# Patient Record
Sex: Male | Born: 1945
Health system: Southern US, Community
[De-identification: ages and names within clinical notes are randomized; demographics above are authoritative.]

## PROBLEM LIST (undated history)

## (undated) DIAGNOSIS — E079 Disorder of thyroid, unspecified: Secondary | ICD-10-CM

## (undated) DIAGNOSIS — I639 Cerebral infarction, unspecified: Secondary | ICD-10-CM

## (undated) DIAGNOSIS — J449 Chronic obstructive pulmonary disease, unspecified: Secondary | ICD-10-CM

## (undated) DIAGNOSIS — K219 Gastro-esophageal reflux disease without esophagitis: Secondary | ICD-10-CM

## (undated) DIAGNOSIS — J40 Bronchitis, not specified as acute or chronic: Secondary | ICD-10-CM

## (undated) DIAGNOSIS — I10 Essential (primary) hypertension: Secondary | ICD-10-CM

## (undated) DIAGNOSIS — M1990 Unspecified osteoarthritis, unspecified site: Secondary | ICD-10-CM

## (undated) DIAGNOSIS — I82409 Acute embolism and thrombosis of unspecified deep veins of unspecified lower extremity: Secondary | ICD-10-CM

## (undated) DIAGNOSIS — G459 Transient cerebral ischemic attack, unspecified: Secondary | ICD-10-CM

## (undated) DIAGNOSIS — E785 Hyperlipidemia, unspecified: Secondary | ICD-10-CM

## (undated) DIAGNOSIS — E039 Hypothyroidism, unspecified: Secondary | ICD-10-CM

## (undated) DIAGNOSIS — B351 Tinea unguium: Secondary | ICD-10-CM

## (undated) HISTORY — PX: TONSILLECTOMY: SUR1361

## (undated) HISTORY — DX: Cerebral infarction, unspecified: I63.9

## (undated) HISTORY — PX: APPENDECTOMY: SHX54

## (undated) HISTORY — DX: Unspecified osteoarthritis, unspecified site: M19.90

## (undated) HISTORY — PX: NASAL SINUS SURGERY: SHX719

## (undated) HISTORY — PX: PR VEIN BYPASS GRAFT,AORTO-FEM-POP: 35551

## (undated) HISTORY — PX: COLONOSCOPY: SHX174

## (undated) HISTORY — DX: Acute embolism and thrombosis of unspecified deep veins of unspecified lower extremity: I82.409

## (undated) HISTORY — PX: OTHER SURGICAL HISTORY: SHX169

## (undated) HISTORY — DX: Tinea unguium: B35.1

## (undated) HISTORY — DX: Hyperlipidemia, unspecified: E78.5

## (undated) HISTORY — DX: Disorder of thyroid, unspecified: E07.9

## (undated) HISTORY — DX: Transient cerebral ischemic attack, unspecified: G45.9

---

## 1999-04-06 ENCOUNTER — Ambulatory Visit (HOSPITAL_COMMUNITY): Admission: RE | Admit: 1999-04-06 | Discharge: 1999-04-06 | Payer: Self-pay | Admitting: Gastroenterology

## 1999-10-13 ENCOUNTER — Encounter: Payer: Self-pay | Admitting: Emergency Medicine

## 1999-10-13 ENCOUNTER — Inpatient Hospital Stay (HOSPITAL_COMMUNITY): Admission: EM | Admit: 1999-10-13 | Discharge: 1999-10-14 | Payer: Self-pay | Admitting: Orthopedic Surgery

## 1999-10-13 ENCOUNTER — Encounter: Payer: Self-pay | Admitting: Orthopedic Surgery

## 1999-11-13 ENCOUNTER — Inpatient Hospital Stay (HOSPITAL_COMMUNITY): Admission: EM | Admit: 1999-11-13 | Discharge: 1999-11-19 | Payer: Self-pay | Admitting: Orthopedic Surgery

## 1999-11-13 ENCOUNTER — Encounter: Payer: Self-pay | Admitting: Orthopedic Surgery

## 2000-05-02 ENCOUNTER — Ambulatory Visit (HOSPITAL_COMMUNITY): Admission: RE | Admit: 2000-05-02 | Discharge: 2000-05-02 | Payer: Self-pay | Admitting: Family Medicine

## 2000-08-07 ENCOUNTER — Encounter: Payer: Self-pay | Admitting: Vascular Surgery

## 2000-08-08 ENCOUNTER — Ambulatory Visit: Admission: RE | Admit: 2000-08-08 | Discharge: 2000-08-08 | Payer: Self-pay | Admitting: Vascular Surgery

## 2001-01-15 ENCOUNTER — Ambulatory Visit: Admission: RE | Admit: 2001-01-15 | Discharge: 2001-01-15 | Payer: Self-pay | Admitting: Vascular Surgery

## 2001-01-15 ENCOUNTER — Encounter: Payer: Self-pay | Admitting: Vascular Surgery

## 2001-01-17 ENCOUNTER — Encounter (HOSPITAL_COMMUNITY): Admission: RE | Admit: 2001-01-17 | Discharge: 2001-04-17 | Payer: Self-pay | Admitting: Family Medicine

## 2001-01-22 ENCOUNTER — Encounter: Admission: RE | Admit: 2001-01-22 | Discharge: 2001-04-22 | Payer: Self-pay | Admitting: Family Medicine

## 2002-12-13 ENCOUNTER — Inpatient Hospital Stay (HOSPITAL_COMMUNITY): Admission: EM | Admit: 2002-12-13 | Discharge: 2002-12-15 | Payer: Self-pay | Admitting: Emergency Medicine

## 2002-12-13 ENCOUNTER — Encounter: Payer: Self-pay | Admitting: Internal Medicine

## 2005-02-19 ENCOUNTER — Emergency Department (HOSPITAL_COMMUNITY): Admission: EM | Admit: 2005-02-19 | Discharge: 2005-02-19 | Payer: Self-pay | Admitting: Family Medicine

## 2005-02-21 ENCOUNTER — Encounter: Admission: RE | Admit: 2005-02-21 | Discharge: 2005-02-21 | Payer: Self-pay | Admitting: Occupational Medicine

## 2005-03-16 ENCOUNTER — Inpatient Hospital Stay (HOSPITAL_COMMUNITY): Admission: EM | Admit: 2005-03-16 | Discharge: 2005-03-19 | Payer: Self-pay | Admitting: Emergency Medicine

## 2005-05-10 ENCOUNTER — Ambulatory Visit (HOSPITAL_COMMUNITY): Admission: RE | Admit: 2005-05-10 | Discharge: 2005-05-10 | Payer: Self-pay | Admitting: Gastroenterology

## 2005-05-15 ENCOUNTER — Emergency Department (HOSPITAL_COMMUNITY): Admission: EM | Admit: 2005-05-15 | Discharge: 2005-05-16 | Payer: Self-pay | Admitting: Emergency Medicine

## 2005-08-08 ENCOUNTER — Ambulatory Visit (HOSPITAL_COMMUNITY): Admission: RE | Admit: 2005-08-08 | Discharge: 2005-08-08 | Payer: Self-pay | Admitting: Vascular Surgery

## 2005-08-11 ENCOUNTER — Inpatient Hospital Stay (HOSPITAL_COMMUNITY): Admission: RE | Admit: 2005-08-11 | Discharge: 2005-08-17 | Payer: Self-pay | Admitting: Vascular Surgery

## 2006-12-25 ENCOUNTER — Inpatient Hospital Stay (HOSPITAL_COMMUNITY): Admission: EM | Admit: 2006-12-25 | Discharge: 2006-12-29 | Payer: Self-pay | Admitting: Emergency Medicine

## 2008-01-24 ENCOUNTER — Inpatient Hospital Stay (HOSPITAL_COMMUNITY): Admission: EM | Admit: 2008-01-24 | Discharge: 2008-02-01 | Payer: Self-pay | Admitting: Emergency Medicine

## 2008-01-24 ENCOUNTER — Ambulatory Visit: Payer: Self-pay | Admitting: Critical Care Medicine

## 2009-02-25 IMAGING — CR DG CHEST 2V
2 series · 2 of 2 positions shown · non-contrast
Comparison: 12/25/2006

CLINICAL DATA: Cough. Dyspnea. Ex-smoker.

CHEST - 2 VIEW

[w chest pa]
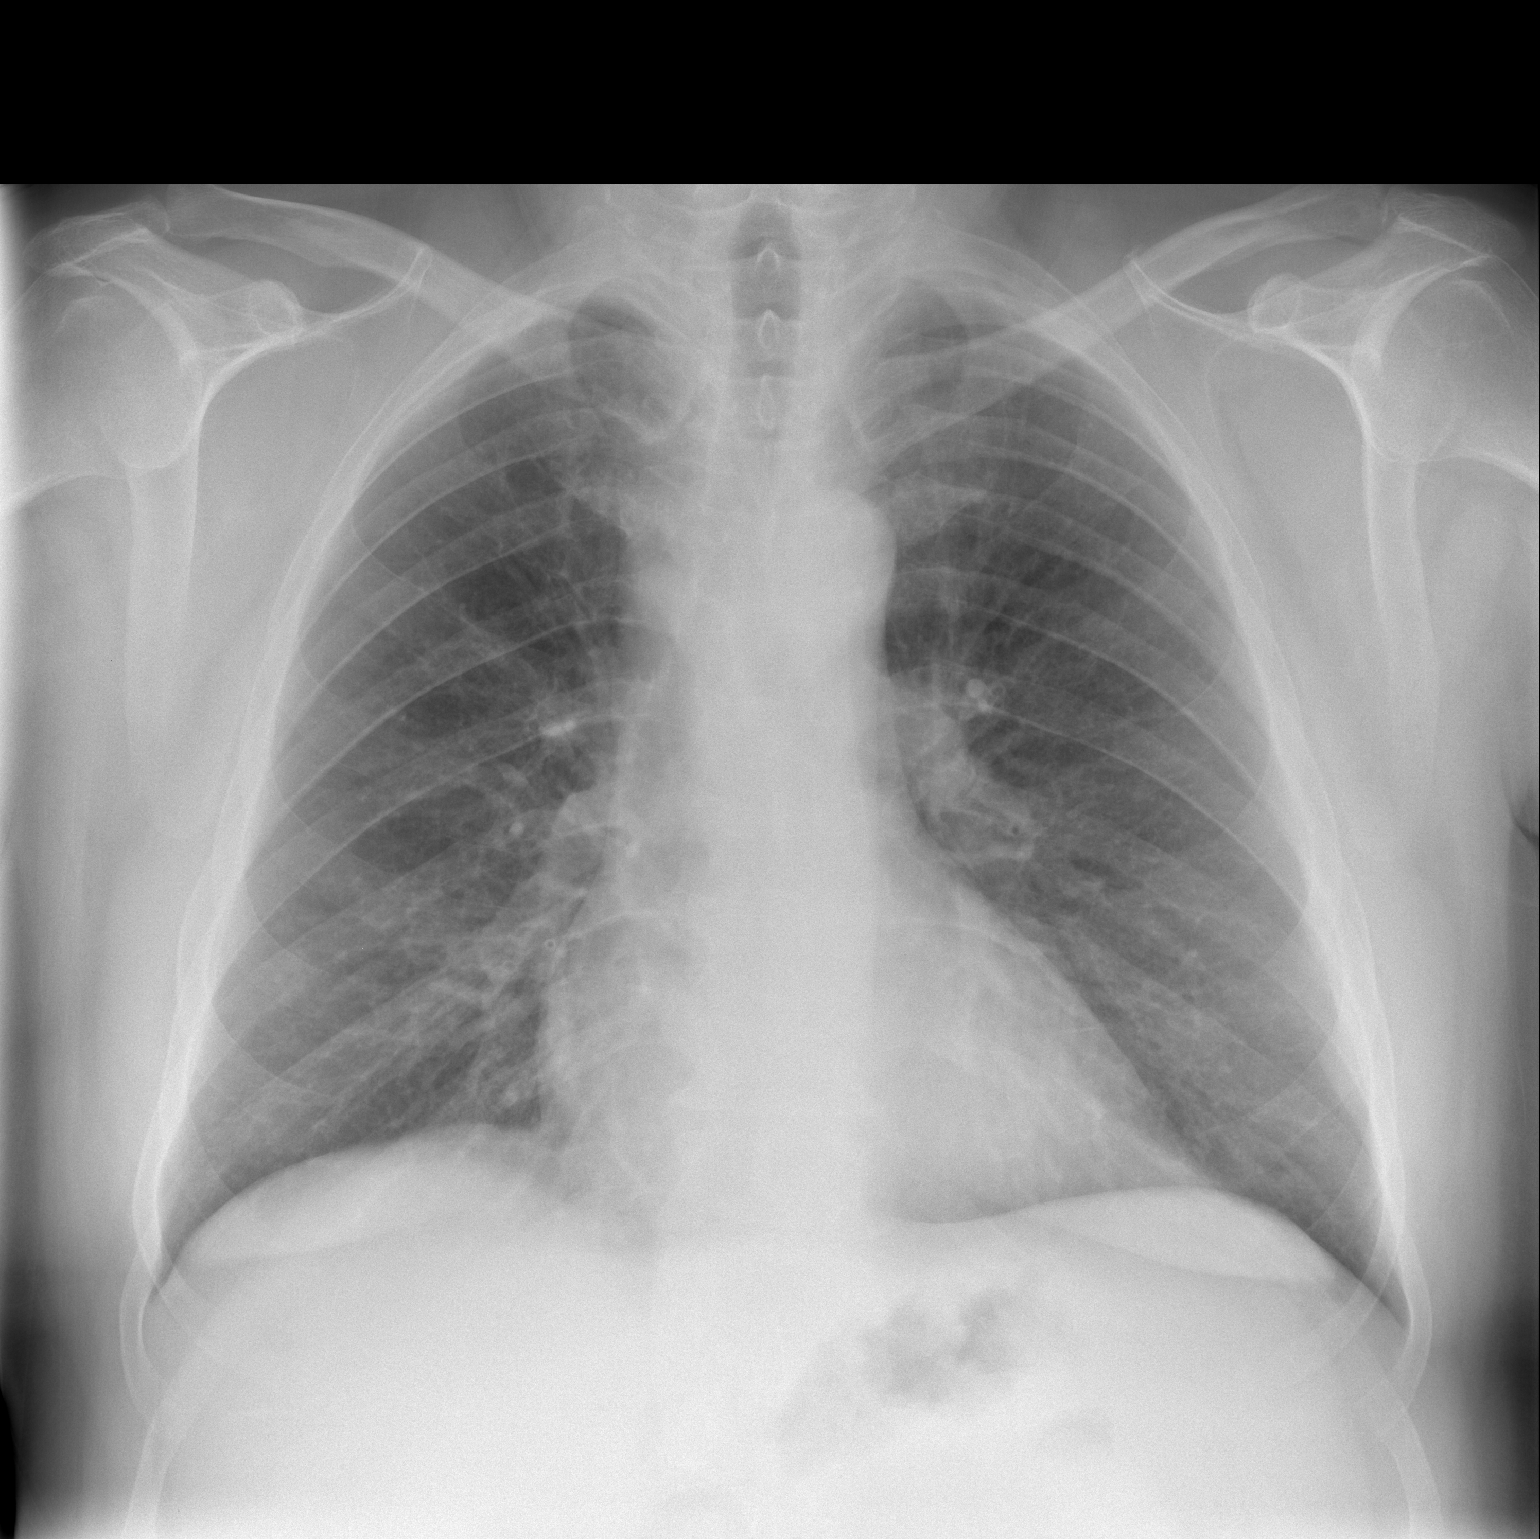

[w chest lat]
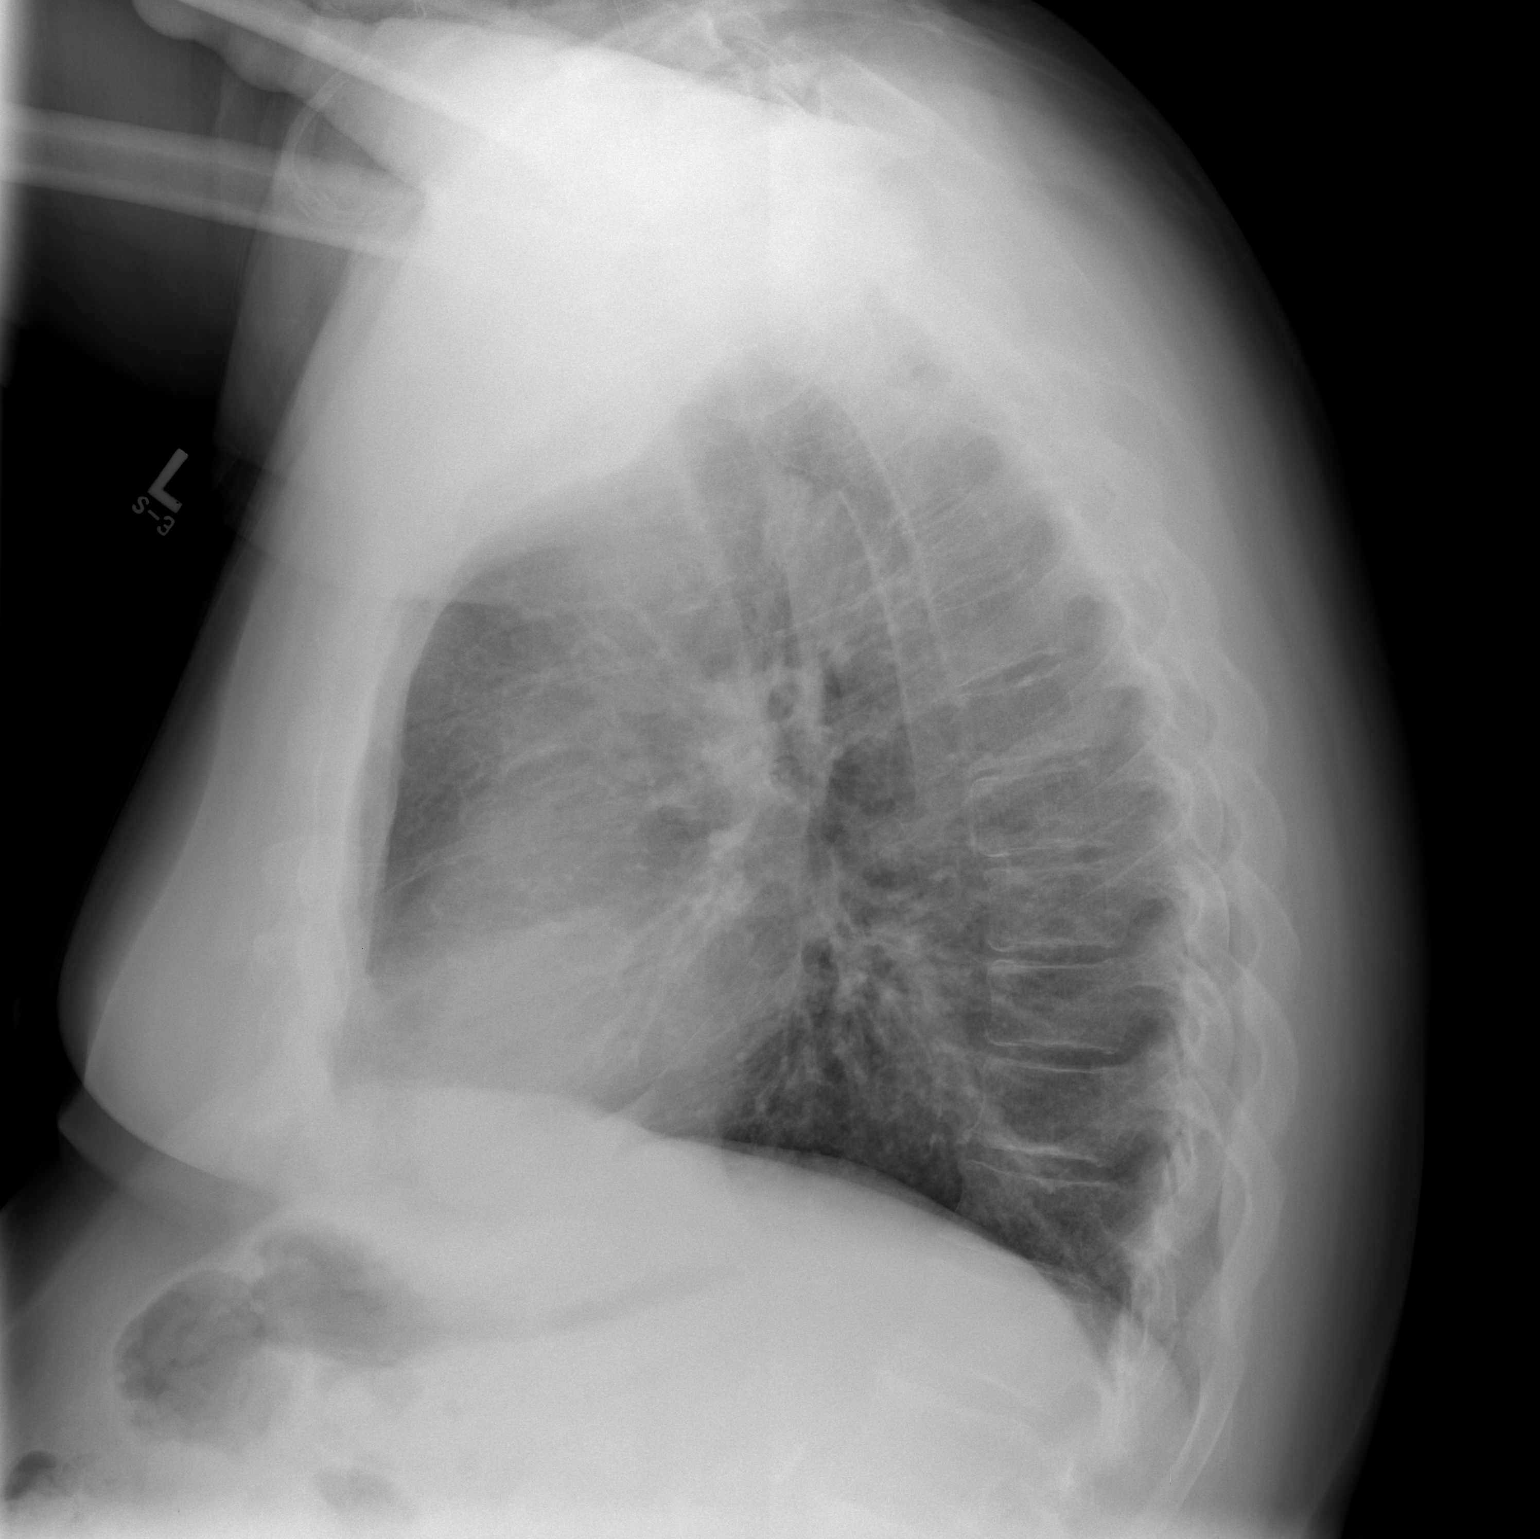

[2 of 2 positions shown; findings below may reference images not displayed]

FINDINGS: Midline trachea. Normal heart size and mediastinal contours. No
pleural effusion or pneumothorax. Mild peribronchial thickening. Clear lungs.

IMPRESSION

1. No acute cardiopulmonary disease.
2. Mild peribronchial thickening likely related to chronic bronchitis or
smoking.

## 2009-12-12 HISTORY — PX: JOINT REPLACEMENT: SHX530

## 2010-03-25 ENCOUNTER — Encounter: Admission: RE | Admit: 2010-03-25 | Discharge: 2010-03-25 | Payer: Self-pay | Admitting: Family Medicine

## 2011-01-28 ENCOUNTER — Emergency Department (HOSPITAL_COMMUNITY): Payer: Medicare Other

## 2011-01-28 ENCOUNTER — Encounter (HOSPITAL_COMMUNITY): Payer: Self-pay | Admitting: Radiology

## 2011-01-28 ENCOUNTER — Inpatient Hospital Stay (HOSPITAL_COMMUNITY)
Admission: EM | Admit: 2011-01-28 | Discharge: 2011-01-31 | DRG: 316 | Disposition: A | Discharge status: Home / Self Care | Payer: Medicare Other | Source: Ambulatory Visit | Attending: Internal Medicine | Admitting: Internal Medicine

## 2011-01-28 DIAGNOSIS — Z23 Encounter for immunization: Secondary | ICD-10-CM

## 2011-01-28 DIAGNOSIS — E119 Type 2 diabetes mellitus without complications: Secondary | ICD-10-CM | POA: Diagnosis present

## 2011-01-28 DIAGNOSIS — E669 Obesity, unspecified: Secondary | ICD-10-CM | POA: Diagnosis present

## 2011-01-28 DIAGNOSIS — I429 Cardiomyopathy, unspecified: Principal | ICD-10-CM | POA: Diagnosis present

## 2011-01-28 DIAGNOSIS — I509 Heart failure, unspecified: Secondary | ICD-10-CM | POA: Diagnosis present

## 2011-01-28 DIAGNOSIS — I672 Cerebral atherosclerosis: Secondary | ICD-10-CM | POA: Diagnosis present

## 2011-01-28 DIAGNOSIS — E039 Hypothyroidism, unspecified: Secondary | ICD-10-CM | POA: Diagnosis present

## 2011-01-28 DIAGNOSIS — I739 Peripheral vascular disease, unspecified: Secondary | ICD-10-CM | POA: Diagnosis present

## 2011-01-28 DIAGNOSIS — J449 Chronic obstructive pulmonary disease, unspecified: Secondary | ICD-10-CM | POA: Diagnosis present

## 2011-01-28 DIAGNOSIS — Z7982 Long term (current) use of aspirin: Secondary | ICD-10-CM

## 2011-01-28 DIAGNOSIS — E785 Hyperlipidemia, unspecified: Secondary | ICD-10-CM | POA: Diagnosis present

## 2011-01-28 DIAGNOSIS — J4489 Other specified chronic obstructive pulmonary disease: Secondary | ICD-10-CM | POA: Diagnosis present

## 2011-01-28 DIAGNOSIS — F17200 Nicotine dependence, unspecified, uncomplicated: Secondary | ICD-10-CM | POA: Diagnosis present

## 2011-01-28 DIAGNOSIS — I10 Essential (primary) hypertension: Secondary | ICD-10-CM | POA: Diagnosis present

## 2011-01-28 DIAGNOSIS — I6529 Occlusion and stenosis of unspecified carotid artery: Secondary | ICD-10-CM | POA: Diagnosis present

## 2011-01-28 HISTORY — DX: Chronic obstructive pulmonary disease, unspecified: J44.9

## 2011-01-28 HISTORY — DX: Essential (primary) hypertension: I10

## 2011-01-28 LAB — CBC
HCT: 43.3 % (ref 39.0–52.0)
Hemoglobin: 14.1 g/dL (ref 13.0–17.0)
MCH: 27 pg (ref 26.0–34.0)
MCHC: 32.6 g/dL (ref 30.0–36.0)
MCV: 83 fL (ref 78.0–100.0)
Platelets: 189 10*3/uL (ref 150–400)
RBC: 5.22 MIL/uL (ref 4.22–5.81)
RDW: 14.7 % (ref 11.5–15.5)
WBC: 9.6 10*3/uL (ref 4.0–10.5)

## 2011-01-28 LAB — BASIC METABOLIC PANEL
BUN: 17 mg/dL (ref 6–23)
CO2: 23 mEq/L (ref 19–32)
Calcium: 9.6 mg/dL (ref 8.4–10.5)
Chloride: 107 mEq/L (ref 96–112)
Creatinine, Ser: 1.2 mg/dL (ref 0.4–1.5)
GFR calc Af Amer: >60 mL/min (ref >60)
GFR calc non Af Amer: >60 mL/min (ref >60)
Glucose, Bld: 177 mg/dL — ABNORMAL HIGH (ref 70–99)
Potassium: 4.7 mEq/L (ref 3.5–5.1)
Sodium: 142 mEq/L (ref 135–145)

## 2011-01-28 LAB — DIFFERENTIAL
Basophils Absolute: 0.1 10*3/uL (ref 0.0–0.1)
Basophils Relative: 1 % (ref 0–1)
Eosinophils Absolute: 0.3 10*3/uL (ref 0.0–0.7)
Eosinophils Relative: 3 % (ref 0–5)
Lymphocytes Relative: 34 % (ref 12–46)
Lymphs Abs: 3.2 10*3/uL (ref 0.7–4.0)
Monocytes Absolute: 0.8 10*3/uL (ref 0.1–1.0)
Monocytes Relative: 8 % (ref 3–12)
Neutro Abs: 5.2 10*3/uL (ref 1.7–7.7)
Neutrophils Relative %: 55 % (ref 43–77)

## 2011-01-28 LAB — GLUCOSE, CAPILLARY
Glucose-Capillary: 129 mg/dL — ABNORMAL HIGH (ref 70–99)
Glucose-Capillary: 97 mg/dL (ref 70–99)

## 2011-01-28 LAB — CK TOTAL AND CKMB (NOT AT ARMC)
CK, MB: 3 ng/mL (ref 0.3–4.0)
Relative Index: INVALID (ref 0.0–2.5)
Total CK: 72 U/L (ref 7–232)

## 2011-01-28 LAB — TROPONIN I: Troponin I: 0.01 ng/mL (ref 0.00–0.06)

## 2011-01-28 MED ORDER — IOHEXOL 350 MG/ML SOLN
100.0000 mL | Freq: Once | INTRAVENOUS | Status: AC | PRN
  Administered 2011-01-28: 100 mL via INTRAVENOUS

## 2011-01-29 LAB — GLUCOSE, CAPILLARY
Glucose-Capillary: 130 mg/dL — ABNORMAL HIGH (ref 70–99)
Glucose-Capillary: 159 mg/dL — ABNORMAL HIGH (ref 70–99)
Glucose-Capillary: 297 mg/dL — ABNORMAL HIGH (ref 70–99)
Glucose-Capillary: 317 mg/dL — ABNORMAL HIGH (ref 70–99)
Glucose-Capillary: 60 mg/dL — ABNORMAL LOW (ref 70–99)
Glucose-Capillary: 84 mg/dL (ref 70–99)

## 2011-01-29 LAB — HEMOGLOBIN A1C
Hgb A1c MFr Bld: 10.4 % — ABNORMAL HIGH (ref <5.7)
Mean Plasma Glucose: 252 mg/dL — ABNORMAL HIGH (ref <117)

## 2011-01-29 LAB — COMPREHENSIVE METABOLIC PANEL
ALT: 25 U/L (ref 0–53)
AST: 21 U/L (ref 0–37)
Albumin: 3.2 g/dL — ABNORMAL LOW (ref 3.5–5.2)
Alkaline Phosphatase: 74 U/L (ref 39–117)
BUN: 13 mg/dL (ref 6–23)
CO2: 27 mEq/L (ref 19–32)
Calcium: 9.2 mg/dL (ref 8.4–10.5)
Chloride: 107 mEq/L (ref 96–112)
Creatinine, Ser: 1.21 mg/dL (ref 0.4–1.5)
GFR calc Af Amer: >60 mL/min (ref >60)
GFR calc non Af Amer: >60 mL/min (ref >60)
Glucose, Bld: 255 mg/dL — ABNORMAL HIGH (ref 70–99)
Potassium: 4.7 mEq/L (ref 3.5–5.1)
Sodium: 141 mEq/L (ref 135–145)
Total Bilirubin: 0.2 mg/dL — ABNORMAL LOW (ref 0.3–1.2)
Total Protein: 5.9 g/dL — ABNORMAL LOW (ref 6.0–8.3)

## 2011-01-29 LAB — CARDIAC PANEL(CRET KIN+CKTOT+MB+TROPI)
CK, MB: 2.8 ng/mL (ref 0.3–4.0)
CK, MB: 2.4 ng/mL (ref 0.3–4.0)
Relative Index: INVALID (ref 0.0–2.5)
Relative Index: INVALID (ref 0.0–2.5)
Total CK: 76 U/L (ref 7–232)
Total CK: 59 U/L (ref 7–232)
Troponin I: 0.01 ng/mL (ref 0.00–0.06)
Troponin I: 0.01 ng/mL (ref 0.00–0.06)

## 2011-01-29 LAB — CBC
HCT: 40.3 % (ref 39.0–52.0)
Hemoglobin: 12.8 g/dL — ABNORMAL LOW (ref 13.0–17.0)
MCH: 26.7 pg (ref 26.0–34.0)
MCHC: 31.8 g/dL (ref 30.0–36.0)
MCV: 84 fL (ref 78.0–100.0)
Platelets: 184 10*3/uL (ref 150–400)
RBC: 4.8 MIL/uL (ref 4.22–5.81)
RDW: 14.9 % (ref 11.5–15.5)
WBC: 10.6 10*3/uL — ABNORMAL HIGH (ref 4.0–10.5)

## 2011-01-29 LAB — LIPID PANEL
Cholesterol: 117 mg/dL (ref 0–200)
HDL: 35 mg/dL — ABNORMAL LOW (ref >39)
LDL Cholesterol: 54 mg/dL (ref 0–99)
Total CHOL/HDL Ratio: 3.3 RATIO
Triglycerides: 142 mg/dL (ref <150)
VLDL: 28 mg/dL (ref 0–40)

## 2011-01-29 LAB — PROTIME-INR
INR: 1.01 (ref 0.00–1.49)
Prothrombin Time: 13.5 seconds (ref 11.6–15.2)

## 2011-01-29 LAB — APTT: aPTT: 30 seconds (ref 24–37)

## 2011-01-30 LAB — GLUCOSE, CAPILLARY
Glucose-Capillary: 258 mg/dL — ABNORMAL HIGH (ref 70–99)
Glucose-Capillary: 296 mg/dL — ABNORMAL HIGH (ref 70–99)
Glucose-Capillary: 395 mg/dL — ABNORMAL HIGH (ref 70–99)
Glucose-Capillary: 382 mg/dL — ABNORMAL HIGH (ref 70–99)
Glucose-Capillary: 404 mg/dL — ABNORMAL HIGH (ref 70–99)
Glucose-Capillary: 341 mg/dL — ABNORMAL HIGH (ref 70–99)

## 2011-01-31 LAB — GLUCOSE, CAPILLARY
Glucose-Capillary: 230 mg/dL — ABNORMAL HIGH (ref 70–99)
Glucose-Capillary: 282 mg/dL — ABNORMAL HIGH (ref 70–99)
Glucose-Capillary: 301 mg/dL — ABNORMAL HIGH (ref 70–99)
Glucose-Capillary: 266 mg/dL — ABNORMAL HIGH (ref 70–99)
Glucose-Capillary: 259 mg/dL — ABNORMAL HIGH (ref 70–99)

## 2011-02-08 ENCOUNTER — Emergency Department (HOSPITAL_COMMUNITY): Payer: Medicare Other

## 2011-02-08 ENCOUNTER — Inpatient Hospital Stay (HOSPITAL_COMMUNITY)
Admission: EM | Admit: 2011-02-08 | Discharge: 2011-02-10 | DRG: 055 | Disposition: A | Discharge status: Home / Self Care | Payer: Medicare Other | Attending: Neurology | Admitting: Neurology

## 2011-02-08 DIAGNOSIS — E119 Type 2 diabetes mellitus without complications: Secondary | ICD-10-CM | POA: Diagnosis present

## 2011-02-08 DIAGNOSIS — J4489 Other specified chronic obstructive pulmonary disease: Secondary | ICD-10-CM | POA: Diagnosis present

## 2011-02-08 DIAGNOSIS — I10 Essential (primary) hypertension: Secondary | ICD-10-CM | POA: Diagnosis present

## 2011-02-08 DIAGNOSIS — F17200 Nicotine dependence, unspecified, uncomplicated: Secondary | ICD-10-CM | POA: Diagnosis present

## 2011-02-08 DIAGNOSIS — J449 Chronic obstructive pulmonary disease, unspecified: Secondary | ICD-10-CM | POA: Diagnosis present

## 2011-02-08 DIAGNOSIS — D320 Benign neoplasm of cerebral meninges: Secondary | ICD-10-CM | POA: Diagnosis present

## 2011-02-08 DIAGNOSIS — E039 Hypothyroidism, unspecified: Secondary | ICD-10-CM | POA: Diagnosis present

## 2011-02-08 DIAGNOSIS — D333 Benign neoplasm of cranial nerves: Principal | ICD-10-CM | POA: Diagnosis present

## 2011-02-08 DIAGNOSIS — R279 Unspecified lack of coordination: Secondary | ICD-10-CM | POA: Diagnosis present

## 2011-02-08 DIAGNOSIS — F909 Attention-deficit hyperactivity disorder, unspecified type: Secondary | ICD-10-CM | POA: Diagnosis present

## 2011-02-08 DIAGNOSIS — E785 Hyperlipidemia, unspecified: Secondary | ICD-10-CM | POA: Diagnosis present

## 2011-02-08 DIAGNOSIS — G459 Transient cerebral ischemic attack, unspecified: Secondary | ICD-10-CM | POA: Diagnosis present

## 2011-02-08 LAB — URINALYSIS, ROUTINE W REFLEX MICROSCOPIC
Bilirubin Urine: NEGATIVE
Hgb urine dipstick: NEGATIVE
Ketones, ur: NEGATIVE mg/dL
Nitrite: NEGATIVE
Protein, ur: NEGATIVE mg/dL
Specific Gravity, Urine: 1.015 (ref 1.005–1.030)
Urine Glucose, Fasting: NEGATIVE mg/dL
Urobilinogen, UA: 0.2 mg/dL (ref 0.0–1.0)
pH: 5 (ref 5.0–8.0)

## 2011-02-08 LAB — CK TOTAL AND CKMB (NOT AT ARMC)
CK, MB: 3 ng/mL (ref 0.3–4.0)
Relative Index: INVALID (ref 0.0–2.5)
Total CK: 62 U/L (ref 7–232)

## 2011-02-08 LAB — CBC
HCT: 41.8 % (ref 39.0–52.0)
Hemoglobin: 13.7 g/dL (ref 13.0–17.0)
MCH: 26.8 pg (ref 26.0–34.0)
MCHC: 32.8 g/dL (ref 30.0–36.0)
MCV: 81.6 fL (ref 78.0–100.0)
Platelets: 204 10*3/uL (ref 150–400)
RBC: 5.12 MIL/uL (ref 4.22–5.81)
RDW: 14.3 % (ref 11.5–15.5)
WBC: 9.7 10*3/uL (ref 4.0–10.5)

## 2011-02-08 LAB — COMPREHENSIVE METABOLIC PANEL
ALT: 21 U/L (ref 0–53)
AST: 16 U/L (ref 0–37)
Albumin: 3.7 g/dL (ref 3.5–5.2)
Alkaline Phosphatase: 97 U/L (ref 39–117)
BUN: 24 mg/dL — ABNORMAL HIGH (ref 6–23)
CO2: 25 mEq/L (ref 19–32)
Calcium: 9.3 mg/dL (ref 8.4–10.5)
Chloride: 107 mEq/L (ref 96–112)
Creatinine, Ser: 1.21 mg/dL (ref 0.4–1.5)
GFR calc Af Amer: >60 mL/min (ref >60)
GFR calc non Af Amer: >60 mL/min (ref >60)
Glucose, Bld: 193 mg/dL — ABNORMAL HIGH (ref 70–99)
Potassium: 4.9 mEq/L (ref 3.5–5.1)
Sodium: 139 mEq/L (ref 135–145)
Total Bilirubin: 0.6 mg/dL (ref 0.3–1.2)
Total Protein: 6.4 g/dL (ref 6.0–8.3)

## 2011-02-08 LAB — TROPONIN I: Troponin I: <0.01 ng/mL (ref 0.00–0.06)

## 2011-02-08 LAB — APTT: aPTT: 31 seconds (ref 24–37)

## 2011-02-08 LAB — HEMOGLOBIN A1C
Hgb A1c MFr Bld: 10 % — ABNORMAL HIGH (ref <5.7)
Mean Plasma Glucose: 240 mg/dL — ABNORMAL HIGH (ref <117)

## 2011-02-08 LAB — GLUCOSE, CAPILLARY: Glucose-Capillary: 155 mg/dL — ABNORMAL HIGH (ref 70–99)

## 2011-02-08 MED ORDER — GADOBENATE DIMEGLUMINE 529 MG/ML IV SOLN: 20.0000 mL | Freq: Once | INTRAVENOUS | Status: DC

## 2011-02-09 LAB — PROTIME-INR
INR: 1.05 (ref 0.00–1.49)
Prothrombin Time: 13.9 seconds (ref 11.6–15.2)

## 2011-02-09 LAB — LIPID PANEL
Cholesterol: 111 mg/dL (ref 0–200)
HDL: 34 mg/dL — ABNORMAL LOW (ref >39)
LDL Cholesterol: 50 mg/dL (ref 0–99)
Total CHOL/HDL Ratio: 3.3 RATIO
Triglycerides: 136 mg/dL (ref <150)
VLDL: 27 mg/dL (ref 0–40)

## 2011-02-09 LAB — GLUCOSE, CAPILLARY
Glucose-Capillary: 89 mg/dL (ref 70–99)
Glucose-Capillary: 206 mg/dL — ABNORMAL HIGH (ref 70–99)
Glucose-Capillary: 204 mg/dL — ABNORMAL HIGH (ref 70–99)
Glucose-Capillary: 137 mg/dL — ABNORMAL HIGH (ref 70–99)
Glucose-Capillary: 245 mg/dL — ABNORMAL HIGH (ref 70–99)

## 2011-02-10 LAB — GLUCOSE, CAPILLARY
Glucose-Capillary: 197 mg/dL — ABNORMAL HIGH (ref 70–99)
Glucose-Capillary: 258 mg/dL — ABNORMAL HIGH (ref 70–99)

## 2011-02-10 NOTE — Consult Note (Signed)
NAME:  Sean Saunders, Sean Saunders               ACCOUNT NO.:  [id number]  MEDICAL RECORD NO.:  [id number]           PATIENT TYPE:  I  LOCATION:  3021                         FACILITY:  [facility]  PHYSICIAN:  Denim Start P. Pearlean Brownie, MD    DATE OF BIRTH:  1946/05/31  DATE OF CONSULTATION: DATE OF DISCHARGE:                                CONSULTATION   REFERRING PHYSICIAN:  Dione Booze, MD  REASON FOR REFERRAL:  Stroke.  HISTORY OF PRESENT ILLNESS:  Mr. Strick is a 65 year old Caucasian gentleman who developed sudden onset of severe nausea, dizziness, blurred vision, right-sided weakness, incoordination at around noon today.  He is having waves of nausea and is having blurred vision and difficulty with walking, staggering having to hold on with right-sided weakness.  He denies any significant headache, vertigo, or diplopia.  He has no prior history of stroke, TIA, seizures, significant neurological problems.  He did check his sugar which was in the 200 range when symptoms began as well as blood pressure which was only mildly elevated.  PAST MEDICAL HISTORY:  Significant for diabetes, hypertension, hyperlipidemia, hypothyroidism, congestive heart failure.  HOME MEDICATIONS:  Advair, amoxicillin, aspirin, Avapro, Bystolic, Glucophage, Humulin, Januvia, Lasix, Norvasc, Prilosec, Spiriva, Synthroid, Zetia, Zocor, and zolpidem.  SOCIAL HISTORY:  The patient is married, lives with his wife, does not smoke or drink.  REVIEW OF SYSTEMS:  Positive for nausea, vomiting, dizziness, gait ataxia, weakness.  PHYSICAL EXAMINATION:  GENERAL:  An obese middle-aged Caucasian male who is currently in distress due to nausea. VITAL SIGNS:  He is afebrile, temperature 97.4, blood pressure 142/62, pulse rate 59 per minute, respiratory rate 18 per minute, oxygen sats 95% on room air. HEENT:  Head is nontraumatic. NECK:  Supple. CARDIAC:  No murmur or gallop. LUNGS:  Clear to auscultation. NEUROLOGIC:  He is awake,  alert.  He is oriented to time, place, and person.  Speech and language appears normal.  His eye movements are full range, but there is slow saccades to the right.  He blinks to threat bilaterally.  Visual fields appear full.  Face is symmetric.  Tongue is midline.  Motor system exam reveals mild right lower extremity drift and weakness of her right hip flexors.  He has no upper extremity drift.  He has symmetric strength in the upper extremities.  There is mild finger- to-nose dysmetria, right more than left.  There is no sensory loss. Gait was not tested.  DATA REVIEWED:  MRI scan of the brain done today reveals no definite acute infarct.  The flow of the large vessels of the anterior and posterior circulation appeared to be patent. CT scan of the head shows no acute abnormalities.  LABORATORY DATA:  WBC count and electrolytes are normal.  IMPRESSION:  This is a 65 year old gentleman with sudden onset of severe nausea and dizziness, blurred vision, right-sided weakness, and ataxia, likely due to small brainstem infarct, not visualized on the MRI. Multiple risk factors for small vessel disease in the form of diabetes, hypertension, hyperlipidemia, obesity.  PLAN:  The patient will be admitted for further stroke workup. Symptomatically, treat nausea with Phenergan or  Zofran.  Check CT angiogram of brain and neck as well as echocardiogram, Doppler studies, fasting lipid profile, hemoglobin A1c.  Change aspirin to Plavix for secondary stroke prevention.  Physical, occupational, speech therapy consults.  I will be happy to follow the patient in consult.  Kindly call for questions.     Senaida Chilcote P. Pearlean Brownie, MD     PPS/MEDQ  D:  01/28/2011  T:  01/29/2011  Job:  [id number]  Electronically Signed by Delia Heady MD on 02/10/2011 01:21:39 PM

## 2011-02-10 NOTE — Discharge Summary (Signed)
NAME:  Sean Saunders, Sean Saunders               ACCOUNT NO.:  [id number]  MEDICAL RECORD NO.:  [id number]           PATIENT TYPE:  I  LOCATION:  3021                         FACILITY:  [facility]  PHYSICIAN:  Jeoffrey Massed, MD    DATE OF BIRTH:  30-Apr-1946  DATE OF ADMISSION:  01/28/2011 DATE OF DISCHARGE:                        DISCHARGE SUMMARY - REFERRING   PRIMARY DISCHARGE DIAGNOSIS:  Posterior circulation transient ischemic attack.  SECONDARY DISCHARGE DIAGNOSES: 1. Hypertension. 2. Diabetes. 3. Hypothyroidism. 4. Dyslipidemia. 5. Chronic obstructive pulmonary disease. 6. Hypothyroidism.  CONSULTATIONS:  Dr. Pearlean Brownie from Neurology.  BRIEF HISTORY OF PRESENT ILLNESS:  The patient is a very pleasant 65- year-old gentleman with a past medical history of diabetes, hypertension, dyslipidemia, chronic obstructive pulmonary disease who came in on 01/28/2011 with nausea, vomiting, and weakness on the right side.  He was then admitted to the hospital service for further evaluation and treatment.  For further details, please see the history and physical that was dictated by Dr. Adela Glimpse on admission.  PERTINENT RADIOLOGICAL STUDIES: 1. CT of the head done on 01/28/2011 was unremarkable. 2. MRI of the head without contrast was negative for acute infarct.     There was a 10 x 12 mm right frontal interhemispheric meningioma     without significant brain edema. 3. CT angiogram of the head showed atherosclerotic calcification of     the cavernous carotid arteries bilaterally, worse on the right.  No     significant stenosis.  No other significant proximal stenosis,     aneurysm, or branch vessel occlusion. 4. Carotid duplex ultrasound preliminary result shows no stenosis on     the right.  However, the left ICA demonstrated a 40-59% stenosis.  PERTINENT LABORATORY DATA: 1. HbA1c is 10.4. 2. LDL cholesterol was 54. 3. Cardiac enzymes were cycled and these were negative.  2D echocardiogram  showed EF around 60-65% with no regional wall motion abnormalities.  No cardiac source of embolism was identified, but cannot be ruled out on the basis of this examination.  BRIEF HOSPITAL COURSE: 1. Posterior circulation transient ischemic attack.  The patient came     in with symptoms suggestive of posterior circulation transient     ischemic attack and he had nausea, vomiting, and right-sided     weakness.  The patient also had profound dizziness.  Radiological     studies are noted as above.  He was seen in consultation by Dr.     Pearlean Brownie from [facility].  His symptoms resolved over the     course of the next 24 hours after hospitalization.  It is at this     time felt that his symptoms are more likely than a posterior     circulation transient ischemic attack.  He has been switched from     aspirin to Plavix.  Rest of his medications are noted as above.  He     will need aggressive risk factor management. 2. Diabetes.  The patient is on the above-noted medications.  He     claims he was seen an endocrinologist in the past.  He claims that  his primary care practitioner, Dr. Dorothyann Peng has been managing     his sugars for the past few years.  He does not at this point want     need to adjust his medications.  He will be interested in being     referred to an endocrinologist as well.  I have given him Dr.     Tonita Cong number for him to call and make an appointment for     further optimization of his diabetic regimen as his HbA1c is still     10. 3. Hypertension.  This is controlled.  He is to continue his usual     medications. 4. Dyslipidemia.  He is to continue on Zocor and Zetia. 5. Hypothyroidism.  He is to continue levothyroxine. 6. Chronic obstructive pulmonary disease was stable. 7. Carotid artery stenosis.  The preliminary carotid Duplex ultrasound     shows around 40-59% stenosis of his left carotid arteries.  He has     seen Dr. Edilia Bo from vein and  vascular surgery in the past.  He     has been given the number for Dr. Edilia Bo to call and make an     appointment as well for surveillance and follow up his carotid     stenosis.  DISPOSITION:  The patient will be discharged home.  FOLLOWUP INSTRUCTIONS: 1. The patient to call and make an appointment with his primary care     practitioner, Dr. Dorothyann Peng within one week's time. 2. The patient is to call and make an appointment with Dr. Waverly Ferrari from vein and vascular services.  The number has been     provided to the patient. 3. The patient is also suggested that he call Dr. Talmage Coin from     [facility] and he is to call and make an appointment.  A     number has been left in the pink chart as well.  TOTAL TIME SPENT:  45 minutes.     Jeoffrey Massed, MD     SG/MEDQ  D:  01/31/2011  T:  01/31/2011  Job:  [id number]  cc:   Candyce Churn. Allyne Gee, M.D. Pramod P. Pearlean Brownie, MD Di Kindle. Edilia Bo, M.D. Tonita Cong, M.D.  Electronically Signed by Jeoffrey Massed  on 02/10/2011 03:43:22 PM

## 2011-02-13 NOTE — H&P (Signed)
NAME:  Sean Saunders, Sean Saunders               ACCOUNT NO.:  [id number]  MEDICAL RECORD NO.:  [id number]           PATIENT TYPE:  E  LOCATION:  [facility]                         FACILITY:  [facility]  PHYSICIAN:  Michiel Cowboy, MDDATE OF BIRTH:  Dec 14, 1945  DATE OF ADMISSION:  01/28/2011 DATE OF DISCHARGE:                             HISTORY & PHYSICAL   PRIMARY CARE PROVIDER:  Candyce Churn. Allyne Gee, MD, with [facility].  CHIEF COMPLAINT:  Nausea, vomiting, feeling like he is about to pass out and leaning to the right.  The patient is a 65 year old gentleman with past medical history significant for diabetes and hypertension who around 10 p.m. last night developed sudden onset of lightheadedness which came in waves.  He went to bed.  In the morning, he felt okay up, until maybe until noon.  He started to have repeated episodes and also one more significant episode where he felt very, very weak overall and sweaty with severe presyncopal- like episode.  Thereafter, he started to develop trouble walking, leaning constantly to the right and vertigo-like symptoms with nausea and vomiting as well as severe headache.  He was brought into the emergency department in ED.  He was seen by Neurology who thinks that despite a normal CT scan and normal MRI he likely had a very small basal stroke.  REVIEW OF SYSTEMS:  He does endorse slight chest pain which was very transient and gone away.  No shortness of breath.  No fevers.  No chills.  He does endorse headache, ataxia, presyncope, vertigo, nausea, and vomiting.  Past medical history is significant for: 1. Heart failure. 2. Diabetes mellitus. 3. Hyperlipidemia. 4. Hypertension. 5. Hypothyroidism. 6. COPD.  SOCIAL HISTORY:  The patient continues to smoke, although wants to quit. Does not drink or abuse drugs.  FAMILY HISTORY:  Significant for mother TIAs.  Sister with diabetes, hypertension, and a stroke.  ALLERGIES:  NIASPAN and  LUNESTA.  MEDICATIONS: 1. Advair 250/50 mg b.i.d. 2. Aspirin 81 mg daily. 3. Avapro 300 mg daily. 4. Bystolic 10 mg daily. 5. Glucophage 1000 mg b.i.d. 6. Humulin regular, he takes 35 units in the morning, 5 units at     night. 7. Januvia 100 mg daily. 8. Lasix 40 mg in the morning. 9. Norvasc 5 mg daily. 10.Prilosec 20 mg b.i.d. 11.Spiriva 18 mcg daily. 12.Synthroid 137 mcg daily. 13.Zetia 10 mg daily. 14.Zocor 40 mg daily. 15.Zolpidem 5 mg at night.  PHYSICAL EXAMINATION:  VITALS:  Temperature 97.4, blood pressure 142/62, pulse 59, respiration 18, sating 95% on room air. GENERAL:  The patient appears to be in no acute distress currently. HEAD:  Nontraumatic.  Somewhat dry mucous membranes. LUNGS:  Clear to auscultation bilaterally. HEART:  Regular rate and rhythm.  No murmurs appreciated. ABDOMEN:  Soft, nontender, nondistended. LOWER EXTREMITIES:  Without clubbing, cyanosis, or edema. NEUROLOGIC:  Strength 5/5 in all four extremities.  There is a nystagmus present to the right.  The patient has had recurrent episodes of nausea throughout the interview. SKIN:  Clean, dry, and intact.  LABORATORY DATA:  White blood cell count 9.6, hemoglobin 14.1.  Sodium 142,  potassium 4.7, creatinine 1.2, blood sugar 177.  EKG showing sinus rhythm, heart rate of 58.  There is a T-wave inversion in leads aVL, T- wave flattening in V1, V2, V5, and V6 in comparison to prior EKG on January 23, 2011, T-wave abnormalities noted in the past except for T- wave inversion and aVL.  CT scan of the head is unremarkable.  MRI has not been read officially but reviewed by Neurology.  We could not appreciate any severe abnormalities but still is showing a small CVA.  ASSESSMENT AND PLAN:  This is a 65 year old gentleman with: 1. Stroke. 2. Cerebrovascular accident per Neurology, likely basal.  We will     admit, start on Plavix, order 2-D echo, carotid Dopplers.     Neurology does want to have CT  of head and neck. 3. Hypertension.  Continue Avapro for now and Bystolic.  Hold Norvasc     so as not to drop depression. 4. Diabetes.  Put on sliding scale.  Hold metformin.  Write for Lantus     35 units.  If the patient does not pass swallow eval, then this may     need to be stopped. 5. History of chronic obstructive pulmonary disease.  Continue home     meds, Advair and Spiriva. 6. Hyperlipidemia.  Continue home meds. 7. Prophylaxis.  Protonix and SCDs. 8. Code status.  The patient is full code. 9. History of congestive heart failure.  The patient does appear to be     slightly fluid down.  Hold Lasix for now while he is actively     having nausea, vomiting, poor p.o. intake, and follow his fluid     status carefully.     Michiel Cowboy, MD     AVD/MEDQ  D:  01/28/2011  T:  01/28/2011  Job:  [id number]  cc:   Candyce Churn. Allyne Gee, M.D.  Electronically Signed by Therisa Doyne MD on 02/13/2011 10:08:06 PM

## 2011-02-14 NOTE — Discharge Summary (Signed)
NAME:  Sean Saunders               ACCOUNT NO.:  [id number]  MEDICAL RECORD NO.:  [id number]           PATIENT TYPE:  I  LOCATION:  3006                         FACILITY:  [facility]  PHYSICIAN:  Joycelyn Schmid, MD   DATE OF BIRTH:  10-Oct-1946  DATE OF ADMISSION:  02/08/2011 DATE OF DISCHARGE:  02/10/2011                              DISCHARGE SUMMARY   DIAGNOSES AT THE TIME OF DISCHARGE: 1. 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 2. Small incidental right falx meningioma. 3. Posterior circulation transient ischemic attack. 4. Hypertension. 5. Diabetes. 6. Hypothyroidism. 7. Dyslipidemia. 8. Chronic obstructive pulmonary disease.  MEDICINES AT TIME OF DISCHARGE: 1. Meclizine 25 mg q.i.d. p.r.n. dizziness. 2. Phenergan 25 mg q.8 h. p.r.n. nausea. 3. Zocor 20 mg a day. 4. Advair Diskus 1 puff b.i.d. 5. Avapro 300 mg a day. 6. Bystolic 1 tablet a day. 7. Plavix 1 tablet a day with meals. 8. Furosemide 40 mg a day. 9. Glucophage XR 1000 mg b.i.d. 10.Humulin R U-500 concentrated 35 units in the morning and 10 units     at dinner. 11.Januvia 100 mg a day. 12.Norvasc 5 mg a day. 13.Prilosec 20 mg b.i.d. 14.Spiriva 1 capsule q.a.m. 15.Synthroid 137 mcg a day. 16.Zetia 10 mg a day. 17.Zolpidem 5 mg nightly.  STUDIES PERFORMED:  CT of the brain on admission shows no acute abnormality.  MRI of the brain shows small intracanalicular left vestibular schwannoma approximately 5 x 5 x 4 mm, recommend ENT followup.  Questionable punctate acute or subacute lacunar infarct of the lateral thalamus on the right versus artifact.  Small right parasagittal meningioma.  EKG shows sinus bradycardia, otherwise normal.  LABORATORY STUDIES:  Cholesterol 111, triglycerides 136, HDL 34, LDL 50. Coagulation studies normal.  Hemoglobin A1c 10.0.  Cardiac enzymes negative.  Urinalysis normal.  Chemistry with glucose 193, BUN 24, creatinine  1.21.  HISTORY OF PRESENT ILLNESS:  [name] is a 65 year old right- handed male with a recent history of TIA, discharged from the hospital approximately 10 days ago.  He has a history of hypertension, diabetes, smoking and family history of stroke.  He had acute onset of vertigo at 6:00 p.m. the day prior to admission.  The patient started having intense vertigo without any provocation that was accompanied by nausea. The patient reports he was unstable with his gait and decided to rest. His symptoms have improved overnight, but then upon waking up his symptoms had returned.  The patient states the symptoms progressed and became very intense around 9:30 a.m. the morning of admission.  The patient reports bumping into walls and unable to balance himself sitting or standing.  The patient has not vomited but feels like everything is moving and this gives him intense feeling of vertigo.  The patient has no speech difficulty or swallowing difficulty.  The patient had similar symptoms 10 days ago, but at that time he also had right-sided weakness which is not present at this admission.  He was not a TPA candidate secondary to delay in arrival.  He will be admitted to the Neurology service  for further evaluation and possible stroke.  HOSPITAL COURSE:  MRI was negative for acute stroke.  The patient was found to have a vestibular schwannoma measuring 5 x 5 x 4.  Neurosurgery was consulted.  He requests formal evaluation as an outpatient at ENT, then he will follow up though he doubts candidate for resection.  The patient has appointment with Dr. Suszanne Conners on March 15, then we will follow up with Dr. Phoebe Perch.  In hospital, the patient was evaluated by PT, OT and speech therapy.  He was found to have no needs.  He has no residual stroke related deficits. It is not felt this was stroke or TIA at all and, as compared with previous event 10 days ago which looked to be TIA secondary  to hematemesis involvement.  The patient has been counseled to stop smoking.  The patient's Zocor also changed this admission due to pharmacy recommendation.  Per pharmacy, there is increased risk of myopathy and the FDA recommends maximum dose of 20 mg simvastatin when used along with Norvasc.  Therefore at the time of discharge, the patient's Zocor will be decreased from 40 to 20 and will follow up with his primary care physician for any other adjustments that will be needed.  CONDITION AT DISCHARGE:  The patient with pupils 3 reactive down to 2. His face is symmetric.  He has some dysmetria in left over extremity greater than right.  He has no extremity weakness.  He has normal speech.  He is alert and oriented x3.  He has chronic decreased hearing and tinnitus on the left times many years.  He has mild nystagmus on lateral gaze, mild left finger-to-nose dysmetria, no focal weakness.  DISCHARGE/PLAN: 1. Discharge home with wife. 2. Meclizine and Phenergan for symptomatic treatment.  Next     appointment with Dr. Suszanne Conners, ENT on March 15 at 1:50 p.m. 3. Follow up with Dr. Phoebe Perch after evaluation by Dr. Suszanne Conners. 4. Follow up with Dr. Pearlean Brownie in 1-2 months. 5. Followup appointments with other medical care providers as     arranged at time of last discharge.     Annie Main, N.P.   ______________________________ Joycelyn Schmid, MD    SB/MEDQ  D:  02/10/2011  T:  02/11/2011  Job:  [id number]  cc:   Candyce Churn. Allyne Gee, M.D. Pramod P. Pearlean Brownie, MD Di Kindle. Edilia Bo, M.D. Newman Pies, MD Sable Feil  Electronically Signed by Annie Main N.P. on 02/11/2011 11:29:11 AM Electronically Signed by Joycelyn Schmid  on 02/14/2011 12:40:02 PM

## 2011-02-18 NOTE — H&P (Signed)
NAME:  Sean Saunders, Sean Saunders               ACCOUNT NO.:  [id number]  MEDICAL RECORD NO.:  [id number]           PATIENT TYPE:  I  LOCATION:  3006                         FACILITY:  [facility]  PHYSICIAN:  Levert Feinstein, MD          DATE OF BIRTH:  13-Aug-1946  DATE OF ADMISSION:  02/08/2011 DATE OF DISCHARGE:                             HISTORY & PHYSICAL   CHIEF COMPLAINT:  Dizziness.  HISTORY OF PRESENT ILLNESS:  The patient is a 65 year old male with recent history of possible TIA and discharge from the hospital about 10 days ago, hypertension, diabetes, smoking history, hyperlipidemia, age, and strong family history of stroke presents with acute onset of vertigo since approximately 6 p.m. since yesterday.  The patient started having intense vertigo without any provocation that was accompanied by nausea. The patient reports that he was unstable with the gait and decided to rest.  His symptoms had improved overnight, but then upon waking up this morning had returned.  The patient states the symptoms progressed and became very intense around 9:30 this morning.  The patient reports bumping into the walls and unable to balance himself sitting or standing.  The patient has not vomited but reports that feels like everything is moving and this gives him intense feeling of vertigo.  The patient has no speech difficulty, swallowing difficulty.  The patient also denies fevers, headache, or vision changes at this time.  The patient had similar symptoms at the time of prior admission about 10 days ago, but at that time he also had a right-sided weakness which is not present at this admission.  PAST MEDICAL HISTORY: 1. Hypertension. 2. Diabetes. 3. High cholesterol. 4. TIA history 10 days prior, workup included MRI which showed about 1-     cm size frontal meningioma and atherosclerosis of posterior     circulation but otherwise no stenosis and no acute stroke at that     time. 5. Hypothyroidism. 6.  COPD. 7. Peripheral arterial disease.  MEDICATIONS: 1. Advair. 2. Glucophage. 3. Bystolic. 4. Humulin insulin. 5. Januvia. 6. Lasix. 7. Prilosec. 8. Spiriva. 9. Synthroid. 10.Zetia. 11.Zocor. 12.Zolpidem. 13.Plavix.  ALLERGIES: 1. The patient is allergic to NIACIN  which gives him itching. 2. LUNESTA, details are unknown.  FAMILY MEDICAL HISTORY:  History of TIA in mother.  Sister had a stroke along with diabetes and hypertension.  SOCIAL HISTORY:  The patient used to smoke for many years  about one pack per day.  He has not smoked in the last 10 days.  Alcohol and illicit drug use is denied by the patient.  REVIEW OF SYSTEMS:  As per HPI.  PHYSICAL EXAMINATION:  VITAL SIGNS:  Blood pressure is 150/118, pulse is 56, respiratory rate is 18, 98% oxygen saturation on room air. Temperature is 96.8. GENERAL:  The patient is laying in the bed, feels a little drowsy and pale. NECK:  Supple.  Full range of motion.  No carotid bruit. CNS:  Mental status, alert and oriented x3.  Carries out two-step commands.  Cranial nerve exam PERRLA.  Conjugate gaze.  EOMI.  Visual field is intact.  The patient has an nystagmus with a fast component on the left side.  Face is symmetrical.  Tongue is midline.  Uvula is midline.  The patient does not have any slurred speech or facial droop. Sensation is intact in V1-V3 distribution.  Shoulder shrug and head are normal.  The patient has a normal finger-to-nose coordination but shin- to-heel coordination is somewhat ataxic when using the left foot.  Gait, the patient has intense dizziness on getting up.  He has forward motion which is uncontrollable at times.  Motor examination, 5/5 strength in all extremities.  Deep tendon reflexes are normal.  There is no drift. Sensation exam, the patient has normal intact sensation and proprioception over his extremities well. PULMONARY:  The patient has mild wheezing and rhonchi.  Otherwise, clear to  auscultation bilaterally. CARDIOVASCULAR:  Normal S1 and S2.  Regular rate and rhythm.  No murmur. ABDOMEN:  Soft and nontender.  Truncal obesity.  The skin appears moist without any lesions. EXTREMITIES:  Changes from the peripheral vascular disease in both lower extremities with some pigmentation of the skin.  LABORATORY DATA:  Sodium 139, potassium 4.9, chloride 107, bicarb is 25, BUN is 24, creatinine is 1.21, glucose is 193.  LFTs are normal. Hemoglobin is 13.7, white count is 9.7, platelets 204,000.  UA is negative.  Imaging test done today include CT head without contrast.  He does not show any acute abnormality and confirmed the finding of meningioma from previous MRI.  ASSESSMENT AND PLAN:  The patient is a 65 year old male with recent history of possible transient ischemic attack who presents with intense dizziness.  The patient has multiple risk factors for cerebrovascular accident and it is possible that he has had a small stroke in the posterior circulation area.  We would admit him to the Neurology Service and perform further testing as following: 1. We will obtain an MRI for further investigation. 2. PT/OT for evaluation of gait and postural vertigo. 3. Continue Plavix as antiplatelet agent.  Continue controlling     other CVA risk factors including diabetes with insulin and     hyperlipidemia with statin. 4. We will perform fasting lipid panel to reassess     hypercholesterolemia. 5. Smoking cessation counseling as the patient was recently smoking. 6. Chronic obstructive pulmonary disease.  We will continue his home     regimen.  The patient does not appear to have any COPD exacerbation     at this point of time. 7. Hypertension.  We will continue to control his blood pressure     aggressively. 8. Hypothyroidism.  Check TSH and continue Synthroid at his home dose.  Thank you for consultation.     Clerance Lav, MD  PhD   ______________________________ Levert Feinstein, MD    RS/MEDQ  D:  02/08/2011  T:  02/09/2011  Job:  [id number]  Electronically Signed by Clerance Lav MD PHD on 02/11/2011 03:40:18 PM Electronically Signed by Levert Feinstein MD on 02/17/2011 08:26:02 AM

## 2011-03-02 ENCOUNTER — Encounter (INDEPENDENT_AMBULATORY_CARE_PROVIDER_SITE_OTHER): Payer: Medicare Other | Admitting: Vascular Surgery

## 2011-03-02 DIAGNOSIS — I6529 Occlusion and stenosis of unspecified carotid artery: Secondary | ICD-10-CM

## 2011-03-03 NOTE — Consult Note (Signed)
NEW PATIENT CONSULTATION  Sean Saunders, POSTIGLIONE C DOB:  1946-07-04                                       03/02/2011 EAVWU#:[id number]  I saw the patient in the office today in consultation concerning a moderate left carotid stenosis.  This is a pleasant 65 year old gentleman who I last saw back in December 2007.  He had undergone an aortobifemoral bypass graft for aortoiliac occlusive disease in August 2006.  He was then lost to follow-up.  He states that at the end of February and then again in early March he had several episodes in which he essentially blacked out and had generalized weakness.  He does not remember any focal weakness or paresthesias, except for after the initial event he noted some mild weakness in the right arm and right leg which resolved fairly quickly.  He has had no problems with expressive or receptive aphasia or any problems with generalized dizziness.  He has had no previous history of stroke, TIAs, expressive or receptive aphasia or amaurosis fugax.  He underwent an extensive workup including multiple head scans in February, and I have reviewed these studies.  On 01/28/2011 he had a CT scan of the head which showed no evidence of stroke.  On 01/29/2011 he had an MR of the brain which showed a 10 x 12- mm right frontal interhemispheric meningioma.  CT angio of the head on 01/29/2011 showed some calcific disease in the cavernous carotids bilaterally but no significant stenosis.  There was no proximal stenosis, aneurysm or branch vessel disease.  Follow-up CT scan on 02/08/2011 showed no change, and then most recently on 02/09/2011 he had an MR of the brain which showed a small intracanalicular left vestibular schwannoma measuring 5 x 5 x 4 mm.  He also had a questionable punctate lacunar infarct of the lateral right thalamus, although this was also felt to potentially be artifact.  He was noted to have a stable small right parasagittal  meningioma with no edema or mass effect.  His workup also included a carotid duplex scan which showed a 40% to 59% left carotid stenosis.  PAST MEDICAL HISTORY:  Fairly complicated.  He does have a history of diabetes which has been poorly controlled.  His most recent hemoglobin A1c was 9.7, and he has been started on a new insulin.  In addition, he has hypertension, hypercholesterolemia and COPD.  I have also reviewed the records from Dr. Allyne Gee' office which shows in addition he has a history of hypothyroidism.  Since I saw him last, he has had a right hip replacement.  SOCIAL HISTORY:  He is married.  He is retired.  He was a Midwife until 2 years ago.  He quit tobacco approximately 6 years ago.  FAMILY HISTORY:  There is no history of premature cardiovascular disease.  REVIEW OF SYSTEMS:  GENERAL:  He has had some weight gain since I saw him previously.  He is 6 feet, 250 pounds.  He has had no change in his appetite. CARDIOVASCULAR:  He has had no chest pain, chest pressure, palpitations or arrhythmias.  He has had no history of claudication or rest pain.  He has had a previous history of DVT. NEUROLOGIC:  He has had the blackout spells, as noted above, with no significant headaches or seizures. PULMONARY:  He has COPD and has had bronchitis in the past  and occasionally uses home O2. ENT:  He has had some change in his hearing and his eyesight recently. PSYCHIATRIC:  He does have a history of depression. GI, hematologic, GU, musculoskeletal, integumentary review of systems are unremarkable and is documented on the medical history form in his chart.  PHYSICAL EXAMINATION:  This is a pleasant 65 year old gentleman who appears his stated age.  Blood pressure is 130/85, heart rate is 65, saturation 98%.  HEENT:  Pupils are equal, round, reactive to light and accommodation.  Extraocular motions are intact.  Conjunctivae are normal.  I do not detect any significant problems  with his hearing. Lungs:  He has significant rhonchi bilaterally.  Cardiovascular:  I do not detect any carotid bruits.  Has a regular rate and rhythm.  He has palpable femoral pulses.  I cannot palpate pedal pulses, although both feet appear adequately perfused.  Abdomen:  Soft and nontender with normal pitched bowel sounds.  He has a small ventral hernia. Musculoskeletal:  No major deformities or cyanosis.  Neurologic:  I do not detect any focal weakness or paresthesias.  Skin:  There are no ulcers or rashes.  I have reviewed his carotid duplex scan which I believe was done at [facility].  This shows no evidence of carotid stenosis on the right.  On the left side he has a 40% to 59% stenosis.  Both vertebral arteries are patent with normally directed flow.  This patient presents with several episodes of blacking out.  He has the meningioma and the schwannoma as described on his MRI scan above. Certainly I would think that these could potentially be contributing, and he is scheduled to see a neurosurgeon at the end of March.  I cannot explain his blackout spells with respect to his carotid disease.  He has no evidence of significant vertebrobasilar insufficiency and only mild carotid disease on the left with no significant carotid disease on the right.  I have recommend a follow-up carotid duplex scan in 6 months, and I will see him back at that time.  I have reviewed the potential symptoms of cerebrovascular disease, and he knows that if he develops any significant sudden onset of weakness or paresthesias.  Once he has been evaluated by the neurosurgeons and no other source for his problems can be found, consideration could be given to obtaining a cerebral arteriogram; however, I have explained that this is associated with a 1% risk of stroke and at this point I think it is unlikely to show any disease which would affect my decision as to whether he would require carotid endarterectomy.   He has only a mild left carotid stenosis, and even if this were ulcerated, given that he has had no real focal symptoms, I would be reluctant to consider carotid endarterectomy.  I will see him back in 6 months.  He knows to call sooner if he has problems.    Di Kindle. Edilia Bo, M.D. Electronically Signed  CSD/MEDQ  D:  03/02/2011  T:  03/03/2011  Job:  [id number]  cc:   Candyce Churn. Allyne Gee, M.D. Danice Goltz, M.D. Clydene Fake, M.D.

## 2011-03-09 ENCOUNTER — Encounter: Payer: Medicare Other | Attending: Hematology and Oncology

## 2011-03-09 DIAGNOSIS — I10 Essential (primary) hypertension: Secondary | ICD-10-CM | POA: Insufficient documentation

## 2011-03-09 DIAGNOSIS — Z713 Dietary counseling and surveillance: Secondary | ICD-10-CM | POA: Insufficient documentation

## 2011-03-09 DIAGNOSIS — E119 Type 2 diabetes mellitus without complications: Secondary | ICD-10-CM | POA: Insufficient documentation

## 2011-03-24 ENCOUNTER — Ambulatory Visit: Payer: Medicare Other

## 2011-03-31 ENCOUNTER — Ambulatory Visit: Payer: Medicare Other

## 2011-04-26 NOTE — Consult Note (Signed)
NAME:  Sean Saunders, Sean Saunders               ACCOUNT NO.:  [id number]   MEDICAL RECORD NO.:  [id number]          PATIENT TYPE:  INP   LOCATION:  1531                         FACILITY:  [facility]   PHYSICIAN:  Charlcie Cradle. Delford Field, MD, FCCPDATE OF BIRTH:  08/13/1946   DATE OF CONSULTATION:  01/28/2008  DATE OF DISCHARGE:                                 CONSULTATION   CHIEF COMPLAINT:  Chronic obstructive airways disease and severe cough.   This is a 65 year old male admitted on January 24, 2008, to the  hospitalist service complaining of increased dyspnea and cough.  He was  in his usual state of health about a month ago when began having  increasing cough and dyspnea, still actively smoking. Was seen his  primary care Sean Saunders, given Zithromax without much improvement in the  cough, and 5 days prior to admission had another severe episode of cough  with thick green mucus, shortness of breath, chest tightness, and  soreness.  He was also short of breath after eating. He was admitted to  the intensive care unit at this point on January 24, 2008, treated with  steroids, empiric antibiotics. Sputum Gram stain and culture shows strep  pneumoniae. Chest x-ray has being clear.  He is admitted further  inpatient care.   PAST HISTORY:  1. COPD.  2. Type 2 diabetes.  3. Peripheral vascular disease.  4. Previous aortobifemoral.  5. Hypothyroidism.  6. Previous history of pneumonia.  7. Hyperlipidemia.  8. Hypertension.   ALLERGIES TO MEDICATIONS:  None.   MEDICATIONS PRIOR TO ADMISSION:  Lasix, lisinopril, levofloxacin,  rosiglitazone, Glucophage, Lantus, Zetia, Zocor, Cymbalta, Prevacid,  aspirin, NovoLog.   CURRENT MEDICATIONS:  Currently he is on Ventolin every 4 hours,  Norvasc, aspirin, Catapres, Cymbalta, Lovenox, Zetia, Flonase, Lasix,  Mucinex, Tussionex, Synthroid, metformin, Solu-Medrol, Protonix,  Avandia, and Januvia.   FAMILY HISTORY:  Positive for diabetes.   SOCIAL HISTORY:   Works at [employer].  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  He previously smoked but quit smoking a  month ago.   PHYSICAL EXAMINATION:  VITAL SIGNS:  Temperature 97, blood pressure  132/70, pulse 70, saturation 97% on 3 liters, respirations 18 .  GENERAL:  This is an obese male in no acute distress with incessant  coughing noted.  HEENT:  Normocephalic.  Face is flushed.  There is marked upper airway  pseudowheeze noted, and there are also distal expired wheezes noted on  chest exam with prolonged exhalation.  CARDIAC:  Regular rate and rhythm.  EXTREMITIES:  2+ pulses, trace edema.  ABDOMEN:  Soft.  Bowel sounds active.  NEUROLOGIC:  Intact.   Chest x-ray showed to be clear.  No active infiltrates.   Sputum culture showed strep pneumoniae.  Other lab data includes  hemoglobin 12.9, white count 17.7. Sodium 135, potassium 4.4, chloride  101, CO2 24, BUN 33, creatinine 1.09, blood sugar 178.  Liver functions  unremarkable. Previous blood gas showed pH 7.46, pO2 34, pO2 51.  This  was on January 24, 2008.   IMPRESSION:  Asthmatic bronchitis with  ongoing bronchospasm and flare  and associated mucous plugging also associated pseudowheeze with upper  airway component. Rule out chronic rhinitis even occult sinusitis as  cause.   RECOMMENDATIONS:  To this patient would be to intensify steroid therapy,  intensify mucolytics and cough suppressants with Tussionex.  Increase  the Flonase as well twice daily.  Stop Rocephin and Zithromax and  initiate Avelox daily orally.  Obtain CT scan of the sinuses. Will  follow this patient as well.  He will need also a sleep study evaluation  as an outpatient.  Will ensure this is followed up as well as his  pulmonary status as an outpatient with Dr. Vassie Loll.      Charlcie Cradle Delford Field, MD, [facility]  Electronically Signed     PEW/MEDQ  D:  01/28/2008  T:  01/29/2008  Job:  [id number]   cc:   Candyce Churn. Allyne Gee, M.D.  Fax: [phone]    Oretha Milch, MD  [address] [place] [address]

## 2011-04-26 NOTE — H&P (Signed)
NAME:  Sean Saunders, Sean Saunders NO.:  [id number]   MEDICAL RECORD NO.:  [id number]          PATIENT TYPE:  EMS   LOCATION:  ED                           FACILITY:  [facility]   PHYSICIAN:  Marcellus Scott, MD     DATE OF BIRTH:  1946-04-02   DATE OF ADMISSION:  01/24/2008  DATE OF DISCHARGE:                              HISTORY & PHYSICAL   PRIMARY MEDICAL DOCTOR:  Candyce Churn. Allyne Gee, M.D.   CHIEF COMPLAINT:  Worsening dyspnea, cough.   HISTORY OF PRESENT ILLNESS:  Sean Saunders is a pleasant 65 year old  Caucasian male patient with extensive past medical history including  COPD, type 2 diabetes, hypothyroidism, hypertension and hyperlipidemia.  The patient indicated that he was in his usual state of health until  approximately a month ago when he had cough and dyspnea for which he was  seen by his primary medical doctor and received a course of Z-Pak with  resolution of his symptoms.  He remained asymptomatic until about 5 days  ago when he started having worsening cough which progressively has  gotten worse to intermittent yellow-green sputum, worsening dyspnea with  decrease in his effort to dyspnea at rest at this time.  He has  tightness of the chest, wheezing.  He has no fever, chills or rigors.  The patient has used nebulizations for treatment at home with no  significant change.  He is not able to eat because of dyspnea.  He also  claims that because of persistent coughing and difficulty breathing, his  chest is sore, but otherwise no chest pain.   PAST MEDICAL HISTORY:  1. COPD.  2. Type 2 diabetes.  3. Peripheral vascular disease, status post aortofemoral bypass.  4. Hypothyroid.  5. Pneumonia.  6. Hyperlipidemia.  7. Hypertension.   PAST SURGICAL HISTORY:  Status post aortofemoral bypass.   ALLERGIES:  NO KNOWN DRUG ALLERGIES.   CURRENT MEDICATIONS:  1. Lasix 20 mg p.o. daily.  2. Lisinopril 40 mg p.o. daily.  3. Levothyroxine 0.137 mg p.o. daily.  4.  Rosiglitazone 8 mg p.o. daily.  5. Glucophage XR 3,000 mg p.o. twice daily.  6. Lantus 85 mg subcutaneously q.h.s.  7. Januvia 100 mg p.o. daily.  8. Zetia 10 mg p.o. daily.  9. Zocor 40 mg p.o. daily.  10.Cymbalta60 mg p.o. daily.  11.Prevacid 30 mg p.o. twice daily.  12.Multivitamins 1 p.o. daily.  13.Aspirin 81 mg p.o. daily.  14.NovoLog 5 units.   FAMILY HISTORY:  The patient's mother is a diabetic.   SOCIAL HISTORY:  The patient is married.  He lives with his spouse.  He  works at [employer], but is unable to work for the last few days.  Unable to ambulate either because of the dyspnea.  He smoked for years  half a pack per day, but says he quit about a month ago.  There is no  history of alcohol or drug abuse.   ADVANCED DIRECTIVES:  He is full code.   REVIEW OF SYSTEMS:  A 14-systems reviewed and apart from history of  presenting illness, is pertinent for sinus pressure  and headache.  No  earache or sore throat.  The patient has received flu shot this season.  The patient received pneumonia shot a year ago.   PHYSICAL EXAMINATION:  GENERAL:  Sean Saunders is a moderately obese male  patient in obvious moderate respiratory distress.  VITAL SIGNS:  Temperature 99.4 degrees Fahrenheit, blood pressure 166/92  which was 235/106 on arrival, pulse 102 per minute regular, respirations  26 per minute, saturating 97% on 3 liters.  HEENT:  Atraumatic, normocephalic.  Pupils are equally reactive to light  and accommodation.  Oral cavity:  Mildly dry mucosa with minimal-mild  oropharyngeal erythema, but no thrush or drainage.  NECK:  Supple.  No JVD or carotid bruit.  LYMPHATICS:  No lymphadenopathy.  RESPIRATORY:  There are decreased breath sounds bilaterally with harsh  breath sounds and prolonged expiration, bilateral wheezing and rhonchi.  No crackles.  CARDIOVASCULAR:  First and second heart sounds are heard.  Regular.  No  third or fourth heart sounds or murmurs.  ABDOMEN:   Nondistended.  Laparotomy scar midline.  No organomegaly or  mass appreciated.  Nontender.  Bowel sounds are normally heard.  NEUROLOGIC:  The patient is awake, alert and oriented x 3.  No focal  neurological deficits.  EXTREMITIES:  No clubbing, cyanosis, or edema.  Peripheral pulses are  symmetrically felt.   LABORATORY DATA:  BNP of less than 30.  Point of care cardiac markers  are negative.  Comprehensive metabolic panel unremarkable with BUN 16,  creatinine 1.12, glucose 119.  CBC:  Hemoglobin 14.4, hematocrit 42.5,  white blood cell 15.4, platelets 179.   DIAGNOSTICS:  1. X-ray of the chest which has been reported and has been reviewed by      me.  No acute cardiopulmonary disease.  Mild peribronchial      thickening likely related to chronic bronchitis or smoking.  2. EKG is sinus tachycardia at 103 beats per minute with normal axis.      Some T-wave inversions in lead I and aVL.   ASSESSMENT/PLAN:  1. Acute infective exacerbation of chronic obstructive pulmonary      disease.  We will admit patient to ICU.  We will check ABG.  We      will place patient on IV steroids, bronchodilator nebs, oxygen.  We      will also place on Mucinex and Robitussin to loosen secretions.  We      will consider a pulmonary consult if there is no prompt      improvement.  We will also place the patient on IV antibiotics of      ceftriaxone and azithromycin.  The patient has already received IV      Solu-Medrol and antibiotics in the ED.  2. Diabetes.  To continue Lantus, sliding-scale insulin, metformin at      a reduced dose, rosiglitazone.  3. Hypertension which is improved compared to when he came in.      Continue home medications.  4. Hypothyroidism.  We will continue home medications and check TSH.      Marcellus Scott, MD  Electronically Signed     AH/MEDQ  D:  01/24/2008  T:  01/24/2008  Job:  [id number]   cc:   Candyce Churn. Allyne Gee, M.D.  Fax: [phone]

## 2011-04-26 NOTE — Discharge Summary (Signed)
NAME:  Sean Saunders, Sean Saunders               ACCOUNT NO.:  [id number]   MEDICAL RECORD NO.:  [id number]          PATIENT TYPE:  INP   LOCATION:  1531                         FACILITY:  [facility]   PHYSICIAN:  Isidor Holts, M.D.  DATE OF BIRTH:  July 23, 1946   DATE OF ADMISSION:  01/24/2008  DATE OF DISCHARGE:  02/01/2008                               DISCHARGE SUMMARY   PRIMARY CARE PHYSICIAN:  Dr. Dorothyann Peng.   For discharge diagnoses refer to interim discharge summary dictated by  Dr. Marcellus Scott on January 27, 2006. In addition:  1. Vocal cord dysfunction.  2. Upper airway obstruction.  3. Possible obstructive sleep apnea syndrome.  4. Acute maxillary sinusitis.   DISCHARGE MEDICATIONS:  1. Lasix 20 mg p.o. daily.  2. Levothyroxine 137 mcg p.o. daily.  3. Rosiglitazone 8 mg p.o. daily.  4. Januvia 100 mg p.o. daily.  5. Glucophage XR 1000 mg p.o. b.i.d. (was on 2000 mg p.o. b.i.d.).  6. Zetia 10 mg p.o. daily.  7. Zocor 40 mg p.o. q.h.s.  8. Cymbalta 60 mg p.o. daily.  9. Prevacid 30 mg p.o. b.i.d.  10.Aspirin 81 mg p.o. daily.  11.Avelox 400 mg p.o. daily for 5 days only from February 02, 2008.  12.Lantus 13 units subcutaneously b.i.d. (changed from pre-admission      home regimen).  13.Flonase nasal spray 0.05% 2 sprays each nostril b.i.d.  14.Mucinex 600 mg p.o. b.i.d. for 1 week.  15.Clonidine 0.2 mg p.o. b.i.d.  16.Avapro 300 mg p.o. daily.  17.Prednisone 30 mg p.o. daily for 3 days, then 20 mg p.o. daily for 3      days, then 10 mg p.o. daily for 3 days then stop.  18.Novolog per sliding scale   Note:  Lisinopril has been discontinued, secondary to persistent cough.   PROCEDURES:  As in interim discharge summary of January 28, 2008. In  addition:  1. Two-view chest x-ray dated January 28, 2008.  This showed no      active cardiopulmonary disease.  2. CT scan paranasal sinuses dated January 28, 2008. This showed      inflammatory changes in the paranasal sinuses.  Air-fluid levels are      noted in both maxillary sinuses worrisome for acute sinusitis.  3. Chest x-ray dated January 31, 2008. This showed subsegmental      atelectasis in the bases, no pneumonia, moderate vascular      congestion without edema.  4. Pulmonary function testing done January 30, 2008. This was      interpreted as showing moderate obstructive lung defect. Airway      obstruction is confirmed by the decrease in flow rate at peak flow      and flow at 35%, 50% and 75% of the flow volume curve.  There is a      mild restrictive lung defect. There is a moderate decrease in      diffusion capacity. SVC change by 31%,  FEV-1 changed by 31%, FEF      25-75 changed by 40%.  This is interpreted as a good response to  bronchodilator.   CONSULTATIONS:  Dr. Shan Levans, pulmonologist.   For details of admission history, procedures, detailed clinical course,  refer to above-mentioned interim discharge summary.  However, for the  period from January 29, 2008 to January 31, 2008, the following are  pertinent:  The patient was evaluated initially by Dr. Shan Levans  pulmonologist. For details of his consultation, refer to his  consultation notes of January 28, 2008. The patient was subsequently  seen by Dr. Sherene Sires, pulmonologist.  Recommendation was to obtain CT scan  of paranasal sinuses. This was carried out, and demonstrated acute  maxillary sinusitis, for which the patient was treated with Flonase  nasal spray.  In addition, pulmonologist opined that the patient may  have vocal cord dysfunction, as well as upper airway obstruction. He  underwent pulmonary function testing on January 30, 2008. For details,  refer to procedure list above.  The patient has been educated on  appropriate exercises to help with vocal cord dysfunction.  However, he  has been recommended referral to ENT for laryngoscopy. The patient's  subsequent clinical course was one of steady improvement.   We did have  some problems with management of his glycemia and this necessitated  modification of his insulin regimen.  So that he is now on 30 units  subcutaneously of Lantus q.12 hourly. The night time dose was markedly  reduced, because of early a.m. hypoglycemia. The patient has been  counseled to discontinue smoking, and he appears to keen to quit.  Blood  pressure was controlled with a combination of Avapro and Clonidine.  ACE  inhibitor has been discontinued, as this was deemed to be the  perpetuating the patient's cough.   DISPOSITION:  The patient was considered sufficiently clinically  recovered and stable to be discharged on February 01, 2008. Certainly he  felt considerably better, and keen to be discharged.  He was therefore  discharged accordingly.  He is recommended not to return to work, until  reevaluated by his primary MD, Dr. Dorothyann Peng, in the coming week.   DIET:  Heart-healthy/carbohydrate modified.   ACTIVITY:  Recommended to increase activity slowly.   FOLLOW-UP INSTRUCTIONS:  The patient is recommended to follow up with  Dr. Vassie Loll or Dr. Sherene Sires, pulmonologist, telephone number [phone] one week  after discharge. He is to follow up with Dr. Dorothyann Peng, his primary  MD, in the coming week.  He has been instructed to call for an  appointment.   SPECIAL INSTRUCTIONS:  The patient will need ENT referral and outpatient  sleep study.  We expect the patient's PMD to arrange this, although it  is also possible that the pulmonologist will arrange these referrals.      Isidor Holts, M.D.  Electronically Signed     CO/MEDQ  D:  02/01/2008  T:  02/01/2008  Job:  [id number]   cc:   Candyce Churn. Allyne Gee, M.D.  Fax: [phone]   Charlcie Cradle Delford Field, MD, FCCP  [address]  [place]  [place] [address]   Oretha Milch, MD  [address] [place] [address]   Charlaine Dalton. Sherene Sires, MD, FCCP  [address]  [place] [address]

## 2011-04-26 NOTE — Discharge Summary (Signed)
NAME:  Sean Saunders, Sean Saunders               ACCOUNT NO.:  [id number]   MEDICAL RECORD NO.:  [id number]          PATIENT TYPE:  INP   LOCATION:  1531                         FACILITY:  [facility]   PHYSICIAN:  Marcellus Scott, MD     DATE OF BIRTH:  [birth year]   DATE OF ADMISSION:  01/24/2008  DATE OF DISCHARGE:                               DISCHARGE SUMMARY   DATE OF DISCHARGE:  To be determined.   PRIMARY MEDICAL DOCTOR:  Dorothyann Peng.   DISCHARGE DIAGNOSES:  1. Acute exacerbation of chronic obstructive pulmonary disease.  2. Acute streptococcal pneumonia bronchitis.  3. Persistent cough.  4. Uncontrolled type 2 diabetes.  5. Hypertension.  6. Hyperlipidemia.  7. Leukocytosis.  8. Hypothyroidism.  9. Hypoxic respiratory failure.  10.History of tobacco abuse.   DISCHARGE MEDICATIONS:  To be dictated at discharge.   PROCEDURES:  1. Chest x-ray, January 28, 2008, with no acute cardiopulmonary      disease.  2. Chest x-ray on the January 25, 2008, no acute cardiopulmonary      abnormality identified.   PERTINENT LABS:  Sputum culture on the January 24, 2008, with  streptococcus pneumonia which is sensitive to ceftriaxone, levofloxacin,  and resistant to penicillin.  Hemoglobin A1c of 9, TSH of 1.069.  Lipid  panel is normal.  BNP was less than 30.   CONSULTATIONS:  Requested from [facility] and pending.   HOSPITAL COURSE AND PATIENT DISPOSITION:  Please refer to the H&P by  this dictator for initial admission details.  In summary, [name] is a  pleasant 65 year old Caucasian male with history of COPD, type 2  diabetes, hypothyroidism, hypertension, hyperlipidemia who presented  with worsening productive cough and dyspnea which was not relieved with  home nebulizers.  Evaluation in the emergency revealed patient in  moderate to severe respiratory distress with low-grade temperature,  markedly elevated blood pressures, and hypoxia.  He was thereby admitted  with a diagnosis  of acute infective exacerbation of COPD.  The patient  was admitted to the ICU.  1. Acute exacerbation of COPD.  Patient was admitted to the ICU      initially.  Sputum cultures were sent off.  Patient was placed on      empiric antibiotics of ceftriaxone and Zithromax.  He was placed on      iv steroids, bronchodilator nebs, Mucinex, and oxygen.  With these      measures, patient has made a slow improvement, however, persistent      cough seems to be one of his major symptoms.  For the first couple      of days, his dyspnea improved but then has plateaued.  Now, for the      last day or two, patient indicates that his persistent cough and      dyspnea are no different.  We have changed his ACE inhibitors to      ARB, increased his PPIs to twice daily, and added Nasonex for      question allergic rhinitis.  With these measures, there still is no      significant  improvement in his symptoms.  Patient, however, looks      comfortable, is able to ambulate and shower, but still has      decreased breath sounds both bilaterally with scattered rhonchi and      wheezing.  I have requested [facility] for their assistance.  2. Acute strep pneumonia bronchitis.  Patient is on ceftriaxone.  His      Zithromax was discontinued.  3. Persistent cough/management per #1.  4. Uncontrolled type 2 diabetes.  The patient continues to have      uncontrolled diabetes most likely complicated by the steroids in a      person who already required high dose of insulin.  We will add meal      time insulin and with reducing the dose of steroids his diabetes      control should improve.  5. Hypertension.  Patient's ACE inhibitors were changed to ARB with      fair control.  6. Hyperlipidemia.  7. Hypothyroidism.      Marcellus Scott, MD  Electronically Signed     AH/MEDQ  D:  01/28/2008  T:  01/29/2008  Job:  [id number]   cc:   Candyce Churn. Allyne Gee, M.D.  Fax: [phone]

## 2011-04-29 NOTE — Op Note (Signed)
NAME:  Sean Saunders, Sean Saunders               ACCOUNT NO.:  [id number]   MEDICAL RECORD NO.:  [id number]          PATIENT TYPE:  INP   LOCATION:  2860                         FACILITY:  [facility]   PHYSICIAN:  Di Kindle. Edilia Bo, M.D.DATE OF BIRTH:  [birth year]   DATE OF PROCEDURE:  08/11/2005  DATE OF DISCHARGE:                                 OPERATIVE REPORT   PREOPERATIVE DIAGNOSIS:  Progressive right lower extremity ischemia with  aortoiliac occlusive disease.   POSTOPERATIVE DIAGNOSIS:  Progressive right lower extremity ischemia with  aortoiliac occlusive disease.   PROCEDURE:  Aortobifemoral bypass graft with 14 x 7 mm Hemashield graft.   SURGEON:  Waverly Ferrari, M.D.   ASSISTANT:  Quita Skye. Hart Rochester, M.D., Jerold Coombe, P.A.   ANESTHESIA:  General.   INDICATIONS:  This is a 65 year old gentleman who had been following with  aortoiliac occlusive disease. He had had a long history of claudication in  the right lower extremity and had a known right common iliac artery  occlusion. He also had some moderate disease in the left common iliac  artery. His symptoms progressed significantly and were significantly  interfering with his lifestyle. In addition, he had developed rest pain in  the right foot. Given the progression of his ischemia, he wished to proceed  with vascularization. He underwent an arteriogram which showed he had a  common iliac artery occlusion on the right. On the left side, he had a  moderate common iliac artery stenosis which had a significant resting  gradient. Given his age, it was felt that the best option for long-term  success would be an aortofemoral bypass graft. The procedure and potential  complications were discussed with the patient preoperatively. All his  questions were answered and he was agreeable to proceed.   TECHNIQUE:  The patient was taken to the operating room after an arterial  line and Swan-Ganz catheter were placed by anesthesia. The  patient received  a general anesthetic. The abdomen, groins, and thighs were prepped and  draped in the usual sterile fashion. On the left side, a longitudinal  incision was made over the common femoral artery and the dissection carried  down to the artery which was controlled with a blue vessel loop. I went up  fairly high to the inguinal ligament and created a tunnel into the  retroperitoneum on the left. On the right side, a longitudinal incision was  made over the right common femoral artery and the common femoral artery,  superficial femoral, and deep femoral arteries were all dissected free and  controlled with vessel loops. Of note, he was fairly large and this was a  fairly deep dissection. The arteries were noted to be somewhat small. The  tunnel was created on the right side into the retroperitoneum. The abdomen  was then entered through a midline incision. Upon our exploration, there was  some mild diverticular disease. No other intra-abdominal pathology was  noted. The transverse colon was reflected superiorly. The small bowel was  reflected to the right. The patient had a large amount of retroperitoneal  fat which was divided and  the aorta exposed up to the level of the renal  vein. The aorta was dissected free circumferentially below the level of the  renals and then retroperitoneal tunnels were created to both groins and  umbilical tapes were passed. Next, the patient was heparinized and received  25 grams of Mannitol. The infrarenal aorta was clamped and then a DeBakey  clamp placed across the distal aorta above the level of the IMA. A segment  of the aorta was excised and the distal aorta oversewn with two felt cuffs  and a 3-0 Prolene suture. Proximally, there was a posterior plaque which I  removed. A 14 x 7 graft was cut to the appropriate length and using a felt  cuff, sewn end-to-end to the infrarenal aorta using continuous 3-0 Prolene  suture. The proximal anastomosis  was tested and was hemostatic, and the  graft was flushed. Next, each of the limbs were pulled down for anastomosis  in the groins. Using the team approach on the left side, the graft was sewn  end-to-side to the common femoral artery. The artery had been clipped  proximally and distally and a longitudinal arteriotomy was made. The left  limb of the graft was cut to the appropriate length, spatulated, and sewn  end-to-side to the common femoral artery using continuous 5-0 Prolene  suture. Graft was then pulled to the appropriate length on the right side.  The common femoral, superficial femoral, and deep femoral arteries were all  controlled and a longitudinal arteriotomy made in the common femoral artery.  The right limb of the graft was cut to the appropriate length, spatulated,  and sewn end-to-side to the right common femoral artery using continuous 6-0  Prolene suture. At the completion, there were good femoral pulses.  Hemostasis was obtained in the wounds. In the abdomen, heparin was reversed  with protamine. The retroperitoneal tissue was closed over the graft with a  running 2-0 Vicryl. The abdominal contents were returned to their normal  position. The fascial layer was closed with two #1 PDS sutures. The  subcutaneous layer was closed with two running 3-0 Vicryl's. The skin in the  abdomen was closed with staples. The groin incisions were closed with a deep  layer of 2-0 Vicryl, a subcutaneous layer of 2-0 Vicryl, and the skin closed  with 4-0 subcuticular stitch. At the completion, there were posterior tibial  pulses bilaterally. The patient tolerated procedure well and was transferred  to recovery room in satisfactory condition. All needle and sponge counts  were correct.      Di Kindle. Edilia Bo, M.D.  Electronically Signed     CSD/MEDQ  D:  08/11/2005  T:  08/11/2005  Job:  [id number]

## 2011-04-29 NOTE — H&P (Signed)
NAME:  Sean Saunders, Sean Saunders NO.:  [id number]   MEDICAL RECORD NO.:  [id number]          PATIENT TYPE:  EMS   LOCATION:  MAJO                         FACILITY:  [facility]   PHYSICIAN:  Hettie Holstein, D.O.    DATE OF BIRTH:  1945/12/13   DATE OF ADMISSION:  12/25/2006  DATE OF DISCHARGE:                              HISTORY & PHYSICAL   PRIMARY CARE PHYSICIAN:  Dr. Andi Devon.   CHIEF COMPLAINT:  Difficulty breathing.   HISTORY OF PRESENT ILLNESS:  [name] is a pleasant 65 year old male  with a longstanding abuse and dependence and known diabetes and  hypertension, who has for the past several days had a productive cough  with yellow-colored sputum and breathing difficulty.  He continued to  attempt to go to work today and came home early in the day, which his  wife states is quite atypical.  His wife stated that she noted him to be  hyperventilating at home and having a persistent cough.  She brought him  to Dr. Dorothyann Peng' office, who subsequently directed him to the  emergency department where he was discovered to have an elevated  temperature of 101.7.  He underwent blood cultures and is being admitted  for acute exacerbation of chronic bronchitis.  He had a negative chest  radiographic.  His BNP was 93 and there were no other focal areas to  explain the etiology of his fever.  He is being admitted for further  workup and treatment of his exacerbation of bronchitis.   PAST MEDICAL HISTORY:  Noted for:  1. Previous hospitalization bronchitis, as well as pneumonia in the      past.  2. History of insulin requiring diabetes.  3. Peripheral vascular disease, status post aortofemoral bypass      grafting by Dr. Edilia Bo in August of 2006.  4. History of hypothyroidism.   PAST SURGICAL HISTORY:  As described above.  No known history of  coronary disease, according to the patient.  There is no echocardiogram  for review on E-Chart system.   MEDICATIONS:  He  takes:  1. Lasix 20 mg daily.  2. Lisinopril 40 mg daily.  3. Coreg 25 mg twice a day.  4. Levothyroxine 0.137 mg daily.  5. Rosiglitazone 8 mg daily.  6. Glucophage XR 2 grams twice a day.  7. Lantus 94 units q.h.s.  8. Januvia 100 mg once a day, this is a new medication for him.  9. Zetia 10 mg daily.  10.Zocor 40 mg daily.  11.Cymbalta 60 mg daily.  12.Prevacid 30 mg daily.  13.Multivitamin daily.   ALLERGIES:  NO KNOWN DRUG ALLERGIES.   SOCIAL HISTORY:  Patient is married and continues to work at the [employer].  He continues to smoke about a pack per day and  he drinks only occasionally.   FAMILY HISTORY:  His mother and father are both alive at age 41,  suffering only from diabetes and Alzheimers disease.   REVIEW OF SYSTEMS:  He had been in his usual general state of health up  until this  past week where he described some productive and persistent  cough and wheezing.  No chest pain.  He has had no nausea, vomiting or  diarrhea.  No swelling of his lower extremities.  No hematemesis,  hematochezia.  Further review has been unremarkable.  His weight has  been stable.   PHYSICAL EXAMINATION:  VITAL SIGNS:  His blood pressure was 162/79.  Heart rate 83.  O2 saturation was 95% on room air.  Temperature 101.7.  HEENT:  Revealed his head to be normocephalic, atraumatic.  His face  appeared flush.  NECK:  Supple.  Nontender.  No palpable thyromegaly or mass.  CARDIOVASCULAR EXAM:  Revealed normal S1, S2.  LUNGS:  Revealed tight wheezing with increased expiratory phase with  lots of central airway rhonchi.  He did exhibit some utilization of his  accessory muscles with respiration.  ABDOMEN:  Soft, obese and nontender.  EXTREMITIES:  Revealed no edema.  NEUROLOGIC EXAM:  Revealed him to be euthymic.  His affect was stable.  There were no focal neurologic deficits.   LABORATORY DATA:  His white count was 16.7, hemoglobin 14.9, platelet  count 168, MCV of  88.  I-STAT performed in the emergency department, his  sodium was 132, potassium 4.6, BUN 14, creatinine was 1.4.  His  hemoglobin was 17.0.  BNP was 93.  Chest x-ray revealed no acute  abnormalities.  EKG, in comparison to September EKG, revealed no change  with a nonspecific T-wave abnormality with a normal sinus rhythm.   ASSESSMENT:  1. Acute exacerbation of chronic bronchitis.  2. Diabetes.  3. Febrile illness.  4. Peripheral vascular disease.  5. Known history of hyperlipidemia.  6. History of hypothyroidism.  7. Elevated creatinine.  8. Tobacco dependence.  9. Hypertension.   Our plan at this time, we are going to admit Mr. Blau to medical  surgical floor.  We will follow him closely with continuous pulse  oximetry.  We will administer IV Solu-Medrol to assist with some of the  acute inflammation and  wean quickly.  We will initiate Avalox 400 mg IV q.24 and initiate  mucolytic in nebulizer treatments and continue his home medications.  We  will check his sputum gram-stain culture and cytology, as well as place  him on deep venous thrombosis prophylaxis with a low-molecular weight  heparin.  We will follow his clinical course.      Hettie Holstein, D.O.  Electronically Signed     ESS/MEDQ  D:  12/25/2006  T:  12/26/2006  Job:  [id number]   cc:   Candyce Churn. Allyne Gee, M.D.

## 2011-04-29 NOTE — H&P (Signed)
[place]  Patient:    Sean Saunders, Sean Saunders                        MRN: [id number] Adm. Date:  01/18/01 Attending:  Di Kindle. Edilia Bo, M.D. Dictator:   Marlowe Kays, P.A. CC:         Oley Balm. Georgina Pillion, M.D.  Lorenda Hatchet, M.D.   History and Physical  DATE OF BIRTH:  [birth year]  CHIEF COMPLAINT:  Aortoiliac obstructive disease.  HISTORY OF PRESENT ILLNESS:  A 65 year old white male referred by Dr. Georgina Pillion originally, as he was seen first in June 2001, secondary to his peripheral vascular disease - claudication.  An arteriogram showed a right CIA occlusion with moderate left CIA stenosis.  He was tried on an exercise program and Pletal, but he had a reaction to Pletal.  Despite the efforts, his symptoms have progressed.  At this time, he cannot ambulate a short distance without complaining of claudication.  Dr. Edilia Bo recommended to proceed with surgery. He will undergo aortobifemoral bypass graft scheduled for January 18, 2001. Of note, his left Dopplers in May 2001 showed a left ABI of 0.94 and a right ABI of 0.68.  He has bilateral buttocks pain, hip and thigh pain, as well as calf pain.  No foot pain.  He has bilateral rest pain as well as night pain. He denies any slow healing ulcers or gangrenous changes.  No ischemic changes. No peripheral edema.  He has decreased temperature in his feet.  No shortness of breath or dyspnea on exertion after walking.  No chest pain or palpitations.  PAST MEDICAL HISTORY: 1. AIOD claudication. 2. Hypertension. 3. Recent history of tobacco abuse. 4. Status post right index amputation secondary to injury - gangrene. 5. Decreased hearing bilaterally, secondary to loud noises at work. 6. Hemorrhoids.  PAST SURGICAL HISTORY: 1. Status post peripheral aortogram - bilateral iliac arteriogram with lower    extremity runoff, Dr. Edilia Bo August 08, 2000. 2. Status post T&A 1958.  MEDICATIONS: 1.  "Hydrodurl" (it is possible that it is HCTZ) 25 mg p.o. q.d. 2. Atenolol 100 mg p.o. q.d.  ALLERGIES:  NKDA.  REVIEW OF SYSTEMS:  See HPI and past medical history for significant positives, otherwise, no history of diabetes, kidney disease, or asthma. There is a questionable history of hypothyroidism.  FAMILY HISTORY:  Mother alive with a history of diabetes and CVA at age 38. The rest of the family history is noncontributory.  SOCIAL HISTORY:  The patient is married.  He has one grown child.  He is a Clinical cytogeneticist, Midwife.  He quit recently 1 pack a day of cigarettes which he smoked for 10 years.  He denies any alcohol intake.  PHYSICAL EXAMINATION:  GENERAL:  Well-developed, well-nourished, 65 year old white male in no acute distress.  Alert and oriented x 3.  VITAL SIGNS:  Blood pressure 130/70, pulse 70, respirations 16.  HEENT:  Normocephalic, atraumatic.  PERRLA.  EOMI.  No cataracts, glaucoma, or macular degeneration.  NECK:  Supple.  No JVD, bruits, or lymphadenopathy.  CHEST:  Symmetrical on inspiration.  Bilateral wheezing, more pronounced on the left than on the right.  No rhonchi, rales, or lymphadenopathy.  CARDIOVASCULAR:  Regular rate and rhythm without murmurs, rubs, or gallops. There is the presence of ectopic beats.  ABDOMEN:  Soft, nontender.  Bowel sounds x 4.  No masses or bruits.  GU/RECTAL:  Deferred.  EXTREMITIES:  No  clubbing or cyanosis.  No edema or ulcerations.  He has bilateral pronounced varicose veins.  The temperature in his extremities is decreased in his feet.  PULSES:  Peripheral pulses, carotid 2+ bilaterally.  Femoral 1+ on the left, absent on the right.  Popliteal, dorsalis pedis and posterior tibialis 1+ on the left, absent on the right.  NEUROLOGICAL:  Nonfocal, steady gait.  DTRs 2+ bilaterally.  Muscle strength 5/5.  ASSESSMENT AND PLAN:  Aortoiliac obstructive disease for aortobifemoral bypass graft on January 18, 2001, at [facility].  Dr. Edilia Bo has seen and evaluated this patient prior to the admission and has explained the risks, benefits involved in the procedure, and the patient has agreed to continue. DD:  01/15/01 TD:  01/15/01 Job: [id number]

## 2011-04-29 NOTE — Discharge Summary (Signed)
NAME:  Sean Saunders, Sean Saunders                           ACCOUNT NO.:  [id number]   MEDICAL RECORD NO.:  [id number]                   PATIENT TYPE:  INP   LOCATION:  0356                                 FACILITY:  [facility]   PHYSICIAN:  Deirdre Peer. Polite, M.D.              DATE OF BIRTH:  07-06-1946   DATE OF ADMISSION:  12/13/2002  DATE OF DISCHARGE:                                 DISCHARGE SUMMARY   PRIMARY CARE PHYSICIAN:  Dr. Dorothe Pea of [facility]  [place].   DISCHARGE DIAGNOSES:  1. Hyperglycemia secondary to noncompliance plus adverse effect secondary to     quinolone.  Admission blood sugar of 900.  2. Dehydration secondary to #1.  3. Lethargy secondary to #1.  4. Diabetes uncontrolled probably secondary to noncompliance.  Hemoglobin     A1C from old records 7.7 in June of 2003.  Hemoglobin A1C this admission     9.5.  5. Peripheral vascular disease stable.  6. Depression, recommend treating with Paxil.  7. Resolved bronchitis.  8. Hypertension.   DISCHARGE MEDICATIONS:  1. Glucovance 5/500 mg three in the morning, two in the p.m.  2. Avandia 8 mg daily.  3. Hydrochlorothiazide 25 mg daily.  4. Lisinopril 40 mg daily.  5. Levothyroxine 0.1 mg daily.  6. Aspirin 325 mg daily.  7. Paxil CR 25 mg one daily.   CONSULTATIONS:  None.   LABORATORY AND ACCESSORY DATA:  BMET on admission significant for  hyponatremia, had a sodium of 127 and a glucose of 978.  There was no  acidosis.  The patient's creatinine was 1.8.  Calcium was 11.  Follow up  labs:  BMET within normal limits except for hyperglycemia at 230.  AST/ALT  within normal limits.  Hemoglobin A1C 9.5.  UA:  No ketones, significant for  greater than 1000 glucose, specific gravity 1.031 suggesting marked  dehydration.  TSH 3.79.   Chest X-ray:  No infiltrate.  EKG:  No acute changes.   HISTORY OF PRESENT ILLNESS:  A 65 year old white male with history of above  medical problems who was sent to the ED  for evaluation and the initial  evaluation by the primary M.D. found him to be very lethargic and  hyperglycemic.  Of note, the patient was treated for approximately one week  with an antibiotic for bronchitis.  Since then the patient has been  excessively thirsty with polyuria and polydipsia.  Review was negative for  chest pain, shortness of breath.  There was an occasional nausea and  vomiting but none for three days.  No fever or chills.  No abdominal pain.  No change in his stool or urine.  In retrospect, the patient states that his  blood sugars have been running at least 150 or higher for 3-4 weeks.  However, over the last couple of days they have run as high as 250-300.  The  patient  has not been compliant with his diet, has not been exercising, and  according to his daughter may have been drinking more than what he normally  does.  Admission was deemed necessary for further evaluation and treatment.   PAST MEDICAL HISTORY:  As stated above in the problem list.   MEDICATIONS:  As stated above.   SOCIAL HISTORY:  Is positive for tobacco 1/2 pack a day greater than 30  years.  Social alcohol.  No drugs.   PAST SURGICAL HISTORY:  Significant for tonsillectomy and appendectomy.   ALLERGIES:  There are no known drug allergies.  Probably would list  QUINOLONE  as an adverse effect causes hyperglycemia.   PHYSICAL EXAMINATION ON ADMISSION:  GENERAL:  The patient is an ill-  appearing male, slightly lethargic.  HEENT:  Dry oral mucosa.  Otherwise within normal limits.  CHEST:  Moderate air movement.  Acute expiratory wheeze.  CARDIOVASCULAR:  Regular S1 & S2.  No murmur, rub, or gallop.  ABDOMEN:  Soft, nontender.  EXTREMITIES:  No edema.  White raised flecks on knees suggesting psoriasis.  Decreased pulse in dorsalis pedis.   HOSPITAL COURSE:  Problem 1.  HYPERGLYCEMIA:  The patient was admitted to a  medicine floor bed for evaluation and treatment of hyperglycemia, possible   HHNK.  He was started with aggressive fluid hydration and given insulin.  Quinolone was stopped as the patient has been treated for greater than  almost a week with outpatient antibiotics.  They were not responding for his  bronchitis.  The patient had rapid improvement in his mentation with IV  hydration as well as improvement in his creatinine back to baseline  function.  The patient was counseled on the need to have lifestyle  modification, i.e., diet and exercise compliance as well as compliance with  medications and to limit alcohol consumption.  As the patient's hemoglobin  has risen from 7.7 to 9.7, I suggested significant component of  noncompliance as the cause of his hyperglycemia as well as possible  quinolone effect.  The patient will be asked to check his blood sugars twice  a day and follow up with his primary M.D. with a blood sugar log and further  medication modification as deemed necessary on an outpatient basis, i.e.,  Lantus h.s.   Problem 2.  RESOLVING BRONCHITIS:  Resolved at the time of discharge.   Problem 3.  REACTIVE DEPRESSION:  The patient is suffering from depression  significantly since the death of his wife.  He was started on Paxil during  this hospitalization and it is recommended that he continues and should  probably be advised to seek outpatient counseling to help with coping skills  as it appears according to the daughter's conversation he is turning to  alcohol as a coping mechanism.                                               Deirdre Peer. Polite, M.D.    RDP/MEDQ  D:  12/15/2002  T:  12/15/2002  Job:  [id number]   cc:   Dr. Dorothe Pea of [facility] [place]

## 2011-04-29 NOTE — H&P (Signed)
NAME:  Sean Saunders, Sean Saunders               ACCOUNT NO.:  [id number]   MEDICAL RECORD NO.:  [id number]          PATIENT TYPE:  EMS   LOCATION:  ED                           [facility]ACILITY[facility]  [facility]   PHYSICIAN:  Deirdre Peer. Polite, M.D. DATE OF BIRTH:  06-May-1946   DATE OF ADMISSION:  03/15/2005  DATE OF DISCHARGE:                                HISTORY & PHYSICAL   CHIEF COMPLAINT:  Shortness of breath, cough.   HISTORY OF PRESENT ILLNESS:  Mr. Lyford is a 65 year old male with known  history of diabetes and hypertension, peripheral vascular disease,  depression, hypertension and tobacco abuse, who presents to the ED with  complaints of cough and shortness of breath.  The patient states that his  symptoms have been on and off for approximately 2 days; however, he states  that he did have an episode of bronchitis about a month ago that cleared up.  However, 2 days ago, he complained of paroxysms of cough that have been  nonproductive, shortness of breath and chest tightness.  The patient was  seen by his primary MD, found to have temperature of 102.8, pulse 120,  respiratory rate of 24 and there was concern for pneumonia, therefore the  patient was sent to the ED via EMS for evaluation.  In the ED, the patient  was evaluated, found to be febrile; however, x-rays were without infiltrate,  CBC significant for leukocytosis.  Because of the persistent nature of the  patient's symptoms and decreased SATS when removing nasal cannula, [facility]  hospitalist has been called for further evaluation and treatment.  In the  ED, the patient has been treated with antibiotics, O2 nebulizers and IV Solu-  Medrol.  Admission is deemed necessary for further evaluation and treatment  for possible pneumonia versus COPD exacerbation.   PAST MEDICAL HISTORY:  Past medical history significant for:  1.  Diabetes.  2.  Peripheral vascular disease.  3.  Hypertension.  4.  Hypothyroidism.  5.  Depression for which the patient  is  now taking medications.   MEDICATIONS:  Medications per ER sheet are significant for  hydrochlorothiazide, lisinopril, Norvasc, Synthroid, Glucophage, Lantus,  glipizide, Zetia and  Zocor; the patient is not sure of the dosages of his  medications.   SOCIAL HISTORY:  Social history positive for 1 pack per day of alcohol, no  drugs.   PAST SURGICAL HISTORY:  None.   ALLERGIES:  None.   FAMILY HISTORY:  Mother with history of MI, CVA and diabetes; father --  Alzheimer's.   REVIEW OF SYSTEMS:  Review of systems as stated in the HPI.   PHYSICAL EXAM:  VITAL SIGNS:  Temperature 101.1, BP 156/66, pulse 113,  respiratory rate 24.  HEENT:  Pupils equal, round, reactive to light.  Anicteric sclerae.  Moist  oral mucosa.  NECK:  No nodes.  No JVD.  CHEST:  Moderate air movement bilaterally with a few faint expiratory  wheezes, decreased breath sounds in the base.  CARDIOVASCULAR:  Regular.  ABDOMEN:  Obese and nontender.  EXTREMITIES:  No edema.  NEUROLOGIC:  Nonfocal.   DATA:  Chest x-ray:  No apparent disease.   CBC:  White count 12.4, hemoglobin 13.4, hematocrit 40.1, MCV 86.4,  neutrophil count of 85%.  Sodium 127, potassium 4.2, chloride 96, carbon  dioxide 21, BUN 21, creatinine 1.7, AST and ALT 32 and 26.   ASSESSMENT:  1.  Shortness of breath.  2.  Hypoxia.  3.  Nonproductive cough.  4.  Fever.  5.  Tobacco abuse.  6.  Diabetes.  7.  Peripheral vascular disease.  8.  Hypertension.  9.  Depression.  10. Hypothyroidism.  11. Azotemia.   RECOMMENDATION:  I recommend that patient be admitted to a telemetry floor  bed for evaluation and treatment of his shortness of breath, hypoxia and  cough.  Differential includes viral syndrome versus pneumonia versus COPD  exacerbation.  The patient will be treated with antibiotics, O2, nebulizers  and we will continue steroids with a rapid taper.  The patient will be given  IV fluids and we will obtain a followup chest x-ray after  IV fluids.  We  will also obtain an ABG and consider CT of the chest if warranted.  As for  the patient's diabetes, we will continue the patient on sliding-scale  insulin and because the patient has some renal insufficiency, we will hold  Glucophage and ACE inhibitor and check followup labs after IV hydration.  We  will make further recommendations after review of the above studies.      RDP/MEDQ  D:  03/16/2005  T:  03/16/2005  Job:  [id number]   cc:   Jethro Bastos, M.D.  [address]  [place]  [place] [address]  Fax: [phone]

## 2011-04-29 NOTE — Discharge Summary (Signed)
NAME:  Sean Saunders, Sean Saunders               ACCOUNT NO.:  [id number]   MEDICAL RECORD NO.:  [id number]          PATIENT TYPE:  INP   LOCATION:  5506                         FACILITY:  [facility]   PHYSICIAN:  Lonia Blood, M.D.       DATE OF BIRTH:  03/14/1946   DATE OF ADMISSION:  12/25/2006  DATE OF DISCHARGE:  12/29/2006                               DISCHARGE SUMMARY   PRIMARY CARE PHYSICIAN:  Dr. Renae Gloss.   DISCHARGE DIAGNOSES:  1. Chronic obstructive pulmonary disease exacerbation.  2. Diabetes mellitus type 2.  3. Peripheral vascular disease status post aortofemoral bypass      grafting.  4. Hypothyroidism.   DISCHARGE MEDICATIONS:  1. Lisinopril 40 mg daily.  2. Lasix 20 mg daily.  3. Coreg 6.25 mg twice a day.  4. Levothyroxine 0.137 mg daily.  5. Avandia 8 mg daily.  6. Glucophage XR 2 grams twice a day.  7. Lantus 100 units at bedtime.  8. Januvia 100 mg daily.  9. Zetia 10 mg a daily.  10.Zocor 40 mg daily.  11.Cymbalta 60 mg daily.  12.Prevacid 30 mg daily.  13.Spiriva 1 capsule daily.  14.Advair 250/50 1 puff twice a day.  15.Proventil one puff 4 times a day as needed.  16.Prednisone taper.  17.Avelox 400 mg daily for 5 days.   CONDITION ON DISCHARGE:  Sean Saunders was discharged in good condition.  At  the time of discharge, he was having good oxygen saturations on room  air, and his vital signs were stable.  The patient was instructed to  follow up with his primary care physician, Dr. Renae Gloss, within a week  after discharge.  The patient was also instructed to quit smoking  cigarettes.   PROCEDURES DURING THIS ADMISSION:  On December 25, 2006, the patient  underwent a chest x-ray with no acute abnormalities, COPD.   CONSULTATION:  No consultations were obtained.   For admission history and physical, please refer to the dictated H&P  done by Dr. Angelena Sole on December 25, 2006.   HOSPITAL COURSE:  1. Chronic obstructive pulmonary disease exacerbation.  Sean Saunders was  admitted to [facility] acute care unit with complaints of wheezing      and shortness of breath.  The physical examination was pointing      towards a COPD exacerbation.  The patient was treated with      intravenous Solu-Medrol, as well as intravenous antibiotics and      nebulizer treatments 3 times a day.  The patient also received      oxygen therapy.  Sputum culture and blood cultures were sent, and      there was no growth found.  The patient became afebrile, and his      respiratory status improved gradually.  By December 29, 2006, we      felt that the patient was safe for discharge on a prednisone taper,      to initiate treatment with Spiriva, Advair, as well as Proventil in      the outpatient setting.  We have extensively educated Sean Saunders  about the dangers of continuing smoking, and the patient voiced      understanding and willingness to quit using cigarettes.  2. Diabetes mellitus.  During this hospitalization, we have increased      the patient's insulin, due to the use of the steroids.  As the      steroids are tapered off, the patient's insulin requirement might      decrease again.  We have also continued the patient on Januvia and      Metformin, as well as Avandia at the time of discharge.      Lonia Blood, M.D.  Electronically Signed     SL/MEDQ  D:  01/05/2007  T:  01/05/2007  Job:  [id number]   cc:   Merlene Laughter. Renae Gloss, M.D.

## 2011-04-29 NOTE — Op Note (Signed)
NAME:  Sean Saunders, Sean Saunders               ACCOUNT NO.:  [id number]   MEDICAL RECORD NO.:  [id number]          PATIENT TYPE:  AMB   LOCATION:  SDS                          FACILITY:  [facility]   PHYSICIAN:  Di Kindle. Edilia Bo, M.D.DATE OF BIRTH:  06-05-1946   DATE OF PROCEDURE:  08/08/2005  DATE OF DISCHARGE:                                 OPERATIVE REPORT   REASON FOR PROCEDURE:  Progressive ischemia of the right lower extremity.   PROCEDURE:  1.  Aortogram.  2.  Bilateral iliac arteriogram.  3.  Bilateral lower extremity runoff.   SURGEON:  Edilia Bo.   ANESTHESIA:  Local with sedation.   TECHNIQUE:  The patient was taken to the PV lab at [facility] and sedated with a  milligram of Versed and 50 mcg of fentanyl. This was later repeated. The  left groin was prepped and draped in the usual sterile fashion.  After the  skin was infiltrated with 1% lidocaine and with assistance of the Doppler,  the left common femoral artery was cannulated and guidewire introduced into  the infrarenal aorta under fluoroscopic control. The 5-French sheath was  passed over the wire and dilator removed. Pigtail catheter was positioned at  the L1 vertebral body and flush aortogram obtained. The catheter was then  repositioned above the aortic bifurcation and oblique iliac projection was  obtained, bilateral lower extremity runoff films were obtained. Pressure  gradient was measured across a left iliac stenosis and this was resting  gradient of 15-20 mmHg systolic pressure gradient.   FINDINGS:  The superior mesenteric artery and celiac artery, celiac axis are  widely patent on the lateral projection. There are single renal arteries  bilaterally with no significant renal artery stenosis identified.  The  infrarenal aorta is patent with no significant stenosis noted except for  slight defect below the level of the takeoff of the IMA which is patent. The  right common iliac artery is occluded at its origin and is a  large  collateral which reconstitutes the external iliac artery and then retrograde  fills the hypogastric artery on the right. On the left side, there is a  moderate stenosis of the common iliac artery with pressure gradient as  described above. There is a long stenosis of the proximal hypogastric artery  on the left.  The external iliac arteries are patent bilaterally. The common  femoral, superficial femoral and deep femoral arteries were patent  bilaterally.  Popliteal arteries are patent and the proximal tibial vessels  were all patent. There was poor visualization of tibials distally.   CONCLUSIONS:  1.  Occluded right common iliac artery.  2.  Moderate left common iliac artery stenosis which produces a significant      resting systolic gradient of 15-20 mmHg.  3.  Left hypogastric artery stenosis and mild proximal left SFA stenosis.      Di Kindle. Edilia Bo, M.D.  Electronically Signed     CSD/MEDQ  D:  08/08/2005  T:  08/08/2005  Job:  [id number]   cc:   Candyce Churn. Allyne Gee, M.D.  [address]  [address]  [place]  [place] [address]  Fax: [phone]

## 2011-04-29 NOTE — Discharge Summary (Signed)
NAME:  Sean Saunders, Sean Saunders               ACCOUNT NO.:  [id number]   MEDICAL RECORD NO.:  [id number]          PATIENT TYPE:  INP   LOCATION:  2007                         FACILITY:  [facility]   PHYSICIAN:  Rowe Clack, P.A.-C. DATE OF BIRTH:  [birth year]   DATE OF ADMISSION:  08/11/2005  DATE OF DISCHARGE:  08/17/2005                                 DISCHARGE SUMMARY   HISTORY OF PRESENT ILLNESS:  The patient is a 65 year old gentleman who has  been followed with aortoiliac occlusive disease.  He had a long history of  claudication in the right lower extremity and had a known right common iliac  artery occlusion. He also had some moderate disease in the left common iliac  artery.  His symptoms progressed significantly and were affecting his  lifestyle.  Additionally, he developed progressive rest pain in the right  foot.  Given the progression of his ischemia, he was felt to be a candidate  for re-arteriography which revealed that he had a common iliac artery  occlusion on the right.  On the left side he had moderate common iliac  stenosis which had a significant resting gradient.  Given his age, it was  felt that the best long term success would be with performing an  aortobifemoral bypass, and the patient was admitted this hospitalization by  Dr. Edilia Bo for the procedure.   PAST MEDICAL HISTORY:  1.  Adult onset diabetes, insulin dependent.  2.  Hypertension.  3.  Hypercholesterolemia.  4.  Hypothyroidism.  5.  No history of previous MI or CHF.  He had a recent stress test within      the last three months which he states was negative.   FAMILY/SOCIAL HISTORY/REVIEW OF SYMPTOMS/PHYSICAL EXAMINATION:  Please see  History and Physical done at the time of admission.   MEDICATIONS PRIOR TO ADMISSION:  1.  Lasix 20 mg daily.  2.  Lisinopril 40 mg daily.  3.  Levothyroxine 0.125 mg daily.  4.  Glucophage XR 500 mg b.i.d.  5.  Lantus insulin 10 units subcu q.h.s.  6.  Glipizide XL 5 mg  q.a.m.  7.  Zetia 10 mg p.o. q.a.m.  8.  Zocor 40 mg p.o. q.a.m.  9.  Enteric coated aspirin 81 mg daily.   ALLERGIES:  No known drug allergies.   HOSPITAL COURSE:  The patient was admitted electively and on August 11, 2005, taken to the operating room where he underwent the following  procedure.  Aortobifemoral bypass grafting with a 14x7 heme shield graft.  The procedure was performed by Dr. Waverly Ferrari.  The patient  tolerated the procedure well and was taken to the post anesthesia care unit  in stable condition.   POSTOPERATIVE HOSPITAL COURSE:  The patient has done well.  He has remained  hemodynamically stable.  His routine lines monitors were discontinued in  standard fashion.  Additionally, his NG tube was discontinued early in the  postoperative course.  He has had full resolution of bowel function and is  tolerating a regular diet.  Incisions are healing well without evidence of  infection.  From a vascular view point, his feet are warm and well perfused  with palpable posterior tibial bypasses bilaterally.  Ankle brachial index  done postoperatively is 0.88 on the right and 0.88 on the left.  The patient  did have some postoperative confusion which was temporary and has resolved.  His heart rhythm has remained stable in normal sinus rhythm.  His capillary  blood glucose monitoring has been somewhat elevated in number, but has  improved with resumption of his preop regimen.  He has been followed closely  with sliding scale insulin.  His laboratory values are stable.  He has a BUN  of 14 and creatinine of 1.0 on August 16, 2005.  His hemoglobin and  hematocrit on August 14, 2005, were 11.9 and 35.0, respectively.  His  overall status is felt to be quite adequate.  Discharge on today's date  August 17, 2005.   FINAL DIAGNOSES:  Severe aortoiliac occlusive disease now status post  revascularization as described.  Other diagnoses as previously listed per  the  history.   DISCHARGE MEDICATIONS:  As preoperatively additionally for pain, Tylox 1-2  q.6 h p.r.n.   DISCHARGE INSTRUCTIONS:  The patient received written instructions regarding  medications, activity, diet, wound care and follow up.   FOLLOWUP:  Dr. Edilia Bo in 2 weeks.  Additional appointment is arranged for  staple removal at the [facility] office.      Rowe Clack, P.A.-C.     Sherryll Burger  D:  08/17/2005  T:  08/17/2005  Job:  [id number]   cc:   Di Kindle. Edilia Bo, M.D.  [address]  [place]  [place] [address]

## 2011-04-29 NOTE — Discharge Summary (Signed)
NAME:  Sean Saunders, Sean Saunders               ACCOUNT NO.:  [id number]   MEDICAL RECORD NO.:  [id number]          PATIENT TYPE:  INP   LOCATION:  0348                         FACILITY:  [facility]   PHYSICIAN:  Melissa L. Ladona Ridgel, MD  DATE OF BIRTH:  Dec 17, 1945   DATE OF ADMISSION:  03/15/2005  DATE OF DISCHARGE:  03/19/2005                                 DISCHARGE SUMMARY   DISCHARGING DIAGNOSES:  1.  Acute bronchitis.  2.  Probable exacerbation of underlying chronic obstructive pulmonary      disease.  3.  Diabetes, poorly controlled after receiving steroids, now controlled.  4.  Hypothyroidism.  The patient will remain on his Synthroid 100 mcg daily.  5.  Hypertension, currently controlled on Norvasc, lisinopril, and      hydrochlorothiazide.  6.  Hyperlipidemia.  Continue Zocor and Zetia.  7.  Smoking cessation.  The patient states that he is quitting cold [place].      We have offered him cessation techniques and information but at this      time, he states that he will do it on his own.  He has been instructed      that if was to resume his smoking or be around his wife, who smokes,      that it may exacerbate his underlying pulmonary condition at this time      and end up back in the hospital.   DISCHARGE MEDICATIONS:  1.  Albuterol 2.5 mg nebulizer q.6h.  2.  Atrovent 0.5 mg nebulizer q.6h.  3.  Budesonide 0.5 mg nebulizer q.12h.  4.  Synthroid 100 mcg daily.  5.  Protonix 40 mg daily.  6.  Hydrochlorothiazide 25 mg daily.  7.  Lisinopril 40 mg daily.  8.  Norvasc 10 mg daily.  9.  Glucophage 500 mg daily.  10. Glucotrol 5 mg daily.  11. Zetia 10 mg daily.  12. Zocor 30 mg q.h.s.  13. Tussionex 5 mL q.12h.  14. Guaifenesin 500 mg q.12h.  15. Lantus 94-96 units subcutaneously q.h.s.  16. Avelox 400 mg daily x 12 more days.   The patient should make an appointment to see Dr. Clarene Duke the middle of next  week to assess his current pulmonary progression.  He is to return to the  hospital if he becomes more short of breath, develops, fever, chills,  nausea, vomiting, or diarrhea.   HISTORY OF PRESENT ILLNESS:  The patient is a 65 year old white male with  heavy tobacco abuse.  He also has a history of diabetes, hypertension, and  peripheral vascular disease as well as depression.  Presented to the  emergency room with complaints of cough, shortness of breath which he has  been having on and off of for about 2 days prior to admission.  At the time  of admission, his paroxysms of cough had been nonproductive and quite  fierce.  Of note, his wife is also ill with cough and mild shortness of  breath.  In the emergency room, the patient was found to have a temperature  of 102.8.  He was tachycardic related to his  excessive cough paroxysms.  X-  rays were undertaken which showed no obvious infiltrates.  His CBC did show  a leukocytosis.  Because of the persistent nature of his symptoms and  decreased saturations and altered vital signs, the patient was admitted for  further treatment.  In the emergency room, the patient was treated with  antibiotics, O2, and nebulizers.  He was also given IV Solu-Medrol.  He did  not have excessive wheezing.  Therefore, he was not placed on a standing  dose of steroids.  The patient was admitted to the telemetry floor and  progressed to improved respiratory status without complication.  Initially  after steroids, the patient's blood glucoses were elevated, but they came  under control after resuming his Glucophage and Lantus.  On the day of  discharge, the patient appears stable.  He has been off oxygen for 48 hours  and has had no fever in the past 36-48 hours.  He states that his  respiratory status feels well and that he feels he can tolerate home therapy  at this time.  Of note, the patient was initially started on steroids;  however, it was felt that he did not need a full course of steroids despite  the initial plan to continue  that therapy.  I have elected to use inhaled  budesonide which gave him significant relief.   PHYSICAL EXAMINATION:  VITAL SIGNS:  On the day of discharge, the patient's  vital signs remained stable.  His temperature is 98.  Blood pressure is  133/70.  Pulse is 80.  Respirations are 18-20, saturating 91-92% on room air  at rest and with ambulation, he drops to 90.  GENERAL:  His physical exam remains mildly obese with probable rosacea on  his bilateral cheeks.  HEENT:  Mucous membranes are moist.  Pupils are equal, round, and reactive  to light.  Extraocular muscles are intact.  NECK:  Supple.  There is no JVD, no lymph nodes, no carotid bruits, no  thyromegaly.  CHEST:  Showing decreased breath sounds but better air entry than on  admission.  He has a little bit of crackles at the right base but no  wheezing and no significant rales.  CARDIOVASCULAR:  Regular rate, rhythm.  Positive S1 and S2.  No S3, S4.  No  murmurs, rubs, or gallops.  ABDOMEN:  Soft, nontender, nondistended with positive bowel sounds.  EXTREMITIES:  No cyanosis, clubbing, or edema.   Pertinent values during the course of the hospital stay reveal CBGs of 185,  103, and 115.  His most recent BUN is 14 with a creatinine of 1.1.  This is  significantly improved from admission, at which time he appeared to be  volume contracted.  His BUN was 21; his creatinine was 1.7.  He was then  rehydrated with good results.  His hemoglobin is 12.3 with hematocrit of  36.4.  White count on discharge is 10.  Platelet count is 193.  His urine  culture showed no growth.  Respiratory culture showed normal oropharyngeal  flora.  Blood cultures have been negative x 2 sets.  His BNP was less than  30.  His Influenza A&B were negative.  ABG on admission showed a pH of 7.38  with a pCO2 of 32.7, pO2 69.5, bicarb 18.9, oxygen saturation 94.4.   At this time, the patient is deemed improved and stable for discharge with follow up to Dr.  Clarene Duke early next week.  He is to return if  he develops  worsening cough or fever.  We will discharge him to home on nebulizers which  have significantly improved his condition.  The patient states that he will  not resume smoking, and we have offered assistance with smoking cessation,  but he has declined that help.  I have also discussed smoking cessation with  his wife.  They both are heavy smokers.  We will provide the patient with a  home nebulizer through [facility], and Dr. Clarene Duke can evaluate him  for length of need for nebulizers.  He will complete a course of 12 more  days  of Avelox.  Of note, the patient's ambulatory and resting saturations are  sufficient.  He did not require home oxygen at this time.   At the time of discharge, the patient's condition is stable.      MLT/MEDQ  D:  03/19/2005  T:  03/19/2005  Job:  [id number]   cc:   Caryn Bee L. Little, M.D.  [address]  [place]  [place] [address]  Fax: [phone]

## 2011-04-29 NOTE — H&P (Signed)
NAME:  Sean Saunders, Sean Saunders               ACCOUNT NO.:  [id number]   MEDICAL RECORD NO.:  [id number]          PATIENT TYPE:  AMB   LOCATION:                               FACILITY:  [facility]   PHYSICIAN:  Di Kindle. Edilia Bo, M.D.DATE OF BIRTH:  [birth year]   DATE OF ADMISSION:  08/08/2005  DATE OF DISCHARGE:                                HISTORY & PHYSICAL   REASON FOR ADMISSION:  Progressive right lower extremity ischemia with  aortoiliac occlusive disease.   HISTORY:  This is a pleasant 65 year old gentleman who I have been following  with aortoiliac occlusive disease.  He has a known right iliac artery  occlusion with a moderate left common iliac artery stenosis.  I had actually  scheduled him for aortofemoral bypass grafting back in 2002; however, when  he came in for surgery his blood sugars were markedly elevated.  I believe  his CBG was over 600.  Given that his blood sugars were out of control the  surgery was cancelled and then he was later lost to follow-up.  He returned  on August 23 with progressive claudication symptoms in the right leg which  have become significantly disabling.  He experiences claudication in the  calves and thighs and both lower extremities, but most significantly on the  right side.  He has also developed rest pain in the right foot.  He has no  history of nonhealing wounds.   PAST MEDICAL HISTORY:  1.  Adult-onset diabetes.  He is insulin-dependent.  2.  Hypertension.  3.  Hypercholesterolemia.  4.  Hypothyroidism.  5.  He denies any history of previous myocardial infarction or history of      congestive heart failure.  He has had a recent stress test within the      last three months which he states was negative.   FAMILY HISTORY:  There is no history of premature cerebrovascular disease.   SOCIAL HISTORY:  He is married and has one child.  He is the Cabin crew at  [employer].  He smokes a half a pack per day of cigarettes and has been  smoking for approximately 10 years.   MEDICATIONS:  1.  Lasix 20 mg p.o. daily.  2.  Lisinopril 40 mg p.o. daily.  3.  Levothyroxine 0.125 mg p.o. daily.  4.  Glucophage XR 500 mg p.o. b.i.d.  5.  Lantus insulin 10 units subcutaneous q.h.s.  6.  Glipizide XL 5 mg p.o. q.a.m.  7.  Zetia 10 mg p.o. q.a.m.  8.  Zocor 40 mg p.o. q.a.m.  9.  Enteric-coated aspirin 81 mg p.o. daily.  10. MVI one p.o. daily.   ALLERGIES:  No known drug allergies.   REVIEW OF SYSTEMS:  GENERAL:  He has had no weight loss, weight gain,  problems with his appetite, or fever.  He is 235 pounds, 6 feet tall.  CARDIAC:  He has had no chest pain, chest pressure, palpitations, or  arrhythmias.  He does admit to some dyspnea on exertion.  PULMONARY:  He has  had no productive cough, bronchitis, asthma, or wheezing.  GI:  He has had  no recent change in his bowel habits and has no history of peptic ulcer  disease or hiatal hernia.  GU:  He has had no dysuria or frequency.  VASCULAR:  He has bilateral lower extremity claudication more significantly  on the right side.  He has rest pain in the right foot.  He has had no  previous history of DVT or phlebitis.  He has had no previous history of  stroke, TIAs, expressive/receptive aphasia, or amaurosis fugax.  NEUROLOGIC:  He has had no dizziness, blackouts, headaches, or seizures.  ORTHO/SKIN:  He  has had some joint pain and muscle pain.  PSYCHIATRIC:  He has had no  depression or nervousness.  HEMATOLOGIC:  He has had no bleeding problems or  clotting disorders that he is aware of.   PHYSICAL EXAMINATION:  VITAL SIGNS:  Blood pressure 148/80, heart rate 76.  NECK:  He has soft bilateral carotid bruits.  LUNGS:  Clear bilaterally to auscultation.  CARDIAC:  Regular rate and rhythm.  ABDOMEN:  Soft and nontender.  He is somewhat obese.  EXTREMITIES:  He has palpable left femoral pulse with no right femoral  pulse.  The left femoral pulse is diminished.  I cannot  palpate popliteal or  pedal pulses in the right foot.  The left foot has brisk Doppler signals.  He has no significant lower extremities swelling.  He has no ischemic  ulcers.  He does have some varicose veins bilaterally.   He did have a preoperative carotid duplex scan which shows no internal  carotid artery stenoses on either side.  He has had arterial Doppler studies  which show an ABI of 51% on the right and 90% on the left.   Given the progression of his symptoms, especially on the right side, he  would like to pursue revascularization.  He has developed the rest pain in  the right foot and this has become a limb-threatening problem.  In addition,  his symptoms are significantly disabling.  He has undergone an arteriography  which demonstrates a right common iliac artery occlusion with a moderate  left common iliac artery stenosis.  There was a significant gradient across  the iliac stenosis on the left.  As he is fairly young I have recommended  aortofemoral bypass grafting as his best option for long-term success.  We  have discussed the indications for the procedure and the potential  complications including, but not limited to, bleeding, wound healing  problems, graft infection, MI, renal insufficiency.  He understands his risk  of major morbidity and mortality is approximately 4%.  All of his questions  were answered and he is agreeable to proceed.  His surgery has been  scheduled for August 11, 2005.      Di Kindle. Edilia Bo, M.D.  Electronically Signed     CSD/MEDQ  D:  08/08/2005  T:  08/08/2005  Job:  [id number]   cc:   Candyce Churn. Allyne Gee, M.D.  [address]  [address]  [place]  [place] [address]  Fax: [phone]

## 2011-07-22 ENCOUNTER — Encounter: Payer: Self-pay | Admitting: Physician Assistant

## 2011-07-22 ENCOUNTER — Encounter (INDEPENDENT_AMBULATORY_CARE_PROVIDER_SITE_OTHER): Payer: Medicare Other

## 2011-07-22 ENCOUNTER — Other Ambulatory Visit (INDEPENDENT_AMBULATORY_CARE_PROVIDER_SITE_OTHER): Payer: Self-pay | Admitting: Otolaryngology

## 2011-07-22 ENCOUNTER — Ambulatory Visit (INDEPENDENT_AMBULATORY_CARE_PROVIDER_SITE_OTHER): Payer: Medicare Other | Admitting: Physician Assistant

## 2011-07-22 VITALS — BP 176/86 | HR 67

## 2011-07-22 DIAGNOSIS — I739 Peripheral vascular disease, unspecified: Secondary | ICD-10-CM

## 2011-07-22 DIAGNOSIS — D333 Benign neoplasm of cranial nerves: Secondary | ICD-10-CM

## 2011-07-22 DIAGNOSIS — R0989 Other specified symptoms and signs involving the circulatory and respiratory systems: Secondary | ICD-10-CM

## 2011-07-22 NOTE — Progress Notes (Signed)
Subjective:     Patient ID: Sean Saunders, male   DOB: 05-Apr-1946, 65 y.o.   MRN: [id number]  HPI Pt presents at request of podiatrist to eval left toe wound and no palp pulses in the left foot.  Pt states he first noted redness and soreness in the left great toe approx 1 week ago.  He presented to his PCP for eval and was told he had an infection in the toe.  He was given a course of Amoxicillin and instructed to f/u in a few days.  At f/u he said it was not improved, therefore, he was referred to a podiatrist and given a course of Augmentin. He was evaled by the podiatrist today  was instructed to present here urgently for toe wound with no pulses in the foot.  The pt is s/p ABFBG in 2006 by Dr. Edilia Bo and was recently evaled by him in March for carotid disease.  Carotid stenosis is 40-59% on the left.    Pt states that he believes the redness and soreness in his toe has improved over the past day or so.  He does have claudication at approx 25 yards that resolves with rest after 2-3 minutes.  It is not life limiting.  He denies rest pain but does have neuropathy.  He also states that he will have some "purplish" discoloration of the toes on his left on occasion.  This is not associated with pain or any specific time or position.    Past Medical History  Diagnosis Date  . Hypertension   . Diabetes mellitus   . COPD (chronic obstructive pulmonary disease)   . Hyperlipidemia   . Thyroid disease    Past Surgical History  Procedure Date  . Pr vein bypass graft,aorto-fem-pop   . Appendectomy   . Joint replacement hip      Review of Systems  Constitutional: Negative.   HENT: Negative.   Respiratory: Positive for shortness of breath. Negative for apnea, cough, choking, chest tightness, wheezing and stridor.   Cardiovascular: Negative.   Gastrointestinal: Negative.   Musculoskeletal: Negative.   Neurological: Negative.   Hematological: Negative.   Psychiatric/Behavioral: Negative.      Objective:   Physical Exam  Constitutional: He is oriented to person, place, and time. He appears well-developed and well-nourished.  HENT:  Head: Normocephalic and atraumatic.  Eyes: EOM are normal.  Neck: Normal range of motion.  Cardiovascular: Normal rate and regular rhythm.   No murmur heard. Pulmonary/Chest: Effort normal and breath sounds normal.  Abdominal: Soft. Bowel sounds are normal.  Musculoskeletal: Normal range of motion.       2+ bilat fem pulses.  Right LE warm, well profused.  Left LE pedals not palp.  Doppler signal in DP, PT.  Varicosities in bilat LE.  Right great toe with half moon shaped area of erythema directly beneath nail bed.  Onychomycosis present in right great toe as well.  No fluctuance noted.  Slightly tender to palp.  Good cap refill all toes.  Mild redness all toes.  No open wounds.  Neurological: He is alert and oriented to person, place, and time.  Skin: Skin is warm and dry.  Psychiatric: His behavior is normal.   ABIs reveal 1.06 right and 0.53 left.  Triphasic flow right and monophasic flow left.       Assessment:     Onychomycosis and possible bacterial infection right great toe PAD with claudication in pt with DM and hx of ABFBG  Plan:    Pt to request antifungal from PCP for right great toe; continue ABx as prescribed.  Will schedule aortogram with bilateral LE runoff and possible left leg intervention within 1-2 weeks with Dr. Edilia Bo.  Dr. Imogene Burn will likely assist as an antegrade approach will be necessary with previous ABFBG.      Dr Imogene Burn was MD in clinic today.

## 2011-07-29 ENCOUNTER — Ambulatory Visit
Admission: RE | Admit: 2011-07-29 | Discharge: 2011-07-29 | Disposition: A | Discharge status: Home / Self Care | Payer: Medicare Other | Source: Ambulatory Visit | Attending: Otolaryngology | Admitting: Otolaryngology

## 2011-07-29 DIAGNOSIS — D333 Benign neoplasm of cranial nerves: Secondary | ICD-10-CM

## 2011-07-29 MED ORDER — GADOBENATE DIMEGLUMINE 529 MG/ML IV SOLN
20.0000 mL | Freq: Once | INTRAVENOUS | Status: AC | PRN
  Administered 2011-07-29: 20 mL via INTRAVENOUS

## 2011-08-01 ENCOUNTER — Ambulatory Visit (HOSPITAL_COMMUNITY)
Admission: RE | Admit: 2011-08-01 | Discharge: 2011-08-01 | Disposition: A | Discharge status: Home / Self Care | Payer: Medicare Other | Source: Ambulatory Visit | Attending: Vascular Surgery | Admitting: Vascular Surgery

## 2011-08-01 DIAGNOSIS — L98499 Non-pressure chronic ulcer of skin of other sites with unspecified severity: Secondary | ICD-10-CM | POA: Insufficient documentation

## 2011-08-01 DIAGNOSIS — I739 Peripheral vascular disease, unspecified: Secondary | ICD-10-CM | POA: Insufficient documentation

## 2011-08-01 DIAGNOSIS — I70219 Atherosclerosis of native arteries of extremities with intermittent claudication, unspecified extremity: Secondary | ICD-10-CM

## 2011-08-01 DIAGNOSIS — L97509 Non-pressure chronic ulcer of other part of unspecified foot with unspecified severity: Secondary | ICD-10-CM | POA: Insufficient documentation

## 2011-08-01 DIAGNOSIS — I798 Other disorders of arteries, arterioles and capillaries in diseases classified elsewhere: Secondary | ICD-10-CM | POA: Insufficient documentation

## 2011-08-01 DIAGNOSIS — E1159 Type 2 diabetes mellitus with other circulatory complications: Secondary | ICD-10-CM | POA: Insufficient documentation

## 2011-08-01 HISTORY — PX: PR VEIN BYPASS GRAFT,AORTO-FEM-POP: 35551

## 2011-08-01 LAB — GLUCOSE, CAPILLARY
Glucose-Capillary: 96 mg/dL (ref 70–99)
Glucose-Capillary: 82 mg/dL (ref 70–99)
Glucose-Capillary: 77 mg/dL (ref 70–99)

## 2011-08-01 LAB — POCT I-STAT, CHEM 8
BUN: 24 mg/dL — ABNORMAL HIGH (ref 6–23)
Calcium, Ion: 1.15 mmol/L (ref 1.12–1.32)
Chloride: 108 mEq/L (ref 96–112)
Creatinine, Ser: 1.4 mg/dL — ABNORMAL HIGH (ref 0.50–1.35)
Glucose, Bld: 108 mg/dL — ABNORMAL HIGH (ref 70–99)
HCT: 45 % (ref 39.0–52.0)
Hemoglobin: 15.3 g/dL (ref 13.0–17.0)
Potassium: 4.8 mEq/L (ref 3.5–5.1)
Sodium: 140 mEq/L (ref 135–145)
TCO2: 22 mmol/L (ref 0–100)

## 2011-08-02 ENCOUNTER — Encounter: Payer: Self-pay | Admitting: Surgery

## 2011-08-03 ENCOUNTER — Other Ambulatory Visit (INDEPENDENT_AMBULATORY_CARE_PROVIDER_SITE_OTHER): Payer: Medicare Other

## 2011-08-03 ENCOUNTER — Encounter: Payer: Self-pay | Admitting: *Deleted

## 2011-08-03 ENCOUNTER — Ambulatory Visit (INDEPENDENT_AMBULATORY_CARE_PROVIDER_SITE_OTHER): Payer: Medicare Other | Admitting: Vascular Surgery

## 2011-08-03 ENCOUNTER — Encounter: Payer: Self-pay | Admitting: Vascular Surgery

## 2011-08-03 VITALS — BP 149/74 | HR 71 | Resp 16 | Ht 72.0 in | Wt 246.6 lb

## 2011-08-03 DIAGNOSIS — Z01810 Encounter for preprocedural cardiovascular examination: Secondary | ICD-10-CM

## 2011-08-03 DIAGNOSIS — I6529 Occlusion and stenosis of unspecified carotid artery: Secondary | ICD-10-CM

## 2011-08-03 DIAGNOSIS — I739 Peripheral vascular disease, unspecified: Secondary | ICD-10-CM

## 2011-08-03 DIAGNOSIS — G459 Transient cerebral ischemic attack, unspecified: Secondary | ICD-10-CM

## 2011-08-03 DIAGNOSIS — I743 Embolism and thrombosis of arteries of the lower extremities: Secondary | ICD-10-CM

## 2011-08-03 DIAGNOSIS — I7092 Chronic total occlusion of artery of the extremities: Secondary | ICD-10-CM

## 2011-08-03 HISTORY — DX: Transient cerebral ischemic attack, unspecified: G45.9

## 2011-08-03 NOTE — Progress Notes (Signed)
Subjective:     Patient ID: Sean Saunders, male   DOB: 1946/11/16, 65 y.o.   MRN: [id number]  HPI  This is a pleasant 65 year old gentleman who I performed an aortobifemoral bypass graft on August of 2006. He was seen by a manner Yarbro in the office on 07/22/2011 with a wound on his left great toe. He underwent an arteriogram via Dr. Imogene Burn which showed a left superficial femoral artery occlusion. He was set up for an office visit with me to discuss left femoropopliteal bypass grafting. He states that he developed a wound under his left great toenail approximately 2 weeks ago. His podiatrist noted that there was no palpable pulses in the left foot is why he was sent to our office for vascular consultation. He has had stable claudication of the left calf approximately 6 months. His symptoms are brought on by ambulation and relieved with rest. He denies any history of rest pain.  I had last seen him in the office on 03/02/2011 after he had 3 TIAs in February and March involving left-sided weakness. Rotted duplex at that time showed no significant carotid stenosis on the right with a 40-59% left carotid stenosis. He was due for a followup carotid duplex scan in September of this year. His had no further history of stroke, TIAs, expressive or receptive aphasia, or amaurosis fugax.   Past Medical History  Diagnosis Date  . Hypertension   . Diabetes mellitus   . COPD (chronic obstructive pulmonary disease)   . Hyperlipidemia   . Thyroid disease   . TIA (transient ischemic attack) 08/03/2011    Left sided weakness. 3 episodes Feb-March    Family History  Problem Relation Age of Onset  . Stroke Mother   . Diabetes Mother   . Other Father     alzheimers    History  Substance Use Topics  . Smoking status: Current Some Day Smoker -- 0.2 packs/day for 5 years    Types: Cigarettes  . Smokeless tobacco: Not on file   Comment: quit in 2006 and now smokes occas. cigarette  . Alcohol Use: Yes   rare    Allergies  Allergen Reactions  . Lunesta   . Niaspan (Niacin (Antihyperlipidemic))     Current Outpatient Prescriptions  Medication Sig Dispense Refill  . amLODipine (NORVASC) 5 MG tablet Take 5 mg by mouth daily.        Marland Kitchen azithromycin (ZITHROMAX) 250 MG tablet Take 250 mg by mouth daily.        . clopidogrel (PLAVIX) 75 MG tablet Take 75 mg by mouth daily.        . DimenhyDRINATE (MOTION SICKNESS PO) Take 25 mg by mouth.        . ezetimibe (ZETIA) 10 MG tablet Take 10 mg by mouth daily.        . felodipine (PLENDIL) 10 MG 24 hr tablet Take 10 mg by mouth daily.        . Fingerstix Lancets MISC by Does not apply route.        . furosemide (LASIX) 20 MG tablet Take 20 mg by mouth daily.        Marland Kitchen gabapentin (NEURONTIN) 600 MG tablet Take 600 mg by mouth at bedtime.        . insulin regular (HUMULIN R,NOVOLIN R) 100 units/mL injection Inject 35 Units into the skin. Take 35 units in am/bkfst. and 10 units in pm/supper      . Insulin Syringe-Needle U-100 [facility] INSULIN  SYRINGE) 31G X 3/8" 1 ML MISC by Does not apply route 2 (two) times daily.        . irbesartan (AVAPRO) 300 MG tablet Take 300 mg by mouth at bedtime.        Marland Kitchen L-Methylfolate-B6-B12 (METANX PO) Take by mouth.        . levothyroxine (SYNTHROID, LEVOTHROID) 137 MCG tablet Take 137 mcg by mouth daily.        . metFORMIN (GLUCOPHAGE) 1000 MG tablet Take 1,000 mg by mouth 2 (two) times daily with a meal.        . nebivolol (BYSTOLIC) 10 MG tablet Take 10 mg by mouth daily.        Marland Kitchen omeprazole (PRILOSEC) 40 MG capsule Take 40 mg by mouth daily.        . simvastatin (ZOCOR) 20 MG tablet Take 20 mg by mouth at bedtime.        . sitaGLIPtin (JANUVIA) 100 MG tablet Take 100 mg by mouth daily.        Marland Kitchen tiotropium (SPIRIVA) 18 MCG inhalation capsule Place 18 mcg into inhaler and inhale daily.        Marland Kitchen zolpidem (AMBIEN) 5 MG tablet Take 5 mg by mouth at bedtime as needed.        Marland Kitchen aspirin 81 MG tablet Take 81 mg by mouth daily.         . cyclobenzaprine (FLEXERIL) 10 MG tablet Take 10 mg by mouth 2 (two) times daily as needed.        . fluticasone (FLONASE) 50 MCG/ACT nasal spray Place 2 sprays into the nose daily.        Marland Kitchen loratadine (CLARITIN) 10 MG tablet Take 10 mg by mouth daily.          Review of Systems  Constitutional: Negative for fever, chills and appetite change.  Respiratory: Negative for cough, chest tightness, shortness of breath and wheezing.   Cardiovascular: Negative for chest pain, palpitations and leg swelling.  Gastrointestinal: Negative for nausea, vomiting, diarrhea, constipation and blood in stool.  Genitourinary: Negative for dysuria, frequency and hematuria.  Musculoskeletal: Positive for arthralgias. Negative for myalgias.  Skin: Negative for rash and wound.  Neurological: Positive for headaches. Negative for dizziness, seizures, speech difficulty, weakness and numbness.  Hematological: Does not bruise/bleed easily.  Psychiatric/Behavioral: Negative for confusion.       Objective:   Physical Exam  Constitutional: He is oriented to person, place, and time. He appears well-developed and well-nourished.  HENT:  Head: Normocephalic and atraumatic.  Neck: Neck supple. No JVD present. No thyromegaly present.  Cardiovascular: Normal rate, regular rhythm and normal heart sounds.  Exam reveals no friction rub.   No murmur heard. Pulses:      Radial pulses are 2+ on the right side, and 2+ on the left side.       Femoral pulses are 2+ on the right side, and 2+ on the left side.      Popliteal pulses are 2+ on the right side, and 0 on the left side.       Dorsalis pedis pulses are 0 on the left side.       Posterior tibial pulses are 2+ on the right side, and 0 on the left side.  Pulmonary/Chest: Breath sounds normal. He has no wheezes. He has no rales.  Abdominal: Soft. Bowel sounds are normal. There is no tenderness.  Musculoskeletal: He exhibits no edema.  Lymphadenopathy:    He has no  cervical adenopathy.  Neurological: He is alert and oriented to person, place, and time.  Skin: No rash noted.  Psychiatric: He has a normal mood and affect.   Filed Vitals:   08/03/11 1332  BP: 149/74  Pulse: 71  Resp: 16    Body mass index is 33.44 kg/(m^2).  I have reviewed his arteriogram which shows a patent aortobifemoral bypass graft. He has a occluded left superficial femoral artery with reconstitution of the below knee popliteal artery and three-vessel runoff on the left.  Vein mapping in the office today shows that the left greater saphenous vein is marginal in size. In addition he has some varicosities associated with this pain.  I have independently interpreted his carotid duplex scan in the office today which shows that he has bilateral external carotid artery stenoses but less than 39% internal carotid artery stenoses.     Assessment:       This patient appears to have an infection under her left great toenail in his diabetes would like to remove the toenail. Given his infrainguinal arterial occlusive disease I think he would be at very high risk for nonhealing any type of procedure on the left great toe and would be at risk for limb loss. This reason I recommended revascularization the left leg before any work is on the toe. Plan:        His left femoropopliteal bypass graft is been scheduled for 08/11/2011.I have reviewed the indications for lower extremity bypass. I have also reviewed the potential complications of surgery including but not limited to: wound healing problems, infection, graft thrombosis, limb loss, or other unpredictable medical problems. All the patient's questions were answered and they are agreeable to proceed. I have explained that we name need to use a prosthetic graft at the saphenous vein is not usable.

## 2011-08-09 ENCOUNTER — Ambulatory Visit (HOSPITAL_COMMUNITY)
Admission: RE | Admit: 2011-08-09 | Discharge: 2011-08-09 | Disposition: A | Discharge status: Home / Self Care | Payer: Medicare Other | Source: Ambulatory Visit | Attending: Vascular Surgery | Admitting: Vascular Surgery

## 2011-08-09 ENCOUNTER — Encounter (HOSPITAL_COMMUNITY)
Admission: RE | Admit: 2011-08-09 | Discharge: 2011-08-09 | Disposition: A | Discharge status: Home / Self Care | Payer: Medicare Other | Source: Ambulatory Visit | Attending: Vascular Surgery | Admitting: Vascular Surgery

## 2011-08-09 ENCOUNTER — Other Ambulatory Visit: Payer: Self-pay | Admitting: Vascular Surgery

## 2011-08-09 DIAGNOSIS — I739 Peripheral vascular disease, unspecified: Secondary | ICD-10-CM | POA: Insufficient documentation

## 2011-08-09 DIAGNOSIS — Z01812 Encounter for preprocedural laboratory examination: Secondary | ICD-10-CM | POA: Insufficient documentation

## 2011-08-09 DIAGNOSIS — Z01818 Encounter for other preprocedural examination: Secondary | ICD-10-CM | POA: Insufficient documentation

## 2011-08-09 LAB — CBC
HCT: 44 % (ref 39.0–52.0)
Hemoglobin: 14.9 g/dL (ref 13.0–17.0)
MCH: 27.7 pg (ref 26.0–34.0)
MCHC: 33.9 g/dL (ref 30.0–36.0)
MCV: 81.9 fL (ref 78.0–100.0)
Platelets: 189 10*3/uL (ref 150–400)
RBC: 5.37 MIL/uL (ref 4.22–5.81)
RDW: 14.5 % (ref 11.5–15.5)
WBC: 9.7 10*3/uL (ref 4.0–10.5)

## 2011-08-09 LAB — URINALYSIS, ROUTINE W REFLEX MICROSCOPIC
Bilirubin Urine: NEGATIVE
Glucose, UA: NEGATIVE mg/dL
Hgb urine dipstick: NEGATIVE
Ketones, ur: NEGATIVE mg/dL
Leukocytes, UA: NEGATIVE
Nitrite: NEGATIVE
Protein, ur: NEGATIVE mg/dL
Specific Gravity, Urine: 1.008 (ref 1.005–1.030)
Urobilinogen, UA: 0.2 mg/dL (ref 0.0–1.0)
pH: 5 (ref 5.0–8.0)

## 2011-08-09 LAB — COMPREHENSIVE METABOLIC PANEL
ALT: 24 U/L (ref 0–53)
AST: 17 U/L (ref 0–37)
Albumin: 4 g/dL (ref 3.5–5.2)
Alkaline Phosphatase: 96 U/L (ref 39–117)
BUN: 21 mg/dL (ref 6–23)
CO2: 22 mEq/L (ref 19–32)
Calcium: 10 mg/dL (ref 8.4–10.5)
Chloride: 105 mEq/L (ref 96–112)
Creatinine, Ser: 1.45 mg/dL — ABNORMAL HIGH (ref 0.50–1.35)
GFR calc Af Amer: 59 mL/min — ABNORMAL LOW (ref >60)
GFR calc non Af Amer: 49 mL/min — ABNORMAL LOW (ref >60)
Glucose, Bld: 216 mg/dL — ABNORMAL HIGH (ref 70–99)
Potassium: 5.5 mEq/L — ABNORMAL HIGH (ref 3.5–5.1)
Sodium: 137 mEq/L (ref 135–145)
Total Bilirubin: 0.4 mg/dL (ref 0.3–1.2)
Total Protein: 6.9 g/dL (ref 6.0–8.3)

## 2011-08-09 LAB — SURGICAL PCR SCREEN
MRSA, PCR: NEGATIVE
Staphylococcus aureus: NEGATIVE

## 2011-08-09 LAB — PROTIME-INR
INR: 1.07 (ref 0.00–1.49)
Prothrombin Time: 14.1 seconds (ref 11.6–15.2)

## 2011-08-09 LAB — APTT: aPTT: 27 seconds (ref 24–37)

## 2011-08-09 NOTE — Op Note (Signed)
NAME:  Sean Saunders, Sean Saunders NO.:  [id number]  MEDICAL RECORD NO.:  [id number]  LOCATION:  [facility]                         FACILITY:  [facility]  PHYSICIAN:  Fransisco Hertz, MD       DATE OF BIRTH:  14-Sep-1946  DATE OF PROCEDURE:  08/01/2011 DATE OF DISCHARGE:  08/01/2011                              OPERATIVE REPORT   PROCEDURES: 1. Right common femoral artery cannulation under ultrasound guidance. 2. Aortogram. 3. Bilateral leg runoff.  PREOPERATIVE DIAGNOSIS:  Left foot ischemia.  POSTOPERATIVE DIAGNOSIS:  Left foot ischemia.  SURGEON:  Fransisco Hertz, MD  ESTIMATED BLOOD LOSS:  Minimal.  ANESTHESIA:  Conscious sedation.  CONTRAST:  100 mL.  SPECIMENS:  None.  FINDINGS: 1. Patent proximal aorta. 2. Patent aortobifemoral bypass graft. 3. Patent celiac artery. 4. Patent bilateral renal arteries. 5. Patent bilateral common femoral arteries and profunda arteries,     also a patent right superficial femoral artery. 6. Left SFA occluded at mid thigh. 7. There is reconstitution of the left popliteal artery at roughly the     level of the knee. 8. There is patent bilateral popliteal arteries and patent     trifurcations. 9. Intact right three-vessel runoff with a dominant posterior tibial. 10.Intact left three-vessel runoff with dominant posterior tibial.  INDICATIONS:  This is a 65 year old gentleman with nonhealing toe ulcer on the left side, also with findings concerning for possible ischemia in the left foot.  Based on his likely calcified artery as a long term diabetic, I felt that noninvasive studies in this gentleman would likely be of limited used due to noncompressibility.  Subsequently, I recommended he proceed forward with aortogram.  He previously has had an aortobifemoral bypass graft, so if intervention was going to be needed, an antegrade approach was going to be necessary.  He is aware of the risk of this procedure included bleeding, infection,  possible embolization, possible dissection, and possible rupture of treated vessels, and possible need for additional procedures.  He was aware of these risks and agreed to proceed forward.  DESCRIPTION OF THE OPERATION:  After full informed written consent was obtained from the patient, he was brought back to the angio suite, and placed supine upon angio table.  He was then connected to monitoring equipment and then given conscious sedation, the amounts of which are documented in his chart.  At this point, he was prepped and draped in standard fashion for aortogram, bilateral leg runoff.  I turned my attention first to his right groin under ultrasound guidance.  I cannulated the common femoral artery just distal to the aortobifemoral graft.  I was able to pass the wire up into the aorta at the level of L1.  The needle was exchanged out for a 5-French sheath, however, the sheath would not track through the tissue due to the scarring, so subsequently I obtained a 4-French end-hole catheter.  It was loaded over the wire and advanced into the aorta.  The wire was exchanged out for an Amplatz wire.  Then over the Amplatz wire, I loaded the 5-French sheath into the common femoral artery.  The dilator was then removed and then an Omniflush  catheter was loaded over the wire and advanced to the level about L1. The catheter was then connected to the power injector circuit after performing a declotting, de-airing maneuver.  A power injector aortogram was performed, the findings of which are listed above.  I then pulled down the catheter to just proximal to the bifurcation of the aortobifemoral graft, and then did an automated bilateral leg runoff, findings of which are listed above.  Based on the findings, I do not feel that an endovascular procedure will be successful in this patient due to possible need to stent the above-the-knee popliteal segment.  The reconstitution of the popliteal artery  unfortunately would place a potential stent into the joint space and subsequently would be at risk for fracture. Subsequently, I think this patient's best option is a left common femoral artery to below-the-knee popliteal bypass.  Subsequently, at this point, I terminated the case, placed the wire back into the Omniflush catheter and reconstituted the crook at this catheter and removed the wire and the catheter.  Then, I aspirated the sheath.  There was no clot and instilled heparinized saline.  The plan is to pull the sheath in the holding area.  COMPLICATIONS:  None.  CONDITION:  Stable.     Fransisco Hertz, MD     BLC/MEDQ  D:  08/01/2011  T:  08/02/2011  Job:  [id number]  Electronically Signed by Leonides Sake MD on 08/09/2011 07:57:11 PM

## 2011-08-11 ENCOUNTER — Inpatient Hospital Stay (HOSPITAL_COMMUNITY)
Admission: RE | Admit: 2011-08-11 | Discharge: 2011-08-17 | DRG: 253 | Disposition: A | Discharge status: Home Health | Payer: Medicare Other | Source: Ambulatory Visit | Attending: Vascular Surgery | Admitting: Vascular Surgery

## 2011-08-11 ENCOUNTER — Ambulatory Visit (HOSPITAL_COMMUNITY): Payer: Medicare Other

## 2011-08-11 DIAGNOSIS — Z96649 Presence of unspecified artificial hip joint: Secondary | ICD-10-CM

## 2011-08-11 DIAGNOSIS — E039 Hypothyroidism, unspecified: Secondary | ICD-10-CM | POA: Diagnosis present

## 2011-08-11 DIAGNOSIS — F909 Attention-deficit hyperactivity disorder, unspecified type: Secondary | ICD-10-CM | POA: Diagnosis present

## 2011-08-11 DIAGNOSIS — I6529 Occlusion and stenosis of unspecified carotid artery: Secondary | ICD-10-CM | POA: Diagnosis present

## 2011-08-11 DIAGNOSIS — J4489 Other specified chronic obstructive pulmonary disease: Secondary | ICD-10-CM | POA: Diagnosis present

## 2011-08-11 DIAGNOSIS — I7092 Chronic total occlusion of artery of the extremities: Secondary | ICD-10-CM | POA: Diagnosis present

## 2011-08-11 DIAGNOSIS — I70209 Unspecified atherosclerosis of native arteries of extremities, unspecified extremity: Principal | ICD-10-CM | POA: Diagnosis present

## 2011-08-11 DIAGNOSIS — E7800 Pure hypercholesterolemia, unspecified: Secondary | ICD-10-CM | POA: Diagnosis present

## 2011-08-11 DIAGNOSIS — D333 Benign neoplasm of cranial nerves: Secondary | ICD-10-CM | POA: Diagnosis present

## 2011-08-11 DIAGNOSIS — J449 Chronic obstructive pulmonary disease, unspecified: Secondary | ICD-10-CM | POA: Diagnosis present

## 2011-08-11 DIAGNOSIS — I10 Essential (primary) hypertension: Secondary | ICD-10-CM | POA: Diagnosis present

## 2011-08-11 DIAGNOSIS — E875 Hyperkalemia: Secondary | ICD-10-CM | POA: Diagnosis present

## 2011-08-11 DIAGNOSIS — D62 Acute posthemorrhagic anemia: Secondary | ICD-10-CM | POA: Diagnosis not present

## 2011-08-11 DIAGNOSIS — E1169 Type 2 diabetes mellitus with other specified complication: Secondary | ICD-10-CM | POA: Diagnosis present

## 2011-08-11 DIAGNOSIS — I70409 Unspecified atherosclerosis of autologous vein bypass graft(s) of the extremities, unspecified extremity: Secondary | ICD-10-CM | POA: Diagnosis not present

## 2011-08-11 DIAGNOSIS — I743 Embolism and thrombosis of arteries of the lower extremities: Secondary | ICD-10-CM

## 2011-08-11 DIAGNOSIS — G459 Transient cerebral ischemic attack, unspecified: Secondary | ICD-10-CM | POA: Diagnosis present

## 2011-08-11 LAB — POCT I-STAT 4, (NA,K, GLUC, HGB,HCT)
Glucose, Bld: 238 mg/dL — ABNORMAL HIGH (ref 70–99)
Glucose, Bld: 239 mg/dL — ABNORMAL HIGH (ref 70–99)
HCT: 36 % — ABNORMAL LOW (ref 39.0–52.0)
HCT: 38 % — ABNORMAL LOW (ref 39.0–52.0)
Hemoglobin: 12.2 g/dL — ABNORMAL LOW (ref 13.0–17.0)
Hemoglobin: 12.9 g/dL — ABNORMAL LOW (ref 13.0–17.0)
Potassium: 6.2 mEq/L — ABNORMAL HIGH (ref 3.5–5.1)
Potassium: 6.5 mEq/L (ref 3.5–5.1)
Sodium: 134 mEq/L — ABNORMAL LOW (ref 135–145)
Sodium: 134 mEq/L — ABNORMAL LOW (ref 135–145)

## 2011-08-11 LAB — BASIC METABOLIC PANEL
BUN: 17 mg/dL (ref 6–23)
BUN: 17 mg/dL (ref 6–23)
CO2: 23 mEq/L (ref 19–32)
CO2: 25 mEq/L (ref 19–32)
Calcium: 7.7 mg/dL — ABNORMAL LOW (ref 8.4–10.5)
Calcium: 7.6 mg/dL — ABNORMAL LOW (ref 8.4–10.5)
Chloride: 107 mEq/L (ref 96–112)
Chloride: 105 mEq/L (ref 96–112)
Creatinine, Ser: 1.29 mg/dL (ref 0.50–1.35)
Creatinine, Ser: 1.44 mg/dL — ABNORMAL HIGH (ref 0.50–1.35)
GFR calc Af Amer: >60 mL/min (ref >60)
GFR calc Af Amer: 59 mL/min — ABNORMAL LOW (ref >60)
GFR calc non Af Amer: 56 mL/min — ABNORMAL LOW (ref >60)
GFR calc non Af Amer: 49 mL/min — ABNORMAL LOW (ref >60)
Glucose, Bld: 284 mg/dL — ABNORMAL HIGH (ref 70–99)
Glucose, Bld: 323 mg/dL — ABNORMAL HIGH (ref 70–99)
Potassium: 6.6 mEq/L (ref 3.5–5.1)
Potassium: 6.3 mEq/L (ref 3.5–5.1)
Sodium: 134 mEq/L — ABNORMAL LOW (ref 135–145)
Sodium: 134 mEq/L — ABNORMAL LOW (ref 135–145)

## 2011-08-11 LAB — GLUCOSE, CAPILLARY
Glucose-Capillary: 225 mg/dL — ABNORMAL HIGH (ref 70–99)
Glucose-Capillary: 249 mg/dL — ABNORMAL HIGH (ref 70–99)

## 2011-08-11 LAB — ABO/RH: ABO/RH(D): AB POS

## 2011-08-11 NOTE — Procedures (Unsigned)
CAROTID DUPLEX EXAM  INDICATION:  Followup carotid disease.  HISTORY: Diabetes:  Yes. Cardiac: Hypertension:  Yes. Smoking:  Previous. Previous Surgery:  No. CV History: Amaurosis Fugax No, Paresthesias No, Hemiparesis No                                      RIGHT             LEFT Brachial systolic pressure: Brachial Doppler waveforms: Vertebral direction of flow:        Antegrade         Antegrade DUPLEX VELOCITIES (cm/sec) CCA peak systolic                   80                128 ECA peak systolic                   380               308 ICA peak systolic                   93                142 ICA end diastolic                   26                33 PLAQUE MORPHOLOGY:                  Heterogenous      Heterogenous PLAQUE AMOUNT:                      Mild              Mild PLAQUE LOCATION:                    ICA/ECA           CCA/ECA/ICA  IMPRESSION: 1. 1-39% bilateral ICA stenosis, right worse than left. 2. Diffuse (mild) plaque in the left CCA. 3. Significant stenosis of the bilateral ECA. 4. Bilateral vertebral arteries are within normal limits.  ___________________________________________ Di Kindle. Edilia Bo, M.D.  LT/MEDQ  D:  08/04/2011  T:  08/04/2011  Job:  [id number]

## 2011-08-11 NOTE — Procedures (Unsigned)
VASCULAR LAB EXAM  INDICATION:  Preop vein mapping for planned lower extremity bypass graft.  HISTORY: Diabetes:  Yes. Cardiac: Hypertension:  Yes.  EXAM:  Left great saphenous vein was mapped and found patent from the junction to the knee with multiple branches.  At the knee, the vein becomes tortuous with varicosities observed in the calf.  Calibers in the thigh range from 0.36 cm to 0.31 cm.  IMPRESSION:  Patent left great saphenous vein with calibers and branches as described on attached worksheet.  ___________________________________________ Di Kindle. Edilia Bo, M.D.  LT/MEDQ  D:  08/04/2011  T:  08/04/2011  Job:  [id number]

## 2011-08-12 ENCOUNTER — Inpatient Hospital Stay (HOSPITAL_COMMUNITY): Payer: Medicare Other

## 2011-08-12 DIAGNOSIS — T82898A Other specified complication of vascular prosthetic devices, implants and grafts, initial encounter: Secondary | ICD-10-CM

## 2011-08-12 DIAGNOSIS — I743 Embolism and thrombosis of arteries of the lower extremities: Secondary | ICD-10-CM

## 2011-08-12 DIAGNOSIS — Z48812 Encounter for surgical aftercare following surgery on the circulatory system: Secondary | ICD-10-CM

## 2011-08-12 DIAGNOSIS — M79609 Pain in unspecified limb: Secondary | ICD-10-CM

## 2011-08-12 LAB — BASIC METABOLIC PANEL
BUN: 19 mg/dL (ref 6–23)
BUN: 20 mg/dL (ref 6–23)
CO2: 25 mEq/L (ref 19–32)
CO2: 24 mEq/L (ref 19–32)
Calcium: 7.4 mg/dL — ABNORMAL LOW (ref 8.4–10.5)
Calcium: 7.2 mg/dL — ABNORMAL LOW (ref 8.4–10.5)
Chloride: 105 mEq/L (ref 96–112)
Chloride: 101 mEq/L (ref 96–112)
Creatinine, Ser: 1.48 mg/dL — ABNORMAL HIGH (ref 0.50–1.35)
Creatinine, Ser: 1.44 mg/dL — ABNORMAL HIGH (ref 0.50–1.35)
GFR calc Af Amer: 58 mL/min — ABNORMAL LOW (ref >60)
GFR calc Af Amer: 59 mL/min — ABNORMAL LOW (ref >60)
GFR calc non Af Amer: 48 mL/min — ABNORMAL LOW (ref >60)
GFR calc non Af Amer: 49 mL/min — ABNORMAL LOW (ref >60)
Glucose, Bld: 267 mg/dL — ABNORMAL HIGH (ref 70–99)
Glucose, Bld: 225 mg/dL — ABNORMAL HIGH (ref 70–99)
Potassium: 5.1 mEq/L (ref 3.5–5.1)
Potassium: 4.4 mEq/L (ref 3.5–5.1)
Sodium: 134 mEq/L — ABNORMAL LOW (ref 135–145)
Sodium: 131 mEq/L — ABNORMAL LOW (ref 135–145)

## 2011-08-12 LAB — GLUCOSE, CAPILLARY
Glucose-Capillary: 349 mg/dL — ABNORMAL HIGH (ref 70–99)
Glucose-Capillary: 238 mg/dL — ABNORMAL HIGH (ref 70–99)
Glucose-Capillary: 231 mg/dL — ABNORMAL HIGH (ref 70–99)
Glucose-Capillary: 243 mg/dL — ABNORMAL HIGH (ref 70–99)
Glucose-Capillary: 188 mg/dL — ABNORMAL HIGH (ref 70–99)
Glucose-Capillary: 141 mg/dL — ABNORMAL HIGH (ref 70–99)
Glucose-Capillary: 118 mg/dL — ABNORMAL HIGH (ref 70–99)

## 2011-08-12 LAB — CBC
HCT: 24.2 % — ABNORMAL LOW (ref 39.0–52.0)
HCT: 27.9 % — ABNORMAL LOW (ref 39.0–52.0)
Hemoglobin: 7.9 g/dL — ABNORMAL LOW (ref 13.0–17.0)
Hemoglobin: 9.1 g/dL — ABNORMAL LOW (ref 13.0–17.0)
MCH: 27.1 pg (ref 26.0–34.0)
MCH: 27.8 pg (ref 26.0–34.0)
MCHC: 32.6 g/dL (ref 30.0–36.0)
MCHC: 32.6 g/dL (ref 30.0–36.0)
MCV: 83.2 fL (ref 78.0–100.0)
MCV: 85.3 fL (ref 78.0–100.0)
Platelets: 163 10*3/uL (ref 150–400)
Platelets: 122 10*3/uL — ABNORMAL LOW (ref 150–400)
RBC: 2.91 MIL/uL — ABNORMAL LOW (ref 4.22–5.81)
RBC: 3.27 MIL/uL — ABNORMAL LOW (ref 4.22–5.81)
RDW: 14.8 % (ref 11.5–15.5)
RDW: 14.8 % (ref 11.5–15.5)
WBC: 7.6 10*3/uL (ref 4.0–10.5)
WBC: 11 10*3/uL — ABNORMAL HIGH (ref 4.0–10.5)

## 2011-08-12 LAB — POCT I-STAT 4, (NA,K, GLUC, HGB,HCT)
Glucose, Bld: 234 mg/dL — ABNORMAL HIGH (ref 70–99)
HCT: 21 % — ABNORMAL LOW (ref 39.0–52.0)
Hemoglobin: 7.1 g/dL — ABNORMAL LOW (ref 13.0–17.0)
Potassium: 4.7 mEq/L (ref 3.5–5.1)
Sodium: 134 mEq/L — ABNORMAL LOW (ref 135–145)

## 2011-08-12 LAB — PREPARE RBC (CROSSMATCH)

## 2011-08-13 LAB — GLUCOSE, CAPILLARY
Glucose-Capillary: 112 mg/dL — ABNORMAL HIGH (ref 70–99)
Glucose-Capillary: 220 mg/dL — ABNORMAL HIGH (ref 70–99)
Glucose-Capillary: 240 mg/dL — ABNORMAL HIGH (ref 70–99)

## 2011-08-13 LAB — CBC
HCT: 26.6 % — ABNORMAL LOW (ref 39.0–52.0)
Hemoglobin: 9.2 g/dL — ABNORMAL LOW (ref 13.0–17.0)
MCH: 28.8 pg (ref 26.0–34.0)
MCHC: 34.6 g/dL (ref 30.0–36.0)
MCV: 83.4 fL (ref 78.0–100.0)
Platelets: 135 10*3/uL — ABNORMAL LOW (ref 150–400)
RBC: 3.19 MIL/uL — ABNORMAL LOW (ref 4.22–5.81)
RDW: 14.8 % (ref 11.5–15.5)
WBC: 8.8 10*3/uL (ref 4.0–10.5)

## 2011-08-13 LAB — HEMOGLOBIN A1C
Hgb A1c MFr Bld: 7.8 % — ABNORMAL HIGH (ref <5.7)
Mean Plasma Glucose: 177 mg/dL — ABNORMAL HIGH (ref <117)

## 2011-08-13 LAB — BASIC METABOLIC PANEL
BUN: 13 mg/dL (ref 6–23)
CO2: 26 mEq/L (ref 19–32)
Calcium: 7.4 mg/dL — ABNORMAL LOW (ref 8.4–10.5)
Chloride: 104 mEq/L (ref 96–112)
Creatinine, Ser: 1.07 mg/dL (ref 0.50–1.35)
GFR calc Af Amer: >60 mL/min (ref >60)
GFR calc non Af Amer: >60 mL/min (ref >60)
Glucose, Bld: 110 mg/dL — ABNORMAL HIGH (ref 70–99)
Potassium: 3.8 mEq/L (ref 3.5–5.1)
Sodium: 135 mEq/L (ref 135–145)

## 2011-08-14 LAB — TYPE AND SCREEN
ABO/RH(D): AB POS
Antibody Screen: NEGATIVE
Unit division: 0
Unit division: 0
Unit division: 0
Unit division: 0

## 2011-08-14 LAB — GLUCOSE, CAPILLARY
Glucose-Capillary: 233 mg/dL — ABNORMAL HIGH (ref 70–99)
Glucose-Capillary: 227 mg/dL — ABNORMAL HIGH (ref 70–99)
Glucose-Capillary: 160 mg/dL — ABNORMAL HIGH (ref 70–99)
Glucose-Capillary: 115 mg/dL — ABNORMAL HIGH (ref 70–99)
Glucose-Capillary: 70 mg/dL (ref 70–99)

## 2011-08-15 LAB — CBC
HCT: 29.1 % — ABNORMAL LOW (ref 39.0–52.0)
Hemoglobin: 9.6 g/dL — ABNORMAL LOW (ref 13.0–17.0)
MCH: 28.1 pg (ref 26.0–34.0)
MCHC: 33 g/dL (ref 30.0–36.0)
MCV: 85.1 fL (ref 78.0–100.0)
Platelets: 187 10*3/uL (ref 150–400)
RBC: 3.42 MIL/uL — ABNORMAL LOW (ref 4.22–5.81)
RDW: 14.6 % (ref 11.5–15.5)
WBC: 12 10*3/uL — ABNORMAL HIGH (ref 4.0–10.5)

## 2011-08-15 LAB — GLUCOSE, CAPILLARY
Glucose-Capillary: 53 mg/dL — ABNORMAL LOW (ref 70–99)
Glucose-Capillary: 58 mg/dL — ABNORMAL LOW (ref 70–99)
Glucose-Capillary: 81 mg/dL (ref 70–99)
Glucose-Capillary: 191 mg/dL — ABNORMAL HIGH (ref 70–99)
Glucose-Capillary: 100 mg/dL — ABNORMAL HIGH (ref 70–99)
Glucose-Capillary: 48 mg/dL — ABNORMAL LOW (ref 70–99)
Glucose-Capillary: 78 mg/dL (ref 70–99)

## 2011-08-16 LAB — GLUCOSE, CAPILLARY
Glucose-Capillary: 81 mg/dL (ref 70–99)
Glucose-Capillary: 138 mg/dL — ABNORMAL HIGH (ref 70–99)
Glucose-Capillary: 86 mg/dL (ref 70–99)
Glucose-Capillary: 183 mg/dL — ABNORMAL HIGH (ref 70–99)
Glucose-Capillary: 177 mg/dL — ABNORMAL HIGH (ref 70–99)
Glucose-Capillary: 163 mg/dL — ABNORMAL HIGH (ref 70–99)

## 2011-08-16 NOTE — Op Note (Signed)
NAME:  Sean Saunders, Sean Saunders               ACCOUNT NO.:  [id number]  MEDICAL RECORD NO.:  [id number]  LOCATION:                                 FACILITY:  PHYSICIAN:  Di Kindle. Edilia Bo, M.D.DATE OF BIRTH:  1946-07-12  DATE OF PROCEDURE:  08/11/2011 DATE OF DISCHARGE:                              OPERATIVE REPORT   PREOPERATIVE DIAGNOSIS:  Nonhealing wound of the left great toe with superficial femoral artery occlusive disease.  POSTOPERATIVE DIAGNOSIS:  Nonhealing wound of the left great toe with superficial femoral artery occlusive disease.  PROCEDURES: 1. Left femoral-to-below-knee popliteal artery bypass graft with a non-     reverse translocated saphenous vein graft. 2. Intraoperative arteriogram.  SURGEON:  Di Kindle. Edilia Bo, MD  ASSISTANT:  Pecola Leisure, PA.  ANESTHESIA:  General.  INDICATIONS:  This is a 65 year old gentleman who had undergone previous aortobifemoral bypass graft in 2006.  He presented with a nonhealing wound of his left great toe and underwent an arteriogram which showed a superficial femoral artery occlusion and reconstitution of the below- knee popliteal artery.  It was felt that his best chance at limb salvage was attempted revascularization.  Vein mapping showed a reasonable, but somewhat small saphenous vein on the right preoperatively.  TECHNIQUE:  The patient was taken to the operating room and received a general anesthetic.  The left lower extremity was prepped and draped in the usual sterile fashion.  The previous longitudinal incision in the left groin was opened and through this incision, the left limb of the aortobifemoral bypass graft was dissected free.  Of note, the anastomosis was fairly high.  The left limb of the graft was controlled with a loop and the common femoral artery proximal and distal to the anastomosis was controlled.  Next, the saphenofemoral junction was dissected free and then using 4 additional incisions  along the medial aspect of the left leg, the saphenous vein was harvested down to the midportion of the calf.  Branches were divided between clips and 3-0 silk ties.  The vein became somewhat smaller in the distal aspect and I traced the deeper branch which was reasonable size also.  Through the distal incision, the below-knee popliteal artery was exposed and it was soft and patent.  Tunnel was created from the below-knee popliteal incision to the groin incision and the patient was then heparinized. Saphenofemoral junction was then clamped and then the vein excised and then the femoral vein oversewn with a 5-0 Prolene suture.  The proximal aspect of the vein was spatulated.  The left limb of the aortofemoral graft was clamped, the common femoral artery clamped proximally and distally and a longitudinal graftotomy made over the hood of the distal left limb of the aortofemoral bypass graft.  The vein was sewn in a non- reverse fashion end-to-side to the graft using continuous 5-0 Prolene suture.  Prior to completing this anastomosis, the artery was backbled and flushed and the anastomosis completed.  There was a good pulse in the proximal graft.  Next, a retrograde [place] valvulotome was used to lyse the valves and excellent flow was established to the graft. Multiple small branches were ligated with 3-0 silk ties.  The  graft was then marked with a twisting.  It was then brought through the previously created tunnel.  A tourniquet was placed on the upper thigh.  The leg was exsanguinated with an Esmarch bandage and tourniquet inflated to 300 mmHg.  Under tourniquet control, a longitudinal arteriotomy was made at the below-knee popliteal artery.  The vein was cut to the appropriate length, spatulated, and sewn end-to-side to the vein using 2 continuous 6-0 Prolene sutures, parachuting both the heel and toe of the anastomosis.  Prior to completing the anastomosis, the tourniquet was released,  the artery was backbled and flushed appropriately, and the anastomosis completed.  Flow was reestablished to the left foot.  There was a good posterior tibial and anterior tibial signal at the completion.  Intraoperative arteriogram was obtained which showed no technical problems.  There was two-vessel runoff via the posterior tibial and peroneal arteries.  Next, the wounds were irrigated and a 15 Blake drain was placed through the distal incision and tunneled up to the vein harvest incisions.  The vein harvest incisions were closed with 2 deep layers of 3-0 Vicryl and the skin closed with a 4-0 subcuticular stitch.  The groin incision was closed with a deep layer of 2-0 Vicryl, subcutaneous layer of 3-0 Vicryl, and the skin was closed with a 4-0 subcuticular stitch.  The distal incision was closed with 2 deep layers of 2-0 Vicryl and the skin closed with a 4-0 subcuticular stitch.  The patient tolerated the procedure well and was transferred to the recovery room in stable condition.  All needle and sponge counts were correct.     Di Kindle. Edilia Bo, M.D.     CSD/MEDQ  D:  08/11/2011  T:  08/11/2011  Job:  [id number]  Electronically Signed by Waverly Ferrari M.D. on 08/16/2011 12:07:16 PM

## 2011-08-17 LAB — CBC
HCT: 26.1 % — ABNORMAL LOW (ref 39.0–52.0)
Hemoglobin: 8.8 g/dL — ABNORMAL LOW (ref 13.0–17.0)
MCH: 28.8 pg (ref 26.0–34.0)
MCHC: 33.7 g/dL (ref 30.0–36.0)
MCV: 85.3 fL (ref 78.0–100.0)
Platelets: 245 10*3/uL (ref 150–400)
RBC: 3.06 MIL/uL — ABNORMAL LOW (ref 4.22–5.81)
RDW: 14.9 % (ref 11.5–15.5)
WBC: 9.1 10*3/uL (ref 4.0–10.5)

## 2011-08-23 ENCOUNTER — Encounter: Payer: Self-pay | Admitting: Vascular Surgery

## 2011-08-24 ENCOUNTER — Ambulatory Visit (INDEPENDENT_AMBULATORY_CARE_PROVIDER_SITE_OTHER): Payer: Medicare Other | Admitting: Vascular Surgery

## 2011-08-24 ENCOUNTER — Encounter: Payer: Self-pay | Admitting: Vascular Surgery

## 2011-08-24 VITALS — BP 111/56 | HR 63 | Resp 16 | Ht 72.0 in | Wt 248.0 lb

## 2011-08-24 DIAGNOSIS — I70219 Atherosclerosis of native arteries of extremities with intermittent claudication, unspecified extremity: Secondary | ICD-10-CM

## 2011-08-24 NOTE — Progress Notes (Signed)
CC: Followup after a left femoropopliteal bypass  HPI: Sean Saunders is a 65 y.o. male who presented with a nonhealing wound of his left great toe. He has an aortobifemoral bypass graft which was done in 2006. He underwent a left femoral to below knee pop bypass with a vein graft on 08/11/2011. I noted that the vein was fairly small. This occluded on postoperative day #1 he was taken back to the operating room and underwent thrombectomy and evacuation of hematoma. It appeared that a tie had come off of his vein graft and that possibly a hematoma had compressed occluded his graft. Graft remained patent after that. He does complain of some muscle pain but not any significant incisional pain. Activities been fairly limited and he is having physical therapy and occupational therapy at home.  SOCIAL HISTORY: History  Substance Use Topics  . Smoking status: Current Some Day Smoker -- 0.2 packs/day for 5 years    Types: Cigarettes  . Smokeless tobacco: Not on file   Comment: quit in 2006, and now smokes 1-2 cigarettes/day  . Alcohol Use: Yes     rare    REVIEW OF SYSTEMS: CONSTITUTIONAL:  [ ]  fever   [ ]  chills CARDIOVASCULAR: [ ]  chest pain   [ ]  chest pressure   [ ]  palpitations   [ ]  orthopnea   [ ]  dyspnea on exertion   [ ]  claudication   [ ]  rest pain   [ ]  DVT   [ ]  phlebitis.  PHYSICAL EXAM: Filed Vitals:   08/24/11 1507  BP: 111/56  Pulse: 63  Resp: 16   Body mass index is 33.63 kg/(m^2).  GENERAL: The patient appears their stated age. The vital signs are documented above. CARDIOVASCULAR: There is a regular rate and rhythm without significant murmur appreciated.  PULMONARY: There is good air exchange bilaterally without wheezing or rales. NEUROLOGIC: No focal weakness or paresthesias are detected. SKIN: His incisions are healing adequately. He has moderate left lower extremity swelling.  MEDICAL ISSUES: This patient is status post left femoropopliteal bypass grafting.  Encouraged him to elevate his leg as much as possible to help with the swelling. Continue physical therapy and occupational therapy at home. Person to meticulously take care of the groin wound. See him back in 6 weeks. Once his swelling is better and leg he will need a baseline graft duplex.

## 2011-08-26 NOTE — Discharge Summary (Signed)
NAMEVICTORHUGO, PREIS NO.:  [id number]  MEDICAL RECORD NO.:  [id number]  LOCATION:  2002                         FACILITY:  [facility]  PHYSICIAN:  Di Kindle. Edilia Bo, M.D.DATE OF BIRTH:  [birth year]  DATE OF ADMISSION:  08/11/2011 DATE OF DISCHARGE:                              DISCHARGE SUMMARY   DIAGNOSIS:  Nonhealing wound of the left great toe with superficial femoral artery occlusive disease.  PAST MEDICAL HISTORY AND DISCHARGE DIAGNOSES: 1. Nonhealing wound of the left great toe with superficial femoral     artery occlusive disease status post left femoral-to-below-the-knee     popliteal artery bypass with vein. 2. Reocclusion of left femoral-popliteal bypass with subsequent redo     left femoral-popliteal bypass. 3. 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 Posterior circulation transient ischemic attack. 5. Poorly controlled diabetes mellitus. 6. Hypertension. 7. Hypercholesterolemia. 8. Chronic obstructive pulmonary disease. 9. Hypothyroidism. 10.Right hip replacement. 11.Left carotid stenosis 40%-59%.  BRIEF HISTORY:  The patient is a 65 year old male who had undergone previous aortobifemoral bypass graft in 2006.  He presented to Dr. Edilia Bo with a nonhealing wound to the left great toe and underwent an arteriogram which showed a superficial femoral artery occlusion and reconstitution of the below-the-knee popliteal artery.  It was felt that his best chance for limb salvage was attempted revascularization.  HOSPITAL COURSE:  The patient was admitted and taken to the OR on August 11, 2011 for a left femoral-to-below-the-knee popliteal artery bypass graft with nonreversed translocated greater saphenous vein.  The patient tolerated the procedure well and was hemodynamically stable immediately postoperatively.  He was transferred from the OR to the postanesthesia care unit in stable addition.  The  patient was extubated without complication and woke up from anesthesia neurologically intact.  On postoperative day 1, the patient was found to have an acute blood loss anemia with a hemoglobin of 7.9.  He was transfused with a unit of packed RBCs.  The patient's JP drain had minimal output overnight and was discontinued.  The patient did have a somewhat cold left foot with poor Doppler flow.  Secondary to this, Dr. Arbie Cookey ordered a noninvasive lab which revealed an ABI in the 0.5 level with no identifiable graft flow.  It was recommended at that time that the patient be taken back to the operating room for thrombectomy and possible revision.  The patient was taken back to the operating room on August 12, 2011 for exploration of the left groin with hematoma evacuation, thrombectomy of the left fem-pop bypass graft, and intraoperative arteriogram.  The patient again tolerated the procedure well and was hemodynamically stable immediately postoperatively.  He was transferred from the OR to the postanesthesia care unit and was extubated without complication.  He woke up neurologically intact.  The remainder of the patient's postoperative course progressed as expected.  He has remained afebrile with stable vital signs.  He has had some issue with mobilization.  He is working with Physical Therapy at this time.  His Foley was discontinued successfully and he has been able to void without difficulty.  He is tolerating regular diet.  The patient's diabetes mellitus  has continued an issue throughout the postoperative course.  He has had hypoglycemic events and, therefore, his regular insulin has been held.  He has been continued on sliding scale at this time.  The patient was noted to have a fungal toe infection on August 15, 2011 and was started on Lamisil.  This will continue as an outpatient as well.  Both PT and OT have recommended home health therapies.  The patient's graft has  remained patent at this time.  On postop day 5 from initial surgery and on postop day 4 from redo, he is comfortable.  He is afebrile with stable vital signs.  The incisions are clean, dry, and intact with no evidence of drainage.  There is moderate swelling in the left lower extremity.  There is brisk posterior tibial and anterior tibial signals with Doppler.  The patient has been continued on Plavix and Lovenox during the hospitalization and he will continue on Plavix only at home.  As long as the patient continues to progress in the current manner, he should be ready for discharge home tomorrow on August 17, 2011 with home health therapies.  LABORATORY DATA:  CBC on August 15, 2011; white count 12, hemoglobin 9.6, hematocrit 29.1, platelets 187.  BMP on August 13, 2011; sodium 135, potassium 3.8, BUN 13, creatinine 1.07.  DISCHARGE INSTRUCTIONS:  The patient received specific written discharge instructions regarding diet, activity, and wound care.  He is to follow up with Dr. Edilia Bo in approximately 2 weeks.  He will be contacted with the date and time of that appointment.  The patient should also contact his primary care physician regarding his diabetes mellitus and medication.  DISCHARGE MEDICATIONS: 1. Oxycodone 5 mg IR 1-2 q.4-6 h. p.r.n. pain, #30, no refills. 2. Lamisil 250 mg 1 p.o. daily, #30 with one refill provided. 3. Advair Diskus 250/50 one puff inhaled b.i.d. 4. Avapro 300 mg daily. 5. Bystolic 10 mg daily. 6. Clopidogrel 75 mg daily. 7. Furosemide 40 mg daily. 8. Glucophage XR 1000 mg b.i.d. 9. Humulin R 500 units 50-175 units subcu b.i.d. 10.Januvia 100 mg daily. 11.Meclizine 25 mg q.i.d. p.r.n. 12.Norvasc 5 mg daily. 13.Phenergan 25 mg q.8 p.r.n. 14.Prilosec 20 mg daily. 15.Respimat inhaler study drug 1 capsule inhaled daily. 16.Spiriva 18 mcg daily. 17.Synthroid 137 mcg daily. 18.Zetia 10 mg daily. 19.Zocor 20 mg at bedtime. 20.Zolpidem 5 mg at  bedtime.     Pecola Leisure, PA   ______________________________ Di Kindle. Edilia Bo, M.D.    AY/MEDQ  D:  08/16/2011  T:  08/16/2011  Job:  [id number]  Electronically Signed by Pecola Leisure PA on 08/23/2011 04:04:34 PM Electronically Signed by Waverly Ferrari M.D. on 08/26/2011 02:54:42 PM

## 2011-08-30 ENCOUNTER — Other Ambulatory Visit: Payer: Self-pay

## 2011-08-30 NOTE — Telephone Encounter (Signed)
Wife called on pts. behalf and requested a refill on Oxycodone.  Stated that pt. was using her supply of Oxycodone and she verbalized concern about him taking too much of it.  Wife explained that pt. was not moving very much, until recent Physical Therapy was started. Wife feels pt. Is doing better with mobility.  Pt. is s/p Left Fem-Pop BP of 08/11/11.   Strongly discouraged wife from giving pt. any of her pain medication.  Explained that pt. should  only take medication prescribed to him.  Also advised wife that pt. needs to only take the prescription-strength pain med when pain level is high, and that he should try to wean self to OTC pain medication after this RX.  Verbalized concern to wife about pt. becoming addicted to the pain medication. Informed wife that nurse will call-in Hydrocodone/acetaminophen in a limited quantity, and will need to call for office visit if pain persists.  Wife verb. understanding. Called-in to [facility] on [address]: Hydrocodone/Acetaminophen 5/500 mg, take 1-2 tabs q 6hr. prn/ pain; qty. 20; no refills.

## 2011-08-30 NOTE — Op Note (Signed)
NAMEMarland Kitchen  Sean Saunders, Sean Saunders NO.:  [id number]  MEDICAL RECORD NO.:  [id number]  LOCATION:  3314                         FACILITY:  [facility]  PHYSICIAN:  Larina Earthly, M.D.    DATE OF BIRTH:  1946/01/07  DATE OF PROCEDURE:  08/12/2011 DATE OF DISCHARGE:                              OPERATIVE REPORT   PREOPERATIVE DIAGNOSIS:  Reocclusion of left femoral-popliteal bypass.  POSTOPERATIVE DIAGNOSIS:  Reocclusion of left femoral-popliteal bypass.  PROCEDURE:  Redo left femoral-popliteal bypass.  SURGEON:  Larina Earthly, MD  ASSISTANT:  Newton Pigg, PA-C  ANESTHESIA:  General endotracheal.  COMPLICATIONS:  None.  DISPOSITION:  To recovery room stable.  INDICATIONS FOR PROCEDURE:  The patient is a 65 year old gentleman who is postop day #1 from left femoral-popliteal bypass with saphenous vein. Postop morning #1, the patient was found to have somewhat cool left foot with poor Doppler flow.  Noninvasive lab revealed an ankle-brachial index in the 0.5 level with no identifiable graft.  It is recommended that the patient to back to the operating room for thrombectomy and possible revision.  PROCEDURE IN DETAIL:  The patient was taken to the operating room and placed in supine position where the left groin and left leg were prepped and draped in the usual sterile fashion.  The groin incision was reopened and there was a large amount of hematoma present and there was also after evacuation of the hematoma active arterial bleeding.  This was controlled with digital pressure.  On exploration of the anastomosis, the anastomosis was intact, but there was a branch off the proximal portion of the saphenous vein graft that was bleeding.  This was controlled with a 2-0 silk tie.  Blood had tracked along the graft down through the thigh.  This was all evacuated.  There was a pulse in the proximal graft, but no pulse in the more distal graft in the groin and Doppler showed  that this had an occlusive sound.  The patient was given 8000 units of intravenous heparin and the graft was occluded near the anastomosis to the left limb of the aortofemoral graft.  The vein was opened transversely and a #4 Fogarty catheter was passed through the vein down to the level of the ankle.  Clot was removed and after several passes, no further clot was removed and good arterial backbleeding was noted.  With no further clot was removed, the graft was flushed with heparinized saline.  The incision in the vein graft was closed with interrupted 6-0 Prolene sutures.  Intraoperative arteriogram was obtained through a 21 gauge butterfly needle.  This showed good flow to the foot with some spasm in the posterior tibial artery.  There was some adherent thrombus at the hood of the distal anastomosis.  For this reason, the distal anastomosis was exposed through the same incision by opening up the old incision.  There was some old hematoma here as well. This was all evacuated.  The below-knee popliteal artery was occluded proximal and distal of the vein graft.  The vein graft was occluded and was opened longitudinally over the hood of the bypass.  The adherent thrombus was easily removed and no technical  problems were encountered. The incision in the vein graft was closed with a running 6-0 Prolene suture.  Clamps were removed and excellent Doppler flow was noted at the posterior tibial of the ankle.  The patient was given 100 mg of protamine to reverse the heparin.  Wounds were irrigated with saline. Hemostasis was obtained with electrocautery.  Wounds were closed with 2- 0 Vicryl on the subcutaneous tissue.  The skin was closed with 3-0 subcuticular Vicryl stitch.  Sterile dressing was applied.  The patient was taken to the recovery room in stable condition.     Larina Earthly, M.D.     TFE/MEDQ  D:  08/14/2011  T:  08/14/2011  Job:  [id number]  Electronically Signed by TODD EARLY  M.D. on 08/30/2011 01:11:41 PM

## 2011-09-02 LAB — BASIC METABOLIC PANEL
BUN: 17
BUN: 26 — ABNORMAL HIGH
BUN: 27 — ABNORMAL HIGH
BUN: 33 — ABNORMAL HIGH
CO2: 25
CO2: 24
CO2: 26
CO2: 31
Calcium: 9.3
Calcium: 8.5
Calcium: 8.7
Calcium: 8.7
Chloride: 106
Chloride: 101
Chloride: 102
Chloride: 103
Creatinine, Ser: 0.91
Creatinine, Ser: 1.08
Creatinine, Ser: 1.09
Creatinine, Ser: 1.16
GFR calc Af Amer: >60
GFR calc Af Amer: >60
GFR calc Af Amer: >60
GFR calc Af Amer: >60
GFR calc non Af Amer: >60
GFR calc non Af Amer: >60
GFR calc non Af Amer: >60
GFR calc non Af Amer: >60
Glucose, Bld: 289 — ABNORMAL HIGH
Glucose, Bld: 178 — ABNORMAL HIGH
Glucose, Bld: 241 — ABNORMAL HIGH
Glucose, Bld: 89
Potassium: 4.6
Potassium: 4.4
Potassium: 4.7
Potassium: 4.8
Sodium: 139
Sodium: 135
Sodium: 136
Sodium: 140

## 2011-09-02 LAB — CBC
HCT: 42.1
HCT: 42.5
HCT: 38.7 — ABNORMAL LOW
HCT: 40.8
HCT: 41.7
Hemoglobin: 14.2
Hemoglobin: 14.4
Hemoglobin: 12.9 — ABNORMAL LOW
Hemoglobin: 13.7
Hemoglobin: 14.1
MCHC: 33.7
MCHC: 33.8
MCHC: 33.4
MCHC: 33.5
MCHC: 33.8
MCV: 83.6
MCV: 84.1
MCV: 83.3
MCV: 84.1
MCV: 84.5
Platelets: 176
Platelets: 179
Platelets: 189
Platelets: 216
Platelets: 223
RBC: 5.01
RBC: 5.09
RBC: 4.61
RBC: 4.82
RBC: 5
RDW: 14.7
RDW: 15.1
RDW: 14.6
RDW: 14.8
RDW: 14.8
WBC: 15 — ABNORMAL HIGH
WBC: 15.4 — ABNORMAL HIGH
WBC: 11.1 — ABNORMAL HIGH
WBC: 11.3 — ABNORMAL HIGH
WBC: 17.7 — ABNORMAL HIGH

## 2011-09-02 LAB — COMPREHENSIVE METABOLIC PANEL
ALT: 30
AST: 24
Albumin: 3.9
Alkaline Phosphatase: 111
BUN: 16
CO2: 29
Calcium: 9.4
Chloride: 106
Creatinine, Ser: 1.12
GFR calc Af Amer: >60
GFR calc non Af Amer: >60
Glucose, Bld: 119 — ABNORMAL HIGH
Potassium: 4.9
Sodium: 142
Total Bilirubin: 0.5
Total Protein: 6.6

## 2011-09-02 LAB — BLOOD GAS, ARTERIAL
Acid-Base Excess: 1.3
Bicarbonate: 24.1 — ABNORMAL HIGH
FIO2: 0.21
O2 Saturation: 87.3
Patient temperature: 98.6
TCO2: 21.1
pCO2 arterial: 34.1 — ABNORMAL LOW
pH, Arterial: 7.464 — ABNORMAL HIGH
pO2, Arterial: 51.3 — ABNORMAL LOW

## 2011-09-02 LAB — DIFFERENTIAL
Basophils Absolute: 0.2 — ABNORMAL HIGH
Basophils Absolute: 0.3 — ABNORMAL HIGH
Basophils Relative: 1
Basophils Relative: 2 — ABNORMAL HIGH
Eosinophils Absolute: 0
Eosinophils Absolute: 0.1
Eosinophils Relative: 0
Eosinophils Relative: 0
Lymphocytes Relative: 13
Lymphocytes Relative: 9 — ABNORMAL LOW
Lymphs Abs: 1.3
Lymphs Abs: 2
Monocytes Absolute: 0.2
Monocytes Absolute: 1.4 — ABNORMAL HIGH
Monocytes Relative: 1 — ABNORMAL LOW
Monocytes Relative: 9
Neutro Abs: 11.7 — ABNORMAL HIGH
Neutro Abs: 13.3 — ABNORMAL HIGH
Neutrophils Relative %: 76
Neutrophils Relative %: 89 — ABNORMAL HIGH

## 2011-09-02 LAB — LIPID PANEL
Cholesterol: 112
HDL: 50
LDL Cholesterol: 51
Total CHOL/HDL Ratio: 2.2
Triglycerides: 57
VLDL: 11

## 2011-09-02 LAB — EXPECTORATED SPUTUM ASSESSMENT W GRAM STAIN, RFLX TO RESP C

## 2011-09-02 LAB — HEMOGLOBIN A1C
Hgb A1c MFr Bld: 9 — ABNORMAL HIGH
Hgb A1c MFr Bld: 9.6 — ABNORMAL HIGH
Mean Plasma Glucose: 243
Mean Plasma Glucose: 264

## 2011-09-02 LAB — B-NATRIURETIC PEPTIDE (CONVERTED LAB): Pro B Natriuretic peptide (BNP): <30

## 2011-09-02 LAB — POCT CARDIAC MARKERS
CKMB, poc: 3.5
Myoglobin, poc: 124
Operator id: 1415
Troponin i, poc: <0.05

## 2011-09-02 LAB — EXPECTORATED SPUTUM ASSESSMENT W REFEX TO RESP CULTURE

## 2011-09-02 LAB — GLUCOSE, RANDOM: Glucose, Bld: 392 — ABNORMAL HIGH

## 2011-09-02 LAB — CULTURE, RESPIRATORY W GRAM STAIN

## 2011-09-02 LAB — TSH: TSH: 1.069

## 2011-09-02 LAB — CULTURE, RESPIRATORY

## 2011-09-07 ENCOUNTER — Ambulatory Visit: Payer: Medicare Other | Admitting: Vascular Surgery

## 2011-09-07 ENCOUNTER — Other Ambulatory Visit: Payer: Medicare Other

## 2011-09-09 ENCOUNTER — Telehealth: Payer: Self-pay

## 2011-09-09 NOTE — Telephone Encounter (Signed)
Pt. called office w/ c/o area that is raised in left groin.  States is "size of an egg" and is "firm", without pain or discoloration. Denies bleeding;states has been there x 1 wk.   Discussed w/ Dr. Imogene Burn.  Advised to schedule office visit for eval. States no vascular lab prior to eval.  Advised pt. will call him w/ appt. for next wk.

## 2011-09-14 ENCOUNTER — Encounter: Payer: Self-pay | Admitting: Thoracic Diseases

## 2011-09-15 ENCOUNTER — Ambulatory Visit (INDEPENDENT_AMBULATORY_CARE_PROVIDER_SITE_OTHER): Payer: Medicare Other | Admitting: *Deleted

## 2011-09-15 ENCOUNTER — Encounter: Payer: Self-pay | Admitting: Thoracic Diseases

## 2011-09-15 ENCOUNTER — Ambulatory Visit (INDEPENDENT_AMBULATORY_CARE_PROVIDER_SITE_OTHER): Payer: Medicare Other | Admitting: Thoracic Diseases

## 2011-09-15 VITALS — BP 168/70 | HR 69 | Resp 24 | Ht 72.0 in | Wt 245.4 lb

## 2011-09-15 DIAGNOSIS — I70219 Atherosclerosis of native arteries of extremities with intermittent claudication, unspecified extremity: Secondary | ICD-10-CM

## 2011-09-15 DIAGNOSIS — R1900 Intra-abdominal and pelvic swelling, mass and lump, unspecified site: Secondary | ICD-10-CM

## 2011-09-15 DIAGNOSIS — IMO0001 Reserved for inherently not codable concepts without codable children: Secondary | ICD-10-CM

## 2011-09-15 DIAGNOSIS — R1909 Other intra-abdominal and pelvic swelling, mass and lump: Secondary | ICD-10-CM

## 2011-09-15 DIAGNOSIS — M7989 Other specified soft tissue disorders: Secondary | ICD-10-CM

## 2011-09-15 DIAGNOSIS — M79609 Pain in unspecified limb: Secondary | ICD-10-CM

## 2011-09-15 NOTE — Progress Notes (Signed)
[facility] [place]  Postoperative Visit Bypass Surgery Date of Surgery: Left Fem -Pop bypass 08/12/11: redo Left Fem Pop bypass 08/16/11 Surgeon:  History of Present Illness  Sean Saunders is a 65 y.o. male who presents for postoperative follow-up for: left Vascular Bypass surgery.  The patient's wounds are clean, dry, intact with mod hematoma at harvest wound left upper thigh as well as post thigh which is tender and has been keeping him up at night. The calf and the rest of the leg is swollen but doing ok. Pt denies fever chills or drainage from wounds  Patients pain is fairly well controlled.  The patient's pre-operative symptoms of non healing wound left great toe are Unchanged.  VASC. LAB Studies: 09/15/2011 Duplex scan of venous system showed no DVT but positive hematoma in the area of the incisions        Physical Examination  Filed Vitals:   09/15/11 1357  BP: 168/70  Pulse: 69  Resp: 24    Pt is A&O x 3 Gait is limp left lower extremity: Incision/s are healing well. The upper thigh harvest wound has small hematoma and stitch knot was removed. This was mildly erythematous at the stitch site only but not fluctuant.  There was no erythema around any of the wounds. The groin wound was healing and he had some areas of rash secondary to tape. He had a tender area on the posterior thigh that was firm and most likely  Hematoma.  This was confirmed by USG LLE has mild edema as expected  Dorsalis Pedis pulse is monophasic by Doppler Posterior tibial pulse is  monophasic by Doppler    Medical Decision Making  Sean Saunders is a 65 y.o. year old male who presents s/p left lower extremity bypass surgery .  The patient's bypass incisions are clean, dry, intact or healed with good resolution of pre-operative symptoms.  He has hematoma in the thigh area of harvest sites with stitch protrusion and mild erythema  We will give the pt. 7 days of keflex and a  prescription for Tramadol for pain  He will F/U in 2 weeks with Dr. Edilia Bo to check wounds The patient's surveillance will included ABI and bypass duplex studies which will be  in: 3 months.    Clinic MD: Darrick Penna

## 2011-09-23 DIAGNOSIS — I7092 Chronic total occlusion of artery of the extremities: Secondary | ICD-10-CM

## 2011-09-26 NOTE — Procedures (Unsigned)
DUPLEX DEEP VENOUS EXAM - LOWER EXTREMITY  INDICATION:  Left lower extremity postop pain.  HISTORY:  Edema:  Yes. Trauma/Surgery:  Yes. Pain:  Yes. PE:  No. Previous DVT:  No. Anticoagulants:  No. Other:  DUPLEX EXAM:               CFV   SFV   PopV  PTV     GSV               R  L  R  L  R  L  R   L   R  L Thrombosis    o  o     o     o      NV     o Spontaneous   +  +     +     +             + Phasic        +  +     +     +             + Augmentation  +  +     +     +             + Compressible  +  +     +     +             + Competent     +  +     +     +             +  Legend:  + - yes  o - no  p - partial  D - decreased  IMPRESSION:  No evidence of left lower extremity deep venous thrombosis, although the distal femoral vein and calf veins could not be adequately visualized due to edema. Left prox thigh hematoma measuring approximately  2cm - 4cm .  _____________________________ Fransisco Hertz, MD  EM/MEDQ  D:  09/15/2011  T:  09/15/2011  Job:  [id number]

## 2011-09-27 ENCOUNTER — Encounter: Payer: Self-pay | Admitting: Thoracic Diseases

## 2011-09-27 ENCOUNTER — Telehealth: Payer: Self-pay

## 2011-09-27 NOTE — Telephone Encounter (Signed)
Pt. Calling with continued c/o pain in left leg around knee.  States pain is a level "7", and worsens with standing.  Has been taking pain meds for discomfort and states with pain medication pain level decreases to level "5".  Pt. Is approx 6 wks. Postop, and states is very discouraged.  Will schedule appt. To be further evaluated.  States area at knee incison is "pink".  No dnge.

## 2011-09-28 ENCOUNTER — Encounter: Payer: Self-pay | Admitting: Thoracic Diseases

## 2011-09-28 ENCOUNTER — Ambulatory Visit (INDEPENDENT_AMBULATORY_CARE_PROVIDER_SITE_OTHER): Payer: Medicare Other | Admitting: Thoracic Diseases

## 2011-09-28 VITALS — BP 160/62 | HR 79 | Resp 20

## 2011-09-28 DIAGNOSIS — IMO0002 Reserved for concepts with insufficient information to code with codable children: Secondary | ICD-10-CM

## 2011-09-28 DIAGNOSIS — M629 Disorder of muscle, unspecified: Secondary | ICD-10-CM

## 2011-09-28 DIAGNOSIS — M79609 Pain in unspecified limb: Secondary | ICD-10-CM

## 2011-09-28 DIAGNOSIS — M792 Neuralgia and neuritis, unspecified: Secondary | ICD-10-CM

## 2011-09-28 NOTE — Progress Notes (Signed)
Date of Surgery: Left Fem -Pop bypass 08/12/11: redo Left Fem Pop bypass 08/16/11   History of Present Illness  Sean Saunders is a 65 y.o. male who presents for postoperative follow-up for: left fem -pop Bypass surgery. He was seen on 09/15/2011 where the patient's wounds were clean, dry, intact with mod hematoma at harvest wound left upper thigh as well as post thigh which is tender and has been keeping him up at night. The calf and the rest of the leg is swollen but doing ok. Pt denies fever chills or drainage from wounds  Patients pain was not controlled well with vicodin. He was also given Keflex and told to use warm compresses which helped with the erythema and hematoma at harvest sites.  He now states the pain at he incisions are burning, like needles at these incisions. Pt groin incision is not tender.   VASC. LAB Studies: 09/15/2011  Duplex scan of venous system showed no DVT but positive hematoma in the area of the incisions    Physical Examination  Filed Vitals:   09/28/11 1443  BP: 160/62  Pulse: 79  Resp: 20    Pt is A&O x 3 Gait is normal left lower extremity: Incision/s is/are clean,dry.intact, and  healed. RLE is without  Edema, with no erythema; with hematoma that is much improved from his last visit.  There is overall decreased swelling in the leg and the thigh is now soft. Pt. Is hypersensitive to slight touch at wound sites   Medical Decision Making  Sean Saunders is a 65 y.o. year old male who presents s/p left lower extremity bypass surgery . 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 There is no sign of infection and overall swelling and hematoma is resolving in the leg. We will try Lyrica for this nerve pain. The Pt. Was given a RX for Lyrica 50mg  TID, #90 tabs, 0 refills. Pt. Is to see his PMD  Dr. Velna Hatchet on friday  The patient's bypass incisions are healed The patient's surveillance will included ABI and bypass duplex studies  which will be  in: 2 weeks with appt with Dr. Edilia Bo.    Clinic MD: CS Edilia Bo, MD

## 2011-10-11 ENCOUNTER — Encounter: Payer: Self-pay | Admitting: Vascular Surgery

## 2011-10-12 ENCOUNTER — Ambulatory Visit (INDEPENDENT_AMBULATORY_CARE_PROVIDER_SITE_OTHER): Payer: Medicare Other | Admitting: Vascular Surgery

## 2011-10-12 ENCOUNTER — Encounter: Payer: Self-pay | Admitting: Vascular Surgery

## 2011-10-12 VITALS — BP 163/84 | HR 84 | Temp 98.6°F | Ht 72.0 in | Wt 248.0 lb

## 2011-10-12 DIAGNOSIS — I70219 Atherosclerosis of native arteries of extremities with intermittent claudication, unspecified extremity: Secondary | ICD-10-CM | POA: Insufficient documentation

## 2011-10-12 DIAGNOSIS — M7989 Other specified soft tissue disorders: Secondary | ICD-10-CM

## 2011-10-12 NOTE — Progress Notes (Signed)
CC: followup after left femoropopliteal bypass graft  HPI: Sean Saunders is a 65 y.o. male who had a left femoropopliteal bypass graft. This was on 08/16/2011. He has an aortobifemoral bypass graft done in the past. He comes in today for a routine check. His only complaint has been significant pain from his vein harvest site. This is likely neuropathic pain. He has been taking 600 mg of Neurontin each bedtime.  SOCIAL HISTORY: History  Substance Use Topics  . Smoking status: Current Some Day Smoker -- 0.2 packs/day for 5 years    Types: Cigarettes  . Smokeless tobacco: Never Used   Comment: quit in 2006, and now smokes 1-2 cigarettes/day  . Alcohol Use: Yes     rare    REVIEW OF SYSTEMS: CONSTITUTIONAL:  [ ]  fever   [ ]  chills CARDIOVASCULAR: [ ]  chest pain   [ ]  chest pressure   [ ]  palpitations   [ ]  orthopnea   [ ]  dyspnea on exertion   [ ]  claudication   [ ]  rest pain   [ ]  DVT   [ ]  phlebitis.  PHYSICAL EXAM: Filed Vitals:   10/12/11 1039  BP: 163/84  Pulse: 84  Temp: 98.6 F (37 C)   Body mass index is 33.63 kg/(m^2).  GENERAL: The patient appears their stated age. The vital signs are documented above. CARDIOVASCULAR: There is a regular rate and rhythm without significant murmur appreciated. His left foot is warm and well-perfused with a brisk posterior tibial and peroneal signal with the Doppler. PULMONARY: There is good air exchange bilaterally without wheezing or rales. His incisions in the left leg healed nicely including the groin incision. Swelling in the left leg is markedly improved.   MEDICAL ISSUES: His bypass graft is working well and the toe wound of the left great toe has healed. The main issue is his neuropathic pain. I am changing his Neurontin to 400 mg by mouth 3 times a day. We then have her room to continue increases as necessary to help with this pain. I plan on seeing him back in 6 weeks. He knows to call sooner if he has problems.

## 2011-10-26 ENCOUNTER — Ambulatory Visit: Payer: Medicare Other | Admitting: Licensed Clinical Social Worker

## 2011-11-22 ENCOUNTER — Encounter: Payer: Self-pay | Admitting: Vascular Surgery

## 2011-11-23 ENCOUNTER — Ambulatory Visit (INDEPENDENT_AMBULATORY_CARE_PROVIDER_SITE_OTHER): Payer: Medicare Other | Admitting: Vascular Surgery

## 2011-11-23 ENCOUNTER — Encounter: Payer: Self-pay | Admitting: Vascular Surgery

## 2011-11-23 VITALS — BP 145/73 | HR 65 | Resp 14 | Ht 72.0 in | Wt 245.0 lb

## 2011-11-23 DIAGNOSIS — G5790 Unspecified mononeuropathy of unspecified lower limb: Secondary | ICD-10-CM

## 2011-11-23 DIAGNOSIS — M792 Neuralgia and neuritis, unspecified: Secondary | ICD-10-CM

## 2011-11-23 DIAGNOSIS — I70219 Atherosclerosis of native arteries of extremities with intermittent claudication, unspecified extremity: Secondary | ICD-10-CM

## 2011-11-23 NOTE — Progress Notes (Signed)
Vascular and Vein Specialist of [facility]  Patient name: Sean Saunders MRN: [id number] DOB: [birth year] Sex: male  CC: Follow up after left femoropopliteal bypass graft.  HPI: Sean Saunders is a 65 y.o. male who underwent a left femoral-to-below-knee popliteal artery bypass graft with a non-  reverse translocated saphenous vein graft on 08/11/11. He has been having some neuropathic pain in his left leg and has been on gabapentin. He states this has helped significantly. He does not describe any claudication in the left leg. He has had no rest pain or nonhealing wounds.    Past Medical History  Diagnosis Date  . Hypertension   . Diabetes mellitus   . COPD (chronic obstructive pulmonary disease)   . Hyperlipidemia   . Thyroid disease   . TIA (transient ischemic attack) 08/03/2011    Left sided weakness. 3 episodes Feb-March   SOCIAL HISTORY: History  Substance Use Topics  . Smoking status: Former Smoker -- 5 years    Types: Cigarettes    Quit date: 10/24/2011  . Smokeless tobacco: Never Used   Comment: quit in 2006, and now smokes 1-2 cigarettes/day  . Alcohol Use: Yes     rare    Allergies  Allergen Reactions  . Lunesta   . Niaspan (Niacin (Antihyperlipidemic))     Current Outpatient Prescriptions  Medication Sig Dispense Refill  . amLODipine (NORVASC) 5 MG tablet Take 5 mg by mouth daily.        . clopidogrel (PLAVIX) 75 MG tablet Take 75 mg by mouth daily.        . DULoxetine (CYMBALTA) 60 MG capsule Take 60 mg by mouth daily.        Marland Kitchen ezetimibe (ZETIA) 10 MG tablet Take 10 mg by mouth daily.        . Fingerstix Lancets MISC by Does not apply route.        . Fluticasone-Salmeterol (ADVAIR) 250-50 MCG/DOSE AEPB Inhale 1 puff into the lungs every 12 (twelve) hours.        . furosemide (LASIX) 20 MG tablet Take 40 mg by mouth daily.       Marland Kitchen gabapentin (NEURONTIN) 600 MG tablet Take 600 mg by mouth at bedtime.        . insulin regular (HUMULIN R,NOVOLIN R) 100 units/mL  injection Inject 35 Units into the skin. Take 35 units in am/bkfst. and 10 units in pm/supper      . Insulin Syringe-Needle U-100 (THINPRO INSULIN SYRINGE) 31G X 3/8" 1 ML MISC by Does not apply route 2 (two) times daily.        . irbesartan (AVAPRO) 300 MG tablet Take 300 mg by mouth at bedtime.        Marland Kitchen levothyroxine (SYNTHROID, LEVOTHROID) 137 MCG tablet Take 137 mcg by mouth daily.        . metFORMIN (GLUCOPHAGE) 1000 MG tablet Take 1,000 mg by mouth 2 (two) times daily with a meal.        . nebivolol (BYSTOLIC) 10 MG tablet Take 10 mg by mouth daily.        Marland Kitchen omeprazole (PRILOSEC) 40 MG capsule Take 20 mg by mouth. Take 1/2 tab daily      . simvastatin (ZOCOR) 20 MG tablet Take 20 mg by mouth at bedtime.        . sitaGLIPtin (JANUVIA) 100 MG tablet Take 100 mg by mouth daily.        Marland Kitchen tiotropium (SPIRIVA) 18 MCG inhalation capsule Place 18 mcg  into inhaler and inhale daily.        . cephALEXin (KEFLEX) 500 MG capsule Take 500 mg by mouth 3 (three) times daily.        . cyclobenzaprine (FLEXERIL) 10 MG tablet Take 10 mg by mouth 2 (two) times daily as needed.        Marland Kitchen HYDROcodone-acetaminophen (VICODIN) 5-500 MG per tablet Take 1 tablet by mouth every 6 (six) hours as needed.        . meclizine (ANTIVERT) 25 MG tablet Take 25 mg by mouth 4 (four) times daily as needed.        Marland Kitchen oxycodone (OXY-IR) 5 MG capsule Take 5 mg by mouth. Take 1-2 tabs every 4-6 hrs prn       . pregabalin (LYRICA) 50 MG capsule Take 50 mg by mouth 3 (three) times daily.        Marland Kitchen terbinafine (LAMISIL) 250 MG tablet Take 250 mg by mouth daily.        Marland Kitchen zolpidem (AMBIEN) 5 MG tablet Take 5 mg by mouth at bedtime as needed.          REVIEW OF SYSTEMS: Arly.Keller ] denotes positive finding; [  ] denotes negative finding CARDIOVASCULAR:  [ ]  chest pain   [ ]  chest pressure   [ ]  palpitations   [ ]  orthopnea   [ ]  dyspnea on exertion   [ ]  claudication   [ ]  rest pain   [ ]  DVT   [ ]  phlebitis PULMONARY:   [ ]  productive cough   [  ] asthma   [ ]  wheezing  PHYSICAL EXAM: Filed Vitals:   11/23/11 1120  BP: 145/73  Pulse: 65  Resp: 14  Height: 6' (1.829 m)  Weight: 245 lb (111.131 kg)  SpO2: 98%   Body mass index is 33.23 kg/(m^2). GENERAL: The patient is a well-nourished male, in no acute distress. The vital signs are documented above. CARDIOVASCULAR: There is a regular rate and rhythm without significant murmur appreciated. I do not detect any carotid bruits. He has palpable femoral pulses. Both feet are warm and well-perfused. PULMONARY: There is good air exchange bilaterally without wheezing or rales. ABDOMEN: Soft and non-tender with normal pitched bowel sounds.   MEDICAL ISSUES: His leg swelling has improved significantly. His neuropathic pain is improving. I've encouraged him to stay as active as possible. He quit smoking last month. His wife is also quit smoking which will help. I'll see him back in 6 months. I've ordered and arterial duplex of his graft at that time and ABIs. He knows to call sooner if he has problems.  Lerlene Treadwell S [facility] Specialists of [place] Office: [phone]

## 2011-12-29 DIAGNOSIS — R059 Cough, unspecified: Secondary | ICD-10-CM | POA: Diagnosis not present

## 2011-12-29 DIAGNOSIS — R05 Cough: Secondary | ICD-10-CM | POA: Diagnosis not present

## 2011-12-29 DIAGNOSIS — J449 Chronic obstructive pulmonary disease, unspecified: Secondary | ICD-10-CM | POA: Diagnosis not present

## 2011-12-29 DIAGNOSIS — J441 Chronic obstructive pulmonary disease with (acute) exacerbation: Secondary | ICD-10-CM | POA: Diagnosis not present

## 2011-12-29 DIAGNOSIS — R062 Wheezing: Secondary | ICD-10-CM | POA: Diagnosis not present

## 2012-01-05 DIAGNOSIS — R0989 Other specified symptoms and signs involving the circulatory and respiratory systems: Secondary | ICD-10-CM | POA: Diagnosis not present

## 2012-01-05 DIAGNOSIS — Z79899 Other long term (current) drug therapy: Secondary | ICD-10-CM | POA: Diagnosis not present

## 2012-01-05 DIAGNOSIS — R0609 Other forms of dyspnea: Secondary | ICD-10-CM | POA: Diagnosis not present

## 2012-01-05 DIAGNOSIS — J069 Acute upper respiratory infection, unspecified: Secondary | ICD-10-CM | POA: Diagnosis not present

## 2012-01-05 DIAGNOSIS — W64XXXA Exposure to other animate mechanical forces, initial encounter: Secondary | ICD-10-CM | POA: Diagnosis not present

## 2012-01-05 DIAGNOSIS — Y92009 Unspecified place in unspecified non-institutional (private) residence as the place of occurrence of the external cause: Secondary | ICD-10-CM | POA: Diagnosis not present

## 2012-01-10 ENCOUNTER — Other Ambulatory Visit (INDEPENDENT_AMBULATORY_CARE_PROVIDER_SITE_OTHER): Payer: Self-pay | Admitting: Otolaryngology

## 2012-01-10 DIAGNOSIS — D333 Benign neoplasm of cranial nerves: Secondary | ICD-10-CM

## 2012-01-13 ENCOUNTER — Ambulatory Visit
Admission: RE | Admit: 2012-01-13 | Discharge: 2012-01-13 | Disposition: A | Discharge status: Home / Self Care | Payer: Medicare Other | Source: Ambulatory Visit | Attending: Otolaryngology | Admitting: Otolaryngology

## 2012-01-13 DIAGNOSIS — D333 Benign neoplasm of cranial nerves: Secondary | ICD-10-CM

## 2012-01-13 MED ORDER — GADOBENATE DIMEGLUMINE 529 MG/ML IV SOLN
20.0000 mL | Freq: Once | INTRAVENOUS | Status: AC | PRN
Start: 2012-01-13 — End: 2012-01-13
  Administered 2012-01-13: 20 mL via INTRAVENOUS

## 2012-01-17 DIAGNOSIS — H903 Sensorineural hearing loss, bilateral: Secondary | ICD-10-CM | POA: Diagnosis not present

## 2012-01-17 DIAGNOSIS — D333 Benign neoplasm of cranial nerves: Secondary | ICD-10-CM | POA: Diagnosis not present

## 2012-01-27 DIAGNOSIS — I10 Essential (primary) hypertension: Secondary | ICD-10-CM | POA: Diagnosis not present

## 2012-01-27 DIAGNOSIS — Z8249 Family history of ischemic heart disease and other diseases of the circulatory system: Secondary | ICD-10-CM | POA: Diagnosis not present

## 2012-01-27 DIAGNOSIS — Z79899 Other long term (current) drug therapy: Secondary | ICD-10-CM | POA: Diagnosis not present

## 2012-02-10 DIAGNOSIS — Z79899 Other long term (current) drug therapy: Secondary | ICD-10-CM | POA: Diagnosis not present

## 2012-02-10 DIAGNOSIS — I10 Essential (primary) hypertension: Secondary | ICD-10-CM | POA: Diagnosis not present

## 2012-02-10 DIAGNOSIS — Z8249 Family history of ischemic heart disease and other diseases of the circulatory system: Secondary | ICD-10-CM | POA: Diagnosis not present

## 2012-02-21 DIAGNOSIS — E7800 Pure hypercholesterolemia, unspecified: Secondary | ICD-10-CM | POA: Diagnosis not present

## 2012-02-21 DIAGNOSIS — E1165 Type 2 diabetes mellitus with hyperglycemia: Secondary | ICD-10-CM | POA: Diagnosis not present

## 2012-02-21 DIAGNOSIS — Z79899 Other long term (current) drug therapy: Secondary | ICD-10-CM | POA: Diagnosis not present

## 2012-02-21 DIAGNOSIS — E039 Hypothyroidism, unspecified: Secondary | ICD-10-CM | POA: Diagnosis not present

## 2012-02-21 DIAGNOSIS — N182 Chronic kidney disease, stage 2 (mild): Secondary | ICD-10-CM | POA: Diagnosis not present

## 2012-02-21 DIAGNOSIS — E1129 Type 2 diabetes mellitus with other diabetic kidney complication: Secondary | ICD-10-CM | POA: Diagnosis not present

## 2012-03-02 ENCOUNTER — Ambulatory Visit: Payer: Medicare Other | Admitting: Internal Medicine

## 2012-03-26 DIAGNOSIS — Z79899 Other long term (current) drug therapy: Secondary | ICD-10-CM | POA: Diagnosis not present

## 2012-03-26 DIAGNOSIS — J309 Allergic rhinitis, unspecified: Secondary | ICD-10-CM | POA: Diagnosis not present

## 2012-03-26 DIAGNOSIS — J449 Chronic obstructive pulmonary disease, unspecified: Secondary | ICD-10-CM | POA: Diagnosis not present

## 2012-03-26 DIAGNOSIS — E785 Hyperlipidemia, unspecified: Secondary | ICD-10-CM | POA: Diagnosis not present

## 2012-04-05 DIAGNOSIS — M79609 Pain in unspecified limb: Secondary | ICD-10-CM | POA: Diagnosis not present

## 2012-04-05 DIAGNOSIS — B351 Tinea unguium: Secondary | ICD-10-CM | POA: Diagnosis not present

## 2012-04-11 DIAGNOSIS — E1129 Type 2 diabetes mellitus with other diabetic kidney complication: Secondary | ICD-10-CM | POA: Diagnosis not present

## 2012-04-11 DIAGNOSIS — N182 Chronic kidney disease, stage 2 (mild): Secondary | ICD-10-CM | POA: Diagnosis not present

## 2012-04-11 DIAGNOSIS — K219 Gastro-esophageal reflux disease without esophagitis: Secondary | ICD-10-CM | POA: Diagnosis not present

## 2012-04-11 DIAGNOSIS — L02419 Cutaneous abscess of limb, unspecified: Secondary | ICD-10-CM | POA: Diagnosis not present

## 2012-04-11 DIAGNOSIS — L03119 Cellulitis of unspecified part of limb: Secondary | ICD-10-CM | POA: Diagnosis not present

## 2012-04-17 DIAGNOSIS — K219 Gastro-esophageal reflux disease without esophagitis: Secondary | ICD-10-CM | POA: Diagnosis not present

## 2012-04-17 DIAGNOSIS — E1165 Type 2 diabetes mellitus with hyperglycemia: Secondary | ICD-10-CM | POA: Diagnosis not present

## 2012-04-17 DIAGNOSIS — E1129 Type 2 diabetes mellitus with other diabetic kidney complication: Secondary | ICD-10-CM | POA: Diagnosis not present

## 2012-04-17 DIAGNOSIS — L02419 Cutaneous abscess of limb, unspecified: Secondary | ICD-10-CM | POA: Diagnosis not present

## 2012-04-17 DIAGNOSIS — Z794 Long term (current) use of insulin: Secondary | ICD-10-CM | POA: Diagnosis not present

## 2012-04-27 DIAGNOSIS — B351 Tinea unguium: Secondary | ICD-10-CM | POA: Diagnosis not present

## 2012-04-27 DIAGNOSIS — L600 Ingrowing nail: Secondary | ICD-10-CM | POA: Diagnosis not present

## 2012-04-27 DIAGNOSIS — E1059 Type 1 diabetes mellitus with other circulatory complications: Secondary | ICD-10-CM | POA: Diagnosis not present

## 2012-05-22 ENCOUNTER — Encounter: Payer: Self-pay | Admitting: Vascular Surgery

## 2012-05-22 DIAGNOSIS — E1165 Type 2 diabetes mellitus with hyperglycemia: Secondary | ICD-10-CM | POA: Diagnosis not present

## 2012-05-22 DIAGNOSIS — E785 Hyperlipidemia, unspecified: Secondary | ICD-10-CM | POA: Diagnosis not present

## 2012-05-22 DIAGNOSIS — J449 Chronic obstructive pulmonary disease, unspecified: Secondary | ICD-10-CM | POA: Diagnosis not present

## 2012-05-22 DIAGNOSIS — E1129 Type 2 diabetes mellitus with other diabetic kidney complication: Secondary | ICD-10-CM | POA: Diagnosis not present

## 2012-05-22 DIAGNOSIS — I1310 Hypertensive heart and chronic kidney disease without heart failure, with stage 1 through stage 4 chronic kidney disease, or unspecified chronic kidney disease: Secondary | ICD-10-CM | POA: Diagnosis not present

## 2012-05-22 DIAGNOSIS — R351 Nocturia: Secondary | ICD-10-CM | POA: Diagnosis not present

## 2012-05-22 DIAGNOSIS — Z0000 Encounter for general adult medical examination without abnormal findings: Secondary | ICD-10-CM | POA: Diagnosis not present

## 2012-05-22 DIAGNOSIS — Z125 Encounter for screening for malignant neoplasm of prostate: Secondary | ICD-10-CM | POA: Diagnosis not present

## 2012-05-22 DIAGNOSIS — N182 Chronic kidney disease, stage 2 (mild): Secondary | ICD-10-CM | POA: Diagnosis not present

## 2012-05-22 DIAGNOSIS — E559 Vitamin D deficiency, unspecified: Secondary | ICD-10-CM | POA: Diagnosis not present

## 2012-05-22 DIAGNOSIS — E039 Hypothyroidism, unspecified: Secondary | ICD-10-CM | POA: Diagnosis not present

## 2012-05-23 ENCOUNTER — Encounter (INDEPENDENT_AMBULATORY_CARE_PROVIDER_SITE_OTHER): Payer: Medicare Other | Admitting: *Deleted

## 2012-05-23 ENCOUNTER — Ambulatory Visit: Payer: Medicare Other | Admitting: Vascular Surgery

## 2012-05-23 ENCOUNTER — Ambulatory Visit (INDEPENDENT_AMBULATORY_CARE_PROVIDER_SITE_OTHER): Payer: Medicare Other | Admitting: Vascular Surgery

## 2012-05-23 ENCOUNTER — Ambulatory Visit (INDEPENDENT_AMBULATORY_CARE_PROVIDER_SITE_OTHER): Payer: Medicare Other | Admitting: *Deleted

## 2012-05-23 ENCOUNTER — Encounter: Payer: Self-pay | Admitting: Vascular Surgery

## 2012-05-23 ENCOUNTER — Other Ambulatory Visit: Payer: Medicare Other

## 2012-05-23 VITALS — BP 154/70 | HR 89 | Resp 20 | Ht 72.0 in | Wt 238.0 lb

## 2012-05-23 DIAGNOSIS — I70509 Unspecified atherosclerosis of nonautologous biological bypass graft(s) of the extremities, unspecified extremity: Secondary | ICD-10-CM

## 2012-05-23 DIAGNOSIS — Z48812 Encounter for surgical aftercare following surgery on the circulatory system: Secondary | ICD-10-CM

## 2012-05-23 DIAGNOSIS — I70219 Atherosclerosis of native arteries of extremities with intermittent claudication, unspecified extremity: Secondary | ICD-10-CM | POA: Diagnosis not present

## 2012-05-23 NOTE — Progress Notes (Signed)
Vascular and Vein Specialist of [place]  Patient name: Sean Saunders MRN: [id number] DOB: 06/17/1946 Sex: male  REASON FOR VISIT: follow up of left femoropopliteal bypass graft  HPI: Teyon C Hartgrove is a 66 y.o. male who underwent a previous aortobifemoral bypass graft. This was done in 2006. In 2012 he had a left femoral to below knee pop bypass with a vein graft. Comes in for a routine follow up visit. He describes some paresthesias along the lateral aspect of his left leg and also some chronic pain in the medial aspect of his left leg. He describes some claudication the calf but this is mild. He's had no rest pain. His had no symptoms in the right leg. He's had no problems with nonhealing wounds.   REVIEW OF SYSTEMS: [X ] denotes positive finding; [  ] denotes negative finding  CARDIOVASCULAR:  [ ] chest pain   [ ] dyspnea on exertion    CONSTITUTIONAL:  [ ] fever   [ ] chills  PHYSICAL EXAM: Filed Vitals:   05/23/12 1529  BP: 154/70  Pulse: 89  Resp: 20  Height: 6' (1.829 m)  Weight: 238 lb (107.956 kg)   Body mass index is 32.28 kg/(m^2). GENERAL: The patient is a well-nourished male, in no acute distress. The vital signs are documented above. CARDIOVASCULAR: There is a regular rate and rhythm  PULMONARY: There is good air exchange bilaterally without wheezing or rales. He has palpable femoral pulses in his aortobifemoral bypass graft is patent. He has a palpable posterior tibial pulse bilaterally.  I have independently interpreted his duplex of his femoropopliteal graft which shows an area of increased velocity in the distal vein graft with peak systolic velocity of 586 cm/s. The remainder the graft is patent. His ABIs on the right is 100% on the left is ABI is 77%. He has monophasic Doppler signals in the left foot with triphasic Doppler signals on the right.  MEDICAL ISSUES: Given the vein graft stenosis in his left femoropopliteal bypass graft I have recommended we proceed  with arteriography.I have reviewed with the patient the indications for arteriography. In addition, I have reviewed the potential complications of arteriography including but not limited to: Bleeding, arterial injury, arterial thrombosis, dye action, renal insufficiency, or other unpredictable medical problems. His arteriogram is scheduled for 06/04/2012. I've explained that because he has an aortobifemoral bypass graft, it is unlikely that he would be a candidate for an endovascular approach to his vein graft stenosis. We'll make further recommendations pending the results of his arteriogram.  Ilah Boule S [facility] and Vein Specialists of [place] Beeper: [phone]     

## 2012-05-24 ENCOUNTER — Other Ambulatory Visit: Payer: Self-pay

## 2012-05-28 ENCOUNTER — Encounter (HOSPITAL_COMMUNITY): Payer: Self-pay | Admitting: Respiratory Therapy

## 2012-06-04 ENCOUNTER — Other Ambulatory Visit: Payer: Self-pay | Admitting: *Deleted

## 2012-06-04 ENCOUNTER — Encounter (HOSPITAL_COMMUNITY)
Admission: RE | Disposition: A | Discharge status: Home / Self Care | Payer: Self-pay | Source: Ambulatory Visit | Attending: Vascular Surgery

## 2012-06-04 ENCOUNTER — Telehealth: Payer: Self-pay | Admitting: Vascular Surgery

## 2012-06-04 ENCOUNTER — Ambulatory Visit (HOSPITAL_COMMUNITY)
Admission: RE | Admit: 2012-06-04 | Discharge: 2012-06-04 | Disposition: A | Discharge status: Home / Self Care | Payer: Medicare Other | Source: Ambulatory Visit | Attending: Vascular Surgery | Admitting: Vascular Surgery

## 2012-06-04 DIAGNOSIS — I739 Peripheral vascular disease, unspecified: Secondary | ICD-10-CM | POA: Diagnosis not present

## 2012-06-04 DIAGNOSIS — I70219 Atherosclerosis of native arteries of extremities with intermittent claudication, unspecified extremity: Secondary | ICD-10-CM | POA: Insufficient documentation

## 2012-06-04 DIAGNOSIS — Z9889 Other specified postprocedural states: Secondary | ICD-10-CM | POA: Diagnosis not present

## 2012-06-04 DIAGNOSIS — Z01810 Encounter for preprocedural cardiovascular examination: Secondary | ICD-10-CM

## 2012-06-04 HISTORY — PX: ABDOMINAL AORTAGRAM: SHX5454

## 2012-06-04 LAB — GLUCOSE, CAPILLARY: Glucose-Capillary: 190 mg/dL — ABNORMAL HIGH (ref 70–99)

## 2012-06-04 LAB — POCT I-STAT, CHEM 8
BUN: 25 mg/dL — ABNORMAL HIGH (ref 6–23)
Calcium, Ion: 1.2 mmol/L (ref 1.12–1.32)
Chloride: 108 mEq/L (ref 96–112)
Creatinine, Ser: 1.3 mg/dL (ref 0.50–1.35)
Glucose, Bld: 234 mg/dL — ABNORMAL HIGH (ref 70–99)
HCT: 45 % (ref 39.0–52.0)
Hemoglobin: 15.3 g/dL (ref 13.0–17.0)
Potassium: 4.9 mEq/L (ref 3.5–5.1)
Sodium: 140 mEq/L (ref 135–145)
TCO2: 21 mmol/L (ref 0–100)

## 2012-06-04 SURGERY — ABDOMINAL AORTAGRAM
Anesthesia: LOCAL

## 2012-06-04 MED ORDER — MIDAZOLAM HCL 2 MG/2ML IJ SOLN
INTRAMUSCULAR | Status: AC
  Filled 2012-06-04: qty 2

## 2012-06-04 MED ORDER — HEPARIN (PORCINE) IN NACL 2-0.9 UNIT/ML-% IJ SOLN
INTRAMUSCULAR | Status: AC
  Filled 2012-06-04: qty 1000

## 2012-06-04 MED ORDER — SODIUM CHLORIDE 0.9 % IV SOLN
INTRAVENOUS | Status: DC
  Administered 2012-06-04: 08:00:00 via INTRAVENOUS

## 2012-06-04 MED ORDER — LIDOCAINE HCL (PF) 1 % IJ SOLN
INTRAMUSCULAR | Status: AC
  Filled 2012-06-04: qty 30

## 2012-06-04 MED ORDER — SODIUM CHLORIDE 0.9 % IV SOLN: 1.0000 mL/kg/h | INTRAVENOUS | Status: DC

## 2012-06-04 MED ORDER — ACETAMINOPHEN 325 MG PO TABS
650.0000 mg | ORAL_TABLET | ORAL | Status: DC | PRN
  Administered 2012-06-04: 650 mg via ORAL
  Filled 2012-06-04: qty 2

## 2012-06-04 MED ORDER — FENTANYL CITRATE 0.05 MG/ML IJ SOLN
INTRAMUSCULAR | Status: AC
  Filled 2012-06-04: qty 2

## 2012-06-04 MED ORDER — ONDANSETRON HCL 4 MG/2ML IJ SOLN: 4.0000 mg | Freq: Four times a day (QID) | INTRAMUSCULAR | Status: DC | PRN

## 2012-06-04 NOTE — H&P (View-Only) (Signed)
Vascular and Vein Specialist of [facility]  Patient name: Sean Saunders MRN: [id number] DOB: 1946-01-14 Sex: male  REASON FOR VISIT: follow up of left femoropopliteal bypass graft  HPI: CARLOS HEBER is a 66 y.o. male who underwent a previous aortobifemoral bypass graft. This was done in 2006. In 2012 he had a left femoral to below knee pop bypass with a vein graft. Comes in for a routine follow up visit. He describes some paresthesias along the lateral aspect of his left leg and also some chronic pain in the medial aspect of his left leg. He describes some claudication the calf but this is mild. He's had no rest pain. His had no symptoms in the right leg. He's had no problems with nonhealing wounds.   REVIEW OF SYSTEMS: Arly.Keller ] denotes positive finding; [  ] denotes negative finding  CARDIOVASCULAR:  [ ]  chest pain   [ ]  dyspnea on exertion    CONSTITUTIONAL:  [ ]  fever   [ ]  chills  PHYSICAL EXAM: Filed Vitals:   05/23/12 1529  BP: 154/70  Pulse: 89  Resp: 20  Height: 6' (1.829 m)  Weight: 238 lb (107.956 kg)   Body mass index is 32.28 kg/(m^2). GENERAL: The patient is a well-nourished male, in no acute distress. The vital signs are documented above. CARDIOVASCULAR: There is a regular rate and rhythm  PULMONARY: There is good air exchange bilaterally without wheezing or rales. He has palpable femoral pulses in his aortobifemoral bypass graft is patent. He has a palpable posterior tibial pulse bilaterally.  I have independently interpreted his duplex of his femoropopliteal graft which shows an area of increased velocity in the distal vein graft with peak systolic velocity of 586 cm/s. The remainder the graft is patent. His ABIs on the right is 100% on the left is ABI is 77%. He has monophasic Doppler signals in the left foot with triphasic Doppler signals on the right.  MEDICAL ISSUES: Given the vein graft stenosis in his left femoropopliteal bypass graft I have recommended we proceed  with arteriography.I have reviewed with the patient the indications for arteriography. In addition, I have reviewed the potential complications of arteriography including but not limited to: Bleeding, arterial injury, arterial thrombosis, dye action, renal insufficiency, or other unpredictable medical problems. His arteriogram is scheduled for 06/04/2012. I've explained that because he has an aortobifemoral bypass graft, it is unlikely that he would be a candidate for an endovascular approach to his vein graft stenosis. We'll make further recommendations pending the results of his arteriogram.  Kolbe Delmonaco S [facility] Vein Specialists of [place] Beeper: [phone]

## 2012-06-04 NOTE — Telephone Encounter (Signed)
Message copied by Fredrich Birks on Mon Jun 04, 2012  2:29 PM ------      Message from: Melene Plan      Created: Mon Jun 04, 2012 11:34 AM      Regarding: FW: charge and F/U                   ----- Message -----         From: Chuck Hint, MD         Sent: 06/04/2012  11:19 AM           To: Reuel Derby, Melene Plan, RN      Subject: charge and F/U                                           PROCEDURE:       1. Ultrasound-guided access to the left limb of the aortobifemoral bypass graft      2. Aortogram with bilateral iliac arteriogram.      3. Selective catheterization of the right limb of the aorto bifemoral bypass graft with right lower extremity runoff      4. Retrograde left femoral arteriogram with left lower extremity runoff            SURGEON: Di Kindle. Edilia Bo, MD, FACS            He needs a follow visit in 2-3 weeks. When he comes in he needs to have vein mapping of the left leg and potentially the right leg as I will need to revise his left femoropopliteal bypass graft.            CSD

## 2012-06-04 NOTE — Op Note (Signed)
PATIENT: Sean Saunders   MRN: [id number] DOB: 01-17-1946    DATE OF PROCEDURE: 06/04/2012  INDICATIONS: DEQUON SCHNEBLY is a 66 y.o. male who underwent a left femoral to below knee popliteal artery bypass with a vein graft on 08/11/2011. A routine follow up study was noted to have some elevated velocities in the distal vein graft above the anastomosis to the below knee popliteal artery. Peak systolic velocity in the serious 586 cm/s. ABI of 77% on the left 100% on the right. He has a functioning aortobifemoral bypass graft.  PROCEDURE:  1. Ultrasound-guided access to the left limb of the aortobifemoral bypass graft 2. Aortogram with bilateral iliac arteriogram. 3. Selective catheterization of the right limb of the aorto bifemoral bypass graft with right lower extremity runoff 4. Retrograde left femoral arteriogram with left lower extremity runoff  SURGEON: Di Kindle. Edilia Bo, MD, FACS  ANESTHESIA: local with sedation   EBL: minimal  TECHNIQUE: The patient was brought to the peripheral vascular lab and received a milligram of Versed and 50 mcg of fentanyl. He subsequently received an additional milligram of Versed. Groins were prepped and draped in the usual sterile fashion. After the skin was infiltrated with 1% lidocaine, and under ultrasound guidance, the left limb of the aortobifemoral bypass graft was cannulated and a guidewire introduced into the infrarenal aorta. A 5 French sheath was introduced over the wire. A pigtail catheter was positioned at the L1 vertebral body and flush aortogram obtained. This catheter was exchanged for a shepard hook catheter which was positioned into the right limb of the aortobifemoral bypass graft. Selective right lower extremity runoff was obtained. Next the catheter was removed over a wire and a retrograde left femoral arteriogram obtained with left lower extremity runoff. At the completion of the procedure the sheath was removed and pressure held for  hemostasis. No immediate complications were noted.  FINDINGS:  1. The aortobifemoral bypass graft is widely patent. There were single renal arteries bilaterally with no significant renal artery stenosis identified. 2. On the left side, the aortofemoral graft is anastomosed to the superficial femoral artery. The vein bypass graft originates off the distal aortic graft in his anastomosis to the below knee popliteal artery. There is an area of moderate to severe stenosis in the vein graft behind the knee. The anastomosis itself was widely patent. The three-vessel runoff on the left via the anterior tibial, posterior tibial, and peroneal arteries. 3. On the right side, the common femoral, superficial femoral, and deep femoral arteries are patent. There is mild 20% narrowing at the adductor canal on the right. The popliteal artery, anterior tibial, peroneal, and posterior tibial arteries are patent on the right.  Waverly Ferrari, MD, FACS [facility]ascular and Vein Specialists of [facility]  DATE OF DICTATION:   06/04/2012

## 2012-06-04 NOTE — Telephone Encounter (Signed)
Notified patients wife of appt, dpm

## 2012-06-04 NOTE — Discharge Instructions (Signed)

## 2012-06-04 NOTE — Interval H&P Note (Signed)
History and Physical Interval Note:  06/04/2012 10:10 AM  Sean Saunders  has presented today for surgery, with the diagnosis of PVD  The various methods of treatment have been discussed with the patient and family. After consideration of risks, benefits and other options for treatment, the patient has consented to: ABDOMINAL AORTAGRAM AND RUNOFF. The patient's history has been reviewed, patient examined, no change in status, stable for surgery.  I have reviewed the patients' chart and labs.  Questions were answered to the patient's satisfaction.     [name]

## 2012-06-05 DIAGNOSIS — M79609 Pain in unspecified limb: Secondary | ICD-10-CM | POA: Diagnosis not present

## 2012-06-05 DIAGNOSIS — L600 Ingrowing nail: Secondary | ICD-10-CM | POA: Diagnosis not present

## 2012-06-05 DIAGNOSIS — L03039 Cellulitis of unspecified toe: Secondary | ICD-10-CM | POA: Diagnosis not present

## 2012-06-05 DIAGNOSIS — E109 Type 1 diabetes mellitus without complications: Secondary | ICD-10-CM | POA: Diagnosis not present

## 2012-06-06 NOTE — Procedures (Unsigned)
BYPASS GRAFT EVALUATION  INDICATION:  Follow up left lower extremity bypass graft.  HISTORY: COPD. Diabetes:  Yes. Cardiac: Hypertension:  Yes. Smoking:  Previous. Previous Surgery:  Left femoral-to-popliteal bypass graft (BK) non- reversed saphenous vein 08/11/2011.  Aorta-bifemoral bypass graft 08/11/2005.  SINGLE LEVEL ARTERIAL EXAM                              RIGHT              LEFT Brachial: Anterior tibial: Posterior tibial: Peroneal: Ankle/brachial index:        1.12               0.73  PREVIOUS ABI:  Date: 07/22/2011 prior to surgery  RIGHT:  1.06  LEFT: 0.53  LOWER EXTREMITY BYPASS GRAFT DUPLEX EXAM:  DUPLEX:  Duplex imaging of the left lower extremity bypass graft reveals increased velocity of 586 cm with narrowing visualized in the distal thigh graft.  IMPRESSION: 1. Patent left femoral-to-popliteal bypass graft with narrowing and     increased velocity in the distal thigh as noted above. 2. See attached for ankle brachial indices.  ___________________________________________ Di Kindle. Edilia Bo, M.D.  SS/MEDQ  D:  05/23/2012  T:  05/23/2012  Job:  [id number]

## 2012-06-19 DIAGNOSIS — L600 Ingrowing nail: Secondary | ICD-10-CM | POA: Diagnosis not present

## 2012-06-19 DIAGNOSIS — E109 Type 1 diabetes mellitus without complications: Secondary | ICD-10-CM | POA: Diagnosis not present

## 2012-06-19 DIAGNOSIS — L03039 Cellulitis of unspecified toe: Secondary | ICD-10-CM | POA: Diagnosis not present

## 2012-06-26 ENCOUNTER — Encounter: Payer: Self-pay | Admitting: Vascular Surgery

## 2012-06-27 ENCOUNTER — Encounter: Payer: Self-pay | Admitting: Vascular Surgery

## 2012-06-27 ENCOUNTER — Other Ambulatory Visit: Payer: Self-pay | Admitting: *Deleted

## 2012-06-27 ENCOUNTER — Encounter (INDEPENDENT_AMBULATORY_CARE_PROVIDER_SITE_OTHER): Payer: Medicare Other | Admitting: *Deleted

## 2012-06-27 ENCOUNTER — Ambulatory Visit (INDEPENDENT_AMBULATORY_CARE_PROVIDER_SITE_OTHER): Payer: Medicare Other | Admitting: Vascular Surgery

## 2012-06-27 VITALS — BP 121/93 | HR 58 | Temp 98.2°F | Ht 72.0 in | Wt 237.0 lb

## 2012-06-27 DIAGNOSIS — I739 Peripheral vascular disease, unspecified: Secondary | ICD-10-CM

## 2012-06-27 DIAGNOSIS — I70219 Atherosclerosis of native arteries of extremities with intermittent claudication, unspecified extremity: Secondary | ICD-10-CM | POA: Diagnosis not present

## 2012-06-27 DIAGNOSIS — Z01810 Encounter for preprocedural cardiovascular examination: Secondary | ICD-10-CM

## 2012-06-27 NOTE — Progress Notes (Signed)
Vascular and Vein Specialist of [place]  Patient name: Sean Saunders MRN: [id number] DOB: 04/08/1946 Sex: male  CC: vein graft stenosis of left femoropopliteal bypass graft  HPI: Sean Saunders is a 66 y.o. male who had an aortobifemoral bypass graft in August of 2006. In a August of 2012 he had a left femoral to below knee popliteal artery bypass graft with a non-her saphenous vein graft. A routine follow duplex he was noted to have an area of increased velocities within his vein graft behind the knee. Peak systolic velocity in this area was 586 cm/s suggesting a severe stenosis. He underwent arteriogram which confirmed a stenosis in this area although it did not look quite as bad as the velocities suggest. However I think the arteriogram oftentimes underestimates the severity of the stenosis then certainly based on the velocities the stenosis puts the graft at risk for thrombosis. The patient describes some mild claudication symptoms in the left but not on the right. He's had no rest pain no history nonhealing ulcers. He does have a history of neuropathy.  Past Medical History  Diagnosis Date  . Hypertension   . Diabetes mellitus   . COPD (chronic obstructive pulmonary disease)   . Hyperlipidemia   . Thyroid disease   . TIA (transient ischemic attack) 08/03/2011    Left sided weakness. 3 episodes Feb-March    Family History  Problem Relation Age of Onset  . Stroke Mother   . Diabetes Mother   . Other Father     alzheimers  . Diabetes Father   . Stroke Father   . Diabetes Sister     SOCIAL HISTORY: History  Substance Use Topics  . Smoking status: Current Everyday Smoker -- 0.5 packs/day for 5 years    Types: Cigarettes  . Smokeless tobacco: Never Used  . Alcohol Use: Yes     rare    Allergies  Allergen Reactions  . Eszopiclone   . Niaspan (Niacin Er)     Current Outpatient Prescriptions  Medication Sig Dispense Refill  . amLODipine (NORVASC) 10 MG tablet Take 10  mg by mouth daily.      . clopidogrel (PLAVIX) 75 MG tablet Take 75 mg by mouth daily.        . Fluticasone-Salmeterol (ADVAIR) 250-50 MCG/DOSE AEPB Inhale 1 puff into the lungs every 12 (twelve) hours.        . furosemide (LASIX) 20 MG tablet Take 20 mg by mouth daily.       . gabapentin (NEURONTIN) 600 MG tablet Take 600 mg by mouth at bedtime.        . insulin regular human CONCENTRATED (HUMULIN R) 500 UNIT/ML SOLN injection Inject 10-35 Units into the skin 2 (two) times daily. Take 35 units in the morning and 10 units in the evening      . levothyroxine (SYNTHROID, LEVOTHROID) 137 MCG tablet Take 137 mcg by mouth daily.        . metFORMIN (GLUCOPHAGE XR) 500 MG 24 hr tablet Take 1,000 mg by mouth 2 (two) times daily.      . nebivolol (BYSTOLIC) 5 MG tablet Take 5 mg by mouth daily.      . omeprazole (PRILOSEC) 20 MG capsule Take 20 mg by mouth 2 (two) times daily.      . rosuvastatin (CRESTOR) 10 MG tablet Take 10 mg by mouth daily.      . simvastatin (ZOCOR) 40 MG tablet Take 40 mg by mouth every evening.      .   sitaGLIPtin (JANUVIA) 100 MG tablet Take 100 mg by mouth daily.        . tiotropium (SPIRIVA) 18 MCG inhalation capsule Place 18 mcg into inhaler and inhale daily.        . valsartan (DIOVAN) 320 MG tablet Take 320 mg by mouth daily.        REVIEW OF SYSTEMS: [X ] denotes positive finding; [  ] denotes negative finding CARDIOVASCULAR:  [ ] chest pain   [ ] chest pressure   [ ] palpitations   [ ] orthopnea   [X ] dyspnea on exertion   [ ] claudication   [ ] rest pain   [ ] DVT   [ ] phlebitis PULMONARY:   [ ] productive cough   [ ] asthma   [ ] wheezing NEUROLOGIC:   [ ] weakness  [ ] paresthesias  [ ] aphasia  [ ] amaurosis  [ ] dizziness HEMATOLOGIC:   [ ] bleeding problems   [ ] clotting disorders MUSCULOSKELETAL:  [ ] joint pain   [ ] joint swelling [ ] leg swelling GASTROINTESTINAL: [ ]  blood in stool  [ ]  hematemesis GENITOURINARY:  [ ]  dysuria  [ ]   hematuria PSYCHIATRIC:  [ ] history of major depression INTEGUMENTARY:  [ ] rashes  [ ] ulcers CONSTITUTIONAL:  [ ] fever   [ ] chills  PHYSICAL EXAM: Filed Vitals:   06/27/12 1150  BP: 121/93  Pulse: 58  Temp: 98.2 F (36.8 C)  TempSrc: Oral  Height: 6' (1.829 m)  Weight: 237 lb (107.502 kg)  SpO2: 99%   Body mass index is 32.14 kg/(m^2). GENERAL: The patient is a well-nourished male, in no acute distress. The vital signs are documented above. CARDIOVASCULAR: There is a regular rate and rhythm without significant murmur appreciated. He has palpable femoral pulses. Cannot palpate popliteal or pedal pulses on the left. Both feet are warm and well-perfused. PULMONARY: There is good air exchange bilaterally without wheezing or rales. ABDOMEN: Soft and non-tender with normal pitched bowel sounds.  MUSCULOSKELETAL: There are no major deformities or cyanosis. NEUROLOGIC: No focal weakness or paresthesias are detected. SKIN: There are no ulcers or rashes noted. PSYCHIATRIC: The patient has a normal affect.  DATA:  I reviewed his chart. Gram which shows a stenosis in the vein graft at the level of the knee.  MEDICAL ISSUES: Given the severity of the stenosis in the femoropopliteal bypass graft on the left based on the duplex findings, I think the graft is at significant risk for thrombosis. Therefore I have recommended revision of his bypass graft. I think this will involve bypassing from the vein grafts above-the-knee to the vein graft below the knee using saphenous vein graft. Vein was somewhat small in the left side and I suspect we'll have to take vein from the right thigh in order to have an adequate size vein for bypass. I have reviewed the indications for lower extremity bypass. I have also reviewed the potential complications of surgery including but not limited to: wound healing problems, infection, graft thrombosis, limb loss, or other unpredictable medical problems. All the  patient's questions were answered and they are agreeable to proceed. The surgery has been scheduled for 07/05/2012.   DICKSON,CHRISTOPHER S [facility] of [place] Office: [phone]   

## 2012-07-02 NOTE — Pre-Procedure Instructions (Signed)
20 ALOK MINSHALL  07/02/2012   Your procedure is scheduled on:  Thursday July 05, 2012.  Report to [facility] at 0530 AM.  Call this number if you have problems the morning of surgery: [phone]   Remember:   Do not eat food or drink:After Midnight.    Take these medicines the morning of surgery with A SIP OF WATER: Amlodipine (Norvasc), Advair inhaler, Gabapentin (Neurontin), Levothyroxine (Synthroid), Nebivolol (Bystolic), Omeprazole (Prilosec) and Spiriva inhaler.  Take half usual dose of insulin the night before surgery.   Do not wear jewelry.  Do not wear lotions or colognes.   Men may shave face and neck.  Do not bring valuables to the hospital.  Contacts, dentures or bridgework may not be worn into surgery.  Leave suitcase in the car. After surgery it may be brought to your room.  For patients admitted to the hospital, checkout time is 11:00 AM the day of discharge.   Patients discharged the day of surgery will not be allowed to drive home.  Name and phone number of your driver:   Special Instructions: CHG Shower Use Special Wash: 1/2 bottle night before surgery and 1/2 bottle morning of surgery.   Please read over the following fact sheets that you were given: Pain Booklet, Coughing and Deep Breathing, MRSA Information and Surgical Site Infection Prevention

## 2012-07-03 ENCOUNTER — Encounter (HOSPITAL_COMMUNITY)
Admission: RE | Admit: 2012-07-03 | Discharge: 2012-07-03 | Disposition: A | Discharge status: Home / Self Care | Payer: Medicare Other | Source: Ambulatory Visit | Attending: Vascular Surgery | Admitting: Vascular Surgery

## 2012-07-03 ENCOUNTER — Encounter (HOSPITAL_COMMUNITY): Payer: Self-pay

## 2012-07-03 ENCOUNTER — Ambulatory Visit (HOSPITAL_COMMUNITY)
Admission: RE | Admit: 2012-07-03 | Discharge: 2012-07-03 | Disposition: A | Discharge status: Home / Self Care | Payer: Medicare Other | Source: Ambulatory Visit | Attending: Vascular Surgery | Admitting: Vascular Surgery

## 2012-07-03 DIAGNOSIS — Z01812 Encounter for preprocedural laboratory examination: Secondary | ICD-10-CM | POA: Insufficient documentation

## 2012-07-03 DIAGNOSIS — Z01818 Encounter for other preprocedural examination: Secondary | ICD-10-CM | POA: Insufficient documentation

## 2012-07-03 DIAGNOSIS — J984 Other disorders of lung: Secondary | ICD-10-CM | POA: Insufficient documentation

## 2012-07-03 DIAGNOSIS — I10 Essential (primary) hypertension: Secondary | ICD-10-CM | POA: Insufficient documentation

## 2012-07-03 DIAGNOSIS — Z01811 Encounter for preprocedural respiratory examination: Secondary | ICD-10-CM | POA: Diagnosis not present

## 2012-07-03 HISTORY — DX: Hypothyroidism, unspecified: E03.9

## 2012-07-03 HISTORY — DX: Gastro-esophageal reflux disease without esophagitis: K21.9

## 2012-07-03 HISTORY — DX: Bronchitis, not specified as acute or chronic: J40

## 2012-07-03 LAB — CBC
HCT: 44.6 % (ref 39.0–52.0)
Hemoglobin: 14.9 g/dL (ref 13.0–17.0)
MCH: 28.7 pg (ref 26.0–34.0)
MCHC: 33.4 g/dL (ref 30.0–36.0)
MCV: 85.9 fL (ref 78.0–100.0)
Platelets: 167 10*3/uL (ref 150–400)
RBC: 5.19 MIL/uL (ref 4.22–5.81)
RDW: 14.5 % (ref 11.5–15.5)
WBC: 11.2 10*3/uL — ABNORMAL HIGH (ref 4.0–10.5)

## 2012-07-03 LAB — COMPREHENSIVE METABOLIC PANEL
ALT: 16 U/L (ref 0–53)
AST: 13 U/L (ref 0–37)
Albumin: 4 g/dL (ref 3.5–5.2)
Alkaline Phosphatase: 83 U/L (ref 39–117)
BUN: 27 mg/dL — ABNORMAL HIGH (ref 6–23)
CO2: 22 mEq/L (ref 19–32)
Calcium: 10.3 mg/dL (ref 8.4–10.5)
Chloride: 101 mEq/L (ref 96–112)
Creatinine, Ser: 1.14 mg/dL (ref 0.50–1.35)
GFR calc Af Amer: 75 mL/min — ABNORMAL LOW (ref >90)
GFR calc non Af Amer: 65 mL/min — ABNORMAL LOW (ref >90)
Glucose, Bld: 195 mg/dL — ABNORMAL HIGH (ref 70–99)
Potassium: 5.4 mEq/L — ABNORMAL HIGH (ref 3.5–5.1)
Sodium: 135 mEq/L (ref 135–145)
Total Bilirubin: 0.2 mg/dL — ABNORMAL LOW (ref 0.3–1.2)
Total Protein: 7 g/dL (ref 6.0–8.3)

## 2012-07-03 LAB — URINALYSIS, ROUTINE W REFLEX MICROSCOPIC
Bilirubin Urine: NEGATIVE
Glucose, UA: NEGATIVE mg/dL
Hgb urine dipstick: NEGATIVE
Ketones, ur: NEGATIVE mg/dL
Leukocytes, UA: NEGATIVE
Nitrite: NEGATIVE
Protein, ur: 30 mg/dL — AB
Specific Gravity, Urine: 1.022 (ref 1.005–1.030)
Urobilinogen, UA: 0.2 mg/dL (ref 0.0–1.0)
pH: 5.5 (ref 5.0–8.0)

## 2012-07-03 LAB — URINE MICROSCOPIC-ADD ON

## 2012-07-03 LAB — SURGICAL PCR SCREEN
MRSA, PCR: NEGATIVE
Staphylococcus aureus: NEGATIVE

## 2012-07-03 LAB — TYPE AND SCREEN
ABO/RH(D): AB POS
Antibody Screen: NEGATIVE

## 2012-07-03 LAB — PROTIME-INR
INR: 1.09 (ref 0.00–1.49)
Prothrombin Time: 14.3 seconds (ref 11.6–15.2)

## 2012-07-03 LAB — APTT: aPTT: 32 seconds (ref 24–37)

## 2012-07-03 NOTE — Progress Notes (Signed)
Patient informed Nurse that he had a stress test but was unaware of where it was performed. Patient denied having a cardiac cath. Patient also informed Nurse that a sleep study was performed however it was negative and he denied having a BiPAP or CPAP machine.

## 2012-07-04 MED ORDER — SODIUM CHLORIDE 0.9 % IV SOLN: INTRAVENOUS | Status: DC

## 2012-07-04 MED ORDER — DEXTROSE 5 % IV SOLN
1.5000 g | INTRAVENOUS | Status: AC
  Administered 2012-07-05: 1.5 g via INTRAVENOUS
  Filled 2012-07-04: qty 1.5

## 2012-07-05 ENCOUNTER — Ambulatory Visit (HOSPITAL_COMMUNITY): Payer: Medicare Other | Admitting: Certified Registered"

## 2012-07-05 ENCOUNTER — Encounter (HOSPITAL_COMMUNITY): Payer: Self-pay

## 2012-07-05 ENCOUNTER — Encounter (HOSPITAL_COMMUNITY): Payer: Self-pay | Admitting: Certified Registered"

## 2012-07-05 ENCOUNTER — Encounter (HOSPITAL_COMMUNITY): Payer: Self-pay | Admitting: *Deleted

## 2012-07-05 ENCOUNTER — Inpatient Hospital Stay (HOSPITAL_COMMUNITY)
Admission: RE | Admit: 2012-07-05 | Discharge: 2012-07-09 | DRG: 254 | Disposition: A | Discharge status: Skilled Nursing Facility | Payer: Medicare Other | Source: Ambulatory Visit | Attending: Vascular Surgery | Admitting: Vascular Surgery

## 2012-07-05 ENCOUNTER — Ambulatory Visit (HOSPITAL_COMMUNITY): Payer: Medicare Other

## 2012-07-05 ENCOUNTER — Encounter (HOSPITAL_COMMUNITY)
Admission: RE | Disposition: A | Discharge status: Skilled Nursing Facility | Payer: Self-pay | Source: Ambulatory Visit | Attending: Vascular Surgery

## 2012-07-05 DIAGNOSIS — E785 Hyperlipidemia, unspecified: Secondary | ICD-10-CM | POA: Diagnosis present

## 2012-07-05 DIAGNOSIS — E119 Type 2 diabetes mellitus without complications: Secondary | ICD-10-CM | POA: Diagnosis present

## 2012-07-05 DIAGNOSIS — Z79899 Other long term (current) drug therapy: Secondary | ICD-10-CM | POA: Diagnosis not present

## 2012-07-05 DIAGNOSIS — Z8673 Personal history of transient ischemic attack (TIA), and cerebral infarction without residual deficits: Secondary | ICD-10-CM | POA: Diagnosis not present

## 2012-07-05 DIAGNOSIS — I10 Essential (primary) hypertension: Secondary | ICD-10-CM | POA: Diagnosis not present

## 2012-07-05 DIAGNOSIS — M79609 Pain in unspecified limb: Secondary | ICD-10-CM | POA: Diagnosis not present

## 2012-07-05 DIAGNOSIS — E039 Hypothyroidism, unspecified: Secondary | ICD-10-CM | POA: Diagnosis present

## 2012-07-05 DIAGNOSIS — T827XXA Infection and inflammatory reaction due to other cardiac and vascular devices, implants and grafts, initial encounter: Secondary | ICD-10-CM | POA: Diagnosis not present

## 2012-07-05 DIAGNOSIS — K219 Gastro-esophageal reflux disease without esophagitis: Secondary | ICD-10-CM | POA: Diagnosis present

## 2012-07-05 DIAGNOSIS — I743 Embolism and thrombosis of arteries of the lower extremities: Secondary | ICD-10-CM | POA: Diagnosis not present

## 2012-07-05 DIAGNOSIS — Z794 Long term (current) use of insulin: Secondary | ICD-10-CM

## 2012-07-05 DIAGNOSIS — F17200 Nicotine dependence, unspecified, uncomplicated: Secondary | ICD-10-CM | POA: Diagnosis present

## 2012-07-05 DIAGNOSIS — Z823 Family history of stroke: Secondary | ICD-10-CM | POA: Diagnosis not present

## 2012-07-05 DIAGNOSIS — J4489 Other specified chronic obstructive pulmonary disease: Secondary | ICD-10-CM | POA: Diagnosis not present

## 2012-07-05 DIAGNOSIS — T82898A Other specified complication of vascular prosthetic devices, implants and grafts, initial encounter: Secondary | ICD-10-CM | POA: Diagnosis not present

## 2012-07-05 DIAGNOSIS — K5900 Constipation, unspecified: Secondary | ICD-10-CM | POA: Diagnosis not present

## 2012-07-05 DIAGNOSIS — Z833 Family history of diabetes mellitus: Secondary | ICD-10-CM | POA: Diagnosis not present

## 2012-07-05 DIAGNOSIS — Z7902 Long term (current) use of antithrombotics/antiplatelets: Secondary | ICD-10-CM | POA: Diagnosis not present

## 2012-07-05 DIAGNOSIS — Z48812 Encounter for surgical aftercare following surgery on the circulatory system: Secondary | ICD-10-CM | POA: Diagnosis not present

## 2012-07-05 DIAGNOSIS — T797XXA Traumatic subcutaneous emphysema, initial encounter: Secondary | ICD-10-CM | POA: Diagnosis not present

## 2012-07-05 DIAGNOSIS — I70409 Unspecified atherosclerosis of autologous vein bypass graft(s) of the extremities, unspecified extremity: Secondary | ICD-10-CM | POA: Diagnosis not present

## 2012-07-05 DIAGNOSIS — K2100 Gastro-esophageal reflux disease with esophagitis, without bleeding: Secondary | ICD-10-CM | POA: Diagnosis not present

## 2012-07-05 DIAGNOSIS — Z5189 Encounter for other specified aftercare: Secondary | ICD-10-CM | POA: Diagnosis not present

## 2012-07-05 DIAGNOSIS — IMO0001 Reserved for inherently not codable concepts without codable children: Secondary | ICD-10-CM | POA: Diagnosis not present

## 2012-07-05 DIAGNOSIS — I70219 Atherosclerosis of native arteries of extremities with intermittent claudication, unspecified extremity: Secondary | ICD-10-CM | POA: Diagnosis present

## 2012-07-05 DIAGNOSIS — J449 Chronic obstructive pulmonary disease, unspecified: Secondary | ICD-10-CM | POA: Diagnosis present

## 2012-07-05 HISTORY — PX: FEMORAL-POPLITEAL BYPASS GRAFT: SHX937

## 2012-07-05 LAB — POCT I-STAT 4, (NA,K, GLUC, HGB,HCT)
Glucose, Bld: 162 mg/dL — ABNORMAL HIGH (ref 70–99)
HCT: 37 % — ABNORMAL LOW (ref 39.0–52.0)
Hemoglobin: 12.6 g/dL — ABNORMAL LOW (ref 13.0–17.0)
Potassium: 4.4 mEq/L (ref 3.5–5.1)
Sodium: 145 mEq/L (ref 135–145)

## 2012-07-05 LAB — GLUCOSE, CAPILLARY
Glucose-Capillary: 250 mg/dL — ABNORMAL HIGH (ref 70–99)
Glucose-Capillary: 206 mg/dL — ABNORMAL HIGH (ref 70–99)
Glucose-Capillary: 145 mg/dL — ABNORMAL HIGH (ref 70–99)
Glucose-Capillary: 158 mg/dL — ABNORMAL HIGH (ref 70–99)

## 2012-07-05 LAB — POCT I-STAT GLUCOSE
Glucose, Bld: 203 mg/dL — ABNORMAL HIGH (ref 70–99)
Operator id: 305871

## 2012-07-05 SURGERY — BYPASS GRAFT FEMORAL-POPLITEAL ARTERY
Anesthesia: General | Site: Leg Upper | Laterality: Left | Wound class: Clean

## 2012-07-05 MED ORDER — PHENOL 1.4 % MT LIQD
1.0000 | OROMUCOSAL | Status: DC | PRN
  Administered 2012-07-05: 1 via OROMUCOSAL
  Filled 2012-07-05: qty 177

## 2012-07-05 MED ORDER — HEMOSTATIC AGENTS (NO CHARGE) OPTIME
TOPICAL | Status: DC | PRN
  Administered 2012-07-05: 1 via TOPICAL

## 2012-07-05 MED ORDER — THROMBIN 20000 UNITS EX SOLR
CUTANEOUS | Status: DC | PRN
  Administered 2012-07-05: 20000 [IU] via TOPICAL

## 2012-07-05 MED ORDER — HYDRALAZINE HCL 20 MG/ML IJ SOLN
10.0000 mg | INTRAMUSCULAR | Status: DC | PRN
  Filled 2012-07-05: qty 0.5

## 2012-07-05 MED ORDER — GLYCOPYRROLATE 0.2 MG/ML IJ SOLN
INTRAMUSCULAR | Status: DC | PRN
  Administered 2012-07-05: .8 mg via INTRAVENOUS

## 2012-07-05 MED ORDER — INSULIN ASPART 100 UNIT/ML ~~LOC~~ SOLN
SUBCUTANEOUS | Status: DC | PRN
  Administered 2012-07-05: 5 [IU] via SUBCUTANEOUS

## 2012-07-05 MED ORDER — FUROSEMIDE 20 MG PO TABS
20.0000 mg | ORAL_TABLET | Freq: Every day | ORAL | Status: DC
  Administered 2012-07-06 – 2012-07-09 (×4): 20 mg via ORAL
  Filled 2012-07-05 (×4): qty 1

## 2012-07-05 MED ORDER — SODIUM CHLORIDE 0.9 % IV SOLN
INTRAVENOUS | Status: DC | PRN
  Administered 2012-07-05 (×4): via INTRAVENOUS

## 2012-07-05 MED ORDER — IOHEXOL 300 MG/ML  SOLN
INTRAMUSCULAR | Status: DC | PRN
  Administered 2012-07-05: 20 mL via INTRAVENOUS

## 2012-07-05 MED ORDER — PROTAMINE SULFATE 10 MG/ML IV SOLN
INTRAVENOUS | Status: DC | PRN
  Administered 2012-07-05: 20 mg via INTRAVENOUS
  Administered 2012-07-05 (×2): 10 mg via INTRAVENOUS

## 2012-07-05 MED ORDER — GABAPENTIN 600 MG PO TABS
600.0000 mg | ORAL_TABLET | Freq: Every day | ORAL | Status: DC
  Administered 2012-07-05 – 2012-07-08 (×3): 600 mg via ORAL
  Filled 2012-07-05 (×6): qty 1

## 2012-07-05 MED ORDER — HYDROMORPHONE HCL PF 1 MG/ML IJ SOLN
INTRAMUSCULAR | Status: AC
  Filled 2012-07-05: qty 1

## 2012-07-05 MED ORDER — INSULIN REGULAR HUMAN (CONC) 500 UNIT/ML ~~LOC~~ SOLN
50.0000 [IU] | Freq: Two times a day (BID) | SUBCUTANEOUS | Status: DC
  Administered 2012-07-05: 50 [IU] via SUBCUTANEOUS
  Administered 2012-07-06: 75 [IU] via SUBCUTANEOUS
  Filled 2012-07-05 (×5): qty 20

## 2012-07-05 MED ORDER — PANTOPRAZOLE SODIUM 40 MG PO TBEC
40.0000 mg | DELAYED_RELEASE_TABLET | Freq: Every day | ORAL | Status: DC
  Administered 2012-07-06 – 2012-07-09 (×4): 40 mg via ORAL
  Filled 2012-07-05 (×4): qty 1

## 2012-07-05 MED ORDER — PROPOFOL 10 MG/ML IV EMUL
INTRAVENOUS | Status: DC | PRN
  Administered 2012-07-05: 160 mg via INTRAVENOUS

## 2012-07-05 MED ORDER — HEPARIN SODIUM (PORCINE) 5000 UNIT/ML IJ SOLN
5000.0000 [IU] | Freq: Three times a day (TID) | INTRAMUSCULAR | Status: DC
  Administered 2012-07-05 – 2012-07-09 (×11): 5000 [IU] via SUBCUTANEOUS
  Filled 2012-07-05 (×15): qty 1

## 2012-07-05 MED ORDER — EPHEDRINE SULFATE 50 MG/ML IJ SOLN
INTRAMUSCULAR | Status: DC | PRN
  Administered 2012-07-05: 10 mg via INTRAVENOUS

## 2012-07-05 MED ORDER — HYDROMORPHONE HCL PF 1 MG/ML IJ SOLN
0.2500 mg | INTRAMUSCULAR | Status: DC | PRN
  Administered 2012-07-05: 0.5 mg via INTRAVENOUS

## 2012-07-05 MED ORDER — HEPARIN SODIUM (PORCINE) 1000 UNIT/ML IJ SOLN
INTRAMUSCULAR | Status: DC | PRN
  Administered 2012-07-05: 10000 [IU] via INTRAVENOUS
  Administered 2012-07-05: 5000 [IU] via INTRAVENOUS

## 2012-07-05 MED ORDER — IRBESARTAN 300 MG PO TABS
300.0000 mg | ORAL_TABLET | Freq: Every day | ORAL | Status: DC
  Administered 2012-07-05 – 2012-07-09 (×5): 300 mg via ORAL
  Filled 2012-07-05 (×5): qty 1

## 2012-07-05 MED ORDER — THROMBIN 20000 UNITS EX SOLR
CUTANEOUS | Status: AC
  Filled 2012-07-05: qty 20000

## 2012-07-05 MED ORDER — SIMVASTATIN 40 MG PO TABS: 40.0000 mg | ORAL_TABLET | Freq: Every evening | ORAL | Status: DC

## 2012-07-05 MED ORDER — CEFUROXIME SODIUM 1.5 G IJ SOLR
1.5000 g | Freq: Two times a day (BID) | INTRAMUSCULAR | Status: AC
  Administered 2012-07-05 – 2012-07-06 (×2): 1.5 g via INTRAVENOUS
  Filled 2012-07-05 (×5): qty 1.5

## 2012-07-05 MED ORDER — TIOTROPIUM BROMIDE MONOHYDRATE 18 MCG IN CAPS
18.0000 ug | ORAL_CAPSULE | Freq: Every day | RESPIRATORY_TRACT | Status: DC
  Administered 2012-07-06 – 2012-07-09 (×4): 18 ug via RESPIRATORY_TRACT
  Filled 2012-07-05: qty 5

## 2012-07-05 MED ORDER — BIOTENE DRY MOUTH MT LIQD
15.0000 mL | Freq: Two times a day (BID) | OROMUCOSAL | Status: DC
  Administered 2012-07-05 – 2012-07-08 (×7): 15 mL via OROMUCOSAL

## 2012-07-05 MED ORDER — DOPAMINE-DEXTROSE 3.2-5 MG/ML-% IV SOLN: 3.0000 ug/kg/min | INTRAVENOUS | Status: DC

## 2012-07-05 MED ORDER — SODIUM CHLORIDE 0.9 % IV SOLN
INTRAVENOUS | Status: DC
  Administered 2012-07-05 – 2012-07-06 (×3): via INTRAVENOUS

## 2012-07-05 MED ORDER — EPHEDRINE SULFATE 50 MG/ML IJ SOLN: INTRAMUSCULAR | Status: DC | PRN

## 2012-07-05 MED ORDER — DOCUSATE SODIUM 100 MG PO CAPS
100.0000 mg | ORAL_CAPSULE | Freq: Every day | ORAL | Status: DC
  Administered 2012-07-06 – 2012-07-09 (×4): 100 mg via ORAL
  Filled 2012-07-05 (×3): qty 1

## 2012-07-05 MED ORDER — FENTANYL CITRATE 0.05 MG/ML IJ SOLN
INTRAMUSCULAR | Status: DC | PRN
  Administered 2012-07-05 (×2): 50 ug via INTRAVENOUS
  Administered 2012-07-05: 100 ug via INTRAVENOUS
  Administered 2012-07-05 (×3): 50 ug via INTRAVENOUS
  Administered 2012-07-05: 25 ug via INTRAVENOUS
  Administered 2012-07-05: 50 ug via INTRAVENOUS

## 2012-07-05 MED ORDER — IPRATROPIUM-ALBUTEROL 18-103 MCG/ACT IN AERO
INHALATION_SPRAY | RESPIRATORY_TRACT | Status: DC | PRN
  Administered 2012-07-05 (×2): 2 via RESPIRATORY_TRACT

## 2012-07-05 MED ORDER — ONDANSETRON HCL 4 MG/2ML IJ SOLN: 4.0000 mg | Freq: Once | INTRAMUSCULAR | Status: DC | PRN

## 2012-07-05 MED ORDER — MORPHINE SULFATE 2 MG/ML IJ SOLN
INTRAMUSCULAR | Status: AC
  Administered 2012-07-05: 2 mg via INTRAVENOUS
  Filled 2012-07-05: qty 1

## 2012-07-05 MED ORDER — NEBIVOLOL HCL 5 MG PO TABS
5.0000 mg | ORAL_TABLET | Freq: Every day | ORAL | Status: DC
  Administered 2012-07-06 – 2012-07-09 (×4): 5 mg via ORAL
  Filled 2012-07-05 (×4): qty 1

## 2012-07-05 MED ORDER — SUCCINYLCHOLINE CHLORIDE 20 MG/ML IJ SOLN
INTRAMUSCULAR | Status: DC | PRN
  Administered 2012-07-05: 100 mg via INTRAVENOUS

## 2012-07-05 MED ORDER — ONDANSETRON HCL 4 MG/2ML IJ SOLN: 4.0000 mg | Freq: Four times a day (QID) | INTRAMUSCULAR | Status: DC | PRN

## 2012-07-05 MED ORDER — LABETALOL HCL 5 MG/ML IV SOLN
10.0000 mg | INTRAVENOUS | Status: DC | PRN
  Filled 2012-07-05 (×2): qty 4

## 2012-07-05 MED ORDER — POTASSIUM CHLORIDE CRYS ER 20 MEQ PO TBCR: 20.0000 meq | EXTENDED_RELEASE_TABLET | Freq: Once | ORAL | Status: AC | PRN

## 2012-07-05 MED ORDER — LEVOTHYROXINE SODIUM 137 MCG PO TABS
137.0000 ug | ORAL_TABLET | Freq: Every day | ORAL | Status: DC
  Administered 2012-07-06 – 2012-07-09 (×4): 137 ug via ORAL
  Filled 2012-07-05 (×7): qty 1

## 2012-07-05 MED ORDER — ACETAMINOPHEN 10 MG/ML IV SOLN: 1000.0000 mg | Freq: Once | INTRAVENOUS | Status: DC | PRN

## 2012-07-05 MED ORDER — INSULIN ASPART 100 UNIT/ML ~~LOC~~ SOLN
SUBCUTANEOUS | Status: AC
  Filled 2012-07-05: qty 5

## 2012-07-05 MED ORDER — FLEET ENEMA 7-19 GM/118ML RE ENEM: 1.0000 | ENEMA | Freq: Once | RECTAL | Status: AC | PRN

## 2012-07-05 MED ORDER — ROCURONIUM BROMIDE 100 MG/10ML IV SOLN
INTRAVENOUS | Status: DC | PRN
  Administered 2012-07-05: 10 mg via INTRAVENOUS
  Administered 2012-07-05: 50 mg via INTRAVENOUS
  Administered 2012-07-05: 20 mg via INTRAVENOUS
  Administered 2012-07-05: 10 mg via INTRAVENOUS
  Administered 2012-07-05: 30 mg via INTRAVENOUS

## 2012-07-05 MED ORDER — PHENYLEPHRINE HCL 10 MG/ML IJ SOLN
10.0000 mg | INTRAVENOUS | Status: DC | PRN
  Administered 2012-07-05: 10 ug/min via INTRAVENOUS

## 2012-07-05 MED ORDER — AMLODIPINE BESYLATE 10 MG PO TABS
10.0000 mg | ORAL_TABLET | Freq: Every day | ORAL | Status: DC
  Administered 2012-07-06 – 2012-07-09 (×4): 10 mg via ORAL
  Filled 2012-07-05 (×4): qty 1

## 2012-07-05 MED ORDER — METFORMIN HCL ER 500 MG PO TB24
1000.0000 mg | ORAL_TABLET | Freq: Two times a day (BID) | ORAL | Status: DC
  Administered 2012-07-07 – 2012-07-09 (×4): 1000 mg via ORAL
  Filled 2012-07-05 (×6): qty 2

## 2012-07-05 MED ORDER — CLOPIDOGREL BISULFATE 75 MG PO TABS
75.0000 mg | ORAL_TABLET | Freq: Every day | ORAL | Status: DC
  Administered 2012-07-06 – 2012-07-09 (×4): 75 mg via ORAL
  Filled 2012-07-05 (×4): qty 1

## 2012-07-05 MED ORDER — MORPHINE SULFATE 2 MG/ML IJ SOLN
2.0000 mg | INTRAMUSCULAR | Status: DC | PRN
  Administered 2012-07-05: 2 mg via INTRAVENOUS
  Administered 2012-07-05: 4 mg via INTRAVENOUS
  Administered 2012-07-05: 2 mg via INTRAVENOUS
  Administered 2012-07-05 – 2012-07-06 (×10): 4 mg via INTRAVENOUS
  Administered 2012-07-07: 2 mg via INTRAVENOUS
  Administered 2012-07-07 (×2): 4 mg via INTRAVENOUS
  Administered 2012-07-07 – 2012-07-08 (×2): 2 mg via INTRAVENOUS
  Administered 2012-07-08 (×2): 4 mg via INTRAVENOUS
  Administered 2012-07-08: 2 mg via INTRAVENOUS
  Administered 2012-07-08: 4 mg via INTRAVENOUS
  Filled 2012-07-05 (×2): qty 2
  Filled 2012-07-05: qty 1
  Filled 2012-07-05 (×6): qty 2
  Filled 2012-07-05: qty 1
  Filled 2012-07-05 (×5): qty 2
  Filled 2012-07-05: qty 1
  Filled 2012-07-05: qty 2
  Filled 2012-07-05: qty 1
  Filled 2012-07-05 (×2): qty 2
  Filled 2012-07-05: qty 1

## 2012-07-05 MED ORDER — SODIUM CHLORIDE 0.9 % IR SOLN
Status: DC | PRN
  Administered 2012-07-05: 10:00:00

## 2012-07-05 MED ORDER — FLUTICASONE-SALMETEROL 250-50 MCG/DOSE IN AEPB
1.0000 | INHALATION_SPRAY | Freq: Two times a day (BID) | RESPIRATORY_TRACT | Status: DC
  Administered 2012-07-05 – 2012-07-09 (×8): 1 via RESPIRATORY_TRACT
  Filled 2012-07-05: qty 14

## 2012-07-05 MED ORDER — SENNOSIDES-DOCUSATE SODIUM 8.6-50 MG PO TABS
1.0000 | ORAL_TABLET | Freq: Every evening | ORAL | Status: DC | PRN
  Filled 2012-07-05: qty 1

## 2012-07-05 MED ORDER — ALBUMIN HUMAN 5 % IV SOLN
INTRAVENOUS | Status: DC | PRN
  Administered 2012-07-05 (×2): via INTRAVENOUS

## 2012-07-05 MED ORDER — SODIUM CHLORIDE 0.9 % IV SOLN: 500.0000 mL | Freq: Once | INTRAVENOUS | Status: AC | PRN

## 2012-07-05 MED ORDER — ATORVASTATIN CALCIUM 20 MG PO TABS
20.0000 mg | ORAL_TABLET | Freq: Every day | ORAL | Status: DC
  Administered 2012-07-05 – 2012-07-08 (×4): 20 mg via ORAL
  Filled 2012-07-05 (×6): qty 1

## 2012-07-05 MED ORDER — GUAIFENESIN-DM 100-10 MG/5ML PO SYRP: 15.0000 mL | ORAL_SOLUTION | ORAL | Status: DC | PRN

## 2012-07-05 MED ORDER — ACETAMINOPHEN 325 MG PO TABS: 325.0000 mg | ORAL_TABLET | ORAL | Status: DC | PRN

## 2012-07-05 MED ORDER — LIDOCAINE HCL (CARDIAC) 20 MG/ML IV SOLN
INTRAVENOUS | Status: DC | PRN
  Administered 2012-07-05: 80 mg via INTRAVENOUS

## 2012-07-05 MED ORDER — INSULIN REGULAR HUMAN (CONC) 500 UNIT/ML ~~LOC~~ SOLN: 50.0000 [IU] | Freq: Two times a day (BID) | SUBCUTANEOUS | Status: DC

## 2012-07-05 MED ORDER — 0.9 % SODIUM CHLORIDE (POUR BTL) OPTIME
TOPICAL | Status: DC | PRN
  Administered 2012-07-05: 1000 mL

## 2012-07-05 MED ORDER — INSULIN REGULAR HUMAN (CONC) 500 UNIT/ML ~~LOC~~ SOLN
10.0000 [IU] | Freq: Two times a day (BID) | SUBCUTANEOUS | Status: DC
  Filled 2012-07-05: qty 20

## 2012-07-05 MED ORDER — NEOSTIGMINE METHYLSULFATE 1 MG/ML IJ SOLN
INTRAMUSCULAR | Status: DC | PRN
  Administered 2012-07-05: 5 mg via INTRAVENOUS

## 2012-07-05 MED ORDER — METOPROLOL TARTRATE 1 MG/ML IV SOLN: 2.0000 mg | INTRAVENOUS | Status: DC | PRN

## 2012-07-05 MED ORDER — OXYCODONE HCL 5 MG PO TABS: 5.0000 mg | ORAL_TABLET | ORAL | Status: DC | PRN

## 2012-07-05 MED ORDER — IOHEXOL 300 MG/ML  SOLN
INTRAMUSCULAR | Status: AC
  Filled 2012-07-05: qty 1

## 2012-07-05 MED ORDER — BISACODYL 5 MG PO TBEC: 5.0000 mg | DELAYED_RELEASE_TABLET | Freq: Every day | ORAL | Status: DC | PRN

## 2012-07-05 MED ORDER — ACETAMINOPHEN 650 MG RE SUPP: 325.0000 mg | RECTAL | Status: DC | PRN

## 2012-07-05 MED ORDER — MAGNESIUM SULFATE 40 MG/ML IJ SOLN
2.0000 g | Freq: Once | INTRAMUSCULAR | Status: AC | PRN
  Filled 2012-07-05: qty 50

## 2012-07-05 MED ORDER — MIDAZOLAM HCL 5 MG/5ML IJ SOLN
INTRAMUSCULAR | Status: DC | PRN
  Administered 2012-07-05: 2 mg via INTRAVENOUS

## 2012-07-05 MED ORDER — ALUM & MAG HYDROXIDE-SIMETH 200-200-20 MG/5ML PO SUSP: 15.0000 mL | ORAL | Status: DC | PRN

## 2012-07-05 SURGICAL SUPPLY — 67 items
ADH SKN CLS LQ APL DERMABOND (GAUZE/BANDAGES/DRESSINGS) ×1
BANDAGE ELASTIC 4 VELCRO ST LF (GAUZE/BANDAGES/DRESSINGS) IMPLANT
BANDAGE ESMARK 6X9 LF (GAUZE/BANDAGES/DRESSINGS) IMPLANT
BNDG CMPR 9X6 STRL LF SNTH (GAUZE/BANDAGES/DRESSINGS) ×1
BNDG ESMARK 6X9 LF (GAUZE/BANDAGES/DRESSINGS) ×2
CANISTER SUCTION 2500CC (MISCELLANEOUS) ×2 IMPLANT
CANNULA VESSEL W/WING W/VALVE (CANNULA) ×2 IMPLANT
CLIP TI MEDIUM 24 (CLIP) ×2 IMPLANT
CLIP TI WIDE RED SMALL 24 (CLIP) ×3 IMPLANT
CLOTH BEACON ORANGE TIMEOUT ST (SAFETY) ×2 IMPLANT
COVER SURGICAL LIGHT HANDLE (MISCELLANEOUS) ×2 IMPLANT
CUFF TOURNIQUET SINGLE 24IN (TOURNIQUET CUFF) IMPLANT
CUFF TOURNIQUET SINGLE 34IN LL (TOURNIQUET CUFF) ×1 IMPLANT
CUFF TOURNIQUET SINGLE 44IN (TOURNIQUET CUFF) IMPLANT
DERMABOND ADHESIVE PROPEN (GAUZE/BANDAGES/DRESSINGS) ×1
DERMABOND ADVANCED .7 DNX6 (GAUZE/BANDAGES/DRESSINGS) IMPLANT
DRAIN CHANNEL 15F RND FF W/TCR (WOUND CARE) ×2 IMPLANT
DRAIN PENROSE 3/4X12 (DRAIN) IMPLANT
DRAPE WARM FLUID 44X44 (DRAPE) ×2 IMPLANT
DRAPE X-RAY CASS 24X20 (DRAPES) ×2 IMPLANT
DRSG COVADERM 4X10 (GAUZE/BANDAGES/DRESSINGS) IMPLANT
DRSG COVADERM 4X8 (GAUZE/BANDAGES/DRESSINGS) ×1 IMPLANT
ELECT BLADE 4.0 EZ CLEAN MEGAD (MISCELLANEOUS) ×2
ELECT REM PT RETURN 9FT ADLT (ELECTROSURGICAL) ×4
ELECTRODE BLDE 4.0 EZ CLN MEGD (MISCELLANEOUS) IMPLANT
ELECTRODE REM PT RTRN 9FT ADLT (ELECTROSURGICAL) ×1 IMPLANT
EVACUATOR SILICONE 100CC (DRAIN) ×2 IMPLANT
GAUZE SPONGE 4X4 16PLY XRAY LF (GAUZE/BANDAGES/DRESSINGS) ×2 IMPLANT
GLOVE BIO SURGEON STRL SZ 6.5 (GLOVE) ×1 IMPLANT
GLOVE BIO SURGEON STRL SZ7.5 (GLOVE) ×5 IMPLANT
GLOVE BIOGEL PI IND STRL 6.5 (GLOVE) IMPLANT
GLOVE BIOGEL PI IND STRL 7.0 (GLOVE) IMPLANT
GLOVE BIOGEL PI IND STRL 7.5 (GLOVE) ×1 IMPLANT
GLOVE BIOGEL PI INDICATOR 6.5 (GLOVE) ×4
GLOVE BIOGEL PI INDICATOR 7.0 (GLOVE) ×1
GLOVE BIOGEL PI INDICATOR 7.5 (GLOVE) ×4
GOWN PREVENTION PLUS XLARGE (GOWN DISPOSABLE) ×4 IMPLANT
GOWN STRL NON-REIN LRG LVL3 (GOWN DISPOSABLE) ×4 IMPLANT
KIT BASIN OR (CUSTOM PROCEDURE TRAY) ×2 IMPLANT
KIT ROOM TURNOVER OR (KITS) ×2 IMPLANT
MARKER GRAFT CORONARY BYPASS (MISCELLANEOUS) IMPLANT
NS IRRIG 1000ML POUR BTL (IV SOLUTION) ×4 IMPLANT
PACK PERIPHERAL VASCULAR (CUSTOM PROCEDURE TRAY) ×2 IMPLANT
PAD ARMBOARD 7.5X6 YLW CONV (MISCELLANEOUS) ×6 IMPLANT
PADDING CAST COTTON 6X4 STRL (CAST SUPPLIES) ×1 IMPLANT
PENCIL BUTTON HOLSTER BLD 10FT (ELECTRODE) ×1 IMPLANT
SET COLLECT BLD 21X3/4 12 (NEEDLE) ×1 IMPLANT
SPONGE SURGIFOAM ABS GEL 100 (HEMOSTASIS) ×1 IMPLANT
STAPLER VISISTAT 35W (STAPLE) ×1 IMPLANT
STOPCOCK 4 WAY LG BORE MALE ST (IV SETS) ×1 IMPLANT
SUT ETHILON 3 0 PS 1 (SUTURE) ×2 IMPLANT
SUT PROLENE 5 0 C 1 24 (SUTURE) ×2 IMPLANT
SUT PROLENE 6 0 BV (SUTURE) ×6 IMPLANT
SUT PROLENE 7 0 BV 1 (SUTURE) IMPLANT
SUT SILK 3 0 (SUTURE)
SUT SILK 3-0 18XBRD TIE 12 (SUTURE) IMPLANT
SUT VIC AB 2-0 CTB1 (SUTURE) ×6 IMPLANT
SUT VIC AB 3-0 SH 27 (SUTURE) ×8
SUT VIC AB 3-0 SH 27X BRD (SUTURE) ×2 IMPLANT
SUT VICRYL 4-0 PS2 18IN ABS (SUTURE) ×5 IMPLANT
TAPE UMBILICAL COTTON 1/8X30 (MISCELLANEOUS) ×1 IMPLANT
TOWEL OR 17X24 6PK STRL BLUE (TOWEL DISPOSABLE) ×4 IMPLANT
TOWEL OR 17X26 10 PK STRL BLUE (TOWEL DISPOSABLE) ×5 IMPLANT
TRAY FOLEY CATH 14FRSI W/METER (CATHETERS) ×2 IMPLANT
TUBING EXTENTION W/L.L. (IV SETS) ×1 IMPLANT
UNDERPAD 30X30 INCONTINENT (UNDERPADS AND DIAPERS) ×2 IMPLANT
WATER STERILE IRR 1000ML POUR (IV SOLUTION) ×2 IMPLANT

## 2012-07-05 NOTE — Progress Notes (Signed)
Patient brought his own insulin (u 500). His insulin is with his Mother-in-law, Nelson Chimes, here waiting.

## 2012-07-05 NOTE — Op Note (Signed)
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 Revision of left femoral to below knee popliteal artery bypass 2. Vein patch angioplasty of vein graft stenosis in the above-knee segment of femoropopliteal bypass graft using segment of left greater saphenous vein 3. Extension of femoropopliteal bypass graft to tibial peroneal trunk using reversed right greater saphenous vein 4. Intraoperative arteriogram times one  SURGEON: Di Kindle. Edilia Bo, MD, FACS  ASSIST: Della Goo PA  ANESTHESIA: Gen.   EBL: 100 cc  INDICATIONS: Sean Saunders is a 66 y.o. male who has undergone previous aortobifemoral bypass grafting. In addition he had a left femoral to below knee pop bypass with a vein graft. The vein graft was fairly small and there was an area of significantly increased velocities in the graft behind the knee. Given the risk for graft thrombosis revision was recommended.  FINDINGS: significant narrowing of the vein graft behind the knee as noted on duplex  TECHNIQUE: The patient was brought to the operative room and received a general anesthetic. Both lower extremities were prepped and draped in usual sterile fashion. An above-the-knee incision was made along the medial aspect of the left thigh and through this incision the vein graft was dissected free. I dissected fairly low to the level of the patella and interrogated the graft with the duplex scanner. There was clearly an area of increased velocities noted in this distal segment and therefore I felt that I might potentially be able to patch this area and not bypassed around it. A segment of saphenous vein was harvested from the left leg through longitudinal incisions and opened longitudinally. A tourniquet was placed on the upper thigh. The leg was exsanguinated with an Esmarch bandage and  the tourniquet inflated to 300 mm mercury. Under tourniquet control, a longitudinal graftotomy was made through the area of concern. There was some intimal hyperplasia present here. The vein patch was then sewn using continuous 6-0 Prolene suture. Of note prior to completing anastomosis I was able to pass a 2-1/2 mm dilator distally without problems but was unable to pass a 3 mm dilator suggesting possible obstruction distally. Anastomosis was completed. A completion arteriogram  Was obtained which showed an area of narrowing in the vein graft further distally behind the knee. This likely represented the initial area of concern based on the duplex findings and the arteriogram findings. I therefore felt that really no option except to bypass around this area. A longitudinal incision was made in the proximal medial aspect of the right thigh and a segment of greater saphenous vein was harvested from the groin to the mid thigh. Branches were divided between clips and 3-0 silk ties. The vein was irrigated with heparinized saline. Next a separate longitudinal incision was made to expose the bypass graft below the knee. Through dense scar tissue the below knee popliteal vein was controlled to be retracted medially allowing exposure of the distal anastomosis from the previous bypass graft. This was very high under the knee and therefore elected to jump a new segment of bypass lower down below the anastomosis. I dissected out the below-knee popliteal artery and proximal tibioperoneal trunk. A tunnel was then created between the 2 incisions in the left leg and the patient was reheparinized. I elected to use the vein in a reverse fashion.   The area of the  vein graft above the knee which had been patched was controlled proximally and distally with vessel loops and opened longitudinally. The vein graft was spatulated and using a reverse fashion. It was sewn end to side to the above-knee segment of the bypass graft using  continuous 6-0 Prolene suture. The vein graft was then distended and marked prevent twisting and brought through the previously created tunnel and clamped. The leg was then exsanguinated with an Esmarch bandage and the tourniquet inflated to 300 mm of mercury. A longitudinal arteriotomy was made in the below-knee popliteal artery extending onto the tibial peroneal trunk. The vein graft was cut to appropriate length spatulated and sewn end to side to the below-knee popliteal artery and tibial peroneal trunk using continuous 6-0 Prolene suture. Prior to completing this anastomosis the artery was backbled and flushed appropriately and the anastomosis completed. Flow was reestablished to the left leg and there was a good posterior tibial signal with the Doppler at the completion. Next heparin was partially reversed with protamine. The right thigh incision was closed with a deep layer of 2-0 Vicryl. The skin was closed with 4-0 subcuticular stitch. The above and below knee incisions were closed with deep layer of 2-0 Vicryl. The subcutaneous layer was closed with 3-0 Vicryl. Skin was closed with 4-0 subcuticular stitch. I did place a 15 Blake drain in the above-knee incision in the below-knee incision. The vein harvest site in the distal left leg was closed in layers 3-0 Vicryl the skin closed with 4-0 Vicryl. Dermabond was applied. The patient tolerated the procedure well and was transferred to the recovery room in stable condition. All needle and sponge counts were correct.   Waverly Ferrari, MD, FACS [facility]  DATE OF DICTATION:   07/05/2012

## 2012-07-05 NOTE — Transfer of Care (Signed)
Immediate Anesthesia Transfer of Care Note  Patient: Sean Saunders  Procedure(s) Performed: Procedure(s) (LRB): BYPASS GRAFT FEMORAL-POPLITEAL ARTERY (Left)  Patient Location: PACU  Anesthesia Type: General  Level of Consciousness: sedated  Airway & Oxygen Therapy: Patient Spontanous Breathing  Post-op Assessment: Report given to PACU RN  Post vital signs: stable  Complications: No apparent anesthesia complications

## 2012-07-05 NOTE — H&P (View-Only) (Signed)
[facility]  Patient name: Sean Saunders MRN: [id number] DOB: 08-12-1946 Sex: male  CC: vein graft stenosis of left femoropopliteal bypass graft  HPI: Sean Saunders is a 66 y.o. male who had an aortobifemoral bypass graft in August of 2006. In a August of 2012 he had a left femoral to below knee popliteal artery bypass graft with a non-her saphenous vein graft. A routine follow duplex he was noted to have an area of increased velocities within his vein graft behind the knee. Peak systolic velocity in this area was 586 cm/s suggesting a severe stenosis. He underwent arteriogram which confirmed a stenosis in this area although it did not look quite as bad as the velocities suggest. However I think the arteriogram oftentimes underestimates the severity of the stenosis then certainly based on the velocities the stenosis puts the graft at risk for thrombosis. The patient describes some mild claudication symptoms in the left but not on the right. He's had no rest pain no history nonhealing ulcers. He does have a history of neuropathy.  Past Medical History  Diagnosis Date  . Hypertension   . Diabetes mellitus   . COPD (chronic obstructive pulmonary disease)   . Hyperlipidemia   . Thyroid disease   . TIA (transient ischemic attack) 08/03/2011    Left sided weakness. 3 episodes Feb-March    Family History  Problem Relation Age of Onset  . Stroke Mother   . Diabetes Mother   . Other Father     alzheimers  . Diabetes Father   . Stroke Father   . Diabetes Sister     SOCIAL HISTORY: History  Substance Use Topics  . Smoking status: Current Everyday Smoker -- 0.5 packs/day for 5 years    Types: Cigarettes  . Smokeless tobacco: Never Used  . Alcohol Use: Yes     rare    Allergies  Allergen Reactions  . Eszopiclone   . Niaspan (Niacin Er)     Current Outpatient Prescriptions  Medication Sig Dispense Refill  . amLODipine (NORVASC) 10 MG tablet Take 10  mg by mouth daily.      . clopidogrel (PLAVIX) 75 MG tablet Take 75 mg by mouth daily.        . Fluticasone-Salmeterol (ADVAIR) 250-50 MCG/DOSE AEPB Inhale 1 puff into the lungs every 12 (twelve) hours.        . furosemide (LASIX) 20 MG tablet Take 20 mg by mouth daily.       Marland Kitchen gabapentin (NEURONTIN) 600 MG tablet Take 600 mg by mouth at bedtime.        . insulin regular human CONCENTRATED (HUMULIN R) 500 UNIT/ML SOLN injection Inject 10-35 Units into the skin 2 (two) times daily. Take 35 units in the morning and 10 units in the evening      . levothyroxine (SYNTHROID, LEVOTHROID) 137 MCG tablet Take 137 mcg by mouth daily.        . metFORMIN (GLUCOPHAGE XR) 500 MG 24 hr tablet Take 1,000 mg by mouth 2 (two) times daily.      . nebivolol (BYSTOLIC) 5 MG tablet Take 5 mg by mouth daily.      Marland Kitchen omeprazole (PRILOSEC) 20 MG capsule Take 20 mg by mouth 2 (two) times daily.      . rosuvastatin (CRESTOR) 10 MG tablet Take 10 mg by mouth daily.      . simvastatin (ZOCOR) 40 MG tablet Take 40 mg by mouth every evening.      Marland Kitchen  sitaGLIPtin (JANUVIA) 100 MG tablet Take 100 mg by mouth daily.        Marland Kitchen tiotropium (SPIRIVA) 18 MCG inhalation capsule Place 18 mcg into inhaler and inhale daily.        . valsartan (DIOVAN) 320 MG tablet Take 320 mg by mouth daily.        REVIEW OF SYSTEMS: Arly.Keller ] denotes positive finding; [  ] denotes negative finding CARDIOVASCULAR:  [ ]  chest pain   [ ]  chest pressure   [ ]  palpitations   [ ]  orthopnea   Arly.Keller ] dyspnea on exertion   [ ]  claudication   [ ]  rest pain   [ ]  DVT   [ ]  phlebitis PULMONARY:   [ ]  productive cough   [ ]  asthma   [ ]  wheezing NEUROLOGIC:   [ ]  weakness  [ ]  paresthesias  [ ]  aphasia  [ ]  amaurosis  [ ]  dizziness HEMATOLOGIC:   [ ]  bleeding problems   [ ]  clotting disorders MUSCULOSKELETAL:  [ ]  joint pain   [ ]  joint swelling [ ]  leg swelling GASTROINTESTINAL: [ ]   blood in stool  [ ]   hematemesis GENITOURINARY:  [ ]   dysuria  [ ]    hematuria PSYCHIATRIC:  [ ]  history of major depression INTEGUMENTARY:  [ ]  rashes  [ ]  ulcers CONSTITUTIONAL:  [ ]  fever   [ ]  chills  PHYSICAL EXAM: Filed Vitals:   06/27/12 1150  BP: 121/93  Pulse: 58  Temp: 98.2 F (36.8 C)  TempSrc: Oral  Height: 6' (1.829 m)  Weight: 237 lb (107.502 kg)  SpO2: 99%   Body mass index is 32.14 kg/(m^2). GENERAL: The patient is a well-nourished male, in no acute distress. The vital signs are documented above. CARDIOVASCULAR: There is a regular rate and rhythm without significant murmur appreciated. He has palpable femoral pulses. Cannot palpate popliteal or pedal pulses on the left. Both feet are warm and well-perfused. PULMONARY: There is good air exchange bilaterally without wheezing or rales. ABDOMEN: Soft and non-tender with normal pitched bowel sounds.  MUSCULOSKELETAL: There are no major deformities or cyanosis. NEUROLOGIC: No focal weakness or paresthesias are detected. SKIN: There are no ulcers or rashes noted. PSYCHIATRIC: The patient has a normal affect.  DATA:  I reviewed his chart. Gram which shows a stenosis in the vein graft at the level of the knee.  MEDICAL ISSUES: Given the severity of the stenosis in the femoropopliteal bypass graft on the left based on the duplex findings, I think the graft is at significant risk for thrombosis. Therefore I have recommended revision of his bypass graft. I think this will involve bypassing from the vein grafts above-the-knee to the vein graft below the knee using saphenous vein graft. Vein was somewhat small in the left side and I suspect we'll have to take vein from the right thigh in order to have an adequate size vein for bypass. I have reviewed the indications for lower extremity bypass. I have also reviewed the potential complications of surgery including but not limited to: wound healing problems, infection, graft thrombosis, limb loss, or other unpredictable medical problems. All the  patient's questions were answered and they are agreeable to proceed. The surgery has been scheduled for 07/05/2012.   DICKSON,CHRISTOPHER S [facility] Office: [phone]

## 2012-07-05 NOTE — Progress Notes (Addendum)
Pharmacy Home Medication Reconciliation Communication High Risk Medication: Humulin U-500 Insulin  Home dose of Humulin U-500 insulin was reordered for this patient. The dose was verified with the patient as listed below:  Type of syringe used at home:  U-100 Number or line on syringe to which patient draws up insulin: 10-30 units  This equates to 50-150 units of U-500 insulin  The patient utilizes a home sliding scale and administers his U-500 insulin with breakfast and dinner. His home sliding scale is as follows:  CBG >300: 30 units on U-100 syringe (correlates to 150 units of U-500) CBG 251-300: 25 units on U-100 syringe (correlates to 125 units of U-500) CBG 201-250: 20 units on U-100 syringe (correlates to 100 units of U-500) CBG 151-200: 15 units on U-100 syringe (correlates to 75 units of U-500) CBG 101-150: 10 units on U-100 syringe (corrleates to 50 units of U-500) CBG </= 100: Hold dose  His home medications is stored in the 2nd floor satellite. When his CBG is checked prior to breakfast and supper, the floor nurse will inform pharmacy of the CBG result and come and pick up the appropriate dose of U-500 from the satellite (pharmacy will draw it up prior to dispensing)  Georgina Pillion, PharmD, BCPS Clinical Pharmacist Pager: [phone] 07/05/2012 4:27 PM

## 2012-07-05 NOTE — Progress Notes (Signed)
UR Completed. Jaretssi Kraker, RN, Nurse Case Manager [phone]     

## 2012-07-05 NOTE — Interval H&P Note (Signed)
History and Physical Interval Note:  07/05/2012 7:23 AM  Sean Saunders  has presented today for surgery, with the diagnosis of VEIN GRAFT STENOSIS LEFT LE  The various methods of treatment have been discussed with the patient and family. After consideration of risks, benefits and other options for treatment, the patient has consented to: REVISION OF  LEFT FEMORAL-POPLITEAL BYPASS GRAFT ARTERY POSSIBLY USING VEIN FROM RIGHT LEG. The patient's history has been reviewed, patient examined, no change in status, stable for surgery.  I have reviewed the patient's chart and labs.  Questions were answered to the patient's satisfaction.     Shataria Crist S

## 2012-07-05 NOTE — Anesthesia Postprocedure Evaluation (Signed)
  Anesthesia Post-op Note  Patient: Sean Saunders  Procedure(s) Performed: Procedure(s) (LRB): BYPASS GRAFT FEMORAL-POPLITEAL ARTERY (Left)  Patient Location: PACU  Anesthesia Type: General  Level of Consciousness: awake, alert , oriented and patient cooperative  Airway and Oxygen Therapy: Patient Spontanous Breathing and Patient connected to nasal cannula oxygen  Post-op Pain: none  Post-op Assessment: Post-op Vital signs reviewed, Patient's Cardiovascular Status Stable and Respiratory Function Stable  Post-op Vital Signs: Reviewed  Complications: No apparent anesthesia complications

## 2012-07-05 NOTE — Transfer of Care (Signed)
Immediate Anesthesia Transfer of Care Note  Patient: Sean Saunders  Procedure(s) Performed: Procedure(s) (LRB): BYPASS GRAFT FEMORAL-POPLITEAL ARTERY (Left)  Patient Location: PACU  Anesthesia Type: General  Level of Consciousness: awake  Airway & Oxygen Therapy: Patient Spontanous Breathing  Post-op Assessment: Report given to PACU RN  Post vital signs: stable  Complications: No apparent anesthesia complications

## 2012-07-05 NOTE — Anesthesia Preprocedure Evaluation (Addendum)
Anesthesia Evaluation  Patient identified by MRN, date of birth, ID band Patient awake    Reviewed: Allergy & Precautions, H&P , NPO status , Patient's Chart, lab work & pertinent test results, reviewed documented beta blocker date and time   Airway Mallampati: II TM Distance: <3 FB     Dental  (+) Partial Upper and Poor Dentition   Pulmonary COPD COPD inhaler,  breath sounds clear to auscultation        Cardiovascular hypertension, Pt. on medications Rhythm:Regular Rate:Normal     Neuro/Psych TIA Neuromuscular disease    GI/Hepatic GERD-  Controlled,  Endo/Other  Well Controlled, Type 1, Insulin DependentHypothyroidism   Renal/GU      Musculoskeletal   Abdominal (+)  Abdomen: soft. Bowel sounds: normal.  Peds  Hematology   Anesthesia Other Findings   Reproductive/Obstetrics                         Anesthesia Physical Anesthesia Plan  ASA: III  Anesthesia Plan: General   Post-op Pain Management:    Induction: Intravenous  Airway Management Planned: Oral ETT  Additional Equipment:   Intra-op Plan:   Post-operative Plan: Extubation in OR  Informed Consent: I have reviewed the patients History and Physical, chart, labs and discussed the procedure including the risks, benefits and alternatives for the proposed anesthesia with the patient or authorized representative who has indicated his/her understanding and acceptance.   Dental advisory given  Plan Discussed with: CRNA and Surgeon  Anesthesia Plan Comments:         Anesthesia Quick Evaluation

## 2012-07-05 NOTE — Progress Notes (Signed)
VVS POST-OP Note:  Pt doing well VSS SBP 190's on a-line ; 160 by cuff bilat. A-line has whip so will go by cuff pressure  Pulses Right PT  - triphasic and palpable  LLE _ monophasic PT> peroneal NO DP or AT  Both feet are equally cool  Overall doing well DM coord/pharm to help with dosing home Insulin Will hold metformin for now Keep both feet warm

## 2012-07-05 NOTE — Anesthesia Procedure Notes (Addendum)
Procedure Name: Intubation Date/Time: 07/05/2012 7:51 AM Performed by: Ellin Goodie Pre-anesthesia Checklist: Patient identified, Emergency Drugs available, Suction available, Patient being monitored and Timeout performed Patient Re-evaluated:Patient Re-evaluated prior to inductionOxygen Delivery Method: Circle system utilized Preoxygenation: Pre-oxygenation with 100% oxygen Intubation Type: IV induction Ventilation: Mask ventilation without difficulty and Oral airway inserted - appropriate to patient size Laryngoscope Size: Mac and 4 Grade View: Grade I Tube type: Oral Tube size: 7.5 mm Number of attempts: 1 Airway Equipment and Method: Stylet Placement Confirmation: ETT inserted through vocal cords under direct vision,  positive ETCO2 and breath sounds checked- equal and bilateral Secured at: 23 cm Tube secured with: Tape Dental Injury: Teeth and Oropharynx as per pre-operative assessment    Performed by: WEAVER, Niti Leisure M Intubation Type: Rapid sequence and Cricoid Pressure applied

## 2012-07-05 NOTE — Anesthesia Postprocedure Evaluation (Signed)
  Anesthesia Post-op Note  Patient: Sean Saunders  Procedure(s) Performed: Procedure(s) (LRB): BYPASS GRAFT FEMORAL-POPLITEAL ARTERY (Left)  Patient Location: PACU  Anesthesia Type: General  Level of Consciousness: awake, alert  and oriented  Airway and Oxygen Therapy: Patient Spontanous Breathing and Patient connected to nasal cannula oxygen  Post-op Pain: mild  Post-op Assessment: Post-op Vital signs reviewed and Patient's Cardiovascular Status Stable  Post-op Vital Signs: stable  Complications: No apparent anesthesia complications

## 2012-07-05 NOTE — Procedures (Unsigned)
VASCULAR LAB EXAM  INDICATION:  Preoperative vein mapping for planned fem-pop graft.  HISTORY: Diabetes:  Yes Cardiac: Hypertension:  Yes  EXAM:  The right great saphenous vein is compressible.  Diameter measurements range from 0.73 to 0.19 cm.  There is a large branch that ranges in diameter from 0.31 to 0.4 cm.  Please see attached work sheet for diagram details.  IMPRESSION:  Patent right great saphenous vein with diameter measurements as described above.  ___________________________________________ Di Kindle. Edilia Bo, M.D.  LT/MEDQ  D:  06/27/2012  T:  06/27/2012  Job:  [id number]

## 2012-07-06 ENCOUNTER — Encounter (HOSPITAL_COMMUNITY): Payer: Self-pay | Admitting: Vascular Surgery

## 2012-07-06 LAB — CBC
HCT: 34.8 % — ABNORMAL LOW (ref 39.0–52.0)
Hemoglobin: 11.5 g/dL — ABNORMAL LOW (ref 13.0–17.0)
MCH: 28.6 pg (ref 26.0–34.0)
MCHC: 33 g/dL (ref 30.0–36.0)
MCV: 86.6 fL (ref 78.0–100.0)
Platelets: 144 10*3/uL — ABNORMAL LOW (ref 150–400)
RBC: 4.02 MIL/uL — ABNORMAL LOW (ref 4.22–5.81)
RDW: 14.8 % (ref 11.5–15.5)
WBC: 9.4 10*3/uL (ref 4.0–10.5)

## 2012-07-06 LAB — BASIC METABOLIC PANEL
BUN: 14 mg/dL (ref 6–23)
CO2: 21 mEq/L (ref 19–32)
Calcium: 8 mg/dL — ABNORMAL LOW (ref 8.4–10.5)
Chloride: 109 mEq/L (ref 96–112)
Creatinine, Ser: 1.07 mg/dL (ref 0.50–1.35)
GFR calc Af Amer: 81 mL/min — ABNORMAL LOW (ref >90)
GFR calc non Af Amer: 70 mL/min — ABNORMAL LOW (ref >90)
Glucose, Bld: 194 mg/dL — ABNORMAL HIGH (ref 70–99)
Potassium: 4.4 mEq/L (ref 3.5–5.1)
Sodium: 140 mEq/L (ref 135–145)

## 2012-07-06 LAB — GLUCOSE, CAPILLARY
Glucose-Capillary: 193 mg/dL — ABNORMAL HIGH (ref 70–99)
Glucose-Capillary: 209 mg/dL — ABNORMAL HIGH (ref 70–99)

## 2012-07-06 MED ORDER — INSULIN REGULAR HUMAN (CONC) 500 UNIT/ML ~~LOC~~ SOLN
50.0000 [IU] | Freq: Two times a day (BID) | SUBCUTANEOUS | Status: DC
  Administered 2012-07-06: 100 [IU] via SUBCUTANEOUS
  Administered 2012-07-07 (×2): 75 [IU] via SUBCUTANEOUS
  Administered 2012-07-08: 125 [IU] via SUBCUTANEOUS
  Administered 2012-07-09: 75 [IU] via SUBCUTANEOUS
  Filled 2012-07-06 (×6): qty 20

## 2012-07-06 NOTE — Progress Notes (Signed)
Entered urine culture and deleted.  Entered  for wrong patient. Sean Saunders

## 2012-07-06 NOTE — Progress Notes (Signed)
I agree with the following treatment note after reviewing documentation.   Johnston, Addalie Calles Brynn   OTR/L Pager: [phone] Office: [phone] .   

## 2012-07-06 NOTE — Care Management Note (Signed)
    Page 1 of 1   07/06/2012     2:29:55 PM   CARE MANAGEMENT NOTE 07/06/2012  Patient:  Sean Saunders, Sean Saunders   Account Number:  [id number]  Date Initiated:  07/06/2012  Documentation initiated by:  Donn Pierini  Subjective/Objective Assessment:   Pt admitted s/p REVISION OF  LEFT FEMORAL-POPLITEAL BYPASS GRAFT ARTERY     Action/Plan:   PTA pt lived at home with spouse- PT/OT evals   Anticipated DC Date:  07/09/2012   Anticipated DC Plan:  SKILLED NURSING FACILITY  In-house referral  Clinical Social Worker      DC Planning Services  CM consult      Choice offered to / List presented to:             Status of service:  In process, will continue to follow Medicare Important Message given?   (If response is "NO", the following Medicare IM given date fields will be blank) Date Medicare IM given:   Date Additional Medicare IM given:    Discharge Disposition:    Per UR Regulation:  Reviewed for med. necessity/level of care/duration of stay  If discussed at Long Length of Stay Meetings, dates discussed:    Comments:  PCP- Gwynneth Aliment  07/06/12- 1130- Donn Pierini RN, BSN [phone] Pt would like [facility] for ST-SNF, per PT/OT ST-SNF recommended for rehab- CSW consulted for possible placement

## 2012-07-06 NOTE — Evaluation (Signed)
Physical Therapy Evaluation Patient Details Name: Sean Saunders MRN: [id number] DOB: [birth year] Today's Date: 07/06/2012 Time: 2595-6387 PT Time Calculation (min): 31 min  PT Assessment / Plan / Recommendation Clinical Impression  Pt s/p fem-pop bypass. Pt with decreased activity tolerance and functional mobility secondary to pain. Pt will benefit from skilled PT in the acute care setting in order to maximize functional mobility and strength.    PT Assessment  Patient needs continued PT services    Follow Up Recommendations  Skilled nursing facility    Barriers to Discharge        Equipment Recommendations  Defer to next venue    Recommendations for Other Services     Frequency Min 4X/week    Precautions / Restrictions Precautions Precautions: None Restrictions Weight Bearing Restrictions: No         Mobility  Bed Mobility Bed Mobility: Supine to Sit;Sitting - Scoot to Edge of Bed Supine to Sit: 1: +2 Total assist Supine to Sit: Patient Percentage: 30% Sitting - Scoot to Edge of Bed: 3: Mod assist Details for Bed Mobility Assistance: VC for proper sequencing. Assist through trunk and with bil LEs secondary to pain.  Transfers Transfers: Sit to Stand;Stand to Sit Sit to Stand: 1: +2 Total assist;With upper extremity assist;From bed Sit to Stand: Patient Percentage: 40% Stand to Sit: 1: +2 Total assist;With upper extremity assist;To chair/3-in-1 Stand to Sit: Patient Percentage: 50% Details for Transfer Assistance: VC for proper sequencing and hand placement. Assist to control ascent/descent and for stability as pt with difficulty extending legs upon standing Ambulation/Gait Ambulation/Gait Assistance: 3: Mod assist Ambulation Distance (Feet): 5 Feet Assistive device: Rolling walker Ambulation/Gait Assistance Details: Once standing, pt with good control of LEs although slow to complete steps secondary to pain with weightbearing. VC for safety and close distance to  RW Gait Pattern: Step-to pattern;Decreased hip/knee flexion - right;Decreased hip/knee flexion - left;Decreased stride length;Wide base of support;Antalgic Gait velocity: decreased gait speed    Exercises     PT Diagnosis: Difficulty walking;Acute pain  PT Problem List: Decreased activity tolerance;Decreased balance;Decreased mobility;Decreased knowledge of use of DME;Decreased safety awareness;Decreased knowledge of precautions;Pain PT Treatment Interventions: DME instruction;Gait training;Functional mobility training;Therapeutic activities;Therapeutic exercise;Balance training;Patient/family education   PT Goals Acute Rehab PT Goals PT Goal Formulation: With patient/family Time For Goal Achievement: 07/13/12 Potential to Achieve Goals: Fair Pt will go Supine/Side to Sit: with min assist PT Goal: Supine/Side to Sit - Progress: Goal set today Pt will go Sit to Supine/Side: with min assist PT Goal: Sit to Supine/Side - Progress: Goal set today Pt will go Sit to Stand: with min assist PT Goal: Sit to Stand - Progress: Goal set today Pt will go Stand to Sit: with min assist PT Goal: Stand to Sit - Progress: Goal set today Pt will Transfer Bed to Chair/Chair to Bed: with mod assist PT Transfer Goal: Bed to Chair/Chair to Bed - Progress: Goal set today Pt will Ambulate: 16 - 50 feet;with min assist PT Goal: Ambulate - Progress: Goal set today  Visit Information  Last PT Received On: 07/06/12 Assistance Needed: +2 PT/OT Co-Evaluation/Treatment: Yes    Subjective Data      Prior Functioning  Home Living Lives With: Spouse Available Help at Discharge: Other (Comment) Type of Home: House Home Access: Stairs to enter [employer] of Steps: 1 Entrance Stairs-Rails: None Home Layout: One level Bathroom Shower/Tub: Forensic scientist: Standard Bathroom Accessibility: Yes How Accessible: Accessible via walker Home Adaptive Equipment: Tub  transfer  bench;Bedside commode/3-in-1;Straight cane;Walker - rolling;Wheelchair - manual;Grab bars in shower Prior Function Level of Independence: Independent Able to Take Stairs?: Yes Driving: Yes Vocation: Retired Comments: helps take care of wife Communication Communication: No difficulties Dominant Hand: Right    Cognition  Overall Cognitive Status: Appears within functional limits for tasks assessed/performed Arousal/Alertness: Awake/alert Orientation Level: Appears intact for tasks assessed Behavior During Session: [facility] for tasks performed    Extremity/Trunk Assessment Right Lower Extremity Assessment RLE ROM/Strength/Tone: Unable to fully assess;Due to pain (pain in groin) RLE Sensation: History of peripheral neuropathy Left Lower Extremity Assessment LLE ROM/Strength/Tone: Unable to fully assess;Due to pain LLE Sensation: History of peripheral neuropathy   Balance    End of Session PT - End of Session Equipment Utilized During Treatment: Gait belt Activity Tolerance: Patient limited by pain;Patient limited by fatigue Patient left: in chair;with call bell/phone within reach;with family/visitor present Nurse Communication: Mobility status   Milana Kidney 07/06/2012, 1:57 PM  07/06/2012 Milana Kidney DPT PAGER: [phone] OFFICE: [phone]

## 2012-07-06 NOTE — Progress Notes (Addendum)
Clinical Social Work Department CLINICAL SOCIAL WORK PLACEMENT NOTE 07/06/2012  Patient:  Sean Saunders, Sean Saunders  Account Number:  [id number] Admit date:  07/05/2012  Clinical Social Worker:  Unk Lightning, LCSW  Date/time:  07/06/2012 11:00 AM  Clinical Social Work is seeking post-discharge placement for this patient at the following level of care:   SKILLED NURSING   (*CSW will update this form in Epic as items are completed)   07/06/2012  Patient/family provided with [facility] Department of Clinical Social Work's list of facilities offering this level of care within the geographic area requested by the patient (or if unable, by the patient's family).  07/06/2012  Patient/family informed of their freedom to choose among providers that offer the needed level of care, that participate in Medicare, Medicaid or managed care program needed by the patient, have an available bed and are willing to accept the patient.  07/06/2012  Patient/family informed of [facility]' ownership interest in [facility], as well as of the fact that they are under no obligation to receive care at this facility.  PASARR submitted to EDS on 07/06/2012 PASARR number received from EDS on 07/06/2012  FL2 transmitted to all facilities in geographic area requested by pt/family on  07/06/2012 FL2 transmitted to all facilities within larger geographic area on   Patient informed that his/her managed care company has contracts with or will negotiate with  certain facilities, including the following:     Patient/family informed of bed offers received:  07/08/2012 Dorma Russell) Patient chooses bed at [facility]) Physician recommends and patient chooses bed at    Patient to be transferred to  on   Patient to be transferred to facility by   The following physician request were entered in Epic:   Additional Comments:

## 2012-07-06 NOTE — Progress Notes (Addendum)
[facility] [place]  Progress Note Bypass Surgery  Date of Surgery: 07/05/2012  Procedure(s): left BYPASS GRAFT FEMORAL-POPLITEAL ARTERY  With GSV harvest right leg Surgeon: Surgeon(s): Chuck Hint, MD  1 Day Post-Op  History of Present Illness  Sean Saunders is a 66 y.o. male who is S/P Procedure(s): BYPASS GRAFT FEMORAL-POPLITEAL ARTERY left.  The patient's pre-op symptoms of pain are Improved . Patients pain is well controlled.  C/O soreness in both legs.   VASC. LAB Studies:        ABI: Pending   Significant Diagnostic Studies: CBC Lab Results  Component Value Date   WBC 9.4 07/06/2012   HGB 11.5* 07/06/2012   HCT 34.8* 07/06/2012   MCV 86.6 07/06/2012   PLT 144* 07/06/2012    BMET     Component Value Date/Time   NA 140 07/06/2012 0450   K 4.4 07/06/2012 0450   CL 109 07/06/2012 0450   CO2 21 07/06/2012 0450   GLUCOSE 194* 07/06/2012 0450   BUN 14 07/06/2012 0450   CREATININE 1.07 07/06/2012 0450   CALCIUM 8.0* 07/06/2012 0450   GFRNONAA 70* 07/06/2012 0450   GFRAA 81* 07/06/2012 0450    COAG Lab Results  Component Value Date   INR 1.09 07/03/2012   INR 1.07 08/09/2011   INR 1.05 02/09/2011   No results found for this basename: PTT    Physical Examination  BP Readings from Last 3 Encounters:  07/06/12 128/62  07/06/12 128/62  07/03/12 150/78   Temp Readings from Last 3 Encounters:  07/06/12 99.5 F (37.5 C)   07/06/12 99.5 F (37.5 C)   07/03/12 97.5 F (36.4 C) Oral   SpO2 Readings from Last 3 Encounters:  07/06/12 94%  07/06/12 94%  07/03/12 98%   Pulse Readings from Last 3 Encounters:  07/06/12 90  07/06/12 90  07/03/12 59    Pt is A&O x 3 right lower, left lower extremity: Incision/s is/are clean,dry.intact, and  healing without hematoma, erythema  Drainage/24hours - JP 1- 35cc; JP 2 - 40cc Limb is warm; with good color Good sensation and motion  Left peroneal pulse is weak and monophasic by  Doppler Left Posterior tibial pulse is  monophasic by Doppler  Right Dorsalis Pedis pulse is monophasic by Doppler RightPosterior tibial pulse is  palpable   Assessment/Plan: Pt. Doing well Post-op pain is controlled Wounds are healing well PT/OT for ambulation Continue wound care as ordered Cont foley for strict i/o today - dc in am D/C a-line Min JP drainage - ? DC today  [name] [phone] 07/06/2012 7:41 AM  D/C foley Transfer to 2000 Pt wants to consider going to [facility] in [place] for a short while (His wife is sick and cannot help him at home) D/C JP's when drainage < 30 cc for 8 hours.   Di Kindle. Edilia Bo, MD, FACS Beeper [phone] 07/06/2012

## 2012-07-06 NOTE — Progress Notes (Addendum)
Clinical Social Work Department BRIEF PSYCHOSOCIAL ASSESSMENT 07/06/2012  Patient:  Sean Saunders, Sean Saunders     Account Number:  [id number]     Admit date:  07/05/2012  Clinical Social Worker:  Dennison Bulla  Date/Time:  07/06/2012 11:00 AM  Referred by:  Physician  Date Referred:  07/06/2012 Referred for  SNF Placement   Other Referral:   Interview type:  Patient Other interview type:   Mother-in-law    PSYCHOSOCIAL DATA Living Status:  FAMILY Admitted from facility:   Level of care:   Primary support name:  Shonnie Primary support relationship to patient:  SPOUSE Degree of support available:   Adequate    CURRENT CONCERNS Current Concerns  Post-Acute Placement   Other Concerns:    SOCIAL WORK ASSESSMENT / PLAN CSW received referral to assist with dc planning. CSW reviewed chart and spoke with PT/OT who reported that patient would benefit from SNF. CSW met with patient at bedside. Mother-in-law was present. Patient agreeable to family involvement during assessment.    CSW introduced myself and explained role. Patient reported that prior to admission he was living with family. Patient's wife has COPD and has hospice at home. Family is agreeable to transporting wife to visit patient at SNF. Patient reports that he is agreeable to ST SNF but only prefers [facility] or [facility]. CSW provided patient and family with SNF list and encouraged patient to complete a countywide search. Patient reported that he would only go to these facilities and stated that if they were not available then he would return home. CSW provided patient with CSW contact information in case any family needed to contact CSW but patient reported he would update wife on dc plans. CSW informed patient of Medicare benefits if he had a 3 day qualifying stay in the hospital. Patient reported understanding.     CSW completed FL2 and faxed out to these facilities. CSW will follow up with bed offers.    Assessment/plan status:  Psychosocial Support/Ongoing Assessment of Needs Other assessment/ plan:   Information/referral to community resources:   SNF list    PATIENT'S/FAMILY'S RESPONSE TO PLAN OF CARE: Patient was alert and oriented. Patient was receptive to CSW consult and agreeable to ST SNF at certain facilities. Patient agreeable to family being involved with care.

## 2012-07-06 NOTE — Progress Notes (Signed)
Occupational Therapy Evaluation Patient Details Name: Sean Saunders MRN: [id number] DOB: 02-09-1946 Today's Date: 07/06/2012 Time: 4098-1191 OT Time Calculation (min): 31 min  OT Assessment / Plan / Recommendation Clinical Impression  66 y.o. pt s/p fem-pop bypass. Pt with decreased activity tolerance and functional mobility secondary to pain. Pt will benefit from skilled OT in the acute care setting in order to maximize independence and safety in ADLs prior to d/c. OT to follow acutely.      OT Assessment  Patient needs continued OT Services    Follow Up Recommendations  Skilled nursing facility    Barriers to Discharge      Equipment Recommendations  Defer to next venue    Recommendations for Other Services    Frequency  Min 2X/week    Precautions / Restrictions Precautions Precautions: None Restrictions Weight Bearing Restrictions: No   Pertinent Vitals/Pain Pain in LLE and groin but not quantified.     ADL  Grooming: Performed;Wash/dry face;Set up Where Assessed - Grooming: Supported sitting Lower Body Dressing: Performed;+1 Total assistance Where Assessed - Lower Body Dressing: Unsupported sitting Toilet Transfer: Simulated;+2 Total assistance Toilet Transfer: Patient Percentage: 40% Toilet Transfer Method: Sit to Barista: Comfort height toilet Equipment Used: Gait belt;Rolling walker ADL Comments: Pt. unable to don socks while sitting EOB due to pain. Pt. washed face while sitting in chair with Setup A. Pt. able to transfer to chair with +2 Total A (40%).     OT Diagnosis: Acute pain  OT Problem List: Decreased strength;Decreased activity tolerance;Impaired balance (sitting and/or standing);Decreased knowledge of use of DME or AE;Pain OT Treatment Interventions: Self-care/ADL training;DME and/or AE instruction;Therapeutic activities;Patient/family education;Balance training   OT Goals Acute Rehab OT Goals OT Goal Formulation: With  patient Time For Goal Achievement: 07/20/12 Potential to Achieve Goals: Good ADL Goals Pt Will Perform Grooming: Standing at sink;with supervision ADL Goal: Grooming - Progress: Goal set today Pt Will Perform Lower Body Bathing: with min assist;Sit to stand from chair ADL Goal: Lower Body Bathing - Progress: Goal set today Pt Will Perform Lower Body Dressing: with min assist;Sit to stand from chair;Sit to stand from bed ADL Goal: Lower Body Dressing - Progress: Goal set today Pt Will Transfer to Toilet: with supervision;Ambulation;3-in-1 ADL Goal: Toilet Transfer - Progress: Goal set today Pt Will Perform Toileting - Clothing Manipulation: with supervision;Standing ADL Goal: Toileting - Clothing Manipulation - Progress: Goal set today Pt Will Perform Toileting - Hygiene: Sit to stand from 3-in-1/toilet;with min assist ADL Goal: Toileting - Hygiene - Progress: Goal set today  Visit Information  Last OT Received On: 07/06/12 Assistance Needed: +2 PT/OT Co-Evaluation/Treatment: Yes    Subjective Data   Pt. Pleasant in session.    Prior Functioning  Vision/Perception  Home Living Lives With: Spouse Available Help at Discharge: Other (Comment) (wife on hospice, mother in law close) Type of Home: House Home Access: Stairs to enter [employer] of Steps: 1 Entrance Stairs-Rails: None Home Layout: One level Bathroom Shower/Tub: Forensic scientist: Standard Bathroom Accessibility: Yes How Accessible: Accessible via walker Home Adaptive Equipment: Tub transfer bench;Bedside commode/3-in-1;Straight cane;Walker - rolling;Wheelchair - manual;Grab bars in shower Prior Function Level of Independence: Independent Able to Take Stairs?: Yes Driving: Yes Vocation: Retired Comments: helps take care of wife Communication Communication: No difficulties Dominant Hand: Right      Cognition  Overall Cognitive Status: Appears within functional limits for  tasks assessed/performed Arousal/Alertness: Awake/alert Orientation Level: Appears intact for tasks assessed Behavior During Session:  WFL for tasks performed    Extremity/Trunk Assessment Right Upper Extremity Assessment RUE ROM/Strength/Tone: WFL for tasks assessed (Shorter 2nd DIP digit ) Left Upper Extremity Assessment LUE ROM/Strength/Tone: WFL for tasks assessed Right Lower Extremity Assessment RLE ROM/Strength/Tone: Unable to fully assess;Due to pain (pain in groin) RLE Sensation: History of peripheral neuropathy Left Lower Extremity Assessment LLE ROM/Strength/Tone: Unable to fully assess;Due to pain LLE Sensation: History of peripheral neuropathy   Mobility Bed Mobility Bed Mobility: Supine to Sit;Sitting - Scoot to Edge of Bed Supine to Sit: 1: +2 Total assist Supine to Sit: Patient Percentage: 30% Sitting - Scoot to Edge of Bed: 3: Mod assist Details for Bed Mobility Assistance: VC for proper sequencing. Assist through trunk and with bil LEs secondary to pain.  Transfers Transfers: Sit to Stand;Stand to Sit Sit to Stand: 1: +2 Total assist;With upper extremity assist;From bed Sit to Stand: Patient Percentage: 40% Stand to Sit: 1: +2 Total assist;With upper extremity assist;To chair/3-in-1 Stand to Sit: Patient Percentage: 50% Details for Transfer Assistance: VC for proper sequencing and hand placement. Assist to control ascent/descent and for stability as pt with difficulty extending legs upon standing         End of Session OT - End of Session Activity Tolerance: Patient tolerated treatment well Patient left: in chair;with call bell/phone within reach;with family/visitor present  GO     Jenell Milliner 07/06/2012, 3:19 PM

## 2012-07-06 NOTE — Progress Notes (Signed)
Spoke briefly with patient.  Has been on U-500 insulin for about 4 years.  States that he has been affected by Agent Orange during the [place] War.  He has had diabetes for about 12-14 years.  Had tried Lantus at one time, but it was not controlling his blood sugars.  States that the U-500 insulin has been much better.  Checks his blood sugars at least 3 times per day.  Takes the U-500 insulin twice a day.  Has been having trouble with circulation in legs and feet over a period of time.  Will continue to follow while in hospital.

## 2012-07-07 DIAGNOSIS — Z48812 Encounter for surgical aftercare following surgery on the circulatory system: Secondary | ICD-10-CM

## 2012-07-07 LAB — GLUCOSE, CAPILLARY
Glucose-Capillary: 213 mg/dL — ABNORMAL HIGH (ref 70–99)
Glucose-Capillary: 164 mg/dL — ABNORMAL HIGH (ref 70–99)
Glucose-Capillary: 121 mg/dL — ABNORMAL HIGH (ref 70–99)
Glucose-Capillary: 189 mg/dL — ABNORMAL HIGH (ref 70–99)

## 2012-07-07 NOTE — Progress Notes (Signed)
VASCULAR LAB PRELIMINARY  ARTERIAL  ABI completed:    RIGHT    LEFT    PRESSURE WAVEFORM  PRESSURE WAVEFORM  BRACHIAL 163  T BRACHIAL  T  DP   DP    AT 150 T AT 108 M  PT 173 T PT 119 M  PER   PER    GREAT TOE  NA GREAT TOE  NA    RIGHT LEFT  ABI >1.0 0.73     Sean Saunders, 07/07/2012, 8:23 AM

## 2012-07-07 NOTE — Progress Notes (Signed)
Pt's JP drains were removed per protocol and MD order, pt tolerated procedure well. Pt resting in bed will continue to  monitor.  Sean Saunders M

## 2012-07-07 NOTE — Progress Notes (Addendum)
[facility]   Progress Note Bypass Surgery  Date of Surgery: 07/05/2012  Procedure(s): left BYPASS GRAFT FEMORAL-POPLITEAL ARTERY Surgeon: Surgeon(s): Chuck Hint, MD  2 Days Post-Op  History of Present Illness  Sean Saunders is a 66 y.o. male who is S/P Procedure(s): BYPASS GRAFT FEMORAL-POPLITEAL ARTERY left.  The patient's pre-op symptoms of claudication are Improved . Patients pain is well controlled.   Overall less sore today  VASC. LAB Studies:        ABI: Right >1.0;  Left 0.73;  Physical Examination  BP Readings from Last 3 Encounters:  07/07/12 173/79  07/07/12 173/79  07/03/12 150/78   Temp Readings from Last 3 Encounters:  07/07/12 98.8 F (37.1 C) Oral  07/07/12 98.8 F (37.1 C) Oral  07/03/12 97.5 F (36.4 C) Oral   SpO2 Readings from Last 3 Encounters:  07/07/12 96%  07/07/12 96%  07/03/12 98%   Pulse Readings from Last 3 Encounters:  07/07/12 82  07/07/12 82  07/03/12 59    Pt is A&O x 3 left lower extremity: Incision/s is/are clean,dry.intact, and  healing without hematoma, erythema or drainage Limb is warm; with good color RLE wound healing with min erythema  Right Dorsalis Pedis pulse is palpable  Left Dorsalis Pedis pulse is absent LeftPosterior tibial pulse is  monophasic by Doppler   Assessment/Plan: Pt. Doing well Post-op pain is controlled Wounds are healing well PT/OT for ambulation Continue wound care as ordered  [name] [phone] 07/07/2012 9:13 AM        I have examined the patient, reviewed and agree with above. Palpable posterior tibial pulse wounds are healing nicely encouraged to mobilize Toshiro Hanken, MD 07/07/2012 11:15 AM

## 2012-07-07 NOTE — Progress Notes (Signed)
Physical Therapy Treatment Patient Details Name: Sean Saunders MRN: [id number] DOB: September 21, 1946 Today's Date: 07/07/2012 Time: 1200-1218 PT Time Calculation (min): 18 min  PT Assessment / Plan / Recommendation Comments on Treatment Session  Good progress from yesterday but pt doesn't remember the PT evaluation and appears slow with processing today (pain meds?)     Follow Up Recommendations  Skilled nursing facility    Barriers to Discharge        Equipment Recommendations  Defer to next venue    Recommendations for Other Services    Frequency Min 3X/week   Plan Discharge plan remains appropriate;Frequency remains appropriate    Precautions / Restrictions Precautions Precautions: Fall Restrictions Weight Bearing Restrictions: No       Mobility  Bed Mobility Bed Mobility: Supine to Sit Supine to Sit: 4: Min assist Sitting - Scoot to Edge of Bed: 5: Supervision Details for Bed Mobility Assistance: cues for sequencing, HOB elevated approx 45 degrees and pt utilizing rails; moving much easier today Transfers Transfers: Sit to Stand;Stand to Sit Sit to Stand: 4: Min assist;From bed;From elevated surface;With upper extremity assist Stand to Sit: 4: Min assist;With upper extremity assist;To chair/3-in-1;With armrests Details for Transfer Assistance: cues for safe technique (pt scooting hips very close to the edge of the bed); facilitation for stability Ambulation/Gait Ambulation/Gait Assistance: 4: Min guard Ambulation Distance (Feet): 10 Feet Assistive device: Rolling walker Ambulation/Gait Assistance Details: slow, cues for upright posture and safe technique with RW, limited by pain, difficulty getting heel to floor Gait Pattern: Step-to pattern    Exercises General Exercises - Lower Extremity Ankle Circles/Pumps: AROM;Both;10 reps;Seated     PT Goals Acute Rehab PT Goals Pt will go Supine/Side to Sit: with modified independence PT Goal: Supine/Side to Sit -  Progress: Updated due to goal met Pt will go Sit to Stand: with modified independence PT Goal: Sit to Stand - Progress: Updated due to goal met Pt will go Stand to Sit: with modified independence PT Goal: Stand to Sit - Progress: Updated due to goals met Pt will Transfer Bed to Chair/Chair to Bed: with modified independence PT Transfer Goal: Bed to Chair/Chair to Bed - Progress: Updated due to goal met PT Goal: Ambulate - Progress: Progressing toward goal  Visit Information  Last PT Received On: 07/07/12 Assistance Needed: +1    Subjective Data  Subjective: I don't remember working with physical therapy yesterday.    Cognition  Overall Cognitive Status: Impaired Area of Impairment: Memory Arousal/Alertness: Awake/alert Orientation Level: Appears intact for tasks assessed Behavior During Session: [facility] for tasks performed Memory Deficits: pt did not remember getting OOB with therapy yesterday Cognition - Other Comments: slower processing    Balance     End of Session PT - End of Session Equipment Utilized During Treatment: Gait belt Activity Tolerance: Patient limited by pain;Patient tolerated treatment well Patient left: in chair;with call bell/phone within reach Nurse Communication: Mobility status;Patient requests pain meds   GP     [facility] HELEN 07/07/2012, 1:27 PM

## 2012-07-08 LAB — GLUCOSE, CAPILLARY
Glucose-Capillary: 82 mg/dL (ref 70–99)
Glucose-Capillary: 200 mg/dL — ABNORMAL HIGH (ref 70–99)
Glucose-Capillary: 258 mg/dL — ABNORMAL HIGH (ref 70–99)
Glucose-Capillary: 222 mg/dL — ABNORMAL HIGH (ref 70–99)

## 2012-07-08 NOTE — Progress Notes (Signed)
CSW met with pt at bedside and spoke with pt's wife via phone re: SNF choice.  Pt and pt's wife have accepted [facility] offer.  Weekday CSW to f/u with [facility] and assist with d/c when pt medically stable. Dellie Burns, MSW, [place] [phone] (weekend)

## 2012-07-08 NOTE — Progress Notes (Addendum)
VASCULAR & [facility]IN SPECIALISTS OF [place]  Progress Note Bypass Surgery  Date of Surgery: 07/05/2012  Procedure(s): BYPASS GRAFT FEMORAL-POPLITEAL ARTERY Surgeon: Surgeon(s): Chuck Hint, MD  3 Days Post-Op  History of Present Illness  Sean Saunders is a 66 y.o. male who is S/P Procedure(s): BYPASS GRAFT FEMORAL-POPLITEAL ARTERY left.  The patient's pre-op symptoms of pain/numbness are Improved . Patients pain is fairly well controlled.   Moving about better today Voiding with foley out  VASC. LAB Studies:        ABI: Right >1.0;  Left 0.73;  Physical Examination  BP Readings from Last 3 Encounters:  07/08/12 162/75  07/08/12 162/75  07/03/12 150/78   Temp Readings from Last 3 Encounters:  07/08/12 98.2 F (36.8 C) Oral  07/08/12 98.2 F (36.8 C) Oral  07/03/12 97.5 F (36.4 C) Oral   SpO2 Readings from Last 3 Encounters:  07/08/12 96%  07/08/12 96%  07/03/12 98%   Pulse Readings from Last 3 Encounters:  07/08/12 70  07/08/12 70  07/03/12 59    Pt is A&O x 3 right lower, left lower extremity: Incision/s is/are clean,dry.intact, and  healing with minimal, erythema but no drainage Limb is warm; with good color  Assessment/Plan: Pt. Doing well Post-op pain is controlled Wounds are mildly erythematous but healing well PT/OT for ambulation Continue wound care as ordered  Sean Saunders [phone] 07/08/2012 8:33 AM        I have examined the patient, reviewed and agree with above.  Sean Kelty, MD 07/08/2012 9:52 AM

## 2012-07-09 ENCOUNTER — Telehealth: Payer: Self-pay | Admitting: Vascular Surgery

## 2012-07-09 DIAGNOSIS — Z5189 Encounter for other specified aftercare: Secondary | ICD-10-CM | POA: Diagnosis not present

## 2012-07-09 DIAGNOSIS — E039 Hypothyroidism, unspecified: Secondary | ICD-10-CM | POA: Diagnosis not present

## 2012-07-09 DIAGNOSIS — I10 Essential (primary) hypertension: Secondary | ICD-10-CM | POA: Diagnosis not present

## 2012-07-09 DIAGNOSIS — IMO0001 Reserved for inherently not codable concepts without codable children: Secondary | ICD-10-CM | POA: Diagnosis not present

## 2012-07-09 DIAGNOSIS — G609 Hereditary and idiopathic neuropathy, unspecified: Secondary | ICD-10-CM | POA: Diagnosis not present

## 2012-07-09 DIAGNOSIS — R609 Edema, unspecified: Secondary | ICD-10-CM | POA: Diagnosis not present

## 2012-07-09 DIAGNOSIS — K5900 Constipation, unspecified: Secondary | ICD-10-CM | POA: Diagnosis not present

## 2012-07-09 DIAGNOSIS — I70309 Unspecified atherosclerosis of unspecified type of bypass graft(s) of the extremities, unspecified extremity: Secondary | ICD-10-CM | POA: Diagnosis not present

## 2012-07-09 DIAGNOSIS — E119 Type 2 diabetes mellitus without complications: Secondary | ICD-10-CM | POA: Diagnosis not present

## 2012-07-09 DIAGNOSIS — K2100 Gastro-esophageal reflux disease with esophagitis, without bleeding: Secondary | ICD-10-CM | POA: Diagnosis not present

## 2012-07-09 DIAGNOSIS — T827XXA Infection and inflammatory reaction due to other cardiac and vascular devices, implants and grafts, initial encounter: Secondary | ICD-10-CM | POA: Diagnosis not present

## 2012-07-09 DIAGNOSIS — J449 Chronic obstructive pulmonary disease, unspecified: Secondary | ICD-10-CM | POA: Diagnosis not present

## 2012-07-09 DIAGNOSIS — T82898A Other specified complication of vascular prosthetic devices, implants and grafts, initial encounter: Secondary | ICD-10-CM | POA: Diagnosis not present

## 2012-07-09 DIAGNOSIS — M79609 Pain in unspecified limb: Secondary | ICD-10-CM | POA: Diagnosis not present

## 2012-07-09 DIAGNOSIS — E785 Hyperlipidemia, unspecified: Secondary | ICD-10-CM | POA: Diagnosis not present

## 2012-07-09 LAB — GLUCOSE, CAPILLARY
Glucose-Capillary: 181 mg/dL — ABNORMAL HIGH (ref 70–99)
Glucose-Capillary: 204 mg/dL — ABNORMAL HIGH (ref 70–99)

## 2012-07-09 MED ORDER — HYDROCODONE-ACETAMINOPHEN 10-325 MG PO TABS: 1.0000 | ORAL_TABLET | ORAL | Status: AC | PRN

## 2012-07-09 MED ORDER — SENNOSIDES-DOCUSATE SODIUM 8.6-50 MG PO TABS: 1.0000 | ORAL_TABLET | Freq: Every evening | ORAL | Status: AC | PRN

## 2012-07-09 MED ORDER — HYDROCODONE-ACETAMINOPHEN 10-325 MG PO TABS
1.0000 | ORAL_TABLET | ORAL | Status: DC | PRN
  Administered 2012-07-09: 1 via ORAL
  Filled 2012-07-09: qty 1

## 2012-07-09 NOTE — Telephone Encounter (Signed)
Message copied by Fredrich Birks on Mon Jul 09, 2012 11:49 AM ------      Message from: Melene Plan      Created: Mon Jul 09, 2012 10:00 AM                   ----- Message -----         From: [name], [place]         Sent: 07/09/2012   8:07 AM           To: Melene Plan, RN            F/u left fem pop -- 3 weeks -- Edilia Bo - no ABI

## 2012-07-09 NOTE — Progress Notes (Signed)
Pt for d/c today to SNF bed at [facility]. Pt and family agreeable.  Plans confirmed with facility.  Planned transfer via EMS. Vickii Penna, LCSWA [phone]  Clinical Social Work

## 2012-07-09 NOTE — Telephone Encounter (Signed)
lvm for patient re: 08/01/12 @ 10:30am with [facility]. Sent letter also, dpm

## 2012-07-09 NOTE — Progress Notes (Signed)
Physical Therapy Treatment Patient Details Name: Sean Saunders MRN: [id number] DOB: 1946-07-05 Today's Date: 07/09/2012 Time: 1050-1107 PT Time Calculation (min): 17 min  PT Assessment / Plan / Recommendation Comments on Treatment Session  Patient progresssing well with ambulation. Patient states that he may go to [facility] today. Patient sitting up in chair and educated to use call bell when ready to go back to bed.     Follow Up Recommendations  Skilled nursing facility    Barriers to Discharge        Equipment Recommendations  Defer to next venue    Recommendations for Other Services    Frequency Min 3X/week   Plan Discharge plan remains appropriate;Frequency remains appropriate    Precautions / Restrictions Precautions Precautions: Fall Restrictions Weight Bearing Restrictions: No   Pertinent Vitals/Pain     Mobility  Bed Mobility Supine to Sit: 5: Supervision Sitting - Scoot to Edge of Bed: 5: Supervision Details for Bed Mobility Assistance: Cues for safety Transfers Sit to Stand: 4: Min assist;With upper extremity assist;From bed Stand to Sit: With armrests;To chair/3-in-1;With upper extremity assist;4: Min guard Details for Transfer Assistance: A for balance and safety with stand. Cues for safe technique and not to "fall" back into recliner.  Ambulation/Gait Ambulation/Gait Assistance: 4: Min guard Ambulation Distance (Feet): 80 Feet Assistive device: Rolling walker Ambulation/Gait Assistance Details: Patient stretching out legs with ambulation. Progressing to step through gait though antalgic.  Gait Pattern: Step-through pattern;Decreased stride length;Trunk flexed;Antalgic    Exercises     PT Diagnosis:    PT Problem List:   PT Treatment Interventions:     PT Goals Acute Rehab PT Goals PT Goal: Supine/Side to Sit - Progress: Progressing toward goal PT Goal: Sit to Stand - Progress: Progressing toward goal PT Goal: Stand to Sit - Progress:  Progressing toward goal PT Transfer Goal: Bed to Chair/Chair to Bed - Progress: Progressing toward goal PT Goal: Ambulate - Progress: Met  Visit Information  Last PT Received On: 07/09/12 Assistance Needed: +1    Subjective Data      Cognition  Overall Cognitive Status: Impaired Area of Impairment: Memory Arousal/Alertness: Awake/alert Orientation Level: Appears intact for tasks assessed Behavior During Session: [facility] for tasks performed Memory Deficits: Patient unable to recall therpay walking with him and stated, "im just kidding, I saw you yesterday" I have not worked with patient.     Balance     End of Session PT - End of Session Equipment Utilized During Treatment: Gait belt Activity Tolerance: Patient tolerated treatment well Patient left: in chair;with call bell/phone within reach Nurse Communication: Mobility status   GP     Fredrich Birks 07/09/2012, 11:55 AM 07/09/2012 Fredrich Birks PTA [phone] pager [phone] office

## 2012-07-09 NOTE — Discharge Summary (Signed)
Vascular and Vein Specialists Discharge Summary   Patient ID:  Sean Saunders MRN: [id number] DOB/AGE: 1946/11/28 66 y.o.  Admit date: 07/05/2012 Discharge date: 07/09/2012 Date of Surgery: 07/05/2012 Surgeon: Surgeon(s): Chuck Hint, MD  Admission Diagnosis: VEIN GRAFT STENOSIS LEFT LE  Discharge Diagnoses:  VEIN GRAFT STENOSIS LEFT LE S/P 1. Revision of left femoral to below knee popliteal artery bypass  2. Vein patch angioplasty of vein graft stenosis in the above-knee segment of femoropopliteal bypass graft using segment of left greater saphenous vein  3. Extension of femoropopliteal bypass graft to tibial peroneal trunk using reversed right greater saphenous vein   Secondary Diagnoses: Past Medical History  Diagnosis Date  . Hypertension   . Diabetes mellitus   . COPD (chronic obstructive pulmonary disease)   . Hyperlipidemia   . Thyroid disease   . TIA (transient ischemic attack) 08/03/2011    Left sided weakness. 3 episodes Feb-March  . TIA (transient ischemic attack)     x 3  . Bronchitis     hx of  . Hypothyroidism   . GERD (gastroesophageal reflux disease)     Procedure(s): BYPASS GRAFT FEMORAL-POPLITEAL ARTERY  Discharged Condition: good  HPI:  Sean Saunders is a 66 y.o. male who had an aortobifemoral bypass graft in August of 2006. In a August of 2012 he had a left femoral to below knee popliteal artery bypass graft with a non-her saphenous vein graft. A routine follow duplex he was noted to have an area of increased velocities within his vein graft behind the knee. Peak systolic velocity in this area was 586 cm/s suggesting a severe stenosis. He underwent arteriogram which confirmed a stenosis in this area although it did not look quite as bad as the velocities suggest. However I think the arteriogram oftentimes underestimates the severity of the stenosis then certainly based on the velocities the stenosis puts the graft at risk for thrombosis. The  patient describes some mild claudication symptoms in the left but not on the right. He's had no rest pain no history nonhealing ulcers. He does have a history of neuropathy. He was admitted for redo bypass LLE.   Hospital Course:  Sean Saunders is a 66 y.o. male is S/P Left Procedure(s): BYPASS GRAFT FEMORAL-POPLITEAL ARTERY Extubated: POD # 0 Post-op wounds healing well Pt. Ambulating, voiding and taking PO diet without difficulty. Pt pain controlled with PO pain meds. Labs as below Complications:none  Consults:     Significant Diagnostic Studies: CBC Lab Results  Component Value Date   WBC 9.4 07/06/2012   HGB 11.5* 07/06/2012   HCT 34.8* 07/06/2012   MCV 86.6 07/06/2012   PLT 144* 07/06/2012    BMET    Component Value Date/Time   NA 140 07/06/2012 0450   K 4.4 07/06/2012 0450   CL 109 07/06/2012 0450   CO2 21 07/06/2012 0450   GLUCOSE 194* 07/06/2012 0450   BUN 14 07/06/2012 0450   CREATININE 1.07 07/06/2012 0450   CALCIUM 8.0* 07/06/2012 0450   GFRNONAA 70* 07/06/2012 0450   GFRAA 81* 07/06/2012 0450   COAG Lab Results  Component Value Date   INR 1.09 07/03/2012   INR 1.07 08/09/2011   INR 1.05 02/09/2011     Disposition:  Discharge to :Skilled nursing facility Discharge Orders    Future Orders Please Complete By Expires   Resume previous diet      Call MD for:  temperature >100.5      Call MD for:  redness, tenderness, or signs of infection (pain, swelling, bleeding, redness, odor or green/yellow discharge around incision site)      Call MD for:  severe or increased pain, loss or decreased feeling  in affected limb(s)      Increase activity slowly      Comments:   Walk with assistance use walker or cane as needed   May shower       No dressing needed      Discharge wound care:      Comments:   Paint wounds with betadine 2 x daily      Ira, [place]  Home Medication Instructions [id number]   Printed on:07/09/12 0808  Medication Information                     furosemide (LASIX) 20 MG tablet Take 20 mg by mouth daily.            sitaGLIPtin (JANUVIA) 100 MG tablet Take 100 mg by mouth daily.             clopidogrel (PLAVIX) 75 MG tablet Take 75 mg by mouth daily.             levothyroxine (SYNTHROID, LEVOTHROID) 137 MCG tablet Take 137 mcg by mouth daily.             tiotropium (SPIRIVA) 18 MCG inhalation capsule Place 18 mcg into inhaler and inhale daily.             gabapentin (NEURONTIN) 600 MG tablet Take 600 mg by mouth at bedtime.             Fluticasone-Salmeterol (ADVAIR) 250-50 MCG/DOSE AEPB Inhale 1 puff into the lungs every 12 (twelve) hours.             metFORMIN (GLUCOPHAGE XR) 500 MG 24 hr tablet Take 1,000 mg by mouth 2 (two) times daily.           rosuvastatin (CRESTOR) 10 MG tablet Take 10 mg by mouth daily.           valsartan (DIOVAN) 320 MG tablet Take 320 mg by mouth daily.           simvastatin (ZOCOR) 40 MG tablet Take 40 mg by mouth every evening.           omeprazole (PRILOSEC) 20 MG capsule Take 20 mg by mouth 2 (two) times daily.           nebivolol (BYSTOLIC) 5 MG tablet Take 5 mg by mouth daily.           amLODipine (NORVASC) 10 MG tablet Take 10 mg by mouth daily.           insulin regular human CONCENTRATED (HUMULIN R) 500 UNIT/ML SOLN injection Inject 10-30 Units into the skin. Patient's home scale: CBB>300: 30 units CBG 250-300: 25 units CBG 200-250: 20 units CBG 150-200: 15 units CBG 100-150: 10 units CBG <100: hold dose           HYDROcodone-acetaminophen (NORCO) 10-325 MG per tablet Take 1 tablet by mouth every 4 (four) hours as needed.           senna-docusate (SENOKOT-S) 8.6-50 MG per tablet Take 1 tablet by mouth at bedtime as needed.            Verbal and written Discharge instructions given to the patient. Wound care per Discharge AVS   Signed: [name] 07/09/2012, 8:08 AM  Agree with above. Brisk doppler flow  left foot. Incisions look fine. Ok for  d/c to [facility] today. F/U 2 weeks  Di Kindle. Edilia Bo, MD, FACS Beeper [phone] 07/09/2012

## 2012-07-09 NOTE — Progress Notes (Signed)
VASCULAR & VEIN SPECIALISTS OF [place]  Progress Note Bypass Surgery  Date of Surgery: 07/05/2012  Procedure(s): BYPASS GRAFT FEMORAL-POPLITEAL ARTERY Surgeon: Surgeon(s): Chuck Hint, MD  4 Days Post-Op  History of Present Illness  Sean Saunders is a 66 y.o. male who is S/P Procedure(s): BYPASS GRAFT FEMORAL-POPLITEAL ARTERY left.  The patient's pre-op symptoms of pain are Improved . Patients pain is well controlled.  Will start PO pain meds. Beginning to ambulate  Physical Examination  BP Readings from Last 3 Encounters:  07/09/12 121/68  07/09/12 121/68  07/03/12 150/78   Temp Readings from Last 3 Encounters:  07/09/12 97.6 F (36.4 C)   07/09/12 97.6 F (36.4 C)   07/03/12 97.5 F (36.4 C) Oral   SpO2 Readings from Last 3 Encounters:  07/09/12 95%  07/09/12 95%  07/03/12 98%   Pulse Readings from Last 3 Encounters:  07/09/12 73  07/09/12 73  07/03/12 59    Pt is A&O x 3 right lower, left lower extremity: Incision/s is/are clean,dry.intact, and  healing without hematoma, erythema or drainage Limb is warm; with good color  Assessment/Plan: Pt. Doing well Post-op pain is controlled Wounds are healing well PT/OT for ambulation Continue wound care as ordered To SNF when bed available  Mylen Mangan J [phone] 07/09/2012 8:01 AM

## 2012-07-11 DIAGNOSIS — I10 Essential (primary) hypertension: Secondary | ICD-10-CM | POA: Diagnosis not present

## 2012-07-11 DIAGNOSIS — J449 Chronic obstructive pulmonary disease, unspecified: Secondary | ICD-10-CM | POA: Diagnosis not present

## 2012-07-11 DIAGNOSIS — IMO0001 Reserved for inherently not codable concepts without codable children: Secondary | ICD-10-CM | POA: Diagnosis not present

## 2012-07-11 DIAGNOSIS — I70309 Unspecified atherosclerosis of unspecified type of bypass graft(s) of the extremities, unspecified extremity: Secondary | ICD-10-CM | POA: Diagnosis not present

## 2012-07-12 DIAGNOSIS — E039 Hypothyroidism, unspecified: Secondary | ICD-10-CM | POA: Diagnosis not present

## 2012-07-12 DIAGNOSIS — IMO0001 Reserved for inherently not codable concepts without codable children: Secondary | ICD-10-CM | POA: Diagnosis not present

## 2012-07-12 DIAGNOSIS — R609 Edema, unspecified: Secondary | ICD-10-CM | POA: Diagnosis not present

## 2012-07-12 DIAGNOSIS — E785 Hyperlipidemia, unspecified: Secondary | ICD-10-CM | POA: Diagnosis not present

## 2012-07-12 DIAGNOSIS — I10 Essential (primary) hypertension: Secondary | ICD-10-CM | POA: Diagnosis not present

## 2012-07-12 DIAGNOSIS — T82898A Other specified complication of vascular prosthetic devices, implants and grafts, initial encounter: Secondary | ICD-10-CM | POA: Diagnosis not present

## 2012-07-12 DIAGNOSIS — G609 Hereditary and idiopathic neuropathy, unspecified: Secondary | ICD-10-CM | POA: Diagnosis not present

## 2012-07-17 DIAGNOSIS — I10 Essential (primary) hypertension: Secondary | ICD-10-CM | POA: Diagnosis not present

## 2012-07-17 DIAGNOSIS — IMO0001 Reserved for inherently not codable concepts without codable children: Secondary | ICD-10-CM | POA: Diagnosis not present

## 2012-07-17 DIAGNOSIS — Z8249 Family history of ischemic heart disease and other diseases of the circulatory system: Secondary | ICD-10-CM | POA: Diagnosis not present

## 2012-07-17 DIAGNOSIS — J209 Acute bronchitis, unspecified: Secondary | ICD-10-CM | POA: Diagnosis not present

## 2012-07-23 ENCOUNTER — Other Ambulatory Visit (INDEPENDENT_AMBULATORY_CARE_PROVIDER_SITE_OTHER): Payer: Self-pay | Admitting: Otolaryngology

## 2012-07-23 DIAGNOSIS — D333 Benign neoplasm of cranial nerves: Secondary | ICD-10-CM

## 2012-07-26 ENCOUNTER — Ambulatory Visit
Admission: RE | Admit: 2012-07-26 | Discharge: 2012-07-26 | Disposition: A | Discharge status: Home / Self Care | Payer: Medicare Other | Source: Ambulatory Visit | Attending: Otolaryngology | Admitting: Otolaryngology

## 2012-07-26 DIAGNOSIS — D333 Benign neoplasm of cranial nerves: Secondary | ICD-10-CM

## 2012-07-26 DIAGNOSIS — Q8503 Schwannomatosis: Secondary | ICD-10-CM | POA: Diagnosis not present

## 2012-07-26 MED ORDER — GADOBENATE DIMEGLUMINE 529 MG/ML IV SOLN
19.0000 mL | Freq: Once | INTRAVENOUS | Status: AC | PRN
  Administered 2012-07-26: 19 mL via INTRAVENOUS

## 2012-07-31 ENCOUNTER — Encounter: Payer: Self-pay | Admitting: Vascular Surgery

## 2012-08-01 ENCOUNTER — Encounter: Payer: Self-pay | Admitting: Vascular Surgery

## 2012-08-01 ENCOUNTER — Ambulatory Visit (INDEPENDENT_AMBULATORY_CARE_PROVIDER_SITE_OTHER): Payer: Medicare Other | Admitting: Vascular Surgery

## 2012-08-01 VITALS — BP 165/98 | HR 67 | Temp 98.4°F | Ht 72.0 in | Wt 232.0 lb

## 2012-08-01 DIAGNOSIS — D333 Benign neoplasm of cranial nerves: Secondary | ICD-10-CM | POA: Diagnosis not present

## 2012-08-01 DIAGNOSIS — I70219 Atherosclerosis of native arteries of extremities with intermittent claudication, unspecified extremity: Secondary | ICD-10-CM

## 2012-08-01 DIAGNOSIS — Z48812 Encounter for surgical aftercare following surgery on the circulatory system: Secondary | ICD-10-CM

## 2012-08-01 DIAGNOSIS — H903 Sensorineural hearing loss, bilateral: Secondary | ICD-10-CM | POA: Diagnosis not present

## 2012-08-01 NOTE — Progress Notes (Signed)
Vascular and Vein Specialist of [facility]  Patient name: Sean Saunders MRN: [id number] DOB: [birth year] Sex: male  REASON FOR VISIT: follow up after revision of left femoral pop bypass.  HPI: Sean Saunders is a 65 y.o. male who had vein patch angioplasty of a vein graft stenosis in the above knee segment of his femoropopliteal graft using left greater saphenous vein and extension of the femoropopliteal bypass graft to the tibial peroneal trunk using reversed right greater saphenous vein. He comes in for his first outpatient visit. He's had no significant claudication. He said only moderate leg swelling. Overall he is doing well. Unfortunately, his wife is in the terminal stages of her lung problems.  REVIEW OF SYSTEMS: Arly.Keller ] denotes positive finding; [  ] denotes negative finding  CARDIOVASCULAR:  [ ]  chest pain   [ ]  dyspnea on exertion    CONSTITUTIONAL:  [ ]  fever   [ ]  chills  PHYSICAL EXAM: Filed Vitals:   08/01/12 1056  BP: 165/98  Pulse: 67  Temp: 98.4 F (36.9 C)  TempSrc: Oral  Height: 6' (1.829 m)  Weight: 232 lb (105.235 kg)  SpO2: 100%   Body mass index is 31.47 kg/(m^2). GENERAL: The patient is a well-nourished male, in no acute distress. The vital signs are documented above. CARDIOVASCULAR: There is a regular rate and rhythm  PULMONARY: There is good air exchange bilaterally without wheezing or rales. The left foot is warm and well-perfused. His incisions are healing nicely. He does have a small lymphocele in his vein harvest site in the right side.  MEDICAL ISSUES: Patient is doing well status post revision of his right femoropopliteal bypass graft. He will be due for a duplex and ABIs in 6 months. See him back at that time. He knows to call sooner if he has problems.  Renelda Kilian S [facility] Ve[facility] Specialists of [place] Beeper: [phone]

## 2012-08-10 DIAGNOSIS — E119 Type 2 diabetes mellitus without complications: Secondary | ICD-10-CM | POA: Diagnosis not present

## 2012-08-22 DIAGNOSIS — E1129 Type 2 diabetes mellitus with other diabetic kidney complication: Secondary | ICD-10-CM | POA: Diagnosis not present

## 2012-08-22 DIAGNOSIS — E1165 Type 2 diabetes mellitus with hyperglycemia: Secondary | ICD-10-CM | POA: Diagnosis not present

## 2012-08-22 DIAGNOSIS — I739 Peripheral vascular disease, unspecified: Secondary | ICD-10-CM | POA: Diagnosis not present

## 2012-08-22 DIAGNOSIS — N182 Chronic kidney disease, stage 2 (mild): Secondary | ICD-10-CM | POA: Diagnosis not present

## 2012-08-22 DIAGNOSIS — J441 Chronic obstructive pulmonary disease with (acute) exacerbation: Secondary | ICD-10-CM | POA: Diagnosis not present

## 2012-08-24 ENCOUNTER — Ambulatory Visit (INDEPENDENT_AMBULATORY_CARE_PROVIDER_SITE_OTHER): Payer: Medicare Other | Admitting: Vascular Surgery

## 2012-08-24 ENCOUNTER — Encounter: Payer: Self-pay | Admitting: Vascular Surgery

## 2012-08-24 VITALS — BP 154/63 | HR 73 | Temp 98.3°F | Ht 72.0 in | Wt 244.0 lb

## 2012-08-24 DIAGNOSIS — E109 Type 1 diabetes mellitus without complications: Secondary | ICD-10-CM | POA: Diagnosis not present

## 2012-08-24 DIAGNOSIS — Q828 Other specified congenital malformations of skin: Secondary | ICD-10-CM | POA: Diagnosis not present

## 2012-08-24 DIAGNOSIS — L989 Disorder of the skin and subcutaneous tissue, unspecified: Secondary | ICD-10-CM | POA: Diagnosis not present

## 2012-08-24 DIAGNOSIS — I70219 Atherosclerosis of native arteries of extremities with intermittent claudication, unspecified extremity: Secondary | ICD-10-CM

## 2012-08-24 DIAGNOSIS — M79609 Pain in unspecified limb: Secondary | ICD-10-CM | POA: Diagnosis not present

## 2012-08-24 MED ORDER — CIPROFLOXACIN HCL 500 MG PO TABS: 500.0000 mg | ORAL_TABLET | Freq: Two times a day (BID) | ORAL | Status: AC

## 2012-08-24 NOTE — Progress Notes (Signed)
VASCULAR & VEIN SPECIALISTS OF [place]  Postoperative Visit  History of Present Illness  Sean Saunders is a 66 y.o. year old male who presents for postoperative follow-up for:  PROCEDURE:  1. Revision of left femoral to below knee popliteal artery bypass  2. Vein patch angioplasty of vein graft stenosis in the above-knee segment of femoropopliteal bypass graft using segment of left greater saphenous vein  3. Extension of femoropopliteal bypass graft to tibial peroneal trunk using reversed right greater saphenous vein  4. Intraoperative arteriogram times one (Date: 07/05/12).  The patient's wounds are healed.  The patient notes improvement of lower extremity symptoms.  The patient is able to complete their activities of daily living.  The patient's current symptoms are: neuropathic pain in right vein harvest site and neuropathic pain in left leg incisions.  Patient notes increasing erythema in left leg without fever or chills.  She also notes continued swelling in that leg.  Physical Examination  Filed Vitals:   08/24/12 1545  BP: 154/63  Pulse: 73  Temp: 98.3 F (36.8 C)   RLE: incision healed  LLE: Incisions are healed, faintly PT palpable, warm left foot, blanching erythema in above the knee and below the knee incision  Medical Decision Making  Sean Saunders is a 66 y.o. year old male who presents s/p revision and extension of L fem-AK pop to L fem-TPT bypass. The patient's bypass incisions are healing appropriately with possibly some cellulitis I gave the patient Cipro 500 mg 1 PO bid x 10 days  The patient will follow with Dr. Edilia Bo in two weeks.  Thank you for allowing [place] to participate in this patient's care.  Leonides Sake, MD [facility] Specialists of [place] Office: [phone] Pager: [phone]

## 2012-09-05 ENCOUNTER — Ambulatory Visit: Payer: Medicare Other | Admitting: Vascular Surgery

## 2012-09-05 ENCOUNTER — Telehealth: Payer: Self-pay

## 2012-09-05 DIAGNOSIS — L03116 Cellulitis of left lower limb: Secondary | ICD-10-CM

## 2012-09-05 MED ORDER — CIPROFLOXACIN HCL 500 MG PO TABS: 500.0000 mg | ORAL_TABLET | Freq: Two times a day (BID) | ORAL | Status: DC

## 2012-09-05 NOTE — Telephone Encounter (Signed)
Discussed pt's symptoms with Dr. Edilia Bo.  Rec'd v.o. For Cipro 500 mg 1 tab po. Bid x 14 days.  Pt. notified.

## 2012-09-05 NOTE — Telephone Encounter (Signed)
Pt. Called to request a refill of his Cipro.  Stated that his left leg redness had improved, but not completely cleared-up, and voiced concern about taking last dose of antibiotic, without resolution of symptoms.  Has f/u appt. With Dr. Edilia Bo on 09/12/12.  Stated his wife is very ill, and he is unable to leave her at this time; requested to get antibiotic refilled to carry him through to his f/u appt. Next week.  Advised pt. Will discuss w/ Dr. Edilia Bo in AM, 9/25.

## 2012-09-10 IMAGING — CR DG CHEST 2V
2 series · 2 of 2 positions shown · non-contrast
Comparison: 01/31/2008

CLINICAL DATA: Preop left femoral popliteal bypass

CHEST - 2 VIEW

[view not recorded (1 of 2)]
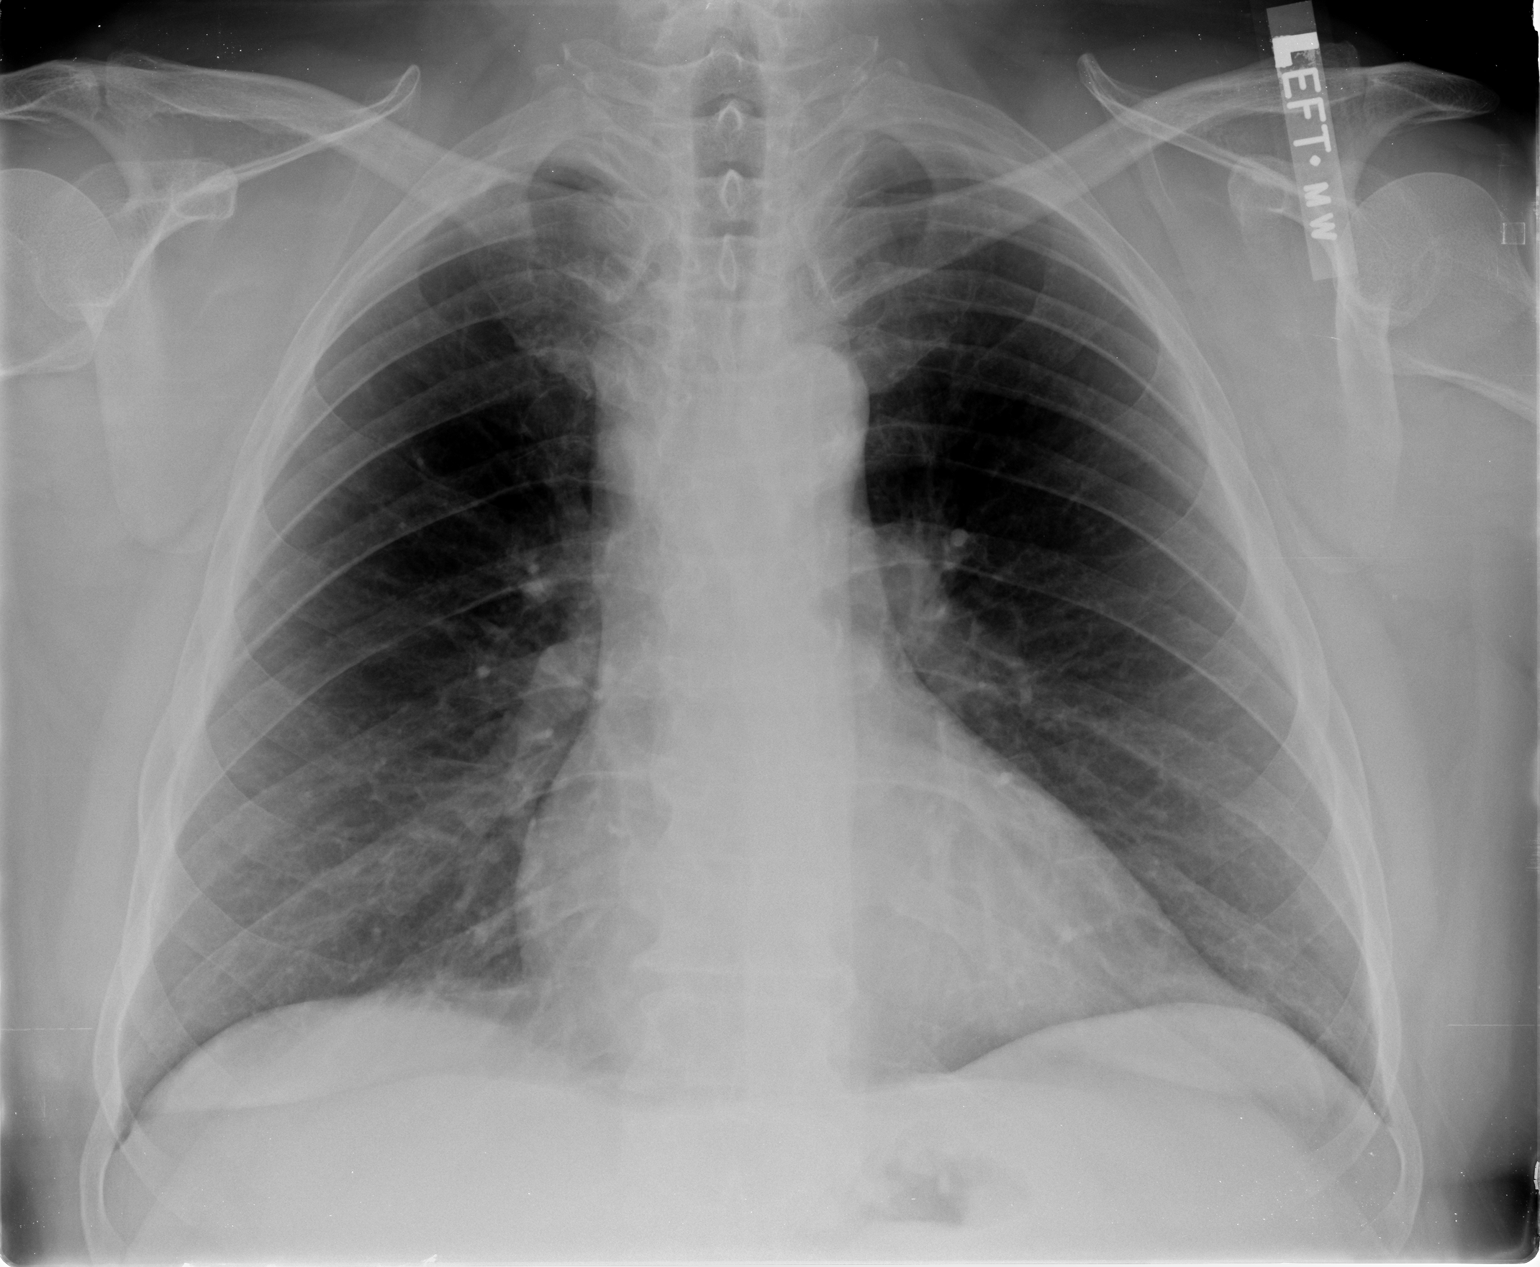

[view not recorded (2 of 2)]
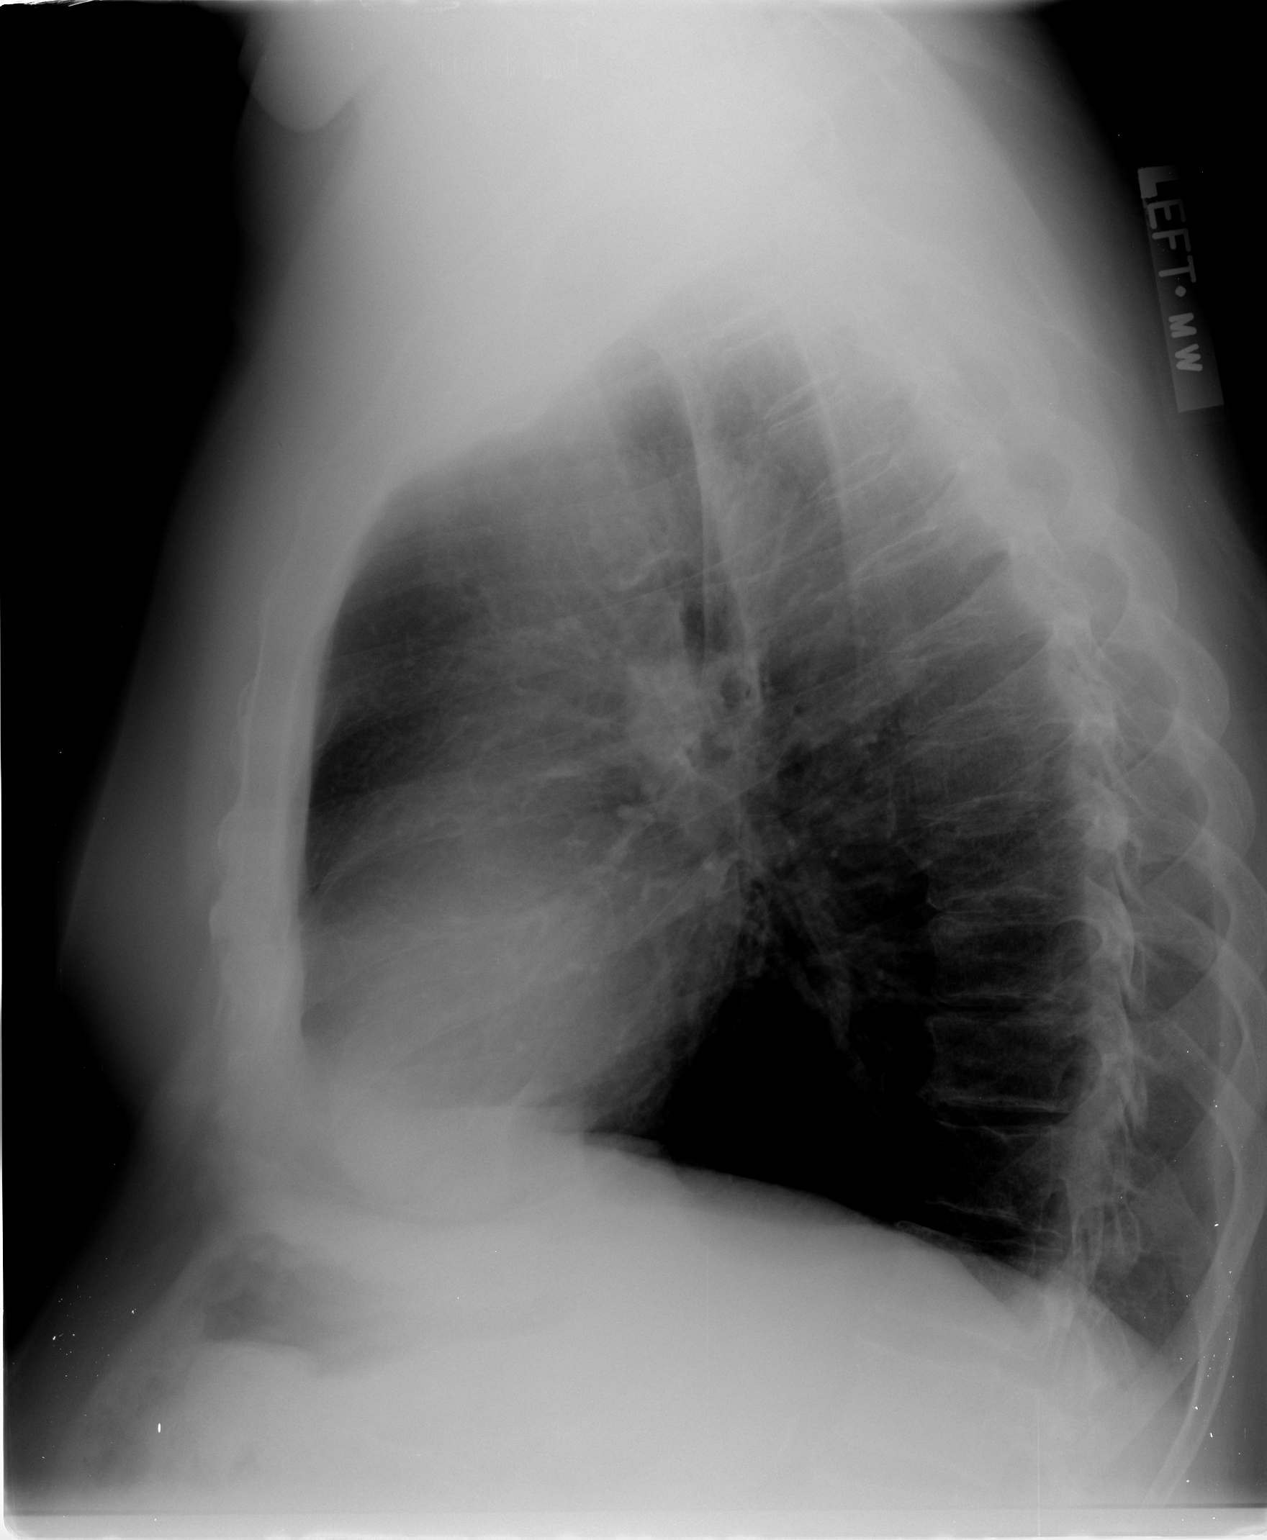

[2 of 2 positions shown; findings below may reference images not displayed]

FINDINGS: Minimal scar versus atelectasis at the right lung base.
Lungs otherwise clear. No pleural effusion or pneumothorax.

The heart is top normal in size.

Degenerative changes of the visualized thoracolumbar spine.
IMPRESSION: No evidence of acute cardiopulmonary disease.

## 2012-09-11 ENCOUNTER — Encounter: Payer: Self-pay | Admitting: Vascular Surgery

## 2012-09-12 ENCOUNTER — Ambulatory Visit (INDEPENDENT_AMBULATORY_CARE_PROVIDER_SITE_OTHER): Payer: Medicare Other | Admitting: Vascular Surgery

## 2012-09-12 ENCOUNTER — Encounter: Payer: Self-pay | Admitting: Vascular Surgery

## 2012-09-12 VITALS — BP 160/84 | HR 68 | Temp 97.3°F | Resp 16 | Ht 72.0 in | Wt 240.4 lb

## 2012-09-12 DIAGNOSIS — I70219 Atherosclerosis of native arteries of extremities with intermittent claudication, unspecified extremity: Secondary | ICD-10-CM

## 2012-09-12 NOTE — Progress Notes (Signed)
Vascular and Vein Specialist of [facility]  Patient name: Sean Saunders MRN: [id number] DOB: 1945-12-25 Sex: male  REASON FOR VISIT: follow up of cellulitis of left leg.  HPI: Sean Saunders is a 66 y.o. male who underwent revision of a left femoropopliteal bypass graft on 07/05/2012. He had vein patch angioplasty of a vein graft stenosis in the above-knee segment of his femoropopliteal graft and extension of the femoropopliteal bypass graft to the tibial peroneal trunk using a segment of reverse saphenous vein. He was seen in the office on 08/24/2012 and had some mild cellulitis. He was put on Cipro which is still on. He is about to complete this. He states that the cellulitis has significantly improved. He is ambulating without difficulty. He has no specific complaints of claudication or rest pain.   REVIEW OF SYSTEMS: Arly.Keller ] denotes positive finding; [  ] denotes negative finding  CARDIOVASCULAR:  [ ]  chest pain   [ ]  dyspnea on exertion    CONSTITUTIONAL:  [ ]  fever   [ ]  chills  PHYSICAL EXAM: Filed Vitals:   09/12/12 0928  BP: 160/84  Pulse: 68  Temp: 97.3 F (36.3 C)  TempSrc: Oral  Resp: 16  Height: 6' (1.829 m)  Weight: 240 lb 6.4 oz (109.045 kg)  SpO2: 100%   Body mass index is 32.60 kg/(m^2). GENERAL: The patient is a well-nourished male, in no acute distress. The vital signs are documented above. CARDIOVASCULAR: There is a regular rate and rhythm  PULMONARY: There is good air exchange bilaterally without wheezing or rales. He has a palpable popliteal pulse bilaterally. He has a palpable right posterior tibial pulse. He has moderate swelling in the left leg which makes it difficult to palpate his pedal pulses.  MEDICAL ISSUES: He is on a scheduled to return proximally 6 months with follow up Doppler studies. He knows to call sooner for has problems. In the meantime I have encouraged him to elevate his leg as much as possible. He'll complete his Cipro and will call if he  develops any recurrent cellulitis.  DICKSON,CHRISTOPHER S [facility] Ve[facility] Specialists of [place] Beeper: [phone]

## 2012-11-13 ENCOUNTER — Encounter: Payer: Self-pay | Admitting: Neurosurgery

## 2012-11-13 ENCOUNTER — Ambulatory Visit (INDEPENDENT_AMBULATORY_CARE_PROVIDER_SITE_OTHER): Payer: Medicare Other | Admitting: Neurosurgery

## 2012-11-13 VITALS — BP 174/75 | HR 60 | Resp 16 | Ht 72.0 in | Wt 233.9 lb

## 2012-11-13 DIAGNOSIS — I70219 Atherosclerosis of native arteries of extremities with intermittent claudication, unspecified extremity: Secondary | ICD-10-CM | POA: Diagnosis not present

## 2012-11-13 DIAGNOSIS — T8140XA Infection following a procedure, unspecified, initial encounter: Secondary | ICD-10-CM

## 2012-11-13 MED ORDER — CIPROFLOXACIN HCL 500 MG PO TABS: 500.0000 mg | ORAL_TABLET | Freq: Two times a day (BID) | ORAL | Status: DC

## 2012-11-13 NOTE — Progress Notes (Signed)
Subjective:     Patient ID: Sean Saunders, male   DOB: [birth year], 66 y.o.   MRN: [id number]  HPI: 66 year old male patient of Dr. Edilia Bo who is status post a revision of the left femoropopliteal bypass in July 2013. The patient states she's had some mild cellulitis and was placed on Cipro on 2 occasions prior to this event. Patient states 2-3 days ago he started noticing some redness around the distal incision and asked to be evaluated. The patient reports no claudication or rest pain.   Review of Systems: 12 point review of systems is notable for the difficulties described above otherwise unremarkable     Objective:   Physical Exam: Afebrile, vital signs are stable, there is a small area of erythema that lies along the distal incision line. The patient was also examined by Dr. Arbie Cookey. We are not sure that this is not some type of dermatitis however we will start the patient on some antibiotics and Dr. Arbie Cookey recommended the patient use hydrocortisone cream.     Assessment:     Mild erythema along distal incision of left lower extremity possible cellulitis possible dermatitis.    Plan:     Cipro 500 mg twice a day for 10 days, and hydrocortisone cream locally and followup with Dr. Edilia Bo per Dr. Arbie Cookey in 2-3 weeks. The patient knows to call office if he has any difficulties in the interim. The patient's questions were encouraged and answered, he is in agreement with this plan.  Lauree Chandler ANP  Clinic M.D.: [place]

## 2012-11-23 DIAGNOSIS — E109 Type 1 diabetes mellitus without complications: Secondary | ICD-10-CM | POA: Diagnosis not present

## 2012-11-23 DIAGNOSIS — Q828 Other specified congenital malformations of skin: Secondary | ICD-10-CM | POA: Diagnosis not present

## 2012-11-23 DIAGNOSIS — M79609 Pain in unspecified limb: Secondary | ICD-10-CM | POA: Diagnosis not present

## 2012-11-27 ENCOUNTER — Encounter: Payer: Self-pay | Admitting: Vascular Surgery

## 2012-11-28 ENCOUNTER — Encounter: Payer: Self-pay | Admitting: Vascular Surgery

## 2012-11-28 ENCOUNTER — Ambulatory Visit (INDEPENDENT_AMBULATORY_CARE_PROVIDER_SITE_OTHER): Payer: Medicare Other | Admitting: Vascular Surgery

## 2012-11-28 VITALS — BP 153/60 | HR 60 | Temp 98.2°F | Ht 72.0 in | Wt 229.6 lb

## 2012-11-28 DIAGNOSIS — T8140XA Infection following a procedure, unspecified, initial encounter: Secondary | ICD-10-CM | POA: Diagnosis not present

## 2012-11-28 DIAGNOSIS — I70219 Atherosclerosis of native arteries of extremities with intermittent claudication, unspecified extremity: Secondary | ICD-10-CM

## 2012-11-28 DIAGNOSIS — F331 Major depressive disorder, recurrent, moderate: Secondary | ICD-10-CM | POA: Diagnosis not present

## 2012-11-28 DIAGNOSIS — Z48812 Encounter for surgical aftercare following surgery on the circulatory system: Secondary | ICD-10-CM

## 2012-11-28 DIAGNOSIS — G4700 Insomnia, unspecified: Secondary | ICD-10-CM | POA: Diagnosis not present

## 2012-11-28 DIAGNOSIS — N182 Chronic kidney disease, stage 2 (mild): Secondary | ICD-10-CM | POA: Diagnosis not present

## 2012-11-28 DIAGNOSIS — Z79899 Other long term (current) drug therapy: Secondary | ICD-10-CM | POA: Diagnosis not present

## 2012-11-28 DIAGNOSIS — I1310 Hypertensive heart and chronic kidney disease without heart failure, with stage 1 through stage 4 chronic kidney disease, or unspecified chronic kidney disease: Secondary | ICD-10-CM | POA: Diagnosis not present

## 2012-11-28 DIAGNOSIS — I129 Hypertensive chronic kidney disease with stage 1 through stage 4 chronic kidney disease, or unspecified chronic kidney disease: Secondary | ICD-10-CM | POA: Diagnosis not present

## 2012-11-28 NOTE — Progress Notes (Signed)
Vascular and Vein Specialist of [facility]  Patient name: Sean Saunders MRN: [id number] DOB: 24-Jun-1946 Sex: male  REASON FOR VISIT: follow up of left leg wound appeared  HPI: [name] is a 66 y.o. male he was undergone previous aortobifemoral bypass graft, left femoropopliteal bypass graft and revision of left femoropopliteal bypass graft. The most recent procedure was in July of 2013. He developed a wound problem was seen by Kallie Edward, NP and was set up for a follow up visit. He states that the incision that he was having problems with in the distal left leg is healed. He does have some eczema. He denies claudication. He denies rest pain  Unfortunately, since I saw him last he lost his wife to pulmonary disease. He is struggling with this. I've encouraged him to stay active but to walk as much as possible. Fortunately he is not a smoker.   REVIEW OF SYSTEMS: Arly.Keller ] denotes positive finding; [  ] denotes negative finding  CARDIOVASCULAR:  [ ]  chest pain   [ ]  dyspnea on exertion    CONSTITUTIONAL:  [ ]  fever   [ ]  chills  PHYSICAL EXAM: Filed Vitals:   11/28/12 0914  BP: 153/60  Pulse: 60  Temp: 98.2 F (36.8 C)  TempSrc: Oral  Height: 6' (1.829 m)  Weight: 229 lb 9.6 oz (104.146 kg)  SpO2: 100%   Body mass index is 31.14 kg/(m^2). GENERAL: The patient is a well-nourished male, in no acute distress. The vital signs are documented above. CARDIOVASCULAR: There is a regular rate and rhythm  PULMONARY: There is good air exchange bilaterally without wheezing or rales. His incisions have healed. He has palpable femoral popliteal and posterior tibial pulses bilaterally.  MEDICAL ISSUES: Overall pleased with his progress and his bypass graft in the left leg is working well. I've ordered a duplex scan of his graft in 6 months with ABIs. I'll see him back at that time. He knows to call sooner if he has problems.  Kristilyn Coltrane S [facility] of  [place] Beeper: [phone]

## 2012-11-29 NOTE — Addendum Note (Signed)
Addended by: Sharee Pimple on: 11/29/2012 09:34 AM   Modules accepted: Orders

## 2013-02-04 DIAGNOSIS — N182 Chronic kidney disease, stage 2 (mild): Secondary | ICD-10-CM | POA: Diagnosis not present

## 2013-02-04 DIAGNOSIS — E1129 Type 2 diabetes mellitus with other diabetic kidney complication: Secondary | ICD-10-CM | POA: Diagnosis not present

## 2013-02-04 DIAGNOSIS — Z79899 Other long term (current) drug therapy: Secondary | ICD-10-CM | POA: Diagnosis not present

## 2013-02-04 DIAGNOSIS — R21 Rash and other nonspecific skin eruption: Secondary | ICD-10-CM | POA: Diagnosis not present

## 2013-02-04 DIAGNOSIS — E1165 Type 2 diabetes mellitus with hyperglycemia: Secondary | ICD-10-CM | POA: Diagnosis not present

## 2013-02-04 DIAGNOSIS — I129 Hypertensive chronic kidney disease with stage 1 through stage 4 chronic kidney disease, or unspecified chronic kidney disease: Secondary | ICD-10-CM | POA: Diagnosis not present

## 2013-02-06 ENCOUNTER — Ambulatory Visit: Payer: Medicare Other | Admitting: Vascular Surgery

## 2013-02-22 DIAGNOSIS — E109 Type 1 diabetes mellitus without complications: Secondary | ICD-10-CM | POA: Diagnosis not present

## 2013-02-22 DIAGNOSIS — L608 Other nail disorders: Secondary | ICD-10-CM | POA: Diagnosis not present

## 2013-02-22 DIAGNOSIS — M79609 Pain in unspecified limb: Secondary | ICD-10-CM | POA: Diagnosis not present

## 2013-03-11 DIAGNOSIS — L259 Unspecified contact dermatitis, unspecified cause: Secondary | ICD-10-CM | POA: Diagnosis not present

## 2013-03-11 DIAGNOSIS — L219 Seborrheic dermatitis, unspecified: Secondary | ICD-10-CM | POA: Diagnosis not present

## 2013-03-13 DIAGNOSIS — E7800 Pure hypercholesterolemia, unspecified: Secondary | ICD-10-CM | POA: Diagnosis not present

## 2013-03-13 DIAGNOSIS — Z79899 Other long term (current) drug therapy: Secondary | ICD-10-CM | POA: Diagnosis not present

## 2013-03-13 DIAGNOSIS — I10 Essential (primary) hypertension: Secondary | ICD-10-CM | POA: Diagnosis not present

## 2013-03-13 DIAGNOSIS — E1129 Type 2 diabetes mellitus with other diabetic kidney complication: Secondary | ICD-10-CM | POA: Diagnosis not present

## 2013-03-13 DIAGNOSIS — I129 Hypertensive chronic kidney disease with stage 1 through stage 4 chronic kidney disease, or unspecified chronic kidney disease: Secondary | ICD-10-CM | POA: Diagnosis not present

## 2013-03-13 DIAGNOSIS — R0602 Shortness of breath: Secondary | ICD-10-CM | POA: Diagnosis not present

## 2013-03-13 DIAGNOSIS — N182 Chronic kidney disease, stage 2 (mild): Secondary | ICD-10-CM | POA: Diagnosis not present

## 2013-03-13 DIAGNOSIS — E669 Obesity, unspecified: Secondary | ICD-10-CM | POA: Diagnosis not present

## 2013-04-08 DIAGNOSIS — L259 Unspecified contact dermatitis, unspecified cause: Secondary | ICD-10-CM | POA: Diagnosis not present

## 2013-05-28 ENCOUNTER — Encounter: Payer: Self-pay | Admitting: Vascular Surgery

## 2013-05-29 ENCOUNTER — Ambulatory Visit (INDEPENDENT_AMBULATORY_CARE_PROVIDER_SITE_OTHER): Payer: Medicare Other | Admitting: Vascular Surgery

## 2013-05-29 ENCOUNTER — Encounter (INDEPENDENT_AMBULATORY_CARE_PROVIDER_SITE_OTHER): Payer: Medicare Other | Admitting: *Deleted

## 2013-05-29 ENCOUNTER — Encounter: Payer: Self-pay | Admitting: Vascular Surgery

## 2013-05-29 ENCOUNTER — Other Ambulatory Visit: Payer: Self-pay | Admitting: *Deleted

## 2013-05-29 VITALS — BP 141/77 | HR 58 | Resp 16 | Ht 72.0 in | Wt 224.0 lb

## 2013-05-29 DIAGNOSIS — I739 Peripheral vascular disease, unspecified: Secondary | ICD-10-CM

## 2013-05-29 DIAGNOSIS — I70219 Atherosclerosis of native arteries of extremities with intermittent claudication, unspecified extremity: Secondary | ICD-10-CM

## 2013-05-29 DIAGNOSIS — Z48812 Encounter for surgical aftercare following surgery on the circulatory system: Secondary | ICD-10-CM

## 2013-05-29 NOTE — Progress Notes (Signed)
Vascular and Vein Specialist of [facility]  Patient name: Sean Saunders MRN: [id number] DOB: 10-31-1946 Sex: male  REASON FOR VISIT: follow after left fem-fem pop bypass graft and previous aortobifemoral bypass graft.  HPI: FRANCISO DIERKS is a 67 y.o. male who underwent an aortobifemoral bypass graft in 2006. Most recently he underwent a left femoral to below knee popliteal artery bypass graft on 08/11/2011. He comes in for a routine follow up visit. He denies claudication in either lower extremity. He denies rest pain or nonhealing ulcers. There have been no significant changes in his medical history. He is not smoking.  Past Medical History  Diagnosis Date  . Hypertension   . Diabetes mellitus   . COPD (chronic obstructive pulmonary disease)   . Hyperlipidemia   . Thyroid disease   . TIA (transient ischemic attack) 08/03/2011    Left sided weakness. 3 episodes Feb-March  . TIA (transient ischemic attack)     x 3  . Bronchitis     hx of  . Hypothyroidism   . GERD (gastroesophageal reflux disease)   . DVT (deep venous thrombosis)   . Stroke     TIA's 01/05/11-01/08/11-01/26/11   Family History  Problem Relation Age of Onset  . Stroke Mother   . Diabetes Mother   . Other Father     alzheimers  . Diabetes Father   . Stroke Father   . Diabetes Sister    SOCIAL HISTORY: History  Substance Use Topics  . Smoking status: Former Smoker -- 0.50 packs/day for 5 years    Types: Cigarettes    Quit date: 08/02/2011  . Smokeless tobacco: Never Used  . Alcohol Use: Yes     Comment: rare   Allergies  Allergen Reactions  . Eszopiclone Itching  . Niaspan (Niacin Er) Nausea And Vomiting and Rash   Current Outpatient Prescriptions  Medication Sig Dispense Refill  . amLODipine (NORVASC) 10 MG tablet Take 10 mg by mouth daily.      . clopidogrel (PLAVIX) 75 MG tablet Take 75 mg by mouth daily.        . furosemide (LASIX) 20 MG tablet Take 20 mg by mouth daily.       Marland Kitchen gabapentin  (NEURONTIN) 600 MG tablet Take 600 mg by mouth 2 (two) times daily.       . insulin regular human CONCENTRATED (HUMULIN R) 500 UNIT/ML SOLN injection Inject 20 Units into the skin. Patient's home scale: CBB>300: 30 units CBG 250-300: 25 units CBG 200-250: 20 units CBG 150-200: 15 units CBG 100-150: 10 units CBG <100: hold dose      . levothyroxine (SYNTHROID, LEVOTHROID) 137 MCG tablet Take 137 mcg by mouth daily.        . metFORMIN (GLUCOPHAGE XR) 500 MG 24 hr tablet Take 1,000 mg by mouth 3 (three) times daily.       . nebivolol (BYSTOLIC) 5 MG tablet Take 5 mg by mouth daily.      Marland Kitchen omeprazole (PRILOSEC) 20 MG capsule Take 20 mg by mouth 2 (two) times daily.      . rosuvastatin (CRESTOR) 10 MG tablet Take 20 mg by mouth daily.       . sitaGLIPtin (JANUVIA) 100 MG tablet Take 100 mg by mouth daily.        . valsartan (DIOVAN) 320 MG tablet Take 320 mg by mouth daily.      . ciprofloxacin (CIPRO) 500 MG tablet Take 1 tablet (500 mg total) by mouth 2 (  two) times daily.  28 tablet  0  . ciprofloxacin (CIPRO) 500 MG tablet Take 1 tablet (500 mg total) by mouth 2 (two) times daily.  20 tablet  0  . Fluticasone-Salmeterol (ADVAIR) 250-50 MCG/DOSE AEPB Inhale 1 puff into the lungs every 12 (twelve) hours.        . senna-docusate (SENOKOT-S) 8.6-50 MG per tablet Take 1 tablet by mouth at bedtime as needed.      . simvastatin (ZOCOR) 40 MG tablet Take 40 mg by mouth every evening.      . tiotropium (SPIRIVA) 18 MCG inhalation capsule Place 18 mcg into inhaler and inhale daily.         No current facility-administered medications for this visit.   REVIEW OF SYSTEMS: Arly.Keller ] denotes positive finding; [  ] denotes negative finding  CARDIOVASCULAR:  [ ]  chest pain   [ ]  chest pressure   [ ]  palpitations   [ ]  orthopnea   [ ]  dyspnea on exertion   [ ]  claudication   [ ]  rest pain   [ ]  DVT   [ ]  phlebitis PULMONARY:   [ ]  productive cough   [ ]  asthma   [ ]  wheezing NEUROLOGIC:   [ ]  weakness  [ ]   paresthesias  [ ]  aphasia  [ ]  amaurosis  [ ]  dizziness HEMATOLOGIC:   [ ]  bleeding problems   [ ]  clotting disorders MUSCULOSKELETAL:  [ ]  joint pain   [ ]  joint swelling Arly.Keller ] leg swelling GASTROINTESTINAL: [ ]   blood in stool  [ ]   hematemesis GENITOURINARY:  [ ]   dysuria  [ ]   hematuria PSYCHIATRIC:  [ ]  history of major depression INTEGUMENTARY:  [ ]  rashes  [ ]  ulcers CONSTITUTIONAL:  [ ]  fever   [ ]  chills  PHYSICAL EXAM: Filed Vitals:   05/29/13 1052  BP: 141/77  Pulse: 58  Resp: 16  Height: 6' (1.829 m)  Weight: 224 lb (101.606 kg)  SpO2: 97%   Body mass index is 30.37 kg/(m^2). GENERAL: The patient is a well-nourished male, in no acute distress. The vital signs are documented above. CARDIOVASCULAR: There is a regular rate and rhythm. I do not detect carotid bruits. He has palpable femoral pulses. He has a palpable left popliteal and left posterior tibial pulse. He has a palpable right posterior tibial pulse. He has no significant lower extremity swelling. PULMONARY: There is good air exchange bilaterally without wheezing or rales. ABDOMEN: Soft and non-tender with normal pitched bowel sounds.  MUSCULOSKELETAL: There are no major deformities or cyanosis. NEUROLOGIC: No focal weakness or paresthesias are detected. SKIN: There are no ulcers or rashes noted. PSYCHIATRIC: The patient has a normal affect.  DATA:  I have independently interpreted his lower extremity arterial duplex scan of his left femoral to below knee popliteal artery bypass graft which shows biphasic waveforms throughout. There were no areas of stenosis within the graft. I have independently interpreted his arterial Doppler study which shows an ABI of 99% on the right. She was up from 73% on 05/23/2012.  VASCULAR QUALITY INITIATIVE FOLLOW UP DATA:  Current smoker: [  ] yes  [ X ] no  Living status: Arly.Keller  ]  Home  [  ] Nursing home  [  ] Homeless    MEDS:  ASA [  ] yes  [  ] no- [ X ] medical reason  [  ]  non compliant  STATIN  Arly.Keller  ] yes  [  ]  no- [  ] medical reason  [  ] non compliant  Beta blocker [ X ] yes  [  ] no- [  ] medical reason  [  ] non compliant  ACE inhibitor [  ] yes  [  ] no- [ X] medical reason  [  ] non compliant  P2Y12 Antagonist [  ] none  [ X ] clopidogrel-Plavix  [  ] ticlopidine-Ticlid   [  ] prasugrel-Effient  [  ] ticagrelor- Brilinta    Anticoagulant [ X ] None  [  ] warfarin  [  ] rivaroxaban-Xarelto [  ] dabigatran- Pradaxa  Ambulation: [ X ] Amb  [  ] Amb with assistance  [  ] wheelchair  [  ] bedridden  Ipsilateral Sx: [ X ] none   [  ] claudication  [  ] rest pain  [  ] tissue loss  Current Patency: Arly.Keller  ] primary  [  ] primary-assisted  [  ] secondary  [  ] occluded  Patency judged by: Arly.Keller  ] doppler  [  ] palp graft pulse  [  ] palp distal pulse   [  ] ABI increase > 15%   [ X ] Duplex  If occluded, when-   Ipsilateral ABI: 99%  Ipsilateral TBI: N/A  Infection: [ X ] none  [  ] cellulitis  [  ] deep abscess  [  ] infection of artery or graft  Bypass revision: [ X ] no  [  ] yes- [  ] surg  [  ] catheter based  [  ] both    Date:   Thrombectomy/ lysis/ revision:  [ X ] no  [  ] yes- [  ] surg  [  ] catheter based  [  ] both    Date:   Major amputation: Arly.Keller  ] no  [  ] minor amp  [  ] BKA  [  ] AKA   Date:   MEDICAL ISSUES: PERIPHERAL VASCULAR DISEASE: His left femoral to below knee pop 2 artery bypass graft is functioning well without areas of stenosis. He is not smoking. I have encouraged him to stay as active as possible. Border follow up duplex of his graft in 6 months and also ABIs at that time. He is to call sooner if he has problems.  CAROTID DISEASE: He does have a history of some mild carotid disease. He is asymptomatic from that standpoint without history of stroke TIAs or amaurosis fugax. I've ordered a follow up carotid duplex scan when he comes in 6 months.  DICKSON,CHRISTOPHER S [facility] [place] [facility]:  [phone]

## 2013-06-06 DIAGNOSIS — E299 Testicular dysfunction, unspecified: Secondary | ICD-10-CM | POA: Diagnosis not present

## 2013-06-06 DIAGNOSIS — I129 Hypertensive chronic kidney disease with stage 1 through stage 4 chronic kidney disease, or unspecified chronic kidney disease: Secondary | ICD-10-CM | POA: Diagnosis not present

## 2013-06-06 DIAGNOSIS — Z125 Encounter for screening for malignant neoplasm of prostate: Secondary | ICD-10-CM | POA: Diagnosis not present

## 2013-06-06 DIAGNOSIS — Z1212 Encounter for screening for malignant neoplasm of rectum: Secondary | ICD-10-CM | POA: Diagnosis not present

## 2013-06-06 DIAGNOSIS — E1129 Type 2 diabetes mellitus with other diabetic kidney complication: Secondary | ICD-10-CM | POA: Diagnosis not present

## 2013-06-06 DIAGNOSIS — Z0000 Encounter for general adult medical examination without abnormal findings: Secondary | ICD-10-CM | POA: Diagnosis not present

## 2013-06-06 DIAGNOSIS — E1165 Type 2 diabetes mellitus with hyperglycemia: Secondary | ICD-10-CM | POA: Diagnosis not present

## 2013-06-06 DIAGNOSIS — J449 Chronic obstructive pulmonary disease, unspecified: Secondary | ICD-10-CM | POA: Diagnosis not present

## 2013-06-06 DIAGNOSIS — Z0100 Encounter for examination of eyes and vision without abnormal findings: Secondary | ICD-10-CM | POA: Diagnosis not present

## 2013-06-06 DIAGNOSIS — N182 Chronic kidney disease, stage 2 (mild): Secondary | ICD-10-CM | POA: Diagnosis not present

## 2013-06-07 ENCOUNTER — Encounter: Payer: Self-pay | Admitting: Podiatry

## 2013-06-07 ENCOUNTER — Ambulatory Visit (INDEPENDENT_AMBULATORY_CARE_PROVIDER_SITE_OTHER): Payer: Medicare Other | Admitting: Podiatry

## 2013-06-07 VITALS — BP 148/99 | HR 62 | Wt 220.0 lb

## 2013-06-07 DIAGNOSIS — M25579 Pain in unspecified ankle and joints of unspecified foot: Secondary | ICD-10-CM

## 2013-06-07 DIAGNOSIS — B351 Tinea unguium: Secondary | ICD-10-CM

## 2013-06-07 DIAGNOSIS — L84 Corns and callosities: Secondary | ICD-10-CM | POA: Insufficient documentation

## 2013-06-07 HISTORY — DX: Tinea unguium: B35.1

## 2013-06-07 NOTE — Progress Notes (Signed)
Subjective: 67 y.o. year old male patient presents complaining of painful nails. Patient requests toe nails, corns and calluses trimmed.  Stated that he is doing better with blood sugar level.   Objective: Dermatologic: Thick yellow deformed nails both great toes.  Thick broad calluses plantar medial aspect both feet, under the first MPJ both feet. Vascular: Pedal pulses are all palpable. Multiple varicose vein surgeries. Orthopedic: Rectus foot without gross deformity. Neurologic: All epicritic and tactile sensations grossly intact.  Assessment: Dystrophic mycotic nails x 10. Hyperkeratosis both heels and first MPJ.  Treatment: All mycotic nails, corns, calluses debrided.  Return in 3 months or as needed.

## 2013-06-11 DIAGNOSIS — E875 Hyperkalemia: Secondary | ICD-10-CM | POA: Diagnosis not present

## 2013-06-11 DIAGNOSIS — E559 Vitamin D deficiency, unspecified: Secondary | ICD-10-CM | POA: Diagnosis not present

## 2013-07-30 ENCOUNTER — Other Ambulatory Visit (INDEPENDENT_AMBULATORY_CARE_PROVIDER_SITE_OTHER): Payer: Self-pay | Admitting: Otolaryngology

## 2013-07-30 DIAGNOSIS — H933X2 Disorders of left acoustic nerve: Secondary | ICD-10-CM

## 2013-07-31 ENCOUNTER — Other Ambulatory Visit (INDEPENDENT_AMBULATORY_CARE_PROVIDER_SITE_OTHER): Payer: Self-pay | Admitting: Otolaryngology

## 2013-07-31 DIAGNOSIS — H933X9 Disorders of unspecified acoustic nerve: Secondary | ICD-10-CM | POA: Diagnosis not present

## 2013-07-31 LAB — CREATININE, SERUM: Creat: 1.26 mg/dL (ref 0.50–1.35)

## 2013-08-02 ENCOUNTER — Ambulatory Visit
Admission: RE | Admit: 2013-08-02 | Discharge: 2013-08-02 | Disposition: A | Discharge status: Home / Self Care | Payer: Medicare Other | Source: Ambulatory Visit | Attending: Otolaryngology | Admitting: Otolaryngology

## 2013-08-02 DIAGNOSIS — D320 Benign neoplasm of cerebral meninges: Secondary | ICD-10-CM | POA: Diagnosis not present

## 2013-08-02 DIAGNOSIS — H933X2 Disorders of left acoustic nerve: Secondary | ICD-10-CM

## 2013-08-02 DIAGNOSIS — D332 Benign neoplasm of brain, unspecified: Secondary | ICD-10-CM | POA: Diagnosis not present

## 2013-08-02 MED ORDER — GADOBENATE DIMEGLUMINE 529 MG/ML IV SOLN
20.0000 mL | Freq: Once | INTRAVENOUS | Status: AC | PRN
  Administered 2013-08-02: 20 mL via INTRAVENOUS

## 2013-08-13 DIAGNOSIS — E119 Type 2 diabetes mellitus without complications: Secondary | ICD-10-CM | POA: Diagnosis not present

## 2013-08-13 DIAGNOSIS — H903 Sensorineural hearing loss, bilateral: Secondary | ICD-10-CM | POA: Diagnosis not present

## 2013-08-13 DIAGNOSIS — R42 Dizziness and giddiness: Secondary | ICD-10-CM | POA: Diagnosis not present

## 2013-08-13 DIAGNOSIS — D333 Benign neoplasm of cranial nerves: Secondary | ICD-10-CM | POA: Diagnosis not present

## 2013-08-15 DIAGNOSIS — IMO0001 Reserved for inherently not codable concepts without codable children: Secondary | ICD-10-CM | POA: Diagnosis not present

## 2013-08-15 DIAGNOSIS — E299 Testicular dysfunction, unspecified: Secondary | ICD-10-CM | POA: Diagnosis not present

## 2013-08-15 DIAGNOSIS — D496 Neoplasm of unspecified behavior of brain: Secondary | ICD-10-CM | POA: Diagnosis not present

## 2013-08-15 DIAGNOSIS — N183 Chronic kidney disease, stage 3 unspecified: Secondary | ICD-10-CM | POA: Diagnosis not present

## 2013-08-15 DIAGNOSIS — I1310 Hypertensive heart and chronic kidney disease without heart failure, with stage 1 through stage 4 chronic kidney disease, or unspecified chronic kidney disease: Secondary | ICD-10-CM | POA: Diagnosis not present

## 2013-08-15 DIAGNOSIS — E1129 Type 2 diabetes mellitus with other diabetic kidney complication: Secondary | ICD-10-CM | POA: Diagnosis not present

## 2013-08-30 DIAGNOSIS — D320 Benign neoplasm of cerebral meninges: Secondary | ICD-10-CM | POA: Diagnosis not present

## 2013-08-30 DIAGNOSIS — D333 Benign neoplasm of cranial nerves: Secondary | ICD-10-CM | POA: Diagnosis not present

## 2013-09-06 ENCOUNTER — Ambulatory Visit (INDEPENDENT_AMBULATORY_CARE_PROVIDER_SITE_OTHER): Payer: Medicare Other | Admitting: Podiatry

## 2013-09-06 ENCOUNTER — Encounter: Payer: Self-pay | Admitting: Podiatry

## 2013-09-06 VITALS — BP 141/102 | HR 60 | Ht 72.0 in | Wt 213.0 lb

## 2013-09-06 DIAGNOSIS — L84 Corns and callosities: Secondary | ICD-10-CM

## 2013-09-06 DIAGNOSIS — E1159 Type 2 diabetes mellitus with other circulatory complications: Secondary | ICD-10-CM

## 2013-09-06 DIAGNOSIS — B351 Tinea unguium: Secondary | ICD-10-CM

## 2013-09-06 DIAGNOSIS — M25579 Pain in unspecified ankle and joints of unspecified foot: Secondary | ICD-10-CM

## 2013-09-06 NOTE — Patient Instructions (Addendum)
Seen for painful calluses. No new problems noted. All calluses and nails debrided. Return as needed.

## 2013-09-06 NOTE — Progress Notes (Signed)
3 month diabetic foot care. Presents complaining of painful callus.  Objective: Plantar calluses on both heels and under the first MPJ bilateral. Nails are hypertrophic. All pedal pulses are palpable except left dorsalis pedis.    Assessment: Plantar hyperkeratosis both heels and under the first MPJ. Mycotic nails x 10.  Plan: Debrided all calluses and nails.

## 2013-10-07 DIAGNOSIS — N182 Chronic kidney disease, stage 2 (mild): Secondary | ICD-10-CM | POA: Diagnosis not present

## 2013-10-07 DIAGNOSIS — N055 Unspecified nephritic syndrome with diffuse mesangiocapillary glomerulonephritis: Secondary | ICD-10-CM | POA: Diagnosis not present

## 2013-10-07 DIAGNOSIS — E1142 Type 2 diabetes mellitus with diabetic polyneuropathy: Secondary | ICD-10-CM | POA: Diagnosis not present

## 2013-10-07 DIAGNOSIS — N058 Unspecified nephritic syndrome with other morphologic changes: Secondary | ICD-10-CM | POA: Diagnosis not present

## 2013-10-07 DIAGNOSIS — Z7902 Long term (current) use of antithrombotics/antiplatelets: Secondary | ICD-10-CM | POA: Diagnosis not present

## 2013-10-07 DIAGNOSIS — E1149 Type 2 diabetes mellitus with other diabetic neurological complication: Secondary | ICD-10-CM | POA: Diagnosis not present

## 2013-10-07 DIAGNOSIS — I129 Hypertensive chronic kidney disease with stage 1 through stage 4 chronic kidney disease, or unspecified chronic kidney disease: Secondary | ICD-10-CM | POA: Diagnosis not present

## 2013-10-07 DIAGNOSIS — IMO0001 Reserved for inherently not codable concepts without codable children: Secondary | ICD-10-CM | POA: Diagnosis not present

## 2013-10-07 DIAGNOSIS — I2510 Atherosclerotic heart disease of native coronary artery without angina pectoris: Secondary | ICD-10-CM | POA: Diagnosis not present

## 2013-10-07 DIAGNOSIS — E1129 Type 2 diabetes mellitus with other diabetic kidney complication: Secondary | ICD-10-CM | POA: Diagnosis not present

## 2013-10-07 DIAGNOSIS — Z006 Encounter for examination for normal comparison and control in clinical research program: Secondary | ICD-10-CM | POA: Diagnosis not present

## 2013-10-08 DIAGNOSIS — R109 Unspecified abdominal pain: Secondary | ICD-10-CM | POA: Diagnosis not present

## 2013-12-09 ENCOUNTER — Encounter: Payer: Self-pay | Admitting: Podiatry

## 2013-12-09 ENCOUNTER — Ambulatory Visit (INDEPENDENT_AMBULATORY_CARE_PROVIDER_SITE_OTHER): Payer: Medicare Other | Admitting: Podiatry

## 2013-12-09 VITALS — BP 167/77 | HR 67 | Ht 72.0 in | Wt 195.0 lb

## 2013-12-09 DIAGNOSIS — M25579 Pain in unspecified ankle and joints of unspecified foot: Secondary | ICD-10-CM

## 2013-12-09 DIAGNOSIS — B351 Tinea unguium: Secondary | ICD-10-CM | POA: Diagnosis not present

## 2013-12-09 DIAGNOSIS — L84 Corns and callosities: Secondary | ICD-10-CM

## 2013-12-09 DIAGNOSIS — G5790 Unspecified mononeuropathy of unspecified lower limb: Secondary | ICD-10-CM

## 2013-12-09 DIAGNOSIS — I739 Peripheral vascular disease, unspecified: Secondary | ICD-10-CM

## 2013-12-09 DIAGNOSIS — M792 Neuralgia and neuritis, unspecified: Secondary | ICD-10-CM

## 2013-12-09 NOTE — Progress Notes (Signed)
Subjective: 67 year old male presents for diabetic foot care. Stated that he is on a new diet and exercise regime, and lost 40 lbs. Stated that he is feeling good but still having foot pain from neuropathy. He is taking Lyrica.   Objective: Plantar calluses on first MPJ bilateral.  Nails are hypertrophic.  All pedal pulses are palpable except left dorsalis pedis.   Assessment: Plantar hyperkeratosis under the first MPJ.  Mycotic nails x 10.   Plan: Debrided all calluses and nails.

## 2013-12-09 NOTE — Patient Instructions (Signed)
Seen for hypertrophic nails. All nails debrided. Return in 3 months or as needed.  

## 2013-12-10 ENCOUNTER — Encounter: Payer: Self-pay | Admitting: Vascular Surgery

## 2013-12-11 ENCOUNTER — Encounter (HOSPITAL_COMMUNITY): Payer: Medicare Other

## 2013-12-11 ENCOUNTER — Ambulatory Visit: Payer: Medicare Other | Admitting: Vascular Surgery

## 2013-12-11 ENCOUNTER — Ambulatory Visit (HOSPITAL_COMMUNITY)
Admission: RE | Admit: 2013-12-11 | Discharge: 2013-12-11 | Disposition: A | Discharge status: Home / Self Care | Payer: Medicare Other | Source: Ambulatory Visit | Attending: Vascular Surgery | Admitting: Vascular Surgery

## 2013-12-11 ENCOUNTER — Other Ambulatory Visit (HOSPITAL_COMMUNITY): Payer: Medicare Other

## 2013-12-11 ENCOUNTER — Ambulatory Visit (INDEPENDENT_AMBULATORY_CARE_PROVIDER_SITE_OTHER): Payer: Medicare Other | Admitting: Vascular Surgery

## 2013-12-11 ENCOUNTER — Ambulatory Visit (INDEPENDENT_AMBULATORY_CARE_PROVIDER_SITE_OTHER)
Admission: RE | Admit: 2013-12-11 | Discharge: 2013-12-11 | Disposition: A | Discharge status: Home / Self Care | Payer: Medicare Other | Source: Ambulatory Visit | Attending: Vascular Surgery | Admitting: Vascular Surgery

## 2013-12-11 ENCOUNTER — Encounter: Payer: Self-pay | Admitting: Vascular Surgery

## 2013-12-11 VITALS — BP 151/76 | HR 61 | Resp 16 | Ht 72.0 in | Wt 195.0 lb

## 2013-12-11 DIAGNOSIS — Z48812 Encounter for surgical aftercare following surgery on the circulatory system: Secondary | ICD-10-CM | POA: Diagnosis not present

## 2013-12-11 DIAGNOSIS — I739 Peripheral vascular disease, unspecified: Secondary | ICD-10-CM | POA: Diagnosis not present

## 2013-12-11 DIAGNOSIS — I6529 Occlusion and stenosis of unspecified carotid artery: Secondary | ICD-10-CM

## 2013-12-11 DIAGNOSIS — I658 Occlusion and stenosis of other precerebral arteries: Secondary | ICD-10-CM | POA: Diagnosis not present

## 2013-12-11 NOTE — Assessment & Plan Note (Signed)
His aortobifemoral bypass graft and left femoropopliteal bypass graft is functioning nicely with normal flow in both feet and palpable posterior tibial pulses. I've encouraged him to stay as active as possible. He has done very well his new diet and has lost 40 pounds. He is no longer a smoker. I've ordered follow up ABIs in the graft duplex in 1 year. He knows to call sooner if he has problems.

## 2013-12-11 NOTE — Addendum Note (Signed)
Addended by: Sharee Pimple on: 12/11/2013 12:30 PM   Modules accepted: Orders

## 2013-12-11 NOTE — Progress Notes (Signed)
Vascular and Vein Specialist of [facility]  Patient name: Sean Saunders MRN: [id number] DOB: 12-13-1945 Sex: male  REASON FOR VISIT: Follow up of carotid disease and peripheral vascular disease.  HPI: Sean Saunders is a 67 y.o. male who comes in for a 6 month follow up visit. He underwent aortobifemoral bypass graft in 2006. He had a left femoral to below knee popliteal artery bypass graft in August of 2012. He also has some mild carotid disease. He comes in for a 6 month follow up visit.  Since I saw him last, he denies any history of stroke, TIAs, expressive or receptive aphasia, or amaurosis fugax.  With respect to his peripheral vascular disease, he denies any history of claudication, rest pain, or nonhealing ulcers.  He has lost 40 pounds eating only meat and vegetables. He has not out all of the carbs and starches.   Past Medical History  Diagnosis Date  . Hypertension   . Diabetes mellitus   . COPD (chronic obstructive pulmonary disease)   . Hyperlipidemia   . Thyroid disease   . TIA (transient ischemic attack) 08/03/2011    Left sided weakness. 3 episodes Feb-March  . TIA (transient ischemic attack)     x 3  . Bronchitis     hx of  . Hypothyroidism   . GERD (gastroesophageal reflux disease)   . DVT (deep venous thrombosis)   . Stroke     TIA's 01/05/11-01/08/11-01/26/11   Family History  Problem Relation Age of Onset  . Stroke Mother   . Diabetes Mother   . Other Father     alzheimers  . Diabetes Father   . Stroke Father   . Diabetes Sister    SOCIAL HISTORY: History  Substance Use Topics  . Smoking status: Former Smoker -- 0.50 packs/day for 5 years    Types: Cigarettes    Quit date: 08/02/2011  . Smokeless tobacco: Never Used  . Alcohol Use: Yes     Comment: rare   Allergies  Allergen Reactions  . Eszopiclone Itching  . Niaspan [Niacin Er] Nausea And Vomiting and Rash   Current Outpatient Prescriptions  Medication Sig Dispense Refill  .  ALBUTEROL IN Inhale into the lungs daily.      Marland Kitchen ALPRAZolam (XANAX) 0.5 MG tablet Take 0.5 mg by mouth at bedtime as needed for anxiety.      Marland Kitchen amLODipine (NORVASC) 10 MG tablet Take 10 mg by mouth daily.      . budesonide-formoterol (SYMBICORT) 160-4.5 MCG/ACT inhaler Inhale 2 puffs into the lungs as needed.      . clopidogrel (PLAVIX) 75 MG tablet Take 75 mg by mouth daily.        . Fluticasone-Salmeterol (ADVAIR) 250-50 MCG/DOSE AEPB Inhale 1 puff into the lungs every 12 (twelve) hours.        . furosemide (LASIX) 20 MG tablet Take 20 mg by mouth daily.       Marland Kitchen gabapentin (NEURONTIN) 600 MG tablet Take 600 mg by mouth 2 (two) times daily.       . insulin regular human CONCENTRATED (HUMULIN R) 500 UNIT/ML SOLN injection Inject 20 Units into the skin. Patient's home scale: CBB>300: 30 units CBG 250-300: 25 units CBG 200-250: 20 units CBG 150-200: 15 units CBG 100-150: 10 units CBG <100: hold dose      . levothyroxine (SYNTHROID, LEVOTHROID) 137 MCG tablet Take 137 mcg by mouth daily.        . metFORMIN (GLUCOPHAGE XR) 500  MG 24 hr tablet Take 1,000 mg by mouth 3 (three) times daily.       . nebivolol (BYSTOLIC) 5 MG tablet Take 5 mg by mouth daily.      Marland Kitchen omeprazole (PRILOSEC) 20 MG capsule Take 20 mg by mouth 2 (two) times daily.      . rosuvastatin (CRESTOR) 10 MG tablet Take 20 mg by mouth daily.       . sitaGLIPtin (JANUVIA) 100 MG tablet Take 100 mg by mouth daily.        Marland Kitchen tiotropium (SPIRIVA) 18 MCG inhalation capsule Place 18 mcg into inhaler and inhale daily.        . valsartan (DIOVAN) 320 MG tablet Take 320 mg by mouth daily.      . ciprofloxacin (CIPRO) 500 MG tablet Take 1 tablet (500 mg total) by mouth 2 (two) times daily.  28 tablet  0  . ciprofloxacin (CIPRO) 500 MG tablet Take 1 tablet (500 mg total) by mouth 2 (two) times daily.  20 tablet  0  . simvastatin (ZOCOR) 40 MG tablet Take 40 mg by mouth every evening.       No current facility-administered medications for this  visit.   REVIEW OF SYSTEMS: Arly.Keller ] denotes positive finding; [  ] denotes negative finding  CARDIOVASCULAR:  [ ]  chest pain   [ ]  chest pressure   [ ]  palpitations   [ ]  orthopnea   Arly.Keller ] dyspnea on exertion   [ ]  claudication   [ ]  rest pain   [ ]  DVT   [ ]  phlebitis PULMONARY:   [ ]  productive cough   [ ]  asthma   [ ]  wheezing NEUROLOGIC:   [ ]  weakness  Arly.Keller ] paresthesias  [ ]  aphasia  [ ]  amaurosis  [ ]  dizziness HEMATOLOGIC:   [ ]  bleeding problems   [ ]  clotting disorders MUSCULOSKELETAL:  [ ]  joint pain   [ ]  joint swelling [ ]  leg swelling GASTROINTESTINAL: [ ]   blood in stool  [ ]   hematemesis GENITOURINARY:  [ ]   dysuria  [ ]   hematuria PSYCHIATRIC:  [ ]  history of major depression INTEGUMENTARY:  [ ]  rashes  [ ]  ulcers CONSTITUTIONAL:  [ ]  fever   [ ]  chills  PHYSICAL EXAM: Filed Vitals:   12/11/13 1126 12/11/13 1129  BP: 154/76 151/76  Pulse: 64 61  Resp: 16   Height: 6' (1.829 m)   Weight: 195 lb (88.451 kg)    Body mass index is 26.44 kg/(m^2). GENERAL: The patient is a well-nourished male, in no acute distress. The vital signs are documented above. CARDIOVASCULAR: There is a regular rate and rhythm. I do not detect carotid bruits. He has palpable femoral, popliteal, and posterior tibial pulses bilaterally. PULMONARY: There is good air exchange bilaterally without wheezing or rales. ABDOMEN: Soft and non-tender with normal pitched bowel sounds.  MUSCULOSKELETAL: There are no major deformities or cyanosis. NEUROLOGIC: No focal weakness or paresthesias are detected. SKIN: There are no ulcers or rashes noted. PSYCHIATRIC: The patient has a normal affect.  DATA:  I have independently interpreted his carotid duplex scan which shows a less than 40% right carotid stenosis and a 40-59% left carotid stenosis. Both vertebral arteries are patent with antegrade flow.  I have also independently interpreted his arterial Doppler study which shows triphasic Doppler signals in the right  foot and biphasic signals on the left. ABI on the right is 100%. ABI on the left is  99%. Graft duplex shows his graft is widely patent without areas of significantly increased callosity.  MEDICAL ISSUES:  Occlusion and stenosis of carotid artery without mention of cerebral infarction He has an asymptomatic 40-59% left carotid stenosis. He is on aspirin and is also on a statin. I've recommended a follow carotid duplex scan in 1 year now see him back at that time. He knows to call sooner if he has problems.  PVD (peripheral vascular disease) His aortobifemoral bypass graft and left femoropopliteal bypass graft is functioning nicely with normal flow in both feet and palpable posterior tibial pulses. I've encouraged him to stay as active as possible. He has done very well his new diet and has lost 40 pounds. He is no longer a smoker. I've ordered follow up ABIs in the graft duplex in 1 year. He knows to call sooner if he has problems.   Anthone Prieur S [facility] [place] [facility]: [phone]

## 2013-12-11 NOTE — Assessment & Plan Note (Signed)
He has an asymptomatic 40-59% left carotid stenosis. He is on aspirin and is also on a statin. I've recommended a follow carotid duplex scan in 1 year now see him back at that time. He knows to call sooner if he has problems.

## 2013-12-25 DIAGNOSIS — E1129 Type 2 diabetes mellitus with other diabetic kidney complication: Secondary | ICD-10-CM | POA: Diagnosis not present

## 2013-12-25 DIAGNOSIS — E039 Hypothyroidism, unspecified: Secondary | ICD-10-CM | POA: Diagnosis not present

## 2013-12-25 DIAGNOSIS — I129 Hypertensive chronic kidney disease with stage 1 through stage 4 chronic kidney disease, or unspecified chronic kidney disease: Secondary | ICD-10-CM | POA: Diagnosis not present

## 2013-12-25 DIAGNOSIS — J449 Chronic obstructive pulmonary disease, unspecified: Secondary | ICD-10-CM | POA: Diagnosis not present

## 2013-12-25 DIAGNOSIS — N182 Chronic kidney disease, stage 2 (mild): Secondary | ICD-10-CM | POA: Diagnosis not present

## 2014-01-13 DIAGNOSIS — I129 Hypertensive chronic kidney disease with stage 1 through stage 4 chronic kidney disease, or unspecified chronic kidney disease: Secondary | ICD-10-CM | POA: Diagnosis not present

## 2014-01-13 DIAGNOSIS — J449 Chronic obstructive pulmonary disease, unspecified: Secondary | ICD-10-CM | POA: Diagnosis not present

## 2014-01-13 DIAGNOSIS — Z1159 Encounter for screening for other viral diseases: Secondary | ICD-10-CM | POA: Diagnosis not present

## 2014-01-13 DIAGNOSIS — J019 Acute sinusitis, unspecified: Secondary | ICD-10-CM | POA: Diagnosis not present

## 2014-02-27 ENCOUNTER — Encounter: Payer: Self-pay | Admitting: Internal Medicine

## 2014-03-10 ENCOUNTER — Ambulatory Visit: Payer: Medicare Other | Admitting: Podiatry

## 2014-03-21 ENCOUNTER — Encounter: Payer: Self-pay | Admitting: Podiatry

## 2014-03-21 ENCOUNTER — Ambulatory Visit (INDEPENDENT_AMBULATORY_CARE_PROVIDER_SITE_OTHER): Payer: Medicare Other | Admitting: Podiatry

## 2014-03-21 VITALS — BP 136/66 | HR 59 | Ht 72.0 in | Wt 184.0 lb

## 2014-03-21 DIAGNOSIS — E1140 Type 2 diabetes mellitus with diabetic neuropathy, unspecified: Secondary | ICD-10-CM | POA: Insufficient documentation

## 2014-03-21 DIAGNOSIS — E1165 Type 2 diabetes mellitus with hyperglycemia: Secondary | ICD-10-CM

## 2014-03-21 DIAGNOSIS — M79609 Pain in unspecified limb: Secondary | ICD-10-CM | POA: Diagnosis not present

## 2014-03-21 DIAGNOSIS — I739 Peripheral vascular disease, unspecified: Secondary | ICD-10-CM

## 2014-03-21 DIAGNOSIS — B351 Tinea unguium: Secondary | ICD-10-CM

## 2014-03-21 DIAGNOSIS — IMO0002 Reserved for concepts with insufficient information to code with codable children: Secondary | ICD-10-CM

## 2014-03-21 DIAGNOSIS — IMO0001 Reserved for inherently not codable concepts without codable children: Secondary | ICD-10-CM | POA: Diagnosis not present

## 2014-03-21 DIAGNOSIS — M79606 Pain in leg, unspecified: Secondary | ICD-10-CM

## 2014-03-21 NOTE — Patient Instructions (Signed)
Seen for hypertrophic nails. All nails debrided. Return in 3 months or as needed.  

## 2014-03-21 NOTE — Progress Notes (Signed)
Subjective:  68 year old male presents for diabetic foot care.  Stated that he continues with his daily exercise. Still his blood sugar level is about 250.   Objective: Nails are hypertrophic.  All pedal pulses are palpable except left dorsalis pedis.  High arch foot without any gross deformities.   Assessment: Mycotic nails x 10.   Plan: Debrided all and nails.

## 2014-03-26 DIAGNOSIS — E1129 Type 2 diabetes mellitus with other diabetic kidney complication: Secondary | ICD-10-CM | POA: Diagnosis not present

## 2014-03-26 DIAGNOSIS — N182 Chronic kidney disease, stage 2 (mild): Secondary | ICD-10-CM | POA: Diagnosis not present

## 2014-03-26 DIAGNOSIS — R197 Diarrhea, unspecified: Secondary | ICD-10-CM | POA: Diagnosis not present

## 2014-03-26 DIAGNOSIS — I129 Hypertensive chronic kidney disease with stage 1 through stage 4 chronic kidney disease, or unspecified chronic kidney disease: Secondary | ICD-10-CM | POA: Diagnosis not present

## 2014-03-26 DIAGNOSIS — E1165 Type 2 diabetes mellitus with hyperglycemia: Secondary | ICD-10-CM | POA: Diagnosis not present

## 2014-04-21 DIAGNOSIS — J449 Chronic obstructive pulmonary disease, unspecified: Secondary | ICD-10-CM | POA: Diagnosis not present

## 2014-04-21 DIAGNOSIS — I1310 Hypertensive heart and chronic kidney disease without heart failure, with stage 1 through stage 4 chronic kidney disease, or unspecified chronic kidney disease: Secondary | ICD-10-CM | POA: Diagnosis not present

## 2014-04-21 DIAGNOSIS — E1129 Type 2 diabetes mellitus with other diabetic kidney complication: Secondary | ICD-10-CM | POA: Diagnosis not present

## 2014-04-21 DIAGNOSIS — N182 Chronic kidney disease, stage 2 (mild): Secondary | ICD-10-CM | POA: Diagnosis not present

## 2014-05-02 ENCOUNTER — Ambulatory Visit: Payer: Medicare Other | Admitting: Internal Medicine

## 2014-05-07 DIAGNOSIS — R634 Abnormal weight loss: Secondary | ICD-10-CM | POA: Diagnosis not present

## 2014-05-07 DIAGNOSIS — R197 Diarrhea, unspecified: Secondary | ICD-10-CM | POA: Diagnosis not present

## 2014-05-07 DIAGNOSIS — E119 Type 2 diabetes mellitus without complications: Secondary | ICD-10-CM | POA: Diagnosis not present

## 2014-06-20 ENCOUNTER — Encounter: Payer: Self-pay | Admitting: Podiatry

## 2014-06-20 ENCOUNTER — Ambulatory Visit (INDEPENDENT_AMBULATORY_CARE_PROVIDER_SITE_OTHER): Payer: Medicare Other | Admitting: Podiatry

## 2014-06-20 VITALS — BP 168/78 | HR 60

## 2014-06-20 DIAGNOSIS — M79609 Pain in unspecified limb: Secondary | ICD-10-CM | POA: Diagnosis not present

## 2014-06-20 DIAGNOSIS — M792 Neuralgia and neuritis, unspecified: Secondary | ICD-10-CM

## 2014-06-20 DIAGNOSIS — G5790 Unspecified mononeuropathy of unspecified lower limb: Secondary | ICD-10-CM | POA: Diagnosis not present

## 2014-06-20 DIAGNOSIS — M79605 Pain in left leg: Secondary | ICD-10-CM

## 2014-06-20 DIAGNOSIS — B351 Tinea unguium: Secondary | ICD-10-CM

## 2014-06-20 NOTE — Progress Notes (Signed)
Subjective:  68 year old male presents for diabetic foot care.  Stated that he continues with his daily exercise. He is still losing weight and doing well with blood sugar.   Objective: Nails are hypertrophic.  All pedal pulses are palpable except left dorsalis pedis.  High arch foot without any gross deformities.   Assessment: Mycotic nails x 10.  PVD left lower limb.  Plan: Debrided all nails. Return in 3 months or as needed.

## 2014-06-20 NOTE — Patient Instructions (Signed)
Seen for hypertrophic nails. All nails debrided. Return in 3 months or as needed.  

## 2014-07-07 DIAGNOSIS — E039 Hypothyroidism, unspecified: Secondary | ICD-10-CM | POA: Diagnosis not present

## 2014-07-07 DIAGNOSIS — E1149 Type 2 diabetes mellitus with other diabetic neurological complication: Secondary | ICD-10-CM | POA: Diagnosis not present

## 2014-07-07 DIAGNOSIS — I6529 Occlusion and stenosis of unspecified carotid artery: Secondary | ICD-10-CM | POA: Diagnosis not present

## 2014-07-07 DIAGNOSIS — R413 Other amnesia: Secondary | ICD-10-CM | POA: Diagnosis not present

## 2014-07-07 DIAGNOSIS — Z0000 Encounter for general adult medical examination without abnormal findings: Secondary | ICD-10-CM | POA: Diagnosis not present

## 2014-07-07 DIAGNOSIS — E1142 Type 2 diabetes mellitus with diabetic polyneuropathy: Secondary | ICD-10-CM | POA: Diagnosis not present

## 2014-07-07 DIAGNOSIS — I1310 Hypertensive heart and chronic kidney disease without heart failure, with stage 1 through stage 4 chronic kidney disease, or unspecified chronic kidney disease: Secondary | ICD-10-CM | POA: Diagnosis not present

## 2014-08-12 DIAGNOSIS — H903 Sensorineural hearing loss, bilateral: Secondary | ICD-10-CM | POA: Diagnosis not present

## 2014-08-12 DIAGNOSIS — D333 Benign neoplasm of cranial nerves: Secondary | ICD-10-CM | POA: Diagnosis not present

## 2014-08-14 DIAGNOSIS — M899 Disorder of bone, unspecified: Secondary | ICD-10-CM | POA: Diagnosis not present

## 2014-08-14 DIAGNOSIS — F331 Major depressive disorder, recurrent, moderate: Secondary | ICD-10-CM | POA: Diagnosis not present

## 2014-08-14 DIAGNOSIS — E1149 Type 2 diabetes mellitus with other diabetic neurological complication: Secondary | ICD-10-CM | POA: Diagnosis not present

## 2014-08-14 DIAGNOSIS — E1142 Type 2 diabetes mellitus with diabetic polyneuropathy: Secondary | ICD-10-CM | POA: Diagnosis not present

## 2014-08-14 DIAGNOSIS — M949 Disorder of cartilage, unspecified: Secondary | ICD-10-CM | POA: Diagnosis not present

## 2014-08-14 DIAGNOSIS — M25569 Pain in unspecified knee: Secondary | ICD-10-CM | POA: Diagnosis not present

## 2014-09-08 DIAGNOSIS — D320 Benign neoplasm of cerebral meninges: Secondary | ICD-10-CM | POA: Diagnosis not present

## 2014-09-10 DIAGNOSIS — D320 Benign neoplasm of cerebral meninges: Secondary | ICD-10-CM | POA: Diagnosis not present

## 2014-09-12 DIAGNOSIS — Z23 Encounter for immunization: Secondary | ICD-10-CM | POA: Diagnosis not present

## 2014-09-16 DIAGNOSIS — Z6826 Body mass index (BMI) 26.0-26.9, adult: Secondary | ICD-10-CM | POA: Diagnosis not present

## 2014-09-16 DIAGNOSIS — D333 Benign neoplasm of cranial nerves: Secondary | ICD-10-CM | POA: Diagnosis not present

## 2014-09-16 DIAGNOSIS — D320 Benign neoplasm of cerebral meninges: Secondary | ICD-10-CM | POA: Diagnosis not present

## 2014-09-16 DIAGNOSIS — I10 Essential (primary) hypertension: Secondary | ICD-10-CM | POA: Diagnosis not present

## 2014-09-19 ENCOUNTER — Encounter: Payer: Self-pay | Admitting: Podiatry

## 2014-09-19 ENCOUNTER — Ambulatory Visit (INDEPENDENT_AMBULATORY_CARE_PROVIDER_SITE_OTHER): Payer: Medicare Other | Admitting: Podiatry

## 2014-09-19 VITALS — BP 173/76 | HR 58

## 2014-09-19 DIAGNOSIS — M79606 Pain in leg, unspecified: Secondary | ICD-10-CM | POA: Diagnosis not present

## 2014-09-19 DIAGNOSIS — B351 Tinea unguium: Secondary | ICD-10-CM

## 2014-09-19 NOTE — Progress Notes (Signed)
Subjective:  68 year old male presents for diabetic foot care.  Stated that he continues with his daily exercise and doing well with blood sugar.  His HgA1c is between 6 and 7.   Objective: Nails are hypertrophic.  All pedal pulses are palpable except left dorsalis pedis.  High arch foot without any gross deformities.   Assessment: Mycotic nails x 10.  PVD left lower limb.  Painful feet.  Plan: Debrided all nails.  Return in 3 months or as needed.

## 2014-09-19 NOTE — Patient Instructions (Signed)
Seen for hypertrophic nails. All nails debrided. Return in 3 months or as needed.  

## 2014-10-02 DIAGNOSIS — E119 Type 2 diabetes mellitus without complications: Secondary | ICD-10-CM | POA: Diagnosis not present

## 2014-10-13 DIAGNOSIS — G629 Polyneuropathy, unspecified: Secondary | ICD-10-CM | POA: Diagnosis not present

## 2014-10-13 DIAGNOSIS — E1140 Type 2 diabetes mellitus with diabetic neuropathy, unspecified: Secondary | ICD-10-CM | POA: Diagnosis not present

## 2014-10-13 DIAGNOSIS — E559 Vitamin D deficiency, unspecified: Secondary | ICD-10-CM | POA: Diagnosis not present

## 2014-10-13 DIAGNOSIS — I10 Essential (primary) hypertension: Secondary | ICD-10-CM | POA: Diagnosis not present

## 2014-10-13 DIAGNOSIS — J449 Chronic obstructive pulmonary disease, unspecified: Secondary | ICD-10-CM | POA: Diagnosis not present

## 2014-11-20 ENCOUNTER — Encounter (HOSPITAL_COMMUNITY): Payer: Self-pay | Admitting: Vascular Surgery

## 2014-11-20 DIAGNOSIS — R509 Fever, unspecified: Secondary | ICD-10-CM | POA: Diagnosis not present

## 2014-11-20 DIAGNOSIS — J441 Chronic obstructive pulmonary disease with (acute) exacerbation: Secondary | ICD-10-CM | POA: Diagnosis not present

## 2014-11-20 DIAGNOSIS — R05 Cough: Secondary | ICD-10-CM | POA: Diagnosis not present

## 2014-12-09 ENCOUNTER — Encounter: Payer: Self-pay | Admitting: Vascular Surgery

## 2014-12-10 ENCOUNTER — Ambulatory Visit (HOSPITAL_COMMUNITY)
Admission: RE | Admit: 2014-12-10 | Discharge: 2014-12-10 | Disposition: A | Discharge status: Home / Self Care | Payer: Medicare Other | Source: Ambulatory Visit | Attending: Vascular Surgery | Admitting: Vascular Surgery

## 2014-12-10 ENCOUNTER — Ambulatory Visit (INDEPENDENT_AMBULATORY_CARE_PROVIDER_SITE_OTHER): Payer: Medicare Other | Admitting: Vascular Surgery

## 2014-12-10 ENCOUNTER — Ambulatory Visit (INDEPENDENT_AMBULATORY_CARE_PROVIDER_SITE_OTHER)
Admission: RE | Admit: 2014-12-10 | Discharge: 2014-12-10 | Disposition: A | Discharge status: Home / Self Care | Payer: Medicare Other | Source: Ambulatory Visit | Attending: Vascular Surgery | Admitting: Vascular Surgery

## 2014-12-10 ENCOUNTER — Encounter: Payer: Self-pay | Admitting: Vascular Surgery

## 2014-12-10 VITALS — BP 142/73 | HR 87 | Resp 18 | Ht 72.0 in | Wt 191.0 lb

## 2014-12-10 DIAGNOSIS — I6523 Occlusion and stenosis of bilateral carotid arteries: Secondary | ICD-10-CM

## 2014-12-10 DIAGNOSIS — Z48812 Encounter for surgical aftercare following surgery on the circulatory system: Secondary | ICD-10-CM | POA: Diagnosis not present

## 2014-12-10 DIAGNOSIS — I739 Peripheral vascular disease, unspecified: Secondary | ICD-10-CM

## 2014-12-10 DIAGNOSIS — Z8673 Personal history of transient ischemic attack (TIA), and cerebral infarction without residual deficits: Secondary | ICD-10-CM | POA: Diagnosis not present

## 2014-12-10 NOTE — Progress Notes (Signed)
Office Progress Note  History of Present Illness  Sean Saunders is a 68 y.o. (November 06, 1946) male who presents for one year follow up for carotid disease and peripheral vascular disease.  He previously underwent aortobifemoral bypass graft in 2006 and left femoral to below-knee popliteal bypass graft in August 2012. Since his last visit on 12/11/13, he denies any TIA or stroke symptoms. He denies amaurosis fugax, expressive or receptive aphasia and hemiplegia. He denies any intermittent claudication, rest pain or non healing wounds of the lower extremities. He complains of neuropathy in his feet and chronic paresthesias of his left leg (resulting from previous bypass surgery).   The patient's PMH, PSH, SH, FamHx, Med, and Allergies are unchanged from 12/11/13. He continues to lose weight through a healthy diet and exercises regularly. He says his diabetes is not well-controlled and reports blood glucose levels of up to 400. He is starting to see a new endocrinologist. His son, daughter-in-law and grandchild recently moved in and the patient has become more stressed due to this. He is trying to find his son and daughter-in-law work. He reports needing to "start smoking again" for stress relief.   Physical Examination  Filed Vitals:   12/10/14 1441 12/10/14 1442  BP: 134/72 142/73  Pulse: 55 87  Resp: 18   Height: 6' (1.829 m)   Weight: 191 lb (86.637 kg)    Body mass index is 25.9 kg/(m^2).  Physical Examination  Filed Vitals:   12/10/14 1441 12/10/14 1442  BP: 134/72 142/73  Pulse: 55 87  Resp: 18   Height: 6' (1.829 m)   Weight: 191 lb (86.637 kg)    Body mass index is 25.9 kg/(m^2).  General: A&O x 3, WDWN male in NAD  Pulmonary: Sym exp, good air movt, CTAB, no rales, rhonchi, & wheezing Cardiac: RRR, Nl S1, S2, no murmurs, rubs or gallops Vascular: Vessel Right Left  Radial Palpable Palpable  Carotid  without bruit without bruit  Aorta Not palpable N/A  Femoral Palpable  Palpable  Popliteal Palpable Palpable  PT Palpable Palpable  DP Faintly palpable Not palpable   Gastrointestinal: soft, NTND, - HSM, - masses  Musculoskeletal: M/S 5/5 throughout.Extremities without ischemic changes.  Neurologic: Pain and light touch intact in extremities, Motor exam as listed above  Non-Invasive Vascular Imaging ABI (Date: 12/10/2014) R: 1.28, DP: biphasic, PT: triphasic, TBI: 0.85 L: 1.15, DP: triphasic, PT: triphasic, TBI: 0.79  Lower Extremity Graft Duplex (Date: 12/10/2014)  Patent left limb of aortobifemoral bypass and patent left femoral to below-knee popliteal bypass graft without evidence of increased velocities.   Carotid Duplex (Date: 12/10/2014)  Right ICA: <40% stenosis  Left ICA: 1-39%, upper end of range  Vertebral arteries patent with antegrade bilaterally  Medical Decision Making  Sean Saunders is a 68 y.o. male who presents with:  Occlusion and stenosis of carotid artery without mention of cerebral infarction  His carotid duplex studies are stable from previous year with <40% stenosis bilaterally. He remains asymptomatic. He is on maximal medical management with ASA and statin. His diabetes is not well controlled but he is currently seeing a new endocrinologist. Discussed continued cessation of smoking. He will follow up in one year with carotid duplex studies.   Peripheral vascular disease  He continues to denies any issues with claudication. His aortobifemoral bypass graft and left femoropopliteal bypass grafts are patent and functioning well. He has palpable posterior tibial pulses bilaterally. Encouraged continued diet and exercise and to avoid resuming smoking. He  will follow up in one year with ABIs.   Virgina Jock, PA-C [facility] [place] Office: [phone] Pager: [phone]  12/10/2014, 3:09 PM  This patient was seen in conjunction with Dr. Scot Dock

## 2014-12-10 NOTE — Addendum Note (Signed)
Addended by: Dorthula Rue L on: 12/10/2014 05:06 PM   Modules accepted: Orders

## 2014-12-19 ENCOUNTER — Ambulatory Visit (INDEPENDENT_AMBULATORY_CARE_PROVIDER_SITE_OTHER): Payer: Medicare Other | Admitting: Podiatry

## 2014-12-19 ENCOUNTER — Encounter: Payer: Self-pay | Admitting: Podiatry

## 2014-12-19 VITALS — BP 183/77 | HR 65

## 2014-12-19 DIAGNOSIS — M79606 Pain in leg, unspecified: Secondary | ICD-10-CM | POA: Diagnosis not present

## 2014-12-19 DIAGNOSIS — B351 Tinea unguium: Secondary | ICD-10-CM

## 2014-12-19 NOTE — Progress Notes (Signed)
Subjective:  69 year old male presents for diabetic foot care. He is with extended family and been very busy caring for his grandchildren.  Objective: Nails are hypertrophic.  All pedal pulses are palpable except left dorsalis pedis.  High arch foot without any gross deformities.   Assessment: Mycotic nails x 10.  PVD left lower limb.  Painful feet.  Plan: Debrided all nails.  Return in 3 months or as needed.

## 2014-12-19 NOTE — Patient Instructions (Signed)
Seen for hypertrophic nails. All nails debrided. Return in 3 months or as needed.  

## 2015-01-14 DIAGNOSIS — I129 Hypertensive chronic kidney disease with stage 1 through stage 4 chronic kidney disease, or unspecified chronic kidney disease: Secondary | ICD-10-CM | POA: Diagnosis not present

## 2015-01-14 DIAGNOSIS — E1122 Type 2 diabetes mellitus with diabetic chronic kidney disease: Secondary | ICD-10-CM | POA: Diagnosis not present

## 2015-01-14 DIAGNOSIS — N182 Chronic kidney disease, stage 2 (mild): Secondary | ICD-10-CM | POA: Diagnosis not present

## 2015-01-14 DIAGNOSIS — J449 Chronic obstructive pulmonary disease, unspecified: Secondary | ICD-10-CM | POA: Diagnosis not present

## 2015-03-20 ENCOUNTER — Ambulatory Visit (INDEPENDENT_AMBULATORY_CARE_PROVIDER_SITE_OTHER): Payer: Medicare Other | Admitting: Podiatry

## 2015-03-20 ENCOUNTER — Encounter: Payer: Self-pay | Admitting: Podiatry

## 2015-03-20 VITALS — BP 182/68 | HR 60

## 2015-03-20 DIAGNOSIS — M79606 Pain in leg, unspecified: Secondary | ICD-10-CM | POA: Diagnosis not present

## 2015-03-20 DIAGNOSIS — I739 Peripheral vascular disease, unspecified: Secondary | ICD-10-CM

## 2015-03-20 DIAGNOSIS — B351 Tinea unguium: Secondary | ICD-10-CM | POA: Diagnosis not present

## 2015-03-20 NOTE — Progress Notes (Signed)
Subjective:  68 year old male presents for diabetic foot care.  Having problem controlling blood sugar level.   Objective: Nails are hypertrophic.  All pedal pulses are palpable except left dorsalis pedis.  High arch foot without any gross deformities.   Assessment: Mycotic nails x 10.  PVD left lower limb.  Painful feet.  Plan: Debrided all nails.  Return in 3 months or as needed.

## 2015-03-20 NOTE — Patient Instructions (Signed)
Seen for hypertrophic nails. All nails debrided. Return in 3 months or as needed.  

## 2015-04-29 ENCOUNTER — Emergency Department (HOSPITAL_COMMUNITY)
Admission: EM | Admit: 2015-04-29 | Discharge: 2015-04-29 | Disposition: A | Discharge status: Home / Self Care | Payer: Medicare Other | Attending: Emergency Medicine | Admitting: Emergency Medicine

## 2015-04-29 ENCOUNTER — Encounter (HOSPITAL_COMMUNITY): Payer: Self-pay | Admitting: Emergency Medicine

## 2015-04-29 ENCOUNTER — Emergency Department (HOSPITAL_COMMUNITY): Payer: Medicare Other

## 2015-04-29 DIAGNOSIS — S6992XA Unspecified injury of left wrist, hand and finger(s), initial encounter: Secondary | ICD-10-CM | POA: Diagnosis not present

## 2015-04-29 DIAGNOSIS — Y9389 Activity, other specified: Secondary | ICD-10-CM | POA: Diagnosis not present

## 2015-04-29 DIAGNOSIS — M25532 Pain in left wrist: Secondary | ICD-10-CM

## 2015-04-29 DIAGNOSIS — Z86718 Personal history of other venous thrombosis and embolism: Secondary | ICD-10-CM | POA: Diagnosis not present

## 2015-04-29 DIAGNOSIS — E119 Type 2 diabetes mellitus without complications: Secondary | ICD-10-CM | POA: Insufficient documentation

## 2015-04-29 DIAGNOSIS — Z8673 Personal history of transient ischemic attack (TIA), and cerebral infarction without residual deficits: Secondary | ICD-10-CM | POA: Insufficient documentation

## 2015-04-29 DIAGNOSIS — Z792 Long term (current) use of antibiotics: Secondary | ICD-10-CM | POA: Diagnosis not present

## 2015-04-29 DIAGNOSIS — Z8619 Personal history of other infectious and parasitic diseases: Secondary | ICD-10-CM | POA: Insufficient documentation

## 2015-04-29 DIAGNOSIS — S90511A Abrasion, right ankle, initial encounter: Secondary | ICD-10-CM | POA: Insufficient documentation

## 2015-04-29 DIAGNOSIS — Z794 Long term (current) use of insulin: Secondary | ICD-10-CM | POA: Insufficient documentation

## 2015-04-29 DIAGNOSIS — S8001XA Contusion of right knee, initial encounter: Secondary | ICD-10-CM | POA: Diagnosis not present

## 2015-04-29 DIAGNOSIS — S59902A Unspecified injury of left elbow, initial encounter: Secondary | ICD-10-CM | POA: Diagnosis not present

## 2015-04-29 DIAGNOSIS — Z8719 Personal history of other diseases of the digestive system: Secondary | ICD-10-CM | POA: Diagnosis not present

## 2015-04-29 DIAGNOSIS — E785 Hyperlipidemia, unspecified: Secondary | ICD-10-CM | POA: Diagnosis not present

## 2015-04-29 DIAGNOSIS — W1830XA Fall on same level, unspecified, initial encounter: Secondary | ICD-10-CM | POA: Insufficient documentation

## 2015-04-29 DIAGNOSIS — I10 Essential (primary) hypertension: Secondary | ICD-10-CM | POA: Insufficient documentation

## 2015-04-29 DIAGNOSIS — Y92009 Unspecified place in unspecified non-institutional (private) residence as the place of occurrence of the external cause: Secondary | ICD-10-CM | POA: Insufficient documentation

## 2015-04-29 DIAGNOSIS — J449 Chronic obstructive pulmonary disease, unspecified: Secondary | ICD-10-CM | POA: Insufficient documentation

## 2015-04-29 DIAGNOSIS — Y998 Other external cause status: Secondary | ICD-10-CM | POA: Insufficient documentation

## 2015-04-29 DIAGNOSIS — W19XXXA Unspecified fall, initial encounter: Secondary | ICD-10-CM

## 2015-04-29 DIAGNOSIS — E039 Hypothyroidism, unspecified: Secondary | ICD-10-CM | POA: Insufficient documentation

## 2015-04-29 DIAGNOSIS — S8991XA Unspecified injury of right lower leg, initial encounter: Secondary | ICD-10-CM | POA: Diagnosis present

## 2015-04-29 DIAGNOSIS — Z79899 Other long term (current) drug therapy: Secondary | ICD-10-CM | POA: Diagnosis not present

## 2015-04-29 DIAGNOSIS — M25522 Pain in left elbow: Secondary | ICD-10-CM

## 2015-04-29 DIAGNOSIS — Z87891 Personal history of nicotine dependence: Secondary | ICD-10-CM | POA: Insufficient documentation

## 2015-04-29 DIAGNOSIS — S59912A Unspecified injury of left forearm, initial encounter: Secondary | ICD-10-CM | POA: Insufficient documentation

## 2015-04-29 DIAGNOSIS — M79632 Pain in left forearm: Secondary | ICD-10-CM | POA: Diagnosis not present

## 2015-04-29 MED ORDER — TRAMADOL HCL 50 MG PO TABS: 50.0000 mg | ORAL_TABLET | Freq: Four times a day (QID) | ORAL | Status: DC | PRN

## 2015-04-29 MED ORDER — IBUPROFEN 200 MG PO TABS
600.0000 mg | ORAL_TABLET | Freq: Once | ORAL | Status: AC
  Administered 2015-04-29: 600 mg via ORAL
  Filled 2015-04-29: qty 3

## 2015-04-29 NOTE — ED Notes (Signed)
Pt reports he was on a chair cleaning his ceiling fan when he lost his balance and fell.  States he did not hit his head.  Pt is A&*Ox 4.  C/o L wrist to L elbow pain and R lateral ankle pain.  Swelling noted.  No obvious deformity noted at this time.

## 2015-04-29 NOTE — Discharge Instructions (Signed)
Your xrays today did not show any broken bones.  It is thought your pain is due to contusion/sprain.  Wear splint and sling for comfort for 1 week. Take Ultram for pain.  Follow up with orthopedics if pain is not improving.  Abrasion An abrasion is a cut or scrape of the skin. Abrasions do not extend through all layers of the skin and most heal within 10 days. It is important to care for your abrasion properly to prevent infection. CAUSES  Most abrasions are caused by falling on, or gliding across, the ground or other surface. When your skin rubs on something, the outer and inner layer of skin rubs off, causing an abrasion. DIAGNOSIS  Your caregiver will be able to diagnose an abrasion during a physical exam.  TREATMENT  Your treatment depends on how large and deep the abrasion is. Generally, your abrasion will be cleaned with water and a mild soap to remove any dirt or debris. An antibiotic ointment may be put over the abrasion to prevent an infection. A bandage (dressing) may be wrapped around the abrasion to keep it from getting dirty.  You may need a tetanus shot if:  You cannot remember when you had your last tetanus shot.  You have never had a tetanus shot.  The injury broke your skin. If you get a tetanus shot, your arm may swell, get red, and feel warm to the touch. This is common and not a problem. If you need a tetanus shot and you choose not to have one, there is a rare chance of getting tetanus. Sickness from tetanus can be serious.  HOME CARE INSTRUCTIONS   If a dressing was applied, change it at least once a day or as directed by your caregiver. If the bandage sticks, soak it off with warm water.   Wash the area with water and a mild soap to remove all the ointment 2 times a day. Rinse off the soap and pat the area dry with a clean towel.   Reapply any ointment as directed by your caregiver. This will help prevent infection and keep the bandage from sticking. Use gauze over  the wound and under the dressing to help keep the bandage from sticking.   Change your dressing right away if it becomes wet or dirty.   Only take over-the-counter or prescription medicines for pain, discomfort, or fever as directed by your caregiver.   Follow up with your caregiver within 24-48 hours for a wound check, or as directed. If you were not given a wound-check appointment, look closely at your abrasion for redness, swelling, or pus. These are signs of infection. SEEK IMMEDIATE MEDICAL CARE IF:   You have increasing pain in the wound.   You have redness, swelling, or tenderness around the wound.   You have pus coming from the wound.   You have a fever or persistent symptoms for more than 2-3 days.  You have a fever and your symptoms suddenly get worse.  You have a bad smell coming from the wound or dressing.  MAKE SURE YOU:   Understand these instructions.  Will watch your condition.  Will get help right away if you are not doing well or get worse. Document Released: 09/07/2005 Document Revised: 11/14/2012 Document Reviewed: 11/01/2011 [facility] Patient Information 2015 [place], [place]. This information is not intended to replace advice given to you by your health care provider. Make sure you discuss any questions you have with your health care provider.  Contusion  A contusion is a deep bruise. Contusions are the result of an injury that caused bleeding under the skin. The contusion may turn blue, purple, or yellow. Minor injuries will give you a painless contusion, but more severe contusions may stay painful and swollen for a few weeks.  CAUSES  A contusion is usually caused by a blow, trauma, or direct force to an area of the body. SYMPTOMS   Swelling and redness of the injured area.  Bruising of the injured area.  Tenderness and soreness of the injured area.  Pain. DIAGNOSIS  The diagnosis can be made by taking a history and physical exam. An X-ray, CT  scan, or MRI may be needed to determine if there were any associated injuries, such as fractures. TREATMENT  Specific treatment will depend on what area of the body was injured. In general, the best treatment for a contusion is resting, icing, elevating, and applying cold compresses to the injured area. Over-the-counter medicines may also be recommended for pain control. Ask your caregiver what the best treatment is for your contusion. HOME CARE INSTRUCTIONS   Put ice on the injured area.  Put ice in a plastic bag.  Place a towel between your skin and the bag.  Leave the ice on for 15-20 minutes, 3-4 times a day, or as directed by your health care provider.  Only take over-the-counter or prescription medicines for pain, discomfort, or fever as directed by your caregiver. Your caregiver may recommend avoiding anti-inflammatory medicines (aspirin, ibuprofen, and naproxen) for 48 hours because these medicines may increase bruising.  Rest the injured area.  If possible, elevate the injured area to reduce swelling. SEEK IMMEDIATE MEDICAL CARE IF:   You have increased bruising or swelling.  You have pain that is getting worse.  Your swelling or pain is not relieved with medicines. MAKE SURE YOU:   Understand these instructions.  Will watch your condition.  Will get help right away if you are not doing well or get worse. Document Released: 09/07/2005 Document Revised: 12/03/2013 Document Reviewed: 10/03/2011 [facility] Patient Information 2015 [place], [place]. This information is not intended to replace advice given to you by your health care provider. Make sure you discuss any questions you have with your health care provider.  Fall Prevention and Home Safety Falls cause injuries and can affect all age groups. It is possible to prevent falls.  HOW TO PREVENT FALLS  Wear shoes with rubber soles that do not have an opening for your toes.  Keep the inside and outside of your house well  lit.  Use night lights throughout your home.  Remove clutter from floors.  Clean up floor spills.  Remove throw rugs or fasten them to the floor with carpet tape.  Do not place electrical cords across pathways.  Put grab bars by your tub, shower, and toilet. Do not use towel bars as grab bars.  Put handrails on both sides of the stairway. Fix loose handrails.  Do not climb on stools or stepladders, if possible.  Do not wax your floors.  Repair uneven or unsafe sidewalks, walkways, or stairs.  Keep items you use a lot within reach.  Be aware of pets.  Keep emergency numbers next to the telephone.  Put smoke detectors in your home and near bedrooms. Ask your doctor what other things you can do to prevent falls. Document Released: 09/24/2009 Document Revised: 05/29/2012 Document Reviewed: 02/28/2012 [facility] Patient Information 2015 [place], [place]. This information is not intended to replace advice  given to you by your health care provider. Make sure you discuss any questions you have with your health care provider.  Cryotherapy Cryotherapy means treatment with cold. Ice or gel packs can be used to reduce both pain and swelling. Ice is the most helpful within the first 24 to 48 hours after an injury or flare-up from overusing a muscle or joint. Sprains, strains, spasms, burning pain, shooting pain, and aches can all be eased with ice. Ice can also be used when recovering from surgery. Ice is effective, has very few side effects, and is safe for most people to use. PRECAUTIONS  Ice is not a safe treatment option for people with:  Raynaud phenomenon. This is a condition affecting small blood vessels in the extremities. Exposure to cold may cause your problems to return.  Cold hypersensitivity. There are many forms of cold hypersensitivity, including:  Cold urticaria. Red, itchy hives appear on the skin when the tissues begin to warm after being iced.  Cold erythema. This is a  red, itchy rash caused by exposure to cold.  Cold hemoglobinuria. Red blood cells break down when the tissues begin to warm after being iced. The hemoglobin that carry oxygen are passed into the urine because they cannot combine with blood proteins fast enough.  Numbness or altered sensitivity in the area being iced. If you have any of the following conditions, do not use ice until you have discussed cryotherapy with your caregiver:  Heart conditions, such as arrhythmia, angina, or chronic heart disease.  High blood pressure.  Healing wounds or open skin in the area being iced.  Current infections.  Rheumatoid arthritis.  Poor circulation.  Diabetes. Ice slows the blood flow in the region it is applied. This is beneficial when trying to stop inflamed tissues from spreading irritating chemicals to surrounding tissues. However, if you expose your skin to cold temperatures for too long or without the proper protection, you can damage your skin or nerves. Watch for signs of skin damage due to cold. HOME CARE INSTRUCTIONS Follow these tips to use ice and cold packs safely.  Place a dry or damp towel between the ice and skin. A damp towel will cool the skin more quickly, so you may need to shorten the time that the ice is used.  For a more rapid response, add gentle compression to the ice.  Ice for no more than 10 to 20 minutes at a time. The bonier the area you are icing, the less time it will take to get the benefits of ice.  Check your skin after 5 minutes to make sure there are no signs of a poor response to cold or skin damage.  Rest 20 minutes or more between uses.  Once your skin is numb, you can end your treatment. You can test numbness by very lightly touching your skin. The touch should be so light that you do not see the skin dimple from the pressure of your fingertip. When using ice, most people will feel these normal sensations in this order: cold, burning, aching, and  numbness.  Do not use ice on someone who cannot communicate their responses to pain, such as small children or people with dementia. HOW TO MAKE AN ICE PACK Ice packs are the most common way to use ice therapy. Other methods include ice massage, ice baths, and cryosprays. Muscle creams that cause a cold, tingly feeling do not offer the same benefits that ice offers and should not be used as  a substitute unless recommended by your caregiver. To make an ice pack, do one of the following:  Place crushed ice or a bag of frozen vegetables in a sealable plastic bag. Squeeze out the excess air. Place this bag inside another plastic bag. Slide the bag into a pillowcase or place a damp towel between your skin and the bag.  Mix 3 parts water with 1 part rubbing alcohol. Freeze the mixture in a sealable plastic bag. When you remove the mixture from the freezer, it will be slushy. Squeeze out the excess air. Place this bag inside another plastic bag. Slide the bag into a pillowcase or place a damp towel between your skin and the bag. SEEK MEDICAL CARE IF:  You develop white spots on your skin. This may give the skin a blotchy (mottled) appearance.  Your skin turns blue or pale.  Your skin becomes waxy or hard.  Your swelling gets worse. MAKE SURE YOU:   Understand these instructions.  Will watch your condition.  Will get help right away if you are not doing well or get worse. Document Released: 07/25/2011 Document Revised: 04/14/2014 Document Reviewed: 07/25/2011 [facility] Patient Information 2015 [place], [place]. This information is not intended to replace advice given to you by your health care provider. Make sure you discuss any questions you have with your health care provider.

## 2015-04-29 NOTE — ED Provider Notes (Signed)
CSN: [id number]     Arrival date & time 04/29/15  0050 History   First MD Initiated Contact with Patient 04/29/15 0059     Chief Complaint  Patient presents with  . Arm Injury     (Consider location/radiation/quality/duration/timing/severity/associated sxs/prior Treatment) HPI 69 year old male presents to emergency department after a fall at home with left arm pain.  Patient reports that he was up on a chair trying to clean his ceiling fan when he fell landing on his left arm.  Patient also reports contusion to his right knee and scraped a little right ankle.  He denies striking his head or LOC.  Patient took a over-the-counter sleeping pill, was unable to sleep secondary to ongoing pain and presented to the emergency department.  He is concerned for fracture. Past Medical History  Diagnosis Date  . Hypertension   . Diabetes mellitus   . COPD (chronic obstructive pulmonary disease)   . Hyperlipidemia   . Thyroid disease   . TIA (transient ischemic attack) 08/03/2011    Left sided weakness. 3 episodes Feb-March  . TIA (transient ischemic attack)     x 3  . Bronchitis     hx of  . Hypothyroidism   . GERD (gastroesophageal reflux disease)   . DVT (deep venous thrombosis)   . Stroke     TIA's 01/05/11-01/08/11-01/26/11  . Onychomycosis 06/07/2013   Past Surgical History  Procedure Laterality Date  . Appendectomy    . Nasal sinus surgery    . Pr vein bypass graft,aorto-fem-pop    . Pr vein bypass graft,aorto-fem-pop  08-01-11    (L) Fem-pop BP  . Toe nail    . Tonsillectomy    . Colonoscopy    . Femoral-popliteal bypass graft  07/05/2012    Procedure: BYPASS GRAFT FEMORAL-POPLITEAL ARTERY;  Surgeon: Angelia Mould, MD;  Location: [facility];  Service: Vascular;  Laterality: Left;  Revision Left fem/pop w/ artery patch angioplasty utilizing left GSV graft and extenion to tib/per trunk w/right GSV and interop arteriogram  . Joint replacement  2011    -Hip  . Abdominal aortagram N/A  06/04/2012    Procedure: ABDOMINAL Maxcine Ham;  Surgeon: Angelia Mould, MD;  Location: [facility] CATH LAB;  Service: Cardiovascular;  Laterality: N/A;   Family History  Problem Relation Age of Onset  . Stroke Mother   . Diabetes Mother   . Other Father     alzheimers  . Diabetes Father   . Stroke Father   . Diabetes Sister    History  Substance Use Topics  . Smoking status: Former Smoker -- 0.50 packs/day for 5 years    Types: Cigarettes    Quit date: 08/02/2011  . Smokeless tobacco: Never Used  . Alcohol Use: 0.0 oz/week    0 Standard drinks or equivalent per week     Comment: rare    Review of Systems   See History of Present Illness; otherwise all other systems are reviewed and negative  Allergies  Eszopiclone and Niaspan  Home Medications   Prior to Admission medications   Medication Sig Start Date End Date Taking? Authorizing Provider  albuterol-ipratropium (COMBIVENT) 18-103 MCG/ACT inhaler Inhale 1 puff into the lungs every morning.   Yes Historical Provider, MD  amLODipine (NORVASC) 10 MG tablet Take 10 mg by mouth daily.   Yes Historical Provider, MD  Calcium-Magnesium 500-250 MG TABS Take 1 tablet by mouth daily.   Yes Historical Provider, MD  Cholecalciferol (VITAMIN D) 2000 UNITS CAPS Take  2,000 Units by mouth daily.   Yes Historical Provider, MD  clopidogrel (PLAVIX) 75 MG tablet Take 75 mg by mouth daily.     Yes Historical Provider, MD  furosemide (LASIX) 20 MG tablet Take 20 mg by mouth daily.    Yes Historical Provider, MD  insulin regular human CONCENTRATED (HUMULIN R) 500 UNIT/ML SOLN injection Inject 5 Units into the skin 3 (three) times daily.    Yes Historical Provider, MD  levothyroxine (SYNTHROID, LEVOTHROID) 137 MCG tablet Take 137 mcg by mouth daily.     Yes Historical Provider, MD  metFORMIN (GLUMETZA) 1000 MG (MOD) 24 hr tablet Take 1,000 mg by mouth 2 (two) times daily.   Yes Historical Provider, MD  Multiple Vitamin (MULTIVITAMIN WITH MINERALS)  TABS tablet Take 1 tablet by mouth daily.   Yes Historical Provider, MD  nebivolol (BYSTOLIC) 5 MG tablet Take 5 mg by mouth daily.   Yes Historical Provider, MD  rosuvastatin (CRESTOR) 20 MG tablet Take 20 mg by mouth daily.   Yes Historical Provider, MD  sitaGLIPtin (JANUVIA) 100 MG tablet Take 100 mg by mouth daily.     Yes Historical Provider, MD  valsartan (DIOVAN) 320 MG tablet Take 320 mg by mouth daily.   Yes Historical Provider, MD  budesonide-formoterol (SYMBICORT) 160-4.5 MCG/ACT inhaler Inhale 2 puffs into the lungs as needed.    Historical Provider, MD  ciprofloxacin (CIPRO) 500 MG tablet Take 1 tablet (500 mg total) by mouth 2 (two) times daily. Patient not taking: Reported on 04/29/2015 09/05/12   Angelia Mould, MD  ciprofloxacin (CIPRO) 500 MG tablet Take 1 tablet (500 mg total) by mouth 2 (two) times daily. Patient not taking: Reported on 04/29/2015 11/13/12   Princess Perna, NP   BP 168/67 mmHg  Pulse 71  Temp(Src) 98.2 F (36.8 C) (Oral)  Resp 22  Wt 195 lb (88.451 kg)  SpO2 99% Physical Exam  Constitutional: He is oriented to person, place, and time. He appears well-developed and well-nourished.  HENT:  Head: Normocephalic and atraumatic.  Nose: Nose normal.  Mouth/Throat: Oropharynx is clear and moist.  Eyes: Conjunctivae and EOM are normal. Pupils are equal, round, and reactive to light.  Neck: Normal range of motion. Neck supple. No JVD present. No tracheal deviation present. No thyromegaly present.  Cardiovascular: Normal rate, regular rhythm, normal heart sounds and intact distal pulses.  Exam reveals no gallop and no friction rub.   No murmur heard. Pulmonary/Chest: Effort normal and breath sounds normal. No stridor. No respiratory distress. He has no wheezes. He has no rales. He exhibits no tenderness.  Abdominal: Soft. Bowel sounds are normal. He exhibits no distension and no mass. There is no tenderness. There is no rebound and no guarding.   Musculoskeletal: He exhibits edema and tenderness.  Left upper extremity: Patient has no obvious deformity.  No edema, no skin changes.  He has tenderness to palpation from elbow to wrist.  There is no step-off or crepitus with palpation.  He has decreased range of motion at elbow, wrist and with supination, pronation secondary to pain.  He has normal pulses.  Distally he is neurovascularly intact.  He has no snuffbox tenderness.  Right lower extremity: Patient has contusion to medial knee.  He has slightly decreased range of motion.  There is no crepitus.  Anterior posterior drawer is intact.  Normal varus valgus stress.  Patient has abrasion to right lateral ankle with mild edema.  Normal range of motion at the ankle.  Lymphadenopathy:    He has no cervical adenopathy.  Neurological: He is alert and oriented to person, place, and time. He displays normal reflexes. He exhibits normal muscle tone. Coordination normal.  Skin: Skin is warm and dry. No rash noted. No erythema. No pallor.  Psychiatric: He has a normal mood and affect. His behavior is normal. Judgment and thought content normal.  Nursing note and vitals reviewed.   ED Course  Procedures (including critical care time) Labs Review Labs Reviewed - No data to display  Imaging Review Dg Elbow Complete Left  04/29/2015   CLINICAL DATA:  Left arm injury after fall. Fall while stepping up on a chair to clean ceiling fan yesterday. Diffuse arm pain.  EXAM: LEFT ELBOW - COMPLETE 3+ VIEW  COMPARISON:  None.  FINDINGS: No fracture or dislocation. The alignment and joint spaces are maintained. No joint effusion. No focal soft tissue abnormality.  IMPRESSION: No fracture or subluxation of the elbow.   Electronically Signed   By: Jeb Levering M.D.   On: 04/29/2015 01:42   Dg Forearm Left  04/29/2015   CLINICAL DATA:  Left arm injury after fall. Fall while stepping up on a chair to clean ceiling fan yesterday. Diffuse arm pain.  EXAM: LEFT  FOREARM - 2 VIEW  COMPARISON:  None.  FINDINGS: The cortical margins are intact. No fracture or dislocation. The alignment and joint spaces are maintained. There is no focal soft tissue abnormality.  IMPRESSION: No fracture or dislocation of the left forearm.   Electronically Signed   By: Jeb Levering M.D.   On: 04/29/2015 01:43   Dg Wrist Complete Left  04/29/2015   CLINICAL DATA:  Left arm injury after fall. Fall while stepping up on a chair to clean ceiling fan yesterday. Diffuse arm pain.  EXAM: LEFT WRIST - COMPLETE 3+ VIEW  COMPARISON:  None.  FINDINGS: No fracture or dislocation. The alignment and joint spaces are maintained. Scaphoid is intact. Physeal scar noted on in the distal radius. No focal soft tissue abnormality.  IMPRESSION: No fracture or dislocation of the left wrist.   Electronically Signed   By: Jeb Levering M.D.   On: 04/29/2015 01:44     EKG Interpretation None      MDM   Final diagnoses:  Fall at home, initial encounter  Knee contusion, right, initial encounter  Ankle abrasion, right, initial encounter  Pain in left forearm  Pain in left elbow  Left wrist pain   69 yo male s/p fall at home.  Contusion to right knee, abrasion to right lateral ankle, normal ambulation.  Pain to left forearm, elbow to wrist.  Xrays without fracture.  Will d/c home with pain medication, wrist splint,sling and f/u with ortho prn.    Linton Flemings, MD 04/29/15 [phone]

## 2015-04-29 NOTE — ED Notes (Signed)
Pt states he fell cleaning his ceiling fan and now has pain from his L wrist to his elbow. Alert and oriented.

## 2015-05-13 DIAGNOSIS — E1122 Type 2 diabetes mellitus with diabetic chronic kidney disease: Secondary | ICD-10-CM | POA: Diagnosis not present

## 2015-05-13 DIAGNOSIS — S63509S Unspecified sprain of unspecified wrist, sequela: Secondary | ICD-10-CM | POA: Diagnosis not present

## 2015-05-13 DIAGNOSIS — N182 Chronic kidney disease, stage 2 (mild): Secondary | ICD-10-CM | POA: Diagnosis not present

## 2015-05-13 DIAGNOSIS — Z9181 History of falling: Secondary | ICD-10-CM | POA: Diagnosis not present

## 2015-06-08 DIAGNOSIS — M25522 Pain in left elbow: Secondary | ICD-10-CM | POA: Diagnosis not present

## 2015-06-08 DIAGNOSIS — M25532 Pain in left wrist: Secondary | ICD-10-CM | POA: Diagnosis not present

## 2015-06-17 DIAGNOSIS — M25532 Pain in left wrist: Secondary | ICD-10-CM | POA: Diagnosis not present

## 2015-06-19 ENCOUNTER — Ambulatory Visit (INDEPENDENT_AMBULATORY_CARE_PROVIDER_SITE_OTHER): Payer: Medicare Other | Admitting: Podiatry

## 2015-06-19 ENCOUNTER — Encounter: Payer: Self-pay | Admitting: Podiatry

## 2015-06-19 VITALS — BP 148/65 | HR 60

## 2015-06-19 DIAGNOSIS — B351 Tinea unguium: Secondary | ICD-10-CM

## 2015-06-19 DIAGNOSIS — M25532 Pain in left wrist: Secondary | ICD-10-CM | POA: Diagnosis not present

## 2015-06-19 DIAGNOSIS — M25552 Pain in left hip: Secondary | ICD-10-CM | POA: Diagnosis not present

## 2015-06-19 DIAGNOSIS — M79606 Pain in leg, unspecified: Secondary | ICD-10-CM

## 2015-06-19 NOTE — Progress Notes (Signed)
Subjective:  69 year old male presents requesting toe nails trimmed. Still having problem controlling blood sugar level.   Objective: Nails are hypertrophic.  All pedal pulses are palpable except left dorsalis pedis.  High arch foot without any gross deformities.   Assessment: Mycotic nails x 10.  PVD left lower limb.  Painful feet.  Plan: Debrided all nails.  Return in 3 months or as needed.

## 2015-06-19 NOTE — Patient Instructions (Signed)
Seen for hypertrophic nails. All nails debrided. Return in 3 months or as needed.  

## 2015-08-18 DIAGNOSIS — H903 Sensorineural hearing loss, bilateral: Secondary | ICD-10-CM | POA: Diagnosis not present

## 2015-08-19 DIAGNOSIS — E1122 Type 2 diabetes mellitus with diabetic chronic kidney disease: Secondary | ICD-10-CM | POA: Diagnosis not present

## 2015-08-19 DIAGNOSIS — Z1389 Encounter for screening for other disorder: Secondary | ICD-10-CM | POA: Diagnosis not present

## 2015-08-19 DIAGNOSIS — N08 Glomerular disorders in diseases classified elsewhere: Secondary | ICD-10-CM | POA: Diagnosis not present

## 2015-08-19 DIAGNOSIS — Z0000 Encounter for general adult medical examination without abnormal findings: Secondary | ICD-10-CM | POA: Diagnosis not present

## 2015-08-19 DIAGNOSIS — I1310 Hypertensive heart and chronic kidney disease without heart failure, with stage 1 through stage 4 chronic kidney disease, or unspecified chronic kidney disease: Secondary | ICD-10-CM | POA: Diagnosis not present

## 2015-08-19 DIAGNOSIS — R351 Nocturia: Secondary | ICD-10-CM | POA: Diagnosis not present

## 2015-08-19 DIAGNOSIS — E559 Vitamin D deficiency, unspecified: Secondary | ICD-10-CM | POA: Diagnosis not present

## 2015-08-19 DIAGNOSIS — Z125 Encounter for screening for malignant neoplasm of prostate: Secondary | ICD-10-CM | POA: Diagnosis not present

## 2015-08-19 DIAGNOSIS — N182 Chronic kidney disease, stage 2 (mild): Secondary | ICD-10-CM | POA: Diagnosis not present

## 2015-08-19 DIAGNOSIS — Z1212 Encounter for screening for malignant neoplasm of rectum: Secondary | ICD-10-CM | POA: Diagnosis not present

## 2015-08-19 DIAGNOSIS — Z23 Encounter for immunization: Secondary | ICD-10-CM | POA: Diagnosis not present

## 2015-09-07 DIAGNOSIS — E875 Hyperkalemia: Secondary | ICD-10-CM | POA: Diagnosis not present

## 2015-09-07 DIAGNOSIS — E230 Hypopituitarism: Secondary | ICD-10-CM | POA: Diagnosis not present

## 2015-09-17 ENCOUNTER — Ambulatory Visit (INDEPENDENT_AMBULATORY_CARE_PROVIDER_SITE_OTHER): Payer: Medicare Other | Admitting: Podiatry

## 2015-09-17 ENCOUNTER — Encounter: Payer: Self-pay | Admitting: Podiatry

## 2015-09-17 VITALS — BP 148/63 | HR 55

## 2015-09-17 DIAGNOSIS — M79606 Pain in leg, unspecified: Secondary | ICD-10-CM

## 2015-09-17 DIAGNOSIS — B351 Tinea unguium: Secondary | ICD-10-CM | POA: Diagnosis not present

## 2015-09-17 NOTE — Patient Instructions (Signed)
Seen for hypertrophic nails. All nails debrided. Return in 3 months or as needed.  

## 2015-09-17 NOTE — Progress Notes (Signed)
Subjective:  69 year old male presents requesting toe nails and calluses trimmed. Blood sugar is under control now, but being monitored for brain tumor that was diagnosed when he had a mini stroke 4 months ago.   Objective: Nails are hypertrophic.  Thick plantar heel calluses bilateral.  All pedal pulses are palpable except left dorsalis pedis.  High arch foot without any gross deformities.   Assessment: Mycotic nails x 10.  PVD left lower limb.  Painful feet.  Plan: Debrided all nails.  Return in 3 months or as needed.

## 2015-09-18 ENCOUNTER — Ambulatory Visit: Payer: Medicare Other | Admitting: Podiatry

## 2015-09-21 DIAGNOSIS — D333 Benign neoplasm of cranial nerves: Secondary | ICD-10-CM | POA: Diagnosis not present

## 2015-09-23 DIAGNOSIS — D320 Benign neoplasm of cerebral meninges: Secondary | ICD-10-CM | POA: Diagnosis not present

## 2015-09-23 DIAGNOSIS — D329 Benign neoplasm of meninges, unspecified: Secondary | ICD-10-CM | POA: Diagnosis not present

## 2015-09-25 DIAGNOSIS — I10 Essential (primary) hypertension: Secondary | ICD-10-CM | POA: Diagnosis not present

## 2015-09-25 DIAGNOSIS — D333 Benign neoplasm of cranial nerves: Secondary | ICD-10-CM | POA: Diagnosis not present

## 2015-09-25 DIAGNOSIS — D320 Benign neoplasm of cerebral meninges: Secondary | ICD-10-CM | POA: Diagnosis not present

## 2015-10-19 DIAGNOSIS — R972 Elevated prostate specific antigen [PSA]: Secondary | ICD-10-CM | POA: Diagnosis not present

## 2015-11-18 DIAGNOSIS — I1310 Hypertensive heart and chronic kidney disease without heart failure, with stage 1 through stage 4 chronic kidney disease, or unspecified chronic kidney disease: Secondary | ICD-10-CM | POA: Diagnosis not present

## 2015-11-18 DIAGNOSIS — N08 Glomerular disorders in diseases classified elsewhere: Secondary | ICD-10-CM | POA: Diagnosis not present

## 2015-11-18 DIAGNOSIS — N182 Chronic kidney disease, stage 2 (mild): Secondary | ICD-10-CM | POA: Diagnosis not present

## 2015-11-18 DIAGNOSIS — E1122 Type 2 diabetes mellitus with diabetic chronic kidney disease: Secondary | ICD-10-CM | POA: Diagnosis not present

## 2015-12-08 ENCOUNTER — Encounter: Payer: Self-pay | Admitting: Vascular Surgery

## 2015-12-16 ENCOUNTER — Encounter: Payer: Self-pay | Admitting: Vascular Surgery

## 2015-12-16 ENCOUNTER — Ambulatory Visit (INDEPENDENT_AMBULATORY_CARE_PROVIDER_SITE_OTHER): Payer: Medicare Other | Admitting: Vascular Surgery

## 2015-12-16 ENCOUNTER — Other Ambulatory Visit: Payer: Self-pay | Admitting: Vascular Surgery

## 2015-12-16 ENCOUNTER — Ambulatory Visit (HOSPITAL_COMMUNITY)
Admission: RE | Admit: 2015-12-16 | Discharge: 2015-12-16 | Disposition: A | Discharge status: Home / Self Care | Payer: Medicare Other | Source: Ambulatory Visit | Attending: Vascular Surgery | Admitting: Vascular Surgery

## 2015-12-16 ENCOUNTER — Ambulatory Visit (INDEPENDENT_AMBULATORY_CARE_PROVIDER_SITE_OTHER)
Admission: RE | Admit: 2015-12-16 | Discharge: 2015-12-16 | Disposition: A | Discharge status: Home / Self Care | Payer: Medicare Other | Source: Ambulatory Visit | Attending: Vascular Surgery | Admitting: Vascular Surgery

## 2015-12-16 VITALS — BP 142/64 | HR 58 | Temp 98.2°F | Resp 14 | Ht 72.0 in | Wt 185.0 lb

## 2015-12-16 DIAGNOSIS — I10 Essential (primary) hypertension: Secondary | ICD-10-CM | POA: Diagnosis not present

## 2015-12-16 DIAGNOSIS — I6523 Occlusion and stenosis of bilateral carotid arteries: Secondary | ICD-10-CM

## 2015-12-16 DIAGNOSIS — I739 Peripheral vascular disease, unspecified: Secondary | ICD-10-CM | POA: Diagnosis not present

## 2015-12-16 DIAGNOSIS — Z48812 Encounter for surgical aftercare following surgery on the circulatory system: Secondary | ICD-10-CM | POA: Diagnosis not present

## 2015-12-16 DIAGNOSIS — E785 Hyperlipidemia, unspecified: Secondary | ICD-10-CM | POA: Diagnosis not present

## 2015-12-16 DIAGNOSIS — E119 Type 2 diabetes mellitus without complications: Secondary | ICD-10-CM | POA: Insufficient documentation

## 2015-12-16 NOTE — Progress Notes (Signed)
Filed Vitals:   12/16/15 1305 12/16/15 1307 12/16/15 1316  BP: 134/59 161/59 142/64  Pulse: 56 58 58  Temp:  98.2 F (36.8 C)   TempSrc:  Oral   Resp:  14   Height:  6' (1.829 m)   Weight:  185 lb (83.915 kg)   SpO2:  100%

## 2015-12-16 NOTE — Progress Notes (Signed)
HISTORY AND PHYSICAL     CC:  1 year carotid and PVD follow up Referring Provider:  Glendale Chard, MD  HPI: This is a 70 y.o. male who presents for a one year follow up for carotid disease and PVD.  He has a hx of aortobifemoral bypass grafting in '06 and a left femoral to popliteal bypass grafting in August 2012 with further revisions.    He denies any stroke sx such as amaurosis fugax, hemiparesis or speech problems.  He denies any claudication.  He states that he has gotten a new recliner and where the back of his knees rest, after a while, he will get some numbness in his feet and this improves when he gets up.  He states that he has been doing quite well and has lost about 60lbs, which has been intentional.  He states he is eating better and not eating anything "white".  He states that his diabetic medication doses have not changed despite the weight loss.   He does take insulin, Metformin, Januvia for his diabetes.  He is on an ARB, CCB, and a beta blocker for blood pressure management.  He also takes Plavix.    He does go to the podiatrist to get his toe nails trimmed as well as his calluses.  He has an appointment tomorrow.   He states he has not smoked in 4 years.    Past Medical History  Diagnosis Date  . Hypertension   . Diabetes mellitus   . COPD (chronic obstructive pulmonary disease) ([place])   . Hyperlipidemia   . Thyroid disease   . TIA (transient ischemic attack) 08/03/2011    Left sided weakness. 3 episodes Feb-March  . TIA (transient ischemic attack)     x 3  . Bronchitis     hx of  . Hypothyroidism   . GERD (gastroesophageal reflux disease)   . DVT (deep venous thrombosis) ([place])   . Stroke [facility])     TIA's 01/05/11-01/08/11-01/26/11  . Onychomycosis 06/07/2013    Past Surgical History  Procedure Laterality Date  . Appendectomy    . Nasal sinus surgery    . Pr vein bypass graft,aorto-fem-pop    . Pr vein bypass graft,aorto-fem-pop  08-01-11    (L) Fem-pop BP    . Toe nail    . Tonsillectomy    . Colonoscopy    . Femoral-popliteal bypass graft  07/05/2012    Procedure: BYPASS GRAFT FEMORAL-POPLITEAL ARTERY;  Surgeon: Angelia Mould, MD;  Location: [facility];  Service: Vascular;  Laterality: Left;  Revision Left fem/pop w/ artery patch angioplasty utilizing left GSV graft and extenion to tib/per trunk w/right GSV and interop arteriogram  . Joint replacement  2011    -Hip  . Abdominal aortagram N/A 06/04/2012    Procedure: ABDOMINAL Maxcine Ham;  Surgeon: Angelia Mould, MD;  Location: [facility] CATH LAB;  Service: Cardiovascular;  Laterality: N/A;    Allergies  Allergen Reactions  . Eszopiclone Itching  . Niaspan [Niacin Er] Nausea And Vomiting and Rash    Current Outpatient Prescriptions  Medication Sig Dispense Refill  . albuterol-ipratropium (COMBIVENT) 18-103 MCG/ACT inhaler Inhale 1 puff into the lungs every morning.    Marland Kitchen amLODipine (NORVASC) 10 MG tablet Take 10 mg by mouth daily.    . budesonide-formoterol (SYMBICORT) 160-4.5 MCG/ACT inhaler Inhale 2 puffs into the lungs as needed. Reported on 12/16/2015    . Calcium-Magnesium 500-250 MG TABS Take 1 tablet by mouth daily.    . Cholecalciferol (  VITAMIN D) 2000 UNITS CAPS Take 2,000 Units by mouth daily.    . ciprofloxacin (CIPRO) 500 MG tablet Take 1 tablet (500 mg total) by mouth 2 (two) times daily. (Patient not taking: Reported on 12/16/2015) 28 tablet 0  . ciprofloxacin (CIPRO) 500 MG tablet Take 1 tablet (500 mg total) by mouth 2 (two) times daily. (Patient not taking: Reported on 12/16/2015) 20 tablet 0  . clopidogrel (PLAVIX) 75 MG tablet Take 75 mg by mouth daily.      . furosemide (LASIX) 20 MG tablet Take 20 mg by mouth daily.     . insulin regular human CONCENTRATED (HUMULIN R) 500 UNIT/ML SOLN injection Inject 10 Units into the skin 2 (two) times daily.     Marland Kitchen levothyroxine (SYNTHROID, LEVOTHROID) 137 MCG tablet Take 137 mcg by mouth daily.      . metFORMIN (GLUMETZA) 1000 MG (MOD)  24 hr tablet Take 1,000 mg by mouth 2 (two) times daily.    . Multiple Vitamin (MULTIVITAMIN WITH MINERALS) TABS tablet Take 1 tablet by mouth daily.    . nebivolol (BYSTOLIC) 5 MG tablet Take 5 mg by mouth daily.    . rosuvastatin (CRESTOR) 20 MG tablet Take 20 mg by mouth daily.    . sitaGLIPtin (JANUVIA) 100 MG tablet Take 100 mg by mouth daily.      . Testosterone 10 MG/ACT (2%) GEL daily.  0  . traMADol (ULTRAM) 50 MG tablet Take 1 tablet (50 mg total) by mouth every 6 (six) hours as needed for moderate pain or severe pain. (Patient not taking: Reported on 12/16/2015) 20 tablet 0  . valsartan (DIOVAN) 320 MG tablet Take 320 mg by mouth daily.     No current facility-administered medications for this visit.    Family History  Problem Relation Age of Onset  . Stroke Mother   . Diabetes Mother   . Other Father     alzheimers  . Diabetes Father   . Stroke Father   . Diabetes Sister     Social History   Social History  . Marital Status: Married    Spouse Name: N/A  . Number of Children: N/A  . Years of Education: N/A   Occupational History  . Not on file.   Social History Main Topics  . Smoking status: Former Smoker -- 0.50 packs/day for 5 years    Types: Cigarettes    Quit date: 08/02/2011  . Smokeless tobacco: Never Used  . Alcohol Use: 0.0 oz/week    0 Standard drinks or equivalent per week     Comment: rare  . Drug Use: No  . Sexual Activity: Not on file   Other Topics Concern  . Not on file   Social History Narrative     ROS: [x]  Positive   [ ]  Negative   [ ]  All sytems reviewed and are negative  Cardiovascular: []  chest pain/pressure []  palpitations []  SOB lying flat [x]  DOE-has COPD []  pain in legs while walking []  pain in feet when lying flat []  hx of DVT []  hx of phlebitis []  swelling in legs []  varicose veins  Pulmonary: [x]  productive cough-has COPD []  asthma [x]  wheezing-has COPD  Neurologic: []  weakness in []  arms []  legs []  numbness  in []  arms []  legs [] difficulty speaking or slurred speech []  temporary loss of vision in one eye []  dizziness  Hematologic: []  bleeding problems []  problems with blood clotting easily  GI []  vomiting blood []  blood in stool  GU: []   burning with urination []  blood in urine  Psychiatric: []  hx of major depression  Integumentary: []  rashes []  ulcers  Constitutional: []  fever []  chills   PHYSICAL EXAMINATION:  Filed Vitals:   12/16/15 1307 12/16/15 1316  BP: 161/59 142/64  Pulse: 58 58  Temp: 98.2 F (36.8 C)   Resp: 14    Body mass index is 25.08 kg/(m^2).  General:  WDWN in NAD Gait: Not observed HENT: WNL, normocephalic Pulmonary: normal non-labored breathing , without Rales, rhonchi,  wheezing Cardiac: RRR, without  Murmurs, rubs or gallops; with right carotid bruit Abdomen: soft, NT, no masses Skin: without rashes Vascular Exam/Pulses:  Right Left  Radial 2+ (normal) 2+ (normal)  Ulnar 2+ (normal) 2+ (normal)  Femoral 2+ (normal) 2+ (normal)  DP 1+ (weak) Unable to palpate   PT 2+ (normal) 2+ (normal)   Extremities: without ischemic changes, without Gangrene , without cellulitis; without open wounds;  Musculoskeletal: no muscle wasting or atrophy  Neurologic: A&O X 3; Appropriate Affect ; SENSATION: normal; MOTOR FUNCTION:  moving all extremities equally. Speech is fluent/normal   Non-Invasive Vascular Imaging:   Carotid duplex 12/16/15: -Bilateral ICA stenosis is less than 40% -bilateral ECA present greater than 50%  ABI's 12/16/15: Right:  1.10 Left:  0.93  TBI's 12/16/15: Right:  >0.70 Left:  >0.70  Pt meds includes: Statin:  Yes.   Beta Blocker:  Yes.   Aspirin:  No. ACEI:  No. ARB:  Yes.   Other Antiplatelet/Anticoagulant:  Yes.   Plavix   ASSESSMENT/PLAN:: 69 y.o. male with hx of aortobifemoral bypass grafting and left femoropopliteal bypass grafting in the past presents today for follow up scans.   -his carotid artery stenosis is  essentially unchanged from one year ago and he remains asymptomatic.  (he does have a right carotid bruit present). -he continues to have palpable pedal pulses and normal ABI's.  He did not have a scan of his bypass graft today-we will order one for next year when he returns. -Dr. Scot Dock discussed adding a baby aspirin to his medical regimen -he has had an intentional weight loss of 60lbs -he will follow up in one year but will call sooner if he has any issues.    Leontine Locket, PA-C [facility] [phone]  Clinic MD:  Pt seen and examined in conjunction with Dr. Scot Dock

## 2015-12-17 ENCOUNTER — Ambulatory Visit (INDEPENDENT_AMBULATORY_CARE_PROVIDER_SITE_OTHER): Payer: Medicare Other | Admitting: Podiatry

## 2015-12-17 ENCOUNTER — Encounter: Payer: Self-pay | Admitting: Podiatry

## 2015-12-17 VITALS — BP 143/66 | HR 61 | Ht 72.0 in | Wt 184.0 lb

## 2015-12-17 DIAGNOSIS — B351 Tinea unguium: Secondary | ICD-10-CM | POA: Diagnosis not present

## 2015-12-17 DIAGNOSIS — M79606 Pain in leg, unspecified: Secondary | ICD-10-CM | POA: Diagnosis not present

## 2015-12-17 NOTE — Progress Notes (Signed)
Subjective:  71 year old male presents requesting toe nails and calluses trimmed. Blood sugar is under control.  Objective: Nails are hypertrophic.  Thick plantar heel calluses bilateral.  Pedal pulses are not palpable today. High arch foot without any gross deformities.   Assessment: Mycotic nails x 10.  PVD left lower limb.  Painful feet.  Plan: Debrided all nails.  Return in 3 months or as needed.

## 2015-12-17 NOTE — Addendum Note (Signed)
Addended by: Dorthula Rue L on: 12/17/2015 08:32 AM   Modules accepted: Orders

## 2015-12-17 NOTE — Patient Instructions (Signed)
Seen for hypertrophic nails. All nails debrided. Return in 3 months or as needed.  

## 2015-12-18 ENCOUNTER — Ambulatory Visit: Payer: Medicare Other | Admitting: Podiatry

## 2016-02-18 DIAGNOSIS — N08 Glomerular disorders in diseases classified elsewhere: Secondary | ICD-10-CM | POA: Diagnosis not present

## 2016-02-18 DIAGNOSIS — N182 Chronic kidney disease, stage 2 (mild): Secondary | ICD-10-CM | POA: Diagnosis not present

## 2016-02-18 DIAGNOSIS — E1122 Type 2 diabetes mellitus with diabetic chronic kidney disease: Secondary | ICD-10-CM | POA: Diagnosis not present

## 2016-02-18 DIAGNOSIS — E785 Hyperlipidemia, unspecified: Secondary | ICD-10-CM | POA: Diagnosis not present

## 2016-02-18 DIAGNOSIS — I1310 Hypertensive heart and chronic kidney disease without heart failure, with stage 1 through stage 4 chronic kidney disease, or unspecified chronic kidney disease: Secondary | ICD-10-CM | POA: Diagnosis not present

## 2016-02-18 DIAGNOSIS — E291 Testicular hypofunction: Secondary | ICD-10-CM | POA: Diagnosis not present

## 2016-03-07 DIAGNOSIS — Z79899 Other long term (current) drug therapy: Secondary | ICD-10-CM | POA: Diagnosis not present

## 2016-03-16 ENCOUNTER — Ambulatory Visit (INDEPENDENT_AMBULATORY_CARE_PROVIDER_SITE_OTHER): Payer: Medicare Other | Admitting: Podiatry

## 2016-03-16 ENCOUNTER — Encounter: Payer: Self-pay | Admitting: Podiatry

## 2016-03-16 VITALS — BP 160/66 | HR 61

## 2016-03-16 DIAGNOSIS — M79605 Pain in left leg: Secondary | ICD-10-CM

## 2016-03-16 DIAGNOSIS — B351 Tinea unguium: Secondary | ICD-10-CM

## 2016-03-16 NOTE — Progress Notes (Signed)
Subjective:  70 year old male presents requesting toe nails and calluses trimmed. Blood sugar is under control.  Objective: Nails are hypertrophic.  Thick plantar heel calluses bilateral.  Pedal pulses are not palpable today. High arch foot without any gross deformities.   Assessment: Mycotic nails x 10.  PVD left lower limb.  Painful feet.  Plan: Debrided all nails.  Return in 3 months or as needed.

## 2016-03-16 NOTE — Patient Instructions (Signed)
Seen for hypertrophic nails. All nails debrided. Return in 3 months or as needed.  

## 2016-04-12 DIAGNOSIS — R972 Elevated prostate specific antigen [PSA]: Secondary | ICD-10-CM | POA: Diagnosis not present

## 2016-04-19 DIAGNOSIS — Z0000 Encounter for general adult medical examination without abnormal findings: Secondary | ICD-10-CM | POA: Diagnosis not present

## 2016-04-19 DIAGNOSIS — E291 Testicular hypofunction: Secondary | ICD-10-CM | POA: Diagnosis not present

## 2016-04-19 DIAGNOSIS — R972 Elevated prostate specific antigen [PSA]: Secondary | ICD-10-CM | POA: Diagnosis not present

## 2016-05-23 DIAGNOSIS — E039 Hypothyroidism, unspecified: Secondary | ICD-10-CM | POA: Diagnosis not present

## 2016-05-23 DIAGNOSIS — N183 Chronic kidney disease, stage 3 (moderate): Secondary | ICD-10-CM | POA: Diagnosis not present

## 2016-05-23 DIAGNOSIS — E1122 Type 2 diabetes mellitus with diabetic chronic kidney disease: Secondary | ICD-10-CM | POA: Diagnosis not present

## 2016-05-23 DIAGNOSIS — N08 Glomerular disorders in diseases classified elsewhere: Secondary | ICD-10-CM | POA: Diagnosis not present

## 2016-05-23 DIAGNOSIS — I6529 Occlusion and stenosis of unspecified carotid artery: Secondary | ICD-10-CM | POA: Diagnosis not present

## 2016-06-15 ENCOUNTER — Ambulatory Visit (INDEPENDENT_AMBULATORY_CARE_PROVIDER_SITE_OTHER): Payer: Medicare Other | Admitting: Podiatry

## 2016-06-15 ENCOUNTER — Encounter: Payer: Self-pay | Admitting: Podiatry

## 2016-06-15 VITALS — BP 176/73 | HR 58

## 2016-06-15 DIAGNOSIS — M79606 Pain in leg, unspecified: Secondary | ICD-10-CM | POA: Diagnosis not present

## 2016-06-15 DIAGNOSIS — B351 Tinea unguium: Secondary | ICD-10-CM | POA: Diagnosis not present

## 2016-06-15 DIAGNOSIS — I739 Peripheral vascular disease, unspecified: Secondary | ICD-10-CM | POA: Diagnosis not present

## 2016-06-15 NOTE — Progress Notes (Signed)
Subjective:  70 year old male presents requesting toe nails and calluses trimmed. Blood sugar is under control.  Objective: Nails are hypertrophic.  Thick plantar heel calluses bilateral.  Pedal pulses are not palpable today. High arch foot without any gross deformities.   Assessment: Mycotic nails x 10.  PVD left lower limb.  Painful feet.  Plan: Debrided all nails.  Return in 3 months or as needed.

## 2016-06-15 NOTE — Patient Instructions (Signed)
Seen for hypertrophic nails and calluses. All nails and calluses debrided. Return in 3 months or as needed.  

## 2016-08-22 ENCOUNTER — Ambulatory Visit (INDEPENDENT_AMBULATORY_CARE_PROVIDER_SITE_OTHER): Payer: Medicare Other | Admitting: Otolaryngology

## 2016-09-01 DIAGNOSIS — Z23 Encounter for immunization: Secondary | ICD-10-CM | POA: Diagnosis not present

## 2016-09-15 ENCOUNTER — Ambulatory Visit (INDEPENDENT_AMBULATORY_CARE_PROVIDER_SITE_OTHER): Payer: Medicare Other | Admitting: Podiatry

## 2016-09-15 ENCOUNTER — Encounter: Payer: Self-pay | Admitting: Podiatry

## 2016-09-15 DIAGNOSIS — I739 Peripheral vascular disease, unspecified: Secondary | ICD-10-CM

## 2016-09-15 DIAGNOSIS — M79672 Pain in left foot: Secondary | ICD-10-CM

## 2016-09-15 DIAGNOSIS — B351 Tinea unguium: Secondary | ICD-10-CM

## 2016-09-15 DIAGNOSIS — M79671 Pain in right foot: Secondary | ICD-10-CM | POA: Diagnosis not present

## 2016-09-15 NOTE — Progress Notes (Signed)
Subjective:  70 year old male presents requesting toe nails and calluses trimmed. Blood sugar was 120 this morning. He started to use Insulin pen. Been physically active and having no medical issues.  Objective: Nails are hypertrophic.  Thick keratotic tissue build up under 5th MPJ bilateral, plantar heel bilateral, painful. Pedal pulses are not palpable. High arch foot without any gross deformities.   Assessment: Mycotic nails x 10.  PVD left lower limb.  Painful feet with thick nails and heel calluses. Controlled IDDM.  Plan: Debrided all nails and plantar calluses under 5th MPJ and both heel..  Return in 3 months or as needed.

## 2016-09-15 NOTE — Patient Instructions (Signed)
Seen for hypertrophic nails and calluses. All nails and calluses debrided. No new findings. Return in 3 months or as needed.

## 2016-09-16 DIAGNOSIS — D333 Benign neoplasm of cranial nerves: Secondary | ICD-10-CM | POA: Diagnosis not present

## 2016-09-16 DIAGNOSIS — H838X3 Other specified diseases of inner ear, bilateral: Secondary | ICD-10-CM | POA: Diagnosis not present

## 2016-10-05 DIAGNOSIS — N183 Chronic kidney disease, stage 3 (moderate): Secondary | ICD-10-CM | POA: Diagnosis not present

## 2016-10-05 DIAGNOSIS — J441 Chronic obstructive pulmonary disease with (acute) exacerbation: Secondary | ICD-10-CM | POA: Diagnosis not present

## 2016-10-05 DIAGNOSIS — E559 Vitamin D deficiency, unspecified: Secondary | ICD-10-CM | POA: Diagnosis not present

## 2016-10-05 DIAGNOSIS — Z0001 Encounter for general adult medical examination with abnormal findings: Secondary | ICD-10-CM | POA: Diagnosis not present

## 2016-10-05 DIAGNOSIS — I1310 Hypertensive heart and chronic kidney disease without heart failure, with stage 1 through stage 4 chronic kidney disease, or unspecified chronic kidney disease: Secondary | ICD-10-CM | POA: Diagnosis not present

## 2016-10-05 DIAGNOSIS — E1122 Type 2 diabetes mellitus with diabetic chronic kidney disease: Secondary | ICD-10-CM | POA: Diagnosis not present

## 2016-10-05 DIAGNOSIS — I70213 Atherosclerosis of native arteries of extremities with intermittent claudication, bilateral legs: Secondary | ICD-10-CM | POA: Diagnosis not present

## 2016-10-18 DIAGNOSIS — R972 Elevated prostate specific antigen [PSA]: Secondary | ICD-10-CM | POA: Diagnosis not present

## 2016-10-25 DIAGNOSIS — R972 Elevated prostate specific antigen [PSA]: Secondary | ICD-10-CM | POA: Diagnosis not present

## 2016-10-25 DIAGNOSIS — N400 Enlarged prostate without lower urinary tract symptoms: Secondary | ICD-10-CM | POA: Diagnosis not present

## 2016-10-31 DIAGNOSIS — M5137 Other intervertebral disc degeneration, lumbosacral region: Secondary | ICD-10-CM | POA: Diagnosis not present

## 2016-10-31 DIAGNOSIS — M9903 Segmental and somatic dysfunction of lumbar region: Secondary | ICD-10-CM | POA: Diagnosis not present

## 2016-10-31 DIAGNOSIS — M5414 Radiculopathy, thoracic region: Secondary | ICD-10-CM | POA: Diagnosis not present

## 2016-10-31 DIAGNOSIS — M9902 Segmental and somatic dysfunction of thoracic region: Secondary | ICD-10-CM | POA: Diagnosis not present

## 2016-10-31 DIAGNOSIS — M5417 Radiculopathy, lumbosacral region: Secondary | ICD-10-CM | POA: Diagnosis not present

## 2016-10-31 DIAGNOSIS — M9905 Segmental and somatic dysfunction of pelvic region: Secondary | ICD-10-CM | POA: Diagnosis not present

## 2016-12-08 ENCOUNTER — Encounter: Payer: Self-pay | Admitting: Vascular Surgery

## 2016-12-14 ENCOUNTER — Ambulatory Visit (INDEPENDENT_AMBULATORY_CARE_PROVIDER_SITE_OTHER): Payer: Medicare Other | Admitting: Podiatry

## 2016-12-14 ENCOUNTER — Encounter: Payer: Self-pay | Admitting: Podiatry

## 2016-12-14 VITALS — BP 165/64 | HR 66

## 2016-12-14 DIAGNOSIS — M79672 Pain in left foot: Secondary | ICD-10-CM

## 2016-12-14 DIAGNOSIS — M79671 Pain in right foot: Secondary | ICD-10-CM | POA: Diagnosis not present

## 2016-12-14 DIAGNOSIS — B351 Tinea unguium: Secondary | ICD-10-CM

## 2016-12-14 NOTE — Progress Notes (Signed)
Subjective:  71 year old male presents requesting toe nails and calluses trimmed. Blood sugar was 124 this morning. +Been physically active and having no medical issues.  Objective: Nails are hypertrophic.  Thick keratotic tissue build up under 5th MPJ bilateral, plantar heel bilateral, painful. Pedal pulses are not palpable. High arch foot without any gross deformities.   Assessment: Mycotic nails x 10.  PVD left lower limb.  Painful feet with thick nails and heel calluses. Controlled IDDM.  Plan: Debrided all nails and plantar calluses under 5th MPJ and both heel..  Return in 3 months or as needed.

## 2016-12-14 NOTE — Patient Instructions (Signed)
Seen for hypertrophic nails. All nails debrided. Return in 3 months or as needed.  

## 2016-12-15 ENCOUNTER — Ambulatory Visit: Payer: Medicare Other | Admitting: Podiatry

## 2016-12-21 ENCOUNTER — Encounter (HOSPITAL_COMMUNITY): Payer: Medicare Other

## 2016-12-21 ENCOUNTER — Ambulatory Visit: Payer: Medicare Other | Admitting: Vascular Surgery

## 2016-12-21 ENCOUNTER — Ambulatory Visit (INDEPENDENT_AMBULATORY_CARE_PROVIDER_SITE_OTHER): Payer: Medicare Other | Admitting: Vascular Surgery

## 2016-12-21 ENCOUNTER — Encounter: Payer: Self-pay | Admitting: Vascular Surgery

## 2016-12-21 ENCOUNTER — Ambulatory Visit (INDEPENDENT_AMBULATORY_CARE_PROVIDER_SITE_OTHER)
Admission: RE | Admit: 2016-12-21 | Discharge: 2016-12-21 | Disposition: A | Discharge status: Home / Self Care | Payer: Medicare Other | Source: Ambulatory Visit | Attending: Vascular Surgery | Admitting: Vascular Surgery

## 2016-12-21 ENCOUNTER — Ambulatory Visit (HOSPITAL_COMMUNITY)
Admission: RE | Admit: 2016-12-21 | Discharge: 2016-12-21 | Disposition: A | Discharge status: Home / Self Care | Payer: Medicare Other | Source: Ambulatory Visit | Attending: Vascular Surgery | Admitting: Vascular Surgery

## 2016-12-21 VITALS — BP 147/81 | HR 62 | Temp 97.1°F | Resp 16 | Ht 72.0 in | Wt 196.0 lb

## 2016-12-21 DIAGNOSIS — I739 Peripheral vascular disease, unspecified: Secondary | ICD-10-CM | POA: Insufficient documentation

## 2016-12-21 DIAGNOSIS — I6523 Occlusion and stenosis of bilateral carotid arteries: Secondary | ICD-10-CM | POA: Diagnosis not present

## 2016-12-21 LAB — VAS US CAROTID
LEFT ECA DIAS: -14 cm/s
Left CCA dist dias: -24 cm/s
Left CCA dist sys: -119 cm/s
Left CCA prox dias: 19 cm/s
Left CCA prox sys: 85 cm/s
Left ICA dist dias: -17 cm/s
Left ICA dist sys: -58 cm/s
Left ICA prox dias: -25 cm/s
Left ICA prox sys: -106 cm/s
RIGHT CCA MID DIAS: 16 cm/s
RIGHT ECA DIAS: -30 cm/s
Right CCA prox dias: 14 cm/s
Right CCA prox sys: 90 cm/s
Right cca dist sys: -62 cm/s

## 2016-12-21 NOTE — Progress Notes (Signed)
Patient name: Sean Saunders MRN: [id number] DOB: 19-Aug-1946 Sex: male  REASON FOR VISIT: Follow up of carotid disease and peripheral vascular disease.  HPI: Sean Saunders is a 71 y.o. male who was undergone a previous aortobifemoral bypass graft in 2006 and a left femoropopliteal bypass graft in 2012. I've been also following carotid disease. I last saw him in the office one year ago. At that time his ABI on the right was 100% and on the left 93%. He had a less than 40% carotid stenosis bilaterally. He comes in for one-year follow up visit.  He denies any claudication rest pain or history of nonhealing ulcers.  He denies any history of stroke, TIAs, expressive or receptive aphasia, or amaurosis fugax.  Past Medical History:  Diagnosis Date  . Bronchitis    hx of  . COPD (chronic obstructive pulmonary disease) ([place])   . Diabetes mellitus   . DVT (deep venous thrombosis) ([place])   . GERD (gastroesophageal reflux disease)   . Hyperlipidemia   . Hypertension   . Hypothyroidism   . Onychomycosis 06/07/2013  . Stroke [facility])    TIA's 01/05/11-01/08/11-01/26/11  . Thyroid disease   . TIA (transient ischemic attack) 08/03/2011   Left sided weakness. 3 episodes Feb-March  . TIA (transient ischemic attack)    x 3    Family History  Problem Relation Age of Onset  . Stroke Mother   . Diabetes Mother   . Other Father     alzheimers  . Diabetes Father   . Stroke Father   . Diabetes Sister     SOCIAL HISTORY: Social History  Substance Use Topics  . Smoking status: Former Smoker    Packs/day: 0.50    Years: 5.00    Types: Cigarettes    Quit date: 08/02/2011  . Smokeless tobacco: Never Used  . Alcohol use 0.0 oz/week     Comment: rare    Allergies  Allergen Reactions  . Eszopiclone Itching  . Niaspan [Niacin Er] Nausea And Vomiting and Rash    Current Outpatient Prescriptions  Medication Sig Dispense Refill  . albuterol-ipratropium (COMBIVENT) 18-103 MCG/ACT inhaler Inhale  1 puff into the lungs every morning.    Marland Kitchen amLODipine (NORVASC) 10 MG tablet Take 10 mg by mouth daily.    . budesonide-formoterol (SYMBICORT) 160-4.5 MCG/ACT inhaler Inhale 2 puffs into the lungs as needed. Reported on 12/16/2015    . Calcium-Magnesium 500-250 MG TABS Take 1 tablet by mouth daily.    . Cholecalciferol (VITAMIN D) 2000 UNITS CAPS Take 2,000 Units by mouth daily.    . clopidogrel (PLAVIX) 75 MG tablet Take 75 mg by mouth daily.      . furosemide (LASIX) 20 MG tablet Take 20 mg by mouth daily.     . insulin regular human CONCENTRATED (HUMULIN R) 500 UNIT/ML SOLN injection Inject 10 Units into the skin 2 (two) times daily.     Marland Kitchen levothyroxine (SYNTHROID, LEVOTHROID) 137 MCG tablet Take 137 mcg by mouth daily.      . metFORMIN (GLUMETZA) 1000 MG (MOD) 24 hr tablet Take 1,000 mg by mouth 2 (two) times daily.    . Multiple Vitamin (MULTIVITAMIN WITH MINERALS) TABS tablet Take 1 tablet by mouth daily.    . nebivolol (BYSTOLIC) 5 MG tablet Take 5 mg by mouth daily.    . rosuvastatin (CRESTOR) 20 MG tablet Take 20 mg by mouth daily.    . sitaGLIPtin (JANUVIA) 100 MG tablet Take 100 mg by mouth  daily.      . Testosterone 10 MG/ACT (2%) GEL daily.  0  . valsartan (DIOVAN) 320 MG tablet Take 320 mg by mouth daily.    . ciprofloxacin (CIPRO) 500 MG tablet Take 1 tablet (500 mg total) by mouth 2 (two) times daily. (Patient not taking: Reported on 12/21/2016) 28 tablet 0  . ciprofloxacin (CIPRO) 500 MG tablet Take 1 tablet (500 mg total) by mouth 2 (two) times daily. (Patient not taking: Reported on 12/21/2016) 20 tablet 0  . traMADol (ULTRAM) 50 MG tablet Take 1 tablet (50 mg total) by mouth every 6 (six) hours as needed for moderate pain or severe pain. (Patient not taking: Reported on 12/21/2016) 20 tablet 0   No current facility-administered medications for this visit.     REVIEW OF SYSTEMS:  [X]  denotes positive finding, [ ]  denotes negative finding Cardiac  Comments:  Chest pain or chest  pressure:    Shortness of breath upon exertion: X   Short of breath when lying flat:    Irregular heart rhythm:        Vascular    Pain in calf, thigh, or hip brought on by ambulation:    Pain in feet at night that wakes you up from your sleep:     Blood clot in your veins:    Leg swelling:         Pulmonary    Oxygen at home:    Productive cough:     Wheezing:         Neurologic    Sudden weakness in arms or legs:     Sudden numbness in arms or legs:     Sudden onset of difficulty speaking or slurred speech:    Temporary loss of vision in one eye:     Problems with dizziness:         Gastrointestinal    Blood in stool:     Vomited blood:         Genitourinary    Burning when urinating:     Blood in urine:        Psychiatric    Major depression:         Hematologic    Bleeding problems:    Problems with blood clotting too easily:        Skin    Rashes or ulcers:        Constitutional    Fever or chills:      PHYSICAL EXAM: Vitals:   12/21/16 1359  BP: (!) 147/81  Pulse: 62  Resp: 16  Temp: 97.1 F (36.2 C)  TempSrc: Oral  SpO2: 98%  Weight: 196 lb (88.9 kg)  Height: 6' (1.829 m)    GENERAL: The patient is a well-nourished male, in no acute distress. The vital signs are documented above. CARDIAC: There is a regular rate and rhythm.  VASCULAR: He has a right carotid bruit. He has palpable femoral and posterior tibial pulses bilaterally. PULMONARY: There is good air exchange bilaterally without wheezing or rales. ABDOMEN: Soft and non-tender with normal pitched bowel sounds.  MUSCULOSKELETAL: There are no major deformities or cyanosis. NEUROLOGIC: No focal weakness or paresthesias are detected. SKIN: There are no ulcers or rashes noted. PSYCHIATRIC: The patient has a normal affect.  DATA:   LEFT LOWER EXTREMITY DUPLEX: I have independently interpreted his left lower extremity arterial duplex can which shows that his bypass graft is patent on the  left without areas of stenosis noted. He has  biphasic Doppler signals throughout.  Bilateral lower extremity Doppler study: I have independently interpreted his lower extremity arterial Doppler study. He has an ABI of her percent on the right and 99% on the left.  CAROTID DUPLEX: I have independently interpreted his carotid duplex scan. He has a less than 39% carotid stenosis bilaterally.  MEDICAL ISSUES:  STATUS POST AORTOBIFEMORAL BYPASS GRAFT AND LEFT FEMOROPOPLITEAL BYPASS GRAFT: His bypass grafts are patent. He is not a smoker. I have encouraged him to stay as active as possible. I bordered follow up ABIs are graft duplex in 1 year and I'll see him back at that time.  BILATERAL CAROTID DISEASE: He has mild carotid disease bilaterally which has been stable. He has a less than 39% stenosis bilaterally. He is asymptomatic. I think it is safe to stretch his follow up out to 2 years and I have ordered a carotid duplex scan in 2 years. He is on Plavix. He is on a statin.     Deitra Mayo [facility] [place] [facility] [phone]

## 2016-12-22 NOTE — Addendum Note (Signed)
Addended by: Lianne Cure A on: 12/22/2016 12:08 PM   Modules accepted: Orders

## 2017-03-14 ENCOUNTER — Encounter: Payer: Self-pay | Admitting: Podiatry

## 2017-03-14 ENCOUNTER — Ambulatory Visit (INDEPENDENT_AMBULATORY_CARE_PROVIDER_SITE_OTHER): Payer: Medicare Other | Admitting: Podiatry

## 2017-03-14 VITALS — BP 190/77 | HR 62

## 2017-03-14 DIAGNOSIS — M79671 Pain in right foot: Secondary | ICD-10-CM | POA: Diagnosis not present

## 2017-03-14 DIAGNOSIS — M79672 Pain in left foot: Secondary | ICD-10-CM

## 2017-03-14 DIAGNOSIS — I739 Peripheral vascular disease, unspecified: Secondary | ICD-10-CM

## 2017-03-14 DIAGNOSIS — B351 Tinea unguium: Secondary | ICD-10-CM

## 2017-03-14 NOTE — Progress Notes (Signed)
Subjective:  71 year old male presents requesting toe nails and calluses trimmed. Blood sugar is under control but still smokes. Been physically active with his new wife.   Objective: Nails are hypertrophic.  Thick keratotic tissue build up under 5th MPJ bilateral, plantar heel bilateral, painful. Pedal pulses are not palpable. High arch foot without any gross deformities.   Assessment: Mycotic nails x 10.  PVD left lower limb.  Painful feet with thick nails and heel calluses. Controlled IDDM.  Plan: Debrided all nails. Counseling given on cigarette.  Return in 3 months or as needed.

## 2017-03-14 NOTE — Patient Instructions (Signed)
Seen for hypertrophic nails. All nails debrided. Return in 3 months or as needed.  

## 2017-03-16 DIAGNOSIS — I70213 Atherosclerosis of native arteries of extremities with intermittent claudication, bilateral legs: Secondary | ICD-10-CM | POA: Diagnosis not present

## 2017-03-16 DIAGNOSIS — N183 Chronic kidney disease, stage 3 (moderate): Secondary | ICD-10-CM | POA: Diagnosis not present

## 2017-03-16 DIAGNOSIS — I1310 Hypertensive heart and chronic kidney disease without heart failure, with stage 1 through stage 4 chronic kidney disease, or unspecified chronic kidney disease: Secondary | ICD-10-CM | POA: Diagnosis not present

## 2017-03-16 DIAGNOSIS — J309 Allergic rhinitis, unspecified: Secondary | ICD-10-CM | POA: Diagnosis not present

## 2017-03-16 DIAGNOSIS — E1122 Type 2 diabetes mellitus with diabetic chronic kidney disease: Secondary | ICD-10-CM | POA: Diagnosis not present

## 2017-03-22 DIAGNOSIS — D320 Benign neoplasm of cerebral meninges: Secondary | ICD-10-CM | POA: Diagnosis not present

## 2017-03-23 DIAGNOSIS — D320 Benign neoplasm of cerebral meninges: Secondary | ICD-10-CM | POA: Diagnosis not present

## 2017-03-24 DIAGNOSIS — I10 Essential (primary) hypertension: Secondary | ICD-10-CM | POA: Diagnosis not present

## 2017-03-24 DIAGNOSIS — D320 Benign neoplasm of cerebral meninges: Secondary | ICD-10-CM | POA: Diagnosis not present

## 2017-03-24 DIAGNOSIS — Z6828 Body mass index (BMI) 28.0-28.9, adult: Secondary | ICD-10-CM | POA: Diagnosis not present

## 2017-03-24 DIAGNOSIS — D333 Benign neoplasm of cranial nerves: Secondary | ICD-10-CM | POA: Diagnosis not present

## 2017-03-29 DIAGNOSIS — E875 Hyperkalemia: Secondary | ICD-10-CM | POA: Diagnosis not present

## 2017-04-11 DIAGNOSIS — E875 Hyperkalemia: Secondary | ICD-10-CM | POA: Diagnosis not present

## 2017-04-19 DIAGNOSIS — R972 Elevated prostate specific antigen [PSA]: Secondary | ICD-10-CM | POA: Diagnosis not present

## 2017-04-26 DIAGNOSIS — R972 Elevated prostate specific antigen [PSA]: Secondary | ICD-10-CM | POA: Diagnosis not present

## 2017-04-26 DIAGNOSIS — N5201 Erectile dysfunction due to arterial insufficiency: Secondary | ICD-10-CM | POA: Diagnosis not present

## 2017-06-13 ENCOUNTER — Encounter: Payer: Self-pay | Admitting: Podiatry

## 2017-06-13 ENCOUNTER — Ambulatory Visit (INDEPENDENT_AMBULATORY_CARE_PROVIDER_SITE_OTHER): Payer: Medicare Other | Admitting: Podiatry

## 2017-06-13 VITALS — BP 156/74 | HR 61

## 2017-06-13 DIAGNOSIS — M79672 Pain in left foot: Secondary | ICD-10-CM | POA: Diagnosis not present

## 2017-06-13 DIAGNOSIS — B351 Tinea unguium: Secondary | ICD-10-CM

## 2017-06-13 DIAGNOSIS — M79671 Pain in right foot: Secondary | ICD-10-CM

## 2017-06-13 NOTE — Progress Notes (Signed)
Subjective:  71 year old male presents requesting toe nails and calluses trimmed. Blood sugar is under control but still smokes. Been physically active with his new wife.  Denies any new problems.  Objective: Nails are hypertrophic.  Thick keratotic tissue build up under 5th MPJ bilateral, plantar heel bilateral, painful. Pedal pulses are not palpable. High arch foot without any gross deformities.   Assessment: Mycotic nails x 10.  PVD left lower limb.  Painful feet with thick nails and heel calluses. Controlled IDDM.  Plan: Debrided all nails. Counseling given on cigarette.  Return in 3 months or as needed.

## 2017-06-13 NOTE — Patient Instructions (Signed)
Seen for pain under the ball and heel of both feet, and hypertrophic nails. All nails and calluses debrided. May benefit from diabetic shoes to add more cushion on balls and heels. Return in 3 months or as needed.

## 2017-07-03 ENCOUNTER — Other Ambulatory Visit: Payer: Self-pay

## 2017-08-25 DIAGNOSIS — N182 Chronic kidney disease, stage 2 (mild): Secondary | ICD-10-CM | POA: Diagnosis not present

## 2017-08-25 DIAGNOSIS — J441 Chronic obstructive pulmonary disease with (acute) exacerbation: Secondary | ICD-10-CM | POA: Diagnosis not present

## 2017-08-25 DIAGNOSIS — I70213 Atherosclerosis of native arteries of extremities with intermittent claudication, bilateral legs: Secondary | ICD-10-CM | POA: Diagnosis not present

## 2017-08-25 DIAGNOSIS — I1310 Hypertensive heart and chronic kidney disease without heart failure, with stage 1 through stage 4 chronic kidney disease, or unspecified chronic kidney disease: Secondary | ICD-10-CM | POA: Diagnosis not present

## 2017-09-04 ENCOUNTER — Encounter: Payer: Self-pay | Admitting: Family Medicine

## 2017-09-04 ENCOUNTER — Encounter: Payer: Self-pay | Admitting: Gastroenterology

## 2017-09-04 ENCOUNTER — Ambulatory Visit (INDEPENDENT_AMBULATORY_CARE_PROVIDER_SITE_OTHER)
Admission: RE | Admit: 2017-09-04 | Discharge: 2017-09-04 | Disposition: A | Discharge status: Home / Self Care | Payer: Medicare Other | Source: Ambulatory Visit | Attending: Family Medicine | Admitting: Family Medicine

## 2017-09-04 ENCOUNTER — Ambulatory Visit (INDEPENDENT_AMBULATORY_CARE_PROVIDER_SITE_OTHER): Payer: Medicare Other | Admitting: Family Medicine

## 2017-09-04 VITALS — BP 160/80 | HR 77 | Resp 12 | Ht 72.0 in | Wt 204.1 lb

## 2017-09-04 DIAGNOSIS — I119 Hypertensive heart disease without heart failure: Secondary | ICD-10-CM | POA: Diagnosis not present

## 2017-09-04 DIAGNOSIS — E1151 Type 2 diabetes mellitus with diabetic peripheral angiopathy without gangrene: Secondary | ICD-10-CM | POA: Diagnosis not present

## 2017-09-04 DIAGNOSIS — IMO0002 Reserved for concepts with insufficient information to code with codable children: Secondary | ICD-10-CM

## 2017-09-04 DIAGNOSIS — I739 Peripheral vascular disease, unspecified: Secondary | ICD-10-CM | POA: Diagnosis not present

## 2017-09-04 DIAGNOSIS — Z1211 Encounter for screening for malignant neoplasm of colon: Secondary | ICD-10-CM

## 2017-09-04 DIAGNOSIS — I6523 Occlusion and stenosis of bilateral carotid arteries: Secondary | ICD-10-CM

## 2017-09-04 DIAGNOSIS — Z794 Long term (current) use of insulin: Secondary | ICD-10-CM

## 2017-09-04 DIAGNOSIS — J441 Chronic obstructive pulmonary disease with (acute) exacerbation: Secondary | ICD-10-CM | POA: Diagnosis not present

## 2017-09-04 DIAGNOSIS — R05 Cough: Secondary | ICD-10-CM | POA: Diagnosis not present

## 2017-09-04 DIAGNOSIS — E1165 Type 2 diabetes mellitus with hyperglycemia: Secondary | ICD-10-CM

## 2017-09-04 MED ORDER — PREDNISONE 20 MG PO TABS: 40.0000 mg | ORAL_TABLET | Freq: Every day | ORAL | 0 refills | Status: AC

## 2017-09-04 MED ORDER — IPRATROPIUM-ALBUTEROL 0.5-2.5 (3) MG/3ML IN SOLN
3.0000 mL | Freq: Once | RESPIRATORY_TRACT | Status: AC
  Administered 2017-09-04: 3 mL via RESPIRATORY_TRACT

## 2017-09-04 MED ORDER — IPRATROPIUM-ALBUTEROL 0.5-2.5 (3) MG/3ML IN SOLN: 3.0000 mL | Freq: Three times a day (TID) | RESPIRATORY_TRACT | 0 refills | Status: DC

## 2017-09-04 MED ORDER — DOXYCYCLINE HYCLATE 100 MG PO TABS: 100.0000 mg | ORAL_TABLET | Freq: Two times a day (BID) | ORAL | 0 refills | Status: AC

## 2017-09-04 MED ORDER — PREDNISONE 20 MG PO TABS: 40.0000 mg | ORAL_TABLET | Freq: Every day | ORAL | 0 refills | Status: DC

## 2017-09-04 NOTE — Patient Instructions (Addendum)
A few things to remember from today's visit:   COPD with acute exacerbation ([place]) - Plan: ipratropium-albuterol (DUONEB) 0.5-2.5 (3) MG/3ML nebulizer solution 3 mL, doxycycline (VIBRA-TABS) 100 MG tablet, DG Chest 2 View, ipratropium-albuterol (DUONEB) 0.5-2.5 (3) MG/3ML SOLN, predniSONE (DELTASONE) 20 MG tablet, DISCONTINUED: predniSONE (DELTASONE) 20 MG tablet  PVD (peripheral vascular disease) ([place])  Hypertension with heart disease  Because history of hypertension you cannot take current medications.  Saline irrigations as needed. Plain Mucinex might help with mucus. Avoid Combivent while using nebulizer medication.  I will see her back in 1-2 weeks, before if symptoms get worse.   Please work on smoking cessation.  Keep appointment with the [facility] for pressure check.  Please be sure medication list is accurate. If a new problem present, please set up appointment sooner than planned today.

## 2017-09-04 NOTE — Progress Notes (Signed)
HPI:   Sean Saunders is a 71 y.o. male, who is here today to establish care.  Former PCP: Dr Baird Cancer. He also goes to the [place] periodically. Last preventive routine visit: 2 years ago.  Chronic medical problems: PAD ( Dr Scot Dock), DM II,hypothyroidism,hypogonadism on testosterone replacement therapy,COPD,TIA,HTN, HLD, and tobacco use among some.  DM II and hypothyroidism: He follows with endocrinologist through the [facility] every 3 months.   Hypertension:   Dx about 20+ years. Currently on Valsartan 320 mg daily, Amlodipine 10 mg daily and HCTZ 25 mg daily.   Home BP readings: 170s/70s. According to patient medications have been adjusted through the [place], he is following every 10 days and next appointment is tomorrow.  He is taking medications as instructed, no side effects reported.  He denies severe headache, visual changes, exertional chest pain, palpitations, abdominal pain, nausea,vomiting, or focal weakness.  Hx of PAD, on Plavix 75 mg daily. + Smoker.   Concerns today:   "Sinusitis" She states that he usually gets these symptoms one to twice per year. History of COPD. He is currently on Combivent Respimat twice daily as needed.  He also has nebulizer at home for Duoneb, used it last night. According to patient, he doesn't have cough or respiratory symptoms very frequent as far as he is not sick with acute respiratory infection.  productive cough with clear/white sputum, he denies hemoptysis.  Denies fever, chill, or myalgias.  Moderate nasal congestion, rhinorrhea, sore throat, and post nasal drainage. Pressure frontal and bitemporal headache, mild. + Exertional dyspnea and wheezing.   No Hx of recent travel. Sick contact, his wife has had similar symptoms. No known insect bite. No Hx of allergies.  OTC medications for this problem: OTC sinus medications. Symptoms otherwise stable.   Review of Systems  Constitutional: Positive for activity change  and fatigue. Negative for appetite change, chills and fever.  HENT: Positive for congestion, postnasal drip, rhinorrhea, sinus pressure and sore throat. Negative for ear pain, mouth sores, sneezing, trouble swallowing and voice change.   Eyes: Negative for discharge, redness, itching and visual disturbance.  Respiratory: Positive for cough, shortness of breath and wheezing.   Cardiovascular: Negative for chest pain, palpitations and leg swelling.  Gastrointestinal: Negative for abdominal pain, diarrhea, nausea and vomiting.  Endocrine: Negative for cold intolerance, heat intolerance, polydipsia, polyphagia and polyuria.  Genitourinary: Negative for decreased urine volume, dysuria and hematuria.  Musculoskeletal: Negative for gait problem and myalgias.  Skin: Negative for pallor and rash.  Allergic/Immunologic: Negative for environmental allergies.  Neurological: Positive for headaches. Negative for syncope and weakness.  Hematological: Negative for adenopathy. Does not bruise/bleed easily.  Psychiatric/Behavioral: Negative for confusion. The patient is not nervous/anxious.       Current Outpatient Prescriptions on File Prior to Visit  Medication Sig Dispense Refill  . albuterol-ipratropium (COMBIVENT) 18-103 MCG/ACT inhaler Inhale 1 puff into the lungs every morning.    Marland Kitchen amLODipine (NORVASC) 10 MG tablet Take 10 mg by mouth daily.    . Cholecalciferol (VITAMIN D) 2000 UNITS CAPS Take 2,000 Units by mouth daily.    . clopidogrel (PLAVIX) 75 MG tablet Take 75 mg by mouth daily.      . furosemide (LASIX) 20 MG tablet Take 20 mg by mouth daily.     . insulin regular human CONCENTRATED (HUMULIN R) 500 UNIT/ML SOLN injection 50 units in the morning, 40 units at lunch, and 60 units at dinner.    . Multiple Vitamin (MULTIVITAMIN  WITH MINERALS) TABS tablet Take 1 tablet by mouth daily.    . Testosterone 10 MG/ACT (2%) GEL daily.  0  . valsartan (DIOVAN) 320 MG tablet Take 320 mg by mouth daily.      No current facility-administered medications on file prior to visit.      Past Medical History:  Diagnosis Date  . Arthritis   . Bronchitis    hx of  . COPD (chronic obstructive pulmonary disease) ([place])   . Diabetes mellitus   . DVT (deep venous thrombosis) ([place])   . GERD (gastroesophageal reflux disease)   . Hyperlipidemia   . Hypertension   . Hypothyroidism   . Onychomycosis 06/07/2013  . Stroke [facility])    TIA's 01/05/11-01/08/11-01/26/11  . Thyroid disease   . TIA (transient ischemic attack) 08/03/2011   Left sided weakness. 3 episodes Feb-March  . TIA (transient ischemic attack)    x 3   Allergies  Allergen Reactions  . Eszopiclone Itching  . Niaspan [Niacin Er] Nausea And Vomiting and Rash    Family History  Problem Relation Age of Onset  . Stroke Mother   . Diabetes Mother   . Other Father        alzheimers  . Diabetes Father   . Stroke Father   . Diabetes Sister     Social History   Social History  . Marital status: Married    Spouse name: N/A  . Number of children: N/A  . Years of education: N/A   Social History Main Topics  . Smoking status: Current Every Day Smoker    Packs/day: 0.50    Years: 5.00    Types: Cigarettes    Last attempt to quit: 08/02/2011  . Smokeless tobacco: Never Used  . Alcohol use 0.0 oz/week     Comment: rare  . Drug use: No  . Sexual activity: Not Asked   Other Topics Concern  . None   Social History Narrative  . None    Vitals:   09/04/17 0953  BP: (!) 160/80  Pulse: 77  Resp: 12  SpO2: 97%    Body mass index is 27.68 kg/m.  Physical Exam  Nursing note and vitals reviewed. Constitutional: He is oriented to person, place, and time. He appears well-developed. He does not appear ill. No distress.  HENT:  Head: Normocephalic and atraumatic.  Right Ear: External ear normal.  Left Ear: Tympanic membrane, external ear and ear canal normal.  Nose: Rhinorrhea present. Right sinus exhibits no maxillary sinus  tenderness and no frontal sinus tenderness. Left sinus exhibits no maxillary sinus tenderness and no frontal sinus tenderness.  Mouth/Throat: Oropharynx is clear and moist and mucous membranes are normal.  Excess cerumen right ear canal, not able to see TM. Postnasal drainage. Nasal voice.  Eyes: Pupils are equal, round, and reactive to light. Conjunctivae are normal.  Cardiovascular: Normal rate and regular rhythm.   No murmur heard. Pulses:      Posterior tibial pulses are 2+ on the right side, and 2+ on the left side.  Respiratory: Effort normal. No stridor. No respiratory distress. He has wheezes. He has rhonchi. He has no rales.  GI: Soft. He exhibits no mass. There is no tenderness.  Musculoskeletal: He exhibits no edema or tenderness.  Lymphadenopathy:       Head (right side): No submandibular adenopathy present.       Head (left side): No submandibular adenopathy present.    He has no cervical adenopathy.  Neurological: He  is alert and oriented to person, place, and time. He has normal strength. Gait normal.  Skin: Skin is warm. No rash noted. No erythema.  Psychiatric: He has a normal mood and affect.  Well groomed, good eye contact.    ASSESSMENT AND PLAN:   Sean Saunders was seen today for establish care.  Diagnoses and all orders for this visit:  COPD with acute exacerbation ([place])  Cough and wheezing improved after DuoNeb treatment, it was well tolerated. I don't appreciate rales.  Educated about diagnosis, explained this could be related to a viral illness but because he has been sick for 2 weeks and her risk of pneumonia antibiotic treatment was recommended. We discussed some side effects of Doxycycline. Further recommendations will be given according to CXR.  He will continue DuoNeb at home 3 times per day for 5-7 days then as needed. Instructed not to use Combivent while he is using DuoNeb. For now and not having LABA/ICS. He will start Prednisone, for now 5  days recommended but he might need it longer. Instructed about warning signs. Follow-up in 1-2 weeks.  -     ipratropium-albuterol (DUONEB) 0.5-2.5 (3) MG/3ML nebulizer solution 3 mL; Take 3 mLs by nebulization once. -     doxycycline (VIBRA-TABS) 100 MG tablet; Take 1 tablet (100 mg total) by mouth 2 (two) times daily. -     DG Chest 2 View; Future -     ipratropium-albuterol (DUONEB) 0.5-2.5 (3) MG/3ML SOLN; Take 3 mLs by nebulization every 8 (eight) hours. -     predniSONE (DELTASONE) 20 MG tablet; Take 2 tablets (40 mg total) by mouth daily with breakfast.  PVD (peripheral vascular disease) ([place])  Adverse effects of tobacco or discussed,strongly encouraged smoking cessation. Continue Crestor and Plavix. Continue following with Dr. Scot Dock.  Uncontrolled type 2 diabetes mellitus with diabetic peripheral angiopathy without gangrene, with long-term current use of insulin (HCC)  Some side effects of Prednisone were discussed. Recommend monitoring blood sugars closely. No changes in current management. He will continue following with endocrinologist through the [place].  Hypertension with heart disease  Not well controlled. Possible complications of elevated BP discussed. According to patient, antihypertensive medications were recently adjusted and he has an appointment tomorrow through the [place]. Low salt diet and smoking cessation recommended. Annual eye examination. Instructed about warning signs.  Colon cancer screening  He is due for colonoscopy, has last one through the [place] in 2006.He would like to have next one with [facility]. Referral placed.    Betty G. Martinique, MD  [facility]. [place] office.

## 2017-09-07 ENCOUNTER — Encounter: Payer: Self-pay | Admitting: Family Medicine

## 2017-09-10 DIAGNOSIS — J449 Chronic obstructive pulmonary disease, unspecified: Secondary | ICD-10-CM | POA: Insufficient documentation

## 2017-09-10 DIAGNOSIS — J440 Chronic obstructive pulmonary disease with acute lower respiratory infection: Secondary | ICD-10-CM | POA: Insufficient documentation

## 2017-09-10 NOTE — Progress Notes (Signed)
HPI:   [name] is a 71 y.o. male, who is here today to follow on recent OV.   He was seen on 09/04/17 for COPD exacerbation. Recommend Duoneb tid, Prednisone,and Doxycycline. He has tolerated medications well, no side effects reported.  + Smoker, has tried to decrease number of cig/day.  CXR done 09/04/17 negative for acute process.  Productive cough with clear sputum. No hemoptysis.  He has not had fever,chills, or muscle aches. + Fatigue.  Exertional dyspnea and wheezing have improved. Cough has also improved.  BS's were elevated while he was taking Prednisone, 200's. Now in the 120's.  He is still following with the [facility] for HTN. Medication were not adjusted last F/U , a week ago, he is reporting improvement. BP reading at home 140-150/80's. He is on Amlodipine 10 mg, HCTZ 25 mg,  And Valsartan 320 mg daily. Denies severe/frequent headache, visual changes, chest pain,palpitation, focal weakness, or edema.  Hx of PAD.   Review of Systems  Constitutional: Positive for fatigue. Negative for activity change, appetite change, chills and fever.  HENT: Positive for congestion, postnasal drip and rhinorrhea. Negative for mouth sores, sinus pain, sore throat, trouble swallowing and voice change.   Eyes: Negative for discharge and redness.  Respiratory: Positive for cough, shortness of breath and wheezing.   Cardiovascular: Negative for chest pain, palpitations and leg swelling.  Gastrointestinal: Negative for abdominal pain, diarrhea, nausea and vomiting.  Genitourinary: Negative for decreased urine volume, dysuria and hematuria.  Skin: Negative for pallor and rash.  Neurological: Negative for syncope, weakness and headaches.  Hematological: Negative for adenopathy. Does not bruise/bleed easily.  Psychiatric/Behavioral: Negative for confusion. The patient is not nervous/anxious.       Current Outpatient Prescriptions on File Prior to Visit  Medication Sig  Dispense Refill  . albuterol-ipratropium (COMBIVENT) 18-103 MCG/ACT inhaler Inhale 1 puff into the lungs every morning.    Marland Kitchen amLODipine (NORVASC) 10 MG tablet Take 10 mg by mouth daily.    . Cholecalciferol (VITAMIN D) 2000 UNITS CAPS Take 2,000 Units by mouth daily.    . clopidogrel (PLAVIX) 75 MG tablet Take 75 mg by mouth daily.      Marland Kitchen doxycycline (VIBRA-TABS) 100 MG tablet Take 1 tablet (100 mg total) by mouth 2 (two) times daily. 20 tablet 0  . empagliflozin (JARDIANCE) 10 MG TABS tablet Take 10 mg by mouth daily.    . furosemide (LASIX) 20 MG tablet Take 20 mg by mouth daily.     . hydrochlorothiazide (HYDRODIURIL) 25 MG tablet Take 25 mg by mouth 2 (two) times daily.    . insulin regular human CONCENTRATED (HUMULIN R) 500 UNIT/ML SOLN injection 50 units in the morning, 40 units at lunch, and 60 units at dinner.    . levothyroxine (SYNTHROID, LEVOTHROID) 150 MCG tablet Take 150 mcg by mouth daily before breakfast.    . metFORMIN (GLUCOPHAGE) 500 MG tablet Take 500 mg by mouth 2 (two) times daily with a meal.    . Multiple Vitamin (MULTIVITAMIN WITH MINERALS) TABS tablet Take 1 tablet by mouth daily.    . saxagliptin HCl (ONGLYZA) 5 MG TABS tablet Take 5 mg by mouth daily.    . sildenafil (REVATIO) 20 MG tablet Take 20 mg by mouth.    . Testosterone 10 MG/ACT (2%) GEL daily.  0  . valsartan (DIOVAN) 320 MG tablet Take 320 mg by mouth daily.     No current facility-administered medications on file prior to  visit.      Past Medical History:  Diagnosis Date  . Arthritis   . Bronchitis    hx of  . COPD (chronic obstructive pulmonary disease) ([place])   . Diabetes mellitus   . DVT (deep venous thrombosis) ([place])   . GERD (gastroesophageal reflux disease)   . Hyperlipidemia   . Hypertension   . Hypothyroidism   . Onychomycosis 06/07/2013  . Stroke [facility])    TIA's 01/05/11-01/08/11-01/26/11  . Thyroid disease   . TIA (transient ischemic attack) 08/03/2011   Left sided weakness. 3 episodes  Feb-March  . TIA (transient ischemic attack)    x 3   Allergies  Allergen Reactions  . Eszopiclone Itching  . Niaspan [Niacin Er] Nausea And Vomiting and Rash    Social History   Social History  . Marital status: Married    Spouse name: N/A  . Number of children: N/A  . Years of education: N/A   Social History Main Topics  . Smoking status: Current Every Day Smoker    Packs/day: 0.50    Years: 5.00    Types: Cigarettes    Last attempt to quit: 08/02/2011  . Smokeless tobacco: Never Used  . Alcohol use 0.0 oz/week     Comment: rare  . Drug use: No  . Sexual activity: Not Asked   Other Topics Concern  . None   Social History Narrative  . None    Vitals:   09/11/17 0946  BP: (!) 150/70  Pulse: 85  Resp: 16  Temp: 98.3 F (36.8 C)  SpO2: 96%   Body mass index is 28.07 kg/m.   Physical Exam  Nursing note and vitals reviewed. Constitutional: He is oriented to person, place, and time. He appears well-developed. He does not appear ill. No distress.  HENT:  Head: Normocephalic and atraumatic.  Nose: Rhinorrhea present. Right sinus exhibits no maxillary sinus tenderness and no frontal sinus tenderness. Left sinus exhibits no maxillary sinus tenderness and no frontal sinus tenderness.  Mouth/Throat: Oropharynx is clear and moist and mucous membranes are normal.  Post nasal drainage.   Eyes: Pupils are equal, round, and reactive to light. Conjunctivae are normal.  Cardiovascular: Normal rate and regular rhythm.   No murmur heard. Respiratory: Effort normal. No respiratory distress. He has no decreased breath sounds. He has wheezes. He has no rales.  Noises transmitted from upper airway and clear after coughing.  Musculoskeletal: He exhibits no edema or tenderness.  Lymphadenopathy:    He has no cervical adenopathy.  Neurological: He is alert and oriented to person, place, and time. He has normal strength. Gait normal.  Skin: Skin is warm. No rash noted. No  erythema.  Psychiatric: He has a normal mood and affect.  Fairly groomed, good eye contact.    ASSESSMENT AND PLAN:   Sean Saunders was seen today for follow-up.  Diagnoses and all orders for this visit:  COPD with acute exacerbation ([place])  Still symptomatic but clinically he has improved. Explained that I do not think longer abx treatment is needed at this time, complete 10 days. Educated about COPD physiopathology. Lung auscultation improved after Duoneb treatment, mild wheezing, no rales or rhonchi. Plain Mucinex OTC may help. Continue Duoneb neb tid for now. Also Prednisone taper started.  Instructed about warning signs.  -     predniSONE (DELTASONE) 20 MG tablet; 2 tabs for 5 days, 2 tabs for 3 days, 1 tabs for 3 days, and 1/2 tab for 3 days. Take tables  together with breakfast. -     ipratropium-albuterol (DUONEB) 0.5-2.5 (3) MG/3ML nebulizer solution 3 mL; Take 3 mLs by nebulization once. -     ipratropium-albuterol (DUONEB) 0.5-2.5 (3) MG/3ML SOLN; Take 3 mLs by nebulization every 8 (eight) hours.  Hypertension with heart disease  Numbers today are better. No changes in current management. He tells me that he has an appt in 3 weeks. Continue monitoring BP.  Tobacco use disorder  We discussed adverse effects of tobacco use. Encouraged to quit smoking.     Mariaha Ellington G. Martinique, MD  [facility]. [place] office.

## 2017-09-11 ENCOUNTER — Encounter: Payer: Self-pay | Admitting: Family Medicine

## 2017-09-11 ENCOUNTER — Ambulatory Visit (INDEPENDENT_AMBULATORY_CARE_PROVIDER_SITE_OTHER): Payer: Medicare Other | Admitting: Family Medicine

## 2017-09-11 VITALS — BP 150/70 | HR 85 | Temp 98.3°F | Resp 16 | Ht 72.0 in | Wt 207.0 lb

## 2017-09-11 DIAGNOSIS — I6523 Occlusion and stenosis of bilateral carotid arteries: Secondary | ICD-10-CM | POA: Diagnosis not present

## 2017-09-11 DIAGNOSIS — J441 Chronic obstructive pulmonary disease with (acute) exacerbation: Secondary | ICD-10-CM

## 2017-09-11 DIAGNOSIS — I119 Hypertensive heart disease without heart failure: Secondary | ICD-10-CM

## 2017-09-11 DIAGNOSIS — F17200 Nicotine dependence, unspecified, uncomplicated: Secondary | ICD-10-CM

## 2017-09-11 MED ORDER — IPRATROPIUM-ALBUTEROL 0.5-2.5 (3) MG/3ML IN SOLN
3.0000 mL | Freq: Once | RESPIRATORY_TRACT | Status: AC
  Administered 2017-09-11: 3 mL via RESPIRATORY_TRACT

## 2017-09-11 MED ORDER — IPRATROPIUM-ALBUTEROL 0.5-2.5 (3) MG/3ML IN SOLN: 3.0000 mL | Freq: Three times a day (TID) | RESPIRATORY_TRACT | 0 refills | Status: DC

## 2017-09-11 MED ORDER — PREDNISONE 20 MG PO TABS: ORAL_TABLET | ORAL | 0 refills | Status: AC

## 2017-09-11 NOTE — Patient Instructions (Addendum)
A few things to remember from today's visit:   Hypertension with heart disease  Chronic obstructive pulmonary disease with acute exacerbation ([place]) - Plan: predniSONE (DELTASONE) 20 MG tablet, ipratropium-albuterol (DUONEB) 0.5-2.5 (3) MG/3ML nebulizer solution 3 mL  COPD with acute exacerbation ([place]) - Plan: ipratropium-albuterol (DUONEB) 0.5-2.5 (3) MG/3ML SOLN  Completed antibiotic treatment. Plain Mucinex over-the-counter might help with coughing up mucus. Monitor for fever, worsening shortness of breath, or bloody sputum.  Continue  Duoneb for now  Over-the-counter Allegra 180 mg daily might help with postnasal   Please be sure medication list is accurate. If a new problem present, please set up appointment sooner than planned today.

## 2017-09-13 ENCOUNTER — Ambulatory Visit (INDEPENDENT_AMBULATORY_CARE_PROVIDER_SITE_OTHER): Payer: Medicare Other | Admitting: Podiatry

## 2017-09-13 ENCOUNTER — Encounter: Payer: Self-pay | Admitting: Podiatry

## 2017-09-13 DIAGNOSIS — B351 Tinea unguium: Secondary | ICD-10-CM

## 2017-09-13 DIAGNOSIS — M79672 Pain in left foot: Secondary | ICD-10-CM | POA: Diagnosis not present

## 2017-09-13 DIAGNOSIS — M79671 Pain in right foot: Secondary | ICD-10-CM

## 2017-09-13 NOTE — Patient Instructions (Signed)
Seen for hypertrophic nails and thick calluses. All nails debrided. Both heel calluses debrided. Return in 3 months or as needed.

## 2017-09-13 NOTE — Progress Notes (Signed)
Subjective:  71 year old male presents requesting toe nails and calluses trimmed. Blood sugar is going up and down due to steroid intake for sinus problem.   Objective: Nails are hypertrophic.  Thick keratotic tissue build up under 5th MPJ bilateral, plantar heel bilateral, painful. Pedal pulses are not palpable. High arch foot without any gross deformities.   Assessment: Mycotic nails x 10.  PVD left lower limb.  Painful feet with thick nails and heel calluses. Controlled IDDM.  Plan: Debrided all nails. Counseling given on cigarette.  Return in 3 months or as needed.

## 2017-09-14 DIAGNOSIS — D1801 Hemangioma of skin and subcutaneous tissue: Secondary | ICD-10-CM | POA: Diagnosis not present

## 2017-09-14 DIAGNOSIS — D2271 Melanocytic nevi of right lower limb, including hip: Secondary | ICD-10-CM | POA: Diagnosis not present

## 2017-09-14 DIAGNOSIS — L570 Actinic keratosis: Secondary | ICD-10-CM | POA: Diagnosis not present

## 2017-09-14 DIAGNOSIS — D225 Melanocytic nevi of trunk: Secondary | ICD-10-CM | POA: Diagnosis not present

## 2017-09-14 DIAGNOSIS — L814 Other melanin hyperpigmentation: Secondary | ICD-10-CM | POA: Diagnosis not present

## 2017-09-14 DIAGNOSIS — D485 Neoplasm of uncertain behavior of skin: Secondary | ICD-10-CM | POA: Diagnosis not present

## 2017-09-14 DIAGNOSIS — D2272 Melanocytic nevi of left lower limb, including hip: Secondary | ICD-10-CM | POA: Diagnosis not present

## 2017-09-14 DIAGNOSIS — Z85828 Personal history of other malignant neoplasm of skin: Secondary | ICD-10-CM | POA: Diagnosis not present

## 2017-09-15 DIAGNOSIS — H903 Sensorineural hearing loss, bilateral: Secondary | ICD-10-CM | POA: Diagnosis not present

## 2017-09-15 DIAGNOSIS — D333 Benign neoplasm of cranial nerves: Secondary | ICD-10-CM | POA: Diagnosis not present

## 2017-09-15 DIAGNOSIS — H6121 Impacted cerumen, right ear: Secondary | ICD-10-CM | POA: Diagnosis not present

## 2017-09-15 DIAGNOSIS — H838X3 Other specified diseases of inner ear, bilateral: Secondary | ICD-10-CM | POA: Diagnosis not present

## 2017-09-24 NOTE — Progress Notes (Signed)
HPI:   [name] is a 71 y.o. male, who is here today to follow on recent OV.   He was seen on 09/11/17 for COPD exacerbation follow up. He was improved but still having productive cough a wheezing.  + Smoker, has not smoked in a few days. He is using "vapor" with nicotine.   He completed abx treatment. Last OV started on Prednisone taper and continue Duoneb. He took his last dose of Prednisone was yesterday.   CXR: 09/04/17 negative.  Overall he is feeling better, no new symptoms reported.  He is still coughing and having occasional wheezing but symptoms have improved greatly. He is now complaining about clear rhinorrhea and postnasal drainage as well as sinus pressure. He denies facial pain, fevers, chills, dyspnea.  His PCP at the [facility] sent a prescription for Flonase nasal spray, he is going to pick it up today. He is not using any OTC antihistaminic.   History of taking plain Mucinex daily as needed.  He still taking DuoNeb once daily, in the morning. He has Combivent, which he uses once daily.  HTN: BP readings at home: better, "a little higher"  He follows with the [place]. Currently he is on Amlodipine 10 mg, Diovan 320 mg daily. Denies severe/frequent headache, visual changes, chest pain,palpitation, focal weakness, or edema.  Hx of PAD.  He follows with endocrinologist at the [facility] for DM II. BS's "a little high" while he has been on Prednisone. Denies abdominal pain, nausea,vomiting, polydipsia,polyuria, or polyphagia.   Appt next month with GI for colonoscopy.    Review of Systems  Constitutional: Negative for appetite change, chills, fatigue and fever.  HENT: Positive for congestion, postnasal drip and rhinorrhea. Negative for ear discharge, ear pain, nosebleeds and sore throat.   Respiratory: Positive for cough and wheezing. Negative for shortness of breath.   Cardiovascular: Negative for chest pain and leg swelling.  Gastrointestinal: Negative  for abdominal pain, diarrhea, nausea and vomiting.  Endocrine: Negative for polydipsia, polyphagia and polyuria.  Genitourinary: Negative for decreased urine volume and hematuria.  Musculoskeletal: Negative for gait problem and myalgias.  Allergic/Immunologic: Positive for environmental allergies.  Neurological: Negative for syncope, weakness and headaches.  Hematological: Does not bruise/bleed easily.      Current Outpatient Prescriptions on File Prior to Visit  Medication Sig Dispense Refill  . albuterol-ipratropium (COMBIVENT) 18-103 MCG/ACT inhaler Inhale 1 puff into the lungs every morning.    Marland Kitchen amLODipine (NORVASC) 10 MG tablet Take 10 mg by mouth daily.    . Cholecalciferol (VITAMIN D) 2000 UNITS CAPS Take 2,000 Units by mouth daily.    . clopidogrel (PLAVIX) 75 MG tablet Take 75 mg by mouth daily.      . empagliflozin (JARDIANCE) 10 MG TABS tablet Take 10 mg by mouth daily.    . furosemide (LASIX) 20 MG tablet Take 20 mg by mouth daily.     . hydrochlorothiazide (HYDRODIURIL) 25 MG tablet Take 25 mg by mouth 2 (two) times daily.    . insulin regular human CONCENTRATED (HUMULIN R) 500 UNIT/ML SOLN injection 50 units in the morning, 40 units at lunch, and 60 units at dinner.    . levothyroxine (SYNTHROID, LEVOTHROID) 150 MCG tablet Take 150 mcg by mouth daily before breakfast.    . metFORMIN (GLUCOPHAGE) 500 MG tablet Take 500 mg by mouth 2 (two) times daily with a meal.    . Multiple Vitamin (MULTIVITAMIN WITH MINERALS) TABS tablet Take 1 tablet by mouth  daily.    . saxagliptin HCl (ONGLYZA) 5 MG TABS tablet Take 5 mg by mouth daily.    . sildenafil (REVATIO) 20 MG tablet Take 20 mg by mouth.    . Testosterone 10 MG/ACT (2%) GEL daily.  0  . valsartan (DIOVAN) 320 MG tablet Take 320 mg by mouth daily.    Marland Kitchen ipratropium-albuterol (DUONEB) 0.5-2.5 (3) MG/3ML SOLN Take 3 mLs by nebulization every 8 (eight) hours. 60 mL 0   No current facility-administered medications on file prior to  visit.      Past Medical History:  Diagnosis Date  . Arthritis   . Bronchitis    hx of  . COPD (chronic obstructive pulmonary disease) ([place])   . Diabetes mellitus   . DVT (deep venous thrombosis) ([place])   . GERD (gastroesophageal reflux disease)   . Hyperlipidemia   . Hypertension   . Hypothyroidism   . Onychomycosis 06/07/2013  . Stroke [facility])    TIA's 01/05/11-01/08/11-01/26/11  . Thyroid disease   . TIA (transient ischemic attack) 08/03/2011   Left sided weakness. 3 episodes Feb-March  . TIA (transient ischemic attack)    x 3   Allergies  Allergen Reactions  . Eszopiclone Itching  . Niaspan [Niacin Er] Nausea And Vomiting and Rash    Social History   Social History  . Marital status: Married    Spouse name: N/A  . Number of children: N/A  . Years of education: N/A   Social History Main Topics  . Smoking status: Current Every Day Smoker    Packs/day: 0.50    Years: 5.00    Types: Cigarettes    Last attempt to quit: 08/02/2011  . Smokeless tobacco: Never Used  . Alcohol use 0.0 oz/week     Comment: rare  . Drug use: No  . Sexual activity: Not Asked   Other Topics Concern  . None   Social History Narrative  . None    Vitals:   09/25/17 0927  BP: 138/70  Pulse: 85  Resp: 12  SpO2: 97%   Body mass index is 28.62 kg/m.   Physical Exam  Nursing note and vitals reviewed. Constitutional: He is oriented to person, place, and time. He appears well-developed. He does not appear ill. No distress.  HENT:  Head: Normocephalic and atraumatic.  Nose: Rhinorrhea present. Right sinus exhibits no maxillary sinus tenderness and no frontal sinus tenderness. Left sinus exhibits no maxillary sinus tenderness and no frontal sinus tenderness.  Mouth/Throat: Oropharynx is clear and moist and mucous membranes are normal.  Postnasal drainage.  Eyes: Conjunctivae are normal.  Cardiovascular: Normal rate and regular rhythm.   No murmur heard. Respiratory: Effort normal and  breath sounds normal. No respiratory distress. He has no wheezes. He has no rhonchi. He has no rales.  Prolonged expiration.  Musculoskeletal: He exhibits no edema or tenderness.  Lymphadenopathy:    He has no cervical adenopathy.  Neurological: He is alert and oriented to person, place, and time. He has normal strength. Gait normal.  Skin: Skin is warm. No rash noted. No erythema.  Psychiatric: He has a normal mood and affect. His speech is normal.  Well groomed, good eye contact.     ASSESSMENT AND PLAN:  Mr. Priest was seen today for follow-up.  Diagnoses and all orders for this visit:  Chronic obstructive pulmonary disease with acute exacerbation ([place])  Acute exacerbation resolved. Encouraged to continue tobacco free. He agrees with starting Symbicort bid, he will let me know  if his health insurance covers a different one. Continue Combivent bid and he can discontinue Duoneb. Instructed about warning signs. F/U in 4 months.  -     budesonide-formoterol (SYMBICORT) 80-4.5 MCG/ACT inhaler; Inhale 2 puffs into the lungs 2 (two) times daily.  Hypertension with heart disease  Improved. No changes in current management. Eye exam at least once per year (also Hx of DM II). Low salt DASH diet to continue. Continue following at the [place].  Allergic rhinitis, unspecified seasonality, unspecified trigger  OTC antihistaminic recommended. Nasal irrigations with saline as needed. F/U as needed.    Miyoko Hashimi G. Martinique, MD  [facility]. [place] office.

## 2017-09-25 ENCOUNTER — Encounter: Payer: Self-pay | Admitting: Family Medicine

## 2017-09-25 ENCOUNTER — Ambulatory Visit (INDEPENDENT_AMBULATORY_CARE_PROVIDER_SITE_OTHER): Payer: Medicare Other | Admitting: Family Medicine

## 2017-09-25 VITALS — BP 138/70 | HR 85 | Resp 12 | Ht 72.0 in | Wt 211.0 lb

## 2017-09-25 DIAGNOSIS — J309 Allergic rhinitis, unspecified: Secondary | ICD-10-CM | POA: Diagnosis not present

## 2017-09-25 DIAGNOSIS — J441 Chronic obstructive pulmonary disease with (acute) exacerbation: Secondary | ICD-10-CM

## 2017-09-25 DIAGNOSIS — I6523 Occlusion and stenosis of bilateral carotid arteries: Secondary | ICD-10-CM | POA: Diagnosis not present

## 2017-09-25 DIAGNOSIS — Z23 Encounter for immunization: Secondary | ICD-10-CM | POA: Diagnosis not present

## 2017-09-25 DIAGNOSIS — I119 Hypertensive heart disease without heart failure: Secondary | ICD-10-CM | POA: Diagnosis not present

## 2017-09-25 MED ORDER — BUDESONIDE-FORMOTEROL FUMARATE 80-4.5 MCG/ACT IN AERO: 2.0000 | INHALATION_SPRAY | Freq: Two times a day (BID) | RESPIRATORY_TRACT | 3 refills | Status: DC

## 2017-09-25 NOTE — Patient Instructions (Signed)
A few things to remember from today's visit:   Chronic obstructive pulmonary disease with acute exacerbation ([place]) - Plan: budesonide-formoterol (SYMBICORT) 80-4.5 MCG/ACT inhaler  Hypertension with heart disease  Allergic rhinitis, unspecified seasonality, unspecified trigger  There are 2 forms of allergic rhinitis: . Seasonal (hay fever): Caused by an allergy to pollen and/or mold spores in the air. Pollen is the fine powder that comes from the stamen of flowering plants. It can be carried through the air and is easily inhaled. Symptoms are seasonal and usually occur in spring, late summer, and fall. Marland Kitchen Perennial: Caused by other allergens such as dust mites, pet hair or dander, or mold. Symptoms occur year-round.  Symptoms: Your symptoms can vary, depending on the severity of your allergies. Symptoms can include: Sneezing, coughing.itching (mostly eyes, nose, mouth, throat and skin),runny nose,stuffy nose.headache,pressure in the nose and cheeks,ear fullness and popping, sore throat.watery, red, or swollen eyes,dark circles under your eyes,trouble smelling, and sometimes hives.  Allergic rhinitis cannot be prevented. You can help your symptoms by avoiding the things that you are allergic, including: . Keeping windows closed. This is especially important during high-pollen seasons. . Washing your hands after petting animals. . Using dust- and mite-proof bedding and mattress covers. . Wearing glasses outside to protect your eyes. . Showering before bed to wash off allergens from hair and skin. You can also avoid things that can make your symptoms worse, such as: . aerosol sprays . air pollution . cold temperatures . humidity . irritating fumes . tobacco smoke . wind . wood smoke.   Antihistamines help reduce the sneezing, runny nose, and itchiness of allergies. These come in pill form and as nasal sprays. Allegra,Zyrtec,or Claritin are some examples. Decongestants, such as  pseudoephedrine and phenylephrine, help temporarily relieve the stuffy nose of allergies. Decongestants are found in many medicines and come as pills, nose sprays, and nose drops. They could increase heart rate and cause tachycardia and tremor. Nasal Afrin should not be used for more than 3 days because you can become dependent on them. This causes you to feel even more stopped-up when you try to quit using them.  Nasal sprays: steroids or antihistaminics. Over the counter intranasal sterids: Nasocort,Rhinocort,or Flonase.You won't notice their benefits for up to 2 weeks after starting them. Allergy shots or sublingual tablets when other treatment do not help.This is done by immunologists.   Please be sure medication list is accurate. If a new problem present, please set up appointment sooner than planned today.

## 2017-09-29 ENCOUNTER — Telehealth: Payer: Self-pay | Admitting: Family Medicine

## 2017-09-29 MED ORDER — FLUTICASONE-SALMETEROL 250-50 MCG/DOSE IN AEPB: 1.0000 | INHALATION_SPRAY | Freq: Two times a day (BID) | RESPIRATORY_TRACT | 3 refills | Status: DC

## 2017-09-29 NOTE — Telephone Encounter (Signed)
It is fine to start Advair instead, 250-50 mcg bid. Thanks, BJ

## 2017-09-29 NOTE — Telephone Encounter (Signed)
Rx sent 

## 2017-09-29 NOTE — Telephone Encounter (Signed)
° ° ° °  Pt insurance company is asking for a prior auth. So pharmacy told pt his insurance will pay for [place]

## 2017-09-29 NOTE — Telephone Encounter (Signed)
Okay to switch to Advair?

## 2017-10-10 ENCOUNTER — Ambulatory Visit (INDEPENDENT_AMBULATORY_CARE_PROVIDER_SITE_OTHER): Payer: Medicare Other | Admitting: Physician Assistant

## 2017-10-10 ENCOUNTER — Encounter: Payer: Self-pay | Admitting: Physician Assistant

## 2017-10-10 VITALS — BP 160/84 | HR 78 | Ht 72.0 in | Wt 213.0 lb

## 2017-10-10 DIAGNOSIS — I739 Peripheral vascular disease, unspecified: Secondary | ICD-10-CM

## 2017-10-10 DIAGNOSIS — Z1211 Encounter for screening for malignant neoplasm of colon: Secondary | ICD-10-CM | POA: Diagnosis not present

## 2017-10-10 DIAGNOSIS — Z7901 Long term (current) use of anticoagulants: Secondary | ICD-10-CM

## 2017-10-10 DIAGNOSIS — Z1212 Encounter for screening for malignant neoplasm of rectum: Secondary | ICD-10-CM | POA: Diagnosis not present

## 2017-10-10 MED ORDER — NA SULFATE-K SULFATE-MG SULF 17.5-3.13-1.6 GM/177ML PO SOLN: 1.0000 | ORAL | 0 refills | Status: DC

## 2017-10-10 NOTE — Progress Notes (Signed)
Chief Complaint: consult for screening colonoscopy have patient on chronic anticoagulation  HPI:  Sean Saunders is a 71 year old male with a past medical history as listed below including COPD and stroke, maintain on Plavix for PAD s/p stent placement,  who was referred to me by Martinique, Betty G, MD for a consultation of a screening colonoscopy on chronic anticoagulation .      For review chart patient has been following with primary care regarding exacerbation of his COPD.he was last seen 09/25/17 after finishing a prednisone taper and was feeling some better with continued coughing and occasional wheezing.  Patient was started on Symbicort.    Today, the patient explains that he feels much improved after his recent COPD exacerbation. He tells me he has been on Plavix forever after multiple stent placements for PAD, the last in 2013. He denies any current shortness of breath above his baseline, chest pain or GI complaints.   Patient denies fever, chills, blood in the stool, melena, change in bowel habits, nausea, vomiting, heartburn, reflux or abdominal pain.  Past Medical History:  Diagnosis Date  . Arthritis   . Bronchitis    hx of  . COPD (chronic obstructive pulmonary disease) ([place])   . Diabetes mellitus   . DVT (deep venous thrombosis) ([place])   . GERD (gastroesophageal reflux disease)   . Hyperlipidemia   . Hypertension   . Hypothyroidism   . Onychomycosis 06/07/2013  . Stroke [facility])    TIA's 01/05/11-01/08/11-01/26/11  . Thyroid disease   . TIA (transient ischemic attack) 08/03/2011   Left sided weakness. 3 episodes Feb-March  . TIA (transient ischemic attack)    x 3    Past Surgical History:  Procedure Laterality Date  . ABDOMINAL AORTAGRAM N/A 06/04/2012   Procedure: ABDOMINAL Maxcine Ham;  Surgeon: Angelia Mould, MD;  Location: [facility] CATH LAB;  Service: Cardiovascular;  Laterality: N/A;  . APPENDECTOMY    . COLONOSCOPY    . FEMORAL-POPLITEAL BYPASS GRAFT  07/05/2012   Procedure: BYPASS GRAFT FEMORAL-POPLITEAL ARTERY;  Surgeon: Angelia Mould, MD;  Location: [place] OR;  Service: Vascular;  Laterality: Left;  Revision Left fem/pop w/ artery patch angioplasty utilizing left GSV graft and extenion to tib/per trunk w/right GSV and interop arteriogram  . JOINT REPLACEMENT  2011   -Hip  . NASAL SINUS SURGERY    . PR VEIN BYPASS GRAFT,AORTO-FEM-POP    . PR VEIN BYPASS GRAFT,AORTO-FEM-POP  08-01-11   (L) Fem-pop BP  . toe nail    . TONSILLECTOMY      Current Outpatient Prescriptions  Medication Sig Dispense Refill  . albuterol-ipratropium (COMBIVENT) 18-103 MCG/ACT inhaler Inhale 1 puff into the lungs every morning.    Marland Kitchen amLODipine (NORVASC) 10 MG tablet Take 10 mg by mouth daily.    . budesonide-formoterol (SYMBICORT) 80-4.5 MCG/ACT inhaler Inhale 2 puffs into the lungs 2 (two) times daily. 1 Inhaler 3  . Cholecalciferol (VITAMIN D) 2000 UNITS CAPS Take 2,000 Units by mouth daily.    . clopidogrel (PLAVIX) 75 MG tablet Take 75 mg by mouth daily.      . empagliflozin (JARDIANCE) 10 MG TABS tablet Take 10 mg by mouth daily.    . Fluticasone-Salmeterol (ADVAIR DISKUS) 250-50 MCG/DOSE AEPB Inhale 1 puff into the lungs 2 (two) times daily. 60 each 3  . furosemide (LASIX) 20 MG tablet Take 20 mg by mouth daily.     . hydrochlorothiazide (HYDRODIURIL) 25 MG tablet Take 25 mg by mouth 2 (two) times daily.    Marland Kitchen  insulin regular human CONCENTRATED (HUMULIN R) 500 UNIT/ML SOLN injection 50 units in the morning, 40 units at lunch, and 60 units at dinner.    Marland Kitchen ipratropium-albuterol (DUONEB) 0.5-2.5 (3) MG/3ML SOLN Take 3 mLs by nebulization every 8 (eight) hours. 60 mL 0  . levothyroxine (SYNTHROID, LEVOTHROID) 150 MCG tablet Take 150 mcg by mouth daily before breakfast.    . metFORMIN (GLUCOPHAGE) 500 MG tablet Take 500 mg by mouth 2 (two) times daily with a meal.    . Multiple Vitamin (MULTIVITAMIN WITH MINERALS) TABS tablet Take 1 tablet by mouth daily.    . saxagliptin  HCl (ONGLYZA) 5 MG TABS tablet Take 5 mg by mouth daily.    . sildenafil (REVATIO) 20 MG tablet Take 20 mg by mouth.    . Testosterone 10 MG/ACT (2%) GEL daily.  0  . valsartan (DIOVAN) 320 MG tablet Take 320 mg by mouth daily.     No current facility-administered medications for this visit.     Allergies as of 10/10/2017 - Review Complete 09/25/2017  Allergen Reaction Noted  . Eszopiclone Itching 08/02/2011  . Niaspan [niacin er] Nausea And Vomiting and Rash 08/02/2011    Family History  Problem Relation Age of Onset  . Stroke Mother   . Diabetes Mother   . Other Father        alzheimers  . Diabetes Father   . Stroke Father   . Diabetes Sister     Social History   Social History  . Marital status: Married    Spouse name: N/A  . Number of children: N/A  . Years of education: N/A   Occupational History  . Not on file.   Social History Main Topics  . Smoking status: Current Every Day Smoker    Packs/day: 0.50    Years: 5.00    Types: Cigarettes    Last attempt to quit: 08/02/2011  . Smokeless tobacco: Never Used  . Alcohol use 0.0 oz/week     Comment: rare  . Drug use: No  . Sexual activity: Not on file   Other Topics Concern  . Not on file   Social History Narrative  . No narrative on file    Review of Systems:    Constitutional: No weight loss, fever or chills Skin: No rash  Cardiovascular: No chest pain   Respiratory: Chronic SOB Gastrointestinal: See HPI and otherwise negative Genitourinary: No dysuria or change in urinary frequency Neurological: No headache Musculoskeletal: No new muscle or joint pain Hematologic: No bleeding Psychiatric: No history of depression or anxiety   Physical Exam:  Vital signs: BP (!) 160/84   Pulse 78   Ht 6' (1.829 m)   Wt 213 lb (96.6 kg)   SpO2 94%   BMI 28.89 kg/m    Constitutional:   Pleasant chronically ill appearing Caucasian male appears to be in NAD, Well developed, Well nourished, alert and  cooperative, nicotine odor Head:  Normocephalic and atraumatic. Eyes:   PEERL, EOMI. No icterus. Conjunctiva pink. Ears:  Normal auditory acuity. Neck:  Supple Throat: Oral cavity and pharynx without inflammation, swelling or lesion.  Respiratory: Respirations even and unlabored. B/l wheezes in lung bases Cardiovascular: Normal S1, S2. No MRG. Regular rate and rhythm. No peripheral edema, cyanosis or pallor.  Gastrointestinal:  Soft, nondistended, nontender. No rebound or guarding. Normal bowel sounds. No appreciable masses or hepatomegaly. Rectal:  Not performed.  Msk:  Symmetrical without gross deformities. Without edema, no deformity or joint abnormality.  Neurologic:  Alert and  oriented x4;  grossly normal neurologically.  Skin:   Dry and intact without significant lesions or rashes. Red tone to face and skin Psychiatric: Demonstrates good judgement and reason without abnormal affect or behaviors.  No recent labs or imaging.  Assessment: 1. Screening for colorectal cancer: last in 2006, unsure where, normal per pt, repeat due now 2. Chronic anticoagulation: with Plavix for PAD s/p stent placement, last in 2013 3. PAD  Plan: 1. Patient was advised to hold his Plavix for 5 days prior to time of his colonoscopy. We will communicate with his vascular specialist Dr. Scot Dock to ensure that holding his Plavix is acceptable for him. 2. Patient is already scheduled for a screening colonoscopy on November 19 with Dr. Havery Moros in the [facility]. Did review risks, benefits, limitations and alternatives and the patient agrees to proceed. 3. Patient to return to clinic per recommendations from Dr. Havery Moros after time procedure.  Ellouise Newer, PA-C [facility] 10/10/2017, 2:42 PM  Cc: Martinique, Betty G, MD

## 2017-10-10 NOTE — Patient Instructions (Addendum)
You have been scheduled for a colonoscopy. Please follow written instructions given to you at your visit today.  Please pick up your prep supplies at the pharmacy within the next 1-3 days. If you use inhalers (even only as needed), please bring them with you on the day of your procedure. Your physician has requested that you go to www.startemmi.com and enter the access code given to you at your visit today. This web site gives a general overview about your procedure. However, you should still follow specific instructions given to you by our office regarding your preparation for the procedure.  You will be contacted by our office prior to your procedure for directions on holding your Plavix.  If you do not hear from our office 1 week prior to your scheduled procedure, please call [phone] to discuss.  

## 2017-10-11 NOTE — Progress Notes (Signed)
Agree with assessment and plan. Hopefully patient is cleared to hold his plavix 5 days prior to the procedure. He can take an aspirin while holding plavix in regards to his PVD / stents.

## 2017-10-30 ENCOUNTER — Encounter: Payer: Self-pay | Admitting: Gastroenterology

## 2017-10-30 ENCOUNTER — Ambulatory Visit (AMBULATORY_SURGERY_CENTER): Payer: Medicare Other | Admitting: Gastroenterology

## 2017-10-30 ENCOUNTER — Other Ambulatory Visit: Payer: Self-pay

## 2017-10-30 VITALS — BP 138/73 | HR 80 | Temp 96.9°F | Resp 12 | Ht 72.0 in | Wt 213.0 lb

## 2017-10-30 DIAGNOSIS — D123 Benign neoplasm of transverse colon: Secondary | ICD-10-CM | POA: Diagnosis not present

## 2017-10-30 DIAGNOSIS — I10 Essential (primary) hypertension: Secondary | ICD-10-CM | POA: Diagnosis not present

## 2017-10-30 DIAGNOSIS — E119 Type 2 diabetes mellitus without complications: Secondary | ICD-10-CM | POA: Diagnosis not present

## 2017-10-30 DIAGNOSIS — Z1211 Encounter for screening for malignant neoplasm of colon: Secondary | ICD-10-CM | POA: Diagnosis not present

## 2017-10-30 DIAGNOSIS — J449 Chronic obstructive pulmonary disease, unspecified: Secondary | ICD-10-CM | POA: Diagnosis not present

## 2017-10-30 MED ORDER — SODIUM CHLORIDE 0.9 % IV SOLN: 500.0000 mL | INTRAVENOUS | Status: DC

## 2017-10-30 NOTE — Op Note (Signed)
[place] Patient Name: Sean Saunders Procedure Date: 10/30/2017 8:14 AM MRN: [id number] Endoscopist: Remo Lipps P. Armbruster MD, MD Age: 71 Referring MD:  Date of Birth: 15-Jul-1946 Gender: Male Account #: [id number] Procedure:                Colonoscopy Indications:              Screening for colorectal malignant neoplasm Medicines:                Monitored Anesthesia Care Procedure:                Pre-Anesthesia Assessment:                           - Prior to the procedure, a History and Physical                            was performed, and patient medications and                            allergies were reviewed. The patient's tolerance of                            previous anesthesia was also reviewed. The risks                            and benefits of the procedure and the sedation                            options and risks were discussed with the patient.                            All questions were answered, and informed consent                            was obtained. Prior Anticoagulants: The patient has                            taken Plavix (clopidogrel), last dose was 5 days                            prior to procedure. ASA Grade Assessment: III - A                            patient with severe systemic disease. After                            reviewing the risks and benefits, the patient was                            deemed in satisfactory condition to undergo the                            procedure.  After obtaining informed consent, the colonoscope                            was passed under direct vision. Throughout the                            procedure, the patient's blood pressure, pulse, and                            oxygen saturations were monitored continuously. The                            Model CF-HQ190L [id number]) scope was introduced                            through the anus and advanced to the the  cecum,                            identified by appendiceal orifice and ileocecal                            valve. The colonoscopy was performed without                            difficulty. The patient tolerated the procedure                            well. The quality of the bowel preparation was                            good. The ileocecal valve, appendiceal orifice, and                            rectum were photographed. Scope In: 8:28:51 AM Scope Out: 8:47:52 AM Scope Withdrawal Time: 0 hours 16 minutes 43 seconds  Total Procedure Duration: 0 hours 19 minutes 1 second  Findings:                 The perianal and digital rectal examinations were                            normal.                           Two sessile polyps were found in the transverse                            colon. The polyps were 4 to 5 mm in size. These                            polyps were removed with a cold snare. Resection                            and retrieval were complete.  Scattered medium-mouthed diverticula were found in                            the entire colon.                           Internal hemorrhoids were found during retroflexion.                           The exam was otherwise without abnormality. Complications:            No immediate complications. Estimated blood loss:                            Minimal. Estimated Blood Loss:     Estimated blood loss was minimal. Impression:               - Two 4 to 5 mm polyps in the transverse colon,                            removed with a cold snare. Resected and retrieved.                           - Diverticulosis in the entire examined colon.                           - Internal hemorrhoids.                           - The examination was otherwise normal. Recommendation:           - Patient has a contact number available for                            emergencies. The signs and symptoms of potential                             delayed complications were discussed with the                            patient. Return to normal activities tomorrow.                            Written discharge instructions were provided to the                            patient.                           - Resume previous diet.                           - Continue present medications.                           - Resume Plavix in 2 days                           -  Await pathology results.                           - Repeat colonoscopy is recommended for                            surveillance. The colonoscopy date will be                            determined after pathology results from today's                            exam become available for review.                           - No ibuprofen, naproxen, or other non-steroidal                            anti-inflammatory drugs for 2 weeks after polyp                            removal. Remo Lipps P. Armbruster MD, MD 10/30/2017 8:51:22 AM This report has been signed electronically.

## 2017-10-30 NOTE — Progress Notes (Signed)
Called to room to assist during endoscopic procedure.  Patient ID and intended procedure confirmed with present staff. Received instructions for my participation in the procedure from the performing physician.  

## 2017-10-30 NOTE — Progress Notes (Signed)
Pt is passing flatus.  His abdomen is soft.  Pt c/o abdominal cramping.  2 levins given sublinqual.  Pt up to the rest room to pass more flatus. mae

## 2017-10-30 NOTE — Progress Notes (Signed)
To recovery, report to RN, VSS. 

## 2017-10-30 NOTE — Patient Instructions (Signed)
YOU HAD AN ENDOSCOPIC PROCEDURE TODAY AT [facility]:   Refer to the procedure report that was given to you for any specific questions about what was found during the examination.  If the procedure report does not answer your questions, please call your gastroenterologist to clarify.  If you requested that your care partner not be given the details of your procedure findings, then the procedure report has been included in a sealed envelope for you to review at your convenience later.  YOU SHOULD EXPECT: Some feelings of bloating in the abdomen. Passage of more gas than usual.  Walking can help get rid of the air that was put into your GI tract during the procedure and reduce the bloating. If you had a lower endoscopy (such as a colonoscopy or flexible sigmoidoscopy) you may notice spotting of blood in your stool or on the toilet paper. If you underwent a bowel prep for your procedure, you may not have a normal bowel movement for a few days.  Please Note:  You might notice some irritation and congestion in your nose or some drainage.  This is from the oxygen used during your procedure.  There is no need for concern and it should clear up in a day or so.  SYMPTOMS TO REPORT IMMEDIATELY:   Following lower endoscopy (colonoscopy or flexible sigmoidoscopy):  Excessive amounts of blood in the stool  Significant tenderness or worsening of abdominal pains  Swelling of the abdomen that is new, acute  Fever of 100F or higher   For urgent or emergent issues, a gastroenterologist can be reached at any hour by calling [phone].   DIET:  We do recommend a small meal at first, but then you may proceed to your regular diet.  Drink plenty of fluids but you should avoid alcoholic beverages for 24 hours.  ACTIVITY:  You should plan to take it easy for the rest of today and you should NOT DRIVE or use heavy machinery until tomorrow (because of the sedation medicines used during the test).     FOLLOW UP: Our staff will call the number listed on your records the next business day following your procedure to check on you and address any questions or concerns that you may have regarding the information given to you following your procedure. If we do not reach you, we will leave a message.  However, if you are feeling well and you are not experiencing any problems, there is no need to return our call.  We will assume that you have returned to your regular daily activities without incident.  If any biopsies were taken you will be contacted by phone or by letter within the next 1-3 weeks.  Please call us at [phone] if you have not heard about the biopsies in 3 weeks.    SIGNATURES/CONFIDENTIALITY: You and/or your care partner have signed paperwork which will be entered into your electronic medical record.  These signatures attest to the fact that that the information above on your After Visit Summary has been reviewed and is understood.  Full responsibility of the confidentiality of this discharge information lies with you and/or your care-partner.    Handouts were given to your care partner on polyps, hemorrhoids, and diverticulosis. NO ASPIRIN, ASPIRIN CONTAINING PRODUCTS (BC OR GOODY POWDERS) OR NSAIDS (IBUPROFEN, ADVIL, ALEVE, AND MOTRIN) FOR 2 weeks; TYLENOL IS OK TO TAKE. Resume your PLAVIX in 2 days.  Restart Wednesday.   You may resume your other current  medications today. Await biopsy results. Please call if any questions or concerns.

## 2017-10-30 NOTE — Progress Notes (Signed)
Pt stated, "I feel better and ready to go home".  He passed more flatus in the restroom. He was given coffee to drink.  His abdomen was soft.  Ready for discharge.  maw

## 2017-10-30 NOTE — Progress Notes (Signed)
Pt. Reports no change in his medical or surgical history since his pre-visit 10/10/2017.

## 2017-10-31 ENCOUNTER — Telehealth: Payer: Self-pay | Admitting: *Deleted

## 2017-10-31 NOTE — Telephone Encounter (Signed)
No answer for post procedure call back . Sm

## 2017-10-31 NOTE — Telephone Encounter (Signed)
  Follow up Call-  Call back number 10/30/2017  Post procedure Call Back phone  # [phone]  Permission to leave phone message Yes  Some recent data might be hidden     Patient questions:  Do you have a fever, pain , or abdominal swelling? No. Pain Score  0 *  Have you tolerated food without any problems? Yes.    Have you been able to return to your normal activities? Yes.    Do you have any questions about your discharge instructions: Diet   No. Medications  No. Follow up visit  No.  Do you have questions or concerns about your Care? No.  Actions: * If pain score is 4 or above: No action needed, pain <4.

## 2017-11-06 NOTE — Telephone Encounter (Signed)
ERROR

## 2017-11-09 ENCOUNTER — Encounter: Payer: Self-pay | Admitting: Gastroenterology

## 2017-12-14 ENCOUNTER — Encounter: Payer: Self-pay | Admitting: Podiatry

## 2017-12-14 ENCOUNTER — Ambulatory Visit (INDEPENDENT_AMBULATORY_CARE_PROVIDER_SITE_OTHER): Payer: Medicare HMO | Admitting: Podiatry

## 2017-12-14 DIAGNOSIS — I739 Peripheral vascular disease, unspecified: Secondary | ICD-10-CM | POA: Diagnosis not present

## 2017-12-14 DIAGNOSIS — M79671 Pain in right foot: Secondary | ICD-10-CM

## 2017-12-14 DIAGNOSIS — B351 Tinea unguium: Secondary | ICD-10-CM

## 2017-12-14 DIAGNOSIS — M79672 Pain in left foot: Secondary | ICD-10-CM

## 2017-12-14 NOTE — Progress Notes (Signed)
Subjective: 72 y.o. year old male patient presents complaining of painful nails. Patient requests toe nails trimmed. Stated that his blood sugar is still at 250 level. He is working on losing weight.  Objective: Dermatologic: Thick yellow deformed nails x 10. Vascular: Pedal pulses are not palpable. Orthopedic: Cavus type foot without gross deformities. Neurologic: All epicritic and tactile sensations grossly intact.  Assessment: Dystrophic mycotic nails x 10. Uncontrolled IDDM. PVD left lower limb.  Treatment: All mycotic nails debrided.  Return in 3 months or as needed.

## 2017-12-14 NOTE — Patient Instructions (Signed)
Seen for hypertrophic nails and painful calluses. All nails and calluses debrided. Return in 3 months or as needed.  

## 2017-12-27 ENCOUNTER — Ambulatory Visit (INDEPENDENT_AMBULATORY_CARE_PROVIDER_SITE_OTHER): Payer: Medicare HMO | Admitting: Vascular Surgery

## 2017-12-27 ENCOUNTER — Ambulatory Visit (INDEPENDENT_AMBULATORY_CARE_PROVIDER_SITE_OTHER)
Admission: RE | Admit: 2017-12-27 | Discharge: 2017-12-27 | Disposition: A | Discharge status: Home / Self Care | Payer: Medicare HMO | Source: Ambulatory Visit | Attending: Vascular Surgery | Admitting: Vascular Surgery

## 2017-12-27 ENCOUNTER — Encounter: Payer: Self-pay | Admitting: Vascular Surgery

## 2017-12-27 ENCOUNTER — Ambulatory Visit (HOSPITAL_COMMUNITY)
Admission: RE | Admit: 2017-12-27 | Discharge: 2017-12-27 | Disposition: A | Discharge status: Home / Self Care | Payer: Medicare HMO | Source: Ambulatory Visit | Attending: Vascular Surgery | Admitting: Vascular Surgery

## 2017-12-27 VITALS — BP 127/81 | HR 79 | Temp 97.3°F | Resp 20 | Ht 72.0 in | Wt 208.0 lb

## 2017-12-27 DIAGNOSIS — I999 Unspecified disorder of circulatory system: Secondary | ICD-10-CM | POA: Insufficient documentation

## 2017-12-27 DIAGNOSIS — I739 Peripheral vascular disease, unspecified: Secondary | ICD-10-CM

## 2017-12-27 NOTE — Progress Notes (Signed)
HISTORY AND PHYSICAL     CC:  Yearly check up. Requesting Provider:  Glendale Chard, MD  HPI: This is a 72 y.o. male who has undergone a previous aortobifemoral bypass graft in 2006 and a left femoropopliteal bypass graft in 2012.  In 2013, he did have a vein graft stenosis and underwent a revision of his left fem pop bypass with Vein patch angioplasty of vein graft stenosis in the above-knee segment of femoropopliteal bypass graft using segment of left greater saphenous vein and extension of the femoral popliteal bypass graft to the tibial peroneal trunk using reversed right GSV.    Dr. Scot Dock has also been following his carotid disease and at the last visit, his carotids were < 39% and therefore, he will have his carotid duplex next year.  He is here today for his one year follow up.    He states that he gets a pain just above his buttocks bilaterally when he walks his dog.  Otherwise, he does not have any complaints or claudication sx.  He states that when he is driving or sitting, he gets a shooting pain down the left leg and has an appt to see a neurosurgeon in a couple of weeks.  He states his foot will turn purple occasionally and then back to normal.   He does get his toe nails trimmed by the podiatrist.    He denies any stroke sx including amaurosis fugax, weakness/numbness/clumsiness of extremities or speech difficulty.  They continue to keep an eye on his brain tumors.  Will visit with MD in the future to determine if radiation is an option.  He states he does have some additional stress with his 36 y/o granddaughter who has a degenerative joint disease with multiple broken bones and will be going to [facility] in the future for experimental treatments.    He is on a CCB & ARB for blood pressure management.  He is on Plavix daily.  He takes oral agents for his diabetes.  He is on inhalers for his COPD.    He quit smoking 5 years ago.    Past Medical History:  Diagnosis Date  . Arthritis    . Bronchitis    hx of  . COPD (chronic obstructive pulmonary disease) ([place])   . Diabetes mellitus   . DVT (deep venous thrombosis) ([place])   . GERD (gastroesophageal reflux disease)   . Hyperlipidemia   . Hypertension   . Hypothyroidism   . Onychomycosis 06/07/2013  . Stroke [facility])    TIA's 01/05/11-01/08/11-01/26/11  . Thyroid disease   . TIA (transient ischemic attack) 08/03/2011   Left sided weakness. 3 episodes Feb-March  . TIA (transient ischemic attack)    x 3    Past Surgical History:  Procedure Laterality Date  . ABDOMINAL AORTAGRAM N/A 06/04/2012   Procedure: ABDOMINAL Maxcine Ham;  Surgeon: Angelia Mould, MD;  Location: [facility] CATH LAB;  Service: Cardiovascular;  Laterality: N/A;  . APPENDECTOMY    . COLONOSCOPY    . FEMORAL-POPLITEAL BYPASS GRAFT  07/05/2012   Procedure: BYPASS GRAFT FEMORAL-POPLITEAL ARTERY;  Surgeon: Angelia Mould, MD;  Location: [facility] OR;  Service: Vascular;  Laterality: Left;  Revision Left fem/pop w/ artery patch angioplasty utilizing left GSV graft and extenion to tib/per trunk w/right GSV and interop arteriogram  . JOINT REPLACEMENT  2011   -Hip  . NASAL SINUS SURGERY    . PR VEIN BYPASS GRAFT,AORTO-FEM-POP    . PR VEIN BYPASS GRAFT,AORTO-FEM-POP  08-01-11   (  L) Fem-pop BP  . toe nail    . TONSILLECTOMY      Allergies  Allergen Reactions  . Eszopiclone Itching  . Niaspan [Niacin Er] Nausea And Vomiting and Rash    Current Outpatient Medications  Medication Sig Dispense Refill  . albuterol-ipratropium (COMBIVENT) 18-103 MCG/ACT inhaler Inhale 1 puff into the lungs every morning.    Marland Kitchen amLODipine (NORVASC) 10 MG tablet Take 10 mg by mouth daily.    . budesonide-formoterol (SYMBICORT) 80-4.5 MCG/ACT inhaler Inhale 2 puffs into the lungs 2 (two) times daily. 1 Inhaler 3  . Cholecalciferol (VITAMIN D) 2000 UNITS CAPS Take 2,000 Units by mouth daily.    . clopidogrel (PLAVIX) 75 MG tablet Take 75 mg by mouth daily.      . empagliflozin  (JARDIANCE) 10 MG TABS tablet Take 10 mg by mouth daily.    . Fluticasone-Salmeterol (ADVAIR DISKUS) 250-50 MCG/DOSE AEPB Inhale 1 puff into the lungs 2 (two) times daily. 60 each 3  . furosemide (LASIX) 20 MG tablet Take 20 mg by mouth daily.     . hydrochlorothiazide (HYDRODIURIL) 25 MG tablet Take 25 mg by mouth 2 (two) times daily.    . insulin regular human CONCENTRATED (HUMULIN R) 500 UNIT/ML SOLN injection 50 units in the morning, 40 units at lunch, and 60 units at dinner.    Marland Kitchen ipratropium-albuterol (DUONEB) 0.5-2.5 (3) MG/3ML SOLN Take 3 mLs by nebulization every 8 (eight) hours. 60 mL 0  . levothyroxine (SYNTHROID, LEVOTHROID) 150 MCG tablet Take 150 mcg by mouth daily before breakfast.    . metFORMIN (GLUCOPHAGE) 500 MG tablet Take 500 mg by mouth 2 (two) times daily with a meal.    . Multiple Vitamin (MULTIVITAMIN WITH MINERALS) TABS tablet Take 1 tablet by mouth daily.    . saxagliptin HCl (ONGLYZA) 5 MG TABS tablet Take 5 mg by mouth daily.    . sildenafil (REVATIO) 20 MG tablet Take 20 mg by mouth.    . Testosterone 10 MG/ACT (2%) GEL daily.  0  . valsartan (DIOVAN) 320 MG tablet Take 320 mg by mouth daily.     No current facility-administered medications for this visit.     Family History  Problem Relation Age of Onset  . Stroke Mother   . Diabetes Mother   . Other Father        alzheimers  . Diabetes Father   . Stroke Father   . Diabetes Sister     Social History   Socioeconomic History  . Marital status: Married    Spouse name: Not on file  . Number of children: Not on file  . Years of education: Not on file  . Highest education level: Not on file  Social Needs  . Financial resource strain: Not on file  . Food insecurity - worry: Not on file  . Food insecurity - inability: Not on file  . Transportation needs - medical: Not on file  . Transportation needs - non-medical: Not on file  Occupational History  . Not on file  Tobacco Use  . Smoking status:  Current Every Day Smoker    Packs/day: 0.50    Years: 5.00    Pack years: 2.50    Types: Cigarettes    Last attempt to quit: 08/02/2011    Years since quitting: 6.4  . Smokeless tobacco: Never Used  Substance and Sexual Activity  . Alcohol use: Yes    Alcohol/week: 0.0 oz    Comment: rare  . Drug  use: No  . Sexual activity: Not on file  Other Topics Concern  . Not on file  Social History Narrative  . Not on file     REVIEW OF SYSTEMS:   [X]  denotes positive finding, [ ]  denotes negative finding Cardiac  Comments:  Chest pain or chest pressure:    Shortness of breath upon exertion:    Short of breath when lying flat:    Irregular heart rhythm:        Vascular    Pain in calf, thigh, or hip brought on by ambulation: x See HPI  Pain in feet at night that wakes you up from your sleep:     Blood clot in your veins:    Leg swelling:         Pulmonary    Oxygen at home:    Productive cough:     Wheezing:         Neurologic    Sudden weakness in arms or legs:     Sudden numbness in arms or legs:     Sudden onset of difficulty speaking or slurred speech:    Temporary loss of vision in one eye:     Problems with dizziness:         Gastrointestinal    Blood in stool:     Vomited blood:         Genitourinary    Burning when urinating:     Blood in urine:        Psychiatric    Major depression:         Hematologic    Bleeding problems:    Problems with blood clotting too easily:        Skin    Rashes or ulcers:        Constitutional    Fever or chills:      PHYSICAL EXAMINATION:  Vitals:   12/27/17 1524  BP: 127/81  Pulse: 79  Resp: 20  Temp: (!) 97.3 F (36.3 C)  SpO2: 97%   Vitals:   12/27/17 1524  Weight: 208 lb (94.3 kg)  Height: 6' (1.829 m)     General:  WDWN in NAD; vital signs documented above Gait: Not observed HENT: WNL, normocephalic Pulmonary: normal non-labored breathing , without Rales, rhonchi,  wheezing Cardiac: regular HR,  without  Murmurs without carotid bruits Abdomen: soft, NT, no masses; well healed laparotomy scar Skin: without rashes Vascular Exam/Pulses:  Right Left  Radial 2+ (normal) 2+ (normal)  Ulnar 1+ (weak) 1+ (weak)  Femoral 2+ (normal) 2+ (normal)  Popliteal Unable to palpate  Unable to palpate   DP 2+ (normal) 2+ (normal)  PT 2+ (normal) 2+ (normal)   Extremities: without ischemic changes, without Gangrene , without cellulitis; without open wounds; +spider veins/teleangectasias bilateral feet; -swelling bilaterally Musculoskeletal: no muscle wasting or atrophy  Neurologic: A&O X 3;  No focal weakness or paresthesias are detected Psychiatric:  The pt has Normal affect.   Non-Invasive Vascular Imaging:   ABI's 12/27/17: Right:  1.05 Left:  0.95 Previous ABI's on 12/21/16: Right:  1.12 Left:  0.99  Arterial duplex 12/27/17: -Patent left limb of the aorta to femoral bypass graft -patent left femoral to popliteal bypass graft with no evidence for restenosis  Pt meds includes: Statin:  No. Beta Blocker:  No. Aspirin:  No. ACEI:  No. ARB:  No. CCB use:  Yes Other Antiplatelet/Anticoagulant:  Yes Plavix   ASSESSMENT/PLAN:: 72 y.o. male who has undergone aortobifemoral bypass  grafting in 2006 and a left femoral to popliteal bypass grafting in 2012 with revision in 2013.   -pt doing well and aortobifemoral bypass is patent as well as left femoral to popliteal bypass graft.  He has palpable pedal pulses.  Do not feel that the pain he has just above his buttocks is related to his arterial disease.  He does have an appointment with a neurosurgeon in the next couple of weeks.  -he is asymptomatic from his carotid disease, which his carotid duplex a year ago was <39% stenosis bilaterally.   -he will return in one year with ABI's, LLE arterial duplex and bilateral carotid duplex -he will contact us sooner if he has any issues.    Leontine Locket, PA-C [facility] [phone]  Clinic MD:  Pt seen and examined with Dr. Scot Dock

## 2018-01-03 ENCOUNTER — Ambulatory Visit (INDEPENDENT_AMBULATORY_CARE_PROVIDER_SITE_OTHER): Payer: Medicare HMO | Admitting: Orthopaedic Surgery

## 2018-01-03 ENCOUNTER — Ambulatory Visit (INDEPENDENT_AMBULATORY_CARE_PROVIDER_SITE_OTHER): Payer: Medicare HMO

## 2018-01-03 ENCOUNTER — Encounter (INDEPENDENT_AMBULATORY_CARE_PROVIDER_SITE_OTHER): Payer: Self-pay | Admitting: Orthopaedic Surgery

## 2018-01-03 VITALS — BP 137/79 | HR 72 | Ht 72.0 in | Wt 208.0 lb

## 2018-01-03 DIAGNOSIS — M545 Low back pain: Secondary | ICD-10-CM | POA: Diagnosis not present

## 2018-01-03 NOTE — Progress Notes (Signed)
Office Visit Note   Patient: Sean Saunders           Date of Birth: [birth year]           MRN: [id number] Visit Date: 01/03/2018              Requested by: Martinique, Betty G, MD [address] [place], [place] [address] PCP: Martinique, Betty G, MD   Assessment & Plan: Visit Diagnoses:  1. Acute bilateral low back pain, with sciatica presence unspecified     Plan: Patient likely has lumbar disc bulge with intermittent left.  Able to ambulate and walk his dog.  We will recheck him in 5 weeks.  If he gets progressive symptoms we can consider diagnostic imaging.  Follow-Up Instructions: No Follow-up on file.   Orders:  Orders Placed This Encounter  Procedures  . XR Lumbar Spine 2-3 Views   No orders of the defined types were placed in this encounter.     Procedures: No procedures performed   Clinical Data: No additional findings.   Subjective: Chief Complaint  Patient presents with  . Lower Back - Pain    HPI-year-old male low back pain with no known injury and symptoms times 2 months.  Call us back worse when he walks his dog in the cold.  Occasionally has had intermittent shooting pain that radiates down his left leg down to the ankle it stays there last for short period of time and then resolved.  Ibuprofen and heat.  No numbness or tingling patient is a diabetic and has some peripheral neuropathy.  Denies lower extremity weakness.  Review of Systems Positive for hypertension.  Previous right hip replacement [place] 2011.  TIA 2012, in January x2 and third TIA February 2012.  Carotid artery narrowing.  Aortobifemoral bypass, PAD.  Type 2 diabetes on insulin and metformin.  Angioma of the brain which is being followed.  Objective: Vital Signs: BP 137/79   Pulse 72   Ht 6' (1.829 m)   Wt 208 lb (94.3 kg)   BMI 28.21 kg/m   Physical Exam  Constitutional: He is oriented to person, place, and time. He appears well-developed and well-nourished.  HENT:  Head:  Normocephalic and atraumatic.  Eyes: EOM are normal. Pupils are equal, round, and reactive to light.  Neck: No tracheal deviation present. No thyromegaly present.  Cardiovascular: Normal rate.  Pulmonary/Chest: Effort normal. He has no wheezes.  Abdominal: Soft. Bowel sounds are normal.  Neurological: He is alert and oriented to person, place, and time.  Skin: Skin is warm and dry. Capillary refill takes less than 2 seconds.  Psychiatric: He has a normal mood and affect. His behavior is normal. Judgment and thought content normal.    Ortho Exam patient has intact reflexes.  Slight decreased sensation both ankles and feet.  Jerk are intact.  Anterior tib EHL quads hamstrings are normal.  His hip range of motion.  Well-healed right anterior total hip arthroplasty incision.  No quad weakness.  Heel compression test.  No plantar foot lesions  Specialty Comments:  No specialty comments available.  Imaging: No results found.   PMFS History: Patient Active Problem List   Diagnosis Date Noted  . Rhinitis, allergic 09/25/2017  . Tobacco use disorder 09/11/2017  . COPD (chronic obstructive pulmonary disease) ([place]) 09/10/2017  . Hypertension with heart disease 09/04/2017  . Pain in lower limb 03/21/2014  . Diabetes type 2, uncontrolled ([place]) 03/21/2014  . PVD (peripheral vascular disease) ([place])  12/11/2013  . Occlusion and stenosis of carotid artery without mention of cerebral infarction 12/11/2013  . Onychomycosis 06/07/2013  . Pain in joint, ankle and foot 06/07/2013  . Callus of foot 06/07/2013  . Peripheral vascular disease, unspecified ([place]) 05/29/2013  . Aftercare following surgery of the circulatory system, [place] 05/29/2013  . Other postoperative infection 11/13/2012  . Neuropathic pain of lower extremity 11/23/2011  . Atherosclerosis of native arteries of the extremities with intermittent claudication 10/12/2011   Past Medical History:  Diagnosis Date  . Arthritis   . Bronchitis     hx of  . COPD (chronic obstructive pulmonary disease) ([place])   . Diabetes mellitus   . DVT (deep venous thrombosis) ([place])   . GERD (gastroesophageal reflux disease)   . Hyperlipidemia   . Hypertension   . Hypothyroidism   . Onychomycosis 06/07/2013  . Stroke [facility])    TIA's 01/05/11-01/08/11-01/26/11  . Thyroid disease   . TIA (transient ischemic attack) 08/03/2011   Left sided weakness. 3 episodes Feb-March  . TIA (transient ischemic attack)    x 3    Family History  Problem Relation Age of Onset  . Stroke Mother   . Diabetes Mother   . Other Father        alzheimers  . Diabetes Father   . Stroke Father   . Diabetes Sister     Past Surgical History:  Procedure Laterality Date  . ABDOMINAL AORTAGRAM N/A 06/04/2012   Procedure: ABDOMINAL Maxcine Ham;  Surgeon: Angelia Mould, MD;  Location: [facility] CATH LAB;  Service: Cardiovascular;  Laterality: N/A;  . APPENDECTOMY    . COLONOSCOPY    . FEMORAL-POPLITEAL BYPASS GRAFT  07/05/2012   Procedure: BYPASS GRAFT FEMORAL-POPLITEAL ARTERY;  Surgeon: Angelia Mould, MD;  Location: [facility] OR;  Service: Vascular;  Laterality: Left;  Revision Left fem/pop w/ artery patch angioplasty utilizing left GSV graft and extenion to tib/per trunk w/right GSV and interop arteriogram  . JOINT REPLACEMENT  2011   -Hip  . NASAL SINUS SURGERY    . PR VEIN BYPASS GRAFT,AORTO-FEM-POP    . PR VEIN BYPASS GRAFT,AORTO-FEM-POP  08-01-11   (L) Fem-pop BP  . toe nail    . TONSILLECTOMY     Social History   Occupational History  . Not on file  Tobacco Use  . Smoking status: Current Every Day Smoker    Packs/day: 0.50    Years: 5.00    Pack years: 2.50    Types: Cigarettes    Last attempt to quit: 08/02/2011    Years since quitting: 6.4  . Smokeless tobacco: Never Used  Substance and Sexual Activity  . Alcohol use: Yes    Alcohol/week: 0.0 oz    Comment: rare  . Drug use: No  . Sexual activity: Not on file

## 2018-01-04 ENCOUNTER — Encounter (INDEPENDENT_AMBULATORY_CARE_PROVIDER_SITE_OTHER): Payer: Self-pay | Admitting: Orthopaedic Surgery

## 2018-01-15 ENCOUNTER — Other Ambulatory Visit: Payer: Self-pay

## 2018-01-15 ENCOUNTER — Ambulatory Visit: Payer: Medicare HMO | Attending: Orthopaedic Surgery

## 2018-01-15 DIAGNOSIS — M62830 Muscle spasm of back: Secondary | ICD-10-CM | POA: Diagnosis not present

## 2018-01-15 DIAGNOSIS — R293 Abnormal posture: Secondary | ICD-10-CM | POA: Insufficient documentation

## 2018-01-15 DIAGNOSIS — M5442 Lumbago with sciatica, left side: Secondary | ICD-10-CM | POA: Insufficient documentation

## 2018-01-15 NOTE — Therapy (Signed)
[place], [place], [address] Phone: [phone]   Fax:  [phone]  Physical Therapy Evaluation  Patient Details  Name: Sean Saunders MRN: [id number] Date of Birth: 17-Apr-1946 Referring Provider: Rodell Perna, MD   Encounter Date: 01/15/2018  PT End of Session - 01/15/18 1332    Visit Number  1    Number of Visits  12    Date for PT Re-Evaluation  02/23/18    PT Start Time  1223    PT Stop Time  1310    PT Time Calculation (min)  47 min    Activity Tolerance  Patient tolerated treatment well;No increased pain    Behavior During Therapy  WFL for tasks assessed/performed       Past Medical History:  Diagnosis Date  . Arthritis   . Bronchitis    hx of  . COPD (chronic obstructive pulmonary disease) ([place])   . Diabetes mellitus   . DVT (deep venous thrombosis) ([place])   . GERD (gastroesophageal reflux disease)   . Hyperlipidemia   . Hypertension   . Hypothyroidism   . Onychomycosis 06/07/2013  . Stroke [facility])    TIA's 01/05/11-01/08/11-01/26/11  . Thyroid disease   . TIA (transient ischemic attack) 08/03/2011   Left sided weakness. 3 episodes Feb-March  . TIA (transient ischemic attack)    x 3    Past Surgical History:  Procedure Laterality Date  . ABDOMINAL AORTAGRAM N/A 06/04/2012   Procedure: ABDOMINAL Maxcine Ham;  Surgeon: Angelia Mould, MD;  Location: [facility] CATH LAB;  Service: Cardiovascular;  Laterality: N/A;  . APPENDECTOMY    . COLONOSCOPY    . FEMORAL-POPLITEAL BYPASS GRAFT  07/05/2012   Procedure: BYPASS GRAFT FEMORAL-POPLITEAL ARTERY;  Surgeon: Angelia Mould, MD;  Location: [facility] OR;  Service: Vascular;  Laterality: Left;  Revision Left fem/pop w/ artery patch angioplasty utilizing left GSV graft and extenion to tib/per trunk w/right GSV and interop arteriogram  . JOINT REPLACEMENT  2011   -Hip  . NASAL SINUS SURGERY    . PR VEIN BYPASS GRAFT,AORTO-FEM-POP    . PR VEIN BYPASS  GRAFT,AORTO-FEM-POP  08-01-11   (L) Fem-pop BP  . toe nail    . TONSILLECTOMY      There were no vitals filed for this visit.   Subjective Assessment - 01/15/18 1328    Subjective  Reports pain in buttock .  He report onset without injury.    Reports bilateral buttock pain  and LT thigh to medial LT calf.       Limitations  Walking cold    How long can you sit comfortably?  Leg pain  10 min maybe more    How long can you walk comfortably?  As needed  but may have to stop at times    Diagnostic tests  xray:  DDD L4-5     Currently in Pain?  Yes    Pain Location  Buttocks    Pain Orientation  Left;Right    Pain Descriptors / Indicators  Shooting    Pain Type  Acute pain    Pain Radiating Towards  into Lt leg     Pain Onset  1 to 4 weeks ago    Pain Frequency  Intermittent    Aggravating Factors   standing for back , sitting for leg    Pain Relieving Factors  changing positions    Multiple Pain Sites  No  [facility] PT Assessment - 01/15/18 0001      Assessment   Medical Diagnosis  LBP withLBP with sciatica    Referring Provider  Rodell Perna, MD    Onset Date/Surgical Date  -- 3 weeks ago    Next MD Visit  4-5 weeks    Prior Therapy  No      Precautions   Precautions  None      Restrictions   Weight Bearing Restrictions  No      Prior Function   Level of Independence  Independent      Cognition   Overall Cognitive Status  Within Functional Limits for tasks assessed      Posture/Postural Control   Posture Comments  rounded back and shoulders  LT ilia higher than RT        ROM / Strength   AROM / PROM / Strength  AROM;Strength      AROM   AROM Assessment Site  Lumbar    Lumbar Flexion  40    Lumbar Extension  15    Lumbar - Right Side Bend  10    Lumbar - Left Side Bend  10      Strength   Overall Strength Comments  WNL both LE      Flexibility   Soft Tissue Assessment /Muscle Length  yes    Hamstrings  45 dgres bilatera      Palpation   SI  assessment   RT leg shorter than LT.     Palpation comment  stiffness of lower and mid back  mild tenderness in soft tissue lateral sacrum L.R             Objective measurements completed on examination: See above findings.              PT Education - 01/15/18 1352    Education provided  Yes    Education Details  POC,  HEP    Person(s) Educated  Patient    Methods  Explanation;Demonstration;Verbal cues;Handout    Comprehension  Returned demonstration;Verbalized understanding       PT Short Term Goals - 01/15/18 1320      PT SHORT TERM GOAL #1   Title  He will be independent with initial HEP    Time  2    Period  Weeks    Status  New      PT SHORT TERM GOAL #2   Title  He will report leg symptoms decred in frequency or intensith by 50%    Time  3    Period  Weeks    Status  New        PT Long Term Goals - 01/15/18 1321      PT LONG TERM GOAL #1   Title  He will e independnent in all hEP issued    Time  6    Period  Weeks    Status  New      PT LONG TERM GOAL #2   Title  He will report no leg symptoms    Time  6    Period  Weeks    Status  New      PT LONG TERM GOAL #3   Title  He will report LBP as intermittant and no more than mild    Time  6    Period  Weeks    Status  New      PT LONG TERM GOAL #4   Title  He will be able to walk 1/2 to 3/4 mile with dog with no leg pain    Time  6    Period  Weeks    Status  New      PT LONG TERM GOAL #5   Title  He will be able to ride in car for 60 min without leg symptoms    Time  6    Period  Weeks    Status  New             Plan - 01/15/18 1332    Clinical Presentation  Evolving    Clinical Decision Making  Moderate    Rehab Potential  Good    PT Frequency  2x / week    PT Duration  6 weeks    PT Treatment/Interventions  Passive range of motion;Dry needling;Patient/family education;Therapeutic exercise;Iontophoresis 4mg /ml Dexamethasone;Moist Heat;Traction;Manual techniques     Consulted and Agree with Plan of Care  Patient       Patient will benefit from skilled therapeutic intervention in order to improve the following deficits and impairments:  Pain, Postural dysfunction, Increased muscle spasms, Difficulty walking, Decreased activity tolerance  Visit Diagnosis: Abnormal posture  Muscle spasm of back  Acute bilateral low back pain with left-sided sciatica     Problem List Patient Active Problem List   Diagnosis Date Noted  . Rhinitis, allergic 09/25/2017  . Tobacco use disorder 09/11/2017  . COPD (chronic obstructive pulmonary disease) ([place]) 09/10/2017  . Hypertension with heart disease 09/04/2017  . Pain in lower limb 03/21/2014  . Diabetes type 2, uncontrolled ([place]) 03/21/2014  . PVD (peripheral vascular disease) ([place]) 12/11/2013  . Occlusion and stenosis of carotid artery without mention of cerebral infarction 12/11/2013  . Onychomycosis 06/07/2013  . Pain in joint, ankle and foot 06/07/2013  . Callus of foot 06/07/2013  . Peripheral vascular disease, unspecified ([place]) 05/29/2013  . Aftercare following surgery of the circulatory system, [place] 05/29/2013  . Other postoperative infection 11/13/2012  . Neuropathic pain of lower extremity 11/23/2011  . Atherosclerosis of native arteries of the extremities with intermittent claudication 10/12/2011    Darrel Hoover  PT 01/15/2018, 2:33 PM  [facility] [address] [place], [place], [address] Phone: [phone]   Fax:  [phone]  Name: FARAZ PONCIANO MRN: [id number] Date of Birth: 1946/07/19

## 2018-01-15 NOTE — Patient Instructions (Signed)
Issued info on sitting posture with support ,  Bridge x 10 2x/day, prone on elbows 30-60 sec x 2-3 2-3x/day and standing extension 2-3 reps after sitting

## 2018-01-22 ENCOUNTER — Encounter: Payer: Self-pay | Admitting: Physical Therapy

## 2018-01-22 ENCOUNTER — Encounter: Payer: Self-pay | Admitting: Family Medicine

## 2018-01-22 ENCOUNTER — Ambulatory Visit (INDEPENDENT_AMBULATORY_CARE_PROVIDER_SITE_OTHER): Payer: Medicare HMO | Admitting: Family Medicine

## 2018-01-22 ENCOUNTER — Ambulatory Visit: Payer: Medicare HMO | Admitting: Physical Therapy

## 2018-01-22 VITALS — BP 160/84 | HR 104 | Temp 99.5°F | Resp 20 | Wt 214.8 lb

## 2018-01-22 DIAGNOSIS — R0981 Nasal congestion: Secondary | ICD-10-CM | POA: Diagnosis not present

## 2018-01-22 DIAGNOSIS — I119 Hypertensive heart disease without heart failure: Secondary | ICD-10-CM

## 2018-01-22 DIAGNOSIS — B9789 Other viral agents as the cause of diseases classified elsewhere: Secondary | ICD-10-CM

## 2018-01-22 DIAGNOSIS — R293 Abnormal posture: Secondary | ICD-10-CM

## 2018-01-22 DIAGNOSIS — M5442 Lumbago with sciatica, left side: Secondary | ICD-10-CM | POA: Diagnosis not present

## 2018-01-22 DIAGNOSIS — M62830 Muscle spasm of back: Secondary | ICD-10-CM

## 2018-01-22 DIAGNOSIS — R509 Fever, unspecified: Secondary | ICD-10-CM

## 2018-01-22 DIAGNOSIS — J988 Other specified respiratory disorders: Secondary | ICD-10-CM

## 2018-01-22 MED ORDER — HYDROCODONE-HOMATROPINE 5-1.5 MG/5ML PO SYRP: 5.0000 mL | ORAL_SOLUTION | Freq: Two times a day (BID) | ORAL | 0 refills | Status: AC | PRN

## 2018-01-22 MED ORDER — OSELTAMIVIR PHOSPHATE 75 MG PO CAPS: 75.0000 mg | ORAL_CAPSULE | Freq: Two times a day (BID) | ORAL | 0 refills | Status: AC

## 2018-01-22 NOTE — Patient Instructions (Addendum)
A few things to remember from today's visit:   Hypertension with heart disease  Fever, unspecified  Viral respiratory illness  Albuterol inh 2 puff every 6 hours for a week then as needed for wheezing or shortness of breath.   Monitor blood pressure at home.   Influenza, Adult Influenza ("the flu") is an infection in the lungs, nose, and throat (respiratory tract). It is caused by a virus. The flu causes many common cold symptoms, as well as a high fever and body aches. It can make you feel very sick. The flu spreads easily from person to person (is contagious). Getting a flu shot (influenza vaccination) every year is the best way to prevent the flu. Follow these instructions at home:  Take over-the-counter and prescription medicines only as told by your doctor.  Use a cool mist humidifier to add moisture (humidity) to the air in your home. This can make it easier to breathe.  Rest as needed.  Drink enough fluid to keep your pee (urine) clear or pale yellow.  Cover your mouth and nose when you cough or sneeze.  Wash your hands with soap and water often, especially after you cough or sneeze. If you cannot use soap and water, use hand sanitizer.  Stay home from work or school as told by your doctor. Unless you are visiting your doctor, try to avoid leaving home until your fever has been gone for 24 hours without the use of medicine.  Keep all follow-up visits as told by your doctor. This is important. How is this prevented?  Getting a yearly (annual) flu shot is the best way to avoid getting the flu. You may get the flu shot in late summer, fall, or winter. Ask your doctor when you should get your flu shot.  Wash your hands often or use hand sanitizer often.  Avoid contact with people who are sick during cold and flu season.  Eat healthy foods.  Drink plenty of fluids.  Get enough sleep.  Exercise regularly. Contact a doctor if:  You get new symptoms.  You  have: ? Chest pain. ? Watery poop (diarrhea). ? A fever.  Your cough gets worse.  You start to have more mucus.  You feel sick to your stomach (nauseous).  You throw up (vomit). Get help right away if:  You start to be short of breath or have trouble breathing.  Your skin or nails turn a bluish color.  You have very bad pain or stiffness in your neck.  You get a sudden headache.  You get sudden pain in your face or ear.  You cannot stop throwing up. This information is not intended to replace advice given to you by your health care provider. Make sure you discuss any questions you have with your health care provider. Document Released: 09/06/2008 Document Revised: 05/05/2016 Document Reviewed: 09/22/2015 Elsevier Interactive Patient Education  2017 [employer].  Please be sure medication list is accurate. If a new problem present, please set up appointment sooner than planned today.

## 2018-01-22 NOTE — Therapy (Addendum)
[place], [place], [address] Phone: [phone]   Fax:  [phone]  Physical Therapy Treatment/Discharge  Patient Details  Name: Sean Saunders MRN: [id number] Date of Birth: Oct 28, 1946 Referring Provider: Rodell Perna, MD   Encounter Date: 01/22/2018  PT End of Session - 01/22/18 1324    Visit Number  2    Number of Visits  12    Date for PT Re-Evaluation  02/23/18    PT Start Time  0100    PT Stop Time  0145    PT Time Calculation (min)  45 min       Past Medical History:  Diagnosis Date  . Arthritis   . Bronchitis    hx of  . COPD (chronic obstructive pulmonary disease) ([place])   . Diabetes mellitus   . DVT (deep venous thrombosis) ([place])   . GERD (gastroesophageal reflux disease)   . Hyperlipidemia   . Hypertension   . Hypothyroidism   . Onychomycosis 06/07/2013  . Stroke [facility])    TIA's 01/05/11-01/08/11-01/26/11  . Thyroid disease   . TIA (transient ischemic attack) 08/03/2011   Left sided weakness. 3 episodes Feb-March  . TIA (transient ischemic attack)    x 3    Past Surgical History:  Procedure Laterality Date  . ABDOMINAL AORTAGRAM N/A 06/04/2012   Procedure: ABDOMINAL Maxcine Ham;  Surgeon: Angelia Mould, MD;  Location: [facility] CATH LAB;  Service: Cardiovascular;  Laterality: N/A;  . APPENDECTOMY    . COLONOSCOPY    . FEMORAL-POPLITEAL BYPASS GRAFT  07/05/2012   Procedure: BYPASS GRAFT FEMORAL-POPLITEAL ARTERY;  Surgeon: Angelia Mould, MD;  Location: [facility] OR;  Service: Vascular;  Laterality: Left;  Revision Left fem/pop w/ artery patch angioplasty utilizing left GSV graft and extenion to tib/per trunk w/right GSV and interop arteriogram  . JOINT REPLACEMENT  2011   -Hip  . NASAL SINUS SURGERY    . PR VEIN BYPASS GRAFT,AORTO-FEM-POP    . PR VEIN BYPASS GRAFT,AORTO-FEM-POP  08-01-11   (L) Fem-pop BP  . toe nail    . TONSILLECTOMY      There were no vitals filed for this  visit.  Subjective Assessment - 01/22/18 1305    Subjective  Pain less severe. Heel lift helpful.     Currently in Pain?  Yes    Pain Score  2     Pain Location  Back    Pain Orientation  Lower    Pain Descriptors / Indicators  Aching    Aggravating Factors   prolonged standing, prolonged sitting    Pain Relieving Factors  change positions.                       [place] Adult PT Treatment/Exercise - 01/22/18 0001      Lumbar Exercises: Supine   Bridge  10 reps    Bridge with [employer]  10 reps      Lumbar Exercises: Prone   Straight Leg Raise  10 reps    Straight Leg Raises Limitations  alternating with core bracing      Knee/Hip Exercises: Stretches   Active Hamstring Stretch  3 reps;30 seconds    Piriformis Stretch  3 reps;30 seconds IR/ER       Knee/Hip Exercises: Aerobic   Nustep  L5 LE x 5 minutes                PT Short Term Goals - 01/22/18 1306  PT SHORT TERM GOAL #1   Title  He will be independent with initial HEP    Time  2    Period  Weeks    Status  On-going      PT SHORT TERM GOAL #2   Title  He will report leg symptoms decred in frequency or intensith by 50%    Baseline  reduced from 3 x per week to 2 x per week.     Status  On-going        PT Long Term Goals - 01/15/18 1321      PT LONG TERM GOAL #1   Title  He will e independnent in all hEP issued    Time  6    Period  Weeks    Status  New      PT LONG TERM GOAL #2   Title  He will report no leg symptoms    Time  6    Period  Weeks    Status  New      PT LONG TERM GOAL #3   Title  He will report LBP as intermittant and no more than mild    Time  6    Period  Weeks    Status  New      PT LONG TERM GOAL #4   Title  He will be able to walk 1/2 to 3/4 mile with dog with no leg pain    Time  6    Period  Weeks    Status  New      PT LONG TERM GOAL #5   Title  He will be able to ride in car for 60 min without leg symptoms    Time  6    Period  Weeks     Status  New            Plan - 01/22/18 1308    Clinical Impression Statement  Pt reports overall pain decrease with less frequent episodes of leg pain. Sitting causes shooting pain down leg past knee especially driving his Nissan but never in his Mustang. Walking dog causes bilateral hip pain. Discussed sitting posture and use of lumbar support with sitting and driving. Reviewed HEP. He is not having leg pain today. Began prone hip extension exercises. Began piriformis/hamstring stretching, Will assess response and update HEP next visit.     PT Treatment/Interventions  Passive range of motion;Dry needling;Patient/family education;Therapeutic exercise;Iontophoresis 87m/ml Dexamethasone;Moist Heat;Traction;Manual techniques    PT Next Visit Plan  --    Consulted and Agree with Plan of Care  Patient       Patient will benefit from skilled therapeutic intervention in order to improve the following deficits and impairments:  Pain, Postural dysfunction, Increased muscle spasms, Difficulty walking, Decreased activity tolerance  Visit Diagnosis: Abnormal posture  Muscle spasm of back  Acute bilateral low back pain with left-sided sciatica     Problem List Patient Active Problem List   Diagnosis Date Noted  . Rhinitis, allergic 09/25/2017  . Tobacco use disorder 09/11/2017  . COPD (chronic obstructive pulmonary disease) ([place] 09/10/2017  . Hypertension with heart disease 09/04/2017  . Pain in lower limb 03/21/2014  . Diabetes type 2, uncontrolled ([place] 03/21/2014  . PVD (peripheral vascular disease) ([place] 12/11/2013  . Occlusion and stenosis of carotid artery without mention of cerebral infarction 12/11/2013  . Onychomycosis 06/07/2013  . Pain in joint, ankle and foot 06/07/2013  . Callus of foot 06/07/2013  . Peripheral  vascular disease, unspecified ([place]) 05/29/2013  . Aftercare following surgery of the circulatory system, [place] 05/29/2013  . Other postoperative infection  11/13/2012  . Neuropathic pain of lower extremity 11/23/2011  . Atherosclerosis of native arteries of the extremities with intermittent claudication 10/12/2011    Dorene Ar, [place] 01/22/2018, 1:45 PM  [place], [place], [address] Phone: [phone]   Fax:  [phone]  Name: Sean Saunders MRN: [id number] Date of Birth: 30-Mar-1946  PHYSICAL THERAPY DISCHARGE SUMMARY  Visits from Start of Care: 2 Current functional level related to goals / functional outcomes: Unknown as he canceled next appointment due to illness and did not return   Remaining deficits: Unknown   Education / Equipment: HEP  Plan:                                                    Patient goals were not met. Patient is being discharged due to not returning since the last visit.  ?????    Pearson Forster PT 03/26/18

## 2018-01-22 NOTE — Progress Notes (Signed)
ACUTE VISIT  HPI:  Chief Complaint  Patient presents with  . Sinusitis    Dust + mold exposure on Friday. "Everything hurts" in head and ears burning. Body aches and productive cough.    Mr.Cove C Cruzan is a 72 y.o.male here today with his wife  complaining of a days of respiratory symptoms.  He thinks symptoms are caused by sinus infection and attributed to dust/mold exposure about 3 days ago. Yesterday he started with sinus pressure ,subjective fever, chills, and body aches.   History of COPD, currently he is on  Advair 250-50 mcg bid and Combivent inh once daily. + Smoker.   Sinus Problem  This is a new problem. The current episode started yesterday. The pain is moderate. Associated symptoms include chills, congestion, coughing, headaches (frontal pressure headache), sinus pressure and a sore throat. Pertinent negatives include no ear pain, hoarse voice, neck pain, shortness of breath or swollen glands. The treatment provided mild relief.    Productive cough with yellow sputum, denies hemoptysis.Marland Kitchen + Nasal congestion, rhinorrhea, sore throat, and post nasal drainage.  He has not noted chest pain, worsening dyspnea or wheezing.  No Hx of recent travel. + Sick contact,his wife and his pastor were sick 1-2 weeks ago. No known exposure to influenza. No known insect bite.  Hx of allergies: Yes, hx of allergic rhinitis.  OTC medications for this problem: Mucinex.  Symptoms getting worse.  HTN: BP is elevated today. He does not check BP at home. He follows through the [place] and currently he is on HCTZ 25 mg daily, Amlodipine 10 mg daily, and Diovan 320 mg daily.     Review of Systems  Constitutional: Positive for appetite change, chills, fatigue and fever. Negative for activity change.  HENT: Positive for congestion, rhinorrhea, sinus pressure and sore throat. Negative for ear pain, facial swelling, hoarse voice, mouth sores, postnasal drip, trouble swallowing  and voice change.   Eyes: Negative for discharge and redness.  Respiratory: Positive for cough and wheezing. Negative for shortness of breath.   Cardiovascular: Negative for chest pain and leg swelling.  Gastrointestinal: Negative for abdominal pain, diarrhea, nausea and vomiting.  Genitourinary: Negative for decreased urine volume, dysuria and hematuria.  Musculoskeletal: Positive for myalgias. Negative for neck pain.  Skin: Negative for rash.  Allergic/Immunologic: Positive for environmental allergies.  Neurological: Positive for headaches (frontal pressure headache). Negative for syncope, facial asymmetry and weakness.  Hematological: Negative for adenopathy. Does not bruise/bleed easily.  Psychiatric/Behavioral: Positive for sleep disturbance. Negative for confusion. The patient is nervous/anxious.       Current Outpatient Medications on File Prior to Visit  Medication Sig Dispense Refill  . albuterol-ipratropium (COMBIVENT) 18-103 MCG/ACT inhaler Inhale 1 puff into the lungs every morning.    Marland Kitchen amLODipine (NORVASC) 10 MG tablet Take 10 mg by mouth daily.    . Cholecalciferol (VITAMIN D) 2000 UNITS CAPS Take 2,000 Units by mouth daily.    . clopidogrel (PLAVIX) 75 MG tablet Take 75 mg by mouth daily.      . empagliflozin (JARDIANCE) 10 MG TABS tablet Take 10 mg by mouth daily.    . Fluticasone-Salmeterol (ADVAIR DISKUS) 250-50 MCG/DOSE AEPB Inhale 1 puff into the lungs 2 (two) times daily. 60 each 3  . furosemide (LASIX) 20 MG tablet Take 20 mg by mouth daily.     . hydrochlorothiazide (HYDRODIURIL) 25 MG tablet Take 25 mg by mouth 2 (two) times daily.    . insulin regular  human CONCENTRATED (HUMULIN R) 500 UNIT/ML SOLN injection 50 units in the morning, 40 units at lunch, and 60 units at dinner.    . levothyroxine (SYNTHROID, LEVOTHROID) 150 MCG tablet Take 150 mcg by mouth daily before breakfast.    . metFORMIN (GLUCOPHAGE) 500 MG tablet Take 500 mg by mouth 2 (two) times daily with  a meal.    . Multiple Vitamin (MULTIVITAMIN WITH MINERALS) TABS tablet Take 1 tablet by mouth daily.    . saxagliptin HCl (ONGLYZA) 5 MG TABS tablet Take 5 mg by mouth daily.    . sildenafil (REVATIO) 20 MG tablet Take 20 mg by mouth.    . Testosterone 10 MG/ACT (2%) GEL daily.  0  . valsartan (DIOVAN) 320 MG tablet Take 320 mg by mouth daily.    Marland Kitchen ipratropium-albuterol (DUONEB) 0.5-2.5 (3) MG/3ML SOLN Take 3 mLs by nebulization every 8 (eight) hours. 60 mL 0   No current facility-administered medications on file prior to visit.      Past Medical History:  Diagnosis Date  . Arthritis   . Bronchitis    hx of  . COPD (chronic obstructive pulmonary disease) ([place])   . Diabetes mellitus   . DVT (deep venous thrombosis) ([place])   . GERD (gastroesophageal reflux disease)   . Hyperlipidemia   . Hypertension   . Hypothyroidism   . Onychomycosis 06/07/2013  . Stroke [facility])    TIA's 01/05/11-01/08/11-01/26/11  . Thyroid disease   . TIA (transient ischemic attack) 08/03/2011   Left sided weakness. 3 episodes Feb-March  . TIA (transient ischemic attack)    x 3   Allergies  Allergen Reactions  . Eszopiclone Itching  . Niaspan [Niacin Er] Nausea And Vomiting and Rash    Social History   Socioeconomic History  . Marital status: Married    Spouse name: None  . Number of children: None  . Years of education: None  . Highest education level: None  Social Needs  . Financial resource strain: None  . Food insecurity - worry: None  . Food insecurity - inability: None  . Transportation needs - medical: None  . Transportation needs - non-medical: None  Occupational History  . None  Tobacco Use  . Smoking status: Current Every Day Smoker    Packs/day: 0.50    Years: 5.00    Pack years: 2.50    Types: Cigarettes    Last attempt to quit: 08/02/2011    Years since quitting: 6.4  . Smokeless tobacco: Never Used  Substance and Sexual Activity  . Alcohol use: Yes    Alcohol/week: 0.0 oz     Comment: rare  . Drug use: No  . Sexual activity: None  Other Topics Concern  . None  Social History Narrative  . None    Vitals:   01/22/18 1641  BP: (!) 160/84  Pulse: (!) 104  Resp: 20  Temp: 99.5 F (37.5 C)  SpO2: 96%   Body mass index is 29.13 kg/m.   Physical Exam  Nursing note and vitals reviewed. Constitutional: He is oriented to person, place, and time. He appears well-developed and well-nourished. He appears ill. He appears distressed (Mild).  HENT:  Head: Normocephalic and atraumatic.  Right Ear: Tympanic membrane, external ear and ear canal normal.  Left Ear: Tympanic membrane and external ear normal.  Nose: Rhinorrhea present. Right sinus exhibits maxillary sinus tenderness and frontal sinus tenderness. Left sinus exhibits maxillary sinus tenderness and frontal sinus tenderness.  Mouth/Throat: Uvula is midline and  mucous membranes are normal. Posterior oropharyngeal erythema present. No oropharyngeal exudate or posterior oropharyngeal edema.  Excess cerumen left ear canal, TM seen partially.  Nasal voice. Mild tenderness upon pressing sinuses, normal transillumination.  Eyes: Conjunctivae are normal. Pupils are equal, round, and reactive to light.  Cardiovascular: Regular rhythm. Tachycardia present.  No murmur heard. Respiratory: Effort normal and breath sounds normal. No stridor. No respiratory distress. He has no wheezes. He has no rales.  Lymphadenopathy:       Head (right side): No submandibular adenopathy present.       Head (left side): No submandibular adenopathy present.    He has no cervical adenopathy.  Neurological: He is alert and oriented to person, place, and time. He has normal strength. Gait normal.  Skin: Skin is warm. No rash noted. No erythema.  Psychiatric: He has a normal mood and affect.  Well groomed, good eye contact.    ASSESSMENT AND PLAN:   Mr. Olusegun was seen today for sinusitis.  Diagnoses and all orders for this  visit:  Fever, unspecified  Symptomatic treatment with OTC Tylenol 500 mg 3 times per day as needed recommended. Bed rest, plenty of p.o. fluids. Rapid flu negative but because symptoms are flulike +COPD + DM 2, I recommended treatment with Tamiflu.  Viral respiratory illness  Explained that symptoms suggest a viral illness, in which case antibiotic treatment is not effective. Given his history of COPD we need to monitor for signs of complications.  He would like something for the cough, Hycodan recommended, side effects discussed. He was clearly instructed about warning signs.  -     oseltamivir (TAMIFLU) 75 MG capsule; Take 1 capsule (75 mg total) by mouth 2 (two) times daily for 5 days. -     HYDROcodone-homatropine (HYCODAN) 5-1.5 MG/5ML syrup; Take 5 mLs by mouth every 12 (twelve) hours as needed for up to 10 days for cough.  Nasal sinus congestion   I do not think he has a sinus bacterial infection that requires oral antibiotics, educated about adverse effects of antibiotic use without a clear indication.  Symptoms started yesterday, explained that congestion and cough can last a few weeks especially his case due to tobacco use.  Hypertension with heart disease  BP elevated today. No changes in current management for now. Instructed to monitor BP at home. Follow-up in 10 days. Instructed about warning signs.    -Mr. [name] advised to seek attention immediately if symptoms worsen or to follow if they persist or new concerns arise.       Ranier Coach G. Martinique, MD  [facility]. [place] office.

## 2018-01-25 ENCOUNTER — Ambulatory Visit: Payer: Medicare HMO

## 2018-01-26 ENCOUNTER — Ambulatory Visit: Payer: Medicare Other | Admitting: Family Medicine

## 2018-01-26 ENCOUNTER — Ambulatory Visit: Payer: Self-pay

## 2018-01-26 ENCOUNTER — Other Ambulatory Visit: Payer: Self-pay | Admitting: *Deleted

## 2018-01-26 MED ORDER — AMOXICILLIN-POT CLAVULANATE 875-125 MG PO TABS: 1.0000 | ORAL_TABLET | Freq: Two times a day (BID) | ORAL | 0 refills | Status: AC

## 2018-01-26 NOTE — Telephone Encounter (Signed)
Spoke with patient, gave instructions per Dr. Martinique, patient stated that he wanted to try antibiotic. Rx sent to pharmacy.

## 2018-01-26 NOTE — Telephone Encounter (Signed)
Taking Mucinex and finishing up Tamiflu and still feels bad.Using humidifier.  Reason for Disposition . [1] Sinus congestion (pressure, fullness) AND [2] present > 10 days  Answer Assessment - Initial Assessment Questions 1. LOCATION: "Where does it hurt?"      Ears and eyes hurt 2. ONSET: "When did the sinus pain start?"  (e.g., hours, days)      Started 1 week ago 3. SEVERITY: "How bad is the pain?"   (Scale 1-10; mild, moderate or severe)   - MILD (1-3): doesn't interfere with normal activities    - MODERATE (4-7): interferes with normal activities (e.g., work or school) or awakens from sleep   - SEVERE (8-10): excruciating pain and patient unable to do any normal activities        8 4. RECURRENT SYMPTOM: "Have you ever had sinus problems before?" If so, ask: "When was the last time?" and "What happened that time?"      Yes 5. NASAL CONGESTION: "Is the nose blocked?" If so, ask, "Can you open it or must you breathe through the mouth?"     Nose is blocked 6. NASAL DISCHARGE: "Do you have discharge from your nose?" If so ask, "What color?"     Clear 7. FEVER: "Do you have a fever?" If so, ask: "What is it, how was it measured, and when did it start?"      No 8. OTHER SYMPTOMS: "Do you have any other symptoms?" (e.g., sore throat, cough, earache, difficulty breathing)     Cough, ears hurt, rattling in chest 9. PREGNANCY: "Is there any chance you are pregnant?" "When was your last menstrual period?"     No  Protocols used: SINUS PAIN OR CONGESTION-A-AH

## 2018-01-26 NOTE — Telephone Encounter (Signed)
I explained to pt that viral illness are self limited,abx do not help,and given the fact he is a smoker congestion can last weeks. I just saw him a few days ago. If he insists in abx treatment Augmentin 875-125 mg bid for 7 days can be sent to his pharmacy.  If worsening dyspnea, high fever that does not improve with Acetaminophen,or MS changes he needs to seek immediate medical attention.  Thanks, BJ

## 2018-01-31 ENCOUNTER — Encounter: Payer: Self-pay | Admitting: Family Medicine

## 2018-01-31 ENCOUNTER — Ambulatory Visit (INDEPENDENT_AMBULATORY_CARE_PROVIDER_SITE_OTHER): Payer: Medicare HMO | Admitting: Family Medicine

## 2018-01-31 VITALS — BP 126/72 | HR 78 | Temp 98.4°F | Resp 16 | Ht 72.0 in | Wt 214.0 lb

## 2018-01-31 DIAGNOSIS — J441 Chronic obstructive pulmonary disease with (acute) exacerbation: Secondary | ICD-10-CM

## 2018-01-31 DIAGNOSIS — Z794 Long term (current) use of insulin: Secondary | ICD-10-CM | POA: Diagnosis not present

## 2018-01-31 DIAGNOSIS — R112 Nausea with vomiting, unspecified: Secondary | ICD-10-CM

## 2018-01-31 DIAGNOSIS — I119 Hypertensive heart disease without heart failure: Secondary | ICD-10-CM

## 2018-01-31 DIAGNOSIS — N179 Acute kidney failure, unspecified: Secondary | ICD-10-CM | POA: Diagnosis not present

## 2018-01-31 DIAGNOSIS — E1165 Type 2 diabetes mellitus with hyperglycemia: Secondary | ICD-10-CM | POA: Diagnosis not present

## 2018-01-31 LAB — BASIC METABOLIC PANEL
BUN: 60 mg/dL — ABNORMAL HIGH (ref 6–23)
CO2: 28 mEq/L (ref 19–32)
Calcium: 10.2 mg/dL (ref 8.4–10.5)
Chloride: 99 mEq/L (ref 96–112)
Creatinine, Ser: 2.57 mg/dL — ABNORMAL HIGH (ref 0.40–1.50)
GFR: 26.22 mL/min — ABNORMAL LOW (ref >60.00)
Glucose, Bld: 175 mg/dL — ABNORMAL HIGH (ref 70–99)
Potassium: 5.7 mEq/L — ABNORMAL HIGH (ref 3.5–5.1)
Sodium: 135 mEq/L (ref 135–145)

## 2018-01-31 LAB — CBC WITH DIFFERENTIAL/PLATELET
Basophils Absolute: 0 10*3/uL (ref 0.0–0.1)
Basophils Relative: 0.5 % (ref 0.0–3.0)
Eosinophils Absolute: 0.4 10*3/uL (ref 0.0–0.7)
Eosinophils Relative: 3.4 % (ref 0.0–5.0)
HCT: 47.5 % (ref 39.0–52.0)
Hemoglobin: 15.6 g/dL (ref 13.0–17.0)
Lymphocytes Relative: 30.2 % (ref 12.0–46.0)
Lymphs Abs: 3.2 10*3/uL (ref 0.7–4.0)
MCHC: 32.8 g/dL (ref 30.0–36.0)
MCV: 90.2 fl (ref 78.0–100.0)
Monocytes Absolute: 1 10*3/uL (ref 0.1–1.0)
Monocytes Relative: 9.4 % (ref 3.0–12.0)
Neutro Abs: 6.1 10*3/uL (ref 1.4–7.7)
Neutrophils Relative %: 56.5 % (ref 43.0–77.0)
Platelets: 231 10*3/uL (ref 150.0–400.0)
RBC: 5.27 Mil/uL (ref 4.22–5.81)
RDW: 14.5 % (ref 11.5–15.5)
WBC: 10.7 10*3/uL — ABNORMAL HIGH (ref 4.0–10.5)

## 2018-01-31 MED ORDER — PANTOPRAZOLE SODIUM 20 MG PO TBEC: 20.0000 mg | DELAYED_RELEASE_TABLET | Freq: Two times a day (BID) | ORAL | 0 refills | Status: DC

## 2018-01-31 MED ORDER — PREDNISONE 20 MG PO TABS: ORAL_TABLET | ORAL | 0 refills | Status: AC

## 2018-01-31 MED ORDER — ONDANSETRON HCL 4 MG PO TABS: 4.0000 mg | ORAL_TABLET | Freq: Three times a day (TID) | ORAL | 0 refills | Status: AC | PRN

## 2018-01-31 NOTE — Patient Instructions (Signed)
A few things to remember from today's visit:   Nausea and vomiting in adult - Plan: Basic metabolic panel, CBC with Differential/Platelet  Chronic obstructive pulmonary disease with acute exacerbation ([place]) - Plan: CBC with Differential/Platelet, predniSONE (DELTASONE) 20 MG tablet  Avoid resuming smoking. Plain Mucinex daily. Duoneb neb 3-4 times daily for 7 days then as needed.  Prednisone taper started again. Monitor blood sugars closely.   Omeprazole for 3-4 weeks. Monitor for diarrhea or abdominal pain.  Please be sure medication list is accurate. If a new problem present, please set up appointment sooner than planned today.

## 2018-01-31 NOTE — Progress Notes (Signed)
HPI:   [name] is a 72 y.o. male, who is here today to follow on recent OV.   He was seen on 01/22/18, when he was seen because of fever and respiratory symptoms.  Rapid flu test was negative but given the fact he was possibly exposed to influenza, he was started on Tamiflu. He called a few days ago, reporting no improvement of symptoms and requesting abx. So Augmentin was started, 01/26/18. He is reporting improvement of sinus pressure and nasal congestion. Wheezing and cough have also improved. His exertional dyspnea is back to his baseline and denies chest pain.  COPD: He has not smoked since illness started. He is on DuoNeb twice daily. Symbicort 160-4.5 mcg twice daily.  2 days ago he started with nausea and vomiting, usually late at night x 1 vomiting,food content. He has not identified exacerbating or alleviating factors. He denies abdominal pain or diarrhea. Occasionally he has some heartburn. He tried OTC Tums, which has not helped.  Is no longer having fever, chills, or body aches.  DM 2:  BS's at his baseline , in the mid 200s. Currently he is on Humulin R, Onglyza 5 mg daily, Metformin 500 mg twice daily,and Jardiance 10 mg daily.  [facility]N: [facility]. He is not checking BP's at home. Currently he is on Diovan 320 mg daily, Amlodipine 10 mg daily, and hydrochlorothiazide 25 mg daily.    Review of Systems  Constitutional: Positive for fatigue. Negative for chills and fever.  HENT: Positive for congestion, postnasal drip, rhinorrhea and sinus pressure. Negative for facial swelling, mouth sores, sore throat and trouble swallowing.   Eyes: Negative for discharge, redness and itching.  Respiratory: Positive for cough, shortness of breath and wheezing.   Cardiovascular: Negative for chest pain.  Gastrointestinal: Positive for nausea and vomiting. Negative for abdominal pain and diarrhea.  Endocrine: Negative for polydipsia,  polyphagia and polyuria.  Genitourinary: Negative for decreased urine volume and hematuria.  Musculoskeletal: Negative for gait problem and myalgias.  Neurological: Negative for syncope, weakness and headaches.  Hematological: Negative for adenopathy. Does not bruise/bleed easily.  Psychiatric/Behavioral: Negative for confusion. The patient is nervous/anxious.       Current Outpatient Medications on File Prior to Visit  Medication Sig Dispense Refill  . albuterol-ipratropium (COMBIVENT) 18-103 MCG/ACT inhaler Inhale 1 puff into the lungs every morning.    Marland Kitchen amLODipine (NORVASC) 10 MG tablet Take 10 mg by mouth daily.    Marland Kitchen amoxicillin-clavulanate (AUGMENTIN) 875-125 MG tablet Take 1 tablet by mouth 2 (two) times daily for 7 days. 14 tablet 0  . Cholecalciferol (VITAMIN D) 2000 UNITS CAPS Take 2,000 Units by mouth daily.    . clopidogrel (PLAVIX) 75 MG tablet Take 75 mg by mouth daily.      . empagliflozin (JARDIANCE) 10 MG TABS tablet Take 10 mg by mouth daily.    . Fluticasone-Salmeterol (ADVAIR DISKUS) 250-50 MCG/DOSE AEPB Inhale 1 puff into the lungs 2 (two) times daily. 60 each 3  . furosemide (LASIX) 20 MG tablet Take 20 mg by mouth daily.     . hydrochlorothiazide (HYDRODIURIL) 25 MG tablet Take 25 mg by mouth 2 (two) times daily.    Marland Kitchen HYDROcodone-homatropine (HYCODAN) 5-1.5 MG/5ML syrup Take 5 mLs by mouth every 12 (twelve) hours as needed for up to 10 days for cough. 120 mL 0  . insulin regular human CONCENTRATED (HUMULIN R) 500 UNIT/ML SOLN injection 50 units in the  morning, 40 units at lunch, and 60 units at dinner.    . levothyroxine (SYNTHROID, LEVOTHROID) 150 MCG tablet Take 150 mcg by mouth daily before breakfast.    . metFORMIN (GLUCOPHAGE) 500 MG tablet Take 500 mg by mouth 2 (two) times daily with a meal.    . Multiple Vitamin (MULTIVITAMIN WITH MINERALS) TABS tablet Take 1 tablet by mouth daily.    . saxagliptin HCl (ONGLYZA) 5 MG TABS tablet Take 5 mg by mouth daily.    .  sildenafil (REVATIO) 20 MG tablet Take 20 mg by mouth.    . Testosterone 10 MG/ACT (2%) GEL daily.  0  . valsartan (DIOVAN) 320 MG tablet Take 320 mg by mouth daily.    Marland Kitchen ipratropium-albuterol (DUONEB) 0.5-2.5 (3) MG/3ML SOLN Take 3 mLs by nebulization every 8 (eight) hours. 60 mL 0   No current facility-administered medications on file prior to visit.      Past Medical History:  Diagnosis Date  . Arthritis   . Bronchitis    hx of  . COPD (chronic obstructive pulmonary disease) ([place])   . Diabetes mellitus   . DVT (deep venous thrombosis) ([place])   . GERD (gastroesophageal reflux disease)   . Hyperlipidemia   . Hypertension   . Hypothyroidism   . Onychomycosis 06/07/2013  . Stroke [facility])    TIA's 01/05/11-01/08/11-01/26/11  . Thyroid disease   . TIA (transient ischemic attack) 08/03/2011   Left sided weakness. 3 episodes Feb-March  . TIA (transient ischemic attack)    x 3   Allergies  Allergen Reactions  . Eszopiclone Itching  . Niaspan [Niacin Er] Nausea And Vomiting and Rash    Social History   Socioeconomic History  . Marital status: Married    Spouse name: None  . Number of children: None  . Years of education: None  . Highest education level: None  Social Needs  . Financial resource strain: None  . Food insecurity - worry: None  . Food insecurity - inability: None  . Transportation needs - medical: None  . Transportation needs - non-medical: None  Occupational History  . None  Tobacco Use  . Smoking status: Current Every Day Smoker    Packs/day: 0.50    Years: 5.00    Pack years: 2.50    Types: Cigarettes    Last attempt to quit: 08/02/2011    Years since quitting: 6.5  . Smokeless tobacco: Never Used  Substance and Sexual Activity  . Alcohol use: Yes    Alcohol/week: 0.0 oz    Comment: rare  . Drug use: No  . Sexual activity: None  Other Topics Concern  . None  Social History Narrative  . None    Vitals:   01/31/18 0930  BP: 126/72  Pulse: 78    Resp: 16  Temp: 98.4 F (36.9 C)  SpO2: 95%   Body mass index is 29.02 kg/m.   Physical Exam  Nursing note and vitals reviewed. Constitutional: He is oriented to person, place, and time. He appears well-developed. No distress.  HENT:  Head: Normocephalic and atraumatic.  Nose: Rhinorrhea present. Right sinus exhibits no maxillary sinus tenderness and no frontal sinus tenderness. Left sinus exhibits no maxillary sinus tenderness and no frontal sinus tenderness.  Mouth/Throat: Oropharynx is clear and moist and mucous membranes are normal.  Postnasal drainage.  Eyes: Conjunctivae are normal. Pupils are equal, round, and reactive to light.  Cardiovascular: Normal rate and regular rhythm.  No murmur heard. Respiratory: Effort normal.  No respiratory distress. He has wheezes. He has no rales.  Course sounds that clear with cough,diffused bilateral.  GI: Soft. Bowel sounds are normal. He exhibits no mass. There is no hepatomegaly. There is no tenderness.  Musculoskeletal: He exhibits no edema or tenderness.  Lymphadenopathy:    He has no cervical adenopathy.  Neurological: He is alert and oriented to person, place, and time. He has normal strength.  Skin: Skin is warm. No erythema.  Psychiatric: He has a normal mood and affect. Cognition and memory are normal.  Well groomed, good eye contact.    ASSESSMENT AND PLAN:  Mr. Tabari was seen today for follow-up.  Orders Placed This Encounter  Procedures  . Basic metabolic panel  . CBC with Differential/Platelet     Lab Results  Component Value Date   CREATININE 2.57 (H) 01/31/2018   BUN 60 (H) 01/31/2018   NA 135 01/31/2018   K 5.7 (H) 01/31/2018   CL 99 01/31/2018   CO2 28 01/31/2018   Lab Results  Component Value Date   WBC 10.7 (H) 01/31/2018   HGB 15.6 01/31/2018   HCT 47.5 01/31/2018   MCV 90.2 01/31/2018   PLT 231.0 01/31/2018    Nausea and vomiting in adult  We discussed possible etiologies, including side  effects of some of his medications (Augmentin) as well as electrolytes imbalance. ?  Dyspepsia/gastritis/GERD, he agrees with trying Prilosec 20 mg daily for 3-4 weeks. Oral hydration, small sips at the time. Zofran for symptomatic treatment. Instructed about warning signs. Further recommendation will be given according to lab results.  -     Basic metabolic panel -     CBC with Differential/Platelet -     ondansetron (ZOFRAN) 4 MG tablet; Take 1 tablet (4 mg total) by mouth every 8 (eight) hours as needed for up to 5 days for nausea or vomiting. -     pantoprazole (PROTONIX) 20 MG tablet; Take 1 tablet (20 mg total) by mouth 2 (two) times daily before a meal.  Chronic obstructive pulmonary disease with acute exacerbation (H[facility])  Reporting improvement and dyspnea back to his baseline. Strongly recommend not to resume smoking. On auscultation he still has diffuse wheezing, so prednisone taper recommended. DuoNeb 3-4 times per day for 7 days then back to twice daily. No changes in Symbicort. Instructed about warning signs. Follow-up in 6-8 weeks.  -     CBC with Differential/Platelet -     predniSONE (DELTASONE) 20 MG tablet; 3 tabs for 3 days, 2 tabs for 3 days, 1 tabs for 3 days, and 1/2 tab for 3 days. Take tables together with breakfast.  Uncontrolled type 2 diabetes mellitus with hyperglycemia (HCC)  We discussed side effects of Prednisone, recommend monitoring glucose closely. Humulin R can be adjusted if needed. No changes in rest of his medications for now.  Hypertension with heart disease  Better controlled. With no changes in current management. Continue following at the [place].   Addendum:  Labs back: AKI, I do not have B recent BMP for comparison but no known Hx of CKD. Will recommend holding on Metformin and Valsartan for now. Continue Monitoring BP. Adequate hydration with clear fluids. For now no changes in rest of BP meds. If BS's still in 200's-300's Lantus or  Toujeo can be added. Repeat lab in 1-2 days.     Recie Cirrincione G. Martinique, MD  [facility]. [place] office.

## 2018-02-01 ENCOUNTER — Telehealth: Payer: Self-pay | Admitting: *Deleted

## 2018-02-01 NOTE — Telephone Encounter (Signed)
Copied from [place] [facility] [id number]. Topic: Quick Communication - Lab Results >> Jan 31, 2018  4:18 PM Arletha Grippe wrote: Wife called- she wants a call from provider about labs - she wants a call sooner rather than later and would like further explanation cb is [phone]

## 2018-02-02 ENCOUNTER — Other Ambulatory Visit (INDEPENDENT_AMBULATORY_CARE_PROVIDER_SITE_OTHER): Payer: Medicare HMO

## 2018-02-02 DIAGNOSIS — N179 Acute kidney failure, unspecified: Secondary | ICD-10-CM

## 2018-02-02 LAB — BASIC METABOLIC PANEL
BUN: 82 mg/dL — ABNORMAL HIGH (ref 6–23)
CO2: 23 mEq/L (ref 19–32)
Calcium: 10 mg/dL (ref 8.4–10.5)
Chloride: 104 mEq/L (ref 96–112)
Creatinine, Ser: 2.15 mg/dL — ABNORMAL HIGH (ref 0.40–1.50)
GFR: 32.21 mL/min — ABNORMAL LOW (ref >60.00)
Glucose, Bld: 120 mg/dL — ABNORMAL HIGH (ref 70–99)
Potassium: 4.6 mEq/L (ref 3.5–5.1)
Sodium: 141 mEq/L (ref 135–145)

## 2018-02-02 NOTE — Telephone Encounter (Signed)
Lab was already reviewed and instructions given. Hyperkalemia can be dangerous and lab needs to be re-checked. Renal function is abnormal ,no other lab done recently that I can see ([facility]),so I am not sure if this is his baseline. If he does not want to follow recommendations and would like to come Monday to discuss lab ,it is fine to do so. But I would like BMP repeated today.  Thanks, BJ

## 2018-02-04 ENCOUNTER — Encounter: Payer: Self-pay | Admitting: Family Medicine

## 2018-02-05 NOTE — Telephone Encounter (Signed)
Patient informed of all lab results and verbalized understanding.

## 2018-02-06 ENCOUNTER — Telehealth: Payer: Self-pay | Admitting: Family Medicine

## 2018-02-06 NOTE — Telephone Encounter (Signed)
Handicap plate application to be filled out.  Upon completion call patient at [phone]

## 2018-02-07 ENCOUNTER — Ambulatory Visit (INDEPENDENT_AMBULATORY_CARE_PROVIDER_SITE_OTHER): Payer: Medicare HMO | Admitting: Orthopaedic Surgery

## 2018-02-07 NOTE — Telephone Encounter (Signed)
Form placed on Dr. Jordan's desk for completion. 

## 2018-02-09 ENCOUNTER — Encounter (INDEPENDENT_AMBULATORY_CARE_PROVIDER_SITE_OTHER): Payer: Self-pay | Admitting: Orthopaedic Surgery

## 2018-02-09 ENCOUNTER — Ambulatory Visit (INDEPENDENT_AMBULATORY_CARE_PROVIDER_SITE_OTHER): Payer: Medicare HMO | Admitting: Orthopaedic Surgery

## 2018-02-09 VITALS — BP 181/83 | HR 87

## 2018-02-09 DIAGNOSIS — M545 Low back pain, unspecified: Secondary | ICD-10-CM

## 2018-02-09 NOTE — Telephone Encounter (Signed)
Patient picked up form   No charge  

## 2018-02-09 NOTE — Telephone Encounter (Signed)
Patient informed that form is ready for pickup at the front desk.

## 2018-02-09 NOTE — Progress Notes (Signed)
Office Visit Note   Patient: Sean Saunders           Date of Birth: 1946/04/11           MRN: [id number] Visit Date: 02/09/2018              Requested by: Martinique, Betty G, MD [address] [place], [place] [address] PCP: Martinique, Betty G, MD   Assessment & Plan: Visit Diagnoses:  1. Low back pain without sciatica, unspecified back pain laterality, unspecified chronicity     Plan: Patient is gotten improvement with therapy.  We discussed avoiding anti-inflammatory medications with his elevated creatinine and discussed potential for kidney damage with anti-inflammatories.  He can use Tylenol.  Continue exercise program.  Follow-up PRN.  Follow-Up Instructions: Return if symptoms worsen or fail to improve.   Orders:  No orders of the defined types were placed in this encounter.  No orders of the defined types were placed in this encounter.     Procedures: No procedures performed   Clinical Data: No additional findings.   Subjective: Chief Complaint  Patient presents with  . Lower Back - Pain    HPI 72 year old male returns with some ongoing pain in his lower back.  He has had a few physical therapy visits and states it helps but he still has some aching pain in his back at the sacroiliac joint region right and left with cold weather.  When it is warmer he states he has less pain.  Previous radiographs showed some L4-5 disc space narrowing with facet degenerative changes without listhesis.  He denies bowel bladder symptoms no fever or chills.  He is ambulatory without problems.  Review of Systems updated and unchanged from 01/03/2018 office visit.  Of note his creatinine elevation, chronic smoking history and significant PAD.   Objective: Vital Signs: BP (!) 181/83   Pulse 87   Physical Exam  Constitutional: He is oriented to person, place, and time. He appears well-developed and well-nourished.  HENT:  Head: Normocephalic and atraumatic.  Eyes: EOM are  normal. Pupils are equal, round, and reactive to light.  Neck: No tracheal deviation present. No thyromegaly present.  Cardiovascular: Normal rate.  Pulmonary/Chest: Effort normal. He has no wheezes.  Abdominal: Soft. Bowel sounds are normal.  Neurological: He is alert and oriented to person, place, and time.  Skin: Skin is warm and dry. Capillary refill takes less than 2 seconds.  Psychiatric: He has a normal mood and affect. His behavior is normal. Judgment and thought content normal.    Ortho Exam patient has normal heel toe gait anterior tib EHL is intact negative straight leg raising 90 degrees.  Some tenderness over SI joint both right and left.  No trochanteric bursal tenderness.  Previous left femoropopliteal bypass.  Right total hip arthroplasty without pain with hip range of motion.  Specialty Comments:  No specialty comments available.  Imaging: No results found.   PMFS History: Patient Active Problem List   Diagnosis Date Noted  . Rhinitis, allergic 09/25/2017  . Tobacco use disorder 09/11/2017  . COPD (chronic obstructive pulmonary disease) ([place]) 09/10/2017  . Hypertension with heart disease 09/04/2017  . Pain in lower limb 03/21/2014  . Diabetes type 2, uncontrolled ([place]) 03/21/2014  . PVD (peripheral vascular disease) ([place]) 12/11/2013  . Occlusion and stenosis of carotid artery without mention of cerebral infarction 12/11/2013  . Onychomycosis 06/07/2013  . Pain in joint, ankle and foot 06/07/2013  . Callus of foot 06/07/2013  .  Peripheral vascular disease, unspecified ([place]) 05/29/2013  . Aftercare following surgery of the circulatory system, [place] 05/29/2013  . Other postoperative infection 11/13/2012  . Neuropathic pain of lower extremity 11/23/2011  . Atherosclerosis of native arteries of the extremities with intermittent claudication 10/12/2011   Past Medical History:  Diagnosis Date  . Arthritis   . Bronchitis    hx of  . COPD (chronic obstructive  pulmonary disease) ([place])   . Diabetes mellitus   . DVT (deep venous thrombosis) ([place])   . GERD (gastroesophageal reflux disease)   . Hyperlipidemia   . Hypertension   . Hypothyroidism   . Onychomycosis 06/07/2013  . Stroke [facility])    TIA's 01/05/11-01/08/11-01/26/11  . Thyroid disease   . TIA (transient ischemic attack) 08/03/2011   Left sided weakness. 3 episodes Feb-March  . TIA (transient ischemic attack)    x 3    Family History  Problem Relation Age of Onset  . Stroke Mother   . Diabetes Mother   . Other Father        alzheimers  . Diabetes Father   . Stroke Father   . Diabetes Sister     Past Surgical History:  Procedure Laterality Date  . ABDOMINAL AORTAGRAM N/A 06/04/2012   Procedure: ABDOMINAL Maxcine Ham;  Surgeon: Angelia Mould, MD;  Location: [facility] CATH LAB;  Service: Cardiovascular;  Laterality: N/A;  . APPENDECTOMY    . COLONOSCOPY    . FEMORAL-POPLITEAL BYPASS GRAFT  07/05/2012   Procedure: BYPASS GRAFT FEMORAL-POPLITEAL ARTERY;  Surgeon: Angelia Mould, MD;  Location: [facility] OR;  Service: Vascular;  Laterality: Left;  Revision Left fem/pop w/ artery patch angioplasty utilizing left GSV graft and extenion to tib/per trunk w/right GSV and interop arteriogram  . JOINT REPLACEMENT  2011   -Hip  . NASAL SINUS SURGERY    . PR VEIN BYPASS GRAFT,AORTO-FEM-POP    . PR VEIN BYPASS GRAFT,AORTO-FEM-POP  08-01-11   (L) Fem-pop BP  . toe nail    . TONSILLECTOMY     Social History   Occupational History  . Not on file  Tobacco Use  . Smoking status: Current Every Day Smoker    Packs/day: 0.50    Years: 5.00    Pack years: 2.50    Types: Cigarettes    Last attempt to quit: 08/02/2011    Years since quitting: 6.5  . Smokeless tobacco: Never Used  Substance and Sexual Activity  . Alcohol use: Yes    Alcohol/week: 0.0 oz    Comment: rare  . Drug use: No  . Sexual activity: Not on file

## 2018-02-28 ENCOUNTER — Ambulatory Visit: Payer: Medicare HMO | Admitting: Family Medicine

## 2018-03-05 NOTE — Progress Notes (Signed)
HPI:   [name] is a 72 y.o. male, who is here today for follow up.   He was last seen on 01/31/2018 for acute visit.  Since his last OV he has seen Dr. Lorin Saunders, orthopedist.  Abnormal renal function test, his PCP is through the [place], he is not sure about  history of CKD.  Metformin was discontinued. DM 2, he follows through the [place], management was adjusted. 11/2017 HgA1C 9.3.  He also has history of PVD and hypertension, he follows with Dr. Scot Saunders, vascular. He is on Amlodipine 10 mg daily,Diovan 320 mg,and HCTZ 25 mg daily.   Lab Results  Component Value Date   CREATININE 2.15 (H) 02/02/2018   BUN 82 (H) 02/02/2018   NA 141 02/02/2018   K 4.6 02/02/2018   CL 104 02/02/2018   CO2 23 02/02/2018   + Smoker.  He brings copies of prior BMP done at the [place].  02/20/18 Cr 1.56 and e GFR 44 K+ 4.4  11/14/17 Cr 1.59, e GFR 43, and K+ 5.1.   COPD:  According to pt,he was recently seen at the [facility] and currently he is on abx (?) and Prednisone. He has been on abx x 4 since 12/2017.  He quit smoking a week ago. He is on Duoneb prn and Symbicort.  + Wheezing ,productive cough with yellowish/clear sputum,and exertional dyspnea. No fever or chills.    Review of Systems  Constitutional: Positive for fatigue. Negative for activity change, appetite change, chills and fever.  HENT: Positive for congestion and rhinorrhea. Negative for mouth sores, postnasal drip, sore throat and voice change.   Eyes: Negative for discharge, redness and itching.  Respiratory: Positive for cough, shortness of breath and wheezing. Negative for chest tightness.   Cardiovascular: Positive for leg swelling (stable). Negative for chest pain and palpitations.  Gastrointestinal: Negative for abdominal pain, diarrhea, nausea and vomiting.  Endocrine: Negative for polydipsia, polyphagia and polyuria.  Genitourinary: Negative for decreased urine volume and hematuria.  Musculoskeletal: Negative for  back pain, joint swelling and neck pain.  Skin: Negative for rash.  Neurological: Negative for syncope, weakness and headaches.  Psychiatric/Behavioral: Negative for confusion. The patient is nervous/anxious.     Dorothy Spark of 2 to 9 systems]   Current Outpatient Medications on File Prior to Visit  Medication Sig Dispense Refill  . albuterol-ipratropium (COMBIVENT) 18-103 MCG/ACT inhaler Inhale 1 puff into the lungs every morning.    Marland Kitchen amLODipine (NORVASC) 10 MG tablet Take 10 mg by mouth daily.    . Cholecalciferol (VITAMIN D) 2000 UNITS CAPS Take 2,000 Units by mouth daily.    . clopidogrel (PLAVIX) 75 MG tablet Take 75 mg by mouth daily.      . Fluticasone-Salmeterol (ADVAIR DISKUS) 250-50 MCG/DOSE AEPB Inhale 1 puff into the lungs 2 (two) times daily. 60 each 3  . furosemide (LASIX) 20 MG tablet Take 20 mg by mouth daily.     . hydrochlorothiazide (HYDRODIURIL) 25 MG tablet Take 25 mg by mouth 2 (two) times daily.    . insulin regular human CONCENTRATED (HUMULIN R) 500 UNIT/ML SOLN injection 50 units in the morning, 40 units at lunch, and 60 units at dinner.    . levothyroxine (SYNTHROID, LEVOTHROID) 150 MCG tablet Take 150 mcg by mouth daily before breakfast.    . Multiple Vitamin (MULTIVITAMIN WITH MINERALS) TABS tablet Take 1 tablet by mouth daily.    . saxagliptin HCl (ONGLYZA) 5 MG TABS tablet Take 5 mg by  mouth daily.    . sildenafil (REVATIO) 20 MG tablet Take 20 mg by mouth.    . Testosterone 10 MG/ACT (2%) GEL daily.  0  . valsartan (DIOVAN) 320 MG tablet Take 320 mg by mouth daily.    Marland Kitchen ipratropium-albuterol (DUONEB) 0.5-2.5 (3) MG/3ML SOLN Take 3 mLs by nebulization every 8 (eight) hours. 60 mL 0  . pantoprazole (PROTONIX) 20 MG tablet Take 1 tablet (20 mg total) by mouth 2 (two) times daily before a meal. 60 tablet 0   No current facility-administered medications on file prior to visit.      Past Medical History:  Diagnosis Date  . Arthritis   . Bronchitis    hx of  .  COPD (chronic obstructive pulmonary disease) ([place])   . Diabetes mellitus   . DVT (deep venous thrombosis) ([place])   . GERD (gastroesophageal reflux disease)   . Hyperlipidemia   . Hypertension   . Hypothyroidism   . Onychomycosis 06/07/2013  . Stroke [facility])    TIA's 01/05/11-01/08/11-01/26/11  . Thyroid disease   . TIA (transient ischemic attack) 08/03/2011   Left sided weakness. 3 episodes Feb-March  . TIA (transient ischemic attack)    x 3   Allergies  Allergen Reactions  . Eszopiclone Itching  . Niaspan [Niacin Er] Nausea And Vomiting and Rash    Social History   Socioeconomic History  . Marital status: Married    Spouse name: Not on file  . Number of children: Not on file  . Years of education: Not on file  . Highest education level: Not on file  Occupational History  . Not on file  Social Needs  . Financial resource strain: Not on file  . Food insecurity:    Worry: Not on file    Inability: Not on file  . Transportation needs:    Medical: Not on file    Non-medical: Not on file  Tobacco Use  . Smoking status: Current Every Day Smoker    Packs/day: 0.50    Years: 5.00    Pack years: 2.50    Types: Cigarettes    Last attempt to quit: 08/02/2011    Years since quitting: 6.6  . Smokeless tobacco: Never Used  Substance and Sexual Activity  . Alcohol use: Yes    Alcohol/week: 0.0 oz    Comment: rare  . Drug use: No  . Sexual activity: Not on file  Lifestyle  . Physical activity:    Days per week: Not on file    Minutes per session: Not on file  . Stress: Not on file  Relationships  . Social connections:    Talks on phone: Not on file    Gets together: Not on file    Attends religious service: Not on file    Active member of club or organization: Not on file    Attends meetings of clubs or organizations: Not on file    Relationship status: Not on file  Other Topics Concern  . Not on file  Social History Narrative  . Not on file    Vitals:   03/06/18  0824  BP: 132/86  Pulse: 73  Resp: 16  Temp: 98.4 F (36.9 C)  SpO2: 96%   Body mass index is 29.99 kg/m.   Physical Exam  Nursing note and vitals reviewed. Constitutional: He is oriented to person, place, and time. He appears well-developed. No distress.  HENT:  Head: Normocephalic and atraumatic.  Mouth/Throat: Oropharynx is clear and moist  and mucous membranes are normal.  Eyes: Pupils are equal, round, and reactive to light. Conjunctivae are normal.  Cardiovascular: Normal rate and regular rhythm.  No murmur heard. Respiratory: Effort normal. No respiratory distress. He has wheezes. He has no rales.  GI: Soft. He exhibits no mass. There is no hepatomegaly. There is no tenderness.  Musculoskeletal: He exhibits edema (Pitting LE edema, bilateral).  Lymphadenopathy:    He has no cervical adenopathy.  Neurological: He is alert and oriented to person, place, and time. He has normal strength. Gait normal.  Skin: Skin is warm. No rash noted. No erythema.  Psychiatric: He has a normal mood and affect. Cognition and memory are normal.  Well groomed, good eye contact.      ASSESSMENT AND PLAN:   [name] was seen today for follow-up.  Orders Placed This Encounter  Procedures  . Ambulatory referral to Pulmonology    1. Uncontrolled type 2 diabetes mellitus with hyperglycemia (HCC)  Poorly controlled. Educated about possible complications of poorly controlled DM. Continue following with endocrinologist at the [place].   2. Hypertension with heart disease  Well controlled. No changes in current management. He is following with provider at the [place].  3. CKD (chronic kidney disease), stage III ([place])  Educated about Dx. Cr baseline 1.5 He takes Ibuprofen daily for arthralgias, instructed to avoid NSAID's, follow low salt diet, and adequate hydration. Adequate HTN and DM control. Continue Diovan.   4. Chronic obstructive pulmonary disease with acute  exacerbation (HCC)  Not well controlled. Side effects of abx discussed. Duoneb tid, no changes in Symbicort. Instructed about warning signs.  Encouraged to continue tobacco free.   - Ambulatory referral to Pulmonology    -[name] was advised to return sooner than planned today if new concerns arise.       Hadyn Blanck G. Martinique, MD  [facility]. [place] office.

## 2018-03-06 ENCOUNTER — Encounter: Payer: Self-pay | Admitting: Family Medicine

## 2018-03-06 ENCOUNTER — Ambulatory Visit (INDEPENDENT_AMBULATORY_CARE_PROVIDER_SITE_OTHER): Payer: Medicare HMO | Admitting: Family Medicine

## 2018-03-06 VITALS — BP 132/86 | HR 73 | Temp 98.4°F | Resp 16 | Ht 72.0 in | Wt 221.1 lb

## 2018-03-06 DIAGNOSIS — N183 Chronic kidney disease, stage 3 unspecified: Secondary | ICD-10-CM

## 2018-03-06 DIAGNOSIS — E1165 Type 2 diabetes mellitus with hyperglycemia: Secondary | ICD-10-CM

## 2018-03-06 DIAGNOSIS — J441 Chronic obstructive pulmonary disease with (acute) exacerbation: Secondary | ICD-10-CM | POA: Diagnosis not present

## 2018-03-06 DIAGNOSIS — Z794 Long term (current) use of insulin: Secondary | ICD-10-CM | POA: Diagnosis not present

## 2018-03-06 DIAGNOSIS — I119 Hypertensive heart disease without heart failure: Secondary | ICD-10-CM | POA: Diagnosis not present

## 2018-03-06 DIAGNOSIS — I739 Peripheral vascular disease, unspecified: Secondary | ICD-10-CM | POA: Diagnosis not present

## 2018-03-06 DIAGNOSIS — E1122 Type 2 diabetes mellitus with diabetic chronic kidney disease: Secondary | ICD-10-CM | POA: Diagnosis not present

## 2018-03-06 NOTE — Patient Instructions (Addendum)
A few things to remember from today's visit:   Uncontrolled type 2 diabetes mellitus with hyperglycemia ([place])  Hypertension with heart disease  Abnormal renal function test  Chronic obstructive pulmonary disease with acute exacerbation ([place]) - Plan: Ambulatory referral to Pulmonology  Duoneb 3 times per day.  No changes in Symbicort. Plain Mucinex.  Pulmonologist appt will be arranged. To preserve kidney function and/or slow progression of disease:  Low salt diet and adequate hydration. Avoiding medicines known as "nonsteroidal anti-inflammatory drugs," or NSAIDs. These medicines include ibuprofen (sample brand names: Advil, Motrin) and naproxen (sample brand name: Aleve).  Adequate blood pressure control. Low phosphorus diet.   Please be sure medication list is accurate. If a new problem present, please set up appointment sooner than planned today.

## 2018-03-14 ENCOUNTER — Ambulatory Visit (INDEPENDENT_AMBULATORY_CARE_PROVIDER_SITE_OTHER): Payer: Medicare HMO | Admitting: Podiatry

## 2018-03-14 ENCOUNTER — Encounter: Payer: Self-pay | Admitting: Podiatry

## 2018-03-14 DIAGNOSIS — I739 Peripheral vascular disease, unspecified: Secondary | ICD-10-CM | POA: Diagnosis not present

## 2018-03-14 DIAGNOSIS — B351 Tinea unguium: Secondary | ICD-10-CM

## 2018-03-14 NOTE — Progress Notes (Signed)
  Subjective: 72 y.o. year old male patient presents complaining of painful nails. Patient requests toe nails trimmed.  Blood glucose was down on 02/02/18 to 120 from 175 on 01/31/18. He is gaining weight and going back to GYM.  Objective: Dermatologic: Thick yellow deformed nails x 10. Ingrown hallucal nails on both great toes. Vascular: Pedal pulses are not palpable. Orthopedic: Contracted lesser digits  Neurologic: All epicritic and tactile sensations grossly intact.  Assessment: Dystrophic mycotic nails x 10. Ingrown nail bilateral hallux.  Treatment: All mycotic nails debrided.  Return in 3 months or as needed.

## 2018-03-14 NOTE — Patient Instructions (Signed)
Seen for hypertrophic nails. All nails debrided. Return in 3 months or as needed.  

## 2018-04-09 ENCOUNTER — Ambulatory Visit (INDEPENDENT_AMBULATORY_CARE_PROVIDER_SITE_OTHER): Payer: Medicare HMO | Admitting: Emergency Medicine

## 2018-04-09 ENCOUNTER — Encounter: Payer: Self-pay | Admitting: Emergency Medicine

## 2018-04-09 VITALS — BP 122/60 | HR 84 | Ht 72.0 in | Wt 226.0 lb

## 2018-04-09 DIAGNOSIS — F17210 Nicotine dependence, cigarettes, uncomplicated: Secondary | ICD-10-CM | POA: Diagnosis not present

## 2018-04-09 DIAGNOSIS — J449 Chronic obstructive pulmonary disease, unspecified: Secondary | ICD-10-CM | POA: Diagnosis not present

## 2018-04-09 DIAGNOSIS — J309 Allergic rhinitis, unspecified: Secondary | ICD-10-CM | POA: Diagnosis not present

## 2018-04-09 DIAGNOSIS — F17200 Nicotine dependence, unspecified, uncomplicated: Secondary | ICD-10-CM

## 2018-04-09 MED ORDER — IPRATROPIUM-ALBUTEROL 20-100 MCG/ACT IN AERS: 2.0000 | INHALATION_SPRAY | Freq: Four times a day (QID) | RESPIRATORY_TRACT | 5 refills | Status: DC

## 2018-04-09 MED ORDER — LORATADINE 10 MG PO TABS: 10.0000 mg | ORAL_TABLET | Freq: Every day | ORAL | 5 refills | Status: DC

## 2018-04-09 MED ORDER — FLUTICASONE PROPIONATE 50 MCG/ACT NA SUSP: 2.0000 | Freq: Every day | NASAL | 5 refills | Status: DC

## 2018-04-09 NOTE — Progress Notes (Signed)
Subjective:    Patient ID: Sean Saunders, male    DOB: 10/15/1946, 72 y.o.   MRN: [id number]  HPI 72 year old smoker (estimates 25 pk-yrs) with a history of diabetes, hypertension, renal insufficiency, cerebrovascular disease with a history of stroke/TIA, hyperlipidemia, prior DVT. Remote sinus surgery.  He is referred today by Dr. Betty Martinique for evaluation of increased chest and sinus/nasal congestion. Often coughs up whitish phlegm. Has been more problematic over the last 3 months. He relates this to some progression of seasonal allergies. He has been treated with steroids and abx several times over this time.   He carries a history of presumed COPD that was made over 10 yrs ago.  He has been managed with Advair, DuoNeb which he takes, and Combivent. Started BD's about 2 yrs ago. No longer on Advair. He uses the others prn. Uses combivent every morning at least once. He has dyspnea w exertion - goes to the gym and notes that he has to stop with fast walking. He uses flonase prn for the last week, nasal saline spray prn.   Discussed the importance of smoking cessation with hime today. He understands the importance. Discussed strategies with him. He has succeed in the past by doing cold [place]. He has already cut down to 6 cig daily.  I have recommended nicotine gum with a quit date set for May 1.   Review of Systems  Constitutional: Negative for fever and unexpected weight change.  HENT: Negative for congestion, dental problem, ear pain, nosebleeds, postnasal drip, rhinorrhea, sinus pressure, sneezing, sore throat and trouble swallowing.   Eyes: Negative for redness and itching.  Respiratory: Positive for cough, chest tightness, shortness of breath and wheezing.   Cardiovascular: Negative for palpitations and leg swelling.  Gastrointestinal: Negative for nausea and vomiting.  Genitourinary: Negative for dysuria.  Musculoskeletal: Negative for joint swelling.  Skin: Negative for rash.    Neurological: Negative for headaches.  Hematological: Does not bruise/bleed easily.  Psychiatric/Behavioral: Negative for dysphoric mood. The patient is not nervous/anxious.    Past Medical History:  Diagnosis Date  . Arthritis   . Bronchitis    hx of  . COPD (chronic obstructive pulmonary disease) ([place])   . Diabetes mellitus   . DVT (deep venous thrombosis) ([place])   . GERD (gastroesophageal reflux disease)   . Hyperlipidemia   . Hypertension   . Hypothyroidism   . Onychomycosis 06/07/2013  . Stroke [facility])    TIA's 01/05/11-01/08/11-01/26/11  . Thyroid disease   . TIA (transient ischemic attack) 08/03/2011   Left sided weakness. 3 episodes Feb-March  . TIA (transient ischemic attack)    x 3     Family History  Problem Relation Age of Onset  . Stroke Mother   . Diabetes Mother   . Other Father        alzheimers  . Diabetes Father   . Stroke Father   . Diabetes Sister      Social History   Socioeconomic History  . Marital status: Married    Spouse name: Not on file  . Number of children: Not on file  . Years of education: Not on file  . Highest education level: Not on file  Occupational History  . Not on file  Social Needs  . Financial resource strain: Not on file  . Food insecurity:    Worry: Not on file    Inability: Not on file  . Transportation needs:    Medical: Not on file  Non-medical: Not on file  Tobacco Use  . Smoking status: Current Every Day Smoker    Packs/day: 1.00    Years: 25.00    Pack years: 25.00    Types: Cigarettes  . Smokeless tobacco: Never Used  Substance and Sexual Activity  . Alcohol use: Yes    Alcohol/week: 0.0 oz    Comment: rare  . Drug use: No  . Sexual activity: Not on file  Lifestyle  . Physical activity:    Days per week: Not on file    Minutes per session: Not on file  . Stress: Not on file  Relationships  . Social connections:    Talks on phone: Not on file    Gets together: Not on file    Attends religious  service: Not on file    Active member of club or organization: Not on file    Attends meetings of clubs or organizations: Not on file    Relationship status: Not on file  . Intimate partner violence:    Fear of current or ex partner: Not on file    Emotionally abused: Not on file    Physically abused: Not on file    Forced sexual activity: Not on file  Other Topics Concern  . Not on file  Social History Narrative  . Not on file  Was in the [employer], Magazine features editor, was in [place], exposed to [employer]. Goes to the [place] for his DM management.  Worked as a Software engineer for [employer]  Allergies  Allergen Reactions  . Eszopiclone Itching  . Niaspan [Niacin Er] Nausea And Vomiting and Rash     Outpatient Medications Prior to Visit  Medication Sig Dispense Refill  . albuterol-ipratropium (COMBIVENT) 18-103 MCG/ACT inhaler Inhale 1 puff into the lungs every morning.    Marland Kitchen amLODipine (NORVASC) 10 MG tablet Take 10 mg by mouth daily.    . Cholecalciferol (VITAMIN D) 2000 UNITS CAPS Take 2,000 Units by mouth daily.    . clopidogrel (PLAVIX) 75 MG tablet Take 75 mg by mouth daily.      . furosemide (LASIX) 20 MG tablet Take 20 mg by mouth daily.     . hydrochlorothiazide (HYDRODIURIL) 25 MG tablet Take 25 mg by mouth 2 (two) times daily.    . insulin regular human CONCENTRATED (HUMULIN R) 500 UNIT/ML SOLN injection 50 units in the morning, 40 units at lunch, and 60 units at dinner.    . levothyroxine (SYNTHROID, LEVOTHROID) 150 MCG tablet Take 150 mcg by mouth daily before breakfast.    . Multiple Vitamin (MULTIVITAMIN WITH MINERALS) TABS tablet Take 1 tablet by mouth daily.    . saxagliptin HCl (ONGLYZA) 5 MG TABS tablet Take 5 mg by mouth daily.    . sildenafil (REVATIO) 20 MG tablet Take 20 mg by mouth.    . Testosterone 10 MG/ACT (2%) GEL daily.  0  . valsartan (DIOVAN) 320 MG tablet Take 320 mg by mouth daily.    . Fluticasone-Salmeterol (ADVAIR DISKUS) 250-50 MCG/DOSE AEPB Inhale 1 puff into the  lungs 2 (two) times daily. 60 each 3  . ipratropium-albuterol (DUONEB) 0.5-2.5 (3) MG/3ML SOLN Take 3 mLs by nebulization every 8 (eight) hours. 60 mL 0  . pantoprazole (PROTONIX) 20 MG tablet Take 1 tablet (20 mg total) by mouth 2 (two) times daily before a meal. 60 tablet 0   No facility-administered medications prior to visit.         Objective:   Physical Exam Vitals:  04/09/18 0856 04/09/18 0857  BP:  122/60  Pulse:  84  SpO2:  99%  Weight: 226 lb (102.5 kg)   Height: 6' (1.829 m)    Gen: Pleasant, obese man, in no distress,  normal affect, smells of smoke  ENT: No lesions,  mouth clear,  oropharynx clear, no postnasal drip  Neck: No JVD, some mild UA noise on exp, strong voice.   Lungs: No use of accessory muscles, no wheezes, good air movement.   Cardiovascular: RRR, heart sounds normal, no murmur or gallops, no peripheral edema  Musculoskeletal: No deformities, no cyanosis or clubbing  Neuro: alert, non focal  Skin: Warm, no lesions or rash    09/04/17 --  COMPARISON:  07/03/2012  FINDINGS: Minimal atelectasis at the left base. No acute consolidation or pleural effusion. Mild hyper inflation. Normal cardiomediastinal silhouette. No pneumothorax.  IMPRESSION: No active cardiopulmonary disease.     Assessment & Plan:  COPD (chronic obstructive pulmonary disease) ([place]) Based on his description suspect that he does have COPD.  He is using Combivent or DuoNeb on as-needed basis.  He will likely benefit from schedule bronchodilator.  I will check pulmonary function testing.  Increase his Combivent to the standard schedule 4 symptom is chest congestion, times a day to see if he gets any benefit.  His biggest most bothersome coupled with his head congestion.  We will try to better address his allergies as below.  Chest x-ray from 08/2017 was normal.  Rhinitis, allergic We will try scheduling loratadine and fluticasone nasal spray at least during the remainder of  the spring allergy season.  He may not need to take these on a schedule year-round.  Tobacco use disorder I discussed his smoking in detail with him today.  He is motivated to quit, has already cut down to 6 cigarettes daily.  He agreed to set a quit date for Apr 11, 2018.  He is going to use [place] gum to assist with withdrawal symptoms.  We will support him and his efforts.  I did give him some advice on how best to succeed.  About 5 or 6 minutes just spent discussing this issue.  Baltazar Apo, MD, PhD 04/09/2018, 9:48 AM [facility] [phone] or if no answer [phone]

## 2018-04-09 NOTE — Assessment & Plan Note (Signed)
We will try scheduling loratadine and fluticasone nasal spray at least during the remainder of the spring allergy season.  He may not need to take these on a schedule year-round.

## 2018-04-09 NOTE — Assessment & Plan Note (Signed)
Based on his description suspect that he does have COPD.  He is using Combivent or DuoNeb on as-needed basis.  He will likely benefit from schedule bronchodilator.  I will check pulmonary function testing.  Increase his Combivent to the standard schedule 4 symptom is chest congestion, times a day to see if he gets any benefit.  His biggest most bothersome coupled with his head congestion.  We will try to better address his allergies as below.  Chest x-ray from 08/2017 was normal.

## 2018-04-09 NOTE — Patient Instructions (Addendum)
Please increase your Combivent to 2 puffs 4 times a day until next visit.  Keep track of whether this helps with your breathing, mucus production. Start using loratadine 10 mg once a day until next visit. Start using fluticasone nasal spray 2 sprays each nostril once a day until next visit (even if you are not having significant nasal congestion at the time) We will perform full pulmonary function testing to assess the severity of COPD. Congratulations on your goal to stop smoking.  We agreed on a quit date of Apr 11, 2018.  Agree with using nicotine gum. Follow with Dr Lamonte Sakai next available with full pulmonary function testing on the same day.

## 2018-04-09 NOTE — Assessment & Plan Note (Signed)
I discussed his smoking in detail with him today.  He is motivated to quit, has already cut down to 6 cigarettes daily.  He agreed to set a quit date for Apr 11, 2018.  He is going to use Nicorette gum to assist with withdrawal symptoms.  We will support him and his efforts.  I did give him some advice on how best to succeed.  About 5 or 6 minutes just spent discussing this issue.

## 2018-05-04 ENCOUNTER — Ambulatory Visit: Payer: Medicare HMO | Admitting: Family Medicine

## 2018-05-22 ENCOUNTER — Encounter: Payer: Self-pay | Admitting: Emergency Medicine

## 2018-05-22 ENCOUNTER — Ambulatory Visit (INDEPENDENT_AMBULATORY_CARE_PROVIDER_SITE_OTHER): Payer: Medicare HMO | Admitting: Emergency Medicine

## 2018-05-22 DIAGNOSIS — F17200 Nicotine dependence, unspecified, uncomplicated: Secondary | ICD-10-CM

## 2018-05-22 DIAGNOSIS — J449 Chronic obstructive pulmonary disease, unspecified: Secondary | ICD-10-CM | POA: Diagnosis not present

## 2018-05-22 LAB — PULMONARY FUNCTION TEST
DL/VA % pred: 66 %
DL/VA: 3.14 ml/min/mmHg/L
DLCO unc % pred: 51 %
DLCO unc: 18.21 ml/min/mmHg
FEF 25-75 Post: 0.9 L/sec
FEF 25-75 Pre: 0.73 L/sec
FEF2575-%Change-Post: 23 %
FEF2575-%Pred-Post: 35 %
FEF2575-%Pred-Pre: 28 %
FEV1-%Change-Post: 5 %
FEV1-%Pred-Post: 52 %
FEV1-%Pred-Pre: 49 %
FEV1-Post: 1.8 L
FEV1-Pre: 1.7 L
FEV1FVC-%Change-Post: 0 %
FEV1FVC-%Pred-Pre: 80 %
FEV6-%Change-Post: 8 %
FEV6-%Pred-Post: 67 %
FEV6-%Pred-Pre: 62 %
FEV6-Post: 2.95 L
FEV6-Pre: 2.73 L
FEV6FVC-%Change-Post: 3 %
FEV6FVC-%Pred-Post: 103 %
FEV6FVC-%Pred-Pre: 100 %
FVC-%Change-Post: 4 %
FVC-%Pred-Post: 64 %
FVC-%Pred-Pre: 61 %
FVC-Post: 3.02 L
FVC-Pre: 2.89 L
Post FEV1/FVC ratio: 59 %
Post FEV6/FVC ratio: 97 %
Pre FEV1/FVC ratio: 59 %
Pre FEV6/FVC Ratio: 94 %
RV % pred: 137 %
RV: 3.59 L
TLC % pred: 91 %
TLC: 6.79 L

## 2018-05-22 MED ORDER — FLUTICASONE-UMECLIDIN-VILANT 100-62.5-25 MCG/INH IN AEPB: 1.0000 | INHALATION_SPRAY | Freq: Every day | RESPIRATORY_TRACT | 0 refills | Status: DC

## 2018-05-22 NOTE — Patient Instructions (Addendum)
Please stop combivent for now We will start Trelegy one inhalation once a day. Rinse and gargle after using.  Please keep albuterol available to use 2 puffs if needed for shortness of breath, cough, wheezing.  Congratulations on stopping smoking.  Continue to work hard on this, it will benefit your health significantly. Walking oximetry today on room air. Follow with Dr Lamonte Sakai in 1 month or next available to assess your progress on the new medication.

## 2018-05-22 NOTE — Progress Notes (Signed)
PFT done today. 

## 2018-05-22 NOTE — Progress Notes (Signed)
Subjective:    Patient ID: Sean Saunders, male    DOB: [birth year], 72 y.o.   MRN: [id number]  HPI 72 year old smoker (estimates 25 pk-yrs) with a history of diabetes, hypertension, renal insufficiency, cerebrovascular disease with a history of stroke/TIA, hyperlipidemia, prior DVT. Remote sinus surgery.  He is referred today by Dr. Betty Saunders for evaluation of increased chest and sinus/nasal congestion. Often coughs up whitish phlegm. Has been more problematic over the last 3 months. He relates this to some progression of seasonal allergies. He has been treated with steroids and abx several times over this time.   He carries a history of presumed COPD that was made over 10 yrs ago.  He has been managed with Advair, DuoNeb which he takes, and Combivent. Started BD's about 2 yrs ago. No longer on Advair. He uses the others prn. Uses combivent every morning at least once. He has dyspnea w exertion - goes to the gym and notes that he has to stop with fast walking. He uses flonase prn for the last week, nasal saline spray prn.   Discussed the importance of smoking cessation with hime today. He understands the importance. Discussed strategies with him. He has succeed in the past by doing cold [place]. He has already cut down to 6 cig daily.  I have recommended nicotine gum with a quit date set for May 1.   ROV 05/22/18 --this is a follow-up visit for patient with a history of tobacco use And dyspnea.  He continues to smoke.  He follows up today after pulmonary function testing which I have reviewed.  This shows severe obstruction with out of significant bronchodilator response.  He has hyperinflated lung volumes and a significantly decreased diffusion capacity that does not correct when adjusted for his alveolar volume. He is using combivent tid currently. He quit smoking about a week ago. He averages 2-3 flares a year.     Review of Systems  Constitutional: Negative for fever and unexpected weight  change.  HENT: Negative for congestion, dental problem, ear pain, nosebleeds, postnasal drip, rhinorrhea, sinus pressure, sneezing, sore throat and trouble swallowing.   Eyes: Negative for redness and itching.  Respiratory: Positive for cough, chest tightness, shortness of breath and wheezing.   Cardiovascular: Negative for palpitations and leg swelling.  Gastrointestinal: Negative for nausea and vomiting.  Genitourinary: Negative for dysuria.  Musculoskeletal: Negative for joint swelling.  Skin: Negative for rash.  Neurological: Negative for headaches.  Hematological: Does not bruise/bleed easily.  Psychiatric/Behavioral: Negative for dysphoric mood. The patient is not nervous/anxious.    Past Medical History:  Diagnosis Date  . Arthritis   . Bronchitis    hx of  . COPD (chronic obstructive pulmonary disease) ([place])   . Diabetes mellitus   . DVT (deep venous thrombosis) ([place])   . GERD (gastroesophageal reflux disease)   . Hyperlipidemia   . Hypertension   . Hypothyroidism   . Onychomycosis 06/07/2013  . Stroke [facility])    TIA's 01/05/11-01/08/11-01/26/11  . Thyroid disease   . TIA (transient ischemic attack) 08/03/2011   Left sided weakness. 3 episodes Feb-March  . TIA (transient ischemic attack)    x 3     Family History  Problem Relation Age of Onset  . Stroke Mother   . Diabetes Mother   . Other Father        alzheimers  . Diabetes Father   . Stroke Father   . Diabetes Sister      Social History  Socioeconomic History  . Marital status: Married    Spouse name: Not on file  . Number of children: Not on file  . Years of education: Not on file  . Highest education level: Not on file  Occupational History  . Not on file  Social Needs  . Financial resource strain: Not on file  . Food insecurity:    Worry: Not on file    Inability: Not on file  . Transportation needs:    Medical: Not on file    Non-medical: Not on file  Tobacco Use  . Smoking status: Current  Every Day Smoker    Packs/day: 1.00    Years: 25.00    Pack years: 25.00    Types: Cigarettes  . Smokeless tobacco: Never Used  Substance and Sexual Activity  . Alcohol use: Yes    Alcohol/week: 0.0 oz    Comment: rare  . Drug use: No  . Sexual activity: Not on file  Lifestyle  . Physical activity:    Days per week: Not on file    Minutes per session: Not on file  . Stress: Not on file  Relationships  . Social connections:    Talks on phone: Not on file    Gets together: Not on file    Attends religious service: Not on file    Active member of club or organization: Not on file    Attends meetings of clubs or organizations: Not on file    Relationship status: Not on file  . Intimate partner violence:    Fear of current or ex partner: Not on file    Emotionally abused: Not on file    Physically abused: Not on file    Forced sexual activity: Not on file  Other Topics Concern  . Not on file  Social History Narrative  . Not on file  Was in the [employer], Magazine features editor, was in [place], exposed to [employer]. Goes to the [place] for his DM management.  Worked as a Software engineer for [employer]  Allergies  Allergen Reactions  . Eszopiclone Itching  . Niaspan [Niacin Er] Nausea And Vomiting and Rash     Outpatient Medications Prior to Visit  Medication Sig Dispense Refill  . albuterol-ipratropium (COMBIVENT) 18-103 MCG/ACT inhaler Inhale 1 puff into the lungs every morning.    Marland Kitchen amLODipine (NORVASC) 10 MG tablet Take 10 mg by mouth daily.    . Cholecalciferol (VITAMIN D) 2000 UNITS CAPS Take 2,000 Units by mouth daily.    . clopidogrel (PLAVIX) 75 MG tablet Take 75 mg by mouth daily.      . fluticasone (FLONASE) 50 MCG/ACT nasal spray Place 2 sprays into both nostrils daily. 16 g 5  . furosemide (LASIX) 20 MG tablet Take 20 mg by mouth daily.     . hydrochlorothiazide (HYDRODIURIL) 25 MG tablet Take 25 mg by mouth 2 (two) times daily.    . insulin regular human CONCENTRATED (HUMULIN R) 500  UNIT/ML SOLN injection 50 units in the morning, 40 units at lunch, and 60 units at dinner.    . Ipratropium-Albuterol (COMBIVENT RESPIMAT) 20-100 MCG/ACT AERS respimat Inhale 2 puffs into the lungs 4 (four) times daily. 1 Inhaler 5  . ipratropium-albuterol (DUONEB) 0.5-2.5 (3) MG/3ML SOLN Take 3 mLs by nebulization every 8 (eight) hours. 60 mL 0  . levothyroxine (SYNTHROID, LEVOTHROID) 150 MCG tablet Take 150 mcg by mouth daily before breakfast.    . loratadine (CLARITIN) 10 MG tablet Take 1 tablet (10  mg total) by mouth daily. 30 tablet 5  . Multiple Vitamin (MULTIVITAMIN WITH MINERALS) TABS tablet Take 1 tablet by mouth daily.    . pantoprazole (PROTONIX) 20 MG tablet Take 1 tablet (20 mg total) by mouth 2 (two) times daily before a meal. 60 tablet 0  . saxagliptin HCl (ONGLYZA) 5 MG TABS tablet Take 5 mg by mouth daily.    . sildenafil (REVATIO) 20 MG tablet Take 20 mg by mouth.    . Testosterone 10 MG/ACT (2%) GEL daily.  0  . valsartan (DIOVAN) 320 MG tablet Take 320 mg by mouth daily.     No facility-administered medications prior to visit.         Objective:   Physical Exam Vitals:   05/22/18 1122 05/22/18 1128  BP:  (!) 152/80  Pulse:  82  SpO2:  99%  Weight: 223 lb (101.2 kg)   Height: 6' (1.829 m)    Gen: Pleasant, obese man, in no distress,  normal affect, smells of smoke  ENT: No lesions,  mouth clear,  oropharynx clear, no postnasal drip  Neck: No JVD, no UA noise today  Lungs: No use of accessory muscles, wheeze on a forced exp  Cardiovascular: RRR, heart sounds normal, no murmur or gallops, no peripheral edema  Musculoskeletal: No deformities, no cyanosis or clubbing  Neuro: alert, non focal  Skin: Warm, no lesions or rash    09/04/17 --  COMPARISON:  07/03/2012  FINDINGS: Minimal atelectasis at the left base. No acute consolidation or pleural effusion. Mild hyper inflation. Normal cardiomediastinal silhouette. No pneumothorax.  IMPRESSION: No  active cardiopulmonary disease.     Assessment & Plan:  COPD (chronic obstructive pulmonary disease) (HCC) Severe obstruction on his pulmonary function testing today.  Also a significant diffusion defect, question whether he will desaturate with ambulation.  We will check this today.  I like to start him on Trelegy, see if he benefits.  Follow-up in approximately 1 month to assess.  Congratulated him on his smoking cessation  Tobacco use disorder He stopped about 1 week ago.  We talked today about strategies to stay off the cigarettes.  Baltazar Apo, MD, PhD 05/22/2018, 11:48 AM [facility] [phone] or if no answer [phone]

## 2018-05-22 NOTE — Assessment & Plan Note (Signed)
Severe obstruction on his pulmonary function testing today.  Also a significant diffusion defect, question whether he will desaturate with ambulation.  We will check this today.  I like to start him on Trelegy, see if he benefits.  Follow-up in approximately 1 month to assess.  Congratulated him on his smoking cessation

## 2018-05-22 NOTE — Addendum Note (Signed)
Addended by: Desmond Dike C on: 05/22/2018 11:50 AM   Modules accepted: Orders

## 2018-05-22 NOTE — Assessment & Plan Note (Signed)
He stopped about 1 week ago.  We talked today about strategies to stay off the cigarettes.

## 2018-05-29 ENCOUNTER — Ambulatory Visit: Payer: Medicare HMO | Admitting: Physician Assistant

## 2018-06-11 ENCOUNTER — Telehealth: Payer: Self-pay | Admitting: Emergency Medicine

## 2018-06-11 MED ORDER — FLUTICASONE-UMECLIDIN-VILANT 100-62.5-25 MCG/INH IN AEPB: 1.0000 | INHALATION_SPRAY | Freq: Every day | RESPIRATORY_TRACT | 5 refills | Status: DC

## 2018-06-11 NOTE — Telephone Encounter (Signed)
Patient returned phone call; pt contact # [phone]

## 2018-06-11 NOTE — Telephone Encounter (Signed)
Spoke with pt. He is needing a prescription for Trelegy. Rx has been sent in. Nothing further was needed.

## 2018-06-11 NOTE — Telephone Encounter (Signed)
lmtcb x1 for pt. 

## 2018-06-13 ENCOUNTER — Encounter: Payer: Self-pay | Admitting: Podiatry

## 2018-06-13 ENCOUNTER — Ambulatory Visit (INDEPENDENT_AMBULATORY_CARE_PROVIDER_SITE_OTHER): Payer: Medicare HMO | Admitting: Podiatry

## 2018-06-13 DIAGNOSIS — I739 Peripheral vascular disease, unspecified: Secondary | ICD-10-CM

## 2018-06-13 DIAGNOSIS — B351 Tinea unguium: Secondary | ICD-10-CM | POA: Diagnosis not present

## 2018-06-13 NOTE — Progress Notes (Signed)
Subjective: 72 y.o. year old male patient presents complaining of painful nails. Patient requests toe nails, corns and calluses trimmed.  Blood sugar level is at about 150.  Objective: Dermatologic: Thick yellow deformed nails x 10. Vascular: Pedal pulses are not palpable. Orthopedic: No gross deformities. Neurologic: All epicritic and tactile sensations grossly intact.  Assessment: Dystrophic mycotic nails x 10. Diabetic not controlled. PVD.  Treatment: All mycotic nails debrided.  Return in 3 months or as needed.

## 2018-06-13 NOTE — Patient Instructions (Signed)
Seen for hypertrophic nails. All nails debrided. Return in 3 months or as needed.  

## 2018-07-05 ENCOUNTER — Ambulatory Visit (INDEPENDENT_AMBULATORY_CARE_PROVIDER_SITE_OTHER): Payer: Medicare HMO | Admitting: Emergency Medicine

## 2018-07-05 ENCOUNTER — Encounter: Payer: Self-pay | Admitting: Emergency Medicine

## 2018-07-05 DIAGNOSIS — J309 Allergic rhinitis, unspecified: Secondary | ICD-10-CM

## 2018-07-05 DIAGNOSIS — J449 Chronic obstructive pulmonary disease, unspecified: Secondary | ICD-10-CM | POA: Diagnosis not present

## 2018-07-05 DIAGNOSIS — F17200 Nicotine dependence, unspecified, uncomplicated: Secondary | ICD-10-CM

## 2018-07-05 NOTE — Patient Instructions (Signed)
Congratulations on stopping smoking.  Please do everything possible to avoid restarting. We will continue Trelegy once daily.  Remember to rinse and gargle after using this medication. Keep her Combivent available to use if needed for any shortness of breath, wheezing, chest tightness. Use Flonase and or loratadine if needed for increased nasal congestion, allergy symptoms. Follow with Dr Lamonte Sakai in 6 months or sooner if you have any problems

## 2018-07-05 NOTE — Assessment & Plan Note (Signed)
Significant improvement after starting Trelegy.  Also suspect that smoking cessation is been beneficial, has helped his breathing, cough, mucus burden.   We will continue Trelegy once daily.  Remember to rinse and gargle after using this medication. Keep her Combivent available to use if needed for any shortness of breath, wheezing, chest tightness.

## 2018-07-05 NOTE — Assessment & Plan Note (Signed)
Congratulations on stopping smoking.  Please do everything possible to avoid restarting.

## 2018-07-05 NOTE — Progress Notes (Signed)
Subjective:    Patient ID: Sean Saunders, male    DOB: 08-21-1946, 72 y.o.   MRN: [id number]  HPI 72 year old smoker (estimates 25 pk-yrs) with a history of diabetes, hypertension, renal insufficiency, cerebrovascular disease with a history of stroke/TIA, hyperlipidemia, prior DVT. Remote sinus surgery.  He is referred today by Dr. Betty Martinique for evaluation of increased chest and sinus/nasal congestion. Often coughs up whitish phlegm. Has been more problematic over the last 3 months. He relates this to some progression of seasonal allergies. He has been treated with steroids and abx several times over this time.   He carries a history of presumed COPD that was made over 10 yrs ago.  He has been managed with Advair, DuoNeb which he takes, and Combivent. Started BD's about 2 yrs ago. No longer on Advair. He uses the others prn. Uses combivent every morning at least once. He has dyspnea w exertion - goes to the gym and notes that he has to stop with fast walking. He uses flonase prn for the last week, nasal saline spray prn.   Discussed the importance of smoking cessation with hime today. He understands the importance. Discussed strategies with him. He has succeed in the past by doing cold [place]. He has already cut down to 6 cig daily.  I have recommended nicotine gum with a quit date set for May 1.   ROV 05/22/18 --this is a follow-up visit for patient with a history of tobacco use And dyspnea.  He continues to smoke.  He follows up today after pulmonary function testing which I have reviewed.  This shows severe obstruction with out of significant bronchodilator response.  He has hyperinflated lung volumes and a significantly decreased diffusion capacity that does not correct when adjusted for his alveolar volume. He is using combivent tid currently. He quit smoking about a week ago. He averages 2-3 flares a year.   ROV 07/05/18 --72 year old man with a history of tobacco use and severe obstructive  lung disease based on his pulmonary function testing.  At his last visit we started him on Trelegy to see if you benefit.  He returns today reporting that it has helped him significantly.  Also of note he has stopped smoking as of 1 month ago. He has some intermittent rhinitis, flonase prn, uses loratadine prn. He coughs clear mucous - helps his breathing.  No wheeze. Better walk distance, functional capacity. He has combivent to use prn - never uses.      Review of Systems  Constitutional: Negative for fever and unexpected weight change.  HENT: Negative for congestion, dental problem, ear pain, nosebleeds, postnasal drip, rhinorrhea, sinus pressure, sneezing, sore throat and trouble swallowing.   Eyes: Negative for redness and itching.  Respiratory: Positive for cough, chest tightness, shortness of breath and wheezing.   Cardiovascular: Negative for palpitations and leg swelling.  Gastrointestinal: Negative for nausea and vomiting.  Genitourinary: Negative for dysuria.  Musculoskeletal: Negative for joint swelling.  Skin: Negative for rash.  Neurological: Negative for headaches.  Hematological: Does not bruise/bleed easily.  Psychiatric/Behavioral: Negative for dysphoric mood. The patient is not nervous/anxious.        Objective:   Physical Exam Vitals:   07/05/18 0909  BP: 110/68  Pulse: 78  SpO2: 96%  Weight: 224 lb (101.6 kg)  Height: 6' (1.829 m)   Gen: Pleasant, obese man, in no distress,  normal affect, smells of smoke  ENT: No lesions,  mouth clear,  oropharynx clear, no  postnasal drip  Neck: No JVD, no UA noise  Lungs: No use of accessory muscles, distant, no wheeze  Cardiovascular: RRR, heart sounds normal, no murmur or gallops, no peripheral edema  Musculoskeletal: No deformities, no cyanosis or clubbing  Neuro: alert, non focal  Skin: Warm, no lesions or rash    09/04/17 --  COMPARISON:  07/03/2012  FINDINGS: Minimal atelectasis at the left base. No  acute consolidation or pleural effusion. Mild hyper inflation. Normal cardiomediastinal silhouette. No pneumothorax.  IMPRESSION: No active cardiopulmonary disease.     Assessment & Plan:  COPD (chronic obstructive pulmonary disease) ([place]) Significant improvement after starting Trelegy.  Also suspect that smoking cessation is been beneficial, has helped his breathing, cough, mucus burden.   We will continue Trelegy once daily.  Remember to rinse and gargle after using this medication. Keep her Combivent available to use if needed for any shortness of breath, wheezing, chest tightness.  Rhinitis, allergic Use Flonase and or loratadine if needed for increased nasal congestion, allergy symptoms.  Tobacco use disorder Congratulations on stopping smoking.  Please do everything possible to avoid restarting.  Baltazar Apo, MD, PhD 07/05/2018, 9:34 AM [facility] [phone] or if no answer [phone]

## 2018-07-05 NOTE — Assessment & Plan Note (Signed)
Use Flonase and or loratadine if needed for increased nasal congestion, allergy symptoms.

## 2018-09-13 ENCOUNTER — Encounter: Payer: Self-pay | Admitting: Podiatry

## 2018-09-13 ENCOUNTER — Ambulatory Visit (INDEPENDENT_AMBULATORY_CARE_PROVIDER_SITE_OTHER): Payer: Medicare HMO | Admitting: Podiatry

## 2018-09-13 DIAGNOSIS — I739 Peripheral vascular disease, unspecified: Secondary | ICD-10-CM

## 2018-09-13 DIAGNOSIS — B351 Tinea unguium: Secondary | ICD-10-CM | POA: Diagnosis not present

## 2018-09-13 DIAGNOSIS — E1169 Type 2 diabetes mellitus with other specified complication: Secondary | ICD-10-CM | POA: Diagnosis not present

## 2018-09-13 NOTE — Patient Instructions (Signed)
Seen for hypertrophic nails. All nails debrided. Return as needed.  

## 2018-09-13 NOTE — Progress Notes (Signed)
Subjective: 72 y.o. year old male patient presents complaining of painful nails. Patient requests toe nails, corns and calluses trimmed.  Blood sugar level is at about between 120 and 250. Under going bladder tumor monitoring.  Objective: Dermatologic: Thick yellow deformed nails x 10. Vascular: Pedal pulses are not palpable. Orthopedic: No gross deformities. Neurologic: All epicritic and tactile sensations grossly intact.  Assessment: Dystrophic mycotic nails x 10. Diabetic not controlled. PVD.  Treatment: All mycotic nails debrided.  Return in 3 months or as needed.

## 2018-09-17 ENCOUNTER — Encounter: Payer: Self-pay | Admitting: Family Medicine

## 2018-09-17 ENCOUNTER — Ambulatory Visit (INDEPENDENT_AMBULATORY_CARE_PROVIDER_SITE_OTHER): Payer: Medicare HMO | Admitting: Family Medicine

## 2018-09-17 VITALS — BP 130/82 | HR 82 | Temp 98.5°F | Resp 16 | Ht 72.0 in | Wt 232.1 lb

## 2018-09-17 DIAGNOSIS — E1140 Type 2 diabetes mellitus with diabetic neuropathy, unspecified: Secondary | ICD-10-CM

## 2018-09-17 DIAGNOSIS — J449 Chronic obstructive pulmonary disease, unspecified: Secondary | ICD-10-CM | POA: Diagnosis not present

## 2018-09-17 DIAGNOSIS — Z794 Long term (current) use of insulin: Secondary | ICD-10-CM | POA: Diagnosis not present

## 2018-09-17 DIAGNOSIS — D320 Benign neoplasm of cerebral meninges: Secondary | ICD-10-CM | POA: Insufficient documentation

## 2018-09-17 DIAGNOSIS — I119 Hypertensive heart disease without heart failure: Secondary | ICD-10-CM

## 2018-09-17 DIAGNOSIS — J309 Allergic rhinitis, unspecified: Secondary | ICD-10-CM | POA: Diagnosis not present

## 2018-09-17 DIAGNOSIS — Z23 Encounter for immunization: Secondary | ICD-10-CM

## 2018-09-17 DIAGNOSIS — E1122 Type 2 diabetes mellitus with diabetic chronic kidney disease: Secondary | ICD-10-CM | POA: Diagnosis not present

## 2018-09-17 DIAGNOSIS — N183 Chronic kidney disease, stage 3 unspecified: Secondary | ICD-10-CM | POA: Insufficient documentation

## 2018-09-17 DIAGNOSIS — D333 Benign neoplasm of cranial nerves: Secondary | ICD-10-CM | POA: Diagnosis not present

## 2018-09-17 NOTE — Progress Notes (Signed)
HPI:   Sean Saunders is a 72 y.o. male, who is here today for follow up on some of his chronic medical problems.  He was last seen on 03/06/2018, due to COPD exacerbation. Since his last visit he has followed with different providers through the [place].  He has also made some arrangements, POA and living will, states that he has a "death file." He has written instructions for his wife, who is not aware of MRI findings. 07/2013: Brain MRI: 1.  Increasing size of right para falcine meningioma with minimal change from last year but more significant change since 2012. 2.  Stable appearance of the left vestibular schwannoma at the apex of the internal auditory canal. 3.  Similar appearance of increased periventricular and subcortical T2 hyperintensities, likely reflecting the sequelae of chronic microvascular ischemia.   He was recently Dx with bladder cyst and has been following with neurosurgeon due to "2 brain tumor." According to pt one tumor is stable and the other one is growing. Due to chronic medical problem he is not a good surgical candidate, he has discussed chemo and radiation but he has refused.  He has appt for brain MRI and to see Dr Glory Rosebush next week and depending on interval changes, discussion about treatment will take place. He states that he already signed DNR documents at the [place]. He tells me he does not want any basic or advance resuscitation measures.   Hypertension:  Currently on Amlodipine 10 mg,Hydralazine 25 mg bid,Valsartan 320 mg,and HCTZ 25 mg. He is also on Furosemide 20 mg q 2 days to help with LE edema. Hx of PAD , s/p arterial bypass LE. CKD III, he saw nephrologists through the [place] a month ago. E GFR 38 2 weeks ago, a month ago eGFR 44. He is reporting that he had renal US done and a bladder "cyst" was seen. Pending abdominal MRI 10/08/18. Following with urologist.  He is not checking BP at home. Last eye exam 3 months ago,Hx of glaucoma, follows  every 3-4 months.  He is taking medications as instructed, no side effects reported.  He has not noted unusual headache, visual changes, exertional chest pain, dyspnea,  focal weakness, or edema.   Lab Results  Component Value Date   CREATININE 2.15 (H) 02/02/2018   BUN 82 (H) 02/02/2018   NA 141 02/02/2018   K 4.6 02/02/2018   CL 104 02/02/2018   CO2 23 02/02/2018    Diabetes Mellitus II:  Started recently on Ozempic weekly, he has been trying to titrate dose to 0.5 mg but every time he does he starts with vomiting and diarrhea.  Intermittent nausea.  He is also on Humulin R 50 U am,60 U noon and pm. Problem has been stable. Checking BS's : 130's-180's. HgA1C 2 weeks ago was 7.9 (per pt report). He follows with endocrinologist.  Hx of peripheral neuropathy.  COPD symptoms have been better controlled with Trelegy Ellipta, he is using Duoneb neb once daily. Former smoker.  He is having nasal congestion,rhinorrhea,and post nasal drainage. No fever or sore throat. He is on Loratadine 10 mg daily.  Review of Systems  Constitutional: Positive for fatigue. Negative for activity change, appetite change and fever.  HENT: Positive for congestion, postnasal drip and rhinorrhea. Negative for nosebleeds, sore throat and trouble swallowing.   Eyes: Negative for redness and visual disturbance.  Respiratory: Positive for cough. Negative for shortness of breath and wheezing.   Cardiovascular: Positive for leg  swelling. Negative for chest pain and palpitations.  Gastrointestinal: Positive for diarrhea, nausea and vomiting. Negative for abdominal pain.  Endocrine: Negative for polydipsia, polyphagia and polyuria.  Genitourinary: Negative for decreased urine volume, dysuria and hematuria.  Musculoskeletal: Negative for gait problem and myalgias.  Skin: Negative for rash and wound.  Allergic/Immunologic: Positive for environmental allergies.  Neurological: Negative for syncope, weakness  and headaches.    Current Outpatient Medications on File Prior to Visit  Medication Sig Dispense Refill  . allopurinol (ZYLOPRIM) 100 MG tablet Take 100 mg by mouth 2 (two) times daily.    Marland Kitchen amLODipine (NORVASC) 10 MG tablet Take 10 mg by mouth daily.    . clopidogrel (PLAVIX) 75 MG tablet Take 75 mg by mouth daily.      . fluticasone (FLONASE) 50 MCG/ACT nasal spray Place 2 sprays into both nostrils daily. 16 g 5  . Fluticasone-Umeclidin-Vilant (TRELEGY ELLIPTA) 100-62.5-25 MCG/INH AEPB Inhale 1 puff into the lungs daily. 60 each 5  . hydrALAZINE (APRESOLINE) 25 MG tablet Take 25 mg by mouth 2 (two) times daily.    . hydrochlorothiazide (HYDRODIURIL) 25 MG tablet Take 25 mg by mouth 2 (two) times daily.    . insulin regular human CONCENTRATED (HUMULIN R) 500 UNIT/ML SOLN injection 50 units in the morning, 40 units at lunch, and 60 units at dinner.    . levothyroxine (SYNTHROID, LEVOTHROID) 150 MCG tablet Take 150 mcg by mouth daily before breakfast.    . loratadine (CLARITIN) 10 MG tablet Take 1 tablet (10 mg total) by mouth daily. 30 tablet 5  . Multiple Vitamin (MULTIVITAMIN WITH MINERALS) TABS tablet Take 1 tablet by mouth daily.    Marland Kitchen rOPINIRole (REQUIP) 0.25 MG tablet Take 0.25 mg by mouth at bedtime.    . rosuvastatin (CRESTOR) 20 MG tablet Take 20 mg by mouth daily.    . Semaglutide (OZEMPIC, 1 MG/DOSE, [place]ie) Inject into the skin.    . tamsulosin (FLOMAX) 0.4 MG CAPS capsule Take 0.4 mg by mouth.    . valsartan (DIOVAN) 320 MG tablet Take 320 mg by mouth daily.    . furosemide (LASIX) 20 MG tablet Take 20 mg by mouth daily.     Marland Kitchen ipratropium-albuterol (DUONEB) 0.5-2.5 (3) MG/3ML SOLN Take 3 mLs by nebulization every 8 (eight) hours. 60 mL 0   No current facility-administered medications on file prior to visit.      Past Medical History:  Diagnosis Date  . Arthritis   . Bronchitis    hx of  . COPD (chronic obstructive pulmonary disease) ([place])   . Diabetes mellitus   . DVT (deep  venous thrombosis) ([place])   . GERD (gastroesophageal reflux disease)   . Hyperlipidemia   . Hypertension   . Hypothyroidism   . Onychomycosis 06/07/2013  . Stroke [facility])    TIA's 01/05/11-01/08/11-01/26/11  . Thyroid disease   . TIA (transient ischemic attack) 08/03/2011   Left sided weakness. 3 episodes Feb-March  . TIA (transient ischemic attack)    x 3   Allergies  Allergen Reactions  . Eszopiclone Itching  . Niaspan [Niacin Er] Nausea And Vomiting and Rash   Past Surgical History:  Procedure Laterality Date  . ABDOMINAL AORTAGRAM N/A 06/04/2012   Procedure: ABDOMINAL Maxcine Ham;  Surgeon: Angelia Mould, MD;  Location: [facility] CATH LAB;  Service: Cardiovascular;  Laterality: N/A;  . APPENDECTOMY    . COLONOSCOPY    . FEMORAL-POPLITEAL BYPASS GRAFT  07/05/2012   Procedure: BYPASS GRAFT FEMORAL-POPLITEAL ARTERY;  Surgeon:  Angelia Mould, MD;  Location: [facility] OR;  Service: Vascular;  Laterality: Left;  Revision Left fem/pop w/ artery patch angioplasty utilizing left GSV graft and extenion to tib/per trunk w/right GSV and interop arteriogram  . JOINT REPLACEMENT  2011   -Hip  . NASAL SINUS SURGERY    . PR VEIN BYPASS GRAFT,AORTO-FEM-POP    . PR VEIN BYPASS GRAFT,AORTO-FEM-POP  08-01-11   (L) Fem-pop BP  . toe nail    . TONSILLECTOMY       Social History   Socioeconomic History  . Marital status: Married    Spouse name: Not on file  . Number of children: Not on file  . Years of education: Not on file  . Highest education level: Not on file  Occupational History  . Not on file  Social Needs  . Financial resource strain: Not on file  . Food insecurity:    Worry: Not on file    Inability: Not on file  . Transportation needs:    Medical: Not on file    Non-medical: Not on file  Tobacco Use  . Smoking status: Former Smoker    Packs/day: 1.00    Years: 25.00    Pack years: 25.00    Types: Cigarettes    Last attempt to quit: 06/05/2018    Years since quitting: 0.2  .  Smokeless tobacco: Never Used  Substance and Sexual Activity  . Alcohol use: Yes    Alcohol/week: 0.0 standard drinks    Comment: rare  . Drug use: No  . Sexual activity: Not on file  Lifestyle  . Physical activity:    Days per week: Not on file    Minutes per session: Not on file  . Stress: Not on file  Relationships  . Social connections:    Talks on phone: Not on file    Gets together: Not on file    Attends religious service: Not on file    Active member of club or organization: Not on file    Attends meetings of clubs or organizations: Not on file    Relationship status: Not on file  Other Topics Concern  . Not on file  Social History Narrative  . Not on file    Vitals:   09/17/18 1113  BP: 130/82  Pulse: 82  Resp: 16  Temp: 98.5 F (36.9 C)  SpO2: 93%   Body mass index is 31.48 kg/m.   Physical Exam  Nursing note and vitals reviewed. Constitutional: He is oriented to person, place, and time. He appears well-developed. No distress.  HENT:  Head: Normocephalic and atraumatic.  Mouth/Throat: Oropharynx is clear and moist and mucous membranes are normal.  Eyes: Pupils are equal, round, and reactive to light. Conjunctivae are normal.  Cardiovascular: Normal rate and regular rhythm.  No murmur heard. Pulses:      Dorsalis pedis pulses are 2+ on the right side, and 2+ on the left side.  Respiratory: Effort normal and breath sounds normal. No respiratory distress.  GI: Soft. He exhibits no mass. There is no hepatomegaly. There is no tenderness.  Musculoskeletal: He exhibits edema (1+ pitting LE edema, bilateral).  Lymphadenopathy:    He has no cervical adenopathy.  Neurological: He is alert and oriented to person, place, and time. He has normal strength. No cranial nerve deficit. Gait normal.  Skin: Skin is warm. No rash noted. No erythema.  Psychiatric: He has a normal mood and affect.  Well groomed, good eye contact.  ASSESSMENT AND PLAN:   Mr.  MARTESE VANATTA was seen today for 6 months follow-up.  Orders Placed This Encounter  Procedures  . Flu vaccine HIGH DOSE PF    1. Type 2 diabetes mellitus with diabetic neuropathy, without long-term current use of insulin (HCC) We discussed side effects of GLP-1 inh. I think goal could be around 7.5 given his complex CV history. He is not tolerating Ozempic well,recommend trying a different GLP-1 like Victoza or Trulicity. He prefers to wait until he sees his endocrinologist. Instructed about warning sings.   2. Hypertension with heart disease Better controlled. No changes in current management. Continue low salt diet. Monitor BP at home.  3. CKD (chronic kidney disease), stage III ([place]) Slowly getting worse. Adequate hydration. No changes in Valsartan 320 mg. Adequate BP and DM II controlled.  4. Chronic obstructive pulmonary disease, unspecified COPD type ([place]) Well controlled. No changes in current management. Continue following with Dr Lamonte Sakai.  5. Allergic rhinitis, unspecified seasonality, unspecified trigger Continue Flonase intranasal spray daily as needed. Saline nasal irrigations several times per day.   6. Encounter for immunization - Flu vaccine HIGH DOSE PF  7. Meningioma, cerebral ([place]) Per pt report,it is growing, asymptomatic. Has appt for MRI and appt with neurosurgeon.   8. Vestibular schwannoma ([place]) Asymptomatic. It has been stable,per pt report. Keep appt with neurosurgeon.    40 min face to face OV. > 50% was dedicated to discussion of Dx, prognosis, treatment options, and some side effects of some of his medications. He has appt with nephrologist 10/2018, ophthalmologist 11/2018, and endocrinologist 10/2018. End of life directives discussed. He signed a DNR form today.      Jazon Jipson G. Martinique, MD  [facility]. [place] office.

## 2018-09-17 NOTE — Patient Instructions (Signed)
A few things to remember from today's visit:   Type 2 diabetes mellitus with diabetic neuropathy, without long-term current use of insulin ([place])  Hypertension with heart disease  CKD (chronic kidney disease), stage III (HCC)  Chronic obstructive pulmonary disease, unspecified COPD type (HCC)  Allergic rhinitis, unspecified seasonality, unspecified trigger  Nasal saline irrigations daily , No changes in rest of meds.  Adequate hydration for kidneys. Leg elevation for swelling and compression stockings.   Please be sure medication list is accurate. If a new problem present, please set up appointment sooner than planned today.

## 2018-09-18 DIAGNOSIS — D320 Benign neoplasm of cerebral meninges: Secondary | ICD-10-CM | POA: Diagnosis not present

## 2018-09-20 DIAGNOSIS — D1801 Hemangioma of skin and subcutaneous tissue: Secondary | ICD-10-CM | POA: Diagnosis not present

## 2018-09-20 DIAGNOSIS — L570 Actinic keratosis: Secondary | ICD-10-CM | POA: Diagnosis not present

## 2018-09-20 DIAGNOSIS — L814 Other melanin hyperpigmentation: Secondary | ICD-10-CM | POA: Diagnosis not present

## 2018-09-20 DIAGNOSIS — Z85828 Personal history of other malignant neoplasm of skin: Secondary | ICD-10-CM | POA: Diagnosis not present

## 2018-09-20 DIAGNOSIS — D2261 Melanocytic nevi of right upper limb, including shoulder: Secondary | ICD-10-CM | POA: Diagnosis not present

## 2018-09-20 DIAGNOSIS — D485 Neoplasm of uncertain behavior of skin: Secondary | ICD-10-CM | POA: Diagnosis not present

## 2018-09-20 DIAGNOSIS — C44712 Basal cell carcinoma of skin of right lower limb, including hip: Secondary | ICD-10-CM | POA: Diagnosis not present

## 2018-09-20 DIAGNOSIS — D225 Melanocytic nevi of trunk: Secondary | ICD-10-CM | POA: Diagnosis not present

## 2018-09-21 DIAGNOSIS — D333 Benign neoplasm of cranial nerves: Secondary | ICD-10-CM | POA: Diagnosis not present

## 2018-09-21 DIAGNOSIS — D320 Benign neoplasm of cerebral meninges: Secondary | ICD-10-CM | POA: Diagnosis not present

## 2018-09-21 DIAGNOSIS — I10 Essential (primary) hypertension: Secondary | ICD-10-CM | POA: Diagnosis not present

## 2018-12-11 ENCOUNTER — Encounter: Payer: Self-pay | Admitting: Family Medicine

## 2018-12-11 ENCOUNTER — Ambulatory Visit (INDEPENDENT_AMBULATORY_CARE_PROVIDER_SITE_OTHER): Payer: Medicare HMO

## 2018-12-11 ENCOUNTER — Ambulatory Visit (INDEPENDENT_AMBULATORY_CARE_PROVIDER_SITE_OTHER): Payer: Medicare HMO | Admitting: Family Medicine

## 2018-12-11 VITALS — BP 140/70 | HR 81 | Temp 98.2°F | Resp 16 | Ht 72.0 in | Wt 230.0 lb

## 2018-12-11 DIAGNOSIS — S8992XA Unspecified injury of left lower leg, initial encounter: Secondary | ICD-10-CM | POA: Diagnosis not present

## 2018-12-11 DIAGNOSIS — Z23 Encounter for immunization: Secondary | ICD-10-CM | POA: Diagnosis not present

## 2018-12-11 DIAGNOSIS — F17200 Nicotine dependence, unspecified, uncomplicated: Secondary | ICD-10-CM | POA: Diagnosis not present

## 2018-12-11 DIAGNOSIS — L03116 Cellulitis of left lower limb: Secondary | ICD-10-CM

## 2018-12-11 DIAGNOSIS — M79605 Pain in left leg: Secondary | ICD-10-CM | POA: Diagnosis not present

## 2018-12-11 MED ORDER — TRAMADOL HCL 50 MG PO TABS: 50.0000 mg | ORAL_TABLET | Freq: Three times a day (TID) | ORAL | 0 refills | Status: AC | PRN

## 2018-12-11 MED ORDER — SULFAMETHOXAZOLE-TRIMETHOPRIM 800-160 MG PO TABS: 1.0000 | ORAL_TABLET | Freq: Two times a day (BID) | ORAL | 0 refills | Status: AC

## 2018-12-11 NOTE — Progress Notes (Addendum)
ACUTE VISIT   HPI:  Chief Complaint  Patient presents with  . Left shin injury    hit shin on car door on Friday, redness, painful and drainage    Sean Saunders is a 72 y.o. male with history of DM 2, COPD, CKD, PAD, and peripheral neuropathy, who is here today complaining of left lower extremity pain that started right after injury 12/07/2017. Late Friday he hit left pretibial area against car door.  Severe pain developed and superficial laceration.  Next day he noted erythema and clear exudate. Constant pain that initially was 10/10, throbbing and sharp. Now pain is 7/10.  Pain is exacerbated by walking and alleviated by rest. He took amoxicillin x 5 tablets, left from all prescription.  He denies fever, chills, or MS changes. + Smoker.   Review of Systems  Constitutional: Negative for appetite change, chills, fatigue and fever.  Respiratory: Positive for cough, shortness of breath (Exertional, no more than usual.) and wheezing.   Cardiovascular: Negative for chest pain, palpitations and leg swelling.  Gastrointestinal: Negative for abdominal pain, nausea and vomiting.  Musculoskeletal: Positive for myalgias.  Skin: Positive for rash and wound.      Current Outpatient Medications on File Prior to Visit  Medication Sig Dispense Refill  . allopurinol (ZYLOPRIM) 100 MG tablet Take 100 mg by mouth 2 (two) times daily.    Marland Kitchen amLODipine (NORVASC) 10 MG tablet Take 10 mg by mouth daily.    . clopidogrel (PLAVIX) 75 MG tablet Take 75 mg by mouth daily.      . fluticasone (FLONASE) 50 MCG/ACT nasal spray Place 2 sprays into both nostrils daily. 16 g 5  . Fluticasone-Umeclidin-Vilant (TRELEGY ELLIPTA) 100-62.5-25 MCG/INH AEPB Inhale 1 puff into the lungs daily. 60 each 5  . furosemide (LASIX) 20 MG tablet Take 20 mg by mouth daily.     . hydrALAZINE (APRESOLINE) 25 MG tablet Take 25 mg by mouth 2 (two) times daily.    . hydrochlorothiazide (HYDRODIURIL) 25 MG tablet  Take 25 mg by mouth 2 (two) times daily.    . insulin regular human CONCENTRATED (HUMULIN R) 500 UNIT/ML SOLN injection 50 units in the morning, 40 units at lunch, and 60 units at dinner.    . levothyroxine (SYNTHROID, LEVOTHROID) 150 MCG tablet Take 150 mcg by mouth daily before breakfast.    . Multiple Vitamin (MULTIVITAMIN WITH MINERALS) TABS tablet Take 1 tablet by mouth daily.    Marland Kitchen rOPINIRole (REQUIP) 0.25 MG tablet Take 0.25 mg by mouth at bedtime.    . rosuvastatin (CRESTOR) 20 MG tablet Take 20 mg by mouth daily.    . Semaglutide (OZEMPIC, 1 MG/DOSE, ) Inject into the skin.    . tamsulosin (FLOMAX) 0.4 MG CAPS capsule Take 0.4 mg by mouth.    . valsartan (DIOVAN) 320 MG tablet Take 320 mg by mouth daily.     No current facility-administered medications on file prior to visit.      Past Medical History:  Diagnosis Date  . Arthritis   . Bronchitis    hx of  . COPD (chronic obstructive pulmonary disease) ([place])   . Diabetes mellitus   . DVT (deep venous thrombosis) ([place])   . GERD (gastroesophageal reflux disease)   . Hyperlipidemia   . Hypertension   . Hypothyroidism   . Onychomycosis 06/07/2013  . Stroke [facility])    TIA's 01/05/11-01/08/11-01/26/11  . Thyroid disease   . TIA (transient ischemic attack) 08/03/2011  Left sided weakness. 3 episodes Feb-March  . TIA (transient ischemic attack)    x 3   Allergies  Allergen Reactions  . Eszopiclone Itching  . Niaspan [Niacin Er] Nausea And Vomiting and Rash    Social History   Socioeconomic History  . Marital status: Married    Spouse name: Not on file  . Number of children: Not on file  . Years of education: Not on file  . Highest education level: Not on file  Occupational History  . Not on file  Social Needs  . Financial resource strain: Not on file  . Food insecurity:    Worry: Not on file    Inability: Not on file  . Transportation needs:    Medical: Not on file    Non-medical: Not on file  Tobacco Use  .  Smoking status: Former Smoker    Packs/day: 1.00    Years: 25.00    Pack years: 25.00    Types: Cigarettes    Last attempt to quit: 06/05/2018    Years since quitting: 0.5  . Smokeless tobacco: Never Used  Substance and Sexual Activity  . Alcohol use: Yes    Alcohol/week: 0.0 standard drinks    Comment: rare  . Drug use: No  . Sexual activity: Not on file  Lifestyle  . Physical activity:    Days per week: Not on file    Minutes per session: Not on file  . Stress: Not on file  Relationships  . Social connections:    Talks on phone: Not on file    Gets together: Not on file    Attends religious service: Not on file    Active member of club or organization: Not on file    Attends meetings of clubs or organizations: Not on file    Relationship status: Not on file  Other Topics Concern  . Not on file  Social History Narrative  . Not on file    Vitals:   12/11/18 0900  BP: 140/70  Pulse: 81  Resp: 16  Temp: 98.2 F (36.8 C)  SpO2: 97%   Body mass index is 31.19 kg/m.   Physical Exam  Nursing note and vitals reviewed. Constitutional: He appears well-developed and well-nourished. No distress.  HENT:  Head: Normocephalic and atraumatic.  Mouth/Throat: Mucous membranes are normal.  Eyes: Conjunctivae are normal.  Cardiovascular: Normal rate and regular rhythm.  No murmur heard. Left DP and PT present.  Respiratory: Effort normal and breath sounds normal. No respiratory distress.  Musculoskeletal:        General: Tenderness and edema (Trace pitting edema LE, bilateral.) present.  Skin: Skin is warm. There is erythema.  Pretibial area of left lower extremity with horizontal superficial laceration, no active bleeding. Macular erythema, local heat, and very tender upon light touch. See picture  Psychiatric: He has a normal mood and affect.  Well groomed, good eye contact.        ASSESSMENT AND PLAN:  [name] was seen today for left shin  injury.  Diagnoses and all orders for this visit:  Leg injury, left, initial encounter For pain management he will take tramadol for a few days, we discussed some side effects. Fall precautions discussed. Further recommendation will be given according to imaging results.  -     traMADol (ULTRAM) 50 MG tablet; Take 1 tablet (50 mg total) by mouth every 8 (eight) hours as needed for up to 5 days. -     DG  Tibia/Fibula Left; Future -     Tdap vaccine greater than or equal to 7yo IM  Cellulitis of left leg Instructed to keep area clean with soap and water. Lower extremity elevation. Some side effects of antibiotic discussed. Instructed about warning signs.  -     sulfamethoxazole-trimethoprim (BACTRIM DS,SEPTRA DS) 800-160 MG tablet; Take 1 tablet by mouth 2 (two) times daily for 7 days. -     Tdap vaccine greater than or equal to 7yo IM  Tobacco use disorder  We discussed adverse effects of tobacco use. He has decreased tobacco use but he is not interested in smoking cessation.     Return in about 2 weeks (around 12/25/2018), or if symptoms worsen or fail to improve.     Betty G. Martinique, MD  [facility]. [place] office.

## 2018-12-11 NOTE — Patient Instructions (Signed)
A few things to remember from today's visit:   Leg injury, left, initial encounter - Plan: traMADol (ULTRAM) 50 MG tablet, DG Tibia/Fibula Left  Cellulitis of left leg - Plan: sulfamethoxazole-trimethoprim (BACTRIM DS,SEPTRA DS) 800-160 MG tablet   Keep area clean with soap and water. Remember that tramadol can cause drowsiness, therefore increase the risk for falls. It is going to take a few weeks to completely heal. Smoking cessation encouraged, tobacco can slow down healing process.

## 2018-12-27 DIAGNOSIS — M79672 Pain in left foot: Secondary | ICD-10-CM | POA: Diagnosis not present

## 2018-12-27 DIAGNOSIS — L603 Nail dystrophy: Secondary | ICD-10-CM | POA: Diagnosis not present

## 2018-12-27 DIAGNOSIS — E1151 Type 2 diabetes mellitus with diabetic peripheral angiopathy without gangrene: Secondary | ICD-10-CM | POA: Diagnosis not present

## 2018-12-27 DIAGNOSIS — I739 Peripheral vascular disease, unspecified: Secondary | ICD-10-CM | POA: Diagnosis not present

## 2018-12-27 DIAGNOSIS — M79671 Pain in right foot: Secondary | ICD-10-CM | POA: Diagnosis not present

## 2018-12-27 DIAGNOSIS — L84 Corns and callosities: Secondary | ICD-10-CM | POA: Diagnosis not present

## 2019-01-01 ENCOUNTER — Encounter: Payer: Self-pay | Admitting: Family Medicine

## 2019-01-01 ENCOUNTER — Other Ambulatory Visit: Payer: Self-pay

## 2019-01-01 ENCOUNTER — Ambulatory Visit (INDEPENDENT_AMBULATORY_CARE_PROVIDER_SITE_OTHER): Payer: Medicare HMO | Admitting: Family Medicine

## 2019-01-01 VITALS — BP 158/84 | HR 83 | Temp 98.5°F | Ht 72.0 in | Wt 227.5 lb

## 2019-01-01 DIAGNOSIS — J0110 Acute frontal sinusitis, unspecified: Secondary | ICD-10-CM

## 2019-01-01 DIAGNOSIS — E1140 Type 2 diabetes mellitus with diabetic neuropathy, unspecified: Secondary | ICD-10-CM

## 2019-01-01 DIAGNOSIS — Z794 Long term (current) use of insulin: Secondary | ICD-10-CM | POA: Diagnosis not present

## 2019-01-01 DIAGNOSIS — E1169 Type 2 diabetes mellitus with other specified complication: Secondary | ICD-10-CM | POA: Diagnosis not present

## 2019-01-01 DIAGNOSIS — J441 Chronic obstructive pulmonary disease with (acute) exacerbation: Secondary | ICD-10-CM

## 2019-01-01 MED ORDER — METHYLPREDNISOLONE ACETATE 80 MG/ML IJ SUSP
80.0000 mg | Freq: Once | INTRAMUSCULAR | Status: AC
  Administered 2019-01-01: 80 mg via INTRAMUSCULAR

## 2019-01-01 MED ORDER — AMOXICILLIN-POT CLAVULANATE 875-125 MG PO TABS: 1.0000 | ORAL_TABLET | Freq: Two times a day (BID) | ORAL | 0 refills | Status: DC

## 2019-01-01 NOTE — Addendum Note (Signed)
Addended by: Anibal Henderson on: 01/01/2019 10:16 AM   Modules accepted: Orders

## 2019-01-01 NOTE — Patient Instructions (Signed)
Follow up for any increased fever or increasing shortness of breath  Use your Duoneb every 4-6 hours as needed.

## 2019-01-01 NOTE — Progress Notes (Signed)
Subjective:     Patient ID: Sean Saunders, male   DOB: 1946-02-14, 73 y.o.   MRN: [id number]  HPI Patient is seen for acute visit.  He states he developed onset of illness about last Friday.  He has COPD and is on Trelegy daily.  Quit smoking about a year and a half ago.  He has had some bilateral frontal sinus pressure along with nasal congestion.  No fever.  Increasing cough which is mostly dry but occasionally productive.  He states that in the past with similar symptoms it tends to settle in his chest and worsen over time.  Denies any dyspnea at rest.  He has rescue inhaler and nebulizer for rescue use at home  He has type 2 diabetes and is on high dose of Humulin R taking 50 units in the morning, 40 units around lunch, and 60 units with dinner.  His diabetes is followed through the [place].  He was recently started on Ozempic but is having some chronic low-grade nausea.  Past Medical History:  Diagnosis Date  . Arthritis   . Bronchitis    hx of  . COPD (chronic obstructive pulmonary disease) ([place])   . Diabetes mellitus   . DVT (deep venous thrombosis) ([place])   . GERD (gastroesophageal reflux disease)   . Hyperlipidemia   . Hypertension   . Hypothyroidism   . Onychomycosis 06/07/2013  . Stroke [facility])    TIA's 01/05/11-01/08/11-01/26/11  . Thyroid disease   . TIA (transient ischemic attack) 08/03/2011   Left sided weakness. 3 episodes Feb-March  . TIA (transient ischemic attack)    x 3   Past Surgical History:  Procedure Laterality Date  . ABDOMINAL AORTAGRAM N/A 06/04/2012   Procedure: ABDOMINAL Maxcine Ham;  Surgeon: Angelia Mould, MD;  Location: [facility] CATH LAB;  Service: Cardiovascular;  Laterality: N/A;  . APPENDECTOMY    . COLONOSCOPY    . FEMORAL-POPLITEAL BYPASS GRAFT  07/05/2012   Procedure: BYPASS GRAFT FEMORAL-POPLITEAL ARTERY;  Surgeon: Angelia Mould, MD;  Location: [facility] OR;  Service: Vascular;  Laterality: Left;  Revision Left fem/pop w/ artery patch angioplasty  utilizing left GSV graft and extenion to tib/per trunk w/right GSV and interop arteriogram  . JOINT REPLACEMENT  2011   -Hip  . NASAL SINUS SURGERY    . PR VEIN BYPASS GRAFT,AORTO-FEM-POP    . PR VEIN BYPASS GRAFT,AORTO-FEM-POP  08-01-11   (L) Fem-pop BP  . toe nail    . TONSILLECTOMY      reports that he quit smoking about 6 months ago. His smoking use included cigarettes. He has a 25.00 pack-year smoking history. He has never used smokeless tobacco. He reports current alcohol use. He reports that he does not use drugs. family history includes Diabetes in his father, mother, and sister; Other in his father; Stroke in his father and mother. Allergies  Allergen Reactions  . Eszopiclone Itching  . Niaspan [Niacin Er] Nausea And Vomiting and Rash     Review of Systems  Constitutional: Positive for fatigue. Negative for chills and fever.  HENT: Positive for congestion, sinus pressure and sinus pain.   Respiratory: Positive for cough and wheezing.   Cardiovascular: Negative for chest pain.  Gastrointestinal: Negative for abdominal pain.  Genitourinary: Negative for dysuria.  Skin: Negative for rash.  Neurological: Negative for syncope.       Objective:   Physical Exam Constitutional:      Appearance: Normal appearance.  HENT:     Right Ear: Tympanic  membrane normal.     Left Ear: Tympanic membrane normal.     Mouth/Throat:     Pharynx: Oropharynx is clear.  Neck:     Musculoskeletal: Neck supple.  Cardiovascular:     Rate and Rhythm: Normal rate and regular rhythm.  Pulmonary:     Effort: No respiratory distress.     Breath sounds: Wheezing and rhonchi present. No rales.  Musculoskeletal:     Right lower leg: No edema.     Left lower leg: No edema.  Neurological:     Mental Status: He is alert.        Assessment:     #1 acute exacerbation of COPD.  Patient is in no respiratory distress.  Pulse oximetry 98%.  He does have significant diffuse wheezing  #2 probably  early bifrontal acute sinusitis.  May be viral but high risk of complications with his COPD  #3 type 2 diabetes on high dose of insulin at baseline.    Plan:     -Monitor blood sugars closely -Depo-Medrol 80 mg IM given.  He is aware this may exacerbate his blood sugars but he does have substantial wheezing on exam. -Continue rescue inhaler at home as needed not to exceed every 4 hours -Start Augmentin 875 mg twice daily with food -Follow-up promptly for any increased shortness of breath, fever, or other concerns  Eulas Post MD [facility]

## 2019-02-01 ENCOUNTER — Other Ambulatory Visit: Payer: Self-pay

## 2019-02-01 DIAGNOSIS — I739 Peripheral vascular disease, unspecified: Secondary | ICD-10-CM

## 2019-02-01 DIAGNOSIS — I6523 Occlusion and stenosis of bilateral carotid arteries: Secondary | ICD-10-CM

## 2019-02-06 ENCOUNTER — Encounter: Payer: Self-pay | Admitting: Vascular Surgery

## 2019-02-06 ENCOUNTER — Ambulatory Visit (INDEPENDENT_AMBULATORY_CARE_PROVIDER_SITE_OTHER)
Admission: RE | Admit: 2019-02-06 | Discharge: 2019-02-06 | Disposition: A | Discharge status: Home / Self Care | Payer: Medicare HMO | Source: Ambulatory Visit | Attending: Family | Admitting: Family

## 2019-02-06 ENCOUNTER — Ambulatory Visit (HOSPITAL_COMMUNITY)
Admission: RE | Admit: 2019-02-06 | Discharge: 2019-02-06 | Disposition: A | Discharge status: Home / Self Care | Payer: Medicare HMO | Source: Ambulatory Visit | Attending: Family | Admitting: Family

## 2019-02-06 ENCOUNTER — Other Ambulatory Visit: Payer: Self-pay

## 2019-02-06 ENCOUNTER — Ambulatory Visit (INDEPENDENT_AMBULATORY_CARE_PROVIDER_SITE_OTHER): Payer: Medicare HMO | Admitting: Vascular Surgery

## 2019-02-06 ENCOUNTER — Ambulatory Visit (INDEPENDENT_AMBULATORY_CARE_PROVIDER_SITE_OTHER)
Admission: RE | Admit: 2019-02-06 | Discharge: 2019-02-06 | Disposition: A | Discharge status: Home / Self Care | Payer: Medicare HMO | Source: Ambulatory Visit | Attending: Vascular Surgery | Admitting: Vascular Surgery

## 2019-02-06 VITALS — BP 131/65 | HR 82 | Temp 97.0°F | Resp 16 | Ht 72.0 in | Wt 227.4 lb

## 2019-02-06 DIAGNOSIS — I739 Peripheral vascular disease, unspecified: Secondary | ICD-10-CM | POA: Insufficient documentation

## 2019-02-06 DIAGNOSIS — I6523 Occlusion and stenosis of bilateral carotid arteries: Secondary | ICD-10-CM | POA: Diagnosis not present

## 2019-02-06 DIAGNOSIS — J449 Chronic obstructive pulmonary disease, unspecified: Secondary | ICD-10-CM | POA: Diagnosis not present

## 2019-02-06 NOTE — Progress Notes (Signed)
Patient name: Sean Saunders MRN: [id number] DOB: 1946/08/12 Sex: male  REASON FOR VISIT:   Follow-up of peripheral vascular disease.  HPI:   Sean Saunders is a pleasant 73 y.o. male who underwent a previous aortobifemoral bypass graft in 2006.  In 2012 he had a left femoral below-knee pop bypass.  He also had some mild carotid disease which we have been following.  Since I saw him last he denies any history of claudication, rest pain, or nonhealing ulcers.  He quit smoking on 06/05/2018.  He is on Plavix and is on a statin.  He denies any history of stroke, TIAs, expressive or receptive aphasia, or amaurosis fugax.  He does describe some pain in both hips which occurs with standing and sounds like arthritis.  Unfortunately his wife was recently diagnosed with early stage lung cancer.  Past Medical History:  Diagnosis Date  . Arthritis   . Bronchitis    hx of  . COPD (chronic obstructive pulmonary disease) ([place])   . Diabetes mellitus   . DVT (deep venous thrombosis) ([place])   . GERD (gastroesophageal reflux disease)   . Hyperlipidemia   . Hypertension   . Hypothyroidism   . Onychomycosis 06/07/2013  . Stroke [facility])    TIA's 01/05/11-01/08/11-01/26/11  . Thyroid disease   . TIA (transient ischemic attack) 08/03/2011   Left sided weakness. 3 episodes Feb-March  . TIA (transient ischemic attack)    x 3    Family History  Problem Relation Age of Onset  . Stroke Mother   . Diabetes Mother   . Other Father        alzheimers  . Diabetes Father   . Stroke Father   . Diabetes Sister     SOCIAL HISTORY: Social History   Tobacco Use  . Smoking status: Former Smoker    Packs/day: 1.00    Years: 25.00    Pack years: 25.00    Types: Cigarettes    Last attempt to quit: 06/05/2018    Years since quitting: 0.6  . Smokeless tobacco: Never Used  Substance Use Topics  . Alcohol use: Yes    Alcohol/week: 0.0 standard drinks    Comment: rare    Allergies  Allergen Reactions    . Eszopiclone Itching  . Niaspan [Niacin Er] Nausea And Vomiting and Rash    Current Outpatient Medications  Medication Sig Dispense Refill  . allopurinol (ZYLOPRIM) 100 MG tablet Take 100 mg by mouth 2 (two) times daily.    Marland Kitchen amLODipine (NORVASC) 10 MG tablet Take 10 mg by mouth daily.    . clopidogrel (PLAVIX) 75 MG tablet Take 75 mg by mouth daily.      . fluticasone (FLONASE) 50 MCG/ACT nasal spray Place 2 sprays into both nostrils daily. 16 g 5  . Fluticasone-Umeclidin-Vilant (TRELEGY ELLIPTA) 100-62.5-25 MCG/INH AEPB Inhale 1 puff into the lungs daily. 60 each 5  . furosemide (LASIX) 20 MG tablet Take 20 mg by mouth daily.     . hydrALAZINE (APRESOLINE) 25 MG tablet Take 25 mg by mouth 2 (two) times daily.    . hydrochlorothiazide (HYDRODIURIL) 25 MG tablet Take 25 mg by mouth 2 (two) times daily.    . insulin regular human CONCENTRATED (HUMULIN R) 500 UNIT/ML SOLN injection 50 units in the morning, 40 units at lunch, and 60 units at dinner.    . levothyroxine (SYNTHROID, LEVOTHROID) 150 MCG tablet Take 150 mcg by mouth daily before breakfast.    . Multiple Vitamin (  MULTIVITAMIN WITH MINERALS) TABS tablet Take 1 tablet by mouth daily.    Marland Kitchen rOPINIRole (REQUIP) 0.25 MG tablet Take 0.25 mg by mouth at bedtime.    . rosuvastatin (CRESTOR) 20 MG tablet Take 20 mg by mouth daily.    . Semaglutide (OZEMPIC, 1 MG/DOSE, [place]) Inject into the skin.    . tamsulosin (FLOMAX) 0.4 MG CAPS capsule Take 0.4 mg by mouth.    . valsartan (DIOVAN) 320 MG tablet Take 320 mg by mouth daily.     No current facility-administered medications for this visit.     REVIEW OF SYSTEMS:  [X]  denotes positive finding, [ ]  denotes negative finding Cardiac  Comments:  Chest pain or chest pressure:    Shortness of breath upon exertion:    Short of breath when lying flat:    Irregular heart rhythm:        Vascular    Pain in calf, thigh, or hip brought on by ambulation:    Pain in feet at night that wakes you  up from your sleep:     Blood clot in your veins:    Leg swelling:         Pulmonary    Oxygen at home:    Productive cough:     Wheezing:         Neurologic    Sudden weakness in arms or legs:     Sudden numbness in arms or legs:     Sudden onset of difficulty speaking or slurred speech:    Temporary loss of vision in one eye:     Problems with dizziness:         Gastrointestinal    Blood in stool:     Vomited blood:         Genitourinary    Burning when urinating:     Blood in urine:        Psychiatric    Major depression:         Hematologic    Bleeding problems:    Problems with blood clotting too easily:        Skin    Rashes or ulcers:        Constitutional    Fever or chills:     PHYSICAL EXAM:   Vitals:   02/06/19 1307 02/06/19 1310  BP: 134/73 131/65  Pulse: 82   Resp: 16   Temp: (!) 97 F (36.1 C)   TempSrc: Oral   SpO2: 98%   Weight: 227 lb 6.5 oz (103.1 kg)   Height: 6' (1.829 m)     GENERAL: The patient is a well-nourished male, in no acute distress. The vital signs are documented above. CARDIAC: There is a regular rate and rhythm.  VASCULAR: He has bilateral carotid bruits. He has normal femoral pulses.  I cannot palpate pedal pulses.  He does have hyperpigmentation bilaterally consistent with chronic venous insufficiency and some spider veins bilaterally. PULMONARY: There is good air exchange bilaterally without wheezing or rales. ABDOMEN: Soft and non-tender with normal pitched bowel sounds.  MUSCULOSKELETAL: There are no major deformities or cyanosis. NEUROLOGIC: No focal weakness or paresthesias are detected. SKIN: There are no ulcers or rashes noted. PSYCHIATRIC: The patient has a normal affect.  DATA:    GRAFT DUPLEX: I have independently interpreted his duplex of his bypass graft.  This patient had an aortobifem in 2006.  In 2019 the patient had a left femoral to popliteal artery bypass below the knee and an  extension to the tibial  peroneal trunk.  The graft is widely patent with biphasic flow throughout.  ARTERIAL DOPPLER STUDY: I have independently interpreted his arterial Doppler study today.  On the right side there is a triphasic dorsalis pedis and posterior tibial signal with an ABI of 100%.  Toe pressures 124 mmHg.  On the left side there is a biphasic dorsalis pedis and posterior tibial signal.  ABIs 100%.  Toe pressure is 122 mmHg.  CAROTID DUPLEX: I have independently interpreted his carotid duplex scan today.  He has a less than 39% carotid stenosis bilaterally.  Both vertebral arteries are patent with antegrade flow.   MEDICAL ISSUES:   PERIPHERAL VASCULAR DISEASE: His aortobifemoral bypass graft and left lower extremity bypass grafts are patent.  He has normal ABIs and is asymptomatic.  He quit smoking in June 2019.  I encouraged him to stay as active as possible.  He is on a statin and is on Plavix.  I ordered follow-up ABIs in 1 year and also a graft duplex in 1 year.  He knows to call sooner if he has problems.  BILATERAL CAROTID DISEASE: The patient does have mild bilateral carotid disease and has bilateral carotid bruits.  His carotid duplex scan today shows a less than 39% stenosis bilaterally.  I think he will probably need another duplex in 2 years.  I will arrange that when he comes in in 1 year.  He is on Plavix and is on a statin.  Deitra Mayo [facility] [phone]

## 2019-02-18 ENCOUNTER — Ambulatory Visit: Payer: Medicare HMO | Admitting: Family Medicine

## 2019-04-01 ENCOUNTER — Other Ambulatory Visit: Payer: Self-pay

## 2019-04-01 ENCOUNTER — Encounter: Payer: Self-pay | Admitting: Family Medicine

## 2019-04-01 ENCOUNTER — Ambulatory Visit (INDEPENDENT_AMBULATORY_CARE_PROVIDER_SITE_OTHER): Payer: Medicare HMO | Admitting: Family Medicine

## 2019-04-01 VITALS — BP 110/70 | HR 60 | Resp 16

## 2019-04-01 DIAGNOSIS — H9203 Otalgia, bilateral: Secondary | ICD-10-CM | POA: Diagnosis not present

## 2019-04-01 DIAGNOSIS — H938X3 Other specified disorders of ear, bilateral: Secondary | ICD-10-CM

## 2019-04-01 NOTE — Progress Notes (Signed)
Virtual Visit via Video Note   I connected with [name] on 04/01/19 at 10:30 AM EDT by a video enabled telemedicine application and verified that I am speaking with the correct person using two identifiers.  Location patient: home Location provider:work or home office Persons participating in the virtual visit: patient, provider  I discussed the limitations of evaluation and management by telemedicine and the availability of in person appointments. She expressed understanding and agreed to proceed.   HPI: [name] is a 73 yo male with Hx of COPD,HTN,DM II,and tobacco use who is being seen today because 3-4 weeks of constant ear fullness "clogged"sensation. Popping ears and chewing helps temporarily. He has not identified exacerbating factors.  He started Mucinex yesterday. Denies fever,chills,headache, earache,sore throat, worsening cough,dyspnea,wheezing. Negative for recent URI,travel. Mild nasal congestion.  He does not use Q tips. Problem is stable. No Hx of cerumen impaction.  ROS: See pertinent positives and negatives per HPI. COVID-19 screening questions: Denies new fever,cough,sore throat,or possible exposure to COVID-19. Negative for loss in the sense of smell or taste.  Past Medical History:  Diagnosis Date  . Arthritis   . Bronchitis    hx of  . COPD (chronic obstructive pulmonary disease) ([place])   . Diabetes mellitus   . DVT (deep venous thrombosis) ([place])   . GERD (gastroesophageal reflux disease)   . Hyperlipidemia   . Hypertension   . Hypothyroidism   . Onychomycosis 06/07/2013  . Stroke [facility])    TIA's 01/05/11-01/08/11-01/26/11  . Thyroid disease   . TIA (transient ischemic attack) 08/03/2011   Left sided weakness. 3 episodes Feb-March  . TIA (transient ischemic attack)    x 3    Past Surgical History:  Procedure Laterality Date  . ABDOMINAL AORTAGRAM N/A 06/04/2012   Procedure: ABDOMINAL Maxcine Ham;  Surgeon: Angelia Mould, MD;  Location: [facility]  CATH LAB;  Service: Cardiovascular;  Laterality: N/A;  . APPENDECTOMY    . COLONOSCOPY    . FEMORAL-POPLITEAL BYPASS GRAFT  07/05/2012   Procedure: BYPASS GRAFT FEMORAL-POPLITEAL ARTERY;  Surgeon: Angelia Mould, MD;  Location: [facility] OR;  Service: Vascular;  Laterality: Left;  Revision Left fem/pop w/ artery patch angioplasty utilizing left GSV graft and extenion to tib/per trunk w/right GSV and interop arteriogram  . JOINT REPLACEMENT  2011   -Hip  . NASAL SINUS SURGERY    . PR VEIN BYPASS GRAFT,AORTO-FEM-POP    . PR VEIN BYPASS GRAFT,AORTO-FEM-POP  08-01-11   (L) Fem-pop BP  . toe nail    . TONSILLECTOMY      Family History  Problem Relation Age of Onset  . Stroke Mother   . Diabetes Mother   . Other Father        alzheimers  . Diabetes Father   . Stroke Father   . Diabetes Sister     Social History   Socioeconomic History  . Marital status: Married    Spouse name: Not on file  . Number of children: Not on file  . Years of education: Not on file  . Highest education level: Not on file  Occupational History  . Not on file  Social Needs  . Financial resource strain: Not on file  . Food insecurity:    Worry: Not on file    Inability: Not on file  . Transportation needs:    Medical: Not on file    Non-medical: Not on file  Tobacco Use  . Smoking status: Former Smoker    Packs/day: 1.00  Years: 25.00    Pack years: 25.00    Types: Cigarettes    Last attempt to quit: 06/05/2018    Years since quitting: 0.8  . Smokeless tobacco: Never Used  Substance and Sexual Activity  . Alcohol use: Yes    Alcohol/week: 0.0 standard drinks    Comment: rare  . Drug use: No  . Sexual activity: Not on file  Lifestyle  . Physical activity:    Days per week: Not on file    Minutes per session: Not on file  . Stress: Not on file  Relationships  . Social connections:    Talks on phone: Not on file    Gets together: Not on file    Attends religious service: Not on file     Active member of club or organization: Not on file    Attends meetings of clubs or organizations: Not on file    Relationship status: Not on file  . Intimate partner violence:    Fear of current or ex partner: Not on file    Emotionally abused: Not on file    Physically abused: Not on file    Forced sexual activity: Not on file  Other Topics Concern  . Not on file  Social History Narrative  . Not on file      Current Outpatient Medications:  .  allopurinol (ZYLOPRIM) 100 MG tablet, Take 100 mg by mouth 2 (two) times daily., Disp: , Rfl:  .  amLODipine (NORVASC) 10 MG tablet, Take 10 mg by mouth daily., Disp: , Rfl:  .  clopidogrel (PLAVIX) 75 MG tablet, Take 75 mg by mouth daily.  , Disp: , Rfl:  .  fluticasone (FLONASE) 50 MCG/ACT nasal spray, Place 2 sprays into both nostrils daily., Disp: 16 g, Rfl: 5 .  Fluticasone-Umeclidin-Vilant (TRELEGY ELLIPTA) 100-62.5-25 MCG/INH AEPB, Inhale 1 puff into the lungs daily., Disp: 60 each, Rfl: 5 .  furosemide (LASIX) 20 MG tablet, Take 20 mg by mouth daily. , Disp: , Rfl:  .  hydrALAZINE (APRESOLINE) 25 MG tablet, Take 25 mg by mouth 2 (two) times daily., Disp: , Rfl:  .  hydrochlorothiazide (HYDRODIURIL) 25 MG tablet, Take 25 mg by mouth 2 (two) times daily., Disp: , Rfl:  .  insulin regular human CONCENTRATED (HUMULIN R) 500 UNIT/ML SOLN injection, 50 units in the morning, 40 units at lunch, and 60 units at dinner., Disp: , Rfl:  .  levothyroxine (SYNTHROID, LEVOTHROID) 150 MCG tablet, Take 150 mcg by mouth daily before breakfast., Disp: , Rfl:  .  Multiple Vitamin (MULTIVITAMIN WITH MINERALS) TABS tablet, Take 1 tablet by mouth daily., Disp: , Rfl:  .  rOPINIRole (REQUIP) 0.25 MG tablet, Take 0.25 mg by mouth at bedtime., Disp: , Rfl:  .  rosuvastatin (CRESTOR) 20 MG tablet, Take 20 mg by mouth daily., Disp: , Rfl:  .  Semaglutide (OZEMPIC, 1 MG/DOSE, [place]), Inject into the skin., Disp: , Rfl:  .  tamsulosin (FLOMAX) 0.4 MG CAPS capsule, Take  0.4 mg by mouth., Disp: , Rfl:  .  valsartan (DIOVAN) 320 MG tablet, Take 320 mg by mouth daily., Disp: , Rfl:   EXAM:  VITALS per patient if applicable:BP 604/54   Pulse 60   Resp 16   GENERAL: alert, oriented, appears well and in no acute distress  HEENT: atraumatic, conjunctiva clear, no obvious abnormalities on inspection of external nose and ears. No tenderness elicited when he pulls auricula or presses tragus,bilateral.  NECK: normal movements of the  head and neck  LUNGS: on inspection no signs of respiratory distress, breathing rate appears normal, no obvious gross SOB, gasping or wheezing  CV: no obvious cyanosis  PSYCH/NEURO: pleasant and cooperative, no obvious depression or anxiety, speech and thought processing grossly intact  ASSESSMENT AND PLAN:  Discussed the following assessment and plan:  Ear discomfort, bilateral  Sensation of fullness in both ears  We discussed possible etiologies, cerumen impaction vs eustachian  tube dysfunction.  He is not having ear ache, so I do not think there is an active infectious process. Recommend auto inflation maneuvers several times per day, chewing gum, and plain Mucinex. Also recommend OTC medications like Debrox. If still having discomfort in 5 days, recommend adding docusate sodium capsule content, 2 to 3 drops in ears at night. Instructed about warning signs. If problem is persistent or he develops earache, I am going to need to see him in the clinic.   I discussed the assessment and treatment plan with the patient. He was provided an opportunity to ask questions and all were answered. The patient agreed with the plan and demonstrated an understanding of the instructions.   The patient was advised to call back or seek an in-person evaluation if the symptoms worsen or if the condition fails to improve as anticipated.  Return if symptoms worsen or fail to improve.    Halton Neas [place], MD

## 2019-04-10 DIAGNOSIS — L570 Actinic keratosis: Secondary | ICD-10-CM | POA: Diagnosis not present

## 2019-04-10 DIAGNOSIS — Z85828 Personal history of other malignant neoplasm of skin: Secondary | ICD-10-CM | POA: Diagnosis not present

## 2019-04-10 DIAGNOSIS — L814 Other melanin hyperpigmentation: Secondary | ICD-10-CM | POA: Diagnosis not present

## 2019-04-10 DIAGNOSIS — D0422 Carcinoma in situ of skin of left ear and external auricular canal: Secondary | ICD-10-CM | POA: Diagnosis not present

## 2019-04-10 DIAGNOSIS — D0462 Carcinoma in situ of skin of left upper limb, including shoulder: Secondary | ICD-10-CM | POA: Diagnosis not present

## 2019-04-10 DIAGNOSIS — C44329 Squamous cell carcinoma of skin of other parts of face: Secondary | ICD-10-CM | POA: Diagnosis not present

## 2019-04-10 DIAGNOSIS — L821 Other seborrheic keratosis: Secondary | ICD-10-CM | POA: Diagnosis not present

## 2019-04-10 DIAGNOSIS — D225 Melanocytic nevi of trunk: Secondary | ICD-10-CM | POA: Diagnosis not present

## 2019-04-10 DIAGNOSIS — D044 Carcinoma in situ of skin of scalp and neck: Secondary | ICD-10-CM | POA: Diagnosis not present

## 2019-04-16 ENCOUNTER — Other Ambulatory Visit: Payer: Self-pay | Admitting: Emergency Medicine

## 2019-04-30 DIAGNOSIS — Z85828 Personal history of other malignant neoplasm of skin: Secondary | ICD-10-CM | POA: Diagnosis not present

## 2019-04-30 DIAGNOSIS — C44329 Squamous cell carcinoma of skin of other parts of face: Secondary | ICD-10-CM | POA: Diagnosis not present

## 2019-05-14 DIAGNOSIS — L570 Actinic keratosis: Secondary | ICD-10-CM | POA: Diagnosis not present

## 2019-08-07 ENCOUNTER — Ambulatory Visit (INDEPENDENT_AMBULATORY_CARE_PROVIDER_SITE_OTHER): Payer: Medicare HMO | Admitting: Family Medicine

## 2019-08-07 ENCOUNTER — Other Ambulatory Visit: Payer: Self-pay

## 2019-08-07 ENCOUNTER — Encounter: Payer: Self-pay | Admitting: Family Medicine

## 2019-08-07 VITALS — BP 117/75 | HR 95 | Resp 16

## 2019-08-07 DIAGNOSIS — J329 Chronic sinusitis, unspecified: Secondary | ICD-10-CM

## 2019-08-07 DIAGNOSIS — H6983 Other specified disorders of Eustachian tube, bilateral: Secondary | ICD-10-CM

## 2019-08-07 DIAGNOSIS — J309 Allergic rhinitis, unspecified: Secondary | ICD-10-CM

## 2019-08-07 MED ORDER — IPRATROPIUM BROMIDE 0.06 % NA SOLN: 2.0000 | Freq: Four times a day (QID) | NASAL | 12 refills | Status: DC

## 2019-08-07 MED ORDER — FLUTICASONE PROPIONATE 50 MCG/ACT NA SUSP: 2.0000 | Freq: Every day | NASAL | 5 refills | Status: AC

## 2019-08-07 MED ORDER — DOXYCYCLINE HYCLATE 100 MG PO TABS: 100.0000 mg | ORAL_TABLET | Freq: Two times a day (BID) | ORAL | 0 refills | Status: AC

## 2019-08-07 NOTE — Progress Notes (Signed)
Virtual Visit via Video Note   I connected with Mr Sean Saunders on 08/07/19 by a video enabled telemedicine application and verified that I am speaking with the correct person using two identifiers.  Location patient: home Location provider:work  office Persons participating in the virtual visit: patient, provider  I discussed the limitations of evaluation and management by telemedicine and the availability of in person appointments. The patient expressed understanding and agreed to proceed.   HPI: Ms Sean Saunders is a 73 years old male with history of DM 2, hyperlipidemia, hypertension, COPD, and tobacco use who is complaining about "almost a week" of frontal (retro-ocular) and nose pressure.  He thinks he has a sinus infection. Allergy rhinitis, he is taking OTC "sinus medications."  + Nasal congestion and ears "clogged up" iintermittently and "popping" when chewing. Denies earache, changes in hearing, or ear drainage.  Negative for rhinorrhea or sore throat. He has tried saline nasal irrigations as needed.  He is not using Flonase nasal spray. He denies sick contact. [place].  Denies changes in the smell or taste. He denies fever, chills, change in appetite, unusual fatigue, cough, wheezing, dyspnea, chest pain, abdominal pain, nausea, or vomiting.  He feels like symptoms are getting worse.  ROS: See pertinent positives and negatives per HPI.  Past Medical History:  Diagnosis Date  . Arthritis   . Bronchitis    hx of  . COPD (chronic obstructive pulmonary disease) ([place])   . Diabetes mellitus   . DVT (deep venous thrombosis) ([place])   . GERD (gastroesophageal reflux disease)   . Hyperlipidemia   . Hypertension   . Hypothyroidism   . Onychomycosis 06/07/2013  . Stroke [facility])    TIA's 01/05/11-01/08/11-01/26/11  . Thyroid disease   . TIA (transient ischemic attack) 08/03/2011   Left sided weakness. 3 episodes Feb-March  . TIA (transient  ischemic attack)    x 3    Past Surgical History:  Procedure Laterality Date  . ABDOMINAL AORTAGRAM N/A 06/04/2012   Procedure: ABDOMINAL Maxcine Ham;  Surgeon: Angelia Mould, MD;  Location: [facility] CATH LAB;  Service: Cardiovascular;  Laterality: N/A;  . APPENDECTOMY    . COLONOSCOPY    . FEMORAL-POPLITEAL BYPASS GRAFT  07/05/2012   Procedure: BYPASS GRAFT FEMORAL-POPLITEAL ARTERY;  Surgeon: Angelia Mould, MD;  Location: [facility] OR;  Service: Vascular;  Laterality: Left;  Revision Left fem/pop w/ artery patch angioplasty utilizing left GSV graft and extenion to tib/per trunk w/right GSV and interop arteriogram  . JOINT REPLACEMENT  2011   -Hip  . NASAL SINUS SURGERY    . PR VEIN BYPASS GRAFT,AORTO-FEM-POP    . PR VEIN BYPASS GRAFT,AORTO-FEM-POP  08-01-11   (L) Fem-pop BP  . toe nail    . TONSILLECTOMY      Family History  Problem Relation Age of Onset  . Stroke Mother   . Diabetes Mother   . Other Father        alzheimers  . Diabetes Father   . Stroke Father   . Diabetes Sister     Social History   Socioeconomic History  . Marital status: Married    Spouse name: Not on file  . Number of children: Not on file  . Years of education: Not on file  . Highest education level: Not on file  Occupational History  . Not on file  Social Needs  . Financial resource strain: Not on file  . Food  insecurity    Worry: Not on file    Inability: Not on file  . Transportation needs    Medical: Not on file    Non-medical: Not on file  Tobacco Use  . Smoking status: Former Smoker    Packs/day: 1.00    Years: 25.00    Pack years: 25.00    Types: Cigarettes    Quit date: 06/05/2018    Years since quitting: 1.1  . Smokeless tobacco: Never Used  Substance and Sexual Activity  . Alcohol use: Yes    Alcohol/week: 0.0 standard drinks    Comment: rare  . Drug use: No  . Sexual activity: Not on file  Lifestyle  . Physical activity    Days per week: Not on file    Minutes per  session: Not on file  . Stress: Not on file  Relationships  . Social Herbalist on phone: Not on file    Gets together: Not on file    Attends religious service: Not on file    Active member of club or organization: Not on file    Attends meetings of clubs or organizations: Not on file    Relationship status: Not on file  . Intimate partner violence    Fear of current or ex partner: Not on file    Emotionally abused: Not on file    Physically abused: Not on file    Forced sexual activity: Not on file  Other Topics Concern  . Not on file  Social History Narrative  . Not on file    Current Outpatient Medications:  .  allopurinol (ZYLOPRIM) 100 MG tablet, Take 100 mg by mouth 2 (two) times daily., Disp: , Rfl:  .  amLODipine (NORVASC) 10 MG tablet, Take 10 mg by mouth daily., Disp: , Rfl:  .  clopidogrel (PLAVIX) 75 MG tablet, Take 75 mg by mouth daily.  , Disp: , Rfl:  .  doxycycline (VIBRA-TABS) 100 MG tablet, Take 1 tablet (100 mg total) by mouth 2 (two) times daily for 7 days., Disp: 14 tablet, Rfl: 0 .  fluticasone (FLONASE) 50 MCG/ACT nasal spray, Place 2 sprays into both nostrils daily., Disp: 16 g, Rfl: 5 .  furosemide (LASIX) 20 MG tablet, Take 20 mg by mouth daily. , Disp: , Rfl:  .  hydrALAZINE (APRESOLINE) 25 MG tablet, Take 25 mg by mouth 2 (two) times daily., Disp: , Rfl:  .  hydrochlorothiazide (HYDRODIURIL) 25 MG tablet, Take 25 mg by mouth 2 (two) times daily., Disp: , Rfl:  .  insulin regular human CONCENTRATED (HUMULIN R) 500 UNIT/ML SOLN injection, 50 units in the morning, 40 units at lunch, and 60 units at dinner., Disp: , Rfl:  .  ipratropium (ATROVENT) 0.06 % nasal spray, Place 2 sprays into both nostrils 4 (four) times daily., Disp: 15 mL, Rfl: 12 .  levothyroxine (SYNTHROID, LEVOTHROID) 150 MCG tablet, Take 150 mcg by mouth daily before breakfast., Disp: , Rfl:  .  Multiple Vitamin (MULTIVITAMIN WITH MINERALS) TABS tablet, Take 1 tablet by mouth  daily., Disp: , Rfl:  .  rOPINIRole (REQUIP) 0.25 MG tablet, Take 0.25 mg by mouth at bedtime., Disp: , Rfl:  .  rosuvastatin (CRESTOR) 20 MG tablet, Take 20 mg by mouth daily., Disp: , Rfl:  .  Semaglutide (OZEMPIC, 1 MG/DOSE, [place]), Inject into the skin., Disp: , Rfl:  .  tamsulosin (FLOMAX) 0.4 MG CAPS capsule, Take 0.4 mg by mouth., Disp: , Rfl:  .  TRELEGY ELLIPTA 100-62.5-25 MCG/INH AEPB, INHALE 1 PUFF INTO THE LUNGS DAILY, Disp: 60 each, Rfl: 1 .  valsartan (DIOVAN) 320 MG tablet, Take 320 mg by mouth daily., Disp: , Rfl:   EXAM:  VITALS per patient if applicable:BP 123XX123   Pulse 95   Resp 16 Comment: approx  GENERAL: alert, oriented, appears well and in no acute distress  HEENT: atraumatic, conjunttiva clear, no obvious abnormalities on inspection of face,external nose, and ears.  Negative for Eyes or dysphonia.  NECK: normal movements of the head and neck  LUNGS: on inspection no signs of respiratory distress, breathing rate appears normal, no obvious gross SOB, gasping or wheezing  CV: no obvious cyanosis  MS: moves all visible extremities without noticeable abnormality  PSYCH/NEURO: pleasant and cooperative, no obvious depression or anxiety, speech and thought processing grossly intact  ASSESSMENT AND PLAN:  Discussed the following assessment and plan:  Allergic rhinitis, unspecified seasonality, unspecified trigger - Plan: ipratropium (ATROVENT) 0.06 % nasal spray, fluticasone (FLONASE) 50 MCG/ACT nasal spray Symptoms he reporting could be related to this problem. Recommend continuing nasal irrigation with saline as needed. Resume Flonase nasal spray to use daily as needed. Today Atrovent 0.06% nasal spray was added.  Sinusitis, unspecified chronicity, unspecified location - Plan: doxycycline (VIBRA-TABS) 100 MG tablet Explained that I do not think antibiotic treatment is needed at this time, he really believes so.  Recommend holding on antibiotic and if in 3 to 4  days he is not feeling any better, he can restart doxycycline. We discussed some side effects of antibiotics and from taking it frequently without a clear indication. Instructed about warning signs.  Dysfunction of both eustachian tubes Advise caution with OTC decongestants given his history of hypertension and CAD. Auto inflation maneuvers a few times during the day recommended, chewing gum may also help. Follow-up as needed. No problem-specific Assessment & Plan notes found for this encounter.    I discussed the assessment and treatment plan with the patient. He was provided an opportunity to ask questions and all were answered. He agreed with the plan and demonstrated an understanding of the instructions.   The patient was advised to call back or seek an in-person evaluation if the symptoms worsen or if the condition fails to improve as anticipated.  Return if symptoms worsen or fail to improve.     Martinique, MD

## 2019-08-13 ENCOUNTER — Other Ambulatory Visit: Payer: Self-pay

## 2019-08-13 ENCOUNTER — Ambulatory Visit (INDEPENDENT_AMBULATORY_CARE_PROVIDER_SITE_OTHER): Payer: Medicare HMO

## 2019-08-13 DIAGNOSIS — Z23 Encounter for immunization: Secondary | ICD-10-CM

## 2019-08-14 ENCOUNTER — Telehealth: Payer: Self-pay

## 2019-08-14 ENCOUNTER — Other Ambulatory Visit: Payer: Self-pay | Admitting: *Deleted

## 2019-08-14 MED ORDER — PREDNISONE 20 MG PO TABS: ORAL_TABLET | ORAL | 0 refills | Status: DC

## 2019-08-14 NOTE — Telephone Encounter (Signed)
As I explained to patient, if it is not an infectious process antibiotic will not help.This abx has a good cover for bacterial sinus infection I recommend giving some more time. We can add prednisone 40 mg daily for 3 days. If pain is persistent we can order a sinus CT to rule out other causes. Thanks, BJ

## 2019-08-14 NOTE — Telephone Encounter (Signed)
Patient informed. Rx for Prednisone sent to the pharmacy.

## 2019-08-14 NOTE — Telephone Encounter (Signed)
Copied from [place] [id number]. Topic: General - Other >> Aug 14, 2019  9:32 AM Yvette Rack wrote: Reason for CRM: Pt stated the doxycycline (VIBRA-TABS) 100 MG tablet is not work. Pt would like another medication.

## 2019-08-14 NOTE — Telephone Encounter (Signed)
Message sent to Dr. Jordan for review. Please advise 

## 2019-08-20 ENCOUNTER — Telehealth: Payer: Self-pay | Admitting: Family Medicine

## 2019-08-20 DIAGNOSIS — R0982 Postnasal drip: Secondary | ICD-10-CM

## 2019-08-20 DIAGNOSIS — J310 Chronic rhinitis: Secondary | ICD-10-CM

## 2019-08-20 NOTE — Telephone Encounter (Signed)
Copied from [place] [id number]. Topic: General - Other >> Aug 20, 2019  9:41 AM Leward Quan A wrote: Reason for CRM: Patient called to inform Dr Martinique that he is still congested nasally and now he states that he is starting to get some lung congestion say that he is still taking medication as prescribed but it is not helping. Per patient he states that he get this attack once every year and usually need a steroid injection and antibiotics.  Can be reached at Ph# [phone]

## 2019-08-20 NOTE — Telephone Encounter (Signed)
Last note states a sinus CT scan could be ordered if not better. Since patient is not any better and seems to have "lung congestion" please advise.

## 2019-08-20 NOTE — Telephone Encounter (Signed)
Please explained that we already tried what he is describing: Abx and steroid (oral Prednisone) and he is not better, which means this is not an infectious process or something more serious is going on, it is why I recommend having sinus CT if there was not improvement. If he is having "lung congestion", he may also need a CXR.  Thanks, BJ

## 2019-08-22 NOTE — Addendum Note (Signed)
Addended by: Katina Dung on: 08/22/2019 01:23 PM   Modules accepted: Orders

## 2019-08-22 NOTE — Telephone Encounter (Signed)
Pt is calling and would like to let md know he is still having nasal congestion. Please advice

## 2019-08-22 NOTE — Addendum Note (Signed)
Addended by: Gwenyth Ober R on: 08/22/2019 11:20 AM   Modules accepted: Orders

## 2019-08-22 NOTE — Addendum Note (Signed)
Addended by: Katina Dung on: 08/22/2019 01:21 PM   Modules accepted: Orders

## 2019-08-22 NOTE — Telephone Encounter (Signed)
LM for pt to return call. Order for Ct of sinus has been placed since pt is no better.

## 2019-08-23 ENCOUNTER — Other Ambulatory Visit: Payer: Self-pay | Admitting: Family Medicine

## 2019-08-23 DIAGNOSIS — J3489 Other specified disorders of nose and nasal sinuses: Secondary | ICD-10-CM

## 2019-08-28 ENCOUNTER — Other Ambulatory Visit: Payer: Self-pay

## 2019-08-28 ENCOUNTER — Ambulatory Visit (INDEPENDENT_AMBULATORY_CARE_PROVIDER_SITE_OTHER)
Admission: RE | Admit: 2019-08-28 | Discharge: 2019-08-28 | Disposition: A | Discharge status: Home / Self Care | Payer: Medicare HMO | Source: Ambulatory Visit | Attending: Family Medicine | Admitting: Family Medicine

## 2019-08-28 DIAGNOSIS — J329 Chronic sinusitis, unspecified: Secondary | ICD-10-CM | POA: Diagnosis not present

## 2019-08-28 DIAGNOSIS — J310 Chronic rhinitis: Secondary | ICD-10-CM | POA: Diagnosis not present

## 2019-08-28 DIAGNOSIS — R0982 Postnasal drip: Secondary | ICD-10-CM | POA: Diagnosis not present

## 2019-08-30 ENCOUNTER — Other Ambulatory Visit: Payer: Self-pay | Admitting: Family Medicine

## 2019-08-30 ENCOUNTER — Encounter: Payer: Self-pay | Admitting: Family Medicine

## 2019-08-30 ENCOUNTER — Telehealth: Payer: Self-pay | Admitting: *Deleted

## 2019-08-30 MED ORDER — AMOXICILLIN-POT CLAVULANATE 875-125 MG PO TABS: 1.0000 | ORAL_TABLET | Freq: Two times a day (BID) | ORAL | 0 refills | Status: AC

## 2019-08-30 NOTE — Telephone Encounter (Signed)
Spoke with patient and gave recommendations per Dr. Martinique. Patient verbalized understanding.  Copied from [place] [phone]. Topic: General - Other >> Aug 30, 2019  9:54 AM Carolyn Stare wrote: Pt would like a call ack about his CT scan asking what is the next step

## 2019-09-03 ENCOUNTER — Other Ambulatory Visit: Payer: Self-pay

## 2019-09-03 ENCOUNTER — Encounter: Payer: Self-pay | Admitting: Emergency Medicine

## 2019-09-03 ENCOUNTER — Ambulatory Visit (INDEPENDENT_AMBULATORY_CARE_PROVIDER_SITE_OTHER): Payer: Medicare HMO | Admitting: Emergency Medicine

## 2019-09-03 ENCOUNTER — Ambulatory Visit (INDEPENDENT_AMBULATORY_CARE_PROVIDER_SITE_OTHER): Payer: Medicare HMO

## 2019-09-03 DIAGNOSIS — J329 Chronic sinusitis, unspecified: Secondary | ICD-10-CM | POA: Insufficient documentation

## 2019-09-03 DIAGNOSIS — R0600 Dyspnea, unspecified: Secondary | ICD-10-CM | POA: Diagnosis not present

## 2019-09-03 DIAGNOSIS — J449 Chronic obstructive pulmonary disease, unspecified: Secondary | ICD-10-CM | POA: Diagnosis not present

## 2019-09-03 DIAGNOSIS — Z8709 Personal history of other diseases of the respiratory system: Secondary | ICD-10-CM | POA: Diagnosis not present

## 2019-09-03 DIAGNOSIS — J321 Chronic frontal sinusitis: Secondary | ICD-10-CM | POA: Diagnosis not present

## 2019-09-03 MED ORDER — AMOXICILLIN-POT CLAVULANATE 875-125 MG PO TABS: 1.0000 | ORAL_TABLET | Freq: Two times a day (BID) | ORAL | 0 refills | Status: DC

## 2019-09-03 MED ORDER — PREDNISONE 10 MG PO TABS: ORAL_TABLET | ORAL | 0 refills | Status: DC

## 2019-09-03 MED ORDER — METHYLPREDNISOLONE ACETATE 80 MG/ML IJ SUSP
80.0000 mg | Freq: Once | INTRAMUSCULAR | Status: AC
  Administered 2019-09-03: 80 mg via INTRAMUSCULAR

## 2019-09-03 NOTE — Assessment & Plan Note (Signed)
Persistent symptoms despite previous antibiotic treatment, opacity on CT of the sinuses.  I think we need to extend his course out for the full 3 weeks.  He is already been given a course of Augmentin for 1 week and I will extend 2 more weeks.

## 2019-09-03 NOTE — Progress Notes (Signed)
Subjective:    Patient ID: Sean Saunders, male    DOB: March 30, 1946, 73 y.o.   MRN: [id number]  HPI  ROV 07/05/18 --73 year old man with a history of tobacco use and severe obstructive lung disease based on his pulmonary function testing.  At his last visit we started him on Trelegy to see if you benefit.  He returns today reporting that it has helped him significantly.  Also of note he has stopped smoking as of 1 month ago. He has some intermittent rhinitis, flonase prn, uses loratadine prn. He coughs clear mucous - helps his breathing.  No wheeze. Better walk distance, functional capacity. He has combivent to use prn - never uses.    ROV 09/03/2019 --follow-up visit for 73 year old man with a history of tobacco and severe obstructive lung disease.  He returns today reporting that he does most of the shopping, the household chores. He has to stop after 50 yrds to walk the dog. He remains on Trelegy. He has been dealing with a chronic sinusitis - started Augmentin 3 days ago. Prior to that he was treated with another abx and steroids, completed 6weeks ago. Headache, ear fullness.  Has uses albuterol nebs - unclear that it has helped his breathing he has heard some wheeze. He has not restarted smoking. On flonase, atrovent NS.  Unfortunately his wife has been very ill with metastatic lung CA.     Review of Systems  Constitutional: Negative for fever and unexpected weight change.  HENT: Negative for congestion, dental problem, ear pain, nosebleeds, postnasal drip, rhinorrhea, sinus pressure, sneezing, sore throat and trouble swallowing.   Eyes: Negative for redness and itching.  Respiratory: Positive for cough, chest tightness, shortness of breath and wheezing.   Cardiovascular: Negative for palpitations and leg swelling.  Gastrointestinal: Negative for nausea and vomiting.  Genitourinary: Negative for dysuria.  Musculoskeletal: Negative for joint swelling.  Skin: Negative for rash.   Neurological: Negative for headaches.  Hematological: Does not bruise/bleed easily.  Psychiatric/Behavioral: Negative for dysphoric mood. The patient is not nervous/anxious.        Objective:   Physical Exam Vitals:   09/03/19 1517  BP: (!) 152/70  Pulse: 78  SpO2: 98%  Weight: 231 lb (104.8 kg)  Height: 6' (1.829 m)   Gen: Pleasant, obese man, in no distress,  normal affect, smells of smoke  ENT: No lesions,  mouth clear,  oropharynx clear, no postnasal drip  Neck: No JVD, harsh expiratory stridor especially with cough  Lungs: No use of accessory muscles, distant, he has bilateral expiratory wheezes as well as referred upper airway noise on forced expiration or with cough  Cardiovascular: RRR, heart sounds normal, no murmur or gallops, no peripheral edema  Musculoskeletal: No deformities, no cyanosis or clubbing  Neuro: alert, non focal  Skin: Warm, no lesions or rash      Assessment & Plan:  COPD (chronic obstructive pulmonary disease) ([place]) With an acute exacerbation in the setting of what sounds like chronic sinusitis.  I will extend his antibiotic course to treat this.  He needs another steroid taper, Depo-Medrol today.  Chest x-ray today.  Walking oximetry on room air.  Chronic sinusitis Persistent symptoms despite previous antibiotic treatment, opacity on CT of the sinuses.  I think we need to extend his course out for the full 3 weeks.  He is already been given a course of Augmentin for 1 week and I will extend 2 more weeks.  Baltazar Apo, MD, PhD 09/03/2019, 3:48  PM [facility] [phone] or if no answer [phone]

## 2019-09-03 NOTE — Addendum Note (Signed)
Addended by: Desmond Dike C on: 09/03/2019 03:51 PM   Modules accepted: Orders

## 2019-09-03 NOTE — Assessment & Plan Note (Signed)
With an acute exacerbation in the setting of what sounds like chronic sinusitis.  I will extend his antibiotic course to treat this.  He needs another steroid taper, Depo-Medrol today.  Chest x-ray today.  Walking oximetry on room air.

## 2019-09-03 NOTE — Patient Instructions (Signed)
Depo-Medrol injection today Please take prednisone taper as prescribed until completely gone. We will extend your Augmentin prescription out for 2 more weeks (3 weeks total).  Please take this until completely gone Continue Trelegy 1 inhalation daily.  Rinse and gargle after using. We will refill Combivent to use 2 puffs up to every 6 hours if needed for shortness of breath Walking oximetry today on room air Chest x-ray today You need the flu shot this fall.  Do not get it now while you are acutely ill. Follow with Dr Lamonte Sakai in 3 months or sooner if you have any problems.

## 2019-10-10 DIAGNOSIS — L821 Other seborrheic keratosis: Secondary | ICD-10-CM | POA: Diagnosis not present

## 2019-10-10 DIAGNOSIS — Z85828 Personal history of other malignant neoplasm of skin: Secondary | ICD-10-CM | POA: Diagnosis not present

## 2019-10-10 DIAGNOSIS — L814 Other melanin hyperpigmentation: Secondary | ICD-10-CM | POA: Diagnosis not present

## 2019-10-10 DIAGNOSIS — C44329 Squamous cell carcinoma of skin of other parts of face: Secondary | ICD-10-CM | POA: Diagnosis not present

## 2019-10-10 DIAGNOSIS — L570 Actinic keratosis: Secondary | ICD-10-CM | POA: Diagnosis not present

## 2019-10-10 DIAGNOSIS — C44321 Squamous cell carcinoma of skin of nose: Secondary | ICD-10-CM | POA: Diagnosis not present

## 2019-10-10 DIAGNOSIS — D225 Melanocytic nevi of trunk: Secondary | ICD-10-CM | POA: Diagnosis not present

## 2019-12-20 ENCOUNTER — Ambulatory Visit: Payer: Self-pay | Admitting: *Deleted

## 2019-12-20 NOTE — Telephone Encounter (Signed)
Message Routed to PCP CMA 

## 2019-12-20 NOTE — Telephone Encounter (Signed)
I spoke with Mr Sean Saunders and states that pain has Sean Saunders is not longer having pain. He took Tylenol 500 mg x 2 last night and doing better. He has not noted fever,chills,more cough than usual (COPD),CP,N/V,heartburn, abdominal pain,or changes in bowel habits. Answered all his questions and instructed him to let me know if he needs something or if pain re-occurs.  [name] Martinique, MD

## 2019-12-20 NOTE — Telephone Encounter (Signed)
Pt called with complaints of right upper chest pain on 12/19/19; the area under his arm pit near his breast; he tried an antiacid, pain pill, and sleeping pill; the pt says the pain is aching, and now rated 3 out of 10; it was 8-9 out of 10; the pt says he ate a super burger last pm, and his pain started about 30 min after consumption; he denies SOB, difficulty breathing outside his norm because he has COPD; recommendations made per nurse triage protocol; he verbalized understanding, and would like a call back from the office for a 2nd opinion; he can be contacted at [phone]; the pt sees Dr Martinique, [facility]; attempted to contact [facility] x 2 without success; will route to office for final disposition.  Reason for Disposition . Pain also in shoulder(s) or arm(s) or jaw  Answer Assessment - Initial Assessment Questions 1. LOCATION: "Where does it hurt?"       Under right arm pit to near his breast 2. RADIATION: "Does the pain go anywhere else?" (e.g., into neck, jaw, arms, back)    no 3. ONSET: "When did the chest pain begin?" (Minutes, hours or days)      12/19/19 around 2100 4. PATTERN "Does the pain come and go, or has it been constant since it started?"  "Does it get worse with exertion?"    Constant; not worse with exertion 5. DURATION: "How long does it last" (e.g., seconds, minutes, hours)    hours 6. SEVERITY: "How bad is the pain?"  (e.g., Scale 1-10; mild, moderate, or severe)    - MILD (1-3): doesn't interfere with normal activities     - MODERATE (4-7): interferes with normal activities or awakens from sleep    - SEVERE (8-10): excruciating pain, unable to do any normal activities      moderate 7. CARDIAC RISK FACTORS: "Do you have any history of heart problems or risk factors for heart disease?" (e.g., angina, prior heart attack; diabetes, high blood pressure, high cholesterol, smoker, or strong family history of heart disease)     HTN, Diabetes 8. PULMONARY RISK FACTORS: "Do you  have any history of lung disease?"  (e.g., blood clots in lung, asthma, emphysema, birth control pills)    COPD 9. CAUSE: "What do you think is causing the chest pain?"    Not sure 10. OTHER SYMPTOMS: "Do you have any other symptoms?" (e.g., dizziness, nausea, vomiting, sweating, fever, difficulty breathing, cough)      no 11. PREGNANCY: "Is there any chance you are pregnant?" "When was your last menstrual period?"      n/a  Protocols used: CHEST PAIN-A-AH

## 2019-12-31 ENCOUNTER — Encounter (HOSPITAL_COMMUNITY): Payer: Self-pay | Admitting: Emergency Medicine

## 2019-12-31 ENCOUNTER — Emergency Department (HOSPITAL_COMMUNITY): Payer: Medicare HMO

## 2019-12-31 ENCOUNTER — Ambulatory Visit: Payer: Self-pay | Admitting: Family Medicine

## 2019-12-31 ENCOUNTER — Other Ambulatory Visit: Payer: Self-pay

## 2019-12-31 ENCOUNTER — Observation Stay (HOSPITAL_COMMUNITY): Payer: Medicare HMO

## 2019-12-31 ENCOUNTER — Inpatient Hospital Stay (HOSPITAL_COMMUNITY)
Admission: EM | Admit: 2019-12-31 | Discharge: 2020-01-03 | DRG: 440 | Disposition: A | Discharge status: Home / Self Care | Payer: Medicare HMO | Attending: Internal Medicine | Admitting: Internal Medicine

## 2019-12-31 DIAGNOSIS — E1151 Type 2 diabetes mellitus with diabetic peripheral angiopathy without gangrene: Secondary | ICD-10-CM | POA: Diagnosis present

## 2019-12-31 DIAGNOSIS — M1990 Unspecified osteoarthritis, unspecified site: Secondary | ICD-10-CM | POA: Diagnosis present

## 2019-12-31 DIAGNOSIS — I1310 Hypertensive heart and chronic kidney disease without heart failure, with stage 1 through stage 4 chronic kidney disease, or unspecified chronic kidney disease: Secondary | ICD-10-CM | POA: Diagnosis present

## 2019-12-31 DIAGNOSIS — N183 Chronic kidney disease, stage 3 unspecified: Secondary | ICD-10-CM | POA: Diagnosis present

## 2019-12-31 DIAGNOSIS — Z823 Family history of stroke: Secondary | ICD-10-CM

## 2019-12-31 DIAGNOSIS — Z20822 Contact with and (suspected) exposure to covid-19: Secondary | ICD-10-CM | POA: Diagnosis not present

## 2019-12-31 DIAGNOSIS — Z87891 Personal history of nicotine dependence: Secondary | ICD-10-CM

## 2019-12-31 DIAGNOSIS — E785 Hyperlipidemia, unspecified: Secondary | ICD-10-CM | POA: Diagnosis present

## 2019-12-31 DIAGNOSIS — Z8673 Personal history of transient ischemic attack (TIA), and cerebral infarction without residual deficits: Secondary | ICD-10-CM | POA: Diagnosis not present

## 2019-12-31 DIAGNOSIS — I10 Essential (primary) hypertension: Secondary | ICD-10-CM | POA: Diagnosis not present

## 2019-12-31 DIAGNOSIS — Z820 Family history of epilepsy and other diseases of the nervous system: Secondary | ICD-10-CM

## 2019-12-31 DIAGNOSIS — J449 Chronic obstructive pulmonary disease, unspecified: Secondary | ICD-10-CM | POA: Diagnosis present

## 2019-12-31 DIAGNOSIS — Z96641 Presence of right artificial hip joint: Secondary | ICD-10-CM | POA: Diagnosis present

## 2019-12-31 DIAGNOSIS — Z833 Family history of diabetes mellitus: Secondary | ICD-10-CM | POA: Diagnosis not present

## 2019-12-31 DIAGNOSIS — K219 Gastro-esophageal reflux disease without esophagitis: Secondary | ICD-10-CM | POA: Diagnosis present

## 2019-12-31 DIAGNOSIS — Z79899 Other long term (current) drug therapy: Secondary | ICD-10-CM | POA: Diagnosis not present

## 2019-12-31 DIAGNOSIS — Z794 Long term (current) use of insulin: Secondary | ICD-10-CM | POA: Diagnosis not present

## 2019-12-31 DIAGNOSIS — E1122 Type 2 diabetes mellitus with diabetic chronic kidney disease: Secondary | ICD-10-CM | POA: Diagnosis not present

## 2019-12-31 DIAGNOSIS — I119 Hypertensive heart disease without heart failure: Secondary | ICD-10-CM | POA: Diagnosis not present

## 2019-12-31 DIAGNOSIS — E039 Hypothyroidism, unspecified: Secondary | ICD-10-CM | POA: Diagnosis present

## 2019-12-31 DIAGNOSIS — I739 Peripheral vascular disease, unspecified: Secondary | ICD-10-CM | POA: Diagnosis not present

## 2019-12-31 DIAGNOSIS — Z79890 Hormone replacement therapy (postmenopausal): Secondary | ICD-10-CM

## 2019-12-31 DIAGNOSIS — Z888 Allergy status to other drugs, medicaments and biological substances status: Secondary | ICD-10-CM | POA: Diagnosis not present

## 2019-12-31 DIAGNOSIS — N400 Enlarged prostate without lower urinary tract symptoms: Secondary | ICD-10-CM | POA: Diagnosis not present

## 2019-12-31 DIAGNOSIS — E1140 Type 2 diabetes mellitus with diabetic neuropathy, unspecified: Secondary | ICD-10-CM | POA: Diagnosis not present

## 2019-12-31 DIAGNOSIS — K8020 Calculus of gallbladder without cholecystitis without obstruction: Secondary | ICD-10-CM | POA: Diagnosis not present

## 2019-12-31 DIAGNOSIS — Z7902 Long term (current) use of antithrombotics/antiplatelets: Secondary | ICD-10-CM | POA: Diagnosis not present

## 2019-12-31 DIAGNOSIS — K859 Acute pancreatitis without necrosis or infection, unspecified: Secondary | ICD-10-CM | POA: Diagnosis not present

## 2019-12-31 DIAGNOSIS — R109 Unspecified abdominal pain: Secondary | ICD-10-CM | POA: Diagnosis not present

## 2019-12-31 DIAGNOSIS — R079 Chest pain, unspecified: Secondary | ICD-10-CM | POA: Diagnosis not present

## 2019-12-31 LAB — CBC
HCT: 42.4 % (ref 39.0–52.0)
Hemoglobin: 13.3 g/dL (ref 13.0–17.0)
MCH: 28.8 pg (ref 26.0–34.0)
MCHC: 31.4 g/dL (ref 30.0–36.0)
MCV: 91.8 fL (ref 80.0–100.0)
Platelets: 216 10*3/uL (ref 150–400)
RBC: 4.62 MIL/uL (ref 4.22–5.81)
RDW: 13.9 % (ref 11.5–15.5)
WBC: 10.5 10*3/uL (ref 4.0–10.5)
nRBC: 0 % (ref 0.0–0.2)

## 2019-12-31 LAB — URINALYSIS, ROUTINE W REFLEX MICROSCOPIC
Bilirubin Urine: NEGATIVE
Glucose, UA: NEGATIVE mg/dL
Hgb urine dipstick: NEGATIVE
Ketones, ur: NEGATIVE mg/dL
Leukocytes,Ua: NEGATIVE
Nitrite: NEGATIVE
Protein, ur: NEGATIVE mg/dL
Specific Gravity, Urine: 1.035 — ABNORMAL HIGH (ref 1.005–1.030)
pH: 5 (ref 5.0–8.0)

## 2019-12-31 LAB — TROPONIN I (HIGH SENSITIVITY)
Troponin I (High Sensitivity): 7 ng/L (ref <18)
Troponin I (High Sensitivity): 5 ng/L (ref <18)

## 2019-12-31 LAB — LIPASE, BLOOD: Lipase: 63 U/L — ABNORMAL HIGH (ref 11–51)

## 2019-12-31 LAB — TRIGLYCERIDES: Triglycerides: 236 mg/dL — ABNORMAL HIGH (ref <150)

## 2019-12-31 LAB — COMPREHENSIVE METABOLIC PANEL
ALT: 24 U/L (ref 0–44)
AST: 18 U/L (ref 15–41)
Albumin: 4.4 g/dL (ref 3.5–5.0)
Alkaline Phosphatase: 89 U/L (ref 38–126)
Anion gap: 10 (ref 5–15)
BUN: 35 mg/dL — ABNORMAL HIGH (ref 8–23)
CO2: 25 mmol/L (ref 22–32)
Calcium: 10.4 mg/dL — ABNORMAL HIGH (ref 8.9–10.3)
Chloride: 102 mmol/L (ref 98–111)
Creatinine, Ser: 1.69 mg/dL — ABNORMAL HIGH (ref 0.61–1.24)
GFR calc Af Amer: 45 mL/min — ABNORMAL LOW (ref >60)
GFR calc non Af Amer: 39 mL/min — ABNORMAL LOW (ref >60)
Glucose, Bld: 193 mg/dL — ABNORMAL HIGH (ref 70–99)
Potassium: 4.6 mmol/L (ref 3.5–5.1)
Sodium: 137 mmol/L (ref 135–145)
Total Bilirubin: 0.5 mg/dL (ref 0.3–1.2)
Total Protein: 7.2 g/dL (ref 6.5–8.1)

## 2019-12-31 LAB — CBG MONITORING, ED: Glucose-Capillary: 228 mg/dL — ABNORMAL HIGH (ref 70–99)

## 2019-12-31 MED ORDER — ACETAMINOPHEN 325 MG PO TABS: 650.0000 mg | ORAL_TABLET | Freq: Four times a day (QID) | ORAL | Status: DC | PRN

## 2019-12-31 MED ORDER — ENOXAPARIN SODIUM 40 MG/0.4ML ~~LOC~~ SOLN
40.0000 mg | SUBCUTANEOUS | Status: DC
  Administered 2019-12-31 – 2020-01-02 (×3): 40 mg via SUBCUTANEOUS
  Filled 2019-12-31 (×3): qty 0.4

## 2019-12-31 MED ORDER — CLOPIDOGREL BISULFATE 75 MG PO TABS
75.0000 mg | ORAL_TABLET | Freq: Every day | ORAL | Status: DC
  Administered 2020-01-01 – 2020-01-03 (×3): 75 mg via ORAL
  Filled 2019-12-31 (×3): qty 1

## 2019-12-31 MED ORDER — SODIUM CHLORIDE (PF) 0.9 % IJ SOLN
INTRAMUSCULAR | Status: AC
  Filled 2019-12-31: qty 50

## 2019-12-31 MED ORDER — MORPHINE SULFATE (PF) 4 MG/ML IV SOLN
6.0000 mg | Freq: Once | INTRAVENOUS | Status: AC
  Administered 2019-12-31: 23:00:00 6 mg via INTRAVENOUS
  Filled 2019-12-31: qty 2

## 2019-12-31 MED ORDER — IOHEXOL 300 MG/ML  SOLN
100.0000 mL | Freq: Once | INTRAMUSCULAR | Status: AC | PRN
  Administered 2019-12-31: 21:00:00 80 mL via INTRAVENOUS

## 2019-12-31 MED ORDER — ACETAMINOPHEN 650 MG RE SUPP: 650.0000 mg | Freq: Four times a day (QID) | RECTAL | Status: DC | PRN

## 2019-12-31 MED ORDER — HYDRALAZINE HCL 25 MG PO TABS
25.0000 mg | ORAL_TABLET | Freq: Two times a day (BID) | ORAL | Status: DC
  Administered 2019-12-31 – 2020-01-03 (×6): 25 mg via ORAL
  Filled 2019-12-31 (×2): qty 1
  Filled 2019-12-31: qty 3
  Filled 2019-12-31 (×2): qty 1
  Filled 2019-12-31: qty 3

## 2019-12-31 MED ORDER — AMLODIPINE BESYLATE 10 MG PO TABS
10.0000 mg | ORAL_TABLET | Freq: Every day | ORAL | Status: DC
  Administered 2020-01-01 – 2020-01-03 (×3): 10 mg via ORAL
  Filled 2019-12-31: qty 1
  Filled 2019-12-31: qty 2
  Filled 2019-12-31: qty 1
  Filled 2019-12-31: qty 2

## 2019-12-31 MED ORDER — LEVOTHYROXINE SODIUM 50 MCG PO TABS
150.0000 ug | ORAL_TABLET | Freq: Every day | ORAL | Status: DC
  Administered 2020-01-02 – 2020-01-03 (×2): 150 ug via ORAL
  Filled 2019-12-31 (×3): qty 1

## 2019-12-31 MED ORDER — ROSUVASTATIN CALCIUM 20 MG PO TABS
20.0000 mg | ORAL_TABLET | Freq: Every day | ORAL | Status: DC
  Administered 2020-01-01 – 2020-01-02 (×2): 20 mg via ORAL
  Filled 2019-12-31 (×3): qty 1

## 2019-12-31 MED ORDER — MORPHINE SULFATE (PF) 4 MG/ML IV SOLN
4.0000 mg | Freq: Once | INTRAVENOUS | Status: AC
  Administered 2019-12-31: 4 mg via INTRAVENOUS
  Filled 2019-12-31: qty 1

## 2019-12-31 MED ORDER — TAMSULOSIN HCL 0.4 MG PO CAPS
0.4000 mg | ORAL_CAPSULE | Freq: Every day | ORAL | Status: DC
  Administered 2020-01-01 – 2020-01-03 (×3): 0.4 mg via ORAL
  Filled 2019-12-31 (×4): qty 1

## 2019-12-31 MED ORDER — ONDANSETRON HCL 4 MG/2ML IJ SOLN: 4.0000 mg | Freq: Four times a day (QID) | INTRAMUSCULAR | Status: DC | PRN

## 2019-12-31 MED ORDER — ALLOPURINOL 100 MG PO TABS
100.0000 mg | ORAL_TABLET | Freq: Two times a day (BID) | ORAL | Status: DC
  Administered 2019-12-31 – 2020-01-03 (×6): 100 mg via ORAL
  Filled 2019-12-31 (×7): qty 1

## 2019-12-31 MED ORDER — ONDANSETRON HCL 4 MG PO TABS: 4.0000 mg | ORAL_TABLET | Freq: Four times a day (QID) | ORAL | Status: DC | PRN

## 2019-12-31 MED ORDER — SODIUM CHLORIDE 0.9 % IV SOLN: INTRAVENOUS | Status: DC

## 2019-12-31 MED ORDER — ADULT MULTIVITAMIN W/MINERALS CH
1.0000 | ORAL_TABLET | Freq: Every day | ORAL | Status: DC
  Administered 2020-01-01 – 2020-01-03 (×3): 1 via ORAL
  Filled 2019-12-31 (×3): qty 1

## 2019-12-31 MED ORDER — MORPHINE SULFATE (PF) 2 MG/ML IV SOLN
2.0000 mg | INTRAVENOUS | Status: DC | PRN
  Administered 2020-01-01 – 2020-01-02 (×6): 4 mg via INTRAVENOUS
  Administered 2020-01-02: 2 mg via INTRAVENOUS
  Administered 2020-01-02: 4 mg via INTRAVENOUS
  Filled 2019-12-31 (×3): qty 2
  Filled 2019-12-31: qty 1
  Filled 2019-12-31 (×4): qty 2

## 2019-12-31 MED ORDER — FLUTICASONE PROPIONATE 50 MCG/ACT NA SUSP
2.0000 | Freq: Every day | NASAL | Status: DC
  Administered 2020-01-01 – 2020-01-03 (×3): 2 via NASAL
  Filled 2019-12-31: qty 16

## 2019-12-31 MED ORDER — INSULIN ASPART 100 UNIT/ML ~~LOC~~ SOLN
0.0000 [IU] | SUBCUTANEOUS | Status: DC
  Administered 2019-12-31: 7 [IU] via SUBCUTANEOUS
  Administered 2020-01-01 – 2020-01-02 (×6): 4 [IU] via SUBCUTANEOUS
  Administered 2020-01-02: 7 [IU] via SUBCUTANEOUS
  Administered 2020-01-02 (×2): 4 [IU] via SUBCUTANEOUS
  Administered 2020-01-03: 7 [IU] via SUBCUTANEOUS
  Administered 2020-01-03: 3 [IU] via SUBCUTANEOUS
  Administered 2020-01-03: 4 [IU] via SUBCUTANEOUS
  Filled 2019-12-31: qty 0.2

## 2019-12-31 MED ORDER — FLUTICASONE-UMECLIDIN-VILANT 100-62.5-25 MCG/INH IN AEPB: 1.0000 | INHALATION_SPRAY | Freq: Every day | RESPIRATORY_TRACT | Status: DC

## 2019-12-31 MED ORDER — ROPINIROLE HCL 0.25 MG PO TABS
0.2500 mg | ORAL_TABLET | Freq: Every day | ORAL | Status: DC
  Administered 2019-12-31 – 2020-01-02 (×3): 0.25 mg via ORAL
  Filled 2019-12-31 (×4): qty 1

## 2019-12-31 NOTE — H&P (Signed)
History and Physical    Sean Saunders [id number] DOB: 1946/10/24 DOA: 12/31/2019  PCP: Martinique, Betty G, MD  Patient coming from: Home  I have personally briefly reviewed patient's old medical records in [place]  Chief Complaint: Abd pain  HPI: Sean Saunders is a 74 y.o. male with medical history significant of DM2, HTN, PAD s/p aorto-bifem bypass, stroke.  Patient presents to ED with 3 day h/o abd pain.  Epigastric now radiating to RUQ.  Some nausea, no vomiting.  Worse with eating.  No diarrhea, no urinary symptoms, no h/o same.   ED Course: Mild acute pancreatitis with lipase of 63, no complications on CT scan.  Does have cholelithiasis though CBD normal diameter.  LFTs also nl.  WBC nl, no SIRS.  BUN 35, Creat 1.69, unknown baseline but has CKD stage 3 listed in chart.  No EtOH use.   Review of Systems: As per HPI, otherwise all review of systems negative.  Past Medical History:  Diagnosis Date   Arthritis    Bronchitis    hx of   COPD (chronic obstructive pulmonary disease) (HCC)    Diabetes mellitus    DVT (deep venous thrombosis) (HCC)    GERD (gastroesophageal reflux disease)    Hyperlipidemia    Hypertension    Hypothyroidism    Onychomycosis 06/07/2013   Stroke [facility])    TIA's 01/05/11-01/08/11-01/26/11   Thyroid disease    TIA (transient ischemic attack) 08/03/2011   Left sided weakness. 3 episodes Feb-March   TIA (transient ischemic attack)    x 3    Past Surgical History:  Procedure Laterality Date   ABDOMINAL AORTAGRAM N/A 06/04/2012   Procedure: ABDOMINAL Maxcine Ham;  Surgeon: Angelia Mould, MD;  Location: [facility] C[facility] LAB;  Service: Cardiovascular;  Laterality: N/A;   APPENDECTOMY     COLONOSCOPY     FEMORAL-POPLITEAL BYPASS GRAFT  07/05/2012   Procedure: BYPASS GRAFT FEMORAL-POPLITEAL ARTERY;  Surgeon: Angelia Mould, MD;  Location: [facility];  Service: Vascular;  Laterality: Left;  Revision Left fem/pop w/  artery patch angioplasty utilizing left GSV graft and extenion to tib/per trunk w/right GSV and interop arteriogram   JOINT REPLACEMENT  2011   -Hip   NASAL SINUS SURGERY     PR VEIN BYPASS GRAFT,AORTO-FEM-POP     PR VEIN BYPASS GRAFT,AORTO-FEM-POP  08-01-11   (L) Fem-pop BP   toe nail     TONSILLECTOMY       reports that he quit smoking about 18 months ago. His smoking use included cigarettes. He has a 25.00 pack-year smoking history. He has never used smokeless tobacco. He reports current alcohol use. He reports that he does not use drugs.  Allergies  Allergen Reactions   Eszopiclone Itching   Niaspan [Niacin Er] Nausea And Vomiting and Rash    Family History  Problem Relation Age of Onset   Stroke Mother    Diabetes Mother    Other Father        alzheimers   Diabetes Father    Stroke Father    Diabetes Sister      Prior to Admission medications   Medication Sig Start Date End Date Taking? Authorizing Provider  allopurinol (ZYLOPRIM) 100 MG tablet Take 100 mg by mouth 2 (two) times daily.   Yes [provider]  amLODipine (NORVASC) 10 MG tablet Take 10 mg by mouth daily.   Yes [provider]  clopidogrel (PLAVIX) 75 MG tablet Take 75 mg by mouth  daily.     Yes [provider]  fluticasone (FLONASE) 50 MCG/ACT nasal spray Place 2 sprays into both nostrils daily. 08/07/19  Yes Martinique, Betty G, MD  furosemide (LASIX) 20 MG tablet Take 20 mg by mouth daily.    Yes [provider]  hydrALAZINE (APRESOLINE) 25 MG tablet Take 25 mg by mouth 2 (two) times daily.   Yes [provider]  hydrochlorothiazide (HYDRODIURIL) 25 MG tablet Take 25 mg by mouth 2 (two) times daily.   Yes [provider]  insulin regular human CONCENTRATED (HUMULIN R) 500 UNIT/ML SOLN injection Inject 40-80 Units into the skin See admin instructions. 70units in am 40units at lunch 80units at dinner   Yes [provider]   levothyroxine (SYNTHROID, LEVOTHROID) 150 MCG tablet Take 150 mcg by mouth daily before breakfast.   Yes [provider]  lisinopril (ZESTRIL) 40 MG tablet Take 40 mg by mouth daily.   Yes [provider]  metFORMIN (GLUCOPHAGE) 500 MG tablet Take 500 mg by mouth 2 (two) times daily with a meal.   Yes [provider]  Multiple Vitamin (MULTIVITAMIN WITH MINERALS) TABS tablet Take 1 tablet by mouth daily.   Yes [provider]  rOPINIRole (REQUIP) 0.25 MG tablet Take 0.25 mg by mouth at bedtime.   Yes [provider]  rosuvastatin (CRESTOR) 20 MG tablet Take 20 mg by mouth daily.   Yes [provider]  tamsulosin (FLOMAX) 0.4 MG CAPS capsule Take 0.4 mg by mouth.   Yes [provider]  TRELEGY ELLIPTA 100-62.5-25 MCG/INH AEPB INHALE 1 PUFF INTO THE LUNGS DAILY Patient taking differently: Inhale 1 puff into the lungs daily.  04/16/19  Yes Collene Gobble, MD    Physical Exam: Vitals:   12/31/19 1708 12/31/19 2044  BP: (!) 154/90 118/88  Pulse: 94 77  Resp: 18 12  Temp: 98.3 F (36.8 C)   TempSrc: Oral   SpO2: 100% 99%    Constitutional: NAD, calm, comfortable Eyes: PERRL, lids and conjunctivae normal ENMT: Mucous membranes are moist. Posterior pharynx clear of any exudate or lesions.Normal dentition.  Neck: normal, supple, no masses, no thyromegaly Respiratory: clear to auscultation bilaterally, no wheezing, no crackles. Normal respiratory effort. No accessory muscle use.  Cardiovascular: Regular rate and rhythm, no murmurs / rubs / gallops. No extremity edema. 2+ pedal pulses. No carotid bruits.  Abdomen: Epigastric and RUQ TTP, no masses palpated. No hepatosplenomegaly. Bowel sounds positive.  Musculoskeletal: no clubbing / cyanosis. No joint deformity upper and lower extremities. Good ROM, no contractures. Normal muscle tone.  Skin: no rashes, lesions, ulcers. No induration Neurologic: CN 2-12 grossly intact. Sensation  intact, DTR normal. Strength 5/5 in all 4.  Psychiatric: Normal judgment and insight. Alert and oriented x 3. Normal mood.    Labs on Admission: I have personally reviewed following labs and imaging studies  CBC: Recent Labs  Lab 12/31/19 1718  WBC 10.5  HGB 13.3  HCT 42.4  MCV 91.8  PLT 123XX123   Basic Metabolic Panel: Recent Labs  Lab 12/31/19 1718  NA 137  K 4.6  CL 102  CO2 25  GLUCOSE 193*  BUN 35*  CREATININE 1.69*  CALCIUM 10.4*   GFR: CrCl cannot be calculated (Unknown ideal weight.). Liver Function Tests: Recent Labs  Lab 12/31/19 1718  AST 18  ALT 24  ALKPHOS 89  BILITOT 0.5  PROT 7.2  ALBUMIN 4.4   Recent Labs  Lab 12/31/19 1718  LIPASE 63*  No results for input(s): AMMONIA in the last 168 hours. Coagulation Profile: No results for input(s): INR, PROTIME in the last 168 hours. Cardiac Enzymes: No results for input(s): CKTOTAL, CKMB, CKMBINDEX, TROPONINI in the last 168 hours. BNP (last 3 results) No results for input(s): PROBNP in the last 8760 hours. HbA1C: No results for input(s): HGBA1C in the last 72 hours. CBG: No results for input(s): GLUCAP in the last 168 hours. Lipid Profile: No results for input(s): CHOL, HDL, LDLCALC, TRIG, CHOLHDL, LDLDIRECT in the last 72 hours. Thyroid Function Tests: No results for input(s): TSH, T4TOTAL, FREET4, T3FREE, THYROIDAB in the last 72 hours. Anemia Panel: No results for input(s): VITAMINB12, FOLATE, FERRITIN, TIBC, IRON, RETICCTPCT in the last 72 hours. Urine analysis:    Component Value Date/Time   COLORURINE YELLOW 07/03/2012 [place] 07/03/2012 0937   LABSPEC 1.022 07/03/2012 0937   PHURINE 5.5 07/03/2012 0937   GLUCOSEU NEGATIVE 07/03/2012 0937   HGBUR NEGATIVE 07/03/2012 0937   BILIRUBINUR NEGATIVE 07/03/2012 0937   KETONESUR NEGATIVE 07/03/2012 0937   PROTEINUR 30 (A) 07/03/2012 0937   UROBILINOGEN 0.2 07/03/2012 0937   NITRITE NEGATIVE 07/03/2012 0937   LEUKOCYTESUR  NEGATIVE 07/03/2012 0937    Radiological Exams on Admission: DG Chest 2 View  Result Date: 12/31/2019 CLINICAL DATA:  Chest pain. EXAM: CHEST - 2 VIEW COMPARISON:  September 03, 2019 FINDINGS: The heart size and mediastinal contours are within normal limits. Both lungs are clear. The visualized skeletal structures are unremarkable. IMPRESSION: No active cardiopulmonary disease. Electronically Signed   By: Virgina Norfolk M.D.   On: 12/31/2019 17:32   CT Abdomen Pelvis W Contrast  Result Date: 12/31/2019 CLINICAL DATA:  74 year old male with abdominal pain and distention. EXAM: CT ABDOMEN AND PELVIS WITH CONTRAST TECHNIQUE: Multidetector CT imaging of the abdomen and pelvis was performed using the standard protocol following bolus administration of intravenous contrast. CONTRAST:  47mL OMNIPAQUE IOHEXOL 300 MG/ML  SOLN COMPARISON:  None. FINDINGS: Evaluation of this exam is limited due to respiratory motion artifact. Lower chest: The visualized lung bases are clear. Coronary vascular calcification of the RCA noted. No intra-abdominal free air or free fluid. Hepatobiliary: Apparent mild fatty infiltration of the liver. No intrahepatic biliary ductal dilatation. There multiple stones within the gallbladder. No pericholecystic fluid or evidence of acute cholecystitis by CT. Pancreas: There is mild haziness and inflammatory changes of the head and uncinate process of the pancreas most consistent with acute pancreatitis. Correlation with pancreatic enzymes recommended. No drainable fluid collection/abscess or pseudocyst. No dilatation of the main pancreatic duct. Spleen: Normal in size without focal abnormality. Adrenals/Urinary Tract: The adrenal glands are unremarkable. There is no hydronephrosis on either side. There is symmetric enhancement and excretion of contrast by both kidneys. Bilateral subcentimeter hypodense lesions are not characterized on this CT. There is a 15 mm indeterminate low attenuating  lesion in the interpolar aspect of the right kidney. Further evaluation with ultrasound on a nonemergent/outpatient basis recommended. The visualized ureters and urinary bladder appear unremarkable Stomach/Bowel: There is sigmoid diverticulosis and scattered colonic diverticula without active inflammatory changes. Several normal caliber fecalized loops of small bowel in the mid abdomen may represent increased transit time or small intestinal bacterial overgrowth. Clinical correlation is recommended. There is no bowel obstruction. Appendectomy. Vascular/Lymphatic: Advanced aortoiliac atherosclerotic disease. There is occlusion of the distal abdominal aorta status post prior aortobifemoral bypass graft. The graft appears patent as visualized. The IVC is unremarkable. No portal venous gas. There is no adenopathy.  Reproductive: Enlarged prostate gland measuring approximately 7 cm in transverse axial dimension. Other: Induration of the subcutaneous soft tissues of the anterior abdominal wall, likely related to subcutaneous injections. No fluid collection. Musculoskeletal: Total right hip arthroplasty. No acute osseous pathology. IMPRESSION: 1. Acute pancreatitis.  No abscess or pseudocyst. 2. Cholelithiasis. 3. Diverticulosis.  No bowel obstruction or active inflammation. 4.  Aortic Atherosclerosis (ICD10-I70.0). 5. Occlusion of the distal abdominal aorta status post prior aortobifemoral bypass graft. 6. Enlarged prostate gland. Electronically Signed   By: Anner Crete M.D.   On: 12/31/2019 21:22    EKG: Independently reviewed.  Assessment/Plan Principal Problem:   Acute pancreatitis Active Problems:   Peripheral vascular disease, unspecified ([place])   Type 2 diabetes mellitus with diabetic neuropathy, unspecified ([place])   Hypertension with heart disease   CKD (chronic kidney disease), stage III   Cholelithiasis    1. Acute pancreatitis - 1. Getting RUQ [place] to take a better look, does have  cholelithiasis, though no CBD dilation on CT, no LFT elevation. 2. Also checking Triglyceride level 3. No EtOH use 4. NPO 5. IVF: NS at 125 cc/hr 6. Morphine PRN pain 7. Repeat CMP in AM 2. DM2 - 1. Holding home insulin, takes 190u of U500 daily it looks like. 2. Putting on resistant scale SSI Q4H while NPO 3. Also hold metformin 3. CKD stage 3 - 1. Unclear baseline 2. Repeat BMP in AM 3. Hold lisinopril, HCTZ, and lasix for the moment 4. HTN - 1. Holding lisinopril, HCTZ, and lasix 2. Continue amlodipine, hydralazine 5. PAD - 1. Continue plavix, crestor  DVT prophylaxis: Lovenox Code Status: Full Family Communication: No family in room Disposition Plan: Home after admit Consults called: None Admission status: Place in 36   Allyiah Gartner, [facility]  How to contact the [facility] Attending or Consulting provider [place] or covering provider during after hours [place], for this patient?  1. Check the care team in [facility] and look for a) attending/consulting TRH provider listed and b) the [facility] team listed 2. Log into [email or web address]  Amion Physician Scheduling and messaging for groups and whole hospitals  On call and physician scheduling software for group practices, residents, hospitalists and other medical providers for call, clinic, rotation and shift schedules. OnCall Enterprise is a hospital-wide system for scheduling doctors and paging doctors on call. EasyPlot is for scientific plotting and data analysis.  [email or web address]  and use [facility]'s universal password to access. If you do not have the password, please contact the hospital operator.  3. Locate the [facility] provider you are looking for under [facility] and page to a number that you can be directly reached. 4. If you still have difficulty reaching the provider, please page the [facility] (Director on Call) for the Hospitalists listed on amion for assistance.  12/31/2019, 9:58 PM

## 2019-12-31 NOTE — ED Provider Notes (Signed)
[facility] DEPT Provider Note   CSN: [id number] Arrival date & time: 12/31/19  1653     History Chief Complaint  Patient presents with  . Abdominal Pain  . Chest Pain    Sean Saunders is a 74 y.o. male.  74 year old male presents with 3 days of abdominal pain started epigastric and now radiates to right upper quadrant.  Does have a prior history of arterial bypass in his lower extremities.  Denies any anginal or CHF symptoms.  Symptoms are worse when he eats sometimes relieved when he belches.  He has had nausea but no vomiting.  Some abdominal distention.  No diarrhea.  No urinary symptoms.  No prior history of same.  Called his doctor and was told to come here.        Past Medical History:  Diagnosis Date  . Arthritis   . Bronchitis    hx of  . COPD (chronic obstructive pulmonary disease) ([place])   . Diabetes mellitus   . DVT (deep venous thrombosis) ([place])   . GERD (gastroesophageal reflux disease)   . Hyperlipidemia   . Hypertension   . Hypothyroidism   . Onychomycosis 06/07/2013  . Stroke [facility])    TIA's 01/05/11-01/08/11-01/26/11  . Thyroid disease   . TIA (transient ischemic attack) 08/03/2011   Left sided weakness. 3 episodes Feb-March  . TIA (transient ischemic attack)    x 3    Patient Active Problem List   Diagnosis Date Noted  . Chronic sinusitis 09/03/2019  . CKD (chronic kidney disease), stage III 09/17/2018  . Meningioma, cerebral ([place]) 09/17/2018  . Vestibular schwannoma ([place]) 09/17/2018  . Rhinitis, allergic 09/25/2017  . Tobacco use disorder 09/11/2017  . COPD (chronic obstructive pulmonary disease) ([place]) 09/10/2017  . Hypertension with heart disease 09/04/2017  . Pain in lower limb 03/21/2014  . Type 2 diabetes mellitus with diabetic neuropathy, unspecified ([place]) 03/21/2014  . PVD (peripheral vascular disease) ([place]) 12/11/2013  . Occlusion and stenosis of carotid artery without mention of cerebral infarction  12/11/2013  . Onychomycosis 06/07/2013  . Pain in joint, ankle and foot 06/07/2013  . Callus of foot 06/07/2013  . Peripheral vascular disease, unspecified ([place]) 05/29/2013  . Aftercare following surgery of the circulatory system, [place] 05/29/2013  . Other postoperative infection 11/13/2012  . Neuropathic pain of lower extremity 11/23/2011  . Atherosclerosis of native arteries of the extremities with intermittent claudication 10/12/2011    Past Surgical History:  Procedure Laterality Date  . ABDOMINAL AORTAGRAM N/A 06/04/2012   Procedure: ABDOMINAL Maxcine Ham;  Surgeon: Angelia Mould, MD;  [facility] CATH LAB;  Service: Cardiovascular;  Laterality: N/A;  . APPENDECTOMY    . COLONOSCOPY    . FEMORAL-POPLITEAL BYPASS GRAFT  07/05/2012   Procedure: BYPASS GRAFT FEMORAL-POPLITEAL ARTERY;  Surgeon: Angelia Mould, MD;  Location: [facility] OR;  Service: Vascular;  Laterality: Left;  Revision Left fem/pop w/ artery patch angioplasty utilizing left GSV graft and extenion to tib/per trunk w/right GSV and interop arteriogram  . JOINT REPLACEMENT  2011   -Hip  . NASAL SINUS SURGERY    . PR VEIN BYPASS GRAFT,AORTO-FEM-POP    . PR VEIN BYPASS GRAFT,AORTO-FEM-POP  08-01-11   (L) Fem-pop BP  . toe nail    . TONSILLECTOMY         Family History  Problem Relation Age of Onset  . Stroke Mother   . Diabetes Mother   . Other Father        alzheimers  .  Diabetes Father   . Stroke Father   . Diabetes Sister     Social History   Tobacco Use  . Smoking status: Former Smoker    Packs/day: 1.00    Years: 25.00    Pack years: 25.00    Types: Cigarettes    Quit date: 06/05/2018    Years since quitting: 1.5  . Smokeless tobacco: Never Used  Substance Use Topics  . Alcohol use: Yes    Alcohol/week: 0.0 standard drinks    Comment: rare  . Drug use: No    Home Medications Prior to Admission medications   Medication Sig Start Date End Date Taking? Authorizing Provider  allopurinol  (ZYLOPRIM) 100 MG tablet Take 100 mg by mouth 2 (two) times daily.   Yes [provider]  amLODipine (NORVASC) 10 MG tablet Take 10 mg by mouth daily.   Yes [provider]  clopidogrel (PLAVIX) 75 MG tablet Take 75 mg by mouth daily.     Yes [provider]  fluticasone (FLONASE) 50 MCG/ACT nasal spray Place 2 sprays into both nostrils daily. 08/07/19  Yes [place], Betty G, MD  furosemide (LASIX) 20 MG tablet Take 20 mg by mouth daily.    Yes [provider]  hydrALAZINE (APRESOLINE) 25 MG tablet Take 25 mg by mouth 2 (two) times daily.   Yes [provider]  hydrochlorothiazide (HYDRODIURIL) 25 MG tablet Take 25 mg by mouth 2 (two) times daily.   Yes [provider]  insulin regular human CONCENTRATED (HUMULIN R) 500 UNIT/ML SOLN injection Inject 40-80 Units into the skin See admin instructions. 70units in am 40units at lunch 80units at dinner   Yes [provider]  levothyroxine (SYNTHROID, LEVOTHROID) 150 MCG tablet Take 150 mcg by mouth daily before breakfast.   Yes [provider]  lisinopril (ZESTRIL) 40 MG tablet Take 40 mg by mouth daily.   Yes [provider]  metFORMIN (GLUCOPHAGE) 500 MG tablet Take 500 mg by mouth 2 (two) times daily with a meal.   Yes [provider]  Multiple Vitamin (MULTIVITAMIN WITH MINERALS) TABS tablet Take 1 tablet by mouth daily.   Yes [provider]  rOPINIRole (REQUIP) 0.25 MG tablet Take 0.25 mg by mouth at bedtime.   Yes [provider]  rosuvastatin (CRESTOR) 20 MG tablet Take 20 mg by mouth daily.   Yes [provider]  tamsulosin (FLOMAX) 0.4 MG CAPS capsule Take 0.4 mg by mouth.   Yes [provider]  TRELEGY ELLIPTA 100-62.5-25 MCG/INH AEPB INHALE 1 PUFF INTO THE LUNGS DAILY Patient taking differently: Inhale 1 puff into the lungs daily.  04/16/19  Yes Collene Gobble, MD  amoxicillin-clavulanate (AUGMENTIN) 875-125 MG tablet  Take 1 tablet by mouth 2 (two) times daily. Patient not taking: Reported on 12/31/2019 09/03/19   Collene Gobble, MD  ipratropium (ATROVENT) 0.06 % nasal spray Place 2 sprays into both nostrils 4 (four) times daily. Patient not taking: Reported on 12/31/2019 08/07/19   [place], Betty G, MD  predniSONE (DELTASONE) 10 MG tablet Take 4 tablets for 3 days, 3 tablets for 3 days, 2 tablets for 3 days, 1 tablet for 3 days Patient not taking: Reported on 12/31/2019 09/03/19   Collene Gobble, MD    Allergies    Eszopiclone and Niaspan [niacin er]  Review of Systems   Review of Systems  All other systems reviewed and are negative.   Physical Exam Updated Vital Signs BP (!) 154/90  Pulse 94   Temp 98.3 F (36.8 C) (Oral)   Resp 18   SpO2 100%   Physical Exam Vitals and nursing note reviewed.  Constitutional:      General: He is not in acute distress.    Appearance: Normal appearance. He is well-developed. He is not toxic-appearing.  HENT:     Head: Normocephalic and atraumatic.  Eyes:     General: Lids are normal.     Conjunctiva/sclera: Conjunctivae normal.     Pupils: Pupils are equal, round, and reactive to light.  Neck:     Thyroid: No thyroid mass.     Trachea: No tracheal deviation.  Cardiovascular:     Rate and Rhythm: Normal rate and regular rhythm.     Heart sounds: Normal heart sounds. No murmur. No gallop.   Pulmonary:     Effort: Pulmonary effort is normal. No respiratory distress.     Breath sounds: Normal breath sounds. No stridor. No decreased breath sounds, wheezing, rhonchi or rales.  Abdominal:     General: Bowel sounds are normal. There is no distension.     Palpations: Abdomen is soft.     Tenderness: There is abdominal tenderness in the right upper quadrant and epigastric area. There is guarding. There is no rebound.    Musculoskeletal:        General: No tenderness. Normal range of motion.     Cervical back: Normal range of motion and neck supple.  Skin:     General: Skin is warm and dry.     Findings: No abrasion or rash.  Neurological:     Mental Status: He is alert and oriented to person, place, and time.     GCS: GCS eye subscore is 4. GCS verbal subscore is 5. GCS motor subscore is 6.     Cranial Nerves: No cranial nerve deficit.     Sensory: No sensory deficit.  Psychiatric:        Speech: Speech normal.        Behavior: Behavior normal.     ED Results / Procedures / Treatments   Labs (all labs ordered are listed, but only abnormal results are displayed) Labs Reviewed  LIPASE, BLOOD - Abnormal; Notable for the following components:      Result Value   Lipase 63 (*)    All other components within normal limits  COMPREHENSIVE METABOLIC PANEL - Abnormal; Notable for the following components:   Glucose, Bld 193 (*)    BUN 35 (*)    Creatinine, Ser 1.69 (*)    Calcium 10.4 (*)    GFR calc non Af Amer 39 (*)    GFR calc Af Amer 45 (*)    All other components within normal limits  CBC  URINALYSIS, ROUTINE W REFLEX MICROSCOPIC  TROPONIN I (HIGH SENSITIVITY)  TROPONIN I (HIGH SENSITIVITY)    EKG EKG Interpretation  Date/Time:  Tuesday December 31 2019 17:04:02 EST Ventricular Rate:  96 PR Interval:    QRS Duration: 99 QT Interval:  356 QTC Calculation: 450 R Axis:   46 Text Interpretation: Sinus rhythm Atrial premature complex Low voltage, extremity and precordial leads Nonspecific T abnormalities, lateral leads No STEMI Confirmed by Nanda Quinton [phone]) on 12/31/2019 5:06:25 PM   Radiology DG Chest 2 View  Result Date: 12/31/2019 CLINICAL DATA:  Chest pain. EXAM: CHEST - 2 VIEW COMPARISON:  September 03, 2019 FINDINGS: The heart size and mediastinal contours are within normal limits. Both lungs are clear. The visualized  skeletal structures are unremarkable. IMPRESSION: No active cardiopulmonary disease. Electronically Signed   By: Virgina Norfolk M.D.   On: 12/31/2019 17:32    Procedures Procedures (including  critical care time)  Medications Ordered in ED Medications  morphine 4 MG/ML injection 4 mg (has no administration in time range)    ED Course  I have reviewed the triage vital signs and the nursing notes.  Pertinent labs & imaging results that were available during my care of the patient were reviewed by me and considered in my medical decision making (see chart for details).    MDM Rules/Calculators/A&P                      Patient with evidence of acute pancreatitis on CT.  Medicated for pain he continues to be uncomfortable.  Will remedicate here and admit for observation for pancreatitis. Final Clinical Impression(s) / ED Diagnoses Final diagnoses:  None    Rx / DC Orders ED Discharge Orders    None       Lacretia Leigh, MD 12/31/19 2138

## 2019-12-31 NOTE — Telephone Encounter (Signed)
Pt reports abdominal pain, "Begins at 1 in. above navel, up through stomach to right chest" onset 2 days ago. States constant 8- 9/10 today. States "Kept me awake all night last night, I'm miserable."  Reports has tried Tums, ineffective. Denies fever, no diarrhea, states "Felt like I might vomit but did not." Pt sounds distressed during call. States again "I really don't feel well." Directed to ED, declined initially. Called practice to see if virtual available today, no availability. Reiterated ED visit. Pt states will follow disposition.   Reason for Disposition . [1] SEVERE pain (e.g., excruciating) AND [2] present > 1 hour  Answer Assessment - Initial Assessment Questions 1. LOCATION: "Where does it hurt?"      "About 1 inch above navel through stomach" 2. RADIATION: "Does the pain shoot anywhere else?" (e.g., chest, back)    "Travels up stomach through right chest, almost to shoulder" 3. ONSET: "When did the pain begin?" (Minutes, hours or days ago)      2 days ago 4. SUDDEN: "Gradual or sudden onset?"     Gradually, worse today 5. PATTERN "Does the pain come and go, or is it constant?"    - If constant: "Is it getting better, staying the same, or worsening?"      (Note: Constant means the pain never goes away completely; most serious pain is constant and it progresses)     - If intermittent: "How long does it last?" "Do you have pain now?"     (Note: Intermittent means the pain goes away completely between bouts)     Constant 6. SEVERITY: "How bad is the pain?"  (e.g., Scale 1-10; mild, moderate, or severe)    - MILD (1-3): doesn't interfere with normal activities, abdomen soft and not tender to touch     - MODERATE (4-7): interferes with normal activities or awakens from sleep, tender to touch     - SEVERE (8-10): excruciating pain, doubled over, unable to do any normal activities       8-9/10 7. RECURRENT SYMPTOM: "Have you ever had this type of abdominal pain before?" If so, ask:  "When was the last time?" and "What happened that time?"      NO 8. CAUSE: "What do you think is causing the abdominal pain?"     Reflux 9. RELIEVING/AGGRAVATING FACTORS: "What makes it better or worse?" (e.g., movement, antacids, bowel movement)     Eating makes it worse" Burning pain and pressure 10. OTHER SYMPTOMS: "Has there been any vomiting, diarrhea, constipation, or urine problems?"       No, nausea  Protocols used: ABDOMINAL PAIN - MALE-A-AH

## 2019-12-31 NOTE — ED Triage Notes (Signed)
Pt c/o abd pains that radiates to right chest for 3 days. Some nausea, belching. Denies vomiting. Was advised to Ed from PCP. Pt tried Tums and tea but had no relief.

## 2020-01-01 DIAGNOSIS — Z20822 Contact with and (suspected) exposure to covid-19: Secondary | ICD-10-CM | POA: Diagnosis present

## 2020-01-01 DIAGNOSIS — Z79890 Hormone replacement therapy (postmenopausal): Secondary | ICD-10-CM | POA: Diagnosis not present

## 2020-01-01 DIAGNOSIS — M1990 Unspecified osteoarthritis, unspecified site: Secondary | ICD-10-CM | POA: Diagnosis present

## 2020-01-01 DIAGNOSIS — Z794 Long term (current) use of insulin: Secondary | ICD-10-CM | POA: Diagnosis not present

## 2020-01-01 DIAGNOSIS — K859 Acute pancreatitis without necrosis or infection, unspecified: Secondary | ICD-10-CM | POA: Diagnosis present

## 2020-01-01 DIAGNOSIS — Z87891 Personal history of nicotine dependence: Secondary | ICD-10-CM | POA: Diagnosis not present

## 2020-01-01 DIAGNOSIS — Z8673 Personal history of transient ischemic attack (TIA), and cerebral infarction without residual deficits: Secondary | ICD-10-CM | POA: Diagnosis not present

## 2020-01-01 DIAGNOSIS — Z888 Allergy status to other drugs, medicaments and biological substances status: Secondary | ICD-10-CM | POA: Diagnosis not present

## 2020-01-01 DIAGNOSIS — E1151 Type 2 diabetes mellitus with diabetic peripheral angiopathy without gangrene: Secondary | ICD-10-CM | POA: Diagnosis present

## 2020-01-01 DIAGNOSIS — Z7902 Long term (current) use of antithrombotics/antiplatelets: Secondary | ICD-10-CM | POA: Diagnosis not present

## 2020-01-01 DIAGNOSIS — N400 Enlarged prostate without lower urinary tract symptoms: Secondary | ICD-10-CM | POA: Diagnosis present

## 2020-01-01 DIAGNOSIS — N183 Chronic kidney disease, stage 3 unspecified: Secondary | ICD-10-CM

## 2020-01-01 DIAGNOSIS — E785 Hyperlipidemia, unspecified: Secondary | ICD-10-CM | POA: Diagnosis present

## 2020-01-01 DIAGNOSIS — I739 Peripheral vascular disease, unspecified: Secondary | ICD-10-CM | POA: Diagnosis not present

## 2020-01-01 DIAGNOSIS — J449 Chronic obstructive pulmonary disease, unspecified: Secondary | ICD-10-CM | POA: Diagnosis present

## 2020-01-01 DIAGNOSIS — Z823 Family history of stroke: Secondary | ICD-10-CM | POA: Diagnosis not present

## 2020-01-01 DIAGNOSIS — Z820 Family history of epilepsy and other diseases of the nervous system: Secondary | ICD-10-CM | POA: Diagnosis not present

## 2020-01-01 DIAGNOSIS — E039 Hypothyroidism, unspecified: Secondary | ICD-10-CM | POA: Diagnosis present

## 2020-01-01 DIAGNOSIS — Z833 Family history of diabetes mellitus: Secondary | ICD-10-CM | POA: Diagnosis not present

## 2020-01-01 DIAGNOSIS — I119 Hypertensive heart disease without heart failure: Secondary | ICD-10-CM | POA: Diagnosis not present

## 2020-01-01 DIAGNOSIS — E1122 Type 2 diabetes mellitus with diabetic chronic kidney disease: Secondary | ICD-10-CM | POA: Diagnosis present

## 2020-01-01 DIAGNOSIS — E1140 Type 2 diabetes mellitus with diabetic neuropathy, unspecified: Secondary | ICD-10-CM

## 2020-01-01 DIAGNOSIS — K219 Gastro-esophageal reflux disease without esophagitis: Secondary | ICD-10-CM | POA: Diagnosis present

## 2020-01-01 DIAGNOSIS — Z79899 Other long term (current) drug therapy: Secondary | ICD-10-CM | POA: Diagnosis not present

## 2020-01-01 DIAGNOSIS — K8020 Calculus of gallbladder without cholecystitis without obstruction: Secondary | ICD-10-CM

## 2020-01-01 DIAGNOSIS — I1310 Hypertensive heart and chronic kidney disease without heart failure, with stage 1 through stage 4 chronic kidney disease, or unspecified chronic kidney disease: Secondary | ICD-10-CM | POA: Diagnosis present

## 2020-01-01 LAB — COMPREHENSIVE METABOLIC PANEL
ALT: 20 U/L (ref 0–44)
AST: 17 U/L (ref 15–41)
Albumin: 3.7 g/dL (ref 3.5–5.0)
Alkaline Phosphatase: 79 U/L (ref 38–126)
Anion gap: 8 (ref 5–15)
BUN: 30 mg/dL — ABNORMAL HIGH (ref 8–23)
CO2: 22 mmol/L (ref 22–32)
Calcium: 9.3 mg/dL (ref 8.9–10.3)
Chloride: 107 mmol/L (ref 98–111)
Creatinine, Ser: 1.39 mg/dL — ABNORMAL HIGH (ref 0.61–1.24)
GFR calc Af Amer: 57 mL/min — ABNORMAL LOW (ref >60)
GFR calc non Af Amer: 50 mL/min — ABNORMAL LOW (ref >60)
Glucose, Bld: 187 mg/dL — ABNORMAL HIGH (ref 70–99)
Potassium: 4.4 mmol/L (ref 3.5–5.1)
Sodium: 137 mmol/L (ref 135–145)
Total Bilirubin: 0.4 mg/dL (ref 0.3–1.2)
Total Protein: 6 g/dL — ABNORMAL LOW (ref 6.5–8.1)

## 2020-01-01 LAB — CBC
HCT: 38.6 % — ABNORMAL LOW (ref 39.0–52.0)
Hemoglobin: 12.4 g/dL — ABNORMAL LOW (ref 13.0–17.0)
MCH: 29.1 pg (ref 26.0–34.0)
MCHC: 32.1 g/dL (ref 30.0–36.0)
MCV: 90.6 fL (ref 80.0–100.0)
Platelets: 175 10*3/uL (ref 150–400)
RBC: 4.26 MIL/uL (ref 4.22–5.81)
RDW: 13.8 % (ref 11.5–15.5)
WBC: 8 10*3/uL (ref 4.0–10.5)
nRBC: 0 % (ref 0.0–0.2)

## 2020-01-01 LAB — CBG MONITORING, ED
Glucose-Capillary: 184 mg/dL — ABNORMAL HIGH (ref 70–99)
Glucose-Capillary: 155 mg/dL — ABNORMAL HIGH (ref 70–99)
Glucose-Capillary: 181 mg/dL — ABNORMAL HIGH (ref 70–99)
Glucose-Capillary: 168 mg/dL — ABNORMAL HIGH (ref 70–99)
Glucose-Capillary: 175 mg/dL — ABNORMAL HIGH (ref 70–99)

## 2020-01-01 LAB — SARS CORONAVIRUS 2 (TAT 6-24 HRS): SARS Coronavirus 2: NEGATIVE

## 2020-01-01 LAB — HEMOGLOBIN A1C
Hgb A1c MFr Bld: 7.7 % — ABNORMAL HIGH (ref 4.8–5.6)
Mean Plasma Glucose: 174.29 mg/dL

## 2020-01-01 LAB — TRIGLYCERIDES: Triglycerides: 194 mg/dL — ABNORMAL HIGH (ref <150)

## 2020-01-01 MED ORDER — FLUTICASONE FUROATE-VILANTEROL 100-25 MCG/INH IN AEPB
1.0000 | INHALATION_SPRAY | Freq: Every day | RESPIRATORY_TRACT | Status: DC
  Administered 2020-01-01 – 2020-01-03 (×3): 1 via RESPIRATORY_TRACT
  Filled 2020-01-01: qty 28

## 2020-01-01 MED ORDER — UMECLIDINIUM BROMIDE 62.5 MCG/INH IN AEPB
1.0000 | INHALATION_SPRAY | Freq: Every day | RESPIRATORY_TRACT | Status: DC
  Administered 2020-01-01 – 2020-01-03 (×3): 1 via RESPIRATORY_TRACT
  Filled 2020-01-01: qty 7

## 2020-01-01 NOTE — Progress Notes (Signed)
PROGRESS NOTE  Sean Saunders [id number] DOB: [birth year] DOA: 12/31/2019 PCP: [place], Betty G, MD  HPI/Recap of past 24 hours: HPI from Dr Drucie Ip is a 74 y.o. male with medical history significant of DM2, HTN, PAD s/p aorto-bifem bypass, stroke, presents to ED with 3 day h/o abd pain in the epigastric region, some nausea, no vomiting. Worse with eating. In the ED, lipase of 63, acute pancreatitis on CT scan.  Does have cholelithiasis though CBD normal diameter. LFTs also nl.  WBC nl, no SIRS. BUN 35, Creat 1.69, unknown baseline but has CKD stage 3 listed in chart. No EtOH use.  Patient admitted for further management.    Today, patient still reports epigastric pain, denies any nausea/vomiting.  Patient denies any fever/chills, chest pain, shortness of breath, diarrhea.  Assessment/Plan: Principal Problem:   Acute pancreatitis Active Problems:   Peripheral vascular disease, unspecified ([place])   Type 2 diabetes mellitus with diabetic neuropathy, unspecified ([place])   Hypertension with heart disease   CKD (chronic kidney disease), stage III   Cholelithiasis   Acute pancreatitis Lipase 63 Triglycerides 194 Denies any alcohol use, ??HCTZ use, hold for now CT abdomen/pelvis showed acute pancreatitis RUQ ultrasound showed cholelithiasis without sonographic evidence for acute cholecystitis or biliary dilatation IV fluids Supportive management, n.p.o.  CKD stage III No recent baseline Continue to hold lisinopril, hydrochlorothiazide, Lasix for now May consider stopping HCTZ prior to d/c 2/2 CKD Daily BMP  Diabetes mellitus type 2 Last A1c 7.7, uncontrolled SSI, Accu-Cheks, hypoglycemic protocol Hold home regimen  Hypertension Continue home amlodipine, hydralazine Continue to hold lisinopril, Lasix, HCTZ  PAD Continue Plavix, Crestor  Hypothyroidism Continue Synthroid  BPH Continue Flomax         Malnutrition Type:      Malnutrition  Characteristics:      Nutrition Interventions:       Estimated body mass index is 31.33 kg/m as calculated from the following:   Height as of 09/03/19: 6' (1.829 m).   Weight as of 09/03/19: 104.8 kg.     Code Status: Full  Family Communication: None at bedside  Disposition Plan: Likely home   Consultants:  None  Procedures:  None  Antimicrobials:  None  DVT prophylaxis: Lovenox   Objective: Vitals:   01/01/20 1130 01/01/20 1200 01/01/20 1300 01/01/20 1330  BP: 122/61 135/83 (!) 123/59 (!) 136/52  Pulse: 77 77 95 84  Resp: 13 14 14 15   Temp:      TempSrc:      SpO2: 97% 96% 95% 96%    Intake/Output Summary (Last 24 hours) at 01/01/2020 1417 Last data filed at 01/01/2020 [id number] Gross per 24 hour  Intake --  Output 700 ml  Net -700 ml   There were no vitals filed for this visit.  Exam:  General: NAD   Cardiovascular: S1, S2 present  Respiratory: CTAB  Abdomen: Soft, +tender, nondistended, bowel sounds present  Musculoskeletal: No bilateral pedal edema noted  Skin: Normal  Psychiatry: Normal mood    Data Reviewed: CBC: Recent Labs  Lab 12/31/19 1718 01/01/20 0424  WBC 10.5 8.0  HGB 13.3 12.4*  HCT 42.4 38.6*  MCV 91.8 90.6  PLT 216 0000000   Basic Metabolic Panel: Recent Labs  Lab 12/31/19 1718 01/01/20 0424  NA 137 137  K 4.6 4.4  CL 102 107  CO2 25 22  GLUCOSE 193* 187*  BUN 35* 30*  CREATININE 1.69* 1.39*  CALCIUM 10.4* 9.3   GFR: CrCl  cannot be calculated (Unknown ideal weight.). Liver Function Tests: Recent Labs  Lab 12/31/19 1718 01/01/20 0424  AST 18 17  ALT 24 20  ALKPHOS 89 79  BILITOT 0.5 0.4  PROT 7.2 6.0*  ALBUMIN 4.4 3.7   Recent Labs  Lab 12/31/19 1718  LIPASE 63*   No results for input(s): AMMONIA in the last 168 hours. Coagulation Profile: No results for input(s): INR, PROTIME in the last 168 hours. Cardiac Enzymes: No results for input(s): CKTOTAL, CKMB, CKMBINDEX, TROPONINI in the last  168 hours. BNP (last 3 results) No results for input(s): PROBNP in the last 8760 hours. HbA1C: Recent Labs    12/31/19 1718  HGBA1C 7.7*   CBG: Recent Labs  Lab 12/31/19 2209 01/01/20 0151 01/01/20 0342 01/01/20 0817 01/01/20 1213  GLUCAP 228* 184* 155* 181* 168*   Lipid Profile: Recent Labs    12/31/19 1718 01/01/20 0424  TRIG 236* 194*   Thyroid Function Tests: No results for input(s): TSH, T4TOTAL, FREET4, T3FREE, THYROIDAB in the last 72 hours. Anemia Panel: No results for input(s): VITAMINB12, FOLATE, FERRITIN, TIBC, IRON, RETICCTPCT in the last 72 hours. Urine analysis:    Component Value Date/Time   COLORURINE YELLOW 12/31/2019 1711   APPEARANCEUR CLEAR 12/31/2019 1711   LABSPEC 1.035 (H) 12/31/2019 1711   PHURINE 5.0 12/31/2019 1711   GLUCOSEU NEGATIVE 12/31/2019 1711   HGBUR NEGATIVE 12/31/2019 1711   BILIRUBINUR NEGATIVE 12/31/2019 1711   KETONESUR NEGATIVE 12/31/2019 1711   PROTEINUR NEGATIVE 12/31/2019 1711   UROBILINOGEN 0.2 07/03/2012 0937   NITRITE NEGATIVE 12/31/2019 1711   LEUKOCYTESUR NEGATIVE 12/31/2019 1711   Sepsis Labs: @LABRCNTIP (procalcitonin:4,lacticidven:4)  ) Recent Results (from the past 240 hour(s))  SARS CORONAVIRUS 2 (TAT 6-24 HRS) Nasopharyngeal Nasopharyngeal Swab     Status: None   Collection Time: 12/31/19  9:39 PM   Specimen: Nasopharyngeal Swab  Result Value Ref Range Status   SARS Coronavirus 2 NEGATIVE NEGATIVE Final    Comment: (NOTE) SARS-CoV-2 target nucleic acids are NOT DETECTED. The SARS-CoV-2 RNA is generally detectable in upper and lower respiratory specimens during the acute phase of infection. Negative results do not preclude SARS-CoV-2 infection, do not rule out co-infections with other pathogens, and should not be used as the sole basis for treatment or other patient management decisions. Negative results must be combined with clinical observations, patient history, and epidemiological information. The  expected result is Negative. Fact Sheet for Patients: SugarRoll.be Fact Sheet for Healthcare Providers: https://www.woods-mathews.com/ This test is not yet approved or cleared by the [place] FDA and  has been authorized for detection and/or diagnosis of SARS-CoV-2 by FDA under an Emergency Use Authorization (EUA). This EUA will remain  in effect (meaning this test can be used) for the duration of the COVID-19 declaration under Section 56 4(b)(1) of the Act, 21 U.S.C. section 360bbb-3(b)(1), unless the authorization is terminated or revoked sooner. Performed at [facility], [place] [address] [place], [place] [address]       Studies: DG Chest 2 View  Result Date: 12/31/2019 CLINICAL DATA:  Chest pain. EXAM: CHEST - 2 VIEW COMPARISON:  September 03, 2019 FINDINGS: The heart size and mediastinal contours are within normal limits. Both lungs are clear. The visualized skeletal structures are unremarkable. IMPRESSION: No active cardiopulmonary disease. Electronically Signed   By: Virgina Norfolk M.D.   On: 12/31/2019 17:32   CT Abdomen Pelvis W Contrast  Result Date: 12/31/2019 CLINICAL DATA:  74 year old male with abdominal pain and distention. EXAM:  CT ABDOMEN AND PELVIS WITH CONTRAST TECHNIQUE: Multidetector CT imaging of the abdomen and pelvis was performed using the standard protocol following bolus administration of intravenous contrast. CONTRAST:  89mL OMNIPAQUE IOHEXOL 300 MG/ML  SOLN COMPARISON:  None. FINDINGS: Evaluation of this exam is limited due to respiratory motion artifact. Lower chest: The visualized lung bases are clear. Coronary vascular calcification of the RCA noted. No intra-abdominal free air or free fluid. Hepatobiliary: Apparent mild fatty infiltration of the liver. No intrahepatic biliary ductal dilatation. There multiple stones within the gallbladder. No pericholecystic fluid or evidence of acute cholecystitis by CT.  Pancreas: There is mild haziness and inflammatory changes of the head and uncinate process of the pancreas most consistent with acute pancreatitis. Correlation with pancreatic enzymes recommended. No drainable fluid collection/abscess or pseudocyst. No dilatation of the main pancreatic duct. Spleen: Normal in size without focal abnormality. Adrenals/Urinary Tract: The adrenal glands are unremarkable. There is no hydronephrosis on either side. There is symmetric enhancement and excretion of contrast by both kidneys. Bilateral subcentimeter hypodense lesions are not characterized on this CT. There is a 15 mm indeterminate low attenuating lesion in the interpolar aspect of the right kidney. Further evaluation with ultrasound on a nonemergent/outpatient basis recommended. The visualized ureters and urinary bladder appear unremarkable Stomach/Bowel: There is sigmoid diverticulosis and scattered colonic diverticula without active inflammatory changes. Several normal caliber fecalized loops of small bowel in the mid abdomen may represent increased transit time or small intestinal bacterial overgrowth. Clinical correlation is recommended. There is no bowel obstruction. Appendectomy. Vascular/Lymphatic: Advanced aortoiliac atherosclerotic disease. There is occlusion of the distal abdominal aorta status post prior aortobifemoral bypass graft. The graft appears patent as visualized. The IVC is unremarkable. No portal venous gas. There is no adenopathy. Reproductive: Enlarged prostate gland measuring approximately 7 cm in transverse axial dimension. Other: Induration of the subcutaneous soft tissues of the anterior abdominal wall, likely related to subcutaneous injections. No fluid collection. Musculoskeletal: Total right hip arthroplasty. No acute osseous pathology. IMPRESSION: 1. Acute pancreatitis.  No abscess or pseudocyst. 2. Cholelithiasis. 3. Diverticulosis.  No bowel obstruction or active inflammation. 4.  Aortic  Atherosclerosis (ICD10-I70.0). 5. Occlusion of the distal abdominal aorta status post prior aortobifemoral bypass graft. 6. Enlarged prostate gland. Electronically Signed   By: Anner Crete M.D.   On: 12/31/2019 21:22   US Abdomen Limited RUQ  Result Date: 12/31/2019 CLINICAL DATA:  Pancreatitis EXAM: ULTRASOUND ABDOMEN LIMITED RIGHT UPPER QUADRANT COMPARISON:  None. CT 12/31/2019 FINDINGS: Gallbladder: Multiple stones measuring up to 1.1 cm. Normal wall thickness. Negative sonographic Murphy Common bile duct: Diameter: 1.8 mm Liver: Increased hepatic echogenicity. No focal hepatic abnormality. Portal vein is patent on color Doppler imaging with normal direction of blood flow towards the liver. Other: None. IMPRESSION: 1. Cholelithiasis without sonographic evidence for acute cholecystitis or biliary dilatation 2. Echogenic liver consistent with steatosis and or hepatocellular disease Electronically Signed   By: Donavan Foil M.D.   On: 12/31/2019 22:17    Scheduled Meds: . allopurinol  100 mg Oral BID  . amLODipine  10 mg Oral Daily  . clopidogrel  75 mg Oral Daily  . enoxaparin (LOVENOX) injection  40 mg Subcutaneous Q24H  . fluticasone  2 spray Each Nare Daily  . fluticasone furoate-vilanterol  1 puff Inhalation Daily  . hydrALAZINE  25 mg Oral BID  . insulin aspart  0-20 Units Subcutaneous Q4H  . levothyroxine  150 mcg Oral Q0600  . multivitamin with minerals  1 tablet Oral Daily  .  rOPINIRole  0.25 mg Oral QHS  . rosuvastatin  20 mg Oral q1800  . tamsulosin  0.4 mg Oral QPC breakfast  . umeclidinium bromide  1 puff Inhalation Daily    Continuous Infusions: . sodium chloride 125 mL/hr at 01/01/20 0644     LOS: 0 days     Alma Friendly, MD [facility]  If 7PM-7AM, please contact night-coverage [email or web address] 01/01/2020, 2:17 PM

## 2020-01-01 NOTE — Telephone Encounter (Signed)
Pt went to ED

## 2020-01-02 LAB — CBC WITH DIFFERENTIAL/PLATELET
Abs Immature Granulocytes: 0.02 10*3/uL (ref 0.00–0.07)
Basophils Absolute: 0.1 10*3/uL (ref 0.0–0.1)
Basophils Relative: 1 %
Eosinophils Absolute: 0.2 10*3/uL (ref 0.0–0.5)
Eosinophils Relative: 2 %
HCT: 37.1 % — ABNORMAL LOW (ref 39.0–52.0)
Hemoglobin: 12.1 g/dL — ABNORMAL LOW (ref 13.0–17.0)
Immature Granulocytes: 0 %
Lymphocytes Relative: 20 %
Lymphs Abs: 2 10*3/uL (ref 0.7–4.0)
MCH: 29.4 pg (ref 26.0–34.0)
MCHC: 32.6 g/dL (ref 30.0–36.0)
MCV: 90.3 fL (ref 80.0–100.0)
Monocytes Absolute: 1 10*3/uL (ref 0.1–1.0)
Monocytes Relative: 10 %
Neutro Abs: 6.8 10*3/uL (ref 1.7–7.7)
Neutrophils Relative %: 67 %
Platelets: 176 10*3/uL (ref 150–400)
RBC: 4.11 MIL/uL — ABNORMAL LOW (ref 4.22–5.81)
RDW: 13.6 % (ref 11.5–15.5)
WBC: 10.1 10*3/uL (ref 4.0–10.5)
nRBC: 0 % (ref 0.0–0.2)

## 2020-01-02 LAB — GLUCOSE, CAPILLARY
Glucose-Capillary: 137 mg/dL — ABNORMAL HIGH (ref 70–99)
Glucose-Capillary: 153 mg/dL — ABNORMAL HIGH (ref 70–99)
Glucose-Capillary: 153 mg/dL — ABNORMAL HIGH (ref 70–99)
Glucose-Capillary: 160 mg/dL — ABNORMAL HIGH (ref 70–99)
Glucose-Capillary: 175 mg/dL — ABNORMAL HIGH (ref 70–99)
Glucose-Capillary: 181 mg/dL — ABNORMAL HIGH (ref 70–99)
Glucose-Capillary: 248 mg/dL — ABNORMAL HIGH (ref 70–99)

## 2020-01-02 LAB — BASIC METABOLIC PANEL
Anion gap: 9 (ref 5–15)
BUN: 19 mg/dL (ref 8–23)
CO2: 22 mmol/L (ref 22–32)
Calcium: 9 mg/dL (ref 8.9–10.3)
Chloride: 107 mmol/L (ref 98–111)
Creatinine, Ser: 1.2 mg/dL (ref 0.61–1.24)
GFR calc Af Amer: >60 mL/min (ref >60)
GFR calc non Af Amer: 59 mL/min — ABNORMAL LOW (ref >60)
Glucose, Bld: 151 mg/dL — ABNORMAL HIGH (ref 70–99)
Potassium: 4.3 mmol/L (ref 3.5–5.1)
Sodium: 138 mmol/L (ref 135–145)

## 2020-01-02 LAB — LIPASE, BLOOD: Lipase: 18 U/L (ref 11–51)

## 2020-01-02 MED ORDER — OXYCODONE HCL 5 MG PO TABS
5.0000 mg | ORAL_TABLET | ORAL | Status: DC | PRN
  Filled 2020-01-02 (×2): qty 1

## 2020-01-02 MED ORDER — ALBUTEROL SULFATE (2.5 MG/3ML) 0.083% IN NEBU: 2.5000 mg | INHALATION_SOLUTION | Freq: Four times a day (QID) | RESPIRATORY_TRACT | Status: DC | PRN

## 2020-01-02 MED ORDER — TRAZODONE HCL 50 MG PO TABS
50.0000 mg | ORAL_TABLET | Freq: Once | ORAL | Status: AC
  Administered 2020-01-02: 50 mg via ORAL
  Filled 2020-01-02: qty 1

## 2020-01-02 NOTE — Progress Notes (Signed)
PROGRESS NOTE  Sean Saunders [id number] DOB: [birth year] DOA: 12/31/2019 PCP: Saunders, Sean G, MD  HPI/Recap of past 24 hours: HPI from Dr Sean Saunders is a 74 y.o. male with medical history significant of DM2, HTN, PAD s/p aorto-bifem bypass, stroke, presents to ED with 3 day h/o abd pain in the epigastric region, some nausea, no vomiting. Worse with eating. In the ED, lipase of 63, acute pancreatitis on CT scan.  Does have cholelithiasis though CBD normal diameter. LFTs also nl.  WBC nl, no SIRS. BUN 35, Creat 1.69, unknown baseline but has CKD stage 3 listed in chart. No EtOH use.  Patient admitted for further management.    Today, patient still reports epigastric pain although improving.  Able to tolerate clear liquid diet, will advance to full.  Denies any further nausea.    Assessment/Plan: Principal Problem:   Acute pancreatitis Active Problems:   Peripheral vascular disease, unspecified ([place])   Type 2 diabetes mellitus with diabetic neuropathy, unspecified ([name])   Hypertension with heart disease   CKD (chronic kidney disease), stage III   Cholelithiasis   Acute pancreatitis Lipase 63-->18 Triglycerides 194 Denies any alcohol use, ??HCTZ use, hold for now CT abdomen/pelvis showed acute pancreatitis RUQ ultrasound showed cholelithiasis without sonographic evidence for acute cholecystitis or biliary dilatation IV fluids Supportive management  CKD stage III No recent baseline Continue to hold lisinopril, hydrochlorothiazide, Lasix for now May consider stopping HCTZ prior to d/c 2/2 CKD Daily BMP  Diabetes mellitus type 2 Last A1c 7.7 SSI, Accu-Cheks, hypoglycemic protocol Hold home regimen  Hypertension Continue home amlodipine, hydralazine Continue to hold lisinopril, Lasix, HCTZ  PAD Continue Plavix, Crestor  Hypothyroidism Continue Synthroid  BPH Continue Flomax         Malnutrition Type:      Malnutrition  Characteristics:      Nutrition Interventions:       Estimated body mass index is 30.24 kg/m as calculated from the following:   Height as of this encounter: 6' (1.829 m).   Weight as of this encounter: 101.2 kg.     Code Status: Full  Family Communication: None at bedside  Disposition Plan: Likely home   Consultants:  None  Procedures:  None  Antimicrobials:  None  DVT prophylaxis: Lovenox   Objective: Vitals:   01/01/20 1727 01/01/20 2059 01/02/20 0609 01/02/20 1245  BP: (!) 149/64 (!) 159/69 138/63 (!) 149/71  Pulse: 85 98 95 95  Resp: 18 18 18 14   Temp: 97.8 F (36.6 C) 98.4 F (36.9 C) 99 F (37.2 C) 98.8 F (37.1 C)  TempSrc: Oral Oral Oral Oral  SpO2:  95% 95% 96%  Weight: 101.2 kg     Height:        Intake/Output Summary (Last 24 hours) at 01/02/2020 1659 Last data filed at 01/02/2020 1500 Gross per 24 hour  Intake 1993.71 ml  Output 1875 ml  Net 118.71 ml   Filed Weights   01/01/20 1722 01/01/20 1727  Weight: 101.2 kg 101.2 kg    Exam:  General: NAD   Cardiovascular: S1, S2 present  Respiratory: CTAB  Abdomen: Soft, +tender, nondistended, bowel sounds present  Musculoskeletal: No bilateral pedal edema noted  Skin: Normal  Psychiatry: Normal mood   Data Reviewed: CBC: Recent Labs  Lab 12/31/19 1718 01/01/20 0424 01/02/20 0536  WBC 10.5 8.0 10.1  NEUTROABS  --   --  6.8  HGB 13.3 12.4* 12.1*  HCT 42.4 38.6* 37.1*  MCV 91.8  90.6 90.3  PLT 216 175 0000000   Basic Metabolic Panel: Recent Labs  Lab 12/31/19 1718 01/01/20 0424 01/02/20 0536  NA 137 137 138  K 4.6 4.4 4.3  CL 102 107 107  CO2 25 22 22   GLUCOSE 193* 187* 151*  BUN 35* 30* 19  CREATININE 1.69* 1.39* 1.20  CALCIUM 10.4* 9.3 9.0   GFR: Estimated Creatinine Clearance: 66.5 mL/min (by C-G formula based on SCr of 1.2 mg/dL). Liver Function Tests: Recent Labs  Lab 12/31/19 1718 01/01/20 0424  AST 18 17  ALT 24 20  ALKPHOS 89 79  BILITOT 0.5  0.4  PROT 7.2 6.0*  ALBUMIN 4.4 3.7   Recent Labs  Lab 12/31/19 1718 01/02/20 0536  LIPASE 63* 18   No results for input(s): AMMONIA in the last 168 hours. Coagulation Profile: No results for input(s): INR, PROTIME in the last 168 hours. Cardiac Enzymes: No results for input(s): CKTOTAL, CKMB, CKMBINDEX, TROPONINI in the last 168 hours. BNP (last 3 results) No results for input(s): PROBNP in the last 8760 hours. HbA1C: Recent Labs    12/31/19 1718  HGBA1C 7.7*   CBG: Recent Labs  Lab 01/02/20 0012 01/02/20 0433 01/02/20 0829 01/02/20 1123 01/02/20 1612  GLUCAP 160* 153* 137* 153* 248*   Lipid Profile: Recent Labs    12/31/19 1718 01/01/20 0424  TRIG 236* 194*   Thyroid Function Tests: No results for input(s): TSH, T4TOTAL, FREET4, T3FREE, THYROIDAB in the last 72 hours. Anemia Panel: No results for input(s): VITAMINB12, FOLATE, FERRITIN, TIBC, IRON, RETICCTPCT in the last 72 hours. Urine analysis:    Component Value Date/Time   COLORURINE YELLOW 12/31/2019 1711   APPEARANCEUR CLEAR 12/31/2019 1711   LABSPEC 1.035 (H) 12/31/2019 1711   PHURINE 5.0 12/31/2019 1711   GLUCOSEU NEGATIVE 12/31/2019 1711   HGBUR NEGATIVE 12/31/2019 1711   BILIRUBINUR NEGATIVE 12/31/2019 1711   KETONESUR NEGATIVE 12/31/2019 1711   PROTEINUR NEGATIVE 12/31/2019 1711   UROBILINOGEN 0.2 07/03/2012 0937   NITRITE NEGATIVE 12/31/2019 1711   LEUKOCYTESUR NEGATIVE 12/31/2019 1711   Sepsis Labs: @LABRCNTIP (procalcitonin:4,lacticidven:4)  ) Recent Results (from the past 240 hour(s))  SARS CORONAVIRUS 2 (TAT 6-24 HRS) Nasopharyngeal Nasopharyngeal Swab     Status: None   Collection Time: 12/31/19  9:39 PM   Specimen: Nasopharyngeal Swab  Result Value Ref Range Status   SARS Coronavirus 2 NEGATIVE NEGATIVE Final    Comment: (NOTE) SARS-CoV-2 target nucleic acids are NOT DETECTED. The SARS-CoV-2 RNA is generally detectable in upper and lower respiratory specimens during the acute  phase of infection. Negative results do not preclude SARS-CoV-2 infection, do not rule out co-infections with other pathogens, and should not be used as the sole basis for treatment or other patient management decisions. Negative results must be combined with clinical observations, patient history, and epidemiological information. The expected result is Negative. Fact Sheet for Patients: SugarRoll.be Fact Sheet for Healthcare Providers: https://www.woods-mathews.com/ This test is not yet approved or cleared by the [place] FDA and  has been authorized for detection and/or diagnosis of SARS-CoV-2 by FDA under an Emergency Use Authorization (EUA). This EUA will remain  in effect (meaning this test can be used) for the duration of the COVID-19 declaration under Section 56 4(b)(1) of the Act, 21 U.S.C. section 360bbb-3(b)(1), unless the authorization is terminated or revoked sooner. Performed at [facility], [place] [address], [place], [place] [address]       Studies: No results found.  Scheduled Meds: . allopurinol  100 mg Oral BID  . amLODipine  10 mg Oral Daily  . clopidogrel  75 mg Oral Daily  . enoxaparin (LOVENOX) injection  40 mg Subcutaneous Q24H  . fluticasone  2 spray Each Nare Daily  . fluticasone furoate-vilanterol  1 puff Inhalation Daily  . hydrALAZINE  25 mg Oral BID  . insulin aspart  0-20 Units Subcutaneous Q4H  . levothyroxine  150 mcg Oral Q0600  . multivitamin with minerals  1 tablet Oral Daily  . rOPINIRole  0.25 mg Oral QHS  . rosuvastatin  20 mg Oral q1800  . tamsulosin  0.4 mg Oral QPC breakfast  . umeclidinium bromide  1 puff Inhalation Daily    Continuous Infusions: . sodium chloride 100 mL/hr at 01/02/20 [id number]     LOS: 1 day     Alma Friendly, MD [facility]  If 7PM-7AM, please contact night-coverage [email or web address] 01/02/2020, 4:59 PM

## 2020-01-03 DIAGNOSIS — I119 Hypertensive heart disease without heart failure: Secondary | ICD-10-CM

## 2020-01-03 LAB — BASIC METABOLIC PANEL
Anion gap: 8 (ref 5–15)
BUN: 15 mg/dL (ref 8–23)
CO2: 22 mmol/L (ref 22–32)
Calcium: 9.1 mg/dL (ref 8.9–10.3)
Chloride: 108 mmol/L (ref 98–111)
Creatinine, Ser: 1.17 mg/dL (ref 0.61–1.24)
GFR calc Af Amer: >60 mL/min (ref >60)
GFR calc non Af Amer: >60 mL/min (ref >60)
Glucose, Bld: 209 mg/dL — ABNORMAL HIGH (ref 70–99)
Potassium: 4 mmol/L (ref 3.5–5.1)
Sodium: 138 mmol/L (ref 135–145)

## 2020-01-03 LAB — CBC WITH DIFFERENTIAL/PLATELET
Abs Immature Granulocytes: 0.02 10*3/uL (ref 0.00–0.07)
Basophils Absolute: 0 10*3/uL (ref 0.0–0.1)
Basophils Relative: 0 %
Eosinophils Absolute: 0.2 10*3/uL (ref 0.0–0.5)
Eosinophils Relative: 2 %
HCT: 34.7 % — ABNORMAL LOW (ref 39.0–52.0)
Hemoglobin: 11.2 g/dL — ABNORMAL LOW (ref 13.0–17.0)
Immature Granulocytes: 0 %
Lymphocytes Relative: 25 %
Lymphs Abs: 2.3 10*3/uL (ref 0.7–4.0)
MCH: 28.9 pg (ref 26.0–34.0)
MCHC: 32.3 g/dL (ref 30.0–36.0)
MCV: 89.7 fL (ref 80.0–100.0)
Monocytes Absolute: 0.9 10*3/uL (ref 0.1–1.0)
Monocytes Relative: 9 %
Neutro Abs: 5.7 10*3/uL (ref 1.7–7.7)
Neutrophils Relative %: 64 %
Platelets: 175 10*3/uL (ref 150–400)
RBC: 3.87 MIL/uL — ABNORMAL LOW (ref 4.22–5.81)
RDW: 13.5 % (ref 11.5–15.5)
WBC: 9.1 10*3/uL (ref 4.0–10.5)
nRBC: 0 % (ref 0.0–0.2)

## 2020-01-03 LAB — GLUCOSE, CAPILLARY
Glucose-Capillary: 143 mg/dL — ABNORMAL HIGH (ref 70–99)
Glucose-Capillary: 163 mg/dL — ABNORMAL HIGH (ref 70–99)
Glucose-Capillary: 201 mg/dL — ABNORMAL HIGH (ref 70–99)

## 2020-01-03 NOTE — Discharge Summary (Signed)
Discharge Summary  Sean Saunders [id number] DOB: December 24, 1945  PCP: Martinique, Betty G, MD  Admit date: 12/31/2019 Discharge date: 01/03/2020  Time spent: 40 mins  Recommendations for Outpatient Follow-up:  1. PCP in 1 week with repeat labs  Discharge Diagnoses:  Active Hospital Problems   Diagnosis Date Noted  . Acute pancreatitis 12/31/2019  . Cholelithiasis 12/31/2019  . CKD (chronic kidney disease), stage III 09/17/2018  . Hypertension with heart disease 09/04/2017  . Type 2 diabetes mellitus with diabetic neuropathy, unspecified ([place]) 03/21/2014  . Peripheral vascular disease, unspecified ([place]) 05/29/2013    Resolved Hospital Problems  No resolved problems to display.    Discharge Condition: Stable  Diet recommendation: Soft diet  Vitals:   01/03/20 0555 01/03/20 0723  BP: (!) 155/71   Pulse: 96   Resp: 16   Temp: 98.8 F (37.1 C)   SpO2: 94% 94%    History of present illness:  Sean Saunders a 74 y.o.malewith medical history significant ofDM2, HTN, PAD s/p aorto-bifem bypass, stroke, presents to ED with 3 day h/o abd pain in the epigastric region, some nausea, no vomiting. Worse with eating. In the ED, lipase of 63, acute pancreatitis on CT scan. Does have cholelithiasis though CBD normal diameter. LFTs also nl. WBC nl, no SIRS. BUN 35, Creat 1.69, unknown baseline but has CKD stage 3 listed in chart. No EtOH use.  Patient admitted for further management.   Today, patient reports feeling much better, denies any new complaints.  Denies any nausea/vomiting, no significant epigastric pain, denies any fever/chills, chest pain, shortness of breath, cough.   Hospital Course:  Principal Problem:   Acute pancreatitis Active Problems:   Peripheral vascular disease, unspecified ([place])   Type 2 diabetes mellitus with diabetic neuropathy, unspecified ([place])   Hypertension with heart disease   CKD (chronic kidney disease), stage III   Cholelithiasis  Acute  pancreatitis Lipase 63-->18 Triglycerides 194 Denies any alcohol use, likely 2/2 possible ??medication induced (HCTZ) CT abdomen/pelvis showed acute pancreatitis RUQ ultrasound showed cholelithiasis without sonographic evidence for acute cholecystitis or biliary dilatation Follow-up with PCP If pancreatitis recurs, may benefit from possible cholecystectomy due to history of cholelithiasis  CKD stage III No recent baseline, creatinine back to normal Follow-up with PCP, recommend discontinuing hydrochlorothiazide in a patient with history of CKD and possible medication induced pancreatitis  Diabetes mellitus type 2 Last A1c 7.7 Continue home regimen  Hypertension Continue home amlodipine, hydralazine, lisinopril, Lasix Discontinued hydrochlorothiazide  PAD Continue Plavix, Crestor  Hypothyroidism Continue Synthroid  BPH Continue Flomax        Malnutrition Type:      Malnutrition Characteristics:      Nutrition Interventions:      Estimated body mass index is 30.24 kg/m as calculated from the following:   Height as of this encounter: 6' (1.829 m).   Weight as of this encounter: 101.2 kg.    Procedures:  None  Consultations:  None  Discharge Exam: BP (!) 155/71 (BP Location: Left Arm)   Pulse 96   Temp 98.8 F (37.1 C) (Oral)   Resp 16   Ht 6' (1.829 m)   Wt 101.2 kg   SpO2 94%   BMI 30.24 kg/m   General: NAD Cardiovascular: S1, S2 present Respiratory: CTA B Abdomen: Soft, nontender, nondistended, bowel sounds present  Discharge Instructions You were cared for by a hospitalist during your hospital stay. If you have any questions about your discharge medications or the care you received while you  were in the hospital after you are discharged, you can call the unit and asked to speak with the hospitalist on call if the hospitalist that took care of you is not available. Once you are discharged, your primary care physician will handle any  further medical issues. Please note that NO REFILLS for any discharge medications will be authorized once you are discharged, as it is imperative that you return to your primary care physician (or establish a relationship with a primary care physician if you do not have one) for your aftercare needs so that they can reassess your need for medications and monitor your lab values.  Discharge Instructions    Diet - low sodium heart healthy   Complete by: As directed    Increase activity slowly   Complete by: As directed      Allergies as of 01/03/2020      Reactions   Eszopiclone Itching   Niaspan [niacin Er] Nausea And Vomiting, Rash      Medication List    STOP taking these medications   hydrochlorothiazide 25 MG tablet Commonly known as: HYDRODIURIL     TAKE these medications   amLODipine 10 MG tablet Commonly known as: NORVASC Take 10 mg by mouth daily.   clopidogrel 75 MG tablet Commonly known as: PLAVIX Take 75 mg by mouth daily.   fluticasone 50 MCG/ACT nasal spray Commonly known as: FLONASE Place 2 sprays into both nostrils daily.   furosemide 20 MG tablet Commonly known as: LASIX Take 20 mg by mouth daily.   HUMULIN R 500 UNIT/ML injection Generic drug: insulin regular human CONCENTRATED Inject 40-80 Units into the skin See admin instructions. 70units in am 40units at lunch 80units at dinner   hydrALAZINE 25 MG tablet Commonly known as: APRESOLINE Take 25 mg by mouth 2 (two) times daily.   levothyroxine 150 MCG tablet Commonly known as: SYNTHROID Take 150 mcg by mouth daily before breakfast.   lisinopril 40 MG tablet Commonly known as: ZESTRIL Take 40 mg by mouth daily.   metFORMIN 500 MG tablet Commonly known as: GLUCOPHAGE Take 500 mg by mouth 2 (two) times daily with a meal.   multivitamin with minerals Tabs tablet Take 1 tablet by mouth daily.   rOPINIRole 0.25 MG tablet Commonly known as: REQUIP Take 0.25 mg by mouth at bedtime.    rosuvastatin 20 MG tablet Commonly known as: CRESTOR Take 20 mg by mouth daily.   tamsulosin 0.4 MG Caps capsule Commonly known as: FLOMAX Take 0.4 mg by mouth.   Trelegy Ellipta 100-62.5-25 MCG/INH Aepb Generic drug: Fluticasone-Umeclidin-Vilant INHALE 1 PUFF INTO THE LUNGS DAILY What changed: See the new instructions.   Zyloprim 100 MG tablet Generic drug: allopurinol Take 100 mg by mouth 2 (two) times daily.      Allergies  Allergen Reactions  . Eszopiclone Itching  . Niaspan [Niacin Er] Nausea And Vomiting and Rash   Follow-up Information    Martinique, Betty G, MD. Schedule an appointment as soon as possible for a visit in 1 week(s).   Specialty: Family Medicine Contact information: [place] [address] [phone]            The results of significant diagnostics from this hospitalization (including imaging, microbiology, ancillary and laboratory) are listed below for reference.    Significant Diagnostic Studies: DG Chest 2 View  Result Date: 12/31/2019 CLINICAL DATA:  Chest pain. EXAM: CHEST - 2 VIEW COMPARISON:  September 03, 2019 FINDINGS: The heart size and  mediastinal contours are within normal limits. Both lungs are clear. The visualized skeletal structures are unremarkable. IMPRESSION: No active cardiopulmonary disease. Electronically Signed   By: Virgina Norfolk M.D.   On: 12/31/2019 17:32   CT Abdomen Pelvis W Contrast  Result Date: 12/31/2019 CLINICAL DATA:  74 year old male with abdominal pain and distention. EXAM: CT ABDOMEN AND PELVIS WITH CONTRAST TECHNIQUE: Multidetector CT imaging of the abdomen and pelvis was performed using the standard protocol following bolus administration of intravenous contrast. CONTRAST:  78mL OMNIPAQUE IOHEXOL 300 MG/ML  SOLN COMPARISON:  None. FINDINGS: Evaluation of this exam is limited due to respiratory motion artifact. Lower chest: The visualized lung bases are clear. Coronary vascular  calcification of the RCA noted. No intra-abdominal free air or free fluid. Hepatobiliary: Apparent mild fatty infiltration of the liver. No intrahepatic biliary ductal dilatation. There multiple stones within the gallbladder. No pericholecystic fluid or evidence of acute cholecystitis by CT. Pancreas: There is mild haziness and inflammatory changes of the head and uncinate process of the pancreas most consistent with acute pancreatitis. Correlation with pancreatic enzymes recommended. No drainable fluid collection/abscess or pseudocyst. No dilatation of the main pancreatic duct. Spleen: Normal in size without focal abnormality. Adrenals/Urinary Tract: The adrenal glands are unremarkable. There is no hydronephrosis on either side. There is symmetric enhancement and excretion of contrast by both kidneys. Bilateral subcentimeter hypodense lesions are not characterized on this CT. There is a 15 mm indeterminate low attenuating lesion in the interpolar aspect of the right kidney. Further evaluation with ultrasound on a nonemergent/outpatient basis recommended. The visualized ureters and urinary bladder appear unremarkable Stomach/Bowel: There is sigmoid diverticulosis and scattered colonic diverticula without active inflammatory changes. Several normal caliber fecalized loops of small bowel in the mid abdomen may represent increased transit time or small intestinal bacterial overgrowth. Clinical correlation is recommended. There is no bowel obstruction. Appendectomy. Vascular/Lymphatic: Advanced aortoiliac atherosclerotic disease. There is occlusion of the distal abdominal aorta status post prior aortobifemoral bypass graft. The graft appears patent as visualized. The IVC is unremarkable. No portal venous gas. There is no adenopathy. Reproductive: Enlarged prostate gland measuring approximately 7 cm in transverse axial dimension. Other: Induration of the subcutaneous soft tissues of the anterior abdominal wall, likely  related to subcutaneous injections. No fluid collection. Musculoskeletal: Total right hip arthroplasty. No acute osseous pathology. IMPRESSION: 1. Acute pancreatitis.  No abscess or pseudocyst. 2. Cholelithiasis. 3. Diverticulosis.  No bowel obstruction or active inflammation. 4.  Aortic Atherosclerosis (ICD10-I70.0). 5. Occlusion of the distal abdominal aorta status post prior aortobifemoral bypass graft. 6. Enlarged prostate gland. Electronically Signed   By: Anner Crete M.D.   On: 12/31/2019 21:22   US Abdomen Limited RUQ  Result Date: 12/31/2019 CLINICAL DATA:  Pancreatitis EXAM: ULTRASOUND ABDOMEN LIMITED RIGHT UPPER QUADRANT COMPARISON:  None. CT 12/31/2019 FINDINGS: Gallbladder: Multiple stones measuring up to 1.1 cm. Normal wall thickness. Negative sonographic Murphy Common bile duct: Diameter: 1.8 mm Liver: Increased hepatic echogenicity. No focal hepatic abnormality. Portal vein is patent on color Doppler imaging with normal direction of blood flow towards the liver. Other: None. IMPRESSION: 1. Cholelithiasis without sonographic evidence for acute cholecystitis or biliary dilatation 2. Echogenic liver consistent with steatosis and or hepatocellular disease Electronically Signed   By: Donavan Foil M.D.   On: 12/31/2019 22:17    Microbiology: Recent Results (from the past 240 hour(s))  SARS CORONAVIRUS 2 (TAT 6-24 HRS) Nasopharyngeal Nasopharyngeal Swab     Status: None   Collection Time: 12/31/19  9:39  PM   Specimen: Nasopharyngeal Swab  Result Value Ref Range Status   SARS Coronavirus 2 NEGATIVE NEGATIVE Final    Comment: (NOTE) SARS-CoV-2 target nucleic acids are NOT DETECTED. The SARS-CoV-2 RNA is generally detectable in upper and lower respiratory specimens during the acute phase of infection. Negative results do not preclude SARS-CoV-2 infection, do not rule out co-infections with other pathogens, and should not be used as the sole basis for treatment or other patient  management decisions. Negative results must be combined with clinical observations, patient history, and epidemiological information. The expected result is Negative. Fact Sheet for Patients: SugarRoll.be Fact Sheet for Healthcare Providers: https://www.woods-mathews.com/ This test is not yet approved or cleared by the [place] FDA and  has been authorized for detection and/or diagnosis of SARS-CoV-2 by FDA under an Emergency Use Authorization (EUA). This EUA will remain  in effect (meaning this test can be used) for the duration of the COVID-19 declaration under Section 56 4(b)(1) of the Act, 21 U.S.C. section 360bbb-3(b)(1), unless the authorization is terminated or revoked sooner. Performed at [facility], [place] [address] [place], [place] [address]      Labs: Basic Metabolic Panel: Recent Labs  Lab 12/31/19 1718 01/01/20 0424 01/02/20 0536 01/03/20 0551  NA 137 137 138 138  K 4.6 4.4 4.3 4.0  CL 102 107 107 108  CO2 25 22 22 22   GLUCOSE 193* 187* 151* 209*  BUN 35* 30* 19 15  CREATININE 1.69* 1.39* 1.20 1.17  CALCIUM 10.4* 9.3 9.0 9.1   Liver Function Tests: Recent Labs  Lab 12/31/19 1718 01/01/20 0424  AST 18 17  ALT 24 20  ALKPHOS 89 79  BILITOT 0.5 0.4  PROT 7.2 6.0*  ALBUMIN 4.4 3.7   Recent Labs  Lab 12/31/19 1718 01/02/20 0536  LIPASE 63* 18   No results for input(s): AMMONIA in the last 168 hours. CBC: Recent Labs  Lab 12/31/19 1718 01/01/20 0424 01/02/20 0536 01/03/20 0551  WBC 10.5 8.0 10.1 9.1  NEUTROABS  --   --  6.8 5.7  HGB 13.3 12.4* 12.1* 11.2*  HCT 42.4 38.6* 37.1* 34.7*  MCV 91.8 90.6 90.3 89.7  PLT 216 175 176 175   Cardiac Enzymes: No results for input(s): CKTOTAL, CKMB, CKMBINDEX, TROPONINI in the last 168 hours. BNP: BNP (last 3 results) No results for input(s): BNP in the last 8760 hours.  ProBNP (last 3 results) No results for input(s): PROBNP in the last 8760  hours.  CBG: Recent Labs  Lab 01/02/20 1612 01/02/20 2025 01/03/20 0029 01/03/20 0410 01/03/20 0755  GLUCAP 248* 181* 163* 143* 201*       Signed:  Alma Friendly, MD [facility] 01/03/2020, 10:32 AM

## 2020-01-03 NOTE — Progress Notes (Signed)
Reviewed discharge paperwork with patient and follow up appointments. Patient declined wheelchair escort and walked to main entrance accompanied by nurse. Patient released in the care of his daughter.

## 2020-01-06 ENCOUNTER — Telehealth: Payer: Self-pay

## 2020-01-06 NOTE — Telephone Encounter (Signed)
Transition Care Management Follow-up Telephone Call  Date of discharge and from where: 01/03/2020 from [facility]  How have you been since you were released from the hospital? "My pain is about a 4 now, I'm a lot better than I was".  Any questions or concerns? No   Items Reviewed:  Did the pt receive and understand the discharge instructions provided? Yes   Medications obtained and verified? Yes   Any new allergies since your discharge? No   Dietary orders reviewed? Yes  Do you have support at home? Yes   Other (ie: DME, Home Health, etc) N/A  Functional Questionnaire: (I = Independent and D = Dependent) ADL's: I can do most things for myself  Bathing/Dressing-    Meal Prep-   Eating-   Maintaining continence-   Transferring/Ambulation-   Managing Meds-    Follow up appointments reviewed:    PCP Hospital f/u appt confirmed? Yes  Scheduled to see Dr. Martinique in office 01/10/20 11:30am  [facility] f/u appt confirmed? N/A  Are transportation arrangements needed? No   If their condition worsens, is the pt aware to call  their PCP or go to the ED? Yes  Was the patient provided with contact information for the PCP's office or ED? Yes  Was the pt encouraged to call back with questions or concerns? Yes

## 2020-01-09 ENCOUNTER — Other Ambulatory Visit: Payer: Self-pay

## 2020-01-10 ENCOUNTER — Ambulatory Visit (INDEPENDENT_AMBULATORY_CARE_PROVIDER_SITE_OTHER): Payer: Medicare HMO | Admitting: Family Medicine

## 2020-01-10 ENCOUNTER — Encounter: Payer: Self-pay | Admitting: Family Medicine

## 2020-01-10 VITALS — BP 136/78 | HR 100 | Temp 96.4°F | Resp 16 | Ht 72.0 in | Wt 229.0 lb

## 2020-01-10 DIAGNOSIS — I743 Embolism and thrombosis of arteries of the lower extremities: Secondary | ICD-10-CM | POA: Diagnosis not present

## 2020-01-10 DIAGNOSIS — Z794 Long term (current) use of insulin: Secondary | ICD-10-CM

## 2020-01-10 DIAGNOSIS — E1140 Type 2 diabetes mellitus with diabetic neuropathy, unspecified: Secondary | ICD-10-CM | POA: Diagnosis not present

## 2020-01-10 DIAGNOSIS — D333 Benign neoplasm of cranial nerves: Secondary | ICD-10-CM

## 2020-01-10 DIAGNOSIS — I119 Hypertensive heart disease without heart failure: Secondary | ICD-10-CM

## 2020-01-10 DIAGNOSIS — K8020 Calculus of gallbladder without cholecystitis without obstruction: Secondary | ICD-10-CM

## 2020-01-10 DIAGNOSIS — J449 Chronic obstructive pulmonary disease, unspecified: Secondary | ICD-10-CM

## 2020-01-10 DIAGNOSIS — K859 Acute pancreatitis without necrosis or infection, unspecified: Secondary | ICD-10-CM | POA: Diagnosis not present

## 2020-01-10 LAB — CBC WITH DIFFERENTIAL/PLATELET
Basophils Absolute: 0 10*3/uL (ref 0.0–0.1)
Basophils Relative: 0.5 % (ref 0.0–3.0)
Eosinophils Absolute: 0.3 10*3/uL (ref 0.0–0.7)
Eosinophils Relative: 3.2 % (ref 0.0–5.0)
HCT: 37.2 % — ABNORMAL LOW (ref 39.0–52.0)
Hemoglobin: 12.3 g/dL — ABNORMAL LOW (ref 13.0–17.0)
Lymphocytes Relative: 32.5 % (ref 12.0–46.0)
Lymphs Abs: 3.4 10*3/uL (ref 0.7–4.0)
MCHC: 33.2 g/dL (ref 30.0–36.0)
MCV: 88.2 fl (ref 78.0–100.0)
Monocytes Absolute: 0.9 10*3/uL (ref 0.1–1.0)
Monocytes Relative: 8.4 % (ref 3.0–12.0)
Neutro Abs: 5.9 10*3/uL (ref 1.4–7.7)
Neutrophils Relative %: 55.4 % (ref 43.0–77.0)
Platelets: 246 10*3/uL (ref 150.0–400.0)
RBC: 4.21 Mil/uL — ABNORMAL LOW (ref 4.22–5.81)
RDW: 14.7 % (ref 11.5–15.5)
WBC: 10.6 10*3/uL — ABNORMAL HIGH (ref 4.0–10.5)

## 2020-01-10 LAB — BASIC METABOLIC PANEL
BUN: 18 mg/dL (ref 6–23)
CO2: 28 mEq/L (ref 19–32)
Calcium: 9.8 mg/dL (ref 8.4–10.5)
Chloride: 106 mEq/L (ref 96–112)
Creatinine, Ser: 1.23 mg/dL (ref 0.40–1.50)
GFR: 57.43 mL/min — ABNORMAL LOW (ref >60.00)
Glucose, Bld: 56 mg/dL — ABNORMAL LOW (ref 70–99)
Potassium: 4.4 mEq/L (ref 3.5–5.1)
Sodium: 142 mEq/L (ref 135–145)

## 2020-01-10 LAB — LIPASE: Lipase: 16 U/L (ref 11.0–59.0)

## 2020-01-10 MED ORDER — HYDRALAZINE HCL 25 MG PO TABS: 25.0000 mg | ORAL_TABLET | Freq: Two times a day (BID) | ORAL | 1 refills | Status: DC

## 2020-01-10 NOTE — Assessment & Plan Note (Signed)
Multiple gallbladder stones, which could have been contributed to acute pancreatitis and risk of recurrence, so surgeon consultation will be arranged. Clearly instructed about warning signs.

## 2020-01-10 NOTE — Progress Notes (Signed)
HPI:   [name] is a 74 y.o. male, who is here today to follow on recent hospitalization.  He was last seen on 08/07/2019. He presented to the ER c/o right sided CP and upper abdominal pain. Admitted on 12/31/19 and discharged on 01/03/20. TCM call on 01/06/20.  Discharged Dx: Acute pancreatitis. Lipase upon admission was 63. TG 194.  Abdominal CT 12/31/19.: 1. Acute pancreatitis.  No abscess or pseudocyst. 2. Cholelithiasis. 3. Diverticulosis.  No bowel obstruction or active inflammation. 4.  Aortic Atherosclerosis (ICD10-I70.0). 5. Occlusion of the distal abdominal aorta status post prior aortobifemoral bypass graft. 6. Enlarged prostate gland.  Found multiple gall bladder stones.  Lab Results  Component Value Date   LIPASE 18 01/02/2020   Still having mild epigastric discomfort radiated to right-sided chest. Pain is exacerbated by food intake. He is on liquid diet and advancing as tolerated.  Negative for fever,chills,SOB,palpitations,N/V,or urinary symptoms.   HTN: He is not checking BP regularly.  Lab Results  Component Value Date   CREATININE 1.17 01/03/2020   BUN 15 01/03/2020   NA 138 01/03/2020   K 4.0 01/03/2020   CL 108 01/03/2020   CO2 22 01/03/2020   Because possible cause of acute pancreatitis, it was recommended to stop HCTZ 25 mg but he continues because it has helped to better control HTN.  He is also on hydralazine 25 mg twice daily and Amlodipine 10 mg daily. He is not checking BP at home.  Stopped Humulin R after hospital discharge because it was causing hypoglycemia, 40's. BS's 200's. He is on Metformin 500 mg bid.  Lab Results  Component Value Date   HGBA1C 7.7 (H) 12/31/2019    PAD: He tells me that he quit smoking. Negative for leg pain, stable LE edema. S/P aortobifemoral bypass graft in 2006 and left femoolpopliteal bypass 2012.  Currently he is on Plavix 75 mg and Crestor 20 mg daily. He is following with Dr.  Glory Saunders.  COPD: He has not noted unusual cough,wheezing, or exertional dyspnea.  Vestibular schwannoma: States that he is following with neurosurgeon through the [place].  He has not noted headache or MS changes.  Sinus CT 08/2019. Chronic calcified right para falcine meningioma measuring about 22 millimeters appears stable since the 2019 MR.  Grossly negative other visible brain parenchyma.   Review of Systems  Constitutional: Negative for activity change, appetite change and fatigue.  HENT: Negative for nosebleeds, sore throat and trouble swallowing.   Eyes: Negative for redness and visual disturbance.  Respiratory: Negative for cough and wheezing.   Gastrointestinal: Negative for abdominal distention.       No changes in bowel habits.  Genitourinary: Negative for decreased urine volume, dysuria and hematuria.  Musculoskeletal: Positive for arthralgias. Negative for gait problem and myalgias.  Skin: Negative for pallor and rash.  Neurological: Negative for syncope, weakness and headaches.  Psychiatric/Behavioral: Negative for confusion. The patient is not nervous/anxious.   Rest see pertinent positives and negatives per HPI.   Current Outpatient Medications on File Prior to Visit  Medication Sig Dispense Refill  . allopurinol (ZYLOPRIM) 100 MG tablet Take 100 mg by mouth 2 (two) times daily.    Marland Kitchen amLODipine (NORVASC) 10 MG tablet Take 10 mg by mouth daily.    . clopidogrel (PLAVIX) 75 MG tablet Take 75 mg by mouth daily.      . fluticasone (FLONASE) 50 MCG/ACT nasal spray Place 2 sprays into both nostrils daily. 16 g 5  .  furosemide (LASIX) 20 MG tablet Take 20 mg by mouth daily.     . insulin regular human CONCENTRATED (HUMULIN R) 500 UNIT/ML SOLN injection Inject 40-80 Units into the skin See admin instructions. 70units in am 40units at lunch 80units at dinner    . levothyroxine (SYNTHROID, LEVOTHROID) 150 MCG tablet Take 150 mcg by mouth daily before breakfast.    . lisinopril  (ZESTRIL) 40 MG tablet Take 40 mg by mouth daily.    . metFORMIN (GLUCOPHAGE) 500 MG tablet Take 500 mg by mouth 2 (two) times daily with a meal.    . Multiple Vitamin (MULTIVITAMIN WITH MINERALS) TABS tablet Take 1 tablet by mouth daily.    Marland Kitchen rOPINIRole (REQUIP) 0.25 MG tablet Take 0.25 mg by mouth at bedtime.    . rosuvastatin (CRESTOR) 20 MG tablet Take 20 mg by mouth daily.    . tamsulosin (FLOMAX) 0.4 MG CAPS capsule Take 0.4 mg by mouth.    . TRELEGY ELLIPTA 100-62.5-25 MCG/INH AEPB INHALE 1 PUFF INTO THE LUNGS DAILY (Patient taking differently: Inhale 1 puff into the lungs daily. ) 60 each 1   No current facility-administered medications on file prior to visit.     Past Medical History:  Diagnosis Date  . Arthritis   . Bronchitis    hx of  . COPD (chronic obstructive pulmonary disease) ([place])   . Diabetes mellitus   . DVT (deep venous thrombosis) ([place])   . GERD (gastroesophageal reflux disease)   . Hyperlipidemia   . Hypertension   . Hypothyroidism   . Onychomycosis 06/07/2013  . Stroke [facility])    TIA's 01/05/11-01/08/11-01/26/11  . Thyroid disease   . TIA (transient ischemic attack) 08/03/2011   Left sided weakness. 3 episodes Feb-March  . TIA (transient ischemic attack)    x 3   Allergies  Allergen Reactions  . Eszopiclone Itching  . Niaspan [Niacin Er] Nausea And Vomiting and Rash    Social History   Socioeconomic History  . Marital status: Married    Spouse name: Not on file  . Number of children: Not on file  . Years of education: Not on file  . Highest education level: Not on file  Occupational History  . Not on file  Tobacco Use  . Smoking status: Former Smoker    Packs/day: 1.00    Years: 25.00    Pack years: 25.00    Types: Cigarettes    Quit date: 06/05/2018    Years since quitting: 1.6  . Smokeless tobacco: Never Used  Substance and Sexual Activity  . Alcohol use: Yes    Alcohol/week: 0.0 standard drinks    Comment: rare  . Drug use: No  .  Sexual activity: Not on file  Other Topics Concern  . Not on file  Social History Narrative  . Not on file   Social Determinants of Health   Financial Resource Strain:   . Difficulty of Paying Living Expenses: Not on file  Food Insecurity:   . Worried About Charity fundraiser in the Last Year: Not on file  . Ran Out of Food in the Last Year: Not on file  Transportation Needs:   . Lack of Transportation (Medical): Not on file  . Lack of Transportation (Non-Medical): Not on file  Physical Activity:   . Days of Exercise per Week: Not on file  . Minutes of Exercise per Session: Not on file  Stress:   . Feeling of Stress : Not on file  Social  Connections:   . Frequency of Communication with Friends and Family: Not on file  . Frequency of Social Gatherings with Friends and Family: Not on file  . Attends Religious Services: Not on file  . Active Member of Clubs or Organizations: Not on file  . Attends Archivist Meetings: Not on file  . Marital Status: Not on file    Vitals:   01/10/20 1122  BP: 136/78  Pulse: 100  Resp: 16  Temp: (!) 96.4 F (35.8 C)  SpO2: 98%   Body mass index is 31.06 kg/m.   Physical Exam  Nursing note and vitals reviewed. Constitutional: He is oriented to person, place, and time. He appears well-developed. No distress.  HENT:  Head: Normocephalic and atraumatic.  Mouth/Throat: Oropharynx is clear and moist and mucous membranes are normal.  Eyes: Pupils are equal, round, and reactive to light. Conjunctivae are normal.  Cardiovascular: Normal rate and regular rhythm.  No murmur heard. Pulses:      Posterior tibial pulses are 2+ on the right side and 2+ on the left side.  Respiratory: Effort normal and breath sounds normal. No respiratory distress.  GI: Soft. He exhibits no mass. There is no hepatomegaly. There is abdominal tenderness in the epigastric area. There is no rigidity, no rebound and no guarding.  Musculoskeletal:         General: Edema (1+ pitting LE edema,bilateral.) present.  Lymphadenopathy:    He has no cervical adenopathy.  Neurological: He is alert and oriented to person, place, and time. He has normal strength. No cranial nerve deficit. Gait normal.  Skin: Skin is warm. No rash noted. No erythema.  Psychiatric: He has a normal mood and affect.  Well groomed, good eye contact.    ASSESSMENT AND PLAN:  Mr. Brazil was seen today for hospitalization follow-up.  Orders Placed This Encounter  Procedures  . Basic metabolic panel  . CBC with Differential/Platelet  . Lipase  . Ambulatory referral to General Surgery   Lab Results  Component Value Date   CREATININE 1.23 01/10/2020   BUN 18 01/10/2020   NA 142 01/10/2020   K 4.4 01/10/2020   CL 106 01/10/2020   CO2 28 01/10/2020   Lab Results  Component Value Date   WBC 10.6 (H) 01/10/2020   HGB 12.3 (L) 01/10/2020   HCT 37.2 (L) 01/10/2020   MCV 88.2 01/10/2020   PLT 246.0 01/10/2020   Lab Results  Component Value Date   LIPASE 16.0 01/10/2020    Hypertension with heart disease BP otherwise adequately controlled. Because there is a possibility that HCTZ could have contributed to acute pancreatitis, recommend discontinue it. Hydralazine 25 mg increased from twice daily to 3 times daily. Recommend monitoring BP regularly. Follow-up in 3 months.  Type 2 diabetes mellitus with diabetic neuropathy, unspecified ([place]) Continue holding on insulin for now. Continue Metformin 500 mg twice daily. He is going to keep appointment with his endocrinologist early next month.   Acute pancreatitis We discussed possible etiologies. Is still having some epigastric and right-sided chest discomfort but in general symptoms have improved greatly. He was clearly instructed about warning signs. Further recommendation will be given according to lab results.   Cholelithiasis Multiple gallbladder stones, which could have been contributed to acute  pancreatitis and risk of recurrence, so surgeon consultation will be arranged. Clearly instructed about warning signs.  COPD (chronic obstructive pulmonary disease) ([place]) Congratulated about smoking cessation. Problem seems to be stable. No changes in current management. Follows  with pulmonologist.  Embolism and thrombosis of arteries of lower extremity ([place]) Problem is is stable. Continue Plavix 75 mg daily and Crestor 20 mg daily. Former smoker. He has an appointment with vascular surgeon late 01/2020.   Vestibular schwannoma [facility]) Following with neurosurgeon. He has an appointment early 02/2020.  Return in about 3 months (around 04/09/2020) for HTN.   Mare Ludtke G. Martinique, MD  [facility]. [place] office.

## 2020-01-10 NOTE — Assessment & Plan Note (Signed)
BP otherwise adequately controlled. Because there is a possibility that HCTZ could have contributed to acute pancreatitis, recommend discontinue it. Hydralazine 25 mg increased from twice daily to 3 times daily. Recommend monitoring BP regularly. Follow-up in 3 months.

## 2020-01-10 NOTE — Patient Instructions (Addendum)
A few things to remember from today's visit:   Hypertension with heart disease - Plan: Basic metabolic panel, hydrALAZINE (APRESOLINE) 25 MG tablet  Acute pancreatitis without infection or necrosis, unspecified pancreatitis type - Plan: Basic metabolic panel, CBC with Differential/Platelet, Lipase  Calculus of gallbladder without cholecystitis without obstruction - Plan: Ambulatory referral to General Surgery  Stop Hydrochlorothiazide. Increase Hydralazine 25 mg from 2 tab daily to 3 tabs daily, every 8 hours.   Please be sure medication list is accurate. If a new problem present, please set up appointment sooner than planned today.

## 2020-01-10 NOTE — Assessment & Plan Note (Signed)
Problem is is stable. Continue Plavix 75 mg daily and Crestor 20 mg daily. Former smoker. He has an appointment with vascular surgeon late 01/2020.

## 2020-01-10 NOTE — Assessment & Plan Note (Signed)
We discussed possible etiologies. Is still having some epigastric and right-sided chest discomfort but in general symptoms have improved greatly. He was clearly instructed about warning signs. Further recommendation will be given according to lab results.

## 2020-01-10 NOTE — Assessment & Plan Note (Addendum)
Following with neurosurgeon. He has an appointment early 02/2020.

## 2020-01-10 NOTE — Assessment & Plan Note (Addendum)
Congratulated about smoking cessation. Problem seems to be stable. No changes in current management. Follows with pulmonologist.

## 2020-01-10 NOTE — Assessment & Plan Note (Signed)
Continue holding on insulin for now. Continue Metformin 500 mg twice daily. He is going to keep appointment with his endocrinologist early next month.

## 2020-02-05 ENCOUNTER — Ambulatory Visit (INDEPENDENT_AMBULATORY_CARE_PROVIDER_SITE_OTHER): Payer: Medicare HMO | Admitting: Vascular Surgery

## 2020-02-05 ENCOUNTER — Other Ambulatory Visit: Payer: Self-pay

## 2020-02-05 ENCOUNTER — Ambulatory Visit (INDEPENDENT_AMBULATORY_CARE_PROVIDER_SITE_OTHER)
Admission: RE | Admit: 2020-02-05 | Discharge: 2020-02-05 | Disposition: A | Discharge status: Home / Self Care | Payer: Medicare HMO | Source: Ambulatory Visit | Attending: Vascular Surgery | Admitting: Vascular Surgery

## 2020-02-05 ENCOUNTER — Encounter: Payer: Self-pay | Admitting: Vascular Surgery

## 2020-02-05 ENCOUNTER — Ambulatory Visit (HOSPITAL_COMMUNITY)
Admission: RE | Admit: 2020-02-05 | Discharge: 2020-02-05 | Disposition: A | Discharge status: Home / Self Care | Payer: Medicare HMO | Source: Ambulatory Visit | Attending: Surgery | Admitting: Surgery

## 2020-02-05 VITALS — BP 137/74 | HR 90 | Temp 97.8°F | Resp 20 | Ht 72.0 in | Wt 221.0 lb

## 2020-02-05 DIAGNOSIS — I739 Peripheral vascular disease, unspecified: Secondary | ICD-10-CM | POA: Insufficient documentation

## 2020-02-05 DIAGNOSIS — R0989 Other specified symptoms and signs involving the circulatory and respiratory systems: Secondary | ICD-10-CM | POA: Diagnosis not present

## 2020-02-05 NOTE — Progress Notes (Signed)
Patient name: Sean Saunders MRN: [id number] DOB: [birth year] Sex: male  REASON FOR VISIT:   Follow-up of peripheral vascular disease.  HPI:   Sean Saunders is a pleasant 74 y.o. male who I last saw on 02/06/2019.  He underwent an aortobifemoral bypass graft in 2006.  In 2012 he had a left femoral to below-knee popliteal artery bypass.  When I saw him last his grafts were working well.  He had a carotid duplex scan which showed no significant carotid disease on either side.  He comes in for routine follow-up visit.  Since I saw him last, unfortunately he lost his third wife to cancer.  Because of the stress of this he did start smoking again again but quit the first of the year.  He denies any history of stroke, TIAs, expressive or receptive aphasia, or amaurosis fugax.  He denies any history of claudication, rest pain, or nonhealing ulcers.  He did have a bout of pancreatitis last year.  Since that time he is requiring less insulin for some reason.  Past Medical History:  Diagnosis Date  . Arthritis   . Bronchitis    hx of  . COPD (chronic obstructive pulmonary disease) ([place])   . Diabetes mellitus   . DVT (deep venous thrombosis) ([place])   . GERD (gastroesophageal reflux disease)   . Hyperlipidemia   . Hypertension   . Hypothyroidism   . Onychomycosis 06/07/2013  . Stroke [facility])    TIA's 01/05/11-01/08/11-01/26/11  . Thyroid disease   . TIA (transient ischemic attack) 08/03/2011   Left sided weakness. 3 episodes Feb-March  . TIA (transient ischemic attack)    x 3    Family History  Problem Relation Age of Onset  . Stroke Mother   . Diabetes Mother   . Other Father        alzheimers  . Diabetes Father   . Stroke Father   . Diabetes Sister     SOCIAL HISTORY: Social History   Tobacco Use  . Smoking status: Former Smoker    Packs/day: 1.00    Years: 25.00    Pack years: 25.00    Types: Cigarettes    Quit date: 06/05/2018    Years since quitting: 1.6  . Smokeless  tobacco: Never Used  Substance Use Topics  . Alcohol use: Yes    Alcohol/week: 0.0 standard drinks    Comment: rare    Allergies  Allergen Reactions  . Eszopiclone Itching  . Niaspan [Niacin Er] Nausea And Vomiting and Rash    Current Outpatient Medications  Medication Sig Dispense Refill  . allopurinol (ZYLOPRIM) 100 MG tablet Take 100 mg by mouth 2 (two) times daily.    Marland Kitchen amLODipine (NORVASC) 10 MG tablet Take 10 mg by mouth daily.    . clopidogrel (PLAVIX) 75 MG tablet Take 75 mg by mouth daily.      . fluticasone (FLONASE) 50 MCG/ACT nasal spray Place 2 sprays into both nostrils daily. 16 g 5  . furosemide (LASIX) 20 MG tablet Take 20 mg by mouth daily.     . hydrALAZINE (APRESOLINE) 25 MG tablet Take 1 tablet (25 mg total) by mouth 2 (two) times daily. 90 tablet 1  . insulin regular human CONCENTRATED (HUMULIN R) 500 UNIT/ML SOLN injection Inject 40-80 Units into the skin See admin instructions. 70units in am 40units at lunch 80units at dinner    . levothyroxine (SYNTHROID, LEVOTHROID) 150 MCG tablet Take 150 mcg by mouth daily before breakfast.    .  lisinopril (ZESTRIL) 40 MG tablet Take 40 mg by mouth daily.    . metFORMIN (GLUCOPHAGE) 500 MG tablet Take 500 mg by mouth 2 (two) times daily with a meal.    . Multiple Vitamin (MULTIVITAMIN WITH MINERALS) TABS tablet Take 1 tablet by mouth daily.    Marland Kitchen rOPINIRole (REQUIP) 0.25 MG tablet Take 0.25 mg by mouth at bedtime.    . rosuvastatin (CRESTOR) 20 MG tablet Take 20 mg by mouth daily.    . tamsulosin (FLOMAX) 0.4 MG CAPS capsule Take 0.4 mg by mouth.    . TRELEGY ELLIPTA 100-62.5-25 MCG/INH AEPB INHALE 1 PUFF INTO THE LUNGS DAILY (Patient taking differently: Inhale 1 puff into the lungs daily. ) 60 each 1   No current facility-administered medications for this visit.    REVIEW OF SYSTEMS:  [X]  denotes positive finding, [ ]  denotes negative finding Cardiac  Comments:  Chest pain or chest pressure:    Shortness of breath  upon exertion: x   Short of breath when lying flat:    Irregular heart rhythm:        Vascular    Pain in calf, thigh, or hip brought on by ambulation:    Pain in feet at night that wakes you up from your sleep:     Blood clot in your veins:    Leg swelling:         Pulmonary    Oxygen at home:    Productive cough:     Wheezing:         Neurologic    Sudden weakness in arms or legs:     Sudden numbness in arms or legs:     Sudden onset of difficulty speaking or slurred speech:    Temporary loss of vision in one eye:     Problems with dizziness:         Gastrointestinal    Blood in stool:     Vomited blood:         Genitourinary    Burning when urinating:     Blood in urine:        Psychiatric    Major depression:         Hematologic    Bleeding problems:    Problems with blood clotting too easily:        Skin    Rashes or ulcers:        Constitutional    Fever or chills:     PHYSICAL EXAM:   Vitals:   02/05/20 1451  BP: 137/74  Pulse: 90  Resp: 20  Temp: 97.8 F (36.6 C)  SpO2: 98%  Weight: 221 lb (100.2 kg)  Height: 6' (1.829 m)    GENERAL: The patient is a well-nourished male, in no acute distress. The vital signs are documented above. CARDIAC: There is a regular rate and rhythm.  VASCULAR: He has bilateral carotid bruits. He has palpable femoral pulses and palpable posterior tibial pulses bilaterally. PULMONARY: There is good air exchange bilaterally without wheezing or rales. ABDOMEN: Soft and non-tender with normal pitched bowel sounds.  He has a small ventral hernia. MUSCULOSKELETAL: There are no major deformities or cyanosis. NEUROLOGIC: No focal weakness or paresthesias are detected. SKIN: There are no ulcers or rashes noted. PSYCHIATRIC: The patient has a normal affect.  DATA:    LEFT LOWER EXTREMITY BYPASS DUPLEX: I have independently interpreted his graft duplex on the left lower extremity.  His left femoral to below-knee popliteal  artery bypass  with extension to the tibial peroneal trunk is patent with triphasic flow throughout.  There are mildly elevated velocities in the distal graft.  ARTERIAL DOPPLER: I have independently interpreted his arterial Doppler study today.  On the right side there is a triphasic dorsalis pedis and posterior tibial signal.  ABI is 98%.  Toe pressure is 145 mmHg.  On the left side there is a biphasic dorsalis pedis and posterior tibial signal.  ABI is 88%.  Toe pressure is 103 mmHg.  MEDICAL ISSUES:   PERIPHERAL VASCULAR DISEASE: His aortofemoral bypass graft and left femoropopliteal bypass graft are patent.  He is on Plavix and is on a statin.  I have encouraged him to stay as active as possible.  I have encouraged him to try to not go back on the tobacco.  I have ordered ABIs and a graft duplex in 1 year and I will see him back at that time.  He knows to call sooner if he has problems.  BILATERAL CAROTID BRUITS: He has bilateral carotid bruits.  His most recent duplex scan in February of last year showed no significant carotid disease.  I have ordered a follow-up carotid duplex scan in 1 year and I will see him back at that time.  Deitra Mayo [facility] [phone]

## 2020-02-07 ENCOUNTER — Other Ambulatory Visit: Payer: Self-pay | Admitting: *Deleted

## 2020-02-07 DIAGNOSIS — I739 Peripheral vascular disease, unspecified: Secondary | ICD-10-CM

## 2020-02-07 DIAGNOSIS — I6523 Occlusion and stenosis of bilateral carotid arteries: Secondary | ICD-10-CM

## 2020-02-13 ENCOUNTER — Ambulatory Visit: Payer: Medicare HMO | Attending: Internal Medicine

## 2020-02-13 DIAGNOSIS — Z23 Encounter for immunization: Secondary | ICD-10-CM | POA: Insufficient documentation

## 2020-02-13 NOTE — Progress Notes (Signed)
   Covid-19 Vaccination Clinic  Name:  Sean Saunders    MRN: [id number] DOB: [birth year]  02/13/2020  [name] was observed post Covid-19 immunization for 15 minutes without incident. He was provided with Vaccine Information Sheet and instruction to access the V-Safe system.   [name] was instructed to call 911 with any severe reactions post vaccine: Marland Kitchen Difficulty breathing  . Swelling of face and throat  . A fast heartbeat  . A bad rash all over body  . Dizziness and weakness

## 2020-02-21 DIAGNOSIS — I739 Peripheral vascular disease, unspecified: Secondary | ICD-10-CM | POA: Diagnosis not present

## 2020-02-21 DIAGNOSIS — L84 Corns and callosities: Secondary | ICD-10-CM | POA: Diagnosis not present

## 2020-02-21 DIAGNOSIS — L603 Nail dystrophy: Secondary | ICD-10-CM | POA: Diagnosis not present

## 2020-02-21 DIAGNOSIS — E1151 Type 2 diabetes mellitus with diabetic peripheral angiopathy without gangrene: Secondary | ICD-10-CM | POA: Diagnosis not present

## 2020-02-27 DIAGNOSIS — D320 Benign neoplasm of cerebral meninges: Secondary | ICD-10-CM | POA: Diagnosis not present

## 2020-03-03 DIAGNOSIS — D320 Benign neoplasm of cerebral meninges: Secondary | ICD-10-CM | POA: Diagnosis not present

## 2020-03-03 DIAGNOSIS — D329 Benign neoplasm of meninges, unspecified: Secondary | ICD-10-CM | POA: Diagnosis not present

## 2020-03-11 ENCOUNTER — Ambulatory Visit: Payer: Medicare HMO | Attending: Internal Medicine

## 2020-03-11 DIAGNOSIS — Z23 Encounter for immunization: Secondary | ICD-10-CM

## 2020-03-11 NOTE — Progress Notes (Signed)
   Covid-19 Vaccination Clinic  Name:  Sean Saunders    MRN: [id number] DOB: September 30, 1946  03/11/2020  [name] was observed post Covid-19 immunization for 15 minutes without incident. He was provided with Vaccine Information Sheet and instruction to access the V-Safe system.   [name] was instructed to call 911 with any severe reactions post vaccine: Marland Kitchen Difficulty breathing  . Swelling of face and throat  . A fast heartbeat  . A bad rash all over body  . Dizziness and weakness   Immunizations Administered    Name Date Dose VIS Date Route   Pfizer COVID-19 Vaccine 03/11/2020 10:30 AM 0.3 mL 11/22/2019 Intramuscular   Manufacturer: [employer], [employer]   Lot: [id number]   [place]: [id number]

## 2020-03-23 ENCOUNTER — Telehealth: Payer: Self-pay | Admitting: Family Medicine

## 2020-03-23 NOTE — Telephone Encounter (Signed)
Please advise 

## 2020-03-23 NOTE — Telephone Encounter (Signed)
Pt has a sinus infection and it's been going on for 4 weeks. Per pt, he has tried medication and nothing is working. Pt is asking, for a steroid shot and antibiotics called in to the pharmacy. Thanks

## 2020-03-24 ENCOUNTER — Other Ambulatory Visit: Payer: Self-pay | Admitting: Family Medicine

## 2020-03-24 MED ORDER — DOXYCYCLINE HYCLATE 100 MG PO TABS: 100.0000 mg | ORAL_TABLET | Freq: Two times a day (BID) | ORAL | 0 refills | Status: AC

## 2020-03-24 NOTE — Telephone Encounter (Signed)
Called patient and gave him the message and patient verbalized an understanding. 

## 2020-03-24 NOTE — Telephone Encounter (Signed)
Doxycycline sent to his pharmacy. He can come to the office to have a Depo-Medrol injection, 40 mg IM.  Please remind patient that if it is allergic abx will not help. Also recommend nasal saline irrigations as needed.  Thanks, BJ

## 2020-03-24 NOTE — Telephone Encounter (Signed)
Please see new message. Please advise. 

## 2020-03-24 NOTE — Telephone Encounter (Signed)
Pt called to check on his message. He is wondering if this can be done today.   Pt can be reached at [phone]   Pharmacy: [facility]: [id number]

## 2020-03-26 NOTE — Telephone Encounter (Signed)
Patient called in wanting to make a nurse visit appointment to get a sot because the antibiotics that was called in for him for his sinus infection is not working. The patient stated that Dr. Martinique told him to call in and make a nurse visit appointment.  When speaking with the patient he is coughing and wheezing and sounds stuffy.   I spoke with Roselyn Reef to let her know and I didn't see any notation from Martinique saying for him to come in the office.   I advised the patient that we are not seeing patient in the office with his symptoms and that he will have to go to an Urgent care.  He understood and is planning on going to an Urgent care.  Nothing further needed

## 2020-03-30 ENCOUNTER — Telehealth: Payer: Self-pay | Admitting: Family Medicine

## 2020-03-30 NOTE — Telephone Encounter (Signed)
Patient notified and verbalized understanding. 

## 2020-03-30 NOTE — Telephone Encounter (Signed)
Please advise 

## 2020-03-30 NOTE — Telephone Encounter (Signed)
This medication is to take max for 7 days. If symptoms still present it is most likely because it is not infectious but rather allergies.  Nasal irrigations with saline as needed and intranasal steroid spray recommended for now. If still symptomatic in 2 weeks, please arrange appt. Thanks, BJ

## 2020-03-30 NOTE — Telephone Encounter (Signed)
Left message to return call to office.

## 2020-03-30 NOTE — Telephone Encounter (Signed)
Pt is requesting a refill on Doxycycline 200 mg tablet. Pt uses [facility] on [employer]. Thanks

## 2020-03-31 DIAGNOSIS — D333 Benign neoplasm of cranial nerves: Secondary | ICD-10-CM | POA: Diagnosis not present

## 2020-03-31 DIAGNOSIS — D320 Benign neoplasm of cerebral meninges: Secondary | ICD-10-CM | POA: Diagnosis not present

## 2020-04-09 DIAGNOSIS — L821 Other seborrheic keratosis: Secondary | ICD-10-CM | POA: Diagnosis not present

## 2020-04-09 DIAGNOSIS — L814 Other melanin hyperpigmentation: Secondary | ICD-10-CM | POA: Diagnosis not present

## 2020-04-09 DIAGNOSIS — Z85828 Personal history of other malignant neoplasm of skin: Secondary | ICD-10-CM | POA: Diagnosis not present

## 2020-04-09 DIAGNOSIS — D485 Neoplasm of uncertain behavior of skin: Secondary | ICD-10-CM | POA: Diagnosis not present

## 2020-04-09 DIAGNOSIS — L570 Actinic keratosis: Secondary | ICD-10-CM | POA: Diagnosis not present

## 2020-04-09 DIAGNOSIS — D225 Melanocytic nevi of trunk: Secondary | ICD-10-CM | POA: Diagnosis not present

## 2020-04-14 ENCOUNTER — Telehealth: Payer: Self-pay | Admitting: Family Medicine

## 2020-04-14 NOTE — Chronic Care Management (AMB) (Signed)
  Chronic Care Management   Note  04/14/2020 Name: BRAYZEN WOJNAROWSKI MRN: [id number] DOB: [birth year]  [name] is a 74 y.o. year old male who is a primary care patient of Martinique, Malka So, MD. I reached out to [name] by phone today in response to a referral sent by Mr. KELVIS GUILD PCP, Martinique, Betty G, MD.   Mr. Arnn was given information about Chronic Care Management services today including:  1. CCM service includes personalized support from designated clinical staff supervised by his physician, including individualized plan of care and coordination with other care providers 2. 24/7 contact phone numbers for assistance for urgent and routine care needs. 3. Service will only be billed when office clinical staff spend 20 minutes or more in a month to coordinate care. 4. Only one practitioner may furnish and bill the service in a calendar month. 5. The patient may stop CCM services at any time (effective at the end of the month) by phone call to the office staff.   Patient agreed to services and verbal consent obtained.   Follow up plan:   [place]

## 2020-05-01 ENCOUNTER — Other Ambulatory Visit: Payer: Self-pay | Admitting: *Deleted

## 2020-05-01 DIAGNOSIS — I119 Hypertensive heart disease without heart failure: Secondary | ICD-10-CM

## 2020-05-01 DIAGNOSIS — J449 Chronic obstructive pulmonary disease, unspecified: Secondary | ICD-10-CM

## 2020-05-01 NOTE — Telephone Encounter (Signed)
Referral placed as requested.

## 2020-05-01 NOTE — Chronic Care Management (AMB) (Signed)
Chronic Care Management Pharmacy  Name: Sean Saunders  MRN: [id number] DOB: [birth year]  Chief Complaint/ HPI  [name],  74 y.o. , male presents for their Initial CCM visit with the clinical pharmacist via telephone due to COVID-19 Pandemic. Patient is starting to live together with his daughter and grand daughter. He has bad seasonal allergies and this makes him going outside difficult. He was previously admitted to the hospital due to to pancreatitis and he is doing well now. His is mostly concerned about his diabetes and high blood pressure.  PCP : Martinique, Betty G, MD  Their chronic conditions include: HTN, PAD, COPD, allergic rhinitis, type 2 diabetes with diabetic neuropathy,  Office Visits: 01/10/20 OV - hosp f/u due to acute pancreatitis. HCTZ d/c. Increased hydralazine 25 mg from BID to TID.  Consult Visit: 12/31/19 ED to hosp - acute pancreatitis.   Medications: Outpatient Encounter Medications as of 05/07/2020  Medication Sig  . amLODipine (NORVASC) 10 MG tablet Take 10 mg by mouth daily.  . clopidogrel (PLAVIX) 75 MG tablet Take 75 mg by mouth daily.    . fluticasone (FLONASE) 50 MCG/ACT nasal spray Place 2 sprays into both nostrils daily.  . furosemide (LASIX) 20 MG tablet Take 20 mg by mouth every other day.   . insulin regular human CONCENTRATED (HUMULIN R) 500 UNIT/ML SOLN injection Inject 40-80 Units into the skin See admin instructions. 70units in am 40units at lunch 80units at dinner  . lisinopril (ZESTRIL) 40 MG tablet Take 40 mg by mouth daily.  . metFORMIN (GLUCOPHAGE) 500 MG tablet Take 500 mg by mouth 2 (two) times daily with a meal.  . Multiple Vitamin (MULTIVITAMIN WITH MINERALS) TABS tablet Take 1 tablet by mouth daily.  . rosuvastatin (CRESTOR) 20 MG tablet Take 20 mg by mouth daily.  . TRELEGY ELLIPTA 100-62.5-25 MCG/INH AEPB INHALE 1 PUFF INTO THE LUNGS DAILY (Patient taking differently: Inhale 1 puff into the lungs daily. )  . allopurinol  (ZYLOPRIM) 100 MG tablet Take 100 mg by mouth 2 (two) times daily.  . hydrALAZINE (APRESOLINE) 25 MG tablet Take 1 tablet (25 mg total) by mouth 2 (two) times daily. (Patient taking differently: Take 25 mg by mouth 3 (three) times daily. )  . levothyroxine (SYNTHROID, LEVOTHROID) 150 MCG tablet Take 150 mcg by mouth daily before breakfast.  . rOPINIRole (REQUIP) 0.25 MG tablet Take 0.25 mg by mouth at bedtime.  . tamsulosin (FLOMAX) 0.4 MG CAPS capsule Take 0.4 mg by mouth.   No facility-administered encounter medications on file as of 05/07/2020.    Current Diagnosis/Assessment:  Goals Addressed            This Visit's Progress   . Chronic Care Management       CARE PLAN ENTRY  Current Barriers:  . Chronic Disease Management support, education, and care coordination needs related to Hypertension, Hyperlipidemia, and Diabetes   Hypertension . Pharmacist Clinical Goal(s): o Over the next 180 days, patient will work with PharmD and providers to achieve BP goal <140/90 . Current regimen:  . Amlodipine 10 mg 1 tablet once daily  . Furosemide 20 mg 1 tablet every other day  . Hydralazine 25 mg 1 tablet once daily 3 times a day . Lisinopril 40 mg 1 tablet once daily . Interventions: o Discussed diet and exercise extensively o Discussed the importance of checking blood pressure routinely . Patient self care activities - Over the next 180 days, patient will: o Check BP once a  week, document, and provide at future appointments o Ensure daily salt intake < 2300 mg/day o Continue walking half a mile 2-3 times a week  Hyperlipidemia . Pharmacist Clinical Goal(s): o Over the next 180 days, patient will work with PharmD and providers to achieve LDL goal < 100 . Current regimen:  o Rosuvastatin 20 mg 0.5 tablet once daily  . Interventions: o Discussed diet and exercise extensively . Patient self care activities - Over the next 180 days, patient will: o Continue walking half a mile 2-3  times a week o Continue eating more vegetables and control eating meat  Diabetes . Pharmacist Clinical Goal(s): o Over the next 180 days, patient will work with PharmD and providers to achieve A1c goal <7% . Current regimen:  . Humulin R 500 unit/ml 70 units AM, 40 units lunch, 80 units dinner . Metformin 500 mg 1 tablet twice a day with meals . Interventions: o Discussed diet and exercise extensively o Discussed the importance of checking blood sugar routinely . Patient self care activities - Over the next 180 days, patient will: o Check blood sugar 3-4 times daily, document, and provide at future appointments o Contact provider with any episodes of hypoglycemia o Continue walking 2-3 times a week for half a mile o Continue eating more vegetables and meat and cut down on carbohydrates  Medication management . Pharmacist Clinical Goal(s): o Over the next 180 days, patient will work with PharmD and providers to maintain optimal medication adherence . Current pharmacy: [facility] . Interventions o Comprehensive medication review performed. o Continue current medication management strategy . Patient self care activities - Over the next 180 days, patient will: o Take medications as prescribed o Report any questions or concerns to PharmD and/or provider(s)  Initial goal documentation        Hypertension   Office blood pressures are  BP Readings from Last 3 Encounters:  02/05/20 137/74  01/10/20 136/78  01/03/20 (!) 155/71   Patient has failed these meds in the past: felodipine, losartan, irbesartan, hctz, bystolic, valsartan Patient is currently controlled on the following medications:  . Amlodipine 10 mg 1 tablet once daily  . Furosemide 20 mg 1 tablet every other day  . Hydralazine 25 mg 1 tablet once daily 3 times a day . Lisinopril 40 mg 1 tablet once daily  Patient checks BP at home 1-2x per week   Patient home BP readings are ranging: 150/60-70  Patient reports  walking 203 times a walk for half a mile, but he has not been doing that for quite awhile now due to allergies. He reports not using salt to flavor his food. He mostly prepares his food and he does not go out to eat. Discussed diet and exercise extensively.   Plan  Continue control with diet and exercise     Hyperlipidemia   Lipid Panel     Component Value Date/Time   CHOL  02/09/2011 0500    111        ATP III CLASSIFICATION:  <200     mg/dL   Desirable  200-239  mg/dL   Borderline High  >=240    mg/dL   High          TRIG 194 (H) 01/01/2020 0424   HDL 34 (L) 02/09/2011 0500   CHOLHDL 3.3 02/09/2011 0500   VLDL 27 02/09/2011 0500   LDLCALC  02/09/2011 0500    50        Total Cholesterol/HDL:CHD Risk Coronary Heart  Disease Risk Table                     Men   Women  1/2 Average Risk   3.4   3.3  Average Risk       5.0   4.4  2 X Average Risk   9.6   7.1  3 X Average Risk  23.4   11.0        Use the calculated Patient Ratio above and the CHD Risk Table to determine the patient's CHD Risk.        ATP III CLASSIFICATION (LDL):  <100     mg/dL   Optimal  100-129  mg/dL   Near or Above                    Optimal  130-159  mg/dL   Borderline  160-189  mg/dL   High  >190     mg/dL   Very High     The ASCVD Risk score Mikey Bussing DC Jr., et al., 2013) failed to calculate for the following reasons:   Cannot find a previous HDL lab   Cannot find a previous total cholesterol lab   Patient has failed these meds in past: simvastatin, ezetimibe Patient is currently controlled on the following medications:  . Rosuvastatin 20 mg 0.5 tablet once daily   No current labs on record. Will need new lipid panel to assess further. Patient reports eating vegetables and meats in his diet and denies eating carbs due to sugar. Denies mylagias associated with statin.  Plan  Continue current medications and control with diet and exercise  Need to order lipid panel on his next OV    PAD    Patient has failed these meds in past: None Patient is currently controlled on the following medications:  . Clopidogrel 75 mg 1 tablet once daily   Rosuvastatin 20 mg 0.5 tablet once daily   Patient reports having lower extremity edema at least once a week, but controlled by taking furosemide daily. Patient does not express other concerns.  Plan  Continue current medications and control with diet and exercise'   Diabetes   Recent Relevant Labs: Lab Results  Component Value Date/Time   HGBA1C 7.7 (H) 12/31/2019 05:18 PM   HGBA1C 7.8 (H) 08/13/2011 06:30 AM   GFR 57.43 (L) 01/10/2020 12:23 PM   GFR 32.21 (L) 02/02/2018 09:21 AM     Checking BG: 4 times daily  Patient has failed these meds in past: Jardiance, humulin R, Ozempic, Januvia Patient is currently uncontrolled on the following medications:  . Humulin R 500 unit/ml 70 units AM, 40 units lunch, 80 units dinner . Metformin 500 mg 1 tablet twice a day with meals  Last diabetic Eye exam: No results found for: HMDIABEYEEXA  Last diabetic Foot exam: No results found for: HMDIABFOOTEX   Patient denies eating carbohydrates and reports only eating vegetables and meats. Patient also reports walking 2-3 times a week for half a mile as his main form of physical activity. Patient is still above goal of 7%. We discussed diet and exercise extensively.  Plan  Continue current medications and control with diet and exercise   COPD / Asthma / Tobacco   Last spirometry score: No record  Eosinophil count:   Lab Results  Component Value Date/Time   EOSPCT 3.2 01/10/2020 12:23 PM  %  Eos (Absolute):  Lab Results  Component Value Date/Time   EOSABS 0.3 01/10/2020 12:23 PM    Tobacco Status:  Social History   Tobacco Use  Smoking Status Former Smoker  . Packs/day: 1.00  . Years: 25.00  . Pack years: 25.00  . Types: Cigarettes  . Quit date: 06/05/2018  . Years since quitting: 1.9   Smokeless Tobacco Never Used    Patient has failed these meds in past: advair, symbicort, combivent, duoneb, spiriva Patient is currently controlled on the following medications:   Trelegy Ellipta 100-62.5-25 mcg/inh 1 puff into the lungs daily Using maintenance inhaler regularly? Yes Frequency of rescue inhaler use:  never  Patient complains of breathing due to allergies and not really from COPD. No record of spirometry on patient profile.  Plan  Continue current medications    Allergic Rhinitis   Patient has failed these meds in past: loratidine,  Patient is currently controlled on the following medications:  . Fluticasone 50 mcg/act 2 sprays into both nostrils daily  Patient reports taking flonase daily for bad allergies currently. Patient reports controlled symptoms with flonase.  Plan  Continue current medications    OTCs/Health Maintenance   Patient is currently controlled on the following medications: Marland Kitchen Multivitamins 1 tablet once daily   Patient denies any other OTC medications aside from his multivitamins.  Plan  Continue current medications   Vaccines   Reviewed and discussed patient's vaccination history.    Immunization History  Administered Date(s) Administered  . Fluad Quad(high Dose 65+) 08/13/2019  . Influenza, High Dose Seasonal PF 09/25/2017, 09/17/2018  . Influenza-Unspecified 11/11/2013  . PFIZER SARS-COV-2 Vaccination 02/13/2020, 03/11/2020  . Tdap 12/11/2018    Plan  Recommended patient receive shingrix vaccine in the office/pharmacy.   Medication Management   Pharmacy/Benefits: [facility] at [address] / [address] [place] current medication management strategy   Follow up: 6 month phone visit

## 2020-05-07 ENCOUNTER — Ambulatory Visit: Payer: Medicare HMO

## 2020-05-07 ENCOUNTER — Other Ambulatory Visit: Payer: Self-pay

## 2020-05-07 DIAGNOSIS — E1140 Type 2 diabetes mellitus with diabetic neuropathy, unspecified: Secondary | ICD-10-CM

## 2020-05-07 DIAGNOSIS — I119 Hypertensive heart disease without heart failure: Secondary | ICD-10-CM

## 2020-05-07 NOTE — Patient Instructions (Addendum)
Visit Information  Thank you for meeting with me to discuss your medications! I look forward to working with you to achieve your health care goals. Below is a summary of what we talked about during the visit:  Goals Addressed            This Visit's Progress   . Chronic Care Management       CARE PLAN ENTRY  Current Barriers:  . Chronic Disease Management support, education, and care coordination needs related to Hypertension, Hyperlipidemia, and Diabetes   Hypertension . Pharmacist Clinical Goal(s): o Over the next 180 days, patient will work with PharmD and providers to achieve BP goal <140/90 . Current regimen:  . Amlodipine 10 mg 1 tablet once daily  . Furosemide 20 mg 1 tablet every other day  . Hydralazine 25 mg 1 tablet once daily 3 times a day . Lisinopril 40 mg 1 tablet once daily . Interventions: o Discussed diet and exercise extensively o Discussed the importance of checking blood pressure routinely . Patient self care activities - Over the next 180 days, patient will: o Check BP once a week, document, and provide at future appointments o Ensure daily salt intake < 2300 mg/day o Continue walking half a mile 2-3 times a week  Hyperlipidemia . Pharmacist Clinical Goal(s): o Over the next 180 days, patient will work with PharmD and providers to achieve LDL goal < 100 . Current regimen:  o Rosuvastatin 20 mg 0.5 tablet once daily  . Interventions: o Discussed diet and exercise extensively . Patient self care activities - Over the next 180 days, patient will: o Continue walking half a mile 2-3 times a week o Continue eating more vegetables and control eating meat  Diabetes . Pharmacist Clinical Goal(s): o Over the next 180 days, patient will work with PharmD and providers to achieve A1c goal <7% . Current regimen:  . Humulin R 500 unit/ml 70 units AM, 40 units lunch, 80 units dinner . Metformin 500 mg 1 tablet twice a day with meals . Interventions: o Discussed  diet and exercise extensively o Discussed the importance of checking blood sugar routinely . Patient self care activities - Over the next 180 days, patient will: o Check blood sugar 3-4 times daily, document, and provide at future appointments o Contact provider with any episodes of hypoglycemia o Continue walking 2-3 times a week for half a mile o Continue eating more vegetables and meat and cut down on carbohydrates  Medication management . Pharmacist Clinical Goal(s): o Over the next 180 days, patient will work with PharmD and providers to maintain optimal medication adherence . Current pharmacy: [facility] . Interventions o Comprehensive medication review performed. o Continue current medication management strategy . Patient self care activities - Over the next 180 days, patient will: o Take medications as prescribed o Report any questions or concerns to PharmD and/or provider(s)  Initial goal documentation        Mr. Verdugo was given information about Chronic Care Management services today including:  1. CCM service includes personalized support from designated clinical staff supervised by his physician, including individualized plan of care and coordination with other care providers 2. 24/7 contact phone numbers for assistance for urgent and routine care needs. 3. Standard insurance, coinsurance, copays and deductibles apply for chronic care management only during months in which we provide at least 20 minutes of these services. Most insurances cover these services at 100%, however patients may be responsible for any copay, coinsurance and/or deductible  if applicable. This service may help you avoid the need for more expensive face-to-face services. 4. Only one practitioner may furnish and bill the service in a calendar month. 5. The patient may stop CCM services at any time (effective at the end of the month) by phone call to the office staff.  Patient agreed to services and  verbal consent obtained.   The patient verbalized understanding of instructions provided today and agreed to receive a mailed copy of patient instruction and/or educational materials. Telephone follow up appointment with pharmacy team member scheduled for: 11/03/20 at * AM . Geraldine Contras, PharmD Clinical Pharmacist [facility] [phone]    Allergic Rhinitis, Adult Allergic rhinitis is a reaction to allergens in the air. Allergens are tiny specks (particles) in the air that cause your body to have an allergic reaction. This condition cannot be passed from person to person (is not contagious). Allergic rhinitis cannot be cured, but it can be controlled. There are two types of allergic rhinitis:  Seasonal. This type is also called hay fever. It happens only during certain times of the year.  Perennial. This type can happen at any time of the year. What are the causes? This condition may be caused by:  Pollen from grasses, trees, and weeds.  House dust mites.  Pet dander.  Mold. What are the signs or symptoms? Symptoms of this condition include:  Sneezing.  Runny or stuffy nose (nasal congestion).  A lot of mucus in the back of the throat (postnasal drip).  Itchy nose.  Tearing of the eyes.  Trouble sleeping.  Being sleepy during day. How is this treated? There is no cure for this condition. You should avoid things that trigger your symptoms (allergens). Treatment can help to relieve symptoms. This may include:  Medicines that block allergy symptoms, such as antihistamines. These may be given as a shot, nasal spray, or pill.  Shots that are given until your body becomes less sensitive to the allergen (desensitization).  Stronger medicines, if all other treatments have not worked. Follow these instructions at home: Avoiding allergens   Find out what you are allergic to. Common allergens include smoke, dust, and pollen.  Avoid them if  you can. These are some of the things that you can do to avoid allergens: ? Replace carpet with wood, tile, or vinyl flooring. Carpet can trap dander and dust. ? Clean any mold found in the home. ? Do not smoke. Do not allow smoking in your home. ? Change your heating and air conditioning filter at least once a month. ? During allergy season:  Keep windows closed as much as you can. If possible, use air conditioning when there is a lot of pollen in the air.  Use a special filter for allergies with your furnace and air conditioner.  Plan outdoor activities when pollen counts are lowest. This is usually during the early morning or evening hours.  If you do go outdoors when pollen count is high, wear a special mask for people with allergies.  When you come indoors, take a shower and change your clothes before sitting on furniture or bedding. General instructions  Do not use fans in your home.  Do not hang clothes outside to dry.  Wear sunglasses to keep pollen out of your eyes.  Wash your hands right away after you touch household pets.  Take over-the-counter and prescription medicines only as told by your doctor.  Keep all follow-up visits as told by your  doctor. This is important. Contact a doctor if:  You have a fever.  You have a cough that does not go away (is persistent).  You start to make whistling sounds when you breathe (wheeze).  Your symptoms do not get better with treatment.  You have thick fluid coming from your nose.  You start to have nosebleeds. Get help right away if:  Your tongue or your lips are swollen.  You have trouble breathing.  You feel dizzy or you feel like you are going to pass out (faint).  You have cold sweats. Summary  Allergic rhinitis is a reaction to allergens in the air.  This condition may be caused by allergens. These include pollen, dust mites, pet dander, and mold.  Symptoms include a runny, itchy nose, sneezing, or tearing  eyes. You may also have trouble sleeping or feel sleepy during the day.  Treatment includes taking medicines and avoiding allergens. You may also get shots or take stronger medicines.  Get help if you have a fever or a cough that does not stop. Get help right away if you are short of breath. This information is not intended to replace advice given to you by your health care provider. Make sure you discuss any questions you have with your health care provider. Document Revised: 03/19/2019 Document Reviewed: 06/19/2018 Elsevier Patient Education  [place].

## 2020-05-08 ENCOUNTER — Telehealth: Payer: Self-pay | Admitting: Family Medicine

## 2020-05-08 NOTE — Telephone Encounter (Signed)
Patient is wanting for Dr. Martinique to call him in come medication for a sinus infection and a cough.  [facility] [place], [place] AT [facility] [address] Phone:  [phone]  Fax:  [phone]

## 2020-05-08 NOTE — Telephone Encounter (Signed)
Patient informed that PCP is out of the office until 05/18/2020. Tried calling other offices, unable to get patient in with someone else today due to booked schedules. Patient advised to go to urgent care if he did not want to wait until Tuesday to be seen by a provider in this office.

## 2020-05-12 ENCOUNTER — Telehealth: Payer: Self-pay | Admitting: Family Medicine

## 2020-05-12 ENCOUNTER — Telehealth (INDEPENDENT_AMBULATORY_CARE_PROVIDER_SITE_OTHER): Payer: Medicare HMO | Admitting: Family Medicine

## 2020-05-12 VITALS — BP 120/68 | Temp 97.2°F | Ht 72.0 in | Wt 225.0 lb

## 2020-05-12 DIAGNOSIS — J449 Chronic obstructive pulmonary disease, unspecified: Secondary | ICD-10-CM | POA: Diagnosis not present

## 2020-05-12 DIAGNOSIS — J321 Chronic frontal sinusitis: Secondary | ICD-10-CM

## 2020-05-12 MED ORDER — PREDNISONE 50 MG PO TABS: ORAL_TABLET | ORAL | 0 refills | Status: DC

## 2020-05-12 MED ORDER — AMOXICILLIN-POT CLAVULANATE 875-125 MG PO TABS: 1.0000 | ORAL_TABLET | Freq: Two times a day (BID) | ORAL | 0 refills | Status: DC

## 2020-05-12 NOTE — Telephone Encounter (Signed)
Pt wanting to let provider know that his sinus issue is worse and is coughing up green mucus and head very congested.   Pharm:  [facility] on [address] and [address]

## 2020-05-12 NOTE — Telephone Encounter (Signed)
Patient scheduled with Dr. Morene Rankins at 12 pm today. Nothing further needed at this time.

## 2020-05-12 NOTE — Progress Notes (Signed)
   Sean Saunders is a 74 y.o. male who presents today for a virtual office visit.  Assessment/Plan:  Chronic Problems Addressed Today: COPD With mild flare today.  No red flags.  Will start Augmentin and prednisone as this is worked well for him in the past.  Discussed reasons to return to care and seek emergent care.  He will follow-up as needed.    Subjective:  HPI:  Symptoms started 5-6 days ago. Symptoms include rhinorrhea, facial pressure, cough, sore throat, phlegm production. Worse in the evening. No sick contacts. No fevers or chills. Feels like previous sinus infections. Tired OTC medicationswith no significant improvement.        Objective/Observations  Physical Exam: Gen: NAD, resting comfortably Pulm: Normal work of breathing Neuro: Grossly normal, moves all extremities Psych: Normal affect and thought content  Virtual Visit via Video   I connected with Sean Saunders on 05/12/20 at 12:00 PM EDT by a video enabled telemedicine application and verified that I am speaking with the correct person using two identifiers. The limitations of evaluation and management by telemedicine and the availability of in person appointments were discussed. The patient expressed understanding and agreed to proceed.   Patient location: Home Provider location: [place] participating in the virtual visit: Myself and Patient     Sean Saunders. Jerline Pain, MD 05/12/2020 11:46 AM

## 2020-06-16 ENCOUNTER — Telehealth (INDEPENDENT_AMBULATORY_CARE_PROVIDER_SITE_OTHER): Payer: Medicare HMO | Admitting: Family Medicine

## 2020-06-16 DIAGNOSIS — R05 Cough: Secondary | ICD-10-CM | POA: Diagnosis not present

## 2020-06-16 DIAGNOSIS — J42 Unspecified chronic bronchitis: Secondary | ICD-10-CM | POA: Diagnosis not present

## 2020-06-16 DIAGNOSIS — R059 Cough, unspecified: Secondary | ICD-10-CM

## 2020-06-16 DIAGNOSIS — Z794 Long term (current) use of insulin: Secondary | ICD-10-CM

## 2020-06-16 DIAGNOSIS — E1140 Type 2 diabetes mellitus with diabetic neuropathy, unspecified: Secondary | ICD-10-CM | POA: Diagnosis not present

## 2020-06-16 MED ORDER — BENZONATATE 100 MG PO CAPS
100.0000 mg | ORAL_CAPSULE | Freq: Two times a day (BID) | ORAL | 0 refills | Status: DC | PRN
Start: 2020-06-16 — End: 2020-07-01

## 2020-06-16 MED ORDER — PREDNISONE 20 MG PO TABS: 40.0000 mg | ORAL_TABLET | Freq: Every day | ORAL | 0 refills | Status: DC

## 2020-06-16 NOTE — Progress Notes (Signed)
Virtual Visit via Video Note  I connected with Sean Saunders  on 06/16/20 at 10:40 AM EDT by a video enabled telemedicine application and verified that I am speaking with the correct person using two identifiers.   Location patient: home, [place] Location provider:work or home office Persons participating in the virtual visit: patient, provider  I discussed the limitations of evaluation and management by telemedicine and the availability of in person appointments. The patient expressed understanding and agreed to proceed.   HPI:  Acute visit for a cough: -started 4-5 days ago -symptoms include a hacking cough, feels like he has clear sinus congestion, drainage, and wheezing in his chest, some SOB -denies fevers, body aches, NVD, loss of tast -he is fully vaccinated for COVID 19 -he is pretty isolated, not around many people -has been using his albuterol, alb not helping -checks his BS 4-5 times per day, they have been in 150-200s - reports this is normal for him -reports has to take prednisone for this usually and has to use sliding scale insulin for blood sugars - has diabetes specialists for assistance  ROS: See pertinent positives and negatives per HPI.  Past Medical History:  Diagnosis Date  . Arthritis   . Bronchitis    hx of  . COPD (chronic obstructive pulmonary disease) ([place])   . Diabetes mellitus   . DVT (deep venous thrombosis) ([place])   . GERD (gastroesophageal reflux disease)   . Hyperlipidemia   . Hypertension   . Hypothyroidism   . Onychomycosis 06/07/2013  . Stroke [facility])    TIA's 01/05/11-01/08/11-01/26/11  . Thyroid disease   . TIA (transient ischemic attack) 08/03/2011   Left sided weakness. 3 episodes Feb-March  . TIA (transient ischemic attack)    x 3    Past Surgical History:  Procedure Laterality Date  . ABDOMINAL AORTAGRAM N/A 06/04/2012   Procedure: ABDOMINAL Maxcine Ham;  Surgeon: Angelia Mould, MD;  Location: [facility] CATH LAB;  Service: Cardiovascular;   Laterality: N/A;  . APPENDECTOMY    . COLONOSCOPY    . FEMORAL-POPLITEAL BYPASS GRAFT  07/05/2012   Procedure: BYPASS GRAFT FEMORAL-POPLITEAL ARTERY;  Surgeon: Angelia Mould, MD;  Location: [facility] OR;  Service: Vascular;  Laterality: Left;  Revision Left fem/pop w/ artery patch angioplasty utilizing left GSV graft and extenion to tib/per trunk w/right GSV and interop arteriogram  . JOINT REPLACEMENT  2011   -Hip  . NASAL SINUS SURGERY    . PR VEIN BYPASS GRAFT,AORTO-FEM-POP    . PR VEIN BYPASS GRAFT,AORTO-FEM-POP  08-01-11   (L) Fem-pop BP  . toe nail    . TONSILLECTOMY      Family History  Problem Relation Age of Onset  . Stroke Mother   . Diabetes Mother   . Other Father        alzheimers  . Diabetes Father   . Stroke Father   . Diabetes Sister     SOCIAL HX: see hpi   Current Outpatient Medications:  .  allopurinol (ZYLOPRIM) 100 MG tablet, Take 100 mg by mouth 2 (two) times daily., Disp: , Rfl:  .  amLODipine (NORVASC) 10 MG tablet, Take 10 mg by mouth daily., Disp: , Rfl:  .  benzonatate (TESSALON PERLES) 100 MG capsule, Take 1 capsule (100 mg total) by mouth 2 (two) times daily as needed., Disp: 20 capsule, Rfl: 0 .  clopidogrel (PLAVIX) 75 MG tablet, Take 75 mg by mouth daily.  , Disp: , Rfl:  .  fluticasone (FLONASE) 50 MCG/ACT  nasal spray, Place 2 sprays into both nostrils daily., Disp: 16 g, Rfl: 5 .  furosemide (LASIX) 20 MG tablet, Take 20 mg by mouth every other day. , Disp: , Rfl:  .  hydrALAZINE (APRESOLINE) 25 MG tablet, Take 1 tablet (25 mg total) by mouth 2 (two) times daily. (Patient taking differently: Take 25 mg by mouth 3 (three) times daily. ), Disp: 90 tablet, Rfl: 1 .  insulin regular human CONCENTRATED (HUMULIN R) 500 UNIT/ML SOLN injection, Inject 40-80 Units into the skin See admin instructions. 70units in am 40units at lunch 80units at dinner, Disp: , Rfl:  .  levothyroxine (SYNTHROID, LEVOTHROID) 150 MCG tablet, Take 150 mcg by mouth daily before  breakfast., Disp: , Rfl:  .  lisinopril (ZESTRIL) 40 MG tablet, Take 40 mg by mouth daily., Disp: , Rfl:  .  metFORMIN (GLUCOPHAGE) 500 MG tablet, Take 500 mg by mouth 2 (two) times daily with a meal., Disp: , Rfl:  .  Multiple Vitamin (MULTIVITAMIN WITH MINERALS) TABS tablet, Take 1 tablet by mouth daily., Disp: , Rfl:  .  predniSONE (DELTASONE) 20 MG tablet, Take 2 tablets (40 mg total) by mouth daily with breakfast., Disp: 8 tablet, Rfl: 0 .  rOPINIRole (REQUIP) 0.25 MG tablet, Take 0.25 mg by mouth at bedtime., Disp: , Rfl:  .  rosuvastatin (CRESTOR) 20 MG tablet, Take 20 mg by mouth daily., Disp: , Rfl:  .  tamsulosin (FLOMAX) 0.4 MG CAPS capsule, Take 0.4 mg by mouth., Disp: , Rfl:   EXAM:  VITALS per patient if applicable:  GENERAL: alert, oriented, no audible sounds of distress  NECK: normal movements of the head and neck  LUNGS: no sounds of respiratory distress, , hacking cough during visit  PSYCH/NEURO: pleasant and cooperative, no obvious depression or anxiety, speech and thought processing grossly intact  ASSESSMENT AND PLAN:  Discussed the following assessment and plan:  Cough  Chronic bronchitis, unspecified chronic bronchitis type ([place])  Type 2 diabetes mellitus with diabetic neuropathy, with long-term current use of insulin ([place])  -we discussed possible serious and likely etiologies, options for evaluation and workup, limitations of telemedicine visit vs in person visit, treatment, treatment risks and precautions. Pt prefers to treat via telemedicine empirically rather then risking or undertaking an in person visit at this moment. Suspect bronchitis or COPD flare vs other. He is vaccinated for COVID19, discussed unlikely but not 100 impossible. Discussed treatment options with steroids and impact on his diabetes, current BS and his sliding scale. He has opted for a burst of prednisone 40mg  daily x4 days with alb prn and tessalon for cough. He agrees to monitor BS and  use contact specialist if any issues with his BS on the prednisone.Patient agrees to seek prompt in person care if worsening, new symptoms arise, or if is not improving with treatment.   I discussed the assessment and treatment plan with the patient. The patient was provided an opportunity to ask questions and all were answered. The patient agreed with the plan and demonstrated an understanding of the instructions.   The patient was advised to call back or seek an in-person evaluation if the symptoms worsen or if the condition fails to improve as anticipated.   Lucretia Kern, DO

## 2020-06-26 ENCOUNTER — Inpatient Hospital Stay (HOSPITAL_COMMUNITY)
Admission: EM | Admit: 2020-06-26 | Discharge: 2020-07-01 | DRG: 190 | Disposition: A | Discharge status: Home / Self Care | Payer: Medicare HMO | Attending: Family Medicine | Admitting: Family Medicine

## 2020-06-26 ENCOUNTER — Emergency Department (HOSPITAL_COMMUNITY): Payer: Medicare HMO

## 2020-06-26 ENCOUNTER — Encounter (HOSPITAL_COMMUNITY): Payer: Self-pay

## 2020-06-26 ENCOUNTER — Telehealth: Payer: Self-pay | Admitting: Family Medicine

## 2020-06-26 ENCOUNTER — Other Ambulatory Visit: Payer: Self-pay

## 2020-06-26 DIAGNOSIS — R0602 Shortness of breath: Secondary | ICD-10-CM | POA: Diagnosis not present

## 2020-06-26 DIAGNOSIS — N182 Chronic kidney disease, stage 2 (mild): Secondary | ICD-10-CM | POA: Diagnosis present

## 2020-06-26 DIAGNOSIS — Z66 Do not resuscitate: Secondary | ICD-10-CM | POA: Diagnosis not present

## 2020-06-26 DIAGNOSIS — R062 Wheezing: Secondary | ICD-10-CM | POA: Diagnosis not present

## 2020-06-26 DIAGNOSIS — K219 Gastro-esophageal reflux disease without esophagitis: Secondary | ICD-10-CM | POA: Diagnosis present

## 2020-06-26 DIAGNOSIS — I248 Other forms of acute ischemic heart disease: Secondary | ICD-10-CM | POA: Diagnosis not present

## 2020-06-26 DIAGNOSIS — Z833 Family history of diabetes mellitus: Secondary | ICD-10-CM

## 2020-06-26 DIAGNOSIS — I129 Hypertensive chronic kidney disease with stage 1 through stage 4 chronic kidney disease, or unspecified chronic kidney disease: Secondary | ICD-10-CM | POA: Diagnosis present

## 2020-06-26 DIAGNOSIS — F418 Other specified anxiety disorders: Secondary | ICD-10-CM | POA: Diagnosis present

## 2020-06-26 DIAGNOSIS — J209 Acute bronchitis, unspecified: Secondary | ICD-10-CM | POA: Diagnosis present

## 2020-06-26 DIAGNOSIS — E1151 Type 2 diabetes mellitus with diabetic peripheral angiopathy without gangrene: Secondary | ICD-10-CM | POA: Diagnosis present

## 2020-06-26 DIAGNOSIS — D631 Anemia in chronic kidney disease: Secondary | ICD-10-CM | POA: Diagnosis present

## 2020-06-26 DIAGNOSIS — Z8673 Personal history of transient ischemic attack (TIA), and cerebral infarction without residual deficits: Secondary | ICD-10-CM | POA: Diagnosis not present

## 2020-06-26 DIAGNOSIS — N179 Acute kidney failure, unspecified: Secondary | ICD-10-CM | POA: Diagnosis not present

## 2020-06-26 DIAGNOSIS — J441 Chronic obstructive pulmonary disease with (acute) exacerbation: Secondary | ICD-10-CM | POA: Diagnosis present

## 2020-06-26 DIAGNOSIS — M1990 Unspecified osteoarthritis, unspecified site: Secondary | ICD-10-CM | POA: Diagnosis present

## 2020-06-26 DIAGNOSIS — E1165 Type 2 diabetes mellitus with hyperglycemia: Secondary | ICD-10-CM | POA: Diagnosis present

## 2020-06-26 DIAGNOSIS — R079 Chest pain, unspecified: Secondary | ICD-10-CM

## 2020-06-26 DIAGNOSIS — I739 Peripheral vascular disease, unspecified: Secondary | ICD-10-CM | POA: Diagnosis present

## 2020-06-26 DIAGNOSIS — E785 Hyperlipidemia, unspecified: Secondary | ICD-10-CM | POA: Diagnosis present

## 2020-06-26 DIAGNOSIS — N183 Chronic kidney disease, stage 3 unspecified: Secondary | ICD-10-CM | POA: Diagnosis present

## 2020-06-26 DIAGNOSIS — R05 Cough: Secondary | ICD-10-CM | POA: Diagnosis not present

## 2020-06-26 DIAGNOSIS — J9690 Respiratory failure, unspecified, unspecified whether with hypoxia or hypercapnia: Secondary | ICD-10-CM | POA: Diagnosis not present

## 2020-06-26 DIAGNOSIS — Z820 Family history of epilepsy and other diseases of the nervous system: Secondary | ICD-10-CM

## 2020-06-26 DIAGNOSIS — E669 Obesity, unspecified: Secondary | ICD-10-CM | POA: Diagnosis present

## 2020-06-26 DIAGNOSIS — R509 Fever, unspecified: Secondary | ICD-10-CM | POA: Diagnosis not present

## 2020-06-26 DIAGNOSIS — J9601 Acute respiratory failure with hypoxia: Secondary | ICD-10-CM | POA: Diagnosis present

## 2020-06-26 DIAGNOSIS — E039 Hypothyroidism, unspecified: Secondary | ICD-10-CM | POA: Diagnosis present

## 2020-06-26 DIAGNOSIS — E1122 Type 2 diabetes mellitus with diabetic chronic kidney disease: Secondary | ICD-10-CM | POA: Diagnosis present

## 2020-06-26 DIAGNOSIS — N189 Chronic kidney disease, unspecified: Secondary | ICD-10-CM | POA: Diagnosis not present

## 2020-06-26 DIAGNOSIS — J449 Chronic obstructive pulmonary disease, unspecified: Secondary | ICD-10-CM | POA: Diagnosis not present

## 2020-06-26 DIAGNOSIS — J440 Chronic obstructive pulmonary disease with acute lower respiratory infection: Secondary | ICD-10-CM | POA: Diagnosis present

## 2020-06-26 DIAGNOSIS — Z79899 Other long term (current) drug therapy: Secondary | ICD-10-CM

## 2020-06-26 DIAGNOSIS — Z794 Long term (current) use of insulin: Secondary | ICD-10-CM

## 2020-06-26 DIAGNOSIS — Z823 Family history of stroke: Secondary | ICD-10-CM

## 2020-06-26 DIAGNOSIS — R000 Tachycardia, unspecified: Secondary | ICD-10-CM | POA: Diagnosis not present

## 2020-06-26 DIAGNOSIS — D333 Benign neoplasm of cranial nerves: Secondary | ICD-10-CM | POA: Diagnosis present

## 2020-06-26 DIAGNOSIS — Z20822 Contact with and (suspected) exposure to covid-19: Secondary | ICD-10-CM | POA: Diagnosis not present

## 2020-06-26 DIAGNOSIS — I700 Atherosclerosis of aorta: Secondary | ICD-10-CM | POA: Diagnosis not present

## 2020-06-26 DIAGNOSIS — E1140 Type 2 diabetes mellitus with diabetic neuropathy, unspecified: Secondary | ICD-10-CM | POA: Diagnosis present

## 2020-06-26 DIAGNOSIS — J439 Emphysema, unspecified: Secondary | ICD-10-CM | POA: Diagnosis not present

## 2020-06-26 DIAGNOSIS — M109 Gout, unspecified: Secondary | ICD-10-CM | POA: Diagnosis present

## 2020-06-26 DIAGNOSIS — Z6832 Body mass index (BMI) 32.0-32.9, adult: Secondary | ICD-10-CM

## 2020-06-26 DIAGNOSIS — Z79890 Hormone replacement therapy (postmenopausal): Secondary | ICD-10-CM

## 2020-06-26 DIAGNOSIS — Z7902 Long term (current) use of antithrombotics/antiplatelets: Secondary | ICD-10-CM

## 2020-06-26 LAB — COMPREHENSIVE METABOLIC PANEL
ALT: 28 U/L (ref 0–44)
AST: 23 U/L (ref 15–41)
Albumin: 3.4 g/dL — ABNORMAL LOW (ref 3.5–5.0)
Alkaline Phosphatase: 70 U/L (ref 38–126)
Anion gap: 11 (ref 5–15)
BUN: 21 mg/dL (ref 8–23)
CO2: 21 mmol/L — ABNORMAL LOW (ref 22–32)
Calcium: 8.8 mg/dL — ABNORMAL LOW (ref 8.9–10.3)
Chloride: 105 mmol/L (ref 98–111)
Creatinine, Ser: 1.25 mg/dL — ABNORMAL HIGH (ref 0.61–1.24)
GFR calc Af Amer: >60 mL/min (ref >60)
GFR calc non Af Amer: 56 mL/min — ABNORMAL LOW (ref >60)
Glucose, Bld: 202 mg/dL — ABNORMAL HIGH (ref 70–99)
Potassium: 4.3 mmol/L (ref 3.5–5.1)
Sodium: 137 mmol/L (ref 135–145)
Total Bilirubin: 0.7 mg/dL (ref 0.3–1.2)
Total Protein: 6.3 g/dL — ABNORMAL LOW (ref 6.5–8.1)

## 2020-06-26 LAB — CBC WITH DIFFERENTIAL/PLATELET
Abs Immature Granulocytes: 0.02 10*3/uL (ref 0.00–0.07)
Basophils Absolute: 0 10*3/uL (ref 0.0–0.1)
Basophils Relative: 0 %
Eosinophils Absolute: 0.1 10*3/uL (ref 0.0–0.5)
Eosinophils Relative: 1 %
HCT: 34.7 % — ABNORMAL LOW (ref 39.0–52.0)
Hemoglobin: 11.2 g/dL — ABNORMAL LOW (ref 13.0–17.0)
Immature Granulocytes: 0 %
Lymphocytes Relative: 18 %
Lymphs Abs: 1.7 10*3/uL (ref 0.7–4.0)
MCH: 28.4 pg (ref 26.0–34.0)
MCHC: 32.3 g/dL (ref 30.0–36.0)
MCV: 88.1 fL (ref 80.0–100.0)
Monocytes Absolute: 1.1 10*3/uL — ABNORMAL HIGH (ref 0.1–1.0)
Monocytes Relative: 11 %
Neutro Abs: 6.5 10*3/uL (ref 1.7–7.7)
Neutrophils Relative %: 70 %
Platelets: 191 10*3/uL (ref 150–400)
RBC: 3.94 MIL/uL — ABNORMAL LOW (ref 4.22–5.81)
RDW: 14.6 % (ref 11.5–15.5)
WBC: 9.4 10*3/uL (ref 4.0–10.5)
nRBC: 0 % (ref 0.0–0.2)

## 2020-06-26 LAB — LACTIC ACID, PLASMA: Lactic Acid, Venous: 1.3 mmol/L (ref 0.5–1.9)

## 2020-06-26 LAB — CBG MONITORING, ED: Glucose-Capillary: 180 mg/dL — ABNORMAL HIGH (ref 70–99)

## 2020-06-26 MED ORDER — ACETAMINOPHEN 325 MG PO TABS
650.0000 mg | ORAL_TABLET | Freq: Once | ORAL | Status: AC | PRN
  Administered 2020-06-26: 650 mg via ORAL
  Filled 2020-06-26: qty 2

## 2020-06-26 MED ORDER — LACTATED RINGERS IV BOLUS
1000.0000 mL | Freq: Once | INTRAVENOUS | Status: AC
  Administered 2020-06-26: 1000 mL via INTRAVENOUS

## 2020-06-26 MED ORDER — MAGNESIUM SULFATE IN D5W 1-5 GM/100ML-% IV SOLN
1.0000 g | Freq: Once | INTRAVENOUS | Status: AC
  Administered 2020-06-26: 1 g via INTRAVENOUS
  Filled 2020-06-26: qty 100

## 2020-06-26 MED ORDER — MAGNESIUM SULFATE 50 % IJ SOLN: 1.0000 g | Freq: Once | INTRAMUSCULAR | Status: DC

## 2020-06-26 MED ORDER — ALBUTEROL SULFATE HFA 108 (90 BASE) MCG/ACT IN AERS
8.0000 | INHALATION_SPRAY | RESPIRATORY_TRACT | Status: AC
  Administered 2020-06-26 (×3): 8 via RESPIRATORY_TRACT
  Filled 2020-06-26: qty 6.7

## 2020-06-26 MED ORDER — IPRATROPIUM BROMIDE HFA 17 MCG/ACT IN AERS
4.0000 | INHALATION_SPRAY | RESPIRATORY_TRACT | Status: AC
  Administered 2020-06-26 – 2020-06-27 (×3): 4 via RESPIRATORY_TRACT
  Filled 2020-06-26: qty 12.9

## 2020-06-26 MED ORDER — SODIUM CHLORIDE 0.9 % IV SOLN
100.0000 mg | Freq: Once | INTRAVENOUS | Status: AC
  Administered 2020-06-27: 100 mg via INTRAVENOUS
  Filled 2020-06-26: qty 100

## 2020-06-26 NOTE — Telephone Encounter (Signed)
I spoke with patient. We went over the information below, he verbalized understanding & will head to the emergency room.

## 2020-06-26 NOTE — Telephone Encounter (Signed)
He has bad allergies and COPD. Antibiotic will not help if there is not a bacterial infection. If symptoms are not getting worse, he can give it some time. If worsening symptoms and/or fever,SOB,CP he needs to be evaluated in the ER when chest X ray can be done. Thanks, BJ

## 2020-06-26 NOTE — ED Provider Notes (Signed)
[facility] DEPT Provider Note   CSN: [id number] Arrival date & time: 06/26/20  2134     History Chief Complaint  Patient presents with  . Shortness of Breath    Sean Saunders is a 74 y.o. male.   Shortness of Breath Severity:  Moderate Onset quality:  Gradual Timing:  Constant Progression:  Worsening Chronicity:  Chronic Context: URI   Relieved by:  Nothing Worsened by:  Nothing Ineffective treatments: cough drops and decongestents. Associated symptoms: cough, fever, sputum production and wheezing   Associated symptoms: no chest pain, no headaches, no rash and no vomiting        Past Medical History:  Diagnosis Date  . Arthritis   . Bronchitis    hx of  . COPD (chronic obstructive pulmonary disease) ([place])   . Diabetes mellitus   . DVT (deep venous thrombosis) ([place])   . GERD (gastroesophageal reflux disease)   . Hyperlipidemia   . Hypertension   . Hypothyroidism   . Onychomycosis 06/07/2013  . Stroke [facility])    TIA's 01/05/11-01/08/11-01/26/11  . Thyroid disease   . TIA (transient ischemic attack) 08/03/2011   Left sided weakness. 3 episodes Feb-March  . TIA (transient ischemic attack)    x 3    Patient Active Problem List   Diagnosis Date Noted  . Acute respiratory failure with hypoxia ([place]) 06/27/2020  . COPD with acute exacerbation ([place]) 06/27/2020  . Fever   . Embolism and thrombosis of arteries of lower extremity ([place]) 01/10/2020  . Cholelithiasis 12/31/2019  . Acute pancreatitis 12/31/2019  . Chronic sinusitis 09/03/2019  . CKD (chronic kidney disease), stage III 09/17/2018  . Meningioma, cerebral ([place]) 09/17/2018  . Vestibular schwannoma ([place]) 09/17/2018  . Rhinitis, allergic 09/25/2017  . Tobacco use disorder 09/11/2017  . COPD (chronic obstructive pulmonary disease) ([place]) 09/10/2017  . Hypertension with heart disease 09/04/2017  . Pain in lower limb 03/21/2014  . Type 2 diabetes mellitus with diabetic  neuropathy, unspecified ([place]) 03/21/2014  . PVD (peripheral vascular disease) ([place]) 12/11/2013  . Occlusion and stenosis of carotid artery without mention of cerebral infarction 12/11/2013  . Onychomycosis 06/07/2013  . Pain in joint, ankle and foot 06/07/2013  . Callus of foot 06/07/2013  . Peripheral vascular disease, unspecified ([place]) 05/29/2013  . Aftercare following surgery of the circulatory system, [place] 05/29/2013  . Other postoperative infection 11/13/2012  . Neuropathic pain of lower extremity 11/23/2011  . Atherosclerosis of native arteries of the extremities with intermittent claudication 10/12/2011    Past Surgical History:  Procedure Laterality Date  . ABDOMINAL AORTAGRAM N/A 06/04/2012   Procedure: ABDOMINAL Maxcine Ham;  Surgeon: Angelia Mould, MD;  Location: [facility] CATH LAB;  Service: Cardiovascular;  Laterality: N/A;  . APPENDECTOMY    . COLONOSCOPY    . FEMORAL-POPLITEAL BYPASS GRAFT  07/05/2012   Procedure: BYPASS GRAFT FEMORAL-POPLITEAL ARTERY;  Surgeon: Angelia Mould, MD;  Location: [facility] OR;  Service: Vascular;  Laterality: Left;  Revision Left fem/pop w/ artery patch angioplasty utilizing left GSV graft and extenion to tib/per trunk w/right GSV and interop arteriogram  . JOINT REPLACEMENT  2011   -Hip  . NASAL SINUS SURGERY    . PR VEIN BYPASS GRAFT,AORTO-FEM-POP    . PR VEIN BYPASS GRAFT,AORTO-FEM-POP  08-01-11   (L) Fem-pop BP  . toe nail    . TONSILLECTOMY         Family History  Problem Relation Age of Onset  . Stroke Mother   . Diabetes  Mother   . Other Father        alzheimers  . Diabetes Father   . Stroke Father   . Diabetes Sister     Social History   Tobacco Use  . Smoking status: Former Smoker    Packs/day: 1.00    Years: 25.00    Pack years: 25.00    Types: Cigarettes    Quit date: 06/05/2018    Years since quitting: 2.0  . Smokeless tobacco: Never Used  Vaping Use  . Vaping Use: Never used  Substance Use Topics  . Alcohol  use: Yes    Alcohol/week: 0.0 standard drinks    Comment: rare  . Drug use: No    Home Medications Prior to Admission medications   Medication Sig Start Date End Date Taking? Authorizing Provider  allopurinol (ZYLOPRIM) 100 MG tablet Take 100 mg by mouth 2 (two) times daily.   Yes [provider]  amLODipine (NORVASC) 10 MG tablet Take 10 mg by mouth daily.   Yes [provider]  clopidogrel (PLAVIX) 75 MG tablet Take 75 mg by mouth daily.     Yes [provider]  COMBIVENT RESPIMAT 20-100 MCG/ACT AERS respimat Inhale 2 puffs into the lungs 3 (three) times daily. 04/19/20  Yes [provider]  fluticasone (FLONASE) 50 MCG/ACT nasal spray Place 2 sprays into both nostrils daily. 08/07/19  Yes Martinique, Betty G, MD  furosemide (LASIX) 20 MG tablet Take 20 mg by mouth every other day.    Yes [provider]  hydrALAZINE (APRESOLINE) 50 MG tablet Take 50 mg by mouth every morning. 05/08/20  Yes [provider]  hydrochlorothiazide (HYDRODIURIL) 25 MG tablet Take 25 mg by mouth daily. 06/07/20  Yes [provider]  insulin regular human CONCENTRATED (HUMULIN R) 500 UNIT/ML injection Inject 110-140 Units into the skin See admin instructions. 110 units at breakfast, 140 units at lunch, 110 units at dinner   Yes [provider]  levothyroxine (SYNTHROID, LEVOTHROID) 150 MCG tablet Take 150 mcg by mouth daily before breakfast.   Yes [provider]  lisinopril (ZESTRIL) 40 MG tablet Take 40 mg by mouth daily.   Yes [provider]  metFORMIN (GLUCOPHAGE) 500 MG tablet Take 500 mg by mouth 2 (two) times daily with a meal.   Yes [provider]  Multiple Vitamin (MULTIVITAMIN WITH MINERALS) TABS tablet Take 1 tablet by mouth daily.   Yes [provider]  rOPINIRole (REQUIP) 0.25 MG tablet Take 0.25 mg by mouth at bedtime.   Yes [provider]  rosuvastatin (CRESTOR) 40 MG tablet Take 20 mg by  mouth daily.  04/06/20  Yes [provider]  tamsulosin (FLOMAX) 0.4 MG CAPS capsule Take 0.4 mg by mouth.   Yes [provider]  benzonatate (TESSALON PERLES) 100 MG capsule Take 1 capsule (100 mg total) by mouth 2 (two) times daily as needed. Patient not taking: Reported on 06/27/2020 06/16/20   Lucretia Kern, DO    Allergies    Eszopiclone and Niaspan [niacin er]  Review of Systems   Review of Systems  Constitutional: Positive for fatigue and fever. Negative for chills.  HENT: Negative for congestion and rhinorrhea.   Respiratory: Positive for cough, sputum production, chest tightness, shortness of breath and wheezing.   Cardiovascular: Negative for chest pain and palpitations.  Gastrointestinal: Negative for diarrhea, nausea and vomiting.  Genitourinary: Negative for difficulty urinating and dysuria.  Musculoskeletal: Negative for arthralgias and back pain.  Skin:  Negative for color change and rash.  Neurological: Negative for light-headedness and headaches.    Physical Exam Updated Vital Signs BP (!) 157/70 (BP Location: Right Arm)   Pulse 95   Temp 98.7 F (37.1 C) (Oral)   Resp 20   SpO2 95%   Physical Exam Vitals and nursing note reviewed.  Constitutional:      General: He is not in acute distress.    Appearance: Normal appearance.  HENT:     Head: Normocephalic and atraumatic.     Nose: No rhinorrhea.  Eyes:     General:        Right eye: No discharge.        Left eye: No discharge.     Conjunctiva/sclera: Conjunctivae normal.  Cardiovascular:     Rate and Rhythm: Regular rhythm. Tachycardia present.  Pulmonary:     Effort: Pulmonary effort is normal.     Breath sounds: No stridor. Decreased breath sounds, wheezing and rhonchi present.  Abdominal:     General: Abdomen is flat. There is no distension.     Palpations: Abdomen is soft.  Musculoskeletal:        General: No deformity or signs of injury.  Skin:    General: Skin is warm and dry.   Neurological:     General: No focal deficit present.     Mental Status: He is alert. Mental status is at baseline.     Motor: No weakness.  Psychiatric:        Mood and Affect: Mood normal.        Behavior: Behavior normal.        Thought Content: Thought content normal.     ED Results / Procedures / Treatments   Labs (all labs ordered are listed, but only abnormal results are displayed) Labs Reviewed  CBC WITH DIFFERENTIAL/PLATELET - Abnormal; Notable for the following components:      Result Value   RBC 3.94 (*)    Hemoglobin 11.2 (*)    HCT 34.7 (*)    Monocytes Absolute 1.1 (*)    All other components within normal limits  COMPREHENSIVE METABOLIC PANEL - Abnormal; Notable for the following components:   CO2 21 (*)    Glucose, Bld 202 (*)    Creatinine, Ser 1.25 (*)    Calcium 8.8 (*)    Total Protein 6.3 (*)    Albumin 3.4 (*)    GFR calc non Af Amer 56 (*)    All other components within normal limits  URINALYSIS, ROUTINE W REFLEX MICROSCOPIC - Abnormal; Notable for the following components:   Glucose, UA >=500 (*)    Ketones, ur 5 (*)    Protein, ur 100 (*)    Bacteria, UA RARE (*)    All other components within normal limits  CBC - Abnormal; Notable for the following components:   RBC 3.42 (*)    Hemoglobin 9.9 (*)    HCT 31.2 (*)    All other components within normal limits  HEPATIC FUNCTION PANEL - Abnormal; Notable for the following components:   Total Protein 5.3 (*)    Albumin 3.0 (*)    All other components within normal limits  BASIC METABOLIC PANEL - Abnormal; Notable for the following components:   Sodium 131 (*)    CO2 19 (*)    Glucose, Bld 484 (*)    BUN 24 (*)    Creatinine, Ser 1.61 (*)    Calcium 8.2 (*)    GFR  calc non Af Amer 41 (*)    GFR calc Af Amer 48 (*)    All other components within normal limits  CBG MONITORING, ED - Abnormal; Notable for the following components:   Glucose-Capillary 180 (*)    All other components within  normal limits  CBG MONITORING, ED - Abnormal; Notable for the following components:   Glucose-Capillary 494 (*)    All other components within normal limits  TROPONIN I (HIGH SENSITIVITY) - Abnormal; Notable for the following components:   Troponin I (High Sensitivity) 26 (*)    All other components within normal limits  SARS CORONAVIRUS 2 BY RT PCR (HOSPITAL ORDER, [facility])  CULTURE, BLOOD (ROUTINE X 2)  CULTURE, BLOOD (ROUTINE X 2)  URINE CULTURE  EXPECTORATED SPUTUM ASSESSMENT W REFEX TO RESP CULTURE  RESPIRATORY PANEL BY PCR  LACTIC ACID, PLASMA  STREP PNEUMONIAE URINARY ANTIGEN  BRAIN NATRIURETIC PEPTIDE  PROCALCITONIN  CBC  LEGIONELLA PNEUMOPHILA SEROGP 1 UR AG  HEMOGLOBIN A1C  TROPONIN I (HIGH SENSITIVITY)    EKG EKG Interpretation  Date/Time:  Friday June 26 2020 22:16:00 EDT Ventricular Rate:  107 PR Interval:    QRS Duration: 78 QT Interval:  322 QTC Calculation: 430 R Axis:   51 Text Interpretation: Sinus tachycardia Atrial premature complex Low voltage, precordial leads Confirmed by Pryor Curia [phone]) on 06/27/2020 12:13:16 AM   Radiology DG Chest 2 View  Result Date: 06/26/2020 CLINICAL DATA:  Cough. EXAM: CHEST - 2 VIEW COMPARISON:  December 31, 2019 FINDINGS: There is no evidence of acute infiltrate, pleural effusion or pneumothorax. The heart size and mediastinal contours are within normal limits. There is mild calcification of the aortic arch. The visualized skeletal structures are unremarkable. IMPRESSION: No active cardiopulmonary disease. Electronically Signed   By: Virgina Norfolk M.D.   On: 06/26/2020 22:37    Procedures Procedures (including critical care time)  Medications Ordered in ED Medications  allopurinol (ZYLOPRIM) tablet 100 mg (has no administration in time range)  amLODipine (NORVASC) tablet 10 mg (has no administration in time range)  hydrALAZINE (APRESOLINE) tablet 25 mg (has no administration in  time range)  rosuvastatin (CRESTOR) tablet 20 mg (has no administration in time range)  levothyroxine (SYNTHROID) tablet 150 mcg (150 mcg Oral Given 06/27/20 0653)  tamsulosin (FLOMAX) capsule 0.4 mg (has no administration in time range)  clopidogrel (PLAVIX) tablet 75 mg (has no administration in time range)  rOPINIRole (REQUIP) tablet 0.25 mg (has no administration in time range)  multivitamin with minerals tablet 1 tablet (has no administration in time range)  fluticasone (FLONASE) 50 MCG/ACT nasal spray 2 spray (has no administration in time range)  ondansetron (ZOFRAN) tablet 4 mg (has no administration in time range)    Or  ondansetron (ZOFRAN) injection 4 mg (has no administration in time range)  cefTRIAXone (ROCEPHIN) 2 g in sodium chloride 0.9 % 100 mL IVPB (2 g Intravenous New Bag/Given 06/27/20 0507)  azithromycin (ZITHROMAX) 500 mg in sodium chloride 0.9 % 250 mL IVPB (500 mg Intravenous New Bag/Given 06/27/20 0546)  enoxaparin (LOVENOX) injection 40 mg (has no administration in time range)  albuterol (PROVENTIL) (2.5 MG/3ML) 0.083% nebulizer solution 2.5 mg (2.5 mg Nebulization Not Given 06/27/20 0827)  budesonide (PULMICORT) nebulizer solution 0.25 mg (0.25 mg Nebulization Given 06/27/20 0827)  methylPREDNISolone sodium succinate (SOLU-MEDROL) 40 mg/mL injection 40 mg (40 mg Intravenous Given 06/27/20 0505)  guaiFENesin (ROBITUSSIN) 100 MG/5ML solution 100 mg (has no administration in time range)  ipratropium-albuterol (DUONEB) 0.5-2.5 (3) MG/3ML nebulizer solution 3 mL (has no administration in time range)  insulin aspart (novoLOG) injection 0-20 Units (has no administration in time range)  insulin aspart (novoLOG) injection 0-5 Units (has no administration in time range)  insulin regular human CONCENTRATED (HUMULIN R) 500 UNIT/ML kwikpen 80 Units (has no administration in time range)  acetaminophen (TYLENOL) tablet 650 mg (650 mg Oral Given 06/26/20 2207)  albuterol (VENTOLIN HFA) 108  (90 Base) MCG/ACT inhaler 8 puff (8 puffs Inhalation Given 06/26/20 2345)  ipratropium (ATROVENT HFA) inhaler 4 puff (4 puffs Inhalation Given 06/27/20 0015)  doxycycline (VIBRAMYCIN) 100 mg in sodium chloride 0.9 % 250 mL IVPB ( Intravenous Stopped 06/27/20 0225)  lactated ringers bolus 1,000 mL ( Intravenous Stopped 06/27/20 0021)  magnesium sulfate IVPB 1 g 100 mL (0 g Intravenous Stopped 06/27/20 0020)  albuterol (VENTOLIN HFA) 108 (90 Base) MCG/ACT inhaler 8 puff (8 puffs Inhalation Given 06/27/20 0105)    ED Course  I have reviewed the triage vital signs and the nursing notes.  Pertinent labs & imaging results that were available during my care of the patient were reviewed by me and considered in my medical decision making (see chart for details).    MDM Rules/Calculators/A&P                          3 weeks of worsening symptoms consistent with COPD.  Initially febrile and tachycardic, blood cultures blood work sent, chest x-ray reviewed by myself and radiology shows no acute cardiopulmonary pathology.  He is given steroids magnesium fluids and doxycycline for COPD exacerbation.  He has almost no improvement after just inhalers.  Awaiting Covid testing.  Laboratory analysis shows hyperglycemia with no anion gap or significant acidosis, no significant leukocytosis no anemia.  Patient will likely need admission for further management of COPD exacerbation, currently awaiting Covid testing urine studies.  Pt care was handed off to on coming provider at 2330.  Complete history and physical and current plan have been communicated.  Please refer to their note for the remainder of ED care and ultimate disposition.     Final Clinical Impression(s) / ED Diagnoses Final diagnoses:  COPD exacerbation ([place])  Fever, unspecified fever cause    Rx / DC Orders ED Discharge Orders    None       Breck Coons, MD 06/27/20 1015

## 2020-06-26 NOTE — ED Triage Notes (Signed)
Pt sts congestion, shob, cough, and new fever for 3 days. Seen at [facility] and given rx tessalon. Sts was covid negative.

## 2020-06-26 NOTE — Telephone Encounter (Signed)
About a week ago pt had a virtual visit with Dr Maudie Mercury.  She gave him an antibiotic and steroids.  Patient states he is worse.  He is coughing really bad while on the phone, COPD "has gone crazy", sinuses issues in his head and gurgling in his lungs.  Patient states he really needs a call back.

## 2020-06-27 ENCOUNTER — Encounter (HOSPITAL_COMMUNITY): Payer: Self-pay | Admitting: Internal Medicine

## 2020-06-27 ENCOUNTER — Inpatient Hospital Stay (HOSPITAL_COMMUNITY): Payer: Medicare HMO

## 2020-06-27 DIAGNOSIS — D333 Benign neoplasm of cranial nerves: Secondary | ICD-10-CM | POA: Diagnosis present

## 2020-06-27 DIAGNOSIS — R509 Fever, unspecified: Secondary | ICD-10-CM | POA: Insufficient documentation

## 2020-06-27 DIAGNOSIS — J9601 Acute respiratory failure with hypoxia: Secondary | ICD-10-CM | POA: Diagnosis present

## 2020-06-27 DIAGNOSIS — J441 Chronic obstructive pulmonary disease with (acute) exacerbation: Secondary | ICD-10-CM | POA: Diagnosis present

## 2020-06-27 DIAGNOSIS — Z8673 Personal history of transient ischemic attack (TIA), and cerebral infarction without residual deficits: Secondary | ICD-10-CM | POA: Diagnosis not present

## 2020-06-27 DIAGNOSIS — K219 Gastro-esophageal reflux disease without esophagitis: Secondary | ICD-10-CM | POA: Diagnosis present

## 2020-06-27 DIAGNOSIS — E1165 Type 2 diabetes mellitus with hyperglycemia: Secondary | ICD-10-CM | POA: Diagnosis present

## 2020-06-27 DIAGNOSIS — I129 Hypertensive chronic kidney disease with stage 1 through stage 4 chronic kidney disease, or unspecified chronic kidney disease: Secondary | ICD-10-CM | POA: Diagnosis present

## 2020-06-27 DIAGNOSIS — F418 Other specified anxiety disorders: Secondary | ICD-10-CM | POA: Diagnosis present

## 2020-06-27 DIAGNOSIS — N179 Acute kidney failure, unspecified: Secondary | ICD-10-CM | POA: Diagnosis present

## 2020-06-27 DIAGNOSIS — D631 Anemia in chronic kidney disease: Secondary | ICD-10-CM | POA: Diagnosis present

## 2020-06-27 DIAGNOSIS — M109 Gout, unspecified: Secondary | ICD-10-CM | POA: Diagnosis present

## 2020-06-27 DIAGNOSIS — E1151 Type 2 diabetes mellitus with diabetic peripheral angiopathy without gangrene: Secondary | ICD-10-CM | POA: Diagnosis present

## 2020-06-27 DIAGNOSIS — Z66 Do not resuscitate: Secondary | ICD-10-CM | POA: Diagnosis not present

## 2020-06-27 DIAGNOSIS — E039 Hypothyroidism, unspecified: Secondary | ICD-10-CM | POA: Diagnosis present

## 2020-06-27 DIAGNOSIS — E1140 Type 2 diabetes mellitus with diabetic neuropathy, unspecified: Secondary | ICD-10-CM | POA: Diagnosis present

## 2020-06-27 DIAGNOSIS — J440 Chronic obstructive pulmonary disease with acute lower respiratory infection: Secondary | ICD-10-CM | POA: Diagnosis present

## 2020-06-27 DIAGNOSIS — M1990 Unspecified osteoarthritis, unspecified site: Secondary | ICD-10-CM | POA: Diagnosis present

## 2020-06-27 DIAGNOSIS — Z20822 Contact with and (suspected) exposure to covid-19: Secondary | ICD-10-CM | POA: Diagnosis present

## 2020-06-27 DIAGNOSIS — J209 Acute bronchitis, unspecified: Secondary | ICD-10-CM | POA: Diagnosis present

## 2020-06-27 DIAGNOSIS — N182 Chronic kidney disease, stage 2 (mild): Secondary | ICD-10-CM | POA: Diagnosis present

## 2020-06-27 DIAGNOSIS — E1122 Type 2 diabetes mellitus with diabetic chronic kidney disease: Secondary | ICD-10-CM | POA: Diagnosis present

## 2020-06-27 DIAGNOSIS — E669 Obesity, unspecified: Secondary | ICD-10-CM | POA: Diagnosis present

## 2020-06-27 DIAGNOSIS — E785 Hyperlipidemia, unspecified: Secondary | ICD-10-CM | POA: Diagnosis present

## 2020-06-27 DIAGNOSIS — I248 Other forms of acute ischemic heart disease: Secondary | ICD-10-CM | POA: Diagnosis not present

## 2020-06-27 LAB — BASIC METABOLIC PANEL
Anion gap: 12 (ref 5–15)
BUN: 24 mg/dL — ABNORMAL HIGH (ref 8–23)
CO2: 19 mmol/L — ABNORMAL LOW (ref 22–32)
Calcium: 8.2 mg/dL — ABNORMAL LOW (ref 8.9–10.3)
Chloride: 100 mmol/L (ref 98–111)
Creatinine, Ser: 1.61 mg/dL — ABNORMAL HIGH (ref 0.61–1.24)
GFR calc Af Amer: 48 mL/min — ABNORMAL LOW (ref >60)
GFR calc non Af Amer: 41 mL/min — ABNORMAL LOW (ref >60)
Glucose, Bld: 484 mg/dL — ABNORMAL HIGH (ref 70–99)
Potassium: 3.9 mmol/L (ref 3.5–5.1)
Sodium: 131 mmol/L — ABNORMAL LOW (ref 135–145)

## 2020-06-27 LAB — RESPIRATORY PANEL BY PCR

## 2020-06-27 LAB — URINALYSIS, ROUTINE W REFLEX MICROSCOPIC
Bilirubin Urine: NEGATIVE
Glucose, UA: >=500 mg/dL — AB
Hgb urine dipstick: NEGATIVE
Ketones, ur: 5 mg/dL — AB
Leukocytes,Ua: NEGATIVE
Nitrite: NEGATIVE
Protein, ur: 100 mg/dL — AB
Specific Gravity, Urine: 1.013 (ref 1.005–1.030)
pH: 5 (ref 5.0–8.0)

## 2020-06-27 LAB — HEPATIC FUNCTION PANEL
ALT: 26 U/L (ref 0–44)
AST: 22 U/L (ref 15–41)
Albumin: 3 g/dL — ABNORMAL LOW (ref 3.5–5.0)
Alkaline Phosphatase: 61 U/L (ref 38–126)
Bilirubin, Direct: <0.1 mg/dL (ref 0.0–0.2)
Total Bilirubin: 0.5 mg/dL (ref 0.3–1.2)
Total Protein: 5.3 g/dL — ABNORMAL LOW (ref 6.5–8.1)

## 2020-06-27 LAB — HEMOGLOBIN A1C
Hgb A1c MFr Bld: 8.2 % — ABNORMAL HIGH (ref 4.8–5.6)
Mean Plasma Glucose: 188.64 mg/dL

## 2020-06-27 LAB — SARS CORONAVIRUS 2 BY RT PCR (HOSPITAL ORDER, PERFORMED IN ~~LOC~~ HOSPITAL LAB): SARS Coronavirus 2: NEGATIVE

## 2020-06-27 LAB — CBG MONITORING, ED
Glucose-Capillary: 494 mg/dL — ABNORMAL HIGH (ref 70–99)
Glucose-Capillary: 574 mg/dL (ref 70–99)
Glucose-Capillary: 528 mg/dL (ref 70–99)
Glucose-Capillary: 493 mg/dL — ABNORMAL HIGH (ref 70–99)
Glucose-Capillary: 533 mg/dL (ref 70–99)

## 2020-06-27 LAB — CBC
HCT: 31.2 % — ABNORMAL LOW (ref 39.0–52.0)
Hemoglobin: 9.9 g/dL — ABNORMAL LOW (ref 13.0–17.0)
MCH: 28.9 pg (ref 26.0–34.0)
MCHC: 31.7 g/dL (ref 30.0–36.0)
MCV: 91.2 fL (ref 80.0–100.0)
Platelets: 161 10*3/uL (ref 150–400)
RBC: 3.42 MIL/uL — ABNORMAL LOW (ref 4.22–5.81)
RDW: 15 % (ref 11.5–15.5)
WBC: 6.4 10*3/uL (ref 4.0–10.5)
nRBC: 0 % (ref 0.0–0.2)

## 2020-06-27 LAB — PROCALCITONIN: Procalcitonin: <0.1 ng/mL

## 2020-06-27 LAB — TROPONIN I (HIGH SENSITIVITY)
Troponin I (High Sensitivity): 16 ng/L (ref <18)
Troponin I (High Sensitivity): 26 ng/L — ABNORMAL HIGH (ref <18)

## 2020-06-27 LAB — GLUCOSE, CAPILLARY: Glucose-Capillary: 465 mg/dL — ABNORMAL HIGH (ref 70–99)

## 2020-06-27 LAB — STREP PNEUMONIAE URINARY ANTIGEN: Strep Pneumo Urinary Antigen: NEGATIVE

## 2020-06-27 LAB — BRAIN NATRIURETIC PEPTIDE: B Natriuretic Peptide: 98.3 pg/mL (ref 0.0–100.0)

## 2020-06-27 MED ORDER — ALLOPURINOL 100 MG PO TABS
100.0000 mg | ORAL_TABLET | Freq: Two times a day (BID) | ORAL | Status: DC
  Administered 2020-06-27 – 2020-07-01 (×9): 100 mg via ORAL
  Filled 2020-06-27 (×10): qty 1

## 2020-06-27 MED ORDER — ADULT MULTIVITAMIN W/MINERALS CH
1.0000 | ORAL_TABLET | Freq: Every day | ORAL | Status: DC
  Administered 2020-06-27 – 2020-07-01 (×5): 1 via ORAL
  Filled 2020-06-27 (×5): qty 1

## 2020-06-27 MED ORDER — SODIUM CHLORIDE 0.9 % IV SOLN
2.0000 g | Freq: Every day | INTRAVENOUS | Status: AC
  Administered 2020-06-27 – 2020-07-01 (×5): 2 g via INTRAVENOUS
  Filled 2020-06-27: qty 2
  Filled 2020-06-27 (×3): qty 20
  Filled 2020-06-27 (×2): qty 2

## 2020-06-27 MED ORDER — GUAIFENESIN 100 MG/5ML PO SOLN
5.0000 mL | ORAL | Status: DC | PRN
  Administered 2020-06-27 – 2020-06-29 (×7): 100 mg via ORAL
  Filled 2020-06-27 (×6): qty 10

## 2020-06-27 MED ORDER — SODIUM CHLORIDE 0.9 % IV SOLN
500.0000 mg | Freq: Every day | INTRAVENOUS | Status: AC
  Administered 2020-06-27 – 2020-07-01 (×5): 500 mg via INTRAVENOUS
  Filled 2020-06-27 (×7): qty 500

## 2020-06-27 MED ORDER — BUDESONIDE 0.25 MG/2ML IN SUSP
0.2500 mg | Freq: Two times a day (BID) | RESPIRATORY_TRACT | Status: DC
  Administered 2020-06-27 – 2020-06-29 (×5): 0.25 mg via RESPIRATORY_TRACT
  Filled 2020-06-27 (×6): qty 2

## 2020-06-27 MED ORDER — LACTATED RINGERS IV SOLN: INTRAVENOUS | Status: AC

## 2020-06-27 MED ORDER — IPRATROPIUM-ALBUTEROL 0.5-2.5 (3) MG/3ML IN SOLN
3.0000 mL | Freq: Four times a day (QID) | RESPIRATORY_TRACT | Status: DC
  Administered 2020-06-27 – 2020-06-29 (×9): 3 mL via RESPIRATORY_TRACT
  Filled 2020-06-27 (×9): qty 3

## 2020-06-27 MED ORDER — METHYLPREDNISOLONE SODIUM SUCC 40 MG IJ SOLR
40.0000 mg | Freq: Every day | INTRAMUSCULAR | Status: DC
  Administered 2020-06-28 – 2020-06-30 (×3): 40 mg via INTRAVENOUS
  Filled 2020-06-27 (×3): qty 1

## 2020-06-27 MED ORDER — LEVOTHYROXINE SODIUM 50 MCG PO TABS
150.0000 ug | ORAL_TABLET | Freq: Every day | ORAL | Status: DC
  Administered 2020-06-27 – 2020-07-01 (×5): 150 ug via ORAL
  Filled 2020-06-27 (×5): qty 1

## 2020-06-27 MED ORDER — INSULIN ASPART 100 UNIT/ML ~~LOC~~ SOLN
0.0000 [IU] | Freq: Three times a day (TID) | SUBCUTANEOUS | Status: DC
  Administered 2020-06-27: 20 [IU] via SUBCUTANEOUS
  Filled 2020-06-27: qty 0.2

## 2020-06-27 MED ORDER — BENZONATATE 100 MG PO CAPS: 100.0000 mg | ORAL_CAPSULE | Freq: Two times a day (BID) | ORAL | Status: DC | PRN

## 2020-06-27 MED ORDER — ALBUTEROL SULFATE HFA 108 (90 BASE) MCG/ACT IN AERS
8.0000 | INHALATION_SPRAY | Freq: Once | RESPIRATORY_TRACT | Status: AC
  Administered 2020-06-27: 8 via RESPIRATORY_TRACT

## 2020-06-27 MED ORDER — ROSUVASTATIN CALCIUM 20 MG PO TABS
20.0000 mg | ORAL_TABLET | Freq: Every day | ORAL | Status: DC
  Administered 2020-06-27 – 2020-07-01 (×5): 20 mg via ORAL
  Filled 2020-06-27 (×5): qty 1

## 2020-06-27 MED ORDER — INSULIN ASPART 100 UNIT/ML ~~LOC~~ SOLN
0.0000 [IU] | Freq: Three times a day (TID) | SUBCUTANEOUS | Status: DC
  Administered 2020-06-27: 9 [IU] via SUBCUTANEOUS
  Filled 2020-06-27: qty 0.09

## 2020-06-27 MED ORDER — ALBUTEROL SULFATE (2.5 MG/3ML) 0.083% IN NEBU
2.5000 mg | INHALATION_SOLUTION | RESPIRATORY_TRACT | Status: DC
Start: 2020-06-27 — End: 2020-06-27

## 2020-06-27 MED ORDER — IPRATROPIUM BROMIDE 0.02 % IN SOLN: 0.5000 mg | Freq: Once | RESPIRATORY_TRACT | Status: DC

## 2020-06-27 MED ORDER — LISINOPRIL 20 MG PO TABS: 40.0000 mg | ORAL_TABLET | Freq: Every day | ORAL | Status: DC

## 2020-06-27 MED ORDER — IPRATROPIUM-ALBUTEROL 0.5-2.5 (3) MG/3ML IN SOLN
3.0000 mL | RESPIRATORY_TRACT | Status: DC
  Administered 2020-06-27: 3 mL via RESPIRATORY_TRACT
  Filled 2020-06-27 (×2): qty 3

## 2020-06-27 MED ORDER — INSULIN REGULAR HUMAN (CONC) 500 UNIT/ML ~~LOC~~ SOLN
80.0000 [IU] | Freq: Three times a day (TID) | SUBCUTANEOUS | Status: DC
  Filled 2020-06-27: qty 20

## 2020-06-27 MED ORDER — INSULIN REGULAR HUMAN (CONC) 500 UNIT/ML ~~LOC~~ SOPN
120.0000 [IU] | PEN_INJECTOR | Freq: Three times a day (TID) | SUBCUTANEOUS | Status: DC
  Administered 2020-06-27: 120 [IU] via SUBCUTANEOUS
  Filled 2020-06-27: qty 3

## 2020-06-27 MED ORDER — INSULIN REGULAR HUMAN (CONC) 500 UNIT/ML ~~LOC~~ SOPN
80.0000 [IU] | PEN_INJECTOR | Freq: Three times a day (TID) | SUBCUTANEOUS | Status: DC
  Administered 2020-06-27: 80 [IU] via SUBCUTANEOUS
  Filled 2020-06-27: qty 3

## 2020-06-27 MED ORDER — TAMSULOSIN HCL 0.4 MG PO CAPS
0.4000 mg | ORAL_CAPSULE | Freq: Every day | ORAL | Status: DC
  Administered 2020-06-27 – 2020-06-30 (×4): 0.4 mg via ORAL
  Filled 2020-06-27 (×4): qty 1

## 2020-06-27 MED ORDER — AMLODIPINE BESYLATE 10 MG PO TABS
10.0000 mg | ORAL_TABLET | Freq: Every day | ORAL | Status: DC
  Administered 2020-06-27 – 2020-07-01 (×5): 10 mg via ORAL
  Filled 2020-06-27: qty 2
  Filled 2020-06-27 (×4): qty 1

## 2020-06-27 MED ORDER — IPRATROPIUM BROMIDE 0.02 % IN SOLN: 0.5000 mg | RESPIRATORY_TRACT | Status: DC

## 2020-06-27 MED ORDER — ALBUTEROL SULFATE (2.5 MG/3ML) 0.083% IN NEBU
2.5000 mg | INHALATION_SOLUTION | RESPIRATORY_TRACT | Status: DC | PRN
  Administered 2020-06-28 – 2020-06-29 (×4): 2.5 mg via RESPIRATORY_TRACT
  Filled 2020-06-27 (×6): qty 3

## 2020-06-27 MED ORDER — FLUTICASONE PROPIONATE 50 MCG/ACT NA SUSP
2.0000 | Freq: Every day | NASAL | Status: DC
  Administered 2020-06-28 – 2020-07-01 (×4): 2 via NASAL
  Filled 2020-06-27 (×2): qty 16

## 2020-06-27 MED ORDER — ONDANSETRON HCL 4 MG PO TABS: 4.0000 mg | ORAL_TABLET | Freq: Four times a day (QID) | ORAL | Status: DC | PRN

## 2020-06-27 MED ORDER — INSULIN ASPART 100 UNIT/ML ~~LOC~~ SOLN
0.0000 [IU] | Freq: Every day | SUBCUTANEOUS | Status: DC
  Filled 2020-06-27: qty 0.05

## 2020-06-27 MED ORDER — FUROSEMIDE 40 MG PO TABS
20.0000 mg | ORAL_TABLET | ORAL | Status: DC
  Filled 2020-06-27: qty 1

## 2020-06-27 MED ORDER — ENOXAPARIN SODIUM 40 MG/0.4ML ~~LOC~~ SOLN
40.0000 mg | SUBCUTANEOUS | Status: DC
  Administered 2020-06-27 – 2020-07-01 (×5): 40 mg via SUBCUTANEOUS
  Filled 2020-06-27 (×5): qty 0.4

## 2020-06-27 MED ORDER — CLOPIDOGREL BISULFATE 75 MG PO TABS
75.0000 mg | ORAL_TABLET | Freq: Every day | ORAL | Status: DC
  Administered 2020-06-27 – 2020-07-01 (×5): 75 mg via ORAL
  Filled 2020-06-27 (×5): qty 1

## 2020-06-27 MED ORDER — ROPINIROLE HCL 0.25 MG PO TABS
0.2500 mg | ORAL_TABLET | Freq: Every day | ORAL | Status: DC
  Administered 2020-06-27 – 2020-06-30 (×4): 0.25 mg via ORAL
  Filled 2020-06-27 (×4): qty 1

## 2020-06-27 MED ORDER — ALBUTEROL SULFATE (2.5 MG/3ML) 0.083% IN NEBU: 5.0000 mg | INHALATION_SOLUTION | Freq: Once | RESPIRATORY_TRACT | Status: DC

## 2020-06-27 MED ORDER — HYDRALAZINE HCL 25 MG PO TABS
25.0000 mg | ORAL_TABLET | Freq: Three times a day (TID) | ORAL | Status: DC
  Administered 2020-06-27 – 2020-07-01 (×13): 25 mg via ORAL
  Filled 2020-06-27 (×13): qty 1

## 2020-06-27 MED ORDER — ONDANSETRON HCL 4 MG/2ML IJ SOLN: 4.0000 mg | Freq: Four times a day (QID) | INTRAMUSCULAR | Status: DC | PRN

## 2020-06-27 MED ORDER — INSULIN ASPART 100 UNIT/ML ~~LOC~~ SOLN
0.0000 [IU] | SUBCUTANEOUS | Status: DC
  Administered 2020-06-27 (×3): 20 [IU] via SUBCUTANEOUS
  Administered 2020-06-28: 3 [IU] via SUBCUTANEOUS
  Administered 2020-06-28: 15 [IU] via SUBCUTANEOUS
  Administered 2020-06-28: 4 [IU] via SUBCUTANEOUS
  Administered 2020-06-28: 20 [IU] via SUBCUTANEOUS
  Administered 2020-06-29: 15 [IU] via SUBCUTANEOUS
  Filled 2020-06-27: qty 0.2

## 2020-06-27 MED ORDER — METHYLPREDNISOLONE SODIUM SUCC 40 MG IJ SOLR
40.0000 mg | Freq: Two times a day (BID) | INTRAMUSCULAR | Status: DC
  Administered 2020-06-27: 40 mg via INTRAVENOUS
  Filled 2020-06-27: qty 1

## 2020-06-27 MED ORDER — IPRATROPIUM-ALBUTEROL 0.5-2.5 (3) MG/3ML IN SOLN: 3.0000 mL | Freq: Three times a day (TID) | RESPIRATORY_TRACT | Status: DC

## 2020-06-27 NOTE — ED Notes (Addendum)
cbg 494, md notified. Instructed to give additional 11u novolog this morning to make a total of 20u novolog

## 2020-06-27 NOTE — ED Provider Notes (Signed)
12:00 AM  Assumed care.  Patient is a 74 year old male with history of COPD who presents to the emergency department with shortness of breath for 3 weeks.  Patient having a COPD exacerbation here.  Has received duo nebs, magnesium, doxycycline, Solu-Medrol.  Found to be febrile.  He was not aware that he was having fevers.  Has had both COVID-19 vaccinations.  Denies any other symptoms of infection.  Covid test pending.  Labs reassuring.  Chest x-ray clear.  Patient will likely need admission.  1:35 AM  Pt's breath sounds improving per nursing staff but he is still rhonchorous, wheezing with diminished aeration.  Not in distress and not hypoxic.  On room air currently.  He agrees with plan for admission for continued treatment for COPD exacerbation.  Will give neb now that Covid test is negative.   2:03 AM Discussed patient's case with hospitalist, Dr. Nanda Quinton.  I have recommended admission and patient (and family if present) agree with this plan. Admitting physician will place admission orders.   I reviewed all nursing notes, vitals, pertinent previous records and reviewed/interpreted all EKGs, lab and urine results, imaging (as available).     Rogan Wigley, Delice Bison, DO 06/27/20 [phone]

## 2020-06-27 NOTE — H&P (Signed)
History and Physical    Sean Saunders JOI:[id number] DOB: 02-Nov-1946 DOA: 06/26/2020  PCP: Martinique, Betty G, MD  Patient coming from: Home.  Chief Complaint: Shortness of breath.  HPI: Sean Saunders is a 74 y.o. male with history of COPD, peripheral vascular disease, diabetes mellitus type 2, hypothyroidism gout and chronic kidney disease has been experiencing shortness of breath with wheezing for the last 2 weeks had gone to his primary care physician about 2 weeks ago when he was prescribed steroids and cough suppressants.  Despite which his symptoms did not improve and he went to [place] where patient had CT of the sinuses and per the patient did not show anything acute and was prescribed for the cough suppressants.  Patient shortness of breath continued to worsen.  Has been having some productive cough wheezing.  Denies any chest pain nausea vomiting abdominal pain or diarrhea.  Had had Covid vaccination.  ED Course: In the ER patient is febrile with temperature of 101.2 F and on exam patient is wheezing bilaterally with chest x-ray showing nothing acute.  Labs showing creatinine 1.2 hemoglobin 9.2 blood glucose of 202.  EKG shows sinus tachycardia.  Patient admitted for acute respiratory failure hypoxia secondary to COPD exacerbation with fever.  UA is pending.  Covid test was negative.  Review of Systems: As per HPI, rest all negative.   Past Medical History:  Diagnosis Date  . Arthritis   . Bronchitis    hx of  . COPD (chronic obstructive pulmonary disease) ([place])   . Diabetes mellitus   . DVT (deep venous thrombosis) ([place])   . GERD (gastroesophageal reflux disease)   . Hyperlipidemia   . Hypertension   . Hypothyroidism   . Onychomycosis 06/07/2013  . Stroke [facility])    TIA's 01/05/11-01/08/11-01/26/11  . Thyroid disease   . TIA (transient ischemic attack) 08/03/2011   Left sided weakness. 3 episodes Feb-March  . TIA (transient ischemic attack)    x 3    Past Surgical History:   Procedure Laterality Date  . ABDOMINAL AORTAGRAM N/A 06/04/2012   Procedure: ABDOMINAL Maxcine Ham;  Surgeon: Angelia Mould, MD;  Location: [facility] CATH LAB;  Service: Cardiovascular;  Laterality: N/A;  . APPENDECTOMY    . COLONOSCOPY    . FEMORAL-POPLITEAL BYPASS GRAFT  07/05/2012   Procedure: BYPASS GRAFT FEMORAL-POPLITEAL ARTERY;  Surgeon: Angelia Mould, MD;  Location: [facility] OR;  Service: Vascular;  Laterality: Left;  Revision Left fem/pop w/ artery patch angioplasty utilizing left GSV graft and extenion to tib/per trunk w/right GSV and interop arteriogram  . JOINT REPLACEMENT  2011   -Hip  . NASAL SINUS SURGERY    . PR VEIN BYPASS GRAFT,AORTO-FEM-POP    . PR VEIN BYPASS GRAFT,AORTO-FEM-POP  08-01-11   (L) Fem-pop BP  . toe nail    . TONSILLECTOMY       reports that he quit smoking about 2 years ago. His smoking use included cigarettes. He has a 25.00 pack-year smoking history. He has never used smokeless tobacco. He reports current alcohol use. He reports that he does not use drugs.  Allergies  Allergen Reactions  . Eszopiclone Itching  . Niaspan [Niacin Er] Nausea And Vomiting and Rash    Family History  Problem Relation Age of Onset  . Stroke Mother   . Diabetes Mother   . Other Father        alzheimers  . Diabetes Father   . Stroke Father   . Diabetes Sister  Prior to Admission medications   Medication Sig Start Date End Date Taking? Authorizing Provider  allopurinol (ZYLOPRIM) 100 MG tablet Take 100 mg by mouth 2 (two) times daily.    [provider]  amLODipine (NORVASC) 10 MG tablet Take 10 mg by mouth daily.    [provider]  benzonatate (TESSALON PERLES) 100 MG capsule Take 1 capsule (100 mg total) by mouth 2 (two) times daily as needed. 06/16/20   Lucretia Kern, DO  clopidogrel (PLAVIX) 75 MG tablet Take 75 mg by mouth daily.      [provider]  fluticasone (FLONASE) 50 MCG/ACT nasal spray Place 2 sprays into both nostrils  daily. 08/07/19   Martinique, Betty G, MD  furosemide (LASIX) 20 MG tablet Take 20 mg by mouth every other day.     [provider]  hydrALAZINE (APRESOLINE) 25 MG tablet Take 1 tablet (25 mg total) by mouth 2 (two) times daily. Patient taking differently: Take 25 mg by mouth 3 (three) times daily.  01/10/20   Martinique, Betty G, MD  insulin regular human CONCENTRATED (HUMULIN R) 500 UNIT/ML SOLN injection Inject 40-80 Units into the skin See admin instructions. 70units in am 40units at lunch 80units at dinner    [provider]  levothyroxine (SYNTHROID, LEVOTHROID) 150 MCG tablet Take 150 mcg by mouth daily before breakfast.    [provider]  lisinopril (ZESTRIL) 40 MG tablet Take 40 mg by mouth daily.    [provider]  metFORMIN (GLUCOPHAGE) 500 MG tablet Take 500 mg by mouth 2 (two) times daily with a meal.    [provider]  Multiple Vitamin (MULTIVITAMIN WITH MINERALS) TABS tablet Take 1 tablet by mouth daily.    [provider]  predniSONE (DELTASONE) 20 MG tablet Take 2 tablets (40 mg total) by mouth daily with breakfast. 06/16/20   Lucretia Kern, DO  rOPINIRole (REQUIP) 0.25 MG tablet Take 0.25 mg by mouth at bedtime.    [provider]  rosuvastatin (CRESTOR) 20 MG tablet Take 20 mg by mouth daily.    [provider]  tamsulosin (FLOMAX) 0.4 MG CAPS capsule Take 0.4 mg by mouth.    [provider]    Physical Exam: Constitutional: Moderately built and nourished. Vitals:   06/27/20 0015 06/27/20 0045 06/27/20 0105 06/27/20 0314  BP:  (!) 164/69  (!) 144/65  Pulse:  (!) 102  98  Resp:  (!) 22  16  Temp:      TempSrc:      SpO2: 94% 95% 95% 96%   Eyes: Anicteric no pallor. ENMT: No discharge from the ears eyes nose or mouth. Neck: No mass felt.  No neck rigidity.  No JVD appreciated. Respiratory: Bilateral expiratory wheeze and no crepitations. Cardiovascular: S1-S2 heard. Abdomen: Soft nontender bowel  sounds present. Musculoskeletal: No edema. Skin: No rash. Neurologic: Alert awake oriented to time place and person.  Moves all extremities. Psychiatric: Appears normal per normal affect.   Labs on Admission: I have personally reviewed following labs and imaging studies  CBC: Recent Labs  Lab 06/26/20 2158  WBC 9.4  NEUTROABS 6.5  HGB 11.2*  HCT 34.7*  MCV 88.1  PLT 010   Basic Metabolic Panel: Recent Labs  Lab 06/26/20 2158  NA 137  K 4.3  CL 105  CO2 21*  GLUCOSE 202*  BUN 21  CREATININE 1.25*  CALCIUM 8.8*   GFR: CrCl cannot be calculated (Unknown ideal weight.). Liver Function Tests: Recent  Labs  Lab 06/26/20 2158  AST 23  ALT 28  ALKPHOS 70  BILITOT 0.7  PROT 6.3*  ALBUMIN 3.4*   No results for input(s): LIPASE, AMYLASE in the last 168 hours. No results for input(s): AMMONIA in the last 168 hours. Coagulation Profile: No results for input(s): INR, PROTIME in the last 168 hours. Cardiac Enzymes: No results for input(s): CKTOTAL, CKMB, CKMBINDEX, TROPONINI in the last 168 hours. BNP (last 3 results) No results for input(s): PROBNP in the last 8760 hours. HbA1C: No results for input(s): HGBA1C in the last 72 hours. CBG: Recent Labs  Lab 06/26/20 2153  GLUCAP 180*   Lipid Profile: No results for input(s): CHOL, HDL, LDLCALC, TRIG, CHOLHDL, LDLDIRECT in the last 72 hours. Thyroid Function Tests: No results for input(s): TSH, T4TOTAL, FREET4, T3FREE, THYROIDAB in the last 72 hours. Anemia Panel: No results for input(s): VITAMINB12, FOLATE, FERRITIN, TIBC, IRON, RETICCTPCT in the last 72 hours. Urine analysis:    Component Value Date/Time   COLORURINE YELLOW 12/31/2019 1711   APPEARANCEUR CLEAR 12/31/2019 1711   LABSPEC 1.035 (H) 12/31/2019 1711   PHURINE 5.0 12/31/2019 1711   GLUCOSEU NEGATIVE 12/31/2019 1711   HGBUR NEGATIVE 12/31/2019 1711   BILIRUBINUR NEGATIVE 12/31/2019 1711   KETONESUR NEGATIVE 12/31/2019 1711   PROTEINUR NEGATIVE  12/31/2019 1711   UROBILINOGEN 0.2 07/03/2012 0937   NITRITE NEGATIVE 12/31/2019 1711   LEUKOCYTESUR NEGATIVE 12/31/2019 1711   Sepsis Labs: @LABRCNTIP (procalcitonin:4,lacticidven:4) ) Recent Results (from the past 240 hour(s))  SARS Coronavirus 2 by RT PCR (hospital order, performed in [place] hospital lab) Nasopharyngeal Nasopharyngeal Swab     Status: None   Collection Time: 06/26/20 11:33 PM   Specimen: Nasopharyngeal Swab  Result Value Ref Range Status   SARS Coronavirus 2 NEGATIVE NEGATIVE Final    Comment: (NOTE) SARS-CoV-2 target nucleic acids are NOT DETECTED.  The SARS-CoV-2 RNA is generally detectable in upper and lower respiratory specimens during the acute phase of infection. The lowest concentration of SARS-CoV-2 viral copies this assay can detect is 250 copies / mL. A negative result does not preclude SARS-CoV-2 infection and should not be used as the sole basis for treatment or other patient management decisions.  A negative result may occur with improper specimen collection / handling, submission of specimen other than nasopharyngeal swab, presence of viral mutation(s) within the areas targeted by this assay, and inadequate number of viral copies (<250 copies / mL). A negative result must be combined with clinical observations, patient history, and epidemiological information.  Fact Sheet for Patients:   StrictlyIdeas.no  Fact Sheet for Healthcare Providers: BankingDealers.co.za  This test is not yet approved or  cleared by the [place] FDA and has been authorized for detection and/or diagnosis of SARS-CoV-2 by FDA under an Emergency Use Authorization (EUA).  This EUA will remain in effect (meaning this test can be used) for the duration of the COVID-19 declaration under Section 564(b)(1) of the Act, 21 U.S.C. section 360bbb-3(b)(1), unless the authorization is terminated or revoked sooner.  Performed  at [facility], [place] [address] [place], [place] [address]      Radiological Exams on Admission: DG Chest 2 View  Result Date: 06/26/2020 CLINICAL DATA:  Cough. EXAM: CHEST - 2 VIEW COMPARISON:  December 31, 2019 FINDINGS: There is no evidence of acute infiltrate, pleural effusion or pneumothorax. The heart size and mediastinal contours are within normal limits. There is mild calcification of the aortic arch. The visualized skeletal structures are unremarkable.  IMPRESSION: No active cardiopulmonary disease. Electronically Signed   By: Virgina Norfolk M.D.   On: 06/26/2020 22:37    EKG: Independently reviewed.  Sinus tachycardia.  Assessment/Plan Principal Problem:   Acute respiratory failure with hypoxia (HCC) Active Problems:   Peripheral vascular disease, unspecified ([place])   Type 2 diabetes mellitus with diabetic neuropathy, unspecified (HCC)   CKD (chronic kidney disease), stage III   Vestibular schwannoma (HCC)   COPD with acute exacerbation ([place])    1. Acute respiratory failure with hypoxia secondary to COPD exacerbation for which I have placed patient on IV steroids Pulmicort nebulizer and antibiotics. 2. Fever likely from bronchitis.  Check respiratory viral panel.  Since patient has productive cough we have placed patient on empiric antibiotics.  Follow blood cultures.  UA is pending. 3. Diabetes mellitus type 2 takes you 500 insulin 3 times daily around 80 units.  I have also placed patient on sliding scale coverage. 4. History of peripheral vascular disease status post femoropopliteal bypass on Plavix and statin. 5. Chronic kidney disease stage III creatinine appears to be at baseline. 6. Chronic anemia likely from renal disease hemoglobin at baseline. 7. Hypothyroidism on Synthroid. 8. History of gout on allopurinol. 9. History of schwannoma.  Since patient is acute respiratory failure with hypoxia will need more than 2 midnight stay in inpatient  status.   DVT prophylaxis: Lovenox. Code Status: Full code. Family Communication: Discussed with patient. Disposition Plan: Home. Consults called: None. Admission status: Inpatient.   Rise Patience MD [facility] Pager [phone].  If 7PM-7AM, please contact night-coverage [email or web address] Password [facility]  06/27/2020, 4:01 AM

## 2020-06-27 NOTE — ED Notes (Signed)
Attempted to call report on patient @ 1650 and 1708 - was directed both times to call back.

## 2020-06-27 NOTE — Progress Notes (Addendum)
PROGRESS NOTE    Sean Saunders  WLN:[id number] DOB: Dec 19, 1945 DOA: 06/26/2020 PCP: [place], Sean Saunders   Chief Complaint  Patient presents with  . Shortness of Breath   Brief Narrative:  Sean Saunders is Sean Saunders 74 y.o. male with history of COPD, peripheral vascular disease, diabetes mellitus type 2, hypothyroidism gout and chronic kidney disease has been experiencing shortness of breath with wheezing for the last 2 weeks had gone to his primary care physician about 2 weeks ago when he was prescribed steroids and cough suppressants.  Despite which his symptoms did not improve and he went to [place] where patient had CT of the sinuses and per the patient did not show anything acute and was prescribed for the cough suppressants.  Patient shortness of breath continued to worsen.  Has been having some productive cough wheezing.  Denies any chest pain nausea vomiting abdominal pain or diarrhea.  Had had Covid vaccination.  ED Course: In the ER patient is febrile with temperature of 101.2 F and on exam patient is wheezing bilaterally with chest x-ray showing nothing acute.  Labs showing creatinine 1.2 hemoglobin 9.2 blood glucose of 202.  EKG shows sinus tachycardia.  Patient admitted for acute respiratory failure hypoxia secondary to COPD exacerbation with fever.  UA is pending.  Covid test was negative.  Assessment & Plan:   Principal Problem:   Acute respiratory failure with hypoxia (HCC) Active Problems:   Peripheral vascular disease, unspecified (HCC)   Type 2 diabetes mellitus with diabetic neuropathy, unspecified (HCC)   CKD (chronic kidney disease), stage III   Vestibular schwannoma (HCC)   COPD with acute exacerbation (HCC)   COPD exacerbation ([place])  1. Acute respiratory failure with hypoxia secondary to COPD 1. CT chest without acute abnormality 2. Consistent with COPD exacerbation 3. Continue steroids, antibiotics, scheduled and prn nebs 4. Negative urine strep, pending  legionella 5. Pending RVP, negative covid 6. IS, flutter, OOB, resp therapy and pt  2. Fever from bronchitis 1. RVP pending 2. Urine strep negative, pending legionella, negative covid 3. Follow blood and urine cultures  3. Diabetes mellitus type 2, uncontrolled with hyperglycemia 1. Follow a1c 2. He's on u500 insulin at home, but takes this 4 times daily with sliding scale that he manages (says he often takes 140-180 during the day with meals and is more careful with dosing at night) 3. BG's uncontrolled here - continue u500 insulin and q4 SSI - will be difficult with steroids  4. History of peripheral vascular disease status post femoropopliteal bypass on Plavix and statin.  5. AKI on Chronic kidney disease stage III  1. Creatinine bumped today, hold lasix and lisinopril - suspect prerenal related to glucosuria with dehydration, continue IVF, consider renal US if not improving 2. 1.25 at presentation, 1.61 today, will follow  6. Chronic anemia, normocytic 1. Lower today, possibly dilutional 2. Follow iron, b12, folate, ferritin  7. Hypothyroidism on Synthroid.  8. History of gout on allopurinol.  9. Hypertension 1. Continue hydralazine, amlodipine 2. Holding lasix, lisinopril   10. Elevated troponin  1. Suspect demand, follow serial troponins, pt without CP, ekg without concerning changes  11. History of schwannoma.  DVT prophylaxis: lovenox Code Status: DNR - pt discussed this with me at bedside today with son in law present Family Communication: son in law at bedside Disposition:   Status is: Inpatient  Remains inpatient appropriate because:Inpatient level of care appropriate due to severity of illness   Dispo: The patient is  from: Home              Anticipated d/c is to: Home              Anticipated d/c date is: > 3 days              Patient currently is not medically stable to d/c. Consultants:   none  Procedures:   none  Antimicrobials:   Anti-infectives (From admission, onward)   Start     Dose/Rate Route Frequency Ordered Stop   06/27/20 0430  cefTRIAXone (ROCEPHIN) 2 g in sodium chloride 0.9 % 100 mL IVPB     Discontinue     2 g 200 mL/hr over 30 Minutes Intravenous Daily 06/27/20 0401 07/02/20 0959   06/27/20 0430  azithromycin (ZITHROMAX) 500 mg in sodium chloride 0.9 % 250 mL IVPB     Discontinue     500 mg 250 mL/hr over 60 Minutes Intravenous Daily 06/27/20 0401 07/02/20 0959   06/26/20 2245  doxycycline (VIBRAMYCIN) 100 mg in sodium chloride 0.9 % 250 mL IVPB        100 mg 125 mL/hr over 120 Minutes Intravenous  Once 06/26/20 2244 06/27/20 0225     Subjective: C/o continued shorntess of breath  Objective: Vitals:   06/27/20 1418 06/27/20 1521 06/27/20 1615 06/27/20 1619  BP: (!) 170/83 (!) 164/80 (!) 151/67 (!) 151/67  Pulse: (!) 101 97 96 93  Resp: 15 15  18   Temp:      TempSrc:      SpO2: 95% 94% 95% 97%    Intake/Output Summary (Last 24 hours) at 06/27/2020 1649 Last data filed at 06/27/2020 1400 Gross per 24 hour  Intake 1250.83 ml  Output 850 ml  Net 400.83 ml   There were no vitals filed for this visit.  Examination:  General exam: Appears calm and comfortable  Respiratory system: diffuse wheezing, coarse rhonchi Cardiovascular system: S1 & S2 heard, RRR. Gastrointestinal system: Abdomen is nondistended, soft and nontender.  Central nervous system: Alert and oriented. No focal neurological deficits. Extremities: no LEE Skin: No rashes, lesions or ulcers Psychiatry: Judgement and insight appear normal. Mood & affect appropriate.     Data Reviewed: I have personally reviewed following labs and imaging studies  CBC: Recent Labs  Lab 06/26/20 2158 06/27/20 0509  WBC 9.4 6.4  NEUTROABS 6.5  --   HGB 11.2* 9.9*  HCT 34.7* 31.2*  MCV 88.1 91.2  PLT 191 564    Basic Metabolic Panel: Recent Labs  Lab 06/26/20 2158 06/27/20 0509  NA 137 131*  K 4.3 3.9  CL 105 100  CO2 21*  19*  GLUCOSE 202* 484*  BUN 21 24*  CREATININE 1.25* 1.61*  CALCIUM 8.8* 8.2*    GFR: CrCl cannot be calculated (Unknown ideal weight.).  Liver Function Tests: Recent Labs  Lab 06/26/20 2158 06/27/20 0509  AST 23 22  ALT 28 26  ALKPHOS 70 61  BILITOT 0.7 0.5  PROT 6.3* 5.3*  ALBUMIN 3.4* 3.0*    CBG: Recent Labs  Lab 06/26/20 2153 06/27/20 0755 06/27/20 1115 06/27/20 1357 06/27/20 1548  GLUCAP 180* 494* 574* 528* 493*     Recent Results (from the past 240 hour(s))  SARS Coronavirus 2 by RT PCR (hospital order, performed in [facility] hospital lab) Nasopharyngeal Nasopharyngeal Swab     Status: None   Collection Time: 06/26/20 11:33 PM   Specimen: Nasopharyngeal Swab  Result Value Ref Range Status   SARS  Coronavirus 2 NEGATIVE NEGATIVE Final    Comment: (NOTE) SARS-CoV-2 target nucleic acids are NOT DETECTED.  The SARS-CoV-2 RNA is generally detectable in upper and lower respiratory specimens during the acute phase of infection. The lowest concentration of SARS-CoV-2 viral copies this assay can detect is 250 copies / mL. Dilon Lank negative result does not preclude SARS-CoV-2 infection and should not be used as the sole basis for treatment or other patient management decisions.  Madolyn Ackroyd negative result may occur with improper specimen collection / handling, submission of specimen other than nasopharyngeal swab, presence of viral mutation(s) within the areas targeted by this assay, and inadequate number of viral copies (<250 copies / mL). Rockell Faulks negative result must be combined with clinical observations, patient history, and epidemiological information.  Fact Sheet for Patients:   StrictlyIdeas.no  Fact Sheet for Healthcare Providers: BankingDealers.co.za  This test is not yet approved or  cleared by the [place] FDA and has been authorized for detection and/or diagnosis of SARS-CoV-2 by FDA under an Emergency Use  Authorization (EUA).  This EUA will remain in effect (meaning this test can be used) for the duration of the COVID-19 declaration under Section 564(b)(1) of the Act, 21 U.S.C. section 360bbb-3(b)(1), unless the authorization is terminated or revoked sooner.  Performed at [facility], [place] [address] [place], [place] [address]          Radiology Studies: DG Chest 2 View  Result Date: 06/26/2020 CLINICAL DATA:  Cough. EXAM: CHEST - 2 VIEW COMPARISON:  December 31, 2019 FINDINGS: There is no evidence of acute infiltrate, pleural effusion or pneumothorax. The heart size and mediastinal contours are within normal limits. There is mild calcification of the aortic arch. The visualized skeletal structures are unremarkable. IMPRESSION: No active cardiopulmonary disease. Electronically Signed   By: Virgina Norfolk M.D.   On: 06/26/2020 22:37   CT CHEST WO CONTRAST  Result Date: 06/27/2020 CLINICAL DATA:  Cough and shortness of breath for the past 3 weeks. EXAM: CT CHEST WITHOUT CONTRAST TECHNIQUE: Multidetector CT imaging of the chest was performed following the standard protocol without IV contrast. COMPARISON:  Chest radiographs obtained yesterday. FINDINGS: Cardiovascular: Atheromatous calcifications, including the coronary arteries and aorta. Minimal pericardial effusion with Lasharon Dunivan maximum thickness of 8 mm. Mediastinum/Nodes: No enlarged mediastinal or axillary lymph nodes. Thyroid gland, trachea, and esophagus demonstrate no significant findings. Lungs/Pleura: Mild bilateral bullous changes in Raju Coppolino centrilobular distribution. No airspace consolidation or pleural fluid. Upper Abdomen: Multiple small gallstones in the gallbladder measuring up to 7 mm in maximum diameter each. No gallbladder wall thickening or pericholecystic fluid. Musculoskeletal: Thoracic and lower cervical spine degenerative changes. IMPRESSION: 1. No acute abnormality. 2. Mild changes of COPD. 3. Minimal pericardial  effusion. 4. Cholelithiasis. 5.  Calcific coronary artery and aortic atherosclerosis. Aortic Atherosclerosis (ICD10-I70.0) and Emphysema (ICD10-J43.9). Electronically Signed   By: Claudie Revering M.D.   On: 06/27/2020 14:26        Scheduled Meds: . allopurinol  100 mg Oral BID  . amLODipine  10 mg Oral Daily  . budesonide (PULMICORT) nebulizer solution  0.25 mg Nebulization BID  . clopidogrel  75 mg Oral Daily  . enoxaparin (LOVENOX) injection  40 mg Subcutaneous Q24H  . fluticasone  2 spray Each Nare Daily  . hydrALAZINE  25 mg Oral TID  . insulin aspart  0-20 Units Subcutaneous Q4H  . insulin regular human CONCENTRATED  120 Units Subcutaneous TID WC  . ipratropium-albuterol  3 mL Nebulization Q6H  . levothyroxine  150 mcg Oral Q0600  . [START ON 06/28/2020] methylPREDNISolone (SOLU-MEDROL) injection  40 mg Intravenous Daily  . multivitamin with minerals  1 tablet Oral Daily  . rOPINIRole  0.25 mg Oral QHS  . rosuvastatin  20 mg Oral Daily  . tamsulosin  0.4 mg Oral QPC supper   Continuous Infusions: . azithromycin Stopped (06/27/20 1028)  . cefTRIAXone (ROCEPHIN)  IV Stopped (06/27/20 1028)     LOS: 0 days    Time spent: over 30 min    Fayrene Helper, [facility]   To contact the attending provider between 7A-7P or the covering provider during after hours 7P-7A, please log into the web site [email or web address] and access using universal [place] password for that web site. If you do not have the password, please call the hospital operator.  06/27/2020, 4:49 PM

## 2020-06-28 ENCOUNTER — Inpatient Hospital Stay (HOSPITAL_COMMUNITY): Payer: Medicare HMO

## 2020-06-28 LAB — CBC WITH DIFFERENTIAL/PLATELET
Abs Immature Granulocytes: 0.09 10*3/uL — ABNORMAL HIGH (ref 0.00–0.07)
Basophils Absolute: 0 10*3/uL (ref 0.0–0.1)
Basophils Relative: 0 %
Eosinophils Absolute: 0 10*3/uL (ref 0.0–0.5)
Eosinophils Relative: 0 %
HCT: 31.1 % — ABNORMAL LOW (ref 39.0–52.0)
Hemoglobin: 10.2 g/dL — ABNORMAL LOW (ref 13.0–17.0)
Immature Granulocytes: 1 %
Lymphocytes Relative: 14 %
Lymphs Abs: 1.6 10*3/uL (ref 0.7–4.0)
MCH: 29.1 pg (ref 26.0–34.0)
MCHC: 32.8 g/dL (ref 30.0–36.0)
MCV: 88.6 fL (ref 80.0–100.0)
Monocytes Absolute: 1.5 10*3/uL — ABNORMAL HIGH (ref 0.1–1.0)
Monocytes Relative: 13 %
Neutro Abs: 8.3 10*3/uL — ABNORMAL HIGH (ref 1.7–7.7)
Neutrophils Relative %: 72 %
Platelets: 191 10*3/uL (ref 150–400)
RBC: 3.51 MIL/uL — ABNORMAL LOW (ref 4.22–5.81)
RDW: 14.6 % (ref 11.5–15.5)
WBC: 11.6 10*3/uL — ABNORMAL HIGH (ref 4.0–10.5)
nRBC: 0 % (ref 0.0–0.2)

## 2020-06-28 LAB — TROPONIN I (HIGH SENSITIVITY)
Troponin I (High Sensitivity): 139 ng/L (ref <18)
Troponin I (High Sensitivity): 84 ng/L — ABNORMAL HIGH (ref <18)

## 2020-06-28 LAB — GLUCOSE, CAPILLARY
Glucose-Capillary: 125 mg/dL — ABNORMAL HIGH (ref 70–99)
Glucose-Capillary: 141 mg/dL — ABNORMAL HIGH (ref 70–99)
Glucose-Capillary: 184 mg/dL — ABNORMAL HIGH (ref 70–99)
Glucose-Capillary: 342 mg/dL — ABNORMAL HIGH (ref 70–99)
Glucose-Capillary: 403 mg/dL — ABNORMAL HIGH (ref 70–99)
Glucose-Capillary: 426 mg/dL — ABNORMAL HIGH (ref 70–99)
Glucose-Capillary: 51 mg/dL — ABNORMAL LOW (ref 70–99)
Glucose-Capillary: 58 mg/dL — ABNORMAL LOW (ref 70–99)

## 2020-06-28 LAB — COMPREHENSIVE METABOLIC PANEL
ALT: 28 U/L (ref 0–44)
AST: 22 U/L (ref 15–41)
Albumin: 3.1 g/dL — ABNORMAL LOW (ref 3.5–5.0)
Alkaline Phosphatase: 62 U/L (ref 38–126)
Anion gap: 13 (ref 5–15)
BUN: 28 mg/dL — ABNORMAL HIGH (ref 8–23)
CO2: 21 mmol/L — ABNORMAL LOW (ref 22–32)
Calcium: 9.3 mg/dL (ref 8.9–10.3)
Chloride: 107 mmol/L (ref 98–111)
Creatinine, Ser: 1.18 mg/dL (ref 0.61–1.24)
GFR calc Af Amer: >60 mL/min (ref >60)
GFR calc non Af Amer: >60 mL/min (ref >60)
Glucose, Bld: 111 mg/dL — ABNORMAL HIGH (ref 70–99)
Potassium: 4.4 mmol/L (ref 3.5–5.1)
Sodium: 141 mmol/L (ref 135–145)
Total Bilirubin: 0.3 mg/dL (ref 0.3–1.2)
Total Protein: 5.6 g/dL — ABNORMAL LOW (ref 6.5–8.1)

## 2020-06-28 LAB — IRON AND TIBC
Iron: 70 ug/dL (ref 45–182)
Saturation Ratios: 28 % (ref 17.9–39.5)
TIBC: 251 ug/dL (ref 250–450)
UIBC: 181 ug/dL

## 2020-06-28 LAB — URINE CULTURE: Culture: NO GROWTH

## 2020-06-28 LAB — FERRITIN: Ferritin: 83 ng/mL (ref 24–336)

## 2020-06-28 LAB — LEGIONELLA PNEUMOPHILA SEROGP 1 UR AG: L. pneumophila Serogp 1 Ur Ag: NEGATIVE

## 2020-06-28 LAB — PHOSPHORUS: Phosphorus: 3.7 mg/dL (ref 2.5–4.6)

## 2020-06-28 LAB — MAGNESIUM: Magnesium: 2.2 mg/dL (ref 1.7–2.4)

## 2020-06-28 LAB — VITAMIN B12: Vitamin B-12: 824 pg/mL (ref 180–914)

## 2020-06-28 LAB — FOLATE: Folate: 14 ng/mL (ref >5.9)

## 2020-06-28 MED ORDER — HYDROCOD POLST-CPM POLST ER 10-8 MG/5ML PO SUER
5.0000 mL | Freq: Two times a day (BID) | ORAL | Status: DC | PRN
  Administered 2020-06-28 – 2020-06-29 (×2): 5 mL via ORAL
  Filled 2020-06-28 (×2): qty 5

## 2020-06-28 MED ORDER — LORAZEPAM 2 MG/ML IJ SOLN
0.2500 mg | Freq: Once | INTRAMUSCULAR | Status: AC
  Administered 2020-06-28: 0.25 mg via INTRAVENOUS
  Filled 2020-06-28: qty 1

## 2020-06-28 MED ORDER — LORAZEPAM 2 MG/ML IJ SOLN
0.2500 mg | Freq: Four times a day (QID) | INTRAMUSCULAR | Status: DC | PRN
  Administered 2020-06-29: 0.25 mg via INTRAVENOUS
  Filled 2020-06-28: qty 1

## 2020-06-28 MED ORDER — INSULIN ASPART 100 UNIT/ML ~~LOC~~ SOLN
15.0000 [IU] | Freq: Once | SUBCUTANEOUS | Status: AC
  Administered 2020-06-28: 15 [IU] via SUBCUTANEOUS

## 2020-06-28 MED ORDER — METHYLPREDNISOLONE SODIUM SUCC 125 MG IJ SOLR
40.0000 mg | Freq: Once | INTRAMUSCULAR | Status: AC
  Administered 2020-06-28: 40 mg via INTRAVENOUS
  Filled 2020-06-28: qty 2

## 2020-06-28 MED ORDER — INSULIN REGULAR HUMAN (CONC) 500 UNIT/ML ~~LOC~~ SOPN
80.0000 [IU] | PEN_INJECTOR | Freq: Three times a day (TID) | SUBCUTANEOUS | Status: DC
  Administered 2020-06-28 (×2): 80 [IU] via SUBCUTANEOUS
  Filled 2020-06-28: qty 3

## 2020-06-28 NOTE — Progress Notes (Signed)
Patient sitting on side of bed stating he could not breathe and his chest hurt, EKG performed and troponins drawn in last hour.  O2 sat 93% on 2 liters.  MD in to assess patient.  By the time MD arrived, pain had decreased from an 8 to a 3.  MD placed multiple new orders, SOB improved after breathing treatment.  Will continue to monitor.

## 2020-06-28 NOTE — Progress Notes (Signed)
PROGRESS NOTE    Sean Saunders  QPY:[id number] DOB: Nov 05, 1946 DOA: 06/26/2020 PCP: [place], Betty G, MD   Chief Complaint  Patient presents with  . Shortness of Breath   Brief Narrative:  Sean Saunders is Sean Saunders 74 y.o. male with history of COPD, peripheral vascular disease, diabetes mellitus type 2, hypothyroidism gout and chronic kidney disease has been experiencing shortness of breath with wheezing for the last 2 weeks had gone to his primary care physician about 2 weeks ago when he was prescribed steroids and cough suppressants.  Despite which his symptoms did not improve and he went to [place] where patient had CT of the sinuses and per the patient did not show anything acute and was prescribed for the cough suppressants.  Patient shortness of breath continued to worsen.  Has been having some productive cough wheezing.  Denies any chest pain nausea vomiting abdominal pain or diarrhea.  Had had Covid vaccination.  ED Course: In the ER patient is febrile with temperature of 101.2 F and on exam patient is wheezing bilaterally with chest x-ray showing nothing acute.  Labs showing creatinine 1.2 hemoglobin 9.2 blood glucose of 202.  EKG shows sinus tachycardia.  Patient admitted for acute respiratory failure hypoxia secondary to COPD exacerbation with fever.  UA is pending.  Covid test was negative.  Assessment & Plan:   Principal Problem:   Acute respiratory failure with hypoxia (HCC) Active Problems:   Peripheral vascular disease, unspecified (HCC)   Type 2 diabetes mellitus with diabetic neuropathy, unspecified (HCC)   CKD (chronic kidney disease), stage III   Vestibular schwannoma (HCC)   COPD with acute exacerbation (HCC)   COPD exacerbation ([place])  1. Acute respiratory failure with hypoxia secondary to COPD 1. CT chest without acute abnormality 2. Consistent with COPD exacerbation - exacerbated with activity with fits of coughing 3. Continue steroids, antibiotics, scheduled and prn  nebs - consider increasing steroid dose, but will try to keep as low as possible with uncontrolled BG's 4. Negative urine strep, pending legionella 5. Negative RVP, negative covid 6. IS, flutter, OOB, resp therapy and pt  2. Fever from bronchitis 1. RVP pending 2. Urine strep negative, pending legionella, negative covid 3. Follow blood and urine cultures  3. Diabetes mellitus type 2, uncontrolled with hyperglycemia and hypoglycemia 1. Follow a1c 8.2 2. He's on u500 insulin at home, but takes this 4 times daily with sliding scale that he manages (says he often takes 140-180 during the day with meals and is more careful with dosing at night) 3. BG's uncontrolled here (hyperglycemia and hypoglycemia) - continue u500 insulin and q4 SSI - adjust as needed (decrease to 80 u500 TID with hypoglycemia this AM) - will be difficult with steroids  4. History of peripheral vascular disease status post femoropopliteal bypass on Plavix and statin.  5. AKI on Chronic kidney disease stage III  1. hold lasix and lisinopril 2. Improved, continue to monitor   6. Chronic anemia, normocytic 1. Labs suggestive of AOCD, normal b12, folate, iron studies  7. Hypothyroidism on Synthroid.  8. History of gout on allopurinol.  9. Hypertension 1. Continue hydralazine, amlodipine 2. Holding lasix, lisinopril   10. Elevated troponin  1. Continues to be asymptomatic, trop bumped today, will follow ekg and serial troponin - suspect demand ischemia  11. History of schwannoma.  DVT prophylaxis: lovenox Code Status: DNR  Family Communication: daughter at bedside Disposition:   Status is: Inpatient  Remains inpatient appropriate because:Inpatient level of care  appropriate due to severity of illness   Dispo: The patient is from: Home              Anticipated d/c is to: Home              Anticipated d/c date is: > 3 days              Patient currently is not medically stable to d/c. Consultants:    none  Procedures:   none  Antimicrobials:  Anti-infectives (From admission, onward)   Start     Dose/Rate Route Frequency Ordered Stop   06/27/20 0430  cefTRIAXone (ROCEPHIN) 2 g in sodium chloride 0.9 % 100 mL IVPB     Discontinue     2 g 200 mL/hr over 30 Minutes Intravenous Daily 06/27/20 0401 07/02/20 0959   06/27/20 0430  azithromycin (ZITHROMAX) 500 mg in sodium chloride 0.9 % 250 mL IVPB     Discontinue     500 mg 250 mL/hr over 60 Minutes Intravenous Daily 06/27/20 0401 07/02/20 0959   06/26/20 2245  doxycycline (VIBRAMYCIN) 100 mg in sodium chloride 0.9 % 250 mL IVPB        100 mg 125 mL/hr over 120 Minutes Intravenous  Once 06/26/20 2244 06/27/20 0225     Subjective: Shortness of breath feels about the same  Objective: Vitals:   06/28/20 0800 06/28/20 0925 06/28/20 1223 06/28/20 1403  BP:  (!) 153/74 136/85   Pulse:  96 96   Resp:  20 19   Temp:  98.1 F (36.7 C) 98.3 F (36.8 C)   TempSrc:  Oral Oral   SpO2: 92% 95% 91% (!) 89%  Weight:      Height:        Intake/Output Summary (Last 24 hours) at 06/28/2020 1438 Last data filed at 06/28/2020 0942 Gross per 24 hour  Intake 1527.87 ml  Output 2625 ml  Net -1097.13 ml   Filed Weights   06/27/20 1955  Weight: 107 kg    Examination:  General: No acute distress. Cardiovascular: Heart sounds show Cionna Collantes regular rate, and rhythm Lungs: scattered wheezing, rhonchi - upper airway sounds as well - cough worsened after activity Abdomen: Soft, nontender, nondistended Neurological: Alert and oriented 3. Moves all extremities 4 . Cranial nerves II through XII grossly intact. Skin: Warm and dry. No rashes or lesions. Extremities: No clubbing or cyanosis. No edema.   Data Reviewed: I have personally reviewed following labs and imaging studies  CBC: Recent Labs  Lab 06/26/20 2158 06/27/20 0509 06/28/20 0522  WBC 9.4 6.4 11.6*  NEUTROABS 6.5  --  8.3*  HGB 11.2* 9.9* 10.2*  HCT 34.7* 31.2* 31.1*  MCV  88.1 91.2 88.6  PLT 191 161 829    Basic Metabolic Panel: Recent Labs  Lab 06/26/20 2158 06/27/20 0509 06/28/20 0522  NA 137 131* 141  K 4.3 3.9 4.4  CL 105 100 107  CO2 21* 19* 21*  GLUCOSE 202* 484* 111*  BUN 21 24* 28*  CREATININE 1.25* 1.61* 1.18  CALCIUM 8.8* 8.2* 9.3  MG  --   --  2.2  PHOS  --   --  3.7    GFR: Estimated Creatinine Clearance: 68.4 mL/min (by C-G formula based on SCr of 1.18 mg/dL).  Liver Function Tests: Recent Labs  Lab 06/26/20 2158 06/27/20 0509 06/28/20 0522  AST 23 22 22   ALT 28 26 28   ALKPHOS 70 61 62  BILITOT 0.7 0.5 0.3  PROT 6.3*  5.3* 5.6*  ALBUMIN 3.4* 3.0* 3.1*    CBG: Recent Labs  Lab 06/28/20 0449 06/28/20 0755 06/28/20 0759 06/28/20 0848 06/28/20 1108  GLUCAP 125* 51* 58* 141* 184*     Recent Results (from the past 240 hour(s))  SARS Coronavirus 2 by RT PCR (hospital order, performed in [facility] hospital lab) Nasopharyngeal Nasopharyngeal Swab     Status: None   Collection Time: 06/26/20 11:33 PM   Specimen: Nasopharyngeal Swab  Result Value Ref Range Status   SARS Coronavirus 2 NEGATIVE NEGATIVE Final    Comment: (NOTE) SARS-CoV-2 target nucleic acids are NOT DETECTED.  The SARS-CoV-2 RNA is generally detectable in upper and lower respiratory specimens during the acute phase of infection. The lowest concentration of SARS-CoV-2 viral copies this assay can detect is 250 copies / mL. Shareese Macha negative result does not preclude SARS-CoV-2 infection and should not be used as the sole basis for treatment or other patient management decisions.  Nakiesha Rumsey negative result may occur with improper specimen collection / handling, submission of specimen other than nasopharyngeal swab, presence of viral mutation(s) within the areas targeted by this assay, and inadequate number of viral copies (<250 copies / mL). Kent Riendeau negative result must be combined with clinical observations, patient history, and epidemiological information.  Fact  Sheet for Patients:   StrictlyIdeas.no  Fact Sheet for Healthcare Providers: BankingDealers.co.za  This test is not yet approved or  cleared by the [place] FDA and has been authorized for detection and/or diagnosis of SARS-CoV-2 by FDA under an Emergency Use Authorization (EUA).  This EUA will remain in effect (meaning this test can be used) for the duration of the COVID-19 declaration under Section 564(b)(1) of the Act, 21 U.S.C. section 360bbb-3(b)(1), unless the authorization is terminated or revoked sooner.  Performed at [facility], [place] [address], [place], [place] [address]   Blood culture (routine x 2)     Status: None (Preliminary result)   Collection Time: 06/26/20 11:33 PM   Specimen: BLOOD  Result Value Ref Range Status   Specimen Description   Final    BLOOD LEFT ANTECUBITAL Performed at [facility] [address], [place], [place] [address]    Special Requests   Final    BOTTLES DRAWN AEROBIC AND ANAEROBIC Blood Culture adequate volume Performed at [facility] [address], [place], [place] [address]    Culture   Final    NO GROWTH 1 DAY Performed at [facility], [place] [address] [place], [place] [address]    Report Status PENDING  Incomplete  Urine culture     Status: None   Collection Time: 06/27/20  5:47 AM   Specimen: Urine, Random  Result Value Ref Range Status   Specimen Description   Final    URINE, RANDOM Performed at [facility] [address], [place], [place] [address]    Special Requests   Final    NONE Performed at [facility], [place] [address] [place], [place] [address]    Culture   Final    NO GROWTH Performed at [facility], [place] [address], [place], [place] [address]    Report Status 06/28/2020 FINAL  Final  Blood culture (routine x 2)     Status: None (Preliminary  result)   Collection Time: 06/27/20  8:00 AM   Specimen: BLOOD  Result Value Ref Range Status   Specimen Description   Final  BLOOD RIGHT ANTECUBITAL Performed at [facility] [address] [place], [place] [address]    Special Requests   Final    BOTTLES DRAWN AEROBIC AND ANAEROBIC Blood Culture results may not be optimal due to an inadequate volume of blood received in culture bottles Performed at [facility] [address] [place], [place] [address]    Culture   Final    NO GROWTH 1 DAY Performed at [facility], [place] [address], [place], [place] [address]    Report Status PENDING  Incomplete  Respiratory Panel by PCR     Status: None   Collection Time: 06/27/20  8:00 AM   Specimen: Nasopharyngeal Swab; Respiratory  Result Value Ref Range Status   Adenovirus NOT DETECTED NOT DETECTED Final   Coronavirus 229E NOT DETECTED NOT DETECTED Final    Comment: (NOTE) The Coronavirus on the Respiratory Panel, DOES NOT test for the novel  Coronavirus (2019 nCoV)    Coronavirus HKU1 NOT DETECTED NOT DETECTED Final   Coronavirus NL63 NOT DETECTED NOT DETECTED Final   Coronavirus OC43 NOT DETECTED NOT DETECTED Final   Metapneumovirus NOT DETECTED NOT DETECTED Final   Rhinovirus / Enterovirus NOT DETECTED NOT DETECTED Final   Influenza Srijan Givan NOT DETECTED NOT DETECTED Final   Influenza B NOT DETECTED NOT DETECTED Final   Parainfluenza Virus 1 NOT DETECTED NOT DETECTED Final   Parainfluenza Virus 2 NOT DETECTED NOT DETECTED Final   Parainfluenza Virus 3 NOT DETECTED NOT DETECTED Final   Parainfluenza Virus 4 NOT DETECTED NOT DETECTED Final   Respiratory Syncytial Virus NOT DETECTED NOT DETECTED Final   Bordetella pertussis NOT DETECTED NOT DETECTED Final   Chlamydophila pneumoniae NOT DETECTED NOT DETECTED Final   Mycoplasma pneumoniae NOT DETECTED NOT DETECTED Final    Comment: Performed at [facility], [place]. [address]  [place], [place] [address]         Radiology Studies: DG Chest 2 View  Result Date: 06/26/2020 CLINICAL DATA:  Cough. EXAM: CHEST - 2 VIEW COMPARISON:  December 31, 2019 FINDINGS: There is no evidence of acute infiltrate, pleural effusion or pneumothorax. The heart size and mediastinal contours are within normal limits. There is mild calcification of the aortic arch. The visualized skeletal structures are unremarkable. IMPRESSION: No active cardiopulmonary disease. Electronically Signed   By: Virgina Norfolk M.D.   On: 06/26/2020 22:37   CT CHEST WO CONTRAST  Result Date: 06/27/2020 CLINICAL DATA:  Cough and shortness of breath for the past 3 weeks. EXAM: CT CHEST WITHOUT CONTRAST TECHNIQUE: Multidetector CT imaging of the chest was performed following the standard protocol without IV contrast. COMPARISON:  Chest radiographs obtained yesterday. FINDINGS: Cardiovascular: Atheromatous calcifications, including the coronary arteries and aorta. Minimal pericardial effusion with Ludy Messamore maximum thickness of 8 mm. Mediastinum/Nodes: No enlarged mediastinal or axillary lymph nodes. Thyroid gland, trachea, and esophagus demonstrate no significant findings. Lungs/Pleura: Mild bilateral bullous changes in Kery Batzel centrilobular distribution. No airspace consolidation or pleural fluid. Upper Abdomen: Multiple small gallstones in the gallbladder measuring up to 7 mm in maximum diameter each. No gallbladder wall thickening or pericholecystic fluid. Musculoskeletal: Thoracic and lower cervical spine degenerative changes. IMPRESSION: 1. No acute abnormality. 2. Mild changes of COPD. 3. Minimal pericardial effusion. 4. Cholelithiasis. 5.  Calcific coronary artery and aortic atherosclerosis. Aortic Atherosclerosis (ICD10-I70.0) and Emphysema (ICD10-J43.9). Electronically Signed   By: Claudie Revering M.D.   On: 06/27/2020 14:26        Scheduled Meds: .  allopurinol  100 mg Oral BID  . amLODipine  10 mg Oral Daily  . budesonide  (PULMICORT) nebulizer solution  0.25 mg Nebulization BID  . clopidogrel  75 mg Oral Daily  . enoxaparin (LOVENOX) injection  40 mg Subcutaneous Q24H  . fluticasone  2 spray Each Nare Daily  . hydrALAZINE  25 mg Oral TID  . insulin aspart  0-20 Units Subcutaneous Q4H  . insulin regular human CONCENTRATED  80 Units Subcutaneous TID WC  . ipratropium-albuterol  3 mL Nebulization Q6H  . levothyroxine  150 mcg Oral Q0600  . methylPREDNISolone (SOLU-MEDROL) injection  40 mg Intravenous Daily  . multivitamin with minerals  1 tablet Oral Daily  . rOPINIRole  0.25 mg Oral QHS  . rosuvastatin  20 mg Oral Daily  . tamsulosin  0.4 mg Oral QPC supper   Continuous Infusions: . azithromycin 500 mg (06/28/20 1007)  . cefTRIAXone (ROCEPHIN)  IV 2 g (06/28/20 0928)  . lactated ringers 125 mL/hr at 06/28/20 0926     LOS: 1 day    Time spent: over 30 min    Fayrene Helper, MD [facility]   To contact the attending provider between 7A-7P or the covering provider during after hours 7P-7A, please log into the web site [email or web address] and access using universal [place] password for that web site. If you do not have the password, please call the hospital operator.  06/28/2020, 2:38 PM

## 2020-06-28 NOTE — Progress Notes (Signed)
CBG 58, patient eating breakfast, will recheck in 15 minutes as per protocol.

## 2020-06-28 NOTE — Plan of Care (Signed)

## 2020-06-28 NOTE — Progress Notes (Signed)
Respiratory panel and Covid (-), droplet precautions discontinued as per protocol.

## 2020-06-29 ENCOUNTER — Encounter (HOSPITAL_COMMUNITY): Payer: Self-pay | Admitting: Family Medicine

## 2020-06-29 ENCOUNTER — Inpatient Hospital Stay (HOSPITAL_COMMUNITY): Payer: Medicare HMO

## 2020-06-29 DIAGNOSIS — J9601 Acute respiratory failure with hypoxia: Secondary | ICD-10-CM

## 2020-06-29 LAB — BLOOD GAS, ARTERIAL
Acid-Base Excess: 1.3 mmol/L (ref 0.0–2.0)
Bicarbonate: 24.8 mmol/L (ref 20.0–28.0)
Drawn by: 270211
FIO2: 28
O2 Saturation: 96.3 %
Patient temperature: 98.6
pCO2 arterial: 37.3 mmHg (ref 32.0–48.0)
pH, Arterial: 7.437 (ref 7.350–7.450)
pO2, Arterial: 81.9 mmHg — ABNORMAL LOW (ref 83.0–108.0)

## 2020-06-29 LAB — COMPREHENSIVE METABOLIC PANEL
ALT: 36 U/L (ref 0–44)
AST: 29 U/L (ref 15–41)
Albumin: 3.5 g/dL (ref 3.5–5.0)
Alkaline Phosphatase: 73 U/L (ref 38–126)
Anion gap: 9 (ref 5–15)
BUN: 34 mg/dL — ABNORMAL HIGH (ref 8–23)
CO2: 24 mmol/L (ref 22–32)
Calcium: 9.3 mg/dL (ref 8.9–10.3)
Chloride: 107 mmol/L (ref 98–111)
Creatinine, Ser: 1.39 mg/dL — ABNORMAL HIGH (ref 0.61–1.24)
GFR calc Af Amer: 57 mL/min — ABNORMAL LOW (ref >60)
GFR calc non Af Amer: 49 mL/min — ABNORMAL LOW (ref >60)
Glucose, Bld: 76 mg/dL (ref 70–99)
Potassium: 5 mmol/L (ref 3.5–5.1)
Sodium: 140 mmol/L (ref 135–145)
Total Bilirubin: 0.3 mg/dL (ref 0.3–1.2)
Total Protein: 6.6 g/dL (ref 6.5–8.1)

## 2020-06-29 LAB — CBC WITH DIFFERENTIAL/PLATELET
Abs Immature Granulocytes: 0.11 10*3/uL — ABNORMAL HIGH (ref 0.00–0.07)
Basophils Absolute: 0 10*3/uL (ref 0.0–0.1)
Basophils Relative: 0 %
Eosinophils Absolute: 0 10*3/uL (ref 0.0–0.5)
Eosinophils Relative: 0 %
HCT: 34.4 % — ABNORMAL LOW (ref 39.0–52.0)
Hemoglobin: 11 g/dL — ABNORMAL LOW (ref 13.0–17.0)
Immature Granulocytes: 1 %
Lymphocytes Relative: 10 %
Lymphs Abs: 1.3 10*3/uL (ref 0.7–4.0)
MCH: 28.5 pg (ref 26.0–34.0)
MCHC: 32 g/dL (ref 30.0–36.0)
MCV: 89.1 fL (ref 80.0–100.0)
Monocytes Absolute: 0.9 10*3/uL (ref 0.1–1.0)
Monocytes Relative: 6 %
Neutro Abs: 11.1 10*3/uL — ABNORMAL HIGH (ref 1.7–7.7)
Neutrophils Relative %: 83 %
Platelets: 237 10*3/uL (ref 150–400)
RBC: 3.86 MIL/uL — ABNORMAL LOW (ref 4.22–5.81)
RDW: 14.6 % (ref 11.5–15.5)
WBC: 13.4 10*3/uL — ABNORMAL HIGH (ref 4.0–10.5)
nRBC: 0 % (ref 0.0–0.2)

## 2020-06-29 LAB — PHOSPHORUS: Phosphorus: 4.2 mg/dL (ref 2.5–4.6)

## 2020-06-29 LAB — GLUCOSE, CAPILLARY
Glucose-Capillary: 117 mg/dL — ABNORMAL HIGH (ref 70–99)
Glucose-Capillary: 128 mg/dL — ABNORMAL HIGH (ref 70–99)
Glucose-Capillary: 248 mg/dL — ABNORMAL HIGH (ref 70–99)
Glucose-Capillary: 341 mg/dL — ABNORMAL HIGH (ref 70–99)
Glucose-Capillary: 402 mg/dL — ABNORMAL HIGH (ref 70–99)
Glucose-Capillary: 423 mg/dL — ABNORMAL HIGH (ref 70–99)
Glucose-Capillary: 49 mg/dL — ABNORMAL LOW (ref 70–99)

## 2020-06-29 LAB — TROPONIN I (HIGH SENSITIVITY)
Troponin I (High Sensitivity): 37 ng/L — ABNORMAL HIGH (ref <18)
Troponin I (High Sensitivity): 36 ng/L — ABNORMAL HIGH (ref <18)

## 2020-06-29 LAB — MAGNESIUM: Magnesium: 2.3 mg/dL (ref 1.7–2.4)

## 2020-06-29 MED ORDER — INSULIN ASPART 100 UNIT/ML ~~LOC~~ SOLN
0.0000 [IU] | SUBCUTANEOUS | Status: DC
  Administered 2020-06-29: 15 [IU] via SUBCUTANEOUS
  Administered 2020-06-29 – 2020-06-30 (×2): 5 [IU] via SUBCUTANEOUS

## 2020-06-29 MED ORDER — ARFORMOTEROL TARTRATE 15 MCG/2ML IN NEBU
15.0000 ug | INHALATION_SOLUTION | Freq: Two times a day (BID) | RESPIRATORY_TRACT | Status: DC
  Administered 2020-06-29 – 2020-07-01 (×4): 15 ug via RESPIRATORY_TRACT
  Filled 2020-06-29 (×4): qty 2

## 2020-06-29 MED ORDER — SALINE SPRAY 0.65 % NA SOLN
1.0000 | Freq: Two times a day (BID) | NASAL | Status: DC
  Administered 2020-06-29 – 2020-07-01 (×5): 1 via NASAL
  Filled 2020-06-29: qty 44

## 2020-06-29 MED ORDER — ALBUTEROL SULFATE (2.5 MG/3ML) 0.083% IN NEBU
2.5000 mg | INHALATION_SOLUTION | Freq: Two times a day (BID) | RESPIRATORY_TRACT | Status: DC
  Administered 2020-06-30 – 2020-07-01 (×3): 2.5 mg via RESPIRATORY_TRACT
  Filled 2020-06-29 (×3): qty 3

## 2020-06-29 MED ORDER — INSULIN REGULAR HUMAN (CONC) 500 UNIT/ML ~~LOC~~ SOPN
70.0000 [IU] | PEN_INJECTOR | Freq: Three times a day (TID) | SUBCUTANEOUS | Status: DC
  Administered 2020-06-29 (×2): 70 [IU] via SUBCUTANEOUS

## 2020-06-29 MED ORDER — INSULIN ASPART 100 UNIT/ML ~~LOC~~ SOLN
15.0000 [IU] | Freq: Once | SUBCUTANEOUS | Status: AC
  Administered 2020-06-29: 15 [IU] via SUBCUTANEOUS

## 2020-06-29 MED ORDER — REVEFENACIN 175 MCG/3ML IN SOLN
175.0000 ug | Freq: Every day | RESPIRATORY_TRACT | Status: DC
  Administered 2020-06-29 – 2020-07-01 (×3): 175 ug via RESPIRATORY_TRACT
  Filled 2020-06-29 (×3): qty 3

## 2020-06-29 MED ORDER — PANTOPRAZOLE SODIUM 40 MG IV SOLR
40.0000 mg | INTRAVENOUS | Status: DC
  Administered 2020-06-29 – 2020-06-30 (×2): 40 mg via INTRAVENOUS
  Filled 2020-06-29 (×2): qty 40

## 2020-06-29 MED ORDER — LORAZEPAM 2 MG/ML IJ SOLN
0.5000 mg | Freq: Once | INTRAMUSCULAR | Status: AC
  Administered 2020-06-29: 0.5 mg via INTRAVENOUS
  Filled 2020-06-29: qty 1

## 2020-06-29 MED ORDER — BUDESONIDE 0.5 MG/2ML IN SUSP
0.5000 mg | Freq: Two times a day (BID) | RESPIRATORY_TRACT | Status: DC
  Administered 2020-06-29 – 2020-07-01 (×4): 0.5 mg via RESPIRATORY_TRACT
  Filled 2020-06-29 (×4): qty 2

## 2020-06-29 MED ORDER — ALUM & MAG HYDROXIDE-SIMETH 200-200-20 MG/5ML PO SUSP
30.0000 mL | Freq: Once | ORAL | Status: AC
  Administered 2020-06-29: 30 mL via ORAL
  Filled 2020-06-29: qty 30

## 2020-06-29 MED ORDER — ALBUTEROL SULFATE (2.5 MG/3ML) 0.083% IN NEBU: 2.5000 mg | INHALATION_SOLUTION | Freq: Four times a day (QID) | RESPIRATORY_TRACT | Status: DC | PRN

## 2020-06-29 NOTE — Consult Note (Addendum)
NAME:  Sean Saunders, MRN:  [id number], DOB:  Feb 20, 1946, LOS: 2 ADMISSION DATE:  06/26/2020, CONSULTATION DATE: 7/19 REFERRING MD: Dr. Florene Glen / [facility], CHIEF COMPLAINT: Shortness of Breath    Brief History   74 y/o M, ongoing smoker, with COPD admitted 7/16 with 3 weeks of shortness of breath, sinus pressure and decreased activity tolerance.    History of present illness   74 y/o M who presented to [facility] on 7/16 with a two week history of shortness of breath and wheezing.  The patient reports he has had at least one month of shortness, sinus pressure and decreased activity tolerance.  He reports he can barely walk 50 feet. He was seen at his PCP office approximately 2 weeks before admit and was treated with steroids and a cough suppressant.  He did not improve and went to the [place] where he had a CT of the sinuses which he states "I was full up here pointing to his sinuses but nothing in my chest".  He was given cough suppressants.  He continued to have shortness of breath.    On 7/16 he reported to the ER for further evaluation.  On presentation, he was noted to be febrile to 101.2, hgb 9.2, sr cr 1.2, and glucose of 202.  COVID testing negative.  Urine strep and legionella negative. The patient reports he has completed vaccination.  The patient was admitted with the working diagnosis of a COPD exacerbation and treated with IV steroids, Q6 duoneb, BID pulmicort, cough suppression and empiric IV antibiotics.    He is tearful on exam and states he is "ready to go", reports chest pain, shortness of breath and anxiousness.  His family reports he called hospice before admission. He reports lower extremity, hip pain and shortness of breath as limiting factors for activity.    PCCM consulted for evaluation.   Past Medical History  TIA - multiple in 2012 with residual left sided weakness HTN HLD DVT  COPD - PFT 05/2018 with mixed restriction and obstruction, severe airflow limitation, FVC 2.89 / 61%,  FEV1 1.7 / 49%, FEV1 / FVC ratio 59. DM  Hypothyroidism  GERD  PVD s/p Femoral-Popliteal Bypass Graft   Significant Hospital Events   7/16 Admit  7/19 PCCM consulted  Consults:    Procedures:    Significant Diagnostic Tests:  CT Chest w/o 7/17 >> no acute abnormality, mild changes of COPD, minimal pericardial effusion, cholelithiasis, calcific coronary artery and aortic athererosclerosis  Micro Data:  COVID 7/16 >> negative  RVP 7/17 >> negative  UC 7/17 >> negative  U. Strep Antige 7/17 >> negative  U. Legionella 7/17 >> negative  BCx2 7/17 >>   Antimicrobials:  Azithromycin 7/17 >> Ceftriaxone 7/17 >>  Interim history/subjective:  As above   Objective   Blood pressure (!) 180/78, pulse 99, temperature 97.7 F (36.5 C), temperature source Oral, resp. rate 19, height 6' (1.829 m), weight 106.5 kg, SpO2 96 %.        Intake/Output Summary (Last 24 hours) at 06/29/2020 1321 Last data filed at 06/29/2020 1028 Gross per 24 hour  Intake 349.03 ml  Output 2000 ml  Net -1650.97 ml   Filed Weights   06/27/20 1955 06/29/20 0506  Weight: 107 kg 106.5 kg    Examination: General: adult male lying in bed in NAD PSY: becomes tearful during interview, anxious when talking about his health   HEENT: MM pink/moist, [place] O2 Neuro: AAOx4, speech clear, MAE CV: s1s2 RRR, no  m/r/g PULM: prolonged exp phase, wheezing bilaterally  GI: soft, bsx4 active  Extremities: warm/dry, no edema  Skin: no rashes or lesions  Resolved Hospital Problem list      Assessment & Plan:   Dyspnea  Acute Exacerbation of Obstructive Lung Disease / COPD  Suspected Sinusitis Acute Hypoxic Respiratory Failure  Emphysema noted on CT, no acute infiltrate or effusions. COVID / RVP negative, legionella, strep negative.  -continue flonase, add nasal saline  -adjust pulmicort dosing  -add brovana, yupelri  -continue solumedrol 40 mg IV QD -continue abx as above -assess O2 needs prior to discharge   -DNR / DNI   Chest Pain  -assess troponin, EKG now  -GI cocktail x1  Anxiety / Suspected Depression  -consider palliative consult for symptom control  Best practice:  Diet: per primary  DVT prophylaxis: lovenox  GI prophylaxis: n/a Glucose control: per primary  Mobility: as tolerated  Code Status: DNR Family Communication: Patient, daughter and son-in-law updated at bedside 7/19 Disposition: per primary   Labs   CBC: Recent Labs  Lab 06/26/20 2158 06/27/20 0509 06/28/20 0522 06/29/20 0519  WBC 9.4 6.4 11.6* 13.4*  NEUTROABS 6.5  --  8.3* 11.1*  HGB 11.2* 9.9* 10.2* 11.0*  HCT 34.7* 31.2* 31.1* 34.4*  MCV 88.1 91.2 88.6 89.1  PLT 191 161 191 341    Basic Metabolic Panel: Recent Labs  Lab 06/26/20 2158 06/27/20 0509 06/28/20 0522 06/29/20 0519  NA 137 131* 141 140  K 4.3 3.9 4.4 5.0  CL 105 100 107 107  CO2 21* 19* 21* 24  GLUCOSE 202* 484* 111* 76  BUN 21 24* 28* 34*  CREATININE 1.25* 1.61* 1.18 1.39*  CALCIUM 8.8* 8.2* 9.3 9.3  MG  --   --  2.2 2.3  PHOS  --   --  3.7 4.2   GFR: Estimated Creatinine Clearance: 57.9 mL/min (A) (by C-G formula based on SCr of 1.39 mg/dL (H)). Recent Labs  Lab 06/26/20 2158 06/26/20 2246 06/27/20 0509 06/28/20 0522 06/29/20 0519  PROCALCITON  --   --  <0.10  --   --   WBC 9.4  --  6.4 11.6* 13.4*  LATICACIDVEN  --  1.3  --   --   --     Liver Function Tests: Recent Labs  Lab 06/26/20 2158 06/27/20 0509 06/28/20 0522 06/29/20 0519  AST 23 22 22 29   ALT 28 26 28  36  ALKPHOS 70 61 62 73  BILITOT 0.7 0.5 0.3 0.3  PROT 6.3* 5.3* 5.6* 6.6  ALBUMIN 3.4* 3.0* 3.1* 3.5   No results for input(s): LIPASE, AMYLASE in the last 168 hours. No results for input(s): AMMONIA in the last 168 hours.  ABG    Component Value Date/Time   PHART 7.437 06/29/2020 0810   PCO2ART 37.3 06/29/2020 0810   PO2ART 81.9 (L) 06/29/2020 0810   HCO3 24.8 06/29/2020 0810   TCO2 21 06/04/2012 0831   O2SAT 96.3 06/29/2020 0810      Coagulation Profile: No results for input(s): INR, PROTIME in the last 168 hours.  Cardiac Enzymes: No results for input(s): CKTOTAL, CKMB, CKMBINDEX, TROPONINI in the last 168 hours.  HbA1C: Hgb A1c MFr Bld  Date/Time Value Ref Range Status  06/27/2020 08:56 AM 8.2 (H) 4.8 - 5.6 % Final    Comment:    (NOTE) Pre diabetes:          5.7%-6.4%  Diabetes:              >  6.4%  Glycemic control for   <7.0% adults with diabetes   12/31/2019 05:18 PM 7.7 (H) 4.8 - 5.6 % Final    Comment:    (NOTE) Pre diabetes:          5.7%-6.4% Diabetes:              >6.4% Glycemic control for   <7.0% adults with diabetes     CBG: Recent Labs  Lab 06/29/20 0011 06/29/20 0448 06/29/20 0801 06/29/20 0849 06/29/20 1145  GLUCAP 341* 117* 49* 128* 248*    Review of Systems: Positives in [place]   Gen: Denies fever, chills, weight change, fatigue, night sweats HEENT: Denies blurred vision, double vision, hearing loss, tinnitus, sinus congestion, rhinorrhea, sore throat, neck stiffness, dysphagia PULM: Denies shortness of breath, cough, sputum production, hemoptysis, wheezing CV: Denies chest pain, edema, orthopnea, paroxysmal nocturnal dyspnea, palpitations GI: Denies abdominal pain, nausea, vomiting, diarrhea, hematochezia, melena, constipation, change in bowel habits GU: Denies dysuria, hematuria, polyuria, oliguria, urethral discharge Endocrine: Denies hot or cold intolerance, polyuria, polyphagia or appetite change Derm: Denies rash, dry skin, scaling or peeling skin change Heme: Denies easy bruising, bleeding, bleeding gums Neuro: Denies headache, numbness, weakness, slurred speech, loss of memory or consciousness  Past Medical History  He,  has a past medical history of Arthritis, Bronchitis, COPD (chronic obstructive pulmonary disease) ([place]), Diabetes mellitus, DVT (deep venous thrombosis) ([place]), GERD (gastroesophageal reflux disease), Hyperlipidemia, Hypertension, Hypothyroidism,  Onychomycosis (06/07/2013), Stroke [facility]), Thyroid disease, TIA (transient ischemic attack) (08/03/2011), and TIA (transient ischemic attack).   Surgical History    Past Surgical History:  Procedure Laterality Date  . ABDOMINAL AORTAGRAM N/A 06/04/2012   Procedure: ABDOMINAL Maxcine Ham;  Surgeon: Angelia Mould, MD;  Location[facility] CATH LAB;  Service: Cardiovascular;  Laterality: N/A;  . APPENDECTOMY    . COLONOSCOPY    . FEMORAL-POPLITEAL BYPASS GRAFT  07/05/2012   Procedure: BYPASS GRAFT FEMORAL-POPLITEAL ARTERY;  Surgeon: Angelia Mould, MD;  Location: [facility] OR;  Service: Vascular;  Laterality: Left;  Revision Left fem/pop w/ artery patch angioplasty utilizing left GSV graft and extenion to tib/per trunk w/right GSV and interop arteriogram  . JOINT REPLACEMENT  2011   -Hip  . NASAL SINUS SURGERY    . PR VEIN BYPASS GRAFT,AORTO-FEM-POP    . PR VEIN BYPASS GRAFT,AORTO-FEM-POP  08-01-11   (L) Fem-pop BP  . toe nail    . TONSILLECTOMY       Social History   reports that he quit smoking about 2 years ago. His smoking use included cigarettes. He has a 25.00 pack-year smoking history. He has never used smokeless tobacco. He reports current alcohol use. He reports that he does not use drugs.   Family History   His family history includes Diabetes in his father, mother, and sister; Other in his father; Stroke in his father and mother.   Allergies Allergies  Allergen Reactions  . Eszopiclone Itching  . Niaspan [Niacin Er] Nausea And Vomiting and Rash     Home Medications  Prior to Admission medications   Medication Sig Start Date End Date Taking? Authorizing Provider  allopurinol (ZYLOPRIM) 100 MG tablet Take 100 mg by mouth 2 (two) times daily.   Yes [provider]  amLODipine (NORVASC) 10 MG tablet Take 10 mg by mouth daily.   Yes [provider]  clopidogrel (PLAVIX) 75 MG tablet Take 75 mg by mouth daily.     Yes [provider]  COMBIVENT RESPIMAT  20-100 MCG/ACT AERS  respimat Inhale 2 puffs into the lungs 3 (three) times daily. 04/19/20  Yes [provider]  fluticasone (FLONASE) 50 MCG/ACT nasal spray Place 2 sprays into both nostrils daily. 08/07/19  Yes [place], Betty G, MD  furosemide (LASIX) 20 MG tablet Take 20 mg by mouth every other day.    Yes [provider]  hydrALAZINE (APRESOLINE) 50 MG tablet Take 50 mg by mouth every morning. 05/08/20  Yes [provider]  hydrochlorothiazide (HYDRODIURIL) 25 MG tablet Take 25 mg by mouth daily. 06/07/20  Yes [provider]  insulin regular human CONCENTRATED (HUMULIN R) 500 UNIT/ML injection Inject 110-140 Units into the skin See admin instructions. 110 units at breakfast, 140 units at lunch, 110 units at dinner   Yes [provider]  levothyroxine (SYNTHROID, LEVOTHROID) 150 MCG tablet Take 150 mcg by mouth daily before breakfast.   Yes [provider]  lisinopril (ZESTRIL) 40 MG tablet Take 40 mg by mouth daily.   Yes [provider]  metFORMIN (GLUCOPHAGE) 500 MG tablet Take 500 mg by mouth 2 (two) times daily with a meal.   Yes [provider]  Multiple Vitamin (MULTIVITAMIN WITH MINERALS) TABS tablet Take 1 tablet by mouth daily.   Yes [provider]  rOPINIRole (REQUIP) 0.25 MG tablet Take 0.25 mg by mouth at bedtime.   Yes [provider]  rosuvastatin (CRESTOR) 40 MG tablet Take 20 mg by mouth daily.  04/06/20  Yes [provider]  tamsulosin (FLOMAX) 0.4 MG CAPS capsule Take 0.4 mg by mouth.   Yes [provider]  benzonatate (TESSALON PERLES) 100 MG capsule Take 1 capsule (100 mg total) by mouth 2 (two) times daily as needed. Patient not taking: Reported on 06/27/2020 06/16/20   Lucretia Kern, DO     Critical care time: n/a    Noe Gens, MSN, NP-C [facility] 06/29/2020, 1:21 PM   Please see [email or web address] for pager details.

## 2020-06-29 NOTE — Plan of Care (Signed)

## 2020-06-29 NOTE — TOC Progression Note (Signed)
Transition of Care [facility]) - Progression Note    Patient Details  Name: Sean Saunders MRN: [id number] Date of Birth: [birth year]  [facility] CM/SW Contact  Purcell Mouton, RN Phone Number: 06/29/2020, 3:17 PM  Clinical Narrative:    TOC will continue to follow for discharge needs.    Expected Discharge Plan: Home/Self Care Barriers to Discharge: No Barriers Identified  Expected Discharge Plan and Services Expected Discharge Plan: Home/Self Care       Living arrangements for the past 2 months: Single Family Home                                       Social Determinants of Health (SDOH) Interventions    Readmission Risk Interventions No flowsheet data found.

## 2020-06-29 NOTE — Progress Notes (Signed)
Inpatient Diabetes Program Recommendations  AACE/ADA: New Consensus Statement on Inpatient Glycemic Control (2015)  Target Ranges:  Prepandial:   less than 140 mg/dL      Peak postprandial:   less than 180 mg/dL (1-2 hours)      Critically ill patients:  140 - 180 mg/dL   Lab Results  Component Value Date   GLUCAP 248 (H) 06/29/2020   HGBA1C 8.2 (H) 06/27/2020    Review of Glycemic Control  Diabetes history: DM2 Outpatient Diabetes medications: U-500 110-140-110 units tidwc, metformin 500 mg bid Current orders for Inpatient glycemic control: U-500 70 units tidwc, Novolog 0-15 units tidwc and 0-5 units QHS.  HgbA1C - 8.2%  Inpatient Diabetes Program Recommendations:     Agree with U-500 70 units tid. If FBS < 80 mg/dL, decrease U-500 to 65 units tid.  Agree with Novolog 0-15 units tidwc and hs  Long discussion with pt, daughter and son-in-law. Pt sees Endo at [facility] and basically doses his own insulin. He goes by a base of 110-140-110 units tid, although if blood sugars less than 100, he will take significantly less. If blood sugars are above 300, he will take more insulin. States his doctor told him "to figure out how much he needs by monitoring blood sugars 3-4x/day." Discussed healthy meals, portion control and hypoglycemia s/s and treatment. Emphasized importance of having glucose gel or juice on his bedside table in case he can't make it to "the refrigerator." Very nice man and was a pleasure to speak with.  Will follow glucose trends daily.  Thank you. Lorenda Peck, RD, LDN, CDE Inpatient Diabetes Coordinator [phone]

## 2020-06-29 NOTE — Evaluation (Signed)
Physical Therapy Evaluation Patient Details Name: Sean Saunders MRN: [id number] DOB: 01-17-1946 Today's Date: 06/29/2020   History of Present Illness  74 y.o. male with history of COPD, peripheral vascular disease, diabetes mellitus type 2, hypothyroidism gout and chronic kidney disease has been experiencing shortness of breath with wheezing for the last 2 weeks had gone to his primary care physician about 2 weeks ago when he was prescribed steroids and cough suppressants.  Pt admitted for Acute respiratory failure with hypoxia secondary to COPD exacerbation  Clinical Impression  Pt admitted with above diagnosis.  Pt currently with functional limitations due to the deficits listed below (see PT Problem List). Pt will benefit from skilled PT to increase their independence and safety with mobility to allow discharge to the venue listed below.  Pt ambulated in hallway and VERY unsteady requiring min assist for frequent LOB.  Pt declines assistive device stating he will use furniture and walls at home for stability.  Pt aware of recommendations from physical therapy however may decline need.     Follow Up Recommendations Home health PT;Supervision/Assistance - 24 hour    Equipment Recommendations  Rolling walker with 5" wheels    Recommendations for Other Services       Precautions / Restrictions Precautions Precautions: Fall Precaution Comments: monitor sats      Mobility  Bed Mobility Overal bed mobility: Needs Assistance Bed Mobility: Supine to Sit;Sit to Supine     Supine to sit: Supervision Sit to supine: Supervision      Transfers Overall transfer level: Needs assistance Equipment used: None Transfers: Sit to/from Stand Sit to Stand: Min guard         General transfer comment: min/guard for safety  Ambulation/Gait Ambulation/Gait assistance: Min assist Gait Distance (Feet): 240 Feet Assistive device: None Gait Pattern/deviations: Step-through pattern;Decreased  stride length;Drifts right/left;Narrow base of support     General Gait Details: solid min assist for frequent LOB, pt declines assistive device, politely states he is "stubborn", SPO2 96% on room air, however reapplied 2L O2 [place] upon return to room  Stairs            Wheelchair Mobility    Modified Rankin (Stroke Patients Only)       Balance Overall balance assessment: Needs assistance         Standing balance support: No upper extremity supported Standing balance-Leahy Scale: Fair Standing balance comment: static fair, otherwise poor                             Pertinent Vitals/Pain Pain Assessment: 0-10 Pain Score: 3  Pain Location: left chest Pain Descriptors / Indicators: Aching Pain Intervention(s): Repositioned;Monitored during session;Other (comment) (RN in airborne room, left note with Network engineer)    Home Living Family/patient expects to be discharged to:: Private residence Living Arrangements: Children;Other relatives   Type of Home: House Home Access: Stairs to enter Entrance Stairs-Rails: None Entrance Stairs-Number of Steps: 1 Home Layout: One level Home Equipment: Walker - 2 wheels      Prior Function Level of Independence: Independent               Hand Dominance        Extremity/Trunk Assessment        Lower Extremity Assessment Lower Extremity Assessment: Generalized weakness;RLE deficits/detail;LLE deficits/detail RLE Sensation: history of peripheral neuropathy LLE Sensation: history of peripheral neuropathy       Communication   Communication: No difficulties  Cognition Arousal/Alertness: Awake/alert Behavior During Therapy: WFL for tasks assessed/performed Overall Cognitive Status: Within Functional Limits for tasks assessed                                        General Comments General comments (skin integrity, edema, etc.): pt denies hx of falls however very unsteady with ambulation  today    Exercises     Assessment/Plan    PT Assessment Patient needs continued PT services  PT Problem List Decreased strength;Decreased mobility;Decreased activity tolerance;Decreased balance;Decreased knowledge of use of DME;Cardiopulmonary status limiting activity       PT Treatment Interventions DME instruction;Therapeutic exercise;Gait training;Balance training;Stair training;Functional mobility training;Therapeutic activities;Patient/family education    PT Goals (Current goals can be found in the Care Plan section)  Acute Rehab PT Goals Patient Stated Goal: home PT Goal Formulation: With patient/family Time For Goal Achievement: 07/06/20 Potential to Achieve Goals: Good    Frequency Min 3X/week   Barriers to discharge        Co-evaluation               AM-PAC PT "6 Clicks" Mobility  Outcome Measure Help needed turning from your back to your side while in a flat bed without using bedrails?: None Help needed moving from lying on your back to sitting on the side of a flat bed without using bedrails?: None Help needed moving to and from a bed to a chair (including a wheelchair)?: A Little Help needed standing up from a chair using your arms (e.g., wheelchair or bedside chair)?: A Little Help needed to walk in hospital room?: A Little Help needed climbing 3-5 steps with a railing? : A Little 6 Click Score: 20    End of Session Equipment Utilized During Treatment: Gait belt Activity Tolerance: Patient tolerated treatment well Patient left: in bed;with call bell/phone within reach;with family/visitor present   PT Visit Diagnosis: Other abnormalities of gait and mobility (R26.89);Unsteadiness on feet (R26.81)    Time: 0388-8280 PT Time Calculation (min) (ACUTE ONLY): 20 min   Charges:   PT Evaluation $PT Eval Low Complexity: 1 Low     Kati PT, DPT Acute Rehabilitation Services Pager: [phone] Office: [phone]   Trena Platt 06/29/2020,  4:38 PM

## 2020-06-29 NOTE — Progress Notes (Addendum)
PROGRESS NOTE    TRUMAN ACEITUNO  ONG:[id number] DOB: 1945-12-24 DOA: 06/26/2020 PCP: [place], Betty G, MD   Chief Complaint  Patient presents with  . Shortness of Breath   Brief Narrative:  EDITH LORD is Chenika Nevils 74 y.o. male with history of COPD, peripheral vascular disease, diabetes mellitus type 2, hypothyroidism gout and chronic kidney disease has been experiencing shortness of breath with wheezing for the last 2 weeks had gone to his primary care physician about 2 weeks ago when he was prescribed steroids and cough suppressants.  Despite which his symptoms did not improve and he went to [place] where patient had CT of the sinuses and per the patient did not show anything acute and was prescribed for the cough suppressants.  Patient shortness of breath continued to worsen.  Has been having some productive cough wheezing.  Denies any chest pain nausea vomiting abdominal pain or diarrhea.  Had had Covid vaccination.  ED Course: In the ER patient is febrile with temperature of 101.2 F and on exam patient is wheezing bilaterally with chest x-ray showing nothing acute.  Labs showing creatinine 1.2 hemoglobin 9.2 blood glucose of 202.  EKG shows sinus tachycardia.  Patient admitted for acute respiratory failure hypoxia secondary to COPD exacerbation with fever.  UA is pending.  Covid test was negative.  Assessment & Plan:   Principal Problem:   Acute respiratory failure with hypoxia (HCC) Active Problems:   Peripheral vascular disease, unspecified (HCC)   Type 2 diabetes mellitus with diabetic neuropathy, unspecified (HCC)   CKD (chronic kidney disease), stage III   Vestibular schwannoma (HCC)   COPD with acute exacerbation (HCC)   COPD exacerbation ([place])  1. Acute respiratory failure with hypoxia secondary to COPD exacerbation 1. CT chest without acute abnormality 2. Consistent with COPD exacerbation - exacerbated with activity with fits of coughing, upper airway sounds  3. Continue steroids,  antibiotics, scheduled and prn nebs - consider increasing steroid dose, but will try to keep as low as possible with uncontrolled BG's 4. Ativan prn for anxiety 5. Negative urine strep, negative legionella 6. Negative RVP, negative covid 7. IS, flutter, OOB, resp therapy and pt 8. Will consult pulm with lack of improvement since admission   2. Fever from bronchitis 1. RVP negative 2. Urine strep negative, negative legionella, negative covid 3. Follow blood and urine cultures no growth  3. Diabetes mellitus type 2, uncontrolled with hyperglycemia and hypoglycemia 1. Follow a1c 8.2 2. He's on u500 insulin at home, but takes this 4 times daily with sliding scale that he manages (says he often takes 140-180 during the day with meals and is more careful with dosing at night) 3. BG's uncontrolled here (hyperglycemia and hypoglycemia) - continue u500 insulin and q4 SSI - adjust as needed (80 u500 TID with hypoglycemia again this AM - will discuss with pharmacy and c/s diabetes coordinator) - will be difficult with steroids  4. History of peripheral vascular disease status post femoropopliteal bypass on Plavix and statin.  5. AKI on Chronic kidney disease stage III  1. hold lasix and lisinopril 2. Improved, continue to monitor   6. Chronic anemia, normocytic 1. Labs suggestive of AOCD, normal b12, folate, iron studies  7. Hypothyroidism on Synthroid.  8. History of gout on allopurinol.  9. Hypertension 1. Continue hydralazine, amlodipine 2. Holding lasix, lisinopril   10. Elevated troponin  1. Continues to be asymptomatic, trop peaked to 139 - suspect demand ischemia  11. History of schwannoma.  DVT prophylaxis: lovenox Code Status: DNR  Family Communication: daughter at bedside Disposition:   Status is: Inpatient  Remains inpatient appropriate because:Inpatient level of care appropriate due to severity of illness   Dispo: The patient is from: Home              Anticipated  d/c is to: Home              Anticipated d/c date is: > 3 days              Patient currently is not medically stable to d/c. Consultants:   none  Procedures:   none  Antimicrobials:  Anti-infectives (From admission, onward)   Start     Dose/Rate Route Frequency Ordered Stop   06/27/20 0430  cefTRIAXone (ROCEPHIN) 2 g in sodium chloride 0.9 % 100 mL IVPB     Discontinue     2 g 200 mL/hr over 30 Minutes Intravenous Daily 06/27/20 0401 07/02/20 0959   06/27/20 0430  azithromycin (ZITHROMAX) 500 mg in sodium chloride 0.9 % 250 mL IVPB     Discontinue     500 mg 250 mL/hr over 60 Minutes Intravenous Daily 06/27/20 0401 07/02/20 0959   06/26/20 2245  doxycycline (VIBRAMYCIN) 100 mg in sodium chloride 0.9 % 250 mL IVPB        100 mg 125 mL/hr over 120 Minutes Intravenous  Once 06/26/20 2244 06/27/20 0225     Subjective: Feels overall about the same  Objective: Vitals:   06/29/20 0110 06/29/20 0446 06/29/20 0506 06/29/20 0731  BP:  139/86    Pulse:  84    Resp:  20    Temp:  97.8 F (36.6 C)    TempSrc:  Oral    SpO2: 94% 95%  98%  Weight:   106.5 kg   Height:        Intake/Output Summary (Last 24 hours) at 06/29/2020 0804 Last data filed at 06/28/2020 2124 Gross per 24 hour  Intake 1119.29 ml  Output 1975 ml  Net -855.71 ml   Filed Weights   06/27/20 1955 06/29/20 0506  Weight: 107 kg 106.5 kg    Examination:  General: No acute distress. Cardiovascular: Heart sounds show Alma Mohiuddin regular rate, and rhythm Lungs: bilateral coarse breath sounds with wheezes, upper airway sounds Abdomen: Soft, nontender, nondistended Neurological: Alert and oriented 3. Moves all extremities 4 . Cranial nerves II through XII grossly intact. Skin: Warm and dry. No rashes or lesions. Extremities: No clubbing or cyanosis. No edema.   Data Reviewed: I have personally reviewed following labs and imaging studies  CBC: Recent Labs  Lab 06/26/20 2158 06/27/20 0509 06/28/20 0522  06/29/20 0519  WBC 9.4 6.4 11.6* 13.4*  NEUTROABS 6.5  --  8.3* 11.1*  HGB 11.2* 9.9* 10.2* 11.0*  HCT 34.7* 31.2* 31.1* 34.4*  MCV 88.1 91.2 88.6 89.1  PLT 191 161 191 536    Basic Metabolic Panel: Recent Labs  Lab 06/26/20 2158 06/27/20 0509 06/28/20 0522 06/29/20 0519  NA 137 131* 141 140  K 4.3 3.9 4.4 5.0  CL 105 100 107 107  CO2 21* 19* 21* 24  GLUCOSE 202* 484* 111* 76  BUN 21 24* 28* 34*  CREATININE 1.25* 1.61* 1.18 1.39*  CALCIUM 8.8* 8.2* 9.3 9.3  MG  --   --  2.2 2.3  PHOS  --   --  3.7 4.2    GFR: Estimated Creatinine Clearance: 57.9 mL/min (Cotina Freedman) (by C-G formula based on SCr  of 1.39 mg/dL (H)).  Liver Function Tests: Recent Labs  Lab 06/26/20 2158 06/27/20 0509 06/28/20 0522 06/29/20 0519  AST 23 22 22 29   ALT 28 26 28  36  ALKPHOS 70 61 62 73  BILITOT 0.7 0.5 0.3 0.3  PROT 6.3* 5.3* 5.6* 6.6  ALBUMIN 3.4* 3.0* 3.1* 3.5    CBG: Recent Labs  Lab 06/28/20 1604 06/28/20 2122 06/29/20 0011 06/29/20 0448 06/29/20 0801  GLUCAP 426* 403* 341* 117* 49*     Recent Results (from the past 240 hour(s))  SARS Coronavirus 2 by RT PCR (hospital order, performed in [facility] hospital lab) Nasopharyngeal Nasopharyngeal Swab     Status: None   Collection Time: 06/26/20 11:33 PM   Specimen: Nasopharyngeal Swab  Result Value Ref Range Status   SARS Coronavirus 2 NEGATIVE NEGATIVE Final    Comment: (NOTE) SARS-CoV-2 target nucleic acids are NOT DETECTED.  The SARS-CoV-2 RNA is generally detectable in upper and lower respiratory specimens during the acute phase of infection. The lowest concentration of SARS-CoV-2 viral copies this assay can detect is 250 copies / mL. Karyl Sharrar negative result does not preclude SARS-CoV-2 infection and should not be used as the sole basis for treatment or other patient management decisions.  Brentlee Sciara negative result may occur with improper specimen collection / handling, submission of specimen other than nasopharyngeal swab, presence  of viral mutation(s) within the areas targeted by this assay, and inadequate number of viral copies (<250 copies / mL). Hansel Devan negative result must be combined with clinical observations, patient history, and epidemiological information.  Fact Sheet for Patients:   StrictlyIdeas.no  Fact Sheet for Healthcare Providers: BankingDealers.co.za  This test is not yet approved or  cleared by the [place] FDA and has been authorized for detection and/or diagnosis of SARS-CoV-2 by FDA under an Emergency Use Authorization (EUA).  This EUA will remain in effect (meaning this test can be used) for the duration of the COVID-19 declaration under Section 564(b)(1) of the Act, 21 U.S.C. section 360bbb-3(b)(1), unless the authorization is terminated or revoked sooner.  Performed at [facility], [place] [address] [place], [place] [address]   Blood culture (routine x 2)     Status: None (Preliminary result)   Collection Time: 06/26/20 11:33 PM   Specimen: BLOOD  Result Value Ref Range Status   Specimen Description   Final    BLOOD LEFT ANTECUBITAL Performed at [facility] [address] [place], [place] [address]    Special Requests   Final    BOTTLES DRAWN AEROBIC AND ANAEROBIC Blood Culture adequate volume Performed at [facility] [address] [place], [place] [address]    Culture   Final    NO GROWTH 1 DAY Performed at [facility], [place] [address] [place], [place] [address]    Report Status PENDING  Incomplete  Urine culture     Status: None   Collection Time: 06/27/20  5:47 AM   Specimen: Urine, Random  Result Value Ref Range Status   Specimen Description   Final    URINE, RANDOM Performed at [facility] [address], [place], [place] [address]    Special Requests   Final    NONE Performed at [facility], [place] [address] [place], [place] [address]    Culture   Final    NO GROWTH Performed at [facility], [place] [address], [place], [place] [address]  Report Status 06/28/2020 FINAL  Final  Blood culture (routine x 2)     Status: None (Preliminary result)   Collection Time: 06/27/20  8:00 AM   Specimen: BLOOD  Result Value Ref Range Status   Specimen Description   Final    BLOOD RIGHT ANTECUBITAL Performed at [facility] [address], [place], [place] [address]    Special Requests   Final    BOTTLES DRAWN AEROBIC AND ANAEROBIC Blood Culture results may not be optimal due to an inadequate volume of blood received in culture bottles Performed at [facility] [address] [place], [place] [address]    Culture   Final    NO GROWTH 1 DAY Performed at [facility], [place] [address] [place], [place] [address]    Report Status PENDING  Incomplete  Respiratory Panel by PCR     Status: None   Collection Time: 06/27/20  8:00 AM   Specimen: Nasopharyngeal Swab; Respiratory  Result Value Ref Range Status   Adenovirus NOT DETECTED NOT DETECTED Final   Coronavirus 229E NOT DETECTED NOT DETECTED Final    Comment: (NOTE) The Coronavirus on the Respiratory Panel, DOES NOT test for the novel  Coronavirus (2019 nCoV)    Coronavirus HKU1 NOT DETECTED NOT DETECTED Final   Coronavirus NL63 NOT DETECTED NOT DETECTED Final   Coronavirus OC43 NOT DETECTED NOT DETECTED Final   Metapneumovirus NOT DETECTED NOT DETECTED Final   Rhinovirus / Enterovirus NOT DETECTED NOT DETECTED Final   Influenza Nainika Newlun NOT DETECTED NOT DETECTED Final   Influenza B NOT DETECTED NOT DETECTED Final   Parainfluenza Virus 1 NOT DETECTED NOT DETECTED Final   Parainfluenza Virus 2 NOT DETECTED NOT DETECTED Final   Parainfluenza Virus 3 NOT DETECTED NOT DETECTED Final   Parainfluenza Virus 4 NOT DETECTED NOT DETECTED Final   Respiratory Syncytial Virus NOT DETECTED NOT DETECTED Final    Bordetella pertussis NOT DETECTED NOT DETECTED Final   Chlamydophila pneumoniae NOT DETECTED NOT DETECTED Final   Mycoplasma pneumoniae NOT DETECTED NOT DETECTED Final    Comment: Performed at [facility], [place]. [address] [place], [place] [address]         Radiology Studies: CT CHEST WO CONTRAST  Result Date: 06/27/2020 CLINICAL DATA:  Cough and shortness of breath for the past 3 weeks. EXAM: CT CHEST WITHOUT CONTRAST TECHNIQUE: Multidetector CT imaging of the chest was performed following the standard protocol without IV contrast. COMPARISON:  Chest radiographs obtained yesterday. FINDINGS: Cardiovascular: Atheromatous calcifications, including the coronary arteries and aorta. Minimal pericardial effusion with Arthurine Oleary maximum thickness of 8 mm. Mediastinum/Nodes: No enlarged mediastinal or axillary lymph nodes. Thyroid gland, trachea, and esophagus demonstrate no significant findings. Lungs/Pleura: Mild bilateral bullous changes in Tam Savoia centrilobular distribution. No airspace consolidation or pleural fluid. Upper Abdomen: Multiple small gallstones in the gallbladder measuring up to 7 mm in maximum diameter each. No gallbladder wall thickening or pericholecystic fluid. Musculoskeletal: Thoracic and lower cervical spine degenerative changes. IMPRESSION: 1. No acute abnormality. 2. Mild changes of COPD. 3. Minimal pericardial effusion. 4. Cholelithiasis. 5.  Calcific coronary artery and aortic atherosclerosis. Aortic Atherosclerosis (ICD10-I70.0) and Emphysema (ICD10-J43.9). Electronically Signed   By: Claudie Revering M.D.   On: 06/27/2020 14:26   DG CHEST PORT 1 VIEW  Result Date: 06/28/2020 CLINICAL DATA:  Acute respiratory failure in Terrilyn Tyner patient with Shakeira Rhee history of COPD. Chest pain and shortness of breath. EXAM: PORTABLE CHEST 1 VIEW COMPARISON:  PA and  lateral chest 06/26/2020.  CT chest 06/27/2020. FINDINGS: The lungs are emphysematous but clear. Heart size is normal. Aortic atherosclerosis. No  pneumothorax or pleural fluid. No acute or focal bony abnormality. IMPRESSION: No acute disease. Aortic Atherosclerosis (ICD10-I70.0) and Emphysema (ICD10-J43.9). Electronically Signed   By: Inge Rise M.D.   On: 06/28/2020 17:37        Scheduled Meds: . allopurinol  100 mg Oral BID  . amLODipine  10 mg Oral Daily  . budesonide (PULMICORT) nebulizer solution  0.25 mg Nebulization BID  . clopidogrel  75 mg Oral Daily  . enoxaparin (LOVENOX) injection  40 mg Subcutaneous Q24H  . fluticasone  2 spray Each Nare Daily  . hydrALAZINE  25 mg Oral TID  . insulin aspart  0-20 Units Subcutaneous Q4H  . insulin regular human CONCENTRATED  80 Units Subcutaneous TID WC  . ipratropium-albuterol  3 mL Nebulization Q6H  . levothyroxine  150 mcg Oral Q0600  . methylPREDNISolone (SOLU-MEDROL) injection  40 mg Intravenous Daily  . multivitamin with minerals  1 tablet Oral Daily  . rOPINIRole  0.25 mg Oral QHS  . rosuvastatin  20 mg Oral Daily  . tamsulosin  0.4 mg Oral QPC supper   Continuous Infusions: . azithromycin 500 mg (06/28/20 1007)  . cefTRIAXone (ROCEPHIN)  IV 2 g (06/28/20 0928)     LOS: 2 days    Time spent: over 30 min    Fayrene Helper, MD [facility]   To contact the attending provider between 7A-7P or the covering provider during after hours 7P-7A, please log into the web site [email or web address] and access using universal  password for that web site. If you do not have the password, please call the hospital operator.  06/29/2020, 8:04 AM

## 2020-06-30 DIAGNOSIS — J441 Chronic obstructive pulmonary disease with (acute) exacerbation: Principal | ICD-10-CM

## 2020-06-30 LAB — GLUCOSE, CAPILLARY
Glucose-Capillary: 172 mg/dL — ABNORMAL HIGH (ref 70–99)
Glucose-Capillary: 189 mg/dL — ABNORMAL HIGH (ref 70–99)
Glucose-Capillary: 207 mg/dL — ABNORMAL HIGH (ref 70–99)
Glucose-Capillary: 317 mg/dL — ABNORMAL HIGH (ref 70–99)
Glucose-Capillary: 32 mg/dL — CL (ref 70–99)
Glucose-Capillary: 358 mg/dL — ABNORMAL HIGH (ref 70–99)
Glucose-Capillary: 380 mg/dL — ABNORMAL HIGH (ref 70–99)
Glucose-Capillary: 410 mg/dL — ABNORMAL HIGH (ref 70–99)
Glucose-Capillary: 80 mg/dL (ref 70–99)

## 2020-06-30 LAB — COMPREHENSIVE METABOLIC PANEL
ALT: 40 U/L (ref 0–44)
AST: 24 U/L (ref 15–41)
Albumin: 3.3 g/dL — ABNORMAL LOW (ref 3.5–5.0)
Alkaline Phosphatase: 71 U/L (ref 38–126)
Anion gap: 11 (ref 5–15)
BUN: 30 mg/dL — ABNORMAL HIGH (ref 8–23)
CO2: 26 mmol/L (ref 22–32)
Calcium: 9.2 mg/dL (ref 8.9–10.3)
Chloride: 105 mmol/L (ref 98–111)
Creatinine, Ser: 1.08 mg/dL (ref 0.61–1.24)
GFR calc Af Amer: >60 mL/min (ref >60)
GFR calc non Af Amer: >60 mL/min (ref >60)
Glucose, Bld: 47 mg/dL — ABNORMAL LOW (ref 70–99)
Potassium: 4 mmol/L (ref 3.5–5.1)
Sodium: 142 mmol/L (ref 135–145)
Total Bilirubin: 0.4 mg/dL (ref 0.3–1.2)
Total Protein: 6 g/dL — ABNORMAL LOW (ref 6.5–8.1)

## 2020-06-30 LAB — CBC WITH DIFFERENTIAL/PLATELET
Abs Immature Granulocytes: 0.09 10*3/uL — ABNORMAL HIGH (ref 0.00–0.07)
Basophils Absolute: 0 10*3/uL (ref 0.0–0.1)
Basophils Relative: 0 %
Eosinophils Absolute: 0 10*3/uL (ref 0.0–0.5)
Eosinophils Relative: 0 %
HCT: 33.4 % — ABNORMAL LOW (ref 39.0–52.0)
Hemoglobin: 10.6 g/dL — ABNORMAL LOW (ref 13.0–17.0)
Immature Granulocytes: 1 %
Lymphocytes Relative: 19 %
Lymphs Abs: 2.6 10*3/uL (ref 0.7–4.0)
MCH: 28.4 pg (ref 26.0–34.0)
MCHC: 31.7 g/dL (ref 30.0–36.0)
MCV: 89.5 fL (ref 80.0–100.0)
Monocytes Absolute: 1.1 10*3/uL — ABNORMAL HIGH (ref 0.1–1.0)
Monocytes Relative: 8 %
Neutro Abs: 10.3 10*3/uL — ABNORMAL HIGH (ref 1.7–7.7)
Neutrophils Relative %: 72 %
Platelets: 245 10*3/uL (ref 150–400)
RBC: 3.73 MIL/uL — ABNORMAL LOW (ref 4.22–5.81)
RDW: 14.6 % (ref 11.5–15.5)
WBC: 14.1 10*3/uL — ABNORMAL HIGH (ref 4.0–10.5)
nRBC: 0 % (ref 0.0–0.2)

## 2020-06-30 LAB — PHOSPHORUS: Phosphorus: 4.2 mg/dL (ref 2.5–4.6)

## 2020-06-30 LAB — MAGNESIUM: Magnesium: 2.4 mg/dL (ref 1.7–2.4)

## 2020-06-30 MED ORDER — INSULIN ASPART 100 UNIT/ML ~~LOC~~ SOLN
0.0000 [IU] | Freq: Three times a day (TID) | SUBCUTANEOUS | Status: DC
  Administered 2020-06-30: 3 [IU] via SUBCUTANEOUS
  Administered 2020-06-30: 15 [IU] via SUBCUTANEOUS
  Administered 2020-07-01: 8 [IU] via SUBCUTANEOUS
  Administered 2020-07-01: 3 [IU] via SUBCUTANEOUS

## 2020-06-30 MED ORDER — SERTRALINE HCL 25 MG PO TABS
25.0000 mg | ORAL_TABLET | Freq: Every day | ORAL | Status: DC
  Administered 2020-06-30 – 2020-07-01 (×2): 25 mg via ORAL
  Filled 2020-06-30 (×2): qty 1

## 2020-06-30 MED ORDER — PREDNISONE 20 MG PO TABS
40.0000 mg | ORAL_TABLET | Freq: Every day | ORAL | Status: DC
  Administered 2020-07-01: 40 mg via ORAL
  Filled 2020-06-30: qty 2

## 2020-06-30 MED ORDER — DEXTROSE 50 % IV SOLN
INTRAVENOUS | Status: AC
  Filled 2020-06-30: qty 50

## 2020-06-30 MED ORDER — INSULIN REGULAR HUMAN (CONC) 500 UNIT/ML ~~LOC~~ SOPN
65.0000 [IU] | PEN_INJECTOR | Freq: Three times a day (TID) | SUBCUTANEOUS | Status: DC
  Administered 2020-06-30 – 2020-07-01 (×4): 65 [IU] via SUBCUTANEOUS

## 2020-06-30 MED ORDER — INSULIN ASPART 100 UNIT/ML ~~LOC~~ SOLN
0.0000 [IU] | Freq: Every day | SUBCUTANEOUS | Status: DC
  Administered 2020-06-30: 5 [IU] via SUBCUTANEOUS

## 2020-06-30 NOTE — Evaluation (Signed)
Occupational Therapy Evaluation Patient Details Name: Sean Saunders MRN: [id number] DOB: 10-May-1946 Today's Date: 06/30/2020    History of Present Illness 74 y.o. male with history of COPD, peripheral vascular disease, diabetes mellitus type 2, hypothyroidism gout and chronic kidney disease has been experiencing shortness of breath with wheezing for the last 2 weeks had gone to his primary care physician about 2 weeks ago when he was prescribed steroids and cough suppressants.  Pt admitted for Acute respiratory failure with hypoxia secondary to COPD exacerbation   Clinical Impression   Patient with functional deficits listed below impacting ability to participate in self care safely. Initiate patient education regarding use of AE and additional energy conservation strategies to implement at home. Patient reports main difficulty is bending over "I can't breath then" and ambulating beyond household distances to get mail or get to car. Educate patient on pursed lip breathing strategies during labored breathing/feeling SOB. Patient primarily supervision level with min guard assist upon initial stand due to posterior lean. Recommend [place] services to assist patient with home modifications to maximize safety and activity tolerance needed to complete daily routine.     Follow Up Recommendations  Home health OT;Supervision - Intermittent    Equipment Recommendations  Tub/shower seat;Other (comment) (reacher)       Precautions / Restrictions Precautions Precautions: Fall Precaution Comments: monitor sats Restrictions Weight Bearing Restrictions: No      Mobility Bed Mobility Overal bed mobility: Needs Assistance Bed Mobility: Supine to Sit     Supine to sit: Supervision        Transfers Overall transfer level: Needs assistance Equipment used: None Transfers: Sit to/from Stand Sit to Stand: Min guard;Supervision         General transfer comment: initial posterior lean with standing  requiring min G, then progress to supervision    Balance Overall balance assessment: Needs assistance Sitting-balance support: Feet supported Sitting balance-Leahy Scale: Good     Standing balance support: Single extremity supported;During functional activity Standing balance-Leahy Scale: Fair                             ADL either performed or assessed with clinical judgement   ADL Overall ADL's : Needs assistance/impaired     Grooming: Supervision/safety;Sitting;Standing   Upper Body Bathing: Set up;Sitting   Lower Body Bathing: Supervison/ safety;Sit to/from stand;Sitting/lateral leans   Upper Body Dressing : Set up;Sitting   Lower Body Dressing: Supervision/safety;Sitting/lateral leans;Sit to/from stand;With adaptive equipment Lower Body Dressing Details (indicate cue type and reason): patient trial use of reacher and sock aid as he states bending over limits his ability to breath. patient also demos figure 4 method but is agreeable to reacher for home  Toilet Transfer: Ambulation;Min Radio broadcast assistant Details (indicate cue type and reason): simulated with functional mobility; upon initial stand patient with posterior lean requiring min G assist, progresses to supervision level Toileting- Clothing Manipulation and Hygiene: Supervision/safety;Sit to/from stand;Sitting/lateral lean       Functional mobility during ADLs: Supervision/safety;Min guard;Cueing for safety General ADL Comments: educate patient on compensatory strategies and energy conservation techiques for home as patient states his big issues are when he has to bend over such as cleaning cat box. recommend placing cat box on slightly higher surface and getting long handle cat scooper      Vision Baseline Vision/History: Wears glasses Wears Glasses: Reading only              Pertinent  Vitals/Pain Pain Assessment: Faces Faces Pain Scale: Hurts a little bit Pain Location:  chest Pain Descriptors / Indicators: Sore Pain Intervention(s): Monitored during session     Hand Dominance Right   Extremity/Trunk Assessment Upper Extremity Assessment Upper Extremity Assessment: Overall WFL for tasks assessed   Lower Extremity Assessment Lower Extremity Assessment: Defer to PT evaluation   Cervical / Trunk Assessment Cervical / Trunk Assessment: Normal   Communication Communication Communication: No difficulties   Cognition Arousal/Alertness: Awake/alert Behavior During Therapy: WFL for tasks assessed/performed Overall Cognitive Status: Within Functional Limits for tasks assessed                                 General Comments: tangential   General Comments  patient on room air for approx 10 mins during LB dressing, functional ambulation in room with sats maintained 94-98%            Home Living Family/patient expects to be discharged to:: Private residence Living Arrangements: Children Available Help at Discharge: Family;Available PRN/intermittently Type of Home: House Home Access: Stairs to enter [employer] of Steps: 1 Entrance Stairs-Rails: None Home Layout: One level     Bathroom Shower/Tub: Teacher, early years/pre: Handicapped height     Home Equipment: Environmental consultant - 2 wheels   Additional Comments: patient reports children are moving out middle of next month and will be home alone with his cats      Prior Functioning/Environment Level of Independence: Independent        Comments: patient reports ambulating to mailbox or to get to chair is challenging "I run out of steam," also limits what he does outside if 80 degrees or hotter outside        OT Problem List: Decreased activity tolerance;Impaired balance (sitting and/or standing);Decreased knowledge of use of DME or AE;Obesity;Cardiopulmonary status limiting activity      OT Treatment/Interventions: Self-care/ADL training;Energy  conservation;DME and/or AE instruction;Patient/family education;Balance training;Therapeutic activities    OT Goals(Current goals can be found in the care plan section) Acute Rehab OT Goals Patient Stated Goal: "Go home with some air" referring to supplemental oxygen OT Goal Formulation: With patient Time For Goal Achievement: 07/14/20 Potential to Achieve Goals: Good  OT Frequency: Min 2X/week    AM-PAC OT "6 Clicks" Daily Activity     Outcome Measure Help from another person eating meals?: None Help from another person taking care of personal grooming?: A Little Help from another person toileting, which includes using toliet, bedpan, or urinal?: A Little Help from another person bathing (including washing, rinsing, drying)?: A Little Help from another person to put on and taking off regular upper body clothing?: A Little Help from another person to put on and taking off regular lower body clothing?: A Little 6 Click Score: 19   End of Session Equipment Utilized During Treatment: Oxygen Nurse Communication: Mobility status  Activity Tolerance: Patient tolerated treatment well Patient left: in bed;with call bell/phone within reach;with bed alarm set;with family/visitor present  OT Visit Diagnosis: Other abnormalities of gait and mobility (R26.89);Pain Pain - part of body:  (chest)                Time: 3428-7681 OT Time Calculation (min): 45 min Charges:  OT General Charges $OT Visit: 1 Visit OT Evaluation $OT Eval Moderate Complexity: 1 Mod OT Treatments $Self Care/Home Management : 23-37 mins  Delbert Phenix OT Pager: [place]  06/30/2020, 3:01 PM

## 2020-06-30 NOTE — Consult Note (Signed)
NAME:  Sean Saunders, MRN:  [id number], DOB:  1946/09/25, LOS: 3 ADMISSION DATE:  06/26/2020, CONSULTATION DATE: 7/19 REFERRING MD: Dr. Florene Glen / [facility], CHIEF COMPLAINT: Shortness of Breath    Brief History   74 y/o M, ongoing smoker, with COPD admitted 7/16 with 3 weeks of shortness of breath, sinus pressure and decreased activity tolerance.    The patient reports he has had at least one month of shortness, sinus pressure and decreased activity tolerance.  He reports he can barely walk 50 feet. He was seen at his PCP office approximately 2 weeks before admit and was treated with steroids and a cough suppressant.  He did not improve and went to the [place] where he had a CT of the sinuses which he states "I was full up here pointing to his sinuses but nothing in my chest".  He was given cough suppressants.  He continued to have shortness of breath.    On 7/16 he reported to the ER for further evaluation.  On presentation, he was noted to be febrile to 101.2, hgb 9.2, sr cr 1.2, and glucose of 202.  COVID testing negative.  Urine strep and legionella negative. The patient reports he has completed vaccination.  The patient was admitted with the working diagnosis of a COPD exacerbation and treated with IV steroids, Q6 duoneb, BID pulmicort, cough suppression and empiric IV antibiotics.    He is tearful on exam and states he is "ready to go", reports chest pain, shortness of breath and anxiousness.  His family reports he called hospice before admission. He reports lower extremity, hip pain and shortness of breath as limiting factors for activity.    PCCM consulted for evaluation.   Past Medical History  TIA - multiple in 2012 with residual left sided weakness HTN HLD DVT  COPD - PFT 05/2018 with mixed restriction and obstruction, severe airflow limitation, FVC 2.89 / 61%, FEV1 1.7 / 49%, FEV1 / FVC ratio 59. DM  Hypothyroidism  GERD  PVD s/p Femoral-Popliteal Bypass Graft   Significant Hospital  Events   7/16 Admit  7/19 PCCM consulted  Consults:    Procedures:    Significant Diagnostic Tests:  CT Chest w/o 7/17 >> no acute abnormality, mild changes of COPD, minimal pericardial effusion, cholelithiasis, calcific coronary artery and aortic athererosclerosis  Micro Data:  COVID 7/16 >> negative  RVP 7/17 >> negative  UC 7/17 >> negative  U. Strep Antige 7/17 >> negative  U. Legionella 7/17 >> negative  BCx2 7/17 >>   Antimicrobials:  Azithromycin 7/17 >> Ceftriaxone 7/17 >>  Interim history/subjective:  Afebrile Pt reports there is nothing left to do and he is ready to die.  Denies suicidal ideations.  States he can not do the same things he used to enjoy.    Objective   Blood pressure (!) 177/76, pulse 80, temperature (!) 97.4 F (36.3 C), temperature source Oral, resp. rate 16, height 6' (1.829 m), weight 107.8 kg, SpO2 96 %.        Intake/Output Summary (Last 24 hours) at 06/30/2020 1422 Last data filed at 06/30/2020 0900 Gross per 24 hour  Intake 240 ml  Output 2650 ml  Net -2410 ml   Filed Weights   06/27/20 1955 06/29/20 0506 06/30/20 0500  Weight: 107 kg 106.5 kg 107.8 kg    Examination: General: adult male sitting in bed in NAD HEENT: MM pink/moist, [place] O2 PSY: again tearful during interview Neuro: AAOx4, speech clear, MAE CV: s1s2 rrr,  no m/r/g PULM: non-labored on RA (has nasal cannula off), forced exp wheeze when asked to take deep breath, otherwise work of breathing normal  GI: soft, bsx4 active  Extremities: warm/dry, no edema  Skin: no rashes or lesions  Resolved Hospital Problem list      Assessment & Plan:   Dyspnea  Acute Exacerbation of Obstructive Lung Disease / COPD  Suspected Sinusitis Acute Hypoxic Respiratory Failure  Emphysema noted on CT, no acute infiltrate or effusions. COVID / RVP negative, legionella, strep negative.  -continue brovana, pulmicort, yupelri while inpatient  -taper steroids to off  -assess ambulatory  O2 needs prior to discharge  -continue flonase, nasal saline at discharge -abx per primary  -DNR / DNI  -hospital follow up arranged, see discharge section (Tammy Parrett, NP 8/5 at 2pm) -discharge medication recommendation > add advair, spiriva (or similar that formulary will cover)  Chest Pain  -suspect related to anxiety, troponin negative, EKG wnl   Anxiety / Suspected Depression  -add zoloft  -PSY consulted for evaluation for depression / further recommendations     Best practice:  Diet: per primary  DVT prophylaxis: lovenox  GI prophylaxis: n/a Glucose control: per primary  Mobility: as tolerated  Code Status: DNR Family Communication: Patient & daughter updated at bedside 7/20.   Disposition: per primary   PCCM will be available PRN.  Please call back if new needs arise.     Labs   CBC: Recent Labs  Lab 06/26/20 2158 06/27/20 0509 06/28/20 0522 06/29/20 0519 06/30/20 0410  WBC 9.4 6.4 11.6* 13.4* 14.1*  NEUTROABS 6.5  --  8.3* 11.1* 10.3*  HGB 11.2* 9.9* 10.2* 11.0* 10.6*  HCT 34.7* 31.2* 31.1* 34.4* 33.4*  MCV 88.1 91.2 88.6 89.1 89.5  PLT 191 161 191 237 194    Basic Metabolic Panel: Recent Labs  Lab 06/26/20 2158 06/27/20 0509 06/28/20 0522 06/29/20 0519 06/30/20 0410  NA 137 131* 141 140 142  K 4.3 3.9 4.4 5.0 4.0  CL 105 100 107 107 105  CO2 21* 19* 21* 24 26  GLUCOSE 202* 484* 111* 76 47*  BUN 21 24* 28* 34* 30*  CREATININE 1.25* 1.61* 1.18 1.39* 1.08  CALCIUM 8.8* 8.2* 9.3 9.3 9.2  MG  --   --  2.2 2.3 2.4  PHOS  --   --  3.7 4.2 4.2   GFR: Estimated Creatinine Clearance: 75 mL/min (by C-G formula based on SCr of 1.08 mg/dL). Recent Labs  Lab 06/26/20 2158 06/26/20 2246 06/27/20 0509 06/28/20 0522 06/29/20 0519 06/30/20 0410  PROCALCITON  --   --  <0.10  --   --   --   WBC   < >  --  6.4 11.6* 13.4* 14.1*  LATICACIDVEN  --  1.3  --   --   --   --    < > = values in this interval not displayed.    Liver Function Tests:  Recent Labs  Lab 06/26/20 2158 06/27/20 0509 06/28/20 0522 06/29/20 0519 06/30/20 0410  AST 23 22 22 29 24   ALT 28 26 28  36 40  ALKPHOS 70 61 62 73 71  BILITOT 0.7 0.5 0.3 0.3 0.4  PROT 6.3* 5.3* 5.6* 6.6 6.0*  ALBUMIN 3.4* 3.0* 3.1* 3.5 3.3*   No results for input(s): LIPASE, AMYLASE in the last 168 hours. No results for input(s): AMMONIA in the last 168 hours.  ABG    Component Value Date/Time   PHART 7.437 06/29/2020 0810  PCO2ART 37.3 06/29/2020 0810   PO2ART 81.9 (L) 06/29/2020 0810   HCO3 24.8 06/29/2020 0810   TCO2 21 06/04/2012 0831   O2SAT 96.3 06/29/2020 0810     Coagulation Profile: No results for input(s): INR, PROTIME in the last 168 hours.  Cardiac Enzymes: No results for input(s): CKTOTAL, CKMB, CKMBINDEX, TROPONINI in the last 168 hours.  HbA1C: Hgb A1c MFr Bld  Date/Time Value Ref Range Status  06/27/2020 08:56 AM 8.2 (H) 4.8 - 5.6 % Final    Comment:    (NOTE) Pre diabetes:          5.7%-6.4%  Diabetes:              >6.4%  Glycemic control for   <7.0% adults with diabetes   12/31/2019 05:18 PM 7.7 (H) 4.8 - 5.6 % Final    Comment:    (NOTE) Pre diabetes:          5.7%-6.4% Diabetes:              >6.4% Glycemic control for   <7.0% adults with diabetes     CBG: Recent Labs  Lab 06/30/20 0332 06/30/20 0737 06/30/20 0738 06/30/20 0817 06/30/20 1209  GLUCAP 80 35* 32* 172* 189*       Critical care time: n/a    Noe Gens, MSN, NP-C [facility] 06/30/2020, 2:22 PM   Please see [email or web address] for pager details.

## 2020-06-30 NOTE — Progress Notes (Signed)
PROGRESS NOTE    AJAX SCHROLL  ERD:[id number] DOB: 1946-06-20 DOA: 06/26/2020 PCP: [place], Betty G, MD   Chief Complaint  Patient presents with  . Shortness of Breath   Brief Narrative:  Sean Saunders is a 74 y.o. male with history of COPD, peripheral vascular disease, diabetes mellitus type 2, hypothyroidism gout and chronic kidney disease has been experiencing shortness of breath with wheezing for the last 2 weeks had gone to his primary care physician about 2 weeks ago when he was prescribed steroids and cough suppressants.  Despite which his symptoms did not improve and he went to [place] where patient had CT of the sinuses and per the patient did not show anything acute and was prescribed for the cough suppressants.  Patient shortness of breath continued to worsen.  Has been having some productive cough wheezing.  Denies any chest pain nausea vomiting abdominal pain or diarrhea.  Had had Covid vaccination.  ED Course: In the ER patient is febrile with temperature of 101.2 F and on exam patient is wheezing bilaterally with chest x-ray showing nothing acute.  Labs showing creatinine 1.2 hemoglobin 9.2 blood glucose of 202.  EKG shows sinus tachycardia.  Patient admitted for acute respiratory failure hypoxia secondary to COPD exacerbation with fever.  UA is pending.  Covid test was negative.  Assessment & Plan:   Principal Problem:   Acute respiratory failure with hypoxia (HCC) Active Problems:   Peripheral vascular disease, unspecified (HCC)   Type 2 diabetes mellitus with diabetic neuropathy, unspecified (HCC)   CKD (chronic kidney disease), stage III   Vestibular schwannoma (HCC)   COPD with acute exacerbation (HCC)   COPD exacerbation ([place])  1. Acute respiratory failure with hypoxia secondary to COPD exacerbation 1. CT chest without acute abnormality 2. Consistent with COPD exacerbation - exacerbated with activity with fits of coughing, upper airway sounds  3. Continue steroids,  antibiotics, scheduled and prn nebs  4. Ativan prn for anxiety 5. Negative urine strep, negative legionella 6. Negative RVP, negative covid 7. IS, flutter, OOB, resp therapy and pt 8. Will consult pulm with lack of improvement since admission - appreciate assistance, they've arranged hospital follow up, recommending adding advair/spiriva (or similar per formulary)  2. Fever from bronchitis 1. RVP negative 2. Urine strep negative, negative legionella, negative covid 3. Follow blood and urine cultures no growth 4. Continue abx x 5 days  3. Diabetes mellitus type 2, uncontrolled with hyperglycemia and hypoglycemia 1. Follow a1c 8.2 2. He's on u500 insulin at home, but takes this 4 times daily with sliding scale that he manages (says he often takes 140-180 during the day with meals and is more careful with dosing at night) 3. BG's uncontrolled here (hyperglycemia and hypoglycemia) - continue u500 insulin and SSI - adjust as needed (65 u500 TID with hypoglycemia again this AM - will discuss with pharmacy and c/s diabetes coordinator) - will be difficult with steroids  4. Concern for Depression  Anxiety: zoloft started by PCCM.  Continue low dose prn ativan.  Psychiatry c/s per PCCM.  5. History of peripheral vascular disease status post femoropopliteal bypass on Plavix and statin.  6. AKI on Chronic kidney disease stage III  1. hold lasix and lisinopril 2. Improved, continue to monitor   7. Chronic anemia, normocytic 1. Labs suggestive of AOCD, normal b12, folate, iron studies  8. Hypothyroidism on Synthroid.  9. History of gout on allopurinol.  10. Hypertension 1. Continue hydralazine, amlodipine 2. Holding lasix, lisinopril  11. Elevated troponin  1. Continues to be asymptomatic, trop peaked to 139 - suspect demand ischemia  12. History of schwannoma.  DVT prophylaxis: lovenox Code Status: DNR  Family Communication: daughter at bedside Disposition:   Status is: Inpatient   Remains inpatient appropriate because:Inpatient level of care appropriate due to severity of illness   Dispo: The patient is from: Home              Anticipated d/c is to: Home              Anticipated d/c date is: > 3 days              Patient currently is not medically stable to d/c. Consultants:   none  Procedures:   none  Antimicrobials:  Anti-infectives (From admission, onward)   Start     Dose/Rate Route Frequency Ordered Stop   06/27/20 0430  cefTRIAXone (ROCEPHIN) 2 g in sodium chloride 0.9 % 100 mL IVPB     Discontinue     2 g 200 mL/hr over 30 Minutes Intravenous Daily 06/27/20 0401 07/02/20 0959   06/27/20 0430  azithromycin (ZITHROMAX) 500 mg in sodium chloride 0.9 % 250 mL IVPB     Discontinue     500 mg 250 mL/hr over 60 Minutes Intravenous Daily 06/27/20 0401 07/02/20 0959   06/26/20 2245  doxycycline (VIBRAMYCIN) 100 mg in sodium chloride 0.9 % 250 mL IVPB        100 mg 125 mL/hr over 120 Minutes Intravenous  Once 06/26/20 2244 06/27/20 0225     Subjective: No significant improvement  Objective: Vitals:   06/30/20 0457 06/30/20 0500 06/30/20 0804 06/30/20 1349  BP: (!) 161/73   (!) 177/76  Pulse: 86   80  Resp: 17   16  Temp: 97.8 F (36.6 C)   (!) 97.4 F (36.3 C)  TempSrc: Oral   Oral  SpO2: 97%  97% 96%  Weight:  107.8 kg    Height:        Intake/Output Summary (Last 24 hours) at 06/30/2020 1653 Last data filed at 06/30/2020 0900 Gross per 24 hour  Intake 240 ml  Output 1850 ml  Net -1610 ml   Filed Weights   06/27/20 1955 06/29/20 0506 06/30/20 0500  Weight: 107 kg 106.5 kg 107.8 kg    Examination:  General: No acute distress. Cardiovascular: Heart sounds show a regular rate, and rhythm Lungs: occasional wheezes, upper airways sounds (pt notes he's feeling about the same, but actually looks a bit better) Abdomen: Soft, nontender, nondistended  Neurological: Alert and oriented 3. Moves all extremities 4 with equal strength.  Cranial nerves II through XII grossly intact. Skin: Warm and dry. No rashes or lesions. Extremities: No clubbing or cyanosis. No edema.   Data Reviewed: I have personally reviewed following labs and imaging studies  CBC: Recent Labs  Lab 06/26/20 2158 06/27/20 0509 06/28/20 0522 06/29/20 0519 06/30/20 0410  WBC 9.4 6.4 11.6* 13.4* 14.1*  NEUTROABS 6.5  --  8.3* 11.1* 10.3*  HGB 11.2* 9.9* 10.2* 11.0* 10.6*  HCT 34.7* 31.2* 31.1* 34.4* 33.4*  MCV 88.1 91.2 88.6 89.1 89.5  PLT 191 161 191 237 443    Basic Metabolic Panel: Recent Labs  Lab 06/26/20 2158 06/27/20 0509 06/28/20 0522 06/29/20 0519 06/30/20 0410  NA 137 131* 141 140 142  K 4.3 3.9 4.4 5.0 4.0  CL 105 100 107 107 105  CO2 21* 19* 21* 24 26  GLUCOSE 202*  484* 111* 76 47*  BUN 21 24* 28* 34* 30*  CREATININE 1.25* 1.61* 1.18 1.39* 1.08  CALCIUM 8.8* 8.2* 9.3 9.3 9.2  MG  --   --  2.2 2.3 2.4  PHOS  --   --  3.7 4.2 4.2    GFR: Estimated Creatinine Clearance: 75 mL/min (by C-G formula based on SCr of 1.08 mg/dL).  Liver Function Tests: Recent Labs  Lab 06/26/20 2158 06/27/20 0509 06/28/20 0522 06/29/20 0519 06/30/20 0410  AST 23 22 22 29 24   ALT 28 26 28  36 40  ALKPHOS 70 61 62 73 71  BILITOT 0.7 0.5 0.3 0.3 0.4  PROT 6.3* 5.3* 5.6* 6.6 6.0*  ALBUMIN 3.4* 3.0* 3.1* 3.5 3.3*    CBG: Recent Labs  Lab 06/30/20 0738 06/30/20 0817 06/30/20 1209 06/30/20 1549 06/30/20 1651  GLUCAP 32* 172* 189* 317* 358*     Recent Results (from the past 240 hour(s))  SARS Coronavirus 2 by RT PCR (hospital order, performed in [facility] hospital lab) Nasopharyngeal Nasopharyngeal Swab     Status: None   Collection Time: 06/26/20 11:33 PM   Specimen: Nasopharyngeal Swab  Result Value Ref Range Status   SARS Coronavirus 2 NEGATIVE NEGATIVE Final    Comment: (NOTE) SARS-CoV-2 target nucleic acids are NOT DETECTED.  The SARS-CoV-2 RNA is generally detectable in upper and lower respiratory specimens during  the acute phase of infection. The lowest concentration of SARS-CoV-2 viral copies this assay can detect is 250 copies / mL. A negative result does not preclude SARS-CoV-2 infection and should not be used as the sole basis for treatment or other patient management decisions.  A negative result may occur with improper specimen collection / handling, submission of specimen other than nasopharyngeal swab, presence of viral mutation(s) within the areas targeted by this assay, and inadequate number of viral copies (<250 copies / mL). A negative result must be combined with clinical observations, patient history, and epidemiological information.  Fact Sheet for Patients:   StrictlyIdeas.no  Fact Sheet for Healthcare Providers: BankingDealers.co.za  This test is not yet approved or  cleared by the [place] FDA and has been authorized for detection and/or diagnosis of SARS-CoV-2 by FDA under an Emergency Use Authorization (EUA).  This EUA will remain in effect (meaning this test can be used) for the duration of the COVID-19 declaration under Section 564(b)(1) of the Act, 21 U.S.C. section 360bbb-3(b)(1), unless the authorization is terminated or revoked sooner.  Performed at [facility], [place] [address], [place], [place] [address]   Blood culture (routine x 2)     Status: None (Preliminary result)   Collection Time: 06/26/20 11:33 PM   Specimen: BLOOD  Result Value Ref Range Status   Specimen Description   Final    BLOOD LEFT ANTECUBITAL Performed at [facility] [address] [place], [place] [address]    Special Requests   Final    BOTTLES DRAWN AEROBIC AND ANAEROBIC Blood Culture adequate volume Performed at [facility] [address] [place], [place] [address]    Culture   Final    NO GROWTH 3 DAYS Performed at [facility], [place] [address], [place], [place]  [address]    Report Status PENDING  Incomplete  Urine culture     Status: None   Collection Time: 06/27/20  5:47 AM   Specimen: Urine, Random  Result Value Ref Range Status   Specimen Description   Final  URINE, RANDOM Performed at [facility], [place] [address], [place], [place] [address]    Special Requests   Final    NONE Performed at [facility], [place] [address], [place], [place] [address]    Culture   Final    NO GROWTH Performed at [facility], [place] [address], [place], [place] [address]    Report Status 06/28/2020 FINAL  Final  Blood culture (routine x 2)     Status: None (Preliminary result)   Collection Time: 06/27/20  8:00 AM   Specimen: BLOOD  Result Value Ref Range Status   Specimen Description   Final    BLOOD RIGHT ANTECUBITAL Performed at [place] [address] [place], [place] [address]    Special Requests   Final    BOTTLES DRAWN AEROBIC AND ANAEROBIC Blood Culture results may not be optimal due to an inadequate volume of blood received in culture bottles Performed at [facility] [address], [place], [place] [address]    Culture   Final    NO GROWTH 3 DAYS Performed at [facility], [place] [address] [place], [place] [address]    Report Status PENDING  Incomplete  Respiratory Panel by PCR     Status: None   Collection Time: 06/27/20  8:00 AM   Specimen: Nasopharyngeal Swab; Respiratory  Result Value Ref Range Status   Adenovirus NOT DETECTED NOT DETECTED Final   Coronavirus 229E NOT DETECTED NOT DETECTED Final    Comment: (NOTE) The Coronavirus on the Respiratory Panel, DOES NOT test for the novel  Coronavirus (2019 nCoV)    Coronavirus HKU1 NOT DETECTED NOT DETECTED Final   Coronavirus NL63 NOT DETECTED NOT DETECTED Final   Coronavirus OC43 NOT DETECTED NOT DETECTED Final   Metapneumovirus NOT DETECTED NOT DETECTED Final   Rhinovirus / Enterovirus NOT  DETECTED NOT DETECTED Final   Influenza A NOT DETECTED NOT DETECTED Final   Influenza B NOT DETECTED NOT DETECTED Final   Parainfluenza Virus 1 NOT DETECTED NOT DETECTED Final   Parainfluenza Virus 2 NOT DETECTED NOT DETECTED Final   Parainfluenza Virus 3 NOT DETECTED NOT DETECTED Final   Parainfluenza Virus 4 NOT DETECTED NOT DETECTED Final   Respiratory Syncytial Virus NOT DETECTED NOT DETECTED Final   Bordetella pertussis NOT DETECTED NOT DETECTED Final   Chlamydophila pneumoniae NOT DETECTED NOT DETECTED Final   Mycoplasma pneumoniae NOT DETECTED NOT DETECTED Final    Comment: Performed at [facility], [place]. [address] [place], [place] [address]         Radiology Studies: DG CHEST PORT 1 VIEW  Result Date: 06/29/2020 CLINICAL DATA:  74 y.o male. SOB. History of COPD, peripheral vascular disease, diabetes mellitus type 2, hypothyroidism gout and chronic kidney disease has been experiencing shortness of breath with wheezing for the last 2 weeks EXAM: PORTABLE CHEST 1 VIEW COMPARISON:  Chest radiograph 06/28/2020 FINDINGS: Stable cardiomediastinal contours. Emphysema. No new consolidation. No pneumothorax or large pleural effusion. No acute finding in the visualized skeleton. IMPRESSION: No acute cardiopulmonary finding. Electronically Signed   By: Audie Pinto M.D.   On: 06/29/2020 08:29   DG CHEST PORT 1 VIEW  Result Date: 06/28/2020 CLINICAL DATA:  Acute respiratory failure in a patient with a history of COPD. Chest pain and shortness of breath. EXAM: PORTABLE CHEST 1 VIEW COMPARISON:  PA and lateral chest 06/26/2020.  CT chest 06/27/2020. FINDINGS: The lungs are emphysematous but  clear. Heart size is normal. Aortic atherosclerosis. No pneumothorax or pleural fluid. No acute or focal bony abnormality. IMPRESSION: No acute disease. Aortic Atherosclerosis (ICD10-I70.0) and Emphysema (ICD10-J43.9). Electronically Signed   By: Inge Rise M.D.   On: 06/28/2020 17:37         Scheduled Meds: . albuterol  2.5 mg Nebulization BID  . allopurinol  100 mg Oral BID  . amLODipine  10 mg Oral Daily  . arformoterol  15 mcg Nebulization BID  . budesonide (PULMICORT) nebulizer solution  0.5 mg Nebulization BID  . clopidogrel  75 mg Oral Daily  . dextrose      . enoxaparin (LOVENOX) injection  40 mg Subcutaneous Q24H  . fluticasone  2 spray Each Nare Daily  . hydrALAZINE  25 mg Oral TID  . insulin aspart  0-15 Units Subcutaneous TID WC  . insulin aspart  0-5 Units Subcutaneous QHS  . insulin regular human CONCENTRATED  65 Units Subcutaneous TID WC  . levothyroxine  150 mcg Oral Q0600  . methylPREDNISolone (SOLU-MEDROL) injection  40 mg Intravenous Daily  . multivitamin with minerals  1 tablet Oral Daily  . pantoprazole (PROTONIX) IV  40 mg Intravenous Q24H  . revefenacin  175 mcg Nebulization Daily  . rOPINIRole  0.25 mg Oral QHS  . rosuvastatin  20 mg Oral Daily  . sertraline  25 mg Oral Daily  . sodium chloride  1 spray Each Nare BID  . tamsulosin  0.4 mg Oral QPC supper   Continuous Infusions: . azithromycin 500 mg (06/30/20 1603)  . cefTRIAXone (ROCEPHIN)  IV 2 g (06/30/20 1359)     LOS: 3 days    Time spent: over 30 min    Fayrene Helper, MD [facility]   To contact the attending provider between 7A-7P or the covering provider during after hours 7P-7A, please log into the web site [email or web address] and access using universal [place] password for that web site. If you do not have the password, please call the hospital operator.  06/30/2020, 4:53 PM

## 2020-06-30 NOTE — Progress Notes (Signed)
Patient's CBG is 32. Patient is alert but sluggish and sweaty. "Feel like my sugar is low". Dextrose 25 grams given per protocol along with juice and graham crackers. Md notified.Eulas Post, RN

## 2020-06-30 NOTE — Progress Notes (Signed)
Patient's CBG is 32. Patient is diaphoretic and has slow slurring speech, but answering questions appropriately. Hypoglycemic initiated. Patient able to take juice po. Md notified.Eulas Post, RN

## 2020-07-01 LAB — COMPREHENSIVE METABOLIC PANEL
ALT: 39 U/L (ref 0–44)
AST: 20 U/L (ref 15–41)
Albumin: 2.9 g/dL — ABNORMAL LOW (ref 3.5–5.0)
Alkaline Phosphatase: 69 U/L (ref 38–126)
Anion gap: 8 (ref 5–15)
BUN: 26 mg/dL — ABNORMAL HIGH (ref 8–23)
CO2: 25 mmol/L (ref 22–32)
Calcium: 8.4 mg/dL — ABNORMAL LOW (ref 8.9–10.3)
Chloride: 103 mmol/L (ref 98–111)
Creatinine, Ser: 1.14 mg/dL (ref 0.61–1.24)
GFR calc Af Amer: >60 mL/min (ref >60)
GFR calc non Af Amer: >60 mL/min (ref >60)
Glucose, Bld: 276 mg/dL — ABNORMAL HIGH (ref 70–99)
Potassium: 5 mmol/L (ref 3.5–5.1)
Sodium: 136 mmol/L (ref 135–145)
Total Bilirubin: 0.7 mg/dL (ref 0.3–1.2)
Total Protein: 5.3 g/dL — ABNORMAL LOW (ref 6.5–8.1)

## 2020-07-01 LAB — CBC WITH DIFFERENTIAL/PLATELET
Abs Immature Granulocytes: 0.1 10*3/uL — ABNORMAL HIGH (ref 0.00–0.07)
Basophils Absolute: 0 10*3/uL (ref 0.0–0.1)
Basophils Relative: 0 %
Eosinophils Absolute: 0 10*3/uL (ref 0.0–0.5)
Eosinophils Relative: 0 %
HCT: 33.3 % — ABNORMAL LOW (ref 39.0–52.0)
Hemoglobin: 10.4 g/dL — ABNORMAL LOW (ref 13.0–17.0)
Immature Granulocytes: 1 %
Lymphocytes Relative: 18 %
Lymphs Abs: 1.8 10*3/uL (ref 0.7–4.0)
MCH: 28 pg (ref 26.0–34.0)
MCHC: 31.2 g/dL (ref 30.0–36.0)
MCV: 89.8 fL (ref 80.0–100.0)
Monocytes Absolute: 0.5 10*3/uL (ref 0.1–1.0)
Monocytes Relative: 5 %
Neutro Abs: 7.4 10*3/uL (ref 1.7–7.7)
Neutrophils Relative %: 76 %
Platelets: 232 10*3/uL (ref 150–400)
RBC: 3.71 MIL/uL — ABNORMAL LOW (ref 4.22–5.81)
RDW: 14.5 % (ref 11.5–15.5)
WBC: 9.8 10*3/uL (ref 4.0–10.5)
nRBC: 0 % (ref 0.0–0.2)

## 2020-07-01 LAB — GLUCOSE, CAPILLARY
Glucose-Capillary: 236 mg/dL — ABNORMAL HIGH (ref 70–99)
Glucose-Capillary: 260 mg/dL — ABNORMAL HIGH (ref 70–99)
Glucose-Capillary: 292 mg/dL — ABNORMAL HIGH (ref 70–99)
Glucose-Capillary: 298 mg/dL — ABNORMAL HIGH (ref 70–99)
Glucose-Capillary: 300 mg/dL — ABNORMAL HIGH (ref 70–99)
Glucose-Capillary: 35 mg/dL — CL (ref 70–99)

## 2020-07-01 LAB — PHOSPHORUS: Phosphorus: 3.7 mg/dL (ref 2.5–4.6)

## 2020-07-01 LAB — MAGNESIUM: Magnesium: 2.3 mg/dL (ref 1.7–2.4)

## 2020-07-01 MED ORDER — ADVAIR HFA 115-21 MCG/ACT IN AERO: 2.0000 | INHALATION_SPRAY | Freq: Two times a day (BID) | RESPIRATORY_TRACT | 0 refills | Status: DC

## 2020-07-01 MED ORDER — SERTRALINE HCL 25 MG PO TABS: 25.0000 mg | ORAL_TABLET | Freq: Every day | ORAL | 0 refills | Status: DC

## 2020-07-01 NOTE — Plan of Care (Signed)
  Problem: Activity: Goal: Risk for activity intolerance will decrease Outcome: Adequate for Discharge   Problem: Nutrition: Goal: Adequate nutrition will be maintained Outcome: Adequate for Discharge   Problem: Safety: Goal: Ability to remain free from injury will improve Outcome: Adequate for Discharge   

## 2020-07-01 NOTE — Discharge Summary (Signed)
Physician Discharge Summary  Sean Saunders SAY:[id number] DOB: Oct 01, 1946 DOA: 06/26/2020  PCP: Martinique, Betty G, MD  Admit date: 06/26/2020 Discharge date: 07/01/2020  Admitted From: Home Disposition: Home   Recommendations for Outpatient Follow-up:  1. Follow up with PCP in 1-2 weeks to discuss depression/anxiety. Started zoloft and recommended psychotherapy. 2. Continue IDDM management. No appreciable changes made at discharge as the patient does not require steroids. 3. Follow up with pulmonology 8/5 as scheduled. Started advair per PCCM recommendations.   Home Health: PT Equipment/Devices: None Discharge Condition: Stable CODE STATUS: DNR Diet recommendation: Carb-modified  Brief/Interim Summary: Sean Saunders a 74 y.o.malewithhistory of COPD, peripheral vascular disease, diabetes mellitus type 2, hypothyroidism gout and chronic kidney disease has been experiencing shortness of breath with wheezing for the last 2 weeks had gone to his primary care physician about 2 weeks ago when he was prescribed steroids and cough suppressants. Despite which his symptoms did not improve and he went to [place] where patient had CT of the sinuses and per the patient did not show anything acute and was prescribed for the cough suppressants. Patient shortness of breath continued to worsen. Has been having some productive cough wheezing. Denies any chest pain nausea vomiting abdominal pain or diarrhea. Had had Covid vaccination.  ED Course:In the ER patient is febrile with temperature of 101.2 F and on exam patient is wheezing bilaterally with chest x-ray showing nothing acute. Labs showing creatinine 1.2 hemoglobin 9.2 blood glucose of 202. EKG shows sinus tachycardia. Patient admitted for acute respiratory failure hypoxia secondary to COPD exacerbation with fever. UA is pending. Covid test was negative.  Hospital Course: Pulmonology was consulted and with treatments for COPD exacerbation,  he has improved significantly.   Discharge Diagnoses:  Principal Problem:   Acute respiratory failure with hypoxia (HCC) Active Problems:   Peripheral vascular disease, unspecified ([place])   Type 2 diabetes mellitus with diabetic neuropathy, unspecified (HCC)   CKD (chronic kidney disease), stage II   Vestibular schwannoma (HCC)   COPD with acute exacerbation (HCC)   COPD exacerbation (HCC)  Acute hypoxic respiratory failure due to COPD exacerbation and acute bronchitis: Has completed 5 days steroids and antibiotics. No infiltrate on CT chest. - Cleared for discharge by pulmonology with follow up already scheduled.  - Starting advair per pulm rec's.  Depression, anxiety: Cleared for discharge by psychiatry.  - Will start sertraline and urge PCP follow up. - Reluctant to attempt counseling.  IDT2DM: Continue outpatient management  Anemia of chronic disease: Stable  PVD: Stable. Continue home medications  Demand myocardial ischemia: Mild troponin elevation due to respiratory failure above.  - Consider outpatient risk stratification  Hypothyroidism:  - Continue synthroid  Gout:  - Continue allopurinol.  HTN: Continue home medications  AKI on stage II CKD: CrCl is 32ml/min at discharge consistent with stage II disease.   Obesity: Estimated body mass index is 32.23 kg/m as calculated from the following:   Height as of this encounter: 6' (1.829 m).   Weight as of this encounter: 107.8 kg.  Discharge Instructions Discharge Instructions    Discharge instructions   Complete by: As directed    - Follow up with pulmonology as scheduled for you on 8/5. Advair was recommended by the pulmonology team and has been sent to your pharmacy. If there are issues with this, please contact [facility].  - If you develop shortness of breath or new chest pain, seek medical attention right away. Otherwise, follow up with your  PCP in the next 1-2 weeks for hospital follow up and  to discuss medications for depression/anxiety.     Allergies as of 07/01/2020      Reactions   Eszopiclone Itching   Niaspan [niacin Er] Nausea And Vomiting, Rash      Medication List    STOP taking these medications   benzonatate 100 MG capsule Commonly known as: Tessalon Perles     TAKE these medications   Advair HFA 115-21 MCG/ACT inhaler Generic drug: fluticasone-salmeterol Inhale 2 puffs into the lungs 2 (two) times daily.   amLODipine 10 MG tablet Commonly known as: NORVASC Take 10 mg by mouth daily.   clopidogrel 75 MG tablet Commonly known as: PLAVIX Take 75 mg by mouth daily.   Combivent Respimat 20-100 MCG/ACT Aers respimat Generic drug: Ipratropium-Albuterol Inhale 2 puffs into the lungs 3 (three) times daily.   fluticasone 50 MCG/ACT nasal spray Commonly known as: FLONASE Place 2 sprays into both nostrils daily.   furosemide 20 MG tablet Commonly known as: LASIX Take 20 mg by mouth every other day.   HUMULIN R 500 UNIT/ML injection Generic drug: insulin regular human CONCENTRATED Inject 110-140 Units into the skin See admin instructions. 110 units at breakfast, 140 units at lunch, 110 units at dinner   hydrALAZINE 50 MG tablet Commonly known as: APRESOLINE Take 50 mg by mouth every morning.   hydrochlorothiazide 25 MG tablet Commonly known as: HYDRODIURIL Take 25 mg by mouth daily.   levothyroxine 150 MCG tablet Commonly known as: SYNTHROID Take 150 mcg by mouth daily before breakfast.   lisinopril 40 MG tablet Commonly known as: ZESTRIL Take 40 mg by mouth daily.   metFORMIN 500 MG tablet Commonly known as: GLUCOPHAGE Take 500 mg by mouth 2 (two) times daily with a meal.   multivitamin with minerals Tabs tablet Take 1 tablet by mouth daily.   rOPINIRole 0.25 MG tablet Commonly known as: REQUIP Take 0.25 mg by mouth at bedtime.   rosuvastatin 40 MG tablet Commonly known as: CRESTOR Take 20 mg by mouth daily.   sertraline 25 MG  tablet Commonly known as: ZOLOFT Take 1 tablet (25 mg total) by mouth daily. Start taking on: July 02, 2020   tamsulosin 0.4 MG Caps capsule Commonly known as: FLOMAX Take 0.4 mg by mouth.   Zyloprim 100 MG tablet Generic drug: allopurinol Take 100 mg by mouth 2 (two) times daily.       Follow-up Information    Parrett, Fonnie Mu, NP Follow up on 07/16/2020.   Specialty: Pulmonary Disease Why: Hospital follow up with pulmonary.  Appt at 2:00 PM.  Please arrive at 1:45 PM for check in. Contact information: [address] [place] [address] [phone]        Martinique, Betty G, MD. Schedule an appointment as soon as possible for a visit.   Specialty: Family Medicine Contact information: [address] [place] [address] [phone]              Allergies  Allergen Reactions  . Eszopiclone Itching  . Niaspan [Niacin Er] Nausea And Vomiting and Rash    Consultations:  Pulmonology  Psychiatry  Procedures/Studies: DG Chest 2 View  Result Date: 06/26/2020 CLINICAL DATA:  Cough. EXAM: CHEST - 2 VIEW COMPARISON:  December 31, 2019 FINDINGS: There is no evidence of acute infiltrate, pleural effusion or pneumothorax. The heart size and mediastinal contours are within normal limits. There is mild calcification of the aortic arch. The  visualized skeletal structures are unremarkable. IMPRESSION: No active cardiopulmonary disease. Electronically Signed   By: Virgina Norfolk M.D.   On: 06/26/2020 22:37   CT CHEST WO CONTRAST  Result Date: 06/27/2020 CLINICAL DATA:  Cough and shortness of breath for the past 3 weeks. EXAM: CT CHEST WITHOUT CONTRAST TECHNIQUE: Multidetector CT imaging of the chest was performed following the standard protocol without IV contrast. COMPARISON:  Chest radiographs obtained yesterday. FINDINGS: Cardiovascular: Atheromatous calcifications, including the coronary arteries and aorta. Minimal pericardial effusion with a maximum  thickness of 8 mm. Mediastinum/Nodes: No enlarged mediastinal or axillary lymph nodes. Thyroid gland, trachea, and esophagus demonstrate no significant findings. Lungs/Pleura: Mild bilateral bullous changes in a centrilobular distribution. No airspace consolidation or pleural fluid. Upper Abdomen: Multiple small gallstones in the gallbladder measuring up to 7 mm in maximum diameter each. No gallbladder wall thickening or pericholecystic fluid. Musculoskeletal: Thoracic and lower cervical spine degenerative changes. IMPRESSION: 1. No acute abnormality. 2. Mild changes of COPD. 3. Minimal pericardial effusion. 4. Cholelithiasis. 5.  Calcific coronary artery and aortic atherosclerosis. Aortic Atherosclerosis (ICD10-I70.0) and Emphysema (ICD10-J43.9). Electronically Signed   By: Claudie Revering M.D.   On: 06/27/2020 14:26   DG CHEST PORT 1 VIEW  Result Date: 06/29/2020 CLINICAL DATA:  74 y.o male. SOB. History of COPD, peripheral vascular disease, diabetes mellitus type 2, hypothyroidism gout and chronic kidney disease has been experiencing shortness of breath with wheezing for the last 2 weeks EXAM: PORTABLE CHEST 1 VIEW COMPARISON:  Chest radiograph 06/28/2020 FINDINGS: Stable cardiomediastinal contours. Emphysema. No new consolidation. No pneumothorax or large pleural effusion. No acute finding in the visualized skeleton. IMPRESSION: No acute cardiopulmonary finding. Electronically Signed   By: Audie Pinto M.D.   On: 06/29/2020 08:29   DG CHEST PORT 1 VIEW  Result Date: 06/28/2020 CLINICAL DATA:  Acute respiratory failure in a patient with a history of COPD. Chest pain and shortness of breath. EXAM: PORTABLE CHEST 1 VIEW COMPARISON:  PA and lateral chest 06/26/2020.  CT chest 06/27/2020. FINDINGS: The lungs are emphysematous but clear. Heart size is normal. Aortic atherosclerosis. No pneumothorax or pleural fluid. No acute or focal bony abnormality. IMPRESSION: No acute disease. Aortic Atherosclerosis  (ICD10-I70.0) and Emphysema (ICD10-J43.9). Electronically Signed   By: Inge Rise M.D.   On: 06/28/2020 17:37    Subjective: Feels much better, wants to go home. No shortness of breath or wheezing or fever at present.  Discharge Exam: Vitals:   07/01/20 0819 07/01/20 1100  BP:    Pulse:    Resp:    Temp:    SpO2: 98% 97%   General: Pleasant, in no distress Cardiovascular: RRR, S1/S2 +, no rubs, no gallops Respiratory: CTA bilaterally, no wheezing, no rhonchi Abdominal: Soft, NT, ND, bowel sounds + Extremities: No edema, no cyanosis  Labs: BNP (last 3 results) Recent Labs    06/27/20 0509  BNP 31.5   Basic Metabolic Panel: Recent Labs  Lab 06/27/20 0509 06/28/20 0522 06/29/20 0519 06/30/20 0410 07/01/20 0433  NA 131* 141 140 142 136  K 3.9 4.4 5.0 4.0 5.0  CL 100 107 107 105 103  CO2 19* 21* 24 26 25   GLUCOSE 484* 111* 76 47* 276*  BUN 24* 28* 34* 30* 26*  CREATININE 1.61* 1.18 1.39* 1.08 1.14  CALCIUM 8.2* 9.3 9.3 9.2 8.4*  MG  --  2.2 2.3 2.4 2.3  PHOS  --  3.7 4.2 4.2 3.7   Liver Function Tests: Recent Labs  Lab 06/27/20  3295 06/28/20 0522 06/29/20 0519 06/30/20 0410 07/01/20 0433  AST 22 22 29 24 20   ALT 26 28 36 40 39  ALKPHOS 61 62 73 71 69  BILITOT 0.5 0.3 0.3 0.4 0.7  PROT 5.3* 5.6* 6.6 6.0* 5.3*  ALBUMIN 3.0* 3.1* 3.5 3.3* 2.9*   No results for input(s): LIPASE, AMYLASE in the last 168 hours. No results for input(s): AMMONIA in the last 168 hours. CBC: Recent Labs  Lab 06/26/20 2158 06/26/20 2158 06/27/20 0509 06/28/20 0522 06/29/20 0519 06/30/20 0410 07/01/20 0433  WBC 9.4   < > 6.4 11.6* 13.4* 14.1* 9.8  NEUTROABS 6.5  --   --  8.3* 11.1* 10.3* 7.4  HGB 11.2*   < > 9.9* 10.2* 11.0* 10.6* 10.4*  HCT 34.7*   < > 31.2* 31.1* 34.4* 33.4* 33.3*  MCV 88.1   < > 91.2 88.6 89.1 89.5 89.8  PLT 191   < > 161 191 237 245 232   < > = values in this interval not displayed.   Cardiac Enzymes: No results for input(s): CKTOTAL, CKMB,  CKMBINDEX, TROPONINI in the last 168 hours. BNP: Invalid input(s): POCBNP CBG: Recent Labs  Lab 07/01/20 0032 07/01/20 0535 07/01/20 0746 07/01/20 1212 07/01/20 1213  GLUCAP 236* 260* 292* 300* 298*   D-Dimer No results for input(s): DDIMER in the last 72 hours. Hgb A1c No results for input(s): HGBA1C in the last 72 hours. Lipid Profile No results for input(s): CHOL, HDL, LDLCALC, TRIG, CHOLHDL, LDLDIRECT in the last 72 hours. Thyroid function studies No results for input(s): TSH, T4TOTAL, T3FREE, THYROIDAB in the last 72 hours.  Invalid input(s): FREET3 Anemia work up No results for input(s): VITAMINB12, FOLATE, FERRITIN, TIBC, IRON, RETICCTPCT in the last 72 hours. Urinalysis    Component Value Date/Time   COLORURINE YELLOW 06/27/2020 [place] 06/27/2020 0547   LABSPEC 1.013 06/27/2020 0547   PHURINE 5.0 06/27/2020 0547   GLUCOSEU >=500 (A) 06/27/2020 0547   HGBUR NEGATIVE 06/27/2020 0547   BILIRUBINUR NEGATIVE 06/27/2020 0547   KETONESUR 5 (A) 06/27/2020 0547   PROTEINUR 100 (A) 06/27/2020 0547   UROBILINOGEN 0.2 07/03/2012 0937   NITRITE NEGATIVE 06/27/2020 0547   LEUKOCYTESUR NEGATIVE 06/27/2020 0547    Microbiology Recent Results (from the past 240 hour(s))  SARS Coronavirus 2 by RT PCR (hospital order, performed in [facility] hospital lab) Nasopharyngeal Nasopharyngeal Swab     Status: None   Collection Time: 06/26/20 11:33 PM   Specimen: Nasopharyngeal Swab  Result Value Ref Range Status   SARS Coronavirus 2 NEGATIVE NEGATIVE Final    Comment: (NOTE) SARS-CoV-2 target nucleic acids are NOT DETECTED.  The SARS-CoV-2 RNA is generally detectable in upper and lower respiratory specimens during the acute phase of infection. The lowest concentration of SARS-CoV-2 viral copies this assay can detect is 250 copies / mL. A negative result does not preclude SARS-CoV-2 infection and should not be used as the sole basis for treatment or  other patient management decisions.  A negative result may occur with improper specimen collection / handling, submission of specimen other than nasopharyngeal swab, presence of viral mutation(s) within the areas targeted by this assay, and inadequate number of viral copies (<250 copies / mL). A negative result must be combined with clinical observations, patient history, and epidemiological information.  Fact Sheet for Patients:   StrictlyIdeas.no  Fact Sheet for Healthcare Providers: BankingDealers.co.za  This test is not yet approved or  cleared by the [place] FDA  and has been authorized for detection and/or diagnosis of SARS-CoV-2 by FDA under an Emergency Use Authorization (EUA).  This EUA will remain in effect (meaning this test can be used) for the duration of the COVID-19 declaration under Section 564(b)(1) of the Act, 21 U.S.C. section 360bbb-3(b)(1), unless the authorization is terminated or revoked sooner.  Performed at [facility], [place] [address], [place], [place] [id number]   Blood culture (routine x 2)     Status: None (Preliminary result)   Collection Time: 06/26/20 11:33 PM   Specimen: BLOOD  Result Value Ref Range Status   Specimen Description   Final    BLOOD LEFT ANTECUBITAL Performed at [facility] [address] [place], [place] [address]    Special Requests   Final    BOTTLES DRAWN AEROBIC AND ANAEROBIC Blood Culture adequate volume Performed at [facility] [address] [place], [place] [address]    Culture   Final    NO GROWTH 4 DAYS Performed at [facility], [place] [address], [place], [place] [address]    Report Status PENDING  Incomplete  Urine culture     Status: None   Collection Time: 06/27/20  5:47 AM   Specimen: Urine, Random  Result Value Ref Range Status   Specimen Description   Final    URINE, RANDOM Performed at  [facility] [address] [place], [place] [address]    Special Requests   Final    NONE Performed at [facility], [place] [address] [place], [place] [address]    Culture   Final    NO GROWTH Performed at [facility], [place] [address], [place], [place] [address]    Report Status 06/28/2020 FINAL  Final  Blood culture (routine x 2)     Status: None (Preliminary result)   Collection Time: 06/27/20  8:00 AM   Specimen: BLOOD  Result Value Ref Range Status   Specimen Description   Final    BLOOD RIGHT ANTECUBITAL Performed at [facility] [address] [place], [place] [address]    Special Requests   Final    BOTTLES DRAWN AEROBIC AND ANAEROBIC Blood Culture results may not be optimal due to an inadequate volume of blood received in culture bottles Performed at [facility] [address], [place], [place] [address]    Culture   Final    NO GROWTH 4 DAYS Performed at [facility], [place] [address] [place], [place] [address]    Report Status PENDING  Incomplete  Respiratory Panel by PCR     Status: None   Collection Time: 06/27/20  8:00 AM   Specimen: Nasopharyngeal Swab; Respiratory  Result Value Ref Range Status   Adenovirus NOT DETECTED NOT DETECTED Final   Coronavirus 229E NOT DETECTED NOT DETECTED Final    Comment: (NOTE) The Coronavirus on the Respiratory Panel, DOES NOT test for the novel  Coronavirus (2019 nCoV)    Coronavirus HKU1 NOT DETECTED NOT DETECTED Final   Coronavirus NL63 NOT DETECTED NOT DETECTED Final   Coronavirus OC43 NOT DETECTED NOT DETECTED Final   Metapneumovirus NOT DETECTED NOT DETECTED Final   Rhinovirus / Enterovirus NOT DETECTED NOT DETECTED Final   Influenza A NOT DETECTED NOT DETECTED Final   Influenza B NOT DETECTED NOT DETECTED Final   Parainfluenza Virus 1 NOT DETECTED NOT DETECTED Final   Parainfluenza Virus 2 NOT DETECTED NOT  DETECTED Final    Parainfluenza Virus 3 NOT DETECTED NOT DETECTED Final   Parainfluenza Virus 4 NOT DETECTED NOT DETECTED Final   Respiratory Syncytial Virus NOT DETECTED NOT DETECTED Final   Bordetella pertussis NOT DETECTED NOT DETECTED Final   Chlamydophila pneumoniae NOT DETECTED NOT DETECTED Final   Mycoplasma pneumoniae NOT DETECTED NOT DETECTED Final    Comment: Performed at [facility], [place] [address], [place], [place] [address]    Time coordinating discharge: Approximately 40 minutes  Patrecia Pour, MD  [facility] 07/01/2020, 7:20 PM

## 2020-07-01 NOTE — Consult Note (Signed)
[facility] Face-to-Face Psychiatry Consult   Reason for Consult:  "Depression" Referring Physician:  Noe Gens NP Patient Identification: Sean Saunders MRN:  [id number] Principal Diagnosis: Acute respiratory failure with hypoxia [facility]) Diagnosis:  Principal Problem:   Acute respiratory failure with hypoxia ([place]) Active Problems:   Peripheral vascular disease, unspecified ([place])   Type 2 diabetes mellitus with diabetic neuropathy, unspecified ([place])   CKD (chronic kidney disease), stage III   Vestibular schwannoma ([place])   COPD with acute exacerbation (HCC)   COPD exacerbation ([place])   Total Time spent with patient: 30 minutes  Subjective: Patient states "I know why you are here, I have long-term COPD and diabetes, I know I said something about being ready to die but I would never hurt myself, the Lord and me know the way I am going to go and it will likely be because of my COPD."  HPI: Sean Saunders is a 73 y.o. male patient admitted with shortness of breath.  Patient reports "I have been dealing with 2 different doctors for 3 weeks trying to get antibiotics and steroids and I was frustrated." Patient denies suicidal ideations.  Patient denies history of suicide attempts.  Patient denies homicidal ideations.  Patient denies auditory visual hallucinations.  Patient denies symptoms of paranoia. Patient reports he currently resides in [place] with his daughter and granddaughter.  Patient reports he is retired but enjoys gardening and driving his sports car.  Patient reports he is followed medically outpatient by the [facility].  Patient reports rare alcohol use and denies substance use. Patient denies any history of mental illness.  Patient reports he has a strong faith and it would be forbidden for him to harm himself. Patient offered support and encouragement.    Past Psychiatric History: None reported  Risk to Self:  Denies Risk to Others:  Denies Prior Inpatient Therapy:  None  reported Prior Outpatient Therapy:  None reported  Past Medical History:  Past Medical History:  Diagnosis Date  . Arthritis   . COPD (chronic obstructive pulmonary disease) ([place])   . Diabetes mellitus   . DVT (deep venous thrombosis) ([place])   . GERD (gastroesophageal reflux disease)   . Hyperlipidemia   . Hypertension   . Hypothyroidism   . Onychomycosis 06/07/2013  . Stroke [facility])    TIA's 01/05/11-01/08/11-01/26/11  . TIA (transient ischemic attack) 08/03/2011   Left sided weakness. 3 episodes Feb-March    Past Surgical History:  Procedure Laterality Date  . ABDOMINAL AORTAGRAM N/A 06/04/2012   Procedure: ABDOMINAL Maxcine Ham;  Surgeon: Angelia Mould, MD;  Location: [facility] CATH LAB;  Service: Cardiovascular;  Laterality: N/A;  . APPENDECTOMY    . COLONOSCOPY    . FEMORAL-POPLITEAL BYPASS GRAFT  07/05/2012   Procedure: BYPASS GRAFT FEMORAL-POPLITEAL ARTERY;  Surgeon: Angelia Mould, MD;  Location: [facility] OR;  Service: Vascular;  Laterality: Left;  Revision Left fem/pop w/ artery patch angioplasty utilizing left GSV graft and extenion to tib/per trunk w/right GSV and interop arteriogram  . JOINT REPLACEMENT  2011   -Hip  . NASAL SINUS SURGERY    . PR VEIN BYPASS GRAFT,AORTO-FEM-POP    . PR VEIN BYPASS GRAFT,AORTO-FEM-POP  08-01-11   (L) Fem-pop BP  . toe nail    . TONSILLECTOMY     Family History:  Family History  Problem Relation Age of Onset  . Stroke Mother   . Diabetes Mother   . Other Father        alzheimers  . Diabetes  Father   . Stroke Father   . Diabetes Sister    Family Psychiatric  History: None reported Social History:  Social History   Substance and Sexual Activity  Alcohol Use Yes  . Alcohol/week: 0.0 standard drinks   Comment: rare     Social History   Substance and Sexual Activity  Drug Use No    Social History   Socioeconomic History  . Marital status: Married    Spouse name: Not on file  . Number of children: Not on file  . Years of  education: Not on file  . Highest education level: Not on file  Occupational History  . Not on file  Tobacco Use  . Smoking status: Former Smoker    Packs/day: 1.00    Years: 25.00    Pack years: 25.00    Types: Cigarettes    Quit date: 06/05/2018    Years since quitting: 2.0  . Smokeless tobacco: Never Used  Vaping Use  . Vaping Use: Never used  Substance and Sexual Activity  . Alcohol use: Yes    Alcohol/week: 0.0 standard drinks    Comment: rare  . Drug use: No  . Sexual activity: Not on file  Other Topics Concern  . Not on file  Social History Narrative  . Not on file   Social Determinants of Health   Financial Resource Strain:   . Difficulty of Paying Living Expenses:   Food Insecurity:   . Worried About Charity fundraiser in the Last Year:   . Arboriculturist in the Last Year:   Transportation Needs: No Transportation Needs  . Lack of Transportation (Medical): No  . Lack of Transportation (Non-Medical): No  Physical Activity: Sufficiently Active  . Days of Exercise per Week: 3 days  . Minutes of Exercise per Session: 60 min  Stress:   . Feeling of Stress :   Social Connections:   . Frequency of Communication with Friends and Family:   . Frequency of Social Gatherings with Friends and Family:   . Attends Religious Services:   . Active Member of Clubs or Organizations:   . Attends Archivist Meetings:   Marland Kitchen Marital Status:    Additional Social History:    Allergies:   Allergies  Allergen Reactions  . Eszopiclone Itching  . Niaspan [Niacin Er] Nausea And Vomiting and Rash    Labs:  Results for orders placed or performed during the hospital encounter of 06/26/20 (from the past 48 hour(s))  Troponin I (High Sensitivity)     Status: Abnormal   Collection Time: 06/29/20  2:24 PM  Result Value Ref Range   Troponin I (High Sensitivity) 37 (H) <18 ng/L    Comment: DELTA CHECK NOTED (NOTE) Elevated high sensitivity troponin I (hsTnI) values and  significant  changes across serial measurements may suggest ACS but many other  chronic and acute conditions are known to elevate hsTnI results.  Refer to the Links section for chest pain algorithms and additional  guidance. Performed at [facility], [place] [address] [place], [place] [address]   Troponin I (High Sensitivity)     Status: Abnormal   Collection Time: 06/29/20  4:01 PM  Result Value Ref Range   Troponin I (High Sensitivity) 36 (H) <18 ng/L    Comment: (NOTE) Elevated high sensitivity troponin I (hsTnI) values and significant  changes across serial measurements may suggest ACS but many other  chronic and acute conditions are known to  elevate hsTnI results.  Refer to the "Links" section for chest pain algorithms and additional  guidance. Performed at [facility], [place] [address] [place], [place] [address]   Glucose, capillary     Status: Abnormal   Collection Time: 06/29/20  4:54 PM  Result Value Ref Range   Glucose-Capillary 402 (H) 70 - 99 mg/dL    Comment: Glucose reference range applies only to samples taken after fasting for at least 8 hours.  Glucose, capillary     Status: Abnormal   Collection Time: 06/29/20  7:40 PM  Result Value Ref Range   Glucose-Capillary 423 (H) 70 - 99 mg/dL    Comment: Glucose reference range applies only to samples taken after fasting for at least 8 hours.   Comment 1 Notify RN    Comment 2 Document in Chart   Glucose, capillary     Status: Abnormal   Collection Time: 06/30/20 12:52 AM  Result Value Ref Range   Glucose-Capillary 207 (H) 70 - 99 mg/dL    Comment: Glucose reference range applies only to samples taken after fasting for at least 8 hours.   Comment 1 Notify RN    Comment 2 Document in Chart   Glucose, capillary     Status: None   Collection Time: 06/30/20  3:32 AM  Result Value Ref Range   Glucose-Capillary 80 70 - 99 mg/dL    Comment: Glucose reference range applies only to  samples taken after fasting for at least 8 hours.   Comment 1 Notify RN    Comment 2 Document in Chart   CBC with Differential/Platelet     Status: Abnormal   Collection Time: 06/30/20  4:10 AM  Result Value Ref Range   WBC 14.1 (H) 4.0 - 10.5 K/uL   RBC 3.73 (L) 4.22 - 5.81 MIL/uL   Hemoglobin 10.6 (L) 13.0 - 17.0 g/dL   HCT 33.4 (L) 39 - 52 %   MCV 89.5 80.0 - 100.0 fL   MCH 28.4 26.0 - 34.0 pg   MCHC 31.7 30.0 - 36.0 g/dL   RDW 14.6 11.5 - 15.5 %   Platelets 245 150 - 400 K/uL   nRBC 0.0 0.0 - 0.2 %   Neutrophils Relative % 72 %   Neutro Abs 10.3 (H) 1.7 - 7.7 K/uL   Lymphocytes Relative 19 %   Lymphs Abs 2.6 0.7 - 4.0 K/uL   Monocytes Relative 8 %   Monocytes Absolute 1.1 (H) 0 - 1 K/uL   Eosinophils Relative 0 %   Eosinophils Absolute 0.0 0 - 0 K/uL   Basophils Relative 0 %   Basophils Absolute 0.0 0 - 0 K/uL   Immature Granulocytes 1 %   Abs Immature Granulocytes 0.09 (H) 0.00 - 0.07 K/uL    Comment: Performed at [facility], [place] [address], [place], [place] [address]  Comprehensive metabolic panel     Status: Abnormal   Collection Time: 06/30/20  4:10 AM  Result Value Ref Range   Sodium 142 135 - 145 mmol/L   Potassium 4.0 3.5 - 5.1 mmol/L    Comment: DELTA CHECK NOTED   Chloride 105 98 - 111 mmol/L   CO2 26 22 - 32 mmol/L   Glucose, Bld 47 (L) 70 - 99 mg/dL    Comment: Glucose reference range applies only to samples taken after fasting for at least 8 hours.   BUN 30 (H) 8 - 23 mg/dL   Creatinine, Ser 1.08 0.61 -  1.24 mg/dL   Calcium 9.2 8.9 - 10.3 mg/dL   Total Protein 6.0 (L) 6.5 - 8.1 g/dL   Albumin 3.3 (L) 3.5 - 5.0 g/dL   AST 24 15 - 41 U/L   ALT 40 0 - 44 U/L   Alkaline Phosphatase 71 38 - 126 U/L   Total Bilirubin 0.4 0.3 - 1.2 mg/dL   GFR calc non Af Amer >60 >60 mL/min   GFR calc Af Amer >60 >60 mL/min   Anion gap 11 5 - 15    Comment: Performed at [facility], [place] [address], [place], [place] [address]   Magnesium     Status: None   Collection Time: 06/30/20  4:10 AM  Result Value Ref Range   Magnesium 2.4 1.7 - 2.4 mg/dL    Comment: Performed at [facility], [place] [address], [place], [place] [address]  Phosphorus     Status: None   Collection Time: 06/30/20  4:10 AM  Result Value Ref Range   Phosphorus 4.2 2.5 - 4.6 mg/dL    Comment: Performed at [facility], [place] [address] [place],  [address]  Glucose, capillary     Status: Abnormal   Collection Time: 06/30/20  7:37 AM  Result Value Ref Range   Glucose-Capillary 35 (LL) 70 - 99 mg/dL    Comment: Glucose reference range applies only to samples taken after fasting for at least 8 hours.   Comment 1 Notify RN   Glucose, capillary     Status: Abnormal   Collection Time: 06/30/20  7:38 AM  Result Value Ref Range   Glucose-Capillary 32 (LL) 70 - 99 mg/dL    Comment: Glucose reference range applies only to samples taken after fasting for at least 8 hours.   Comment 1 Notify RN   Glucose, capillary     Status: Abnormal   Collection Time: 06/30/20  8:17 AM  Result Value Ref Range   Glucose-Capillary 172 (H) 70 - 99 mg/dL    Comment: Glucose reference range applies only to samples taken after fasting for at least 8 hours.  Glucose, capillary     Status: Abnormal   Collection Time: 06/30/20 12:09 PM  Result Value Ref Range   Glucose-Capillary 189 (H) 70 - 99 mg/dL    Comment: Glucose reference range applies only to samples taken after fasting for at least 8 hours.  Glucose, capillary     Status: Abnormal   Collection Time: 06/30/20  3:49 PM  Result Value Ref Range   Glucose-Capillary 317 (H) 70 - 99 mg/dL    Comment: Glucose reference range applies only to samples taken after fasting for at least 8 hours.  Glucose, capillary     Status: Abnormal   Collection Time: 06/30/20  4:51 PM  Result Value Ref Range   Glucose-Capillary 358 (H) 70 - 99 mg/dL    Comment: Glucose reference range  applies only to samples taken after fasting for at least 8 hours.  Glucose, capillary     Status: Abnormal   Collection Time: 06/30/20  5:45 PM  Result Value Ref Range   Glucose-Capillary 410 (H) 70 - 99 mg/dL    Comment: Glucose reference range applies only to samples taken after fasting for at least 8 hours.  Glucose, capillary     Status: Abnormal   Collection Time: 06/30/20  8:19 PM  Result Value Ref Range   Glucose-Capillary 380 (H) 70 - 99 mg/dL    Comment: Glucose  reference range applies only to samples taken after fasting for at least 8 hours.  Glucose, capillary     Status: Abnormal   Collection Time: 07/01/20 12:32 AM  Result Value Ref Range   Glucose-Capillary 236 (H) 70 - 99 mg/dL    Comment: Glucose reference range applies only to samples taken after fasting for at least 8 hours.  CBC with Differential/Platelet     Status: Abnormal   Collection Time: 07/01/20  4:33 AM  Result Value Ref Range   WBC 9.8 4.0 - 10.5 K/uL   RBC 3.71 (L) 4.22 - 5.81 MIL/uL   Hemoglobin 10.4 (L) 13.0 - 17.0 g/dL   HCT 33.3 (L) 39 - 52 %   MCV 89.8 80.0 - 100.0 fL   MCH 28.0 26.0 - 34.0 pg   MCHC 31.2 30.0 - 36.0 g/dL   RDW 14.5 11.5 - 15.5 %   Platelets 232 150 - 400 K/uL   nRBC 0.0 0.0 - 0.2 %   Neutrophils Relative % 76 %   Neutro Abs 7.4 1.7 - 7.7 K/uL   Lymphocytes Relative 18 %   Lymphs Abs 1.8 0.7 - 4.0 K/uL   Monocytes Relative 5 %   Monocytes Absolute 0.5 0 - 1 K/uL   Eosinophils Relative 0 %   Eosinophils Absolute 0.0 0 - 0 K/uL   Basophils Relative 0 %   Basophils Absolute 0.0 0 - 0 K/uL   Immature Granulocytes 1 %   Abs Immature Granulocytes 0.10 (H) 0.00 - 0.07 K/uL    Comment: Performed at [facility], [place] [address] [place], [place] [address]  Comprehensive metabolic panel     Status: Abnormal   Collection Time: 07/01/20  4:33 AM  Result Value Ref Range   Sodium 136 135 - 145 mmol/L   Potassium 5.0 3.5 - 5.1 mmol/L    Comment: DELTA CHECK NOTED  NO VISIBLE HEMOLYSIS    Chloride 103 98 - 111 mmol/L   CO2 25 22 - 32 mmol/L   Glucose, Bld 276 (H) 70 - 99 mg/dL    Comment: Glucose reference range applies only to samples taken after fasting for at least 8 hours.   BUN 26 (H) 8 - 23 mg/dL   Creatinine, Ser 1.14 0.61 - 1.24 mg/dL   Calcium 8.4 (L) 8.9 - 10.3 mg/dL   Total Protein 5.3 (L) 6.5 - 8.1 g/dL   Albumin 2.9 (L) 3.5 - 5.0 g/dL   AST 20 15 - 41 U/L   ALT 39 0 - 44 U/L   Alkaline Phosphatase 69 38 - 126 U/L   Total Bilirubin 0.7 0.3 - 1.2 mg/dL   GFR calc non Af Amer >60 >60 mL/min   GFR calc Af Amer >60 >60 mL/min   Anion gap 8 5 - 15    Comment: Performed at [facility], [place] [address], [place], [place] [address]  Magnesium     Status: None   Collection Time: 07/01/20  4:33 AM  Result Value Ref Range   Magnesium 2.3 1.7 - 2.4 mg/dL    Comment: Performed at [facility], [place] [address], [place], [place] [address]  Phosphorus     Status: None   Collection Time: 07/01/20  4:33 AM  Result Value Ref Range   Phosphorus 3.7 2.5 - 4.6 mg/dL    Comment: Performed at [facility], [place] [address] [place], [place] [address]  Glucose, capillary     Status: Abnormal  Collection Time: 07/01/20  5:35 AM  Result Value Ref Range   Glucose-Capillary 260 (H) 70 - 99 mg/dL    Comment: Glucose reference range applies only to samples taken after fasting for at least 8 hours.  Glucose, capillary     Status: Abnormal   Collection Time: 07/01/20  7:46 AM  Result Value Ref Range   Glucose-Capillary 292 (H) 70 - 99 mg/dL    Comment: Glucose reference range applies only to samples taken after fasting for at least 8 hours.  Glucose, capillary     Status: Abnormal   Collection Time: 07/01/20 12:12 PM  Result Value Ref Range   Glucose-Capillary 300 (H) 70 - 99 mg/dL    Comment: Glucose reference range applies only to samples taken after fasting for at least 8 hours.  Glucose,  capillary     Status: Abnormal   Collection Time: 07/01/20 12:13 PM  Result Value Ref Range   Glucose-Capillary 298 (H) 70 - 99 mg/dL    Comment: Glucose reference range applies only to samples taken after fasting for at least 8 hours.    Current Facility-Administered Medications  Medication Dose Route Frequency Provider Last Rate Last Admin  . albuterol (PROVENTIL) (2.5 MG/3ML) 0.083% nebulizer solution 2.5 mg  2.5 mg Nebulization BID Elodia Florence., MD   2.5 mg at 07/01/20 0819  . albuterol (PROVENTIL) (2.5 MG/3ML) 0.083% nebulizer solution 2.5 mg  2.5 mg Nebulization Q6H PRN Elodia Florence., MD      . allopurinol (ZYLOPRIM) tablet 100 mg  100 mg Oral BID Rise Patience, MD   100 mg at 07/01/20 0851  . amLODipine (NORVASC) tablet 10 mg  10 mg Oral Daily Rise Patience, MD   10 mg at 07/01/20 0851  . arformoterol (BROVANA) nebulizer solution 15 mcg  15 mcg Nebulization BID Noe Gens L, NP   15 mcg at 07/01/20 0819  . budesonide (PULMICORT) nebulizer solution 0.5 mg  0.5 mg Nebulization BID Alfredo Martinez, Brandi L, NP   0.5 mg at 07/01/20 0819  . chlorpheniramine-HYDROcodone (TUSSIONEX) 10-8 MG/5ML suspension 5 mL  5 mL Oral Q12H PRN Elodia Florence., MD   5 mL at 06/29/20 1729  . clopidogrel (PLAVIX) tablet 75 mg  75 mg Oral Daily Rise Patience, MD   75 mg at 07/01/20 0851  . enoxaparin (LOVENOX) injection 40 mg  40 mg Subcutaneous Q24H Rise Patience, MD   40 mg at 07/01/20 0851  . fluticasone (FLONASE) 50 MCG/ACT nasal spray 2 spray  2 spray Each Nare Daily Rise Patience, MD   2 spray at 07/01/20 0816  . guaiFENesin (ROBITUSSIN) 100 MG/5ML solution 100 mg  5 mL Oral Q4H PRN Rise Patience, MD   100 mg at 06/29/20 2206  . hydrALAZINE (APRESOLINE) tablet 25 mg  25 mg Oral TID Rise Patience, MD   25 mg at 07/01/20 0851  . insulin aspart (novoLOG) injection 0-15 Units  0-15 Units Subcutaneous TID WC Elodia Florence., MD   8 Units  at 07/01/20 1236  . insulin aspart (novoLOG) injection 0-5 Units  0-5 Units Subcutaneous QHS Elodia Florence., MD   5 Units at 06/30/20 2143  . insulin regular human CONCENTRATED (HUMULIN R) 500 UNIT/ML kwikpen 65 Units  65 Units Subcutaneous TID WC Elodia Florence., MD   65 Units at 07/01/20 1236  . levothyroxine (SYNTHROID) tablet 150 mcg  150 mcg Oral Q0600 Rise Patience, MD  150 mcg at 07/01/20 0604  . LORazepam (ATIVAN) injection 0.25 mg  0.25 mg Intravenous Q6H PRN Elodia Florence., MD   0.25 mg at 06/29/20 0827  . multivitamin with minerals tablet 1 tablet  1 tablet Oral Daily Rise Patience, MD   1 tablet at 07/01/20 0850  . ondansetron (ZOFRAN) tablet 4 mg  4 mg Oral Q6H PRN Rise Patience, MD       Or  . ondansetron [facility]) injection 4 mg  4 mg Intravenous Q6H PRN Rise Patience, MD      . pantoprazole (PROTONIX) injection 40 mg  40 mg Intravenous Q24H Mannam, Praveen, MD   40 mg at 06/30/20 2106  . predniSONE (DELTASONE) tablet 40 mg  40 mg Oral Q breakfast Elodia Florence., MD   40 mg at 07/01/20 0851  . revefenacin (YUPELRI) nebulizer solution 175 mcg  175 mcg Nebulization Daily Ollis, Brandi L, NP   175 mcg at 07/01/20 0819  . rOPINIRole (REQUIP) tablet 0.25 mg  0.25 mg Oral QHS Rise Patience, MD   0.25 mg at 06/30/20 2105  . rosuvastatin (CRESTOR) tablet 20 mg  20 mg Oral Daily Rise Patience, MD   20 mg at 07/01/20 0850  . sertraline (ZOLOFT) tablet 25 mg  25 mg Oral Daily Ollis, Brandi L, NP   25 mg at 07/01/20 0850  . sodium chloride (OCEAN) 0.65 % nasal spray 1 spray  1 spray Each Nare BID Noe Gens L, NP   1 spray at 07/01/20 0816  . tamsulosin (FLOMAX) capsule 0.4 mg  0.4 mg Oral QPC supper Rise Patience, MD   0.4 mg at 06/30/20 1813    Musculoskeletal: Strength & Muscle Tone: decreased Gait & Station: Unable to assess Patient leans: N/A  Psychiatric Specialty Exam: Physical Exam  Review of  Systems  Blood pressure (!) 177/81, pulse 84, temperature 98.6 F (37 C), temperature source Oral, resp. rate 18, height 6' (1.829 m), weight 107.8 kg, SpO2 97 %.Body mass index is 32.23 kg/m.  General Appearance: Casual and Fairly Groomed  Eye Contact:  Good  Speech:  Clear and Coherent and Normal Rate  Volume:  Normal  Mood:  Euthymic  Affect:  Appropriate and Congruent  Thought Process:  Coherent, Goal Directed and Descriptions of Associations: Intact  Orientation:  Full (Time, Place, and Person)  Thought Content:  WDL and Logical  Suicidal Thoughts:  No  Homicidal Thoughts:  No  Memory:  Immediate;   Good Recent;   Good Remote;   Good  Judgement:  Good  Insight:  Good  Psychomotor Activity:  Normal  Concentration:  Concentration: Good and Attention Span: Good  Recall:  Good  Fund of Knowledge:  Good  Language:  Good  Akathisia:  No  Handed:  Right  AIMS (if indicated):     Assets:  Communication Skills Desire for Improvement Financial Resources/Insurance Housing Intimacy Leisure Time Resilience Social Support Talents/Skills Transportation  ADL's:  Intact  Cognition:  WNL  Sleep:        Treatment Plan Summary: Patient reviewed with Dr. Hampton Abbot.  Patient is a 74 year old male, pleasant and cooperative with assessment.  Patient currently denies any suicidal ideations, patient denies mood swings and psychosis. Patient endorses recent chronically depressed mood related to medical diagnoses.  Patient could benefit from follow-up with outpatient psychiatry.  Recommendation: Continue Zoloft 25 mg daily/depression.  Disposition: No evidence of imminent risk to self or others at present.  Patient does not meet criteria for psychiatric inpatient admission. Supportive therapy provided about ongoing stressors.  Emmaline Kluver, FNP 07/01/2020 2:11 PM

## 2020-07-01 NOTE — TOC Progression Note (Signed)
Transition of Care [facility]) - Progression Note    Patient Details  Name: Sean Saunders MRN: [id number] Date of Birth: [birth year]  [facility]) CM/SW Contact  Purcell Mouton, RN Phone Number: 07/01/2020, 10:53 AM  Clinical Narrative:    Spoke with pt he declined HHPT at present time. Pt states that he has family living with him and they will help him.    Expected Discharge Plan: Home/Self Care Barriers to Discharge: No Barriers Identified  Expected Discharge Plan and Services Expected Discharge Plan: Home/Self Care       Living arrangements for the past 2 months: Single Family Home                                       Social Determinants of Health (SDOH) Interventions    Readmission Risk Interventions No flowsheet data found.

## 2020-07-01 NOTE — Care Management Important Message (Signed)
Important Message  Patient Details IM Letter given to Gabriel Earing RN Case Manager to present to the Patient Name: BARNARD SHARPS MRN: [id number] Date of Birth: 07/30/1946   Medicare Important Message Given:  Yes     Kerin Salen 07/01/2020, 10:18 AM

## 2020-07-01 NOTE — Progress Notes (Signed)
Inpatient Diabetes Program Recommendations  AACE/ADA: New Consensus Statement on Inpatient Glycemic Control (2015)  Target Ranges:  Prepandial:   less than 140 mg/dL      Peak postprandial:   less than 180 mg/dL (1-2 hours)      Critically ill patients:  140 - 180 mg/dL   Lab Results  Component Value Date   GLUCAP 292 (H) 07/01/2020   HGBA1C 8.2 (H) 06/27/2020    Review of Glycemic Control  CBGs 260, 272 mg/dL this am. Orders starting 7/20 were U-500 65 units tid, which was decreased from 70 units tid, due to severe hypoglycemia.  Eating 100%. On Pred 40 mg QAM  Inpatient Diabetes Program Recommendations:     If pt is to go home on  Prednisone, consider increasing U-500 to 70 units tid. Pt's Endo allows pt to basically dose himself, depending on his oral intake.   Long discussion with pt regarding hypoglycemia s/s and treatment. Family present during discussion on 7/20.  Will continue to follow closely.  Thank you. Lorenda Peck, RD, LDN, CDE Inpatient Diabetes Coordinator [phone]

## 2020-07-01 NOTE — Progress Notes (Signed)
Physical Therapy Treatment Patient Details Name: Sean Saunders MRN: [id number] DOB: 01/06/1946 Today's Date: 07/01/2020    History of Present Illness 74 y.o. male with history of COPD, peripheral vascular disease, diabetes mellitus type 2, hypothyroidism gout and chronic kidney disease has been experiencing shortness of breath with wheezing for the last 2 weeks had gone to his primary care physician about 2 weeks ago when he was prescribed steroids and cough suppressants.  Pt admitted for Acute respiratory failure with hypoxia secondary to COPD exacerbation    PT Comments    Pt ambulated in hallway and mobility much improved today.  Pt reports feeling better and eager for d/c home hopefully today.  Follow Up Recommendations  Home health PT;Supervision for mobility/OOB     Equipment Recommendations  Rolling walker with 5" wheels    Recommendations for Other Services       Precautions / Restrictions Precautions Precautions: Fall    Mobility  Bed Mobility Overal bed mobility: Modified Independent                Transfers Overall transfer level: Needs assistance Equipment used: None Transfers: Sit to/from Stand Sit to Stand: Supervision            Ambulation/Gait Ambulation/Gait assistance: Supervision;Min guard Gait Distance (Feet): 350 Feet Assistive device: None Gait Pattern/deviations: Step-through pattern;Decreased stride length     General Gait Details: improved stability observed today has pt did not require assist for any unsteadiness or LOB, pt reports feeling better   Stairs             Wheelchair Mobility    Modified Rankin (Stroke Patients Only)       Balance                                            Cognition Arousal/Alertness: Awake/alert Behavior During Therapy: WFL for tasks assessed/performed Overall Cognitive Status: Within Functional Limits for tasks assessed                                         Exercises      General Comments        Pertinent Vitals/Pain Pain Assessment: No/denies pain    Home Living                      Prior Function            PT Goals (current goals can now be found in the care plan section) Progress towards PT goals: Progressing toward goals    Frequency    Min 3X/week      PT Plan Current plan remains appropriate    Co-evaluation              AM-PAC PT "6 Clicks" Mobility   Outcome Measure  Help needed turning from your back to your side while in a flat bed without using bedrails?: None Help needed moving from lying on your back to sitting on the side of a flat bed without using bedrails?: None Help needed moving to and from a bed to a chair (including a wheelchair)?: A Little Help needed standing up from a chair using your arms (e.g., wheelchair or bedside chair)?: A Little Help needed to walk in hospital room?: A Little  Help needed climbing 3-5 steps with a railing? : A Little 6 Click Score: 20    End of Session Equipment Utilized During Treatment: Gait belt Activity Tolerance: Patient tolerated treatment well Patient left: in bed;with call bell/phone within reach;with family/visitor present   PT Visit Diagnosis: Difficulty in walking, not elsewhere classified (R26.2)     Time: 0298-4730 PT Time Calculation (min) (ACUTE ONLY): 11 min  Charges:  $Gait Training: 8-22 mins                     Arlyce Dice, DPT Acute Rehabilitation Services Pager: [phone] Office: [place] 07/01/2020, 1:22 PM

## 2020-07-02 ENCOUNTER — Telehealth: Payer: Self-pay | Admitting: Family Medicine

## 2020-07-02 LAB — CULTURE, BLOOD (ROUTINE X 2)
Culture: NO GROWTH
Culture: NO GROWTH
Special Requests: ADEQUATE

## 2020-07-02 NOTE — Telephone Encounter (Signed)
Transition Care Management Follow-up Telephone Call  Date of discharge and from where: 07/01/2020 from [facility]  How have you been since you were released from the hospital? Patient stated that he has not felt that well. He is doing some better from the hospital but states his breathing is still not at baseline. He also has not been able to to pick up his advair due to the pharmacy being out of it but states he was told that it should be ready by the weekend   Any questions or concerns? Yes  he has concerns of possible needing oxygen therapy   Items Reviewed:  Did the pt receive and understand the discharge instructions provided? Yes   Medications obtained and verified? Yes   Any new allergies since your discharge? No   Dietary orders reviewed? Yes  Do you have support at home? Yes his daughter is staying with him for the next two weeks to assist him at home.  Functional Questionnaire: (I = Independent and D = Dependent) ADLs: I  Bathing/Dressing- I  Meal Prep- I  Eating- I  Maintaining continence- I  Transferring/Ambulation- I  Managing Meds- I  Follow up appointments reviewed:   PCP Hospital f/u appt confirmed? Yes  Scheduled to see Dr. Martinique on 07/13/2020 @ 2:45 PM.  [facility] f/u appt confirmed? Yes  Scheduled to see Dr. Royal Piedra on 07/16/2020 @ 2:00PM.  Are transportation arrangements needed? No   If their condition worsens, is the pt aware to call PCP or go to the Emergency Dept.? Yes  Was the patient provided with contact information for the PCP's office or ED? Yes  Was to pt encouraged to call back with questions or concerns? Yes

## 2020-07-07 ENCOUNTER — Inpatient Hospital Stay: Payer: Medicare HMO | Admitting: Primary Care

## 2020-07-07 DIAGNOSIS — N481 Balanitis: Secondary | ICD-10-CM | POA: Diagnosis not present

## 2020-07-07 DIAGNOSIS — A6000 Herpesviral infection of urogenital system, unspecified: Secondary | ICD-10-CM | POA: Diagnosis not present

## 2020-07-13 ENCOUNTER — Inpatient Hospital Stay: Payer: Medicare HMO | Admitting: Family Medicine

## 2020-07-16 ENCOUNTER — Encounter: Payer: Self-pay | Admitting: Primary Care

## 2020-07-16 ENCOUNTER — Telehealth: Payer: Self-pay | Admitting: Emergency Medicine

## 2020-07-16 ENCOUNTER — Ambulatory Visit (INDEPENDENT_AMBULATORY_CARE_PROVIDER_SITE_OTHER): Payer: Medicare HMO | Admitting: Primary Care

## 2020-07-16 ENCOUNTER — Inpatient Hospital Stay: Payer: Medicare HMO | Admitting: Adult Health

## 2020-07-16 ENCOUNTER — Other Ambulatory Visit: Payer: Self-pay

## 2020-07-16 VITALS — BP 144/60 | HR 76 | Temp 97.9°F | Ht 72.0 in | Wt 226.8 lb

## 2020-07-16 DIAGNOSIS — F329 Major depressive disorder, single episode, unspecified: Secondary | ICD-10-CM | POA: Diagnosis not present

## 2020-07-16 DIAGNOSIS — F32A Depression, unspecified: Secondary | ICD-10-CM

## 2020-07-16 DIAGNOSIS — J321 Chronic frontal sinusitis: Secondary | ICD-10-CM | POA: Diagnosis not present

## 2020-07-16 DIAGNOSIS — J449 Chronic obstructive pulmonary disease, unspecified: Secondary | ICD-10-CM | POA: Diagnosis not present

## 2020-07-16 MED ORDER — CETIRIZINE HCL 10 MG PO TABS
10.0000 mg | ORAL_TABLET | Freq: Every day | ORAL | 5 refills | Status: DC
Start: 2020-07-16 — End: 2021-01-11

## 2020-07-16 MED ORDER — TIOTROPIUM BROMIDE MONOHYDRATE 18 MCG IN CAPS
18.0000 ug | ORAL_CAPSULE | Freq: Every day | RESPIRATORY_TRACT | 5 refills | Status: DC
Start: 2020-07-16 — End: 2020-08-31

## 2020-07-16 NOTE — Assessment & Plan Note (Signed)
-   Recently admitted for COPD exacerbation, treated with 5 day course of abx/steriods. He continues to have some sinusitis symptoms and dyspnea on exertion.  - Continue Advair 115/21 1 puff twice daily and ADDING Spiriva handihaler 71mcg once daily - Refer to pulmonary rehab - Consider palliative care referral at next visit (he is not ready at this time but expressed interest) - FU in 3 months with Dr. Lamonte Sakai

## 2020-07-16 NOTE — Progress Notes (Signed)
@Patient  ID: Sean Saunders, male    DOB: 1946-05-14, 74 y.o.   MRN: [id number]  Chief Complaint  Patient presents with   Follow-up    PHOS x 1 wk.,nasal and chest congestion,sob with exertion,non-prod. cough    Referring provider: Martinique, Betty G, MD  HPI: 74 year old male, former smoker quit 2019 (twenty-five-pack-year history). Past medical history significant for COPD, acute respiratory failure with hypoxia, hypertension, arthrosclerosis, peripheral vascular disease, DVT, acute pancreatitis, chronic kidney disease stage III. Patient of Dr. Lamonte Sakai, last seen in office on 09/03/2019 for COPD exacerbation treated with prednisone taper, Augmentin and depo-medrol. Maintained on Trelegy and prn combivent.  07/16/2020 Patient presents today for hospital follow-up. He was admitted from 06/27/2019 1-7/21/2021 for acute respiratory failure with hypoxia due to COPD exacerbation/acute bronchitis. Patient completed 5 days of steroids and antibiotics. No infiltrate on CT chest. Patient was started on Advair per pulmonary recommendations.  Continues to have shortness of breath on exertion. He does not feel Advair has helped much. He gets out of breath bending over. He also has some sinus and chest congested. He is not able to walk as far as he used to.   He is not getting much activity at home. He is depressed over losing his wife. He has been taking Zoloft.       PFTs 2019- FVC 3.02 (64%), FEV1 1.80 (52%), ratio 59 Interpretation moderate-severe obstruction, no BD response.   Allergies  Allergen Reactions   Eszopiclone Itching   Niaspan [Niacin Er] Nausea And Vomiting and Rash    Immunization History  Administered Date(s) Administered   Fluad Quad(high Dose 65+) 08/13/2019   Influenza, High Dose Seasonal PF 09/25/2017, 09/17/2018   Influenza-Unspecified 11/11/2013   PFIZER SARS-COV-2 Vaccination 02/13/2020, 03/11/2020   Tdap 12/11/2018    Past Medical History:  Diagnosis Date    Arthritis    COPD (chronic obstructive pulmonary disease) ([place])    Diabetes mellitus    DVT (deep venous thrombosis) ([facility])    GERD (gastroesophageal reflux disease)    Hyperlipidemia    Hypertension    Hypothyroidism    Onychomycosis 06/07/2013   Stroke ([place])    TIA's 01/05/11-01/08/11-01/26/11   TIA (transient ischemic attack) 08/03/2011   Left sided weakness. 3 episodes Feb-March    Tobacco History: Social History   Tobacco Use  Smoking Status Former Smoker   Packs/day: 1.00   Years: 25.00   Pack years: 25.00   Types: Cigarettes   Quit date: 06/05/2018   Years since quitting: 2.1  Smokeless Tobacco Never Used   Counseling given: Not Answered   Outpatient Medications Prior to Visit  Medication Sig Dispense Refill   allopurinol (ZYLOPRIM) 100 MG tablet Take 100 mg by mouth 2 (two) times daily.     amLODipine (NORVASC) 10 MG tablet Take 10 mg by mouth daily.     clopidogrel (PLAVIX) 75 MG tablet Take 75 mg by mouth daily.       fluticasone (FLONASE) 50 MCG/ACT nasal spray Place 2 sprays into both nostrils daily. 16 g 5   fluticasone-salmeterol (ADVAIR HFA) 115-21 MCG/ACT inhaler Inhale 2 puffs into the lungs 2 (two) times daily. 1 Inhaler 0   furosemide (LASIX) 20 MG tablet Take 20 mg by mouth every other day.      hydrALAZINE (APRESOLINE) 50 MG tablet Take 50 mg by mouth every morning.     hydrochlorothiazide (HYDRODIURIL) 25 MG tablet Take 25 mg by mouth daily.     insulin regular human CONCENTRATED (HUMULIN R)  500 UNIT/ML injection Inject 110-140 Units into the skin See admin instructions. 110 units at breakfast, 140 units at lunch, 110 units at dinner     levothyroxine (SYNTHROID, LEVOTHROID) 150 MCG tablet Take 150 mcg by mouth daily before breakfast.     lisinopril (ZESTRIL) 40 MG tablet Take 40 mg by mouth daily.     metFORMIN (GLUCOPHAGE) 500 MG tablet Take 500 mg by mouth 2 (two) times daily with a meal.     Multiple Vitamin  (MULTIVITAMIN WITH MINERALS) TABS tablet Take 1 tablet by mouth daily.     rOPINIRole (REQUIP) 0.25 MG tablet Take 0.25 mg by mouth at bedtime.     rosuvastatin (CRESTOR) 40 MG tablet Take 20 mg by mouth daily.      sertraline (ZOLOFT) 25 MG tablet Take 1 tablet (25 mg total) by mouth daily. 30 tablet 0   tamsulosin (FLOMAX) 0.4 MG CAPS capsule Take 0.4 mg by mouth.     COMBIVENT RESPIMAT 20-100 MCG/ACT AERS respimat Inhale 2 puffs into the lungs 3 (three) times daily. (Patient not taking: Reported on 07/16/2020)     No facility-administered medications prior to visit.    Review of Systems  Review of Systems  Constitutional: Negative.   HENT: Positive for congestion and postnasal drip.   Respiratory: Positive for cough.        DOE  Cardiovascular: Negative.    Physical Exam  BP (!) 144/60 (BP Location: Left Arm, Cuff Size: Normal)    Pulse 76    Temp 97.9 F (36.6 C) (Oral)    Ht 6' (1.829 m)    Wt 226 lb 12.8 oz (102.9 kg)    SpO2 98%    BMI 30.76 kg/m  Physical Exam Constitutional:      Appearance: Normal appearance.  HENT:     Right Ear: There is impacted cerumen.     Left Ear: There is no impacted cerumen.     Mouth/Throat:     Mouth: Mucous membranes are moist.     Pharynx: Oropharynx is clear.  Cardiovascular:     Rate and Rhythm: Normal rate and regular rhythm.  Pulmonary:     Effort: Pulmonary effort is normal.     Breath sounds: Wheezing present.  Musculoskeletal:        General: Normal range of motion.  Neurological:     General: No focal deficit present.     Mental Status: He is alert and oriented to person, place, and time. Mental status is at baseline.  Psychiatric:        Behavior: Behavior normal.        Thought Content: Thought content normal.     Comments: Depressed mood      Lab Results:  CBC    Component Value Date/Time   WBC 9.8 07/01/2020 0433   RBC 3.71 (L) 07/01/2020 0433   HGB 10.4 (L) 07/01/2020 0433   HCT 33.3 (L) 07/01/2020 0433    PLT 232 07/01/2020 0433   MCV 89.8 07/01/2020 0433   MCH 28.0 07/01/2020 0433   MCHC 31.2 07/01/2020 0433   RDW 14.5 07/01/2020 0433   LYMPHSABS 1.8 07/01/2020 0433   MONOABS 0.5 07/01/2020 0433   EOSABS 0.0 07/01/2020 0433   BASOSABS 0.0 07/01/2020 0433    BMET    Component Value Date/Time   NA 136 07/01/2020 0433   K 5.0 07/01/2020 0433   CL 103 07/01/2020 0433   CO2 25 07/01/2020 0433   GLUCOSE 276 (H) 07/01/2020 8676  BUN 26 (H) 07/01/2020 0433   CREATININE 1.14 07/01/2020 0433   CREATININE 1.26 07/31/2013 1018   CALCIUM 8.4 (L) 07/01/2020 0433   GFRNONAA >60 07/01/2020 0433   GFRAA >60 07/01/2020 0433    BNP    Component Value Date/Time   BNP 98.3 06/27/2020 0509    ProBNP    Component Value Date/Time   PROBNP <30.0 01/24/2008 1537    Imaging: DG Chest 2 View  Result Date: 06/26/2020 CLINICAL DATA:  Cough. EXAM: CHEST - 2 VIEW COMPARISON:  December 31, 2019 FINDINGS: There is no evidence of acute infiltrate, pleural effusion or pneumothorax. The heart size and mediastinal contours are within normal limits. There is mild calcification of the aortic arch. The visualized skeletal structures are unremarkable. IMPRESSION: No active cardiopulmonary disease. Electronically Signed   By: Virgina Norfolk M.D.   On: 06/26/2020 22:37   CT CHEST WO CONTRAST  Result Date: 06/27/2020 CLINICAL DATA:  Cough and shortness of breath for the past 3 weeks. EXAM: CT CHEST WITHOUT CONTRAST TECHNIQUE: Multidetector CT imaging of the chest was performed following the standard protocol without IV contrast. COMPARISON:  Chest radiographs obtained yesterday. FINDINGS: Cardiovascular: Atheromatous calcifications, including the coronary arteries and aorta. Minimal pericardial effusion with a maximum thickness of 8 mm. Mediastinum/Nodes: No enlarged mediastinal or axillary lymph nodes. Thyroid gland, trachea, and esophagus demonstrate no significant findings. Lungs/Pleura: Mild bilateral  bullous changes in a centrilobular distribution. No airspace consolidation or pleural fluid. Upper Abdomen: Multiple small gallstones in the gallbladder measuring up to 7 mm in maximum diameter each. No gallbladder wall thickening or pericholecystic fluid. Musculoskeletal: Thoracic and lower cervical spine degenerative changes. IMPRESSION: 1. No acute abnormality. 2. Mild changes of COPD. 3. Minimal pericardial effusion. 4. Cholelithiasis. 5.  Calcific coronary artery and aortic atherosclerosis. Aortic Atherosclerosis (ICD10-I70.0) and Emphysema (ICD10-J43.9). Electronically Signed   By: Claudie Revering M.D.   On: 06/27/2020 14:26   DG CHEST PORT 1 VIEW  Result Date: 06/29/2020 CLINICAL DATA:  74 y.o male. SOB. History of COPD, peripheral vascular disease, diabetes mellitus type 2, hypothyroidism gout and chronic kidney disease has been experiencing shortness of breath with wheezing for the last 2 weeks EXAM: PORTABLE CHEST 1 VIEW COMPARISON:  Chest radiograph 06/28/2020 FINDINGS: Stable cardiomediastinal contours. Emphysema. No new consolidation. No pneumothorax or large pleural effusion. No acute finding in the visualized skeleton. IMPRESSION: No acute cardiopulmonary finding. Electronically Signed   By: Audie Pinto M.D.   On: 06/29/2020 08:29   DG CHEST PORT 1 VIEW  Result Date: 06/28/2020 CLINICAL DATA:  Acute respiratory failure in a patient with a history of COPD. Chest pain and shortness of breath. EXAM: PORTABLE CHEST 1 VIEW COMPARISON:  PA and lateral chest 06/26/2020.  CT chest 06/27/2020. FINDINGS: The lungs are emphysematous but clear. Heart size is normal. Aortic atherosclerosis. No pneumothorax or pleural fluid. No acute or focal bony abnormality. IMPRESSION: No acute disease. Aortic Atherosclerosis (ICD10-I70.0) and Emphysema (ICD10-J43.9). Electronically Signed   By: Inge Rise M.D.   On: 06/28/2020 17:37     Assessment & Plan:   COPD (chronic obstructive pulmonary disease)  ([place]) - Recently admitted for COPD exacerbation, treated with 5 day course of abx/steriods. He continues to have some sinusitis symptoms and dyspnea on exertion.  - Continue Advair 115/21 1 puff twice daily and ADDING Spiriva handihaler 11mcg once daily - Refer to pulmonary rehab - Consider palliative care referral at next visit (he is not ready at this time but expressed interest) -  FU in 3 months with Dr. Lamonte Sakai  Chronic sinusitis - Adding Zyrtec 10mg  daily and ocean nasal spray twice daily - Continue Flonase nasal spray once daily  Depression - Depressed mood, denies SI thoughts - Continue Zoloft 25mg  daily and follow up with PCP   Martyn Ehrich, NP 07/16/2020

## 2020-07-16 NOTE — Patient Instructions (Addendum)
Pleasure seeing you today Ms Bensinger  Recommendations: - Start Zyrtec 10mg  once daily (sent in RX) - Start nasal spray twice daily  - Continues Flonase once daily  - Continue Advair 1 puffs twice daily  - Continue Albuterol nebulizer twice a day  Referral: - Pulmonary rehab re: COPD GOLD III  Follow-up: - 3 month fu with Dr. Lamonte Sakai OR sooner

## 2020-07-16 NOTE — Addendum Note (Signed)
Addended by: Mathis Bud on: 07/16/2020 11:23 AM   Modules accepted: Orders

## 2020-07-16 NOTE — Assessment & Plan Note (Signed)
-   Adding Zyrtec 10mg  daily and ocean nasal spray twice daily - Continue Flonase nasal spray once daily

## 2020-07-16 NOTE — Assessment & Plan Note (Signed)
-   Depressed mood, denies SI thoughts - Continue Zoloft 25mg  daily and follow up with PCP

## 2020-07-16 NOTE — Telephone Encounter (Signed)
Pulmonary rehab requesting a corrected order for pulmonary rehab, referral placed.

## 2020-07-29 ENCOUNTER — Encounter (HOSPITAL_COMMUNITY): Payer: Self-pay | Admitting: *Deleted

## 2020-07-29 NOTE — Progress Notes (Signed)
Received referral from Dr. Lamonte Sakai for this pt to participate in pulmonary rehab with the the diagnosis of COPD.  Pt PFT in 2019 showed FEV1 post predicted 52 - Stage II COPD. Clinical review of pt follow up appt on 8/5 for post hospital follow up appt with Geraldo Pitter NP - Pulmonary office note.  Pt with Covid Risk Score - 7. Pt appropriate for scheduling for Pulmonary rehab.  Will forward to pulmonary rehab staff for scheduling  with pt consent. Cherre Huger, BSN Cardiac and Training and development officer

## 2020-08-04 ENCOUNTER — Telehealth (HOSPITAL_COMMUNITY): Payer: Self-pay | Admitting: *Deleted

## 2020-08-05 ENCOUNTER — Encounter (HOSPITAL_COMMUNITY): Payer: Self-pay | Admitting: *Deleted

## 2020-08-05 NOTE — Progress Notes (Signed)
Called pt to discuss Pulmonary Rehab referral. We discussed his co-pay, wearding a mask to participate, proper shoes and directions. He agrees and would like to participate in the program. I set his orientation for 8/31 at 8:45 and his class time will start 9/7 at 11:00.

## 2020-08-06 DIAGNOSIS — L603 Nail dystrophy: Secondary | ICD-10-CM | POA: Diagnosis not present

## 2020-08-06 DIAGNOSIS — I739 Peripheral vascular disease, unspecified: Secondary | ICD-10-CM | POA: Diagnosis not present

## 2020-08-06 DIAGNOSIS — L84 Corns and callosities: Secondary | ICD-10-CM | POA: Diagnosis not present

## 2020-08-06 DIAGNOSIS — E1151 Type 2 diabetes mellitus with diabetic peripheral angiopathy without gangrene: Secondary | ICD-10-CM | POA: Diagnosis not present

## 2020-08-11 ENCOUNTER — Other Ambulatory Visit: Payer: Self-pay

## 2020-08-11 ENCOUNTER — Encounter (HOSPITAL_COMMUNITY)
Admission: RE | Admit: 2020-08-11 | Discharge: 2020-08-11 | Disposition: A | Discharge status: Home / Self Care | Payer: Medicare HMO | Source: Ambulatory Visit | Attending: Emergency Medicine | Admitting: Emergency Medicine

## 2020-08-11 ENCOUNTER — Encounter (HOSPITAL_COMMUNITY): Payer: Self-pay

## 2020-08-11 VITALS — BP 126/62 | HR 81 | Wt 230.2 lb

## 2020-08-11 DIAGNOSIS — J449 Chronic obstructive pulmonary disease, unspecified: Secondary | ICD-10-CM | POA: Insufficient documentation

## 2020-08-11 NOTE — Progress Notes (Signed)
Pulmonary Individual Treatment Plan  Patient Details  Name: Sean Saunders MRN: [id number] Date of Birth: 25-Mar-1946 Referring Provider:     Pulmonary Rehab Walk Test from 08/11/2020 in [place]  Referring Provider Dr. Lamonte Sakai      Initial Encounter Date:    Pulmonary Rehab Walk Test from 08/11/2020 in [place]  Date 08/11/20      Visit Diagnosis: Chronic obstructive pulmonary disease, unspecified COPD type ([place])  Patient's Home Medications on Admission:   Current Outpatient Medications:  .  allopurinol (ZYLOPRIM) 100 MG tablet, Take 100 mg by mouth 2 (two) times daily., Disp: , Rfl:  .  amLODipine (NORVASC) 10 MG tablet, Take 10 mg by mouth daily., Disp: , Rfl:  .  cetirizine (ZYRTEC ALLERGY) 10 MG tablet, Take 1 tablet (10 mg total) by mouth daily., Disp: 30 tablet, Rfl: 5 .  clopidogrel (PLAVIX) 75 MG tablet, Take 75 mg by mouth daily.  , Disp: , Rfl:  .  COMBIVENT RESPIMAT 20-100 MCG/ACT AERS respimat, Inhale 2 puffs into the lungs 3 (three) times daily. (Patient not taking: Reported on 07/16/2020), Disp: , Rfl:  .  fluticasone (FLONASE) 50 MCG/ACT nasal spray, Place 2 sprays into both nostrils daily., Disp: 16 g, Rfl: 5 .  fluticasone-salmeterol (ADVAIR HFA) 115-21 MCG/ACT inhaler, Inhale 2 puffs into the lungs 2 (two) times daily., Disp: 1 Inhaler, Rfl: 0 .  furosemide (LASIX) 20 MG tablet, Take 20 mg by mouth every other day. , Disp: , Rfl:  .  hydrALAZINE (APRESOLINE) 50 MG tablet, Take 50 mg by mouth every morning., Disp: , Rfl:  .  hydrochlorothiazide (HYDRODIURIL) 25 MG tablet, Take 25 mg by mouth daily., Disp: , Rfl:  .  insulin regular human CONCENTRATED (HUMULIN R) 500 UNIT/ML injection, Inject 110-140 Units into the skin See admin instructions. 110 units at breakfast, 140 units at lunch, 110 units at dinner, Disp: , Rfl:  .  levothyroxine (SYNTHROID, LEVOTHROID) 150 MCG tablet, Take 150 mcg by mouth daily  before breakfast., Disp: , Rfl:  .  lisinopril (ZESTRIL) 40 MG tablet, Take 40 mg by mouth daily., Disp: , Rfl:  .  metFORMIN (GLUCOPHAGE) 500 MG tablet, Take 500 mg by mouth 2 (two) times daily with a meal., Disp: , Rfl:  .  Multiple Vitamin (MULTIVITAMIN WITH MINERALS) TABS tablet, Take 1 tablet by mouth daily., Disp: , Rfl:  .  rOPINIRole (REQUIP) 0.25 MG tablet, Take 0.25 mg by mouth at bedtime., Disp: , Rfl:  .  rosuvastatin (CRESTOR) 40 MG tablet, Take 20 mg by mouth daily. , Disp: , Rfl:  .  sertraline (ZOLOFT) 25 MG tablet, Take 1 tablet (25 mg total) by mouth daily. (Patient not taking: Reported on 08/11/2020), Disp: 30 tablet, Rfl: 0 .  tamsulosin (FLOMAX) 0.4 MG CAPS capsule, Take 0.4 mg by mouth., Disp: , Rfl:  .  tiotropium (SPIRIVA) 18 MCG inhalation capsule, Place 1 capsule (18 mcg total) into inhaler and inhale daily., Disp: 30 capsule, Rfl: 5  Past Medical History: Past Medical History:  Diagnosis Date  . Arthritis   . COPD (chronic obstructive pulmonary disease) ([place])   . Diabetes mellitus   . DVT (deep venous thrombosis) ([place])   . GERD (gastroesophageal reflux disease)   . Hyperlipidemia   . Hypertension   . Hypothyroidism   . Onychomycosis 06/07/2013  . Stroke [facility])    TIA's 01/05/11-01/08/11-01/26/11  . TIA (transient ischemic attack) 08/03/2011   Left sided weakness.  3 episodes Feb-March    Tobacco Use: Social History   Tobacco Use  Smoking Status Former Smoker  . Packs/day: 1.00  . Years: 25.00  . Pack years: 25.00  . Types: Cigarettes  . Quit date: 06/05/2018  . Years since quitting: 2.1  Smokeless Tobacco Never Used    Labs: Recent Chemical engineer    Labs for ITP Cardiac and Pulmonary Rehab Latest Ref Rng & Units 06/04/2012 12/31/2019 01/01/2020 06/27/2020 06/29/2020   Cholestrol 0 - 200 mg/dL - - - - -   LDLCALC 0 - 99 mg/dL - - - - -   HDL >39 mg/dL - - - - -   Trlycerides <150 mg/dL - 236(H) 194(H) - -   Hemoglobin A1c 4.8 - 5.6 % - 7.7(H) -  8.2(H) -   PHART 7.35 - 7.45 - - - - 7.437   PCO2ART 32 - 48 mmHg - - - - 37.3   HCO3 20.0 - 28.0 mmol/L - - - - 24.8   TCO2 0 - 100 mmol/L 21 - - - -   O2SAT % - - - - 96.3      Capillary Blood Glucose: Lab Results  Component Value Date   GLUCAP 298 (H) 07/01/2020   GLUCAP 300 (H) 07/01/2020   GLUCAP 292 (H) 07/01/2020   GLUCAP 260 (H) 07/01/2020   GLUCAP 236 (H) 07/01/2020    POCT Glucose    Row Name 08/11/20 1510             POCT Blood Glucose   Pre-Exercise 246 mg/dL              Pulmonary Assessment Scores:  Pulmonary Assessment Scores    Row Name 08/11/20 1157 08/11/20 1543       ADL UCSD   SOB Score total 58 --      CAT Score   CAT Score 21 --      mMRC Score   mMRC Score -- 4          UCSD: Self-administered rating of dyspnea associated with activities of daily living (ADLs) 6-point scale (0 = "not at all" to 5 = "maximal or unable to do because of breathlessness")  Scoring Scores range from 0 to 120.  Minimally important difference is 5 units  CAT: CAT can identify the health impairment of COPD patients and is better correlated with disease progression.  CAT has a scoring range of zero to 40. The CAT score is classified into four groups of low (less than 10), medium (10 - 20), high (21-30) and very high (31-40) based on the impact level of disease on health status. A CAT score over 10 suggests significant symptoms.  A worsening CAT score could be explained by an exacerbation, poor medication adherence, poor inhaler technique, or progression of COPD or comorbid conditions.  CAT MCID is 2 points  mMRC: mMRC (Modified Medical Research Council) Dyspnea Scale is used to assess the degree of baseline functional disability in patients of respiratory disease due to dyspnea. No minimal important difference is established. A decrease in score of 1 point or greater is considered a positive change.   Pulmonary Function Assessment:  Pulmonary Function  Assessment - 08/11/20 1508      Breath   Bilateral Breath Sounds Rhonchi;Decreased   A few scatteried rhonchi and decreased breath sounds throughout   Shortness of Breath Yes;Limiting activity           Exercise Target Goals: Exercise Program Goal: Individual  exercise prescription set using results from initial 6 min walk test and THRR while considering  patient's activity barriers and safety.   Exercise Prescription Goal: Initial exercise prescription builds to 30-45 minutes a day of aerobic activity, 2-3 days per week.  Home exercise guidelines will be given to patient during program as part of exercise prescription that the participant will acknowledge.  Activity Barriers & Risk Stratification:  Activity Barriers & Cardiac Risk Stratification - 08/11/20 0920      Activity Barriers & Cardiac Risk Stratification   Activity Barriers Right Hip Replacement;Joint Problems;Deconditioning;Shortness of Breath;Muscular Weakness    Cardiac Risk Stratification High           6 Minute Walk:  6 Minute Walk    Row Name 08/11/20 1543         6 Minute Walk   Distance 681 feet     Walk Time 6 minutes     # of Rest Breaks 1  pt had to stop at minute 4 due to severe shortness of breath and feeling light headed     MPH 1.29     METS 1.59     RPE 12     Perceived Dyspnea  3     VO2 Peak 5.56     Symptoms Yes (comment)     Comments pt started becoming wobbly and unstable around the 4th minute of the walk test. Had him sit down at that time to check vitals and asses how he was feeling. Pt stated he was very short of breath and finally admitted to being somewhat light headed. we monitored his vitals and everything was normal. pt stopped walk test at 4 minutes.     Resting HR 81 bpm     Resting BP 126/62     Resting Oxygen Saturation  97 %     Exercise Oxygen Saturation  during 6 min walk 96 %     Max Ex. HR 97 bpm     Max Ex. BP 164/54     2 Minute Post BP 132/58       Interval HR   1  Minute HR 91     2 Minute HR 96     3 Minute HR 97     4 Minute HR 96     5 Minute HR 88     6 Minute HR 88     2 Minute Post HR 84     Interval Heart Rate? Yes       Interval Oxygen   Interval Oxygen? Yes     Baseline Oxygen Saturation % 97 %     1 Minute Oxygen Saturation % 96 %     1 Minute Liters of Oxygen 0 L     2 Minute Oxygen Saturation % 97 %     2 Minute Liters of Oxygen 0 L     3 Minute Oxygen Saturation % 97 %     3 Minute Liters of Oxygen 0 L     4 Minute Oxygen Saturation % 98 %     4 Minute Liters of Oxygen 0 L     5 Minute Oxygen Saturation % 98 %     5 Minute Liters of Oxygen 0 L     6 Minute Oxygen Saturation % 98 %     6 Minute Liters of Oxygen 0 L     2 Minute Post Oxygen Saturation % 98 %     2 Minute Post  Liters of Oxygen 0 L            Oxygen Initial Assessment:  Oxygen Initial Assessment - 08/11/20 0921      Home Oxygen   Home Oxygen Device None    Sleep Oxygen Prescription None    Home Exercise Oxygen Prescription None    Home at Rest Exercise Oxygen Prescription None    Compliance with Home Oxygen Use Yes           Oxygen Re-Evaluation:   Oxygen Discharge (Final Oxygen Re-Evaluation):   Initial Exercise Prescription:  Initial Exercise Prescription - 08/11/20 1500      Date of Initial Exercise RX and Referring Provider   Date 08/11/20    Referring Provider Dr. Lamonte Sakai    Expected Discharge Date 10/08/20      NuStep   Level 2    SPM 80    Minutes 30    METs 1.6      Prescription Details   Frequency (times per week) 2    Duration Progress to 30 minutes of continuous aerobic without signs/symptoms of physical distress      Intensity   THRR 40-80% of Max Heartrate 58-116    Ratings of Perceived Exertion 11-13    Perceived Dyspnea 0-4      Progression   Progression Continue to progress workloads to maintain intensity without signs/symptoms of physical distress.      Resistance Training   Training Prescription Yes     Weight blue bands    Reps 10-15           Perform Capillary Blood Glucose checks as needed.  Exercise Prescription Changes:   Exercise Comments:   Exercise Goals and Review:  Exercise Goals    Row Name 08/11/20 1550             Exercise Goals   Increase Physical Activity Yes       Intervention Provide advice, education, support and counseling about physical activity/exercise needs.;Develop an individualized exercise prescription for aerobic and resistive training based on initial evaluation findings, risk stratification, comorbidities and participant's personal goals.       Expected Outcomes Short Term: Attend rehab on a regular basis to increase amount of physical activity.;Long Term: Add in home exercise to make exercise part of routine and to increase amount of physical activity.;Long Term: Exercising regularly at least 3-5 days a week.       Increase Strength and Stamina Yes       Intervention Provide advice, education, support and counseling about physical activity/exercise needs.;Develop an individualized exercise prescription for aerobic and resistive training based on initial evaluation findings, risk stratification, comorbidities and participant's personal goals.       Expected Outcomes Short Term: Increase workloads from initial exercise prescription for resistance, speed, and METs.;Short Term: Perform resistance training exercises routinely during rehab and add in resistance training at home;Long Term: Improve cardiorespiratory fitness, muscular endurance and strength as measured by increased METs and functional capacity (6MWT)       Able to understand and use rate of perceived exertion (RPE) scale Yes       Intervention Provide education and explanation on how to use RPE scale       Expected Outcomes Short Term: Able to use RPE daily in rehab to express subjective intensity level;Long Term:  Able to use RPE to guide intensity level when exercising independently       Able  to understand and use Dyspnea scale Yes  Intervention Provide education and explanation on how to use Dyspnea scale       Expected Outcomes Short Term: Able to use Dyspnea scale daily in rehab to express subjective sense of shortness of breath during exertion;Long Term: Able to use Dyspnea scale to guide intensity level when exercising independently       Knowledge and understanding of Target Heart Rate Range (THRR) Yes       Intervention Provide education and explanation of THRR including how the numbers were predicted and where they are located for reference       Expected Outcomes Short Term: Able to state/look up THRR;Long Term: Able to use THRR to govern intensity when exercising independently;Short Term: Able to use daily as guideline for intensity in rehab       Understanding of Exercise Prescription Yes       Intervention Provide education, explanation, and written materials on patient's individual exercise prescription       Expected Outcomes Short Term: Able to explain program exercise prescription;Long Term: Able to explain home exercise prescription to exercise independently              Exercise Goals Re-Evaluation :   Discharge Exercise Prescription (Final Exercise Prescription Changes):   Nutrition:  Target Goals: Understanding of nutrition guidelines, daily intake of sodium 1500mg , cholesterol 200mg , calories 30% from fat and 7% or less from saturated fats, daily to have 5 or more servings of fruits and vegetables.  Biometrics:  Pre Biometrics - 08/11/20 1510      Pre Biometrics   Grip Strength 41 kg            Nutrition Therapy Plan and Nutrition Goals:   Nutrition Assessments:   Nutrition Goals Re-Evaluation:   Nutrition Goals Discharge (Final Nutrition Goals Re-Evaluation):   Psychosocial: Target Goals: Acknowledge presence or absence of significant depression and/or stress, maximize coping skills, provide positive support system. Participant  is able to verbalize types and ability to use techniques and skills needed for reducing stress and depression.  Initial Review & Psychosocial Screening:  Initial Psych Review & Screening - 08/11/20 0923      Initial Review   Current issues with Current Depression      Family Dynamics   Good Support System? Yes    Concerns Recent loss of significant other      Barriers   Psychosocial barriers to participate in program The patient should benefit from training in stress management and relaxation.      Screening Interventions   Interventions Encouraged to exercise    Expected Outcomes Long Term Goal: Stressors or current issues are controlled or eliminated.;Short Term goal: Identification and review with participant of any Quality of Life or Depression concerns found by scoring the questionnaire.;Long Term goal: The participant improves quality of Life and PHQ9 Scores as seen by post scores and/or verbalization of changes           Quality of Life Scores:  Scores of 19 and below usually indicate a poorer quality of life in these areas.  A difference of  2-3 points is a clinically meaningful difference.  A difference of 2-3 points in the total score of the Quality of Life Index has been associated with significant improvement in overall quality of life, self-image, physical symptoms, and general health in studies assessing change in quality of life.  PHQ-9: Recent Review Flowsheet Data    Depression screen [facility] 2/9 08/11/2020 09/17/2018   Decreased Interest 3  0  Down, Depressed, Hopeless 2 0   PHQ - 2 Score 5 0   Altered sleeping 0 -   Tired, decreased energy 3 -   Change in appetite 3 -   Feeling bad or failure about yourself  0 -   Trouble concentrating 0 -   Moving slowly or fidgety/restless 0 -   Suicidal thoughts 0 -   PHQ-9 Score 11 -   Difficult doing work/chores Not difficult at all -     Interpretation of Total Score  Total Score Depression Severity:  1-4 = Minimal  depression, 5-9 = Mild depression, 10-14 = Moderate depression, 15-19 = Moderately severe depression, 20-27 = Severe depression   Psychosocial Evaluation and Intervention:   Psychosocial Re-Evaluation:   Psychosocial Discharge (Final Psychosocial Re-Evaluation):   Education: Education Goals: Education classes will be provided on a weekly basis, covering required topics. Participant will state understanding/return demonstration of topics presented.  Learning Barriers/Preferences:  Learning Barriers/Preferences - 08/11/20 0925      Learning Barriers/Preferences   Learning Barriers Sight;Hearing    Learning Preferences Written Material;Computer/Internet           Education Topics: Risk Factor Reduction:  -Group instruction that is supported by a PowerPoint presentation. Instructor discusses the definition of a risk factor, different risk factors for pulmonary disease, and how the heart and lungs work together.     Nutrition for Pulmonary Patient:  -Group instruction provided by PowerPoint slides, verbal discussion, and written materials to support subject matter. The instructor gives an explanation and review of healthy diet recommendations, which includes a discussion on weight management, recommendations for fruit and vegetable consumption, as well as protein, fluid, caffeine, fiber, sodium, sugar, and alcohol. Tips for eating when patients are short of breath are discussed.   Pursed Lip Breathing:  -Group instruction that is supported by demonstration and informational handouts. Instructor discusses the benefits of pursed lip and diaphragmatic breathing and detailed demonstration on how to preform both.     Oxygen Safety:  -Group instruction provided by PowerPoint, verbal discussion, and written material to support subject matter. There is an overview of "What is Oxygen" and "Why do we need it".  Instructor also reviews how to create a safe environment for oxygen use, the  importance of using oxygen as prescribed, and the risks of noncompliance. There is a brief discussion on traveling with oxygen and resources the patient may utilize.   Oxygen Equipment:  -Group instruction provided by [facility] Staff utilizing handouts, written materials, and equipment demonstrations.   Signs and Symptoms:  -Group instruction provided by written material and verbal discussion to support subject matter. Warning signs and symptoms of infection, stroke, and heart attack are reviewed and when to call the physician/911 reinforced. Tips for preventing the spread of infection discussed.   Advanced Directives:  -Group instruction provided by verbal instruction and written material to support subject matter. Instructor reviews Advanced Directive laws and proper instruction for filling out document.   Pulmonary Video:  -Group video education that reviews the importance of medication and oxygen compliance, exercise, good nutrition, pulmonary hygiene, and pursed lip and diaphragmatic breathing for the pulmonary patient.   Exercise for the Pulmonary Patient:  -Group instruction that is supported by a PowerPoint presentation. Instructor discusses benefits of exercise, core components of exercise, frequency, duration, and intensity of an exercise routine, importance of utilizing pulse oximetry during exercise, safety while exercising, and options of places to exercise outside of rehab.     Pulmonary Medications:  -  Verbally interactive group education provided by instructor with focus on inhaled medications and proper administration.   Anatomy and Physiology of the Respiratory System and Intimacy:  -Group instruction provided by PowerPoint, verbal discussion, and written material to support subject matter. Instructor reviews respiratory cycle and anatomical components of the respiratory system and their functions. Instructor also reviews differences in obstructive and restrictive  respiratory diseases with examples of each. Intimacy, Sex, and Sexuality differences are reviewed with a discussion on how relationships can change when diagnosed with pulmonary disease. Common sexual concerns are reviewed.   MD DAY -A group question and answer session with a medical doctor that allows participants to ask questions that relate to their pulmonary disease state.   OTHER EDUCATION -Group or individual verbal, written, or video instructions that support the educational goals of the pulmonary rehab program.   Holiday Eating Survival Tips:  -Group instruction provided by PowerPoint slides, verbal discussion, and written materials to support subject matter. The instructor gives patients tips, tricks, and techniques to help them not only survive but enjoy the holidays despite the onslaught of food that accompanies the holidays.   Knowledge Questionnaire Score:  Knowledge Questionnaire Score - 08/11/20 1156      Knowledge Questionnaire Score   Pre Score 13/18           Core Components/Risk Factors/Patient Goals at Admission:  Personal Goals and Risk Factors at Admission - 08/11/20 0926      Core Components/Risk Factors/Patient Goals on Admission    Weight Management Weight Loss    Goal Weight: Long Term 200 lb (90.7 kg)    Improve shortness of breath with ADL's Yes    Intervention Provide education, individualized exercise plan and daily activity instruction to help decrease symptoms of SOB with activities of daily living.    Expected Outcomes Short Term: Improve cardiorespiratory fitness to achieve a reduction of symptoms when performing ADLs;Long Term: Be able to perform more ADLs without symptoms or delay the onset of symptoms    Diabetes Yes    Intervention Provide education about signs/symptoms and action to take for hypo/hyperglycemia.;Provide education about proper nutrition, including hydration, and aerobic/resistive exercise prescription along with prescribed  medications to achieve blood glucose in normal ranges: Fasting glucose 65-99 mg/dL    Expected Outcomes Short Term: Participant verbalizes understanding of the signs/symptoms and immediate care of hyper/hypoglycemia, proper foot care and importance of medication, aerobic/resistive exercise and nutrition plan for blood glucose control.;Long Term: Attainment of HbA1C < 7%.    Stress Yes    Intervention Refer participants experiencing significant psychosocial distress to appropriate mental health specialists for further evaluation and treatment. When possible, include family members and significant others in education/counseling sessions.;Offer individual and/or small group education and counseling on adjustment to heart disease, stress management and health-related lifestyle change. Teach and support self-help strategies.    Expected Outcomes Short Term: Participant demonstrates changes in health-related behavior, relaxation and other stress management skills, ability to obtain effective social support, and compliance with psychotropic medications if prescribed.;Long Term: Emotional wellbeing is indicated by absence of clinically significant psychosocial distress or social isolation.           Core Components/Risk Factors/Patient Goals Review:    Core Components/Risk Factors/Patient Goals at Discharge (Final Review):    ITP Comments:   Comments:

## 2020-08-11 NOTE — Progress Notes (Signed)
Pulmonary Rehab Orientation Physical Assessment Note  Physical assessment reveals heart rate is normal, breath sounds  mild rhonchi hear and decreased sounds throughout Grip strength equal, strong. Distal pulses palpable. Pt is looking forward to exercise and learning how to manage his pulmonary condition. We are looking forward to helping pt meet his pulmonary rehab goals.

## 2020-08-11 NOTE — Progress Notes (Signed)
Sean Saunders 74 y.o. male Pulmonary Rehab Orientation Note This patient who was referred to Pulmonary rehab by Dr. Lamonte Sakai with the diagnosis of COPD arrived today in Cardiac and Pulmonary Rehab. He parked in the [place] parking deck and walked in the [facility] entrance with moderate shortness of breath. He does not carry portable oxygen. Per pt, he uses oxygen never. Color good, skin warm and dry. Patient is oriented to time and place. Patient's medical history, psychosocial health, and medications reviewed. Psychosocial assessment reveals pt lives alone, but his daughter and grandchild visit him frequently. Pt is currently retired. He served 21 years in the [employer]. Pt hobbies include building computers, playing with his cats, and sitting by his pool. Pt reports his stress level is moderate. Areas of stress/anxiety include Health. He can no longer do the things he once did due to his shortness of breath. Pt also has diabetes which is poorly controlled, and greatly affects his eating habits. Pt exhibits signs of depression. Signs of depression include sadness and fatigue. PHQ2/9 score 5/6. Pt shows fair  coping skills with a neutral outlook. His wife died about a year ago and he is still grieving from this. He was hospitalized last month and reports that he thought he was going to die. He was prescribed Zoloft for depression while hospitalized, but has ran out of the medication and has to see a physician to get refilled; however, he reports that he is not interested in getting a refill of Zoloft. He states that he will not take it again. I offered to  Refer him to a mental health expert, but he declined.  Pt offered emotional support and reassurance. Will continue to monitor and evaluate progress toward psychosocial goal(s) of managing his stress. Patient reports he does take medications as prescribed with the exception of Zoloft. Patient states he follows a Diabetic diet, but his CBGs are often above 300 in the  morning and over 200 throughout the day. The patient reports no specific efforts to gain or lose weight.. Patient's weight will be monitored closely. Demonstration and practice of PLB using pulse oximeter. Patient able to return demonstration satisfactorily. Safety and hand hygiene in the exercise area reviewed with patient. Patient voices understanding of the information reviewed. Department expectations discussed with patient and achievable goals were set. The patient shows enthusiasm about attending the program and we look forward to working with this nice man. He completed a six minute walk test today and will begin exercise 9/7 in the 1100 class time.  45 minutes was spent on a variety of activities such as assessment of the patient, obtaining baseline data including height, weight, BMI, and grip strength, verifying medical history, allergies, and current medications, and teaching patient strategies for performing tasks with less respiratory effort with emphasis on pursed lip breathing. [id number]

## 2020-08-18 ENCOUNTER — Encounter (HOSPITAL_COMMUNITY)
Admission: RE | Admit: 2020-08-18 | Discharge: 2020-08-18 | Disposition: A | Discharge status: Home / Self Care | Payer: Medicare HMO | Source: Ambulatory Visit | Attending: Emergency Medicine | Admitting: Emergency Medicine

## 2020-08-18 ENCOUNTER — Other Ambulatory Visit: Payer: Self-pay

## 2020-08-18 VITALS — Wt 231.0 lb

## 2020-08-18 DIAGNOSIS — J449 Chronic obstructive pulmonary disease, unspecified: Secondary | ICD-10-CM | POA: Diagnosis not present

## 2020-08-18 NOTE — Progress Notes (Signed)
Daily Session Note  Patient Details  Name: Sean Saunders MRN: [id number] Date of Birth: 1945/12/29 Referring Provider:     Pulmonary Rehab Walk Test from 08/11/2020 in [place]  Referring Provider Dr. Lamonte Sakai      Encounter Date: 08/18/2020  Check In:  Session Check In - 08/18/20 1150      Check-In   Supervising physician immediately available to respond to emergencies [facility] immediately available    Physician(s) Dr. Cathlean Sauer    Location [facility]    Staff Present Maurice Small, RN, BSN;Chrystle Murillo Ysidro Evert, RN;David [place], MS, EP-C, CCRP;Jessica Hassell Done, MS, ACSM-CEP, Exercise Physiologist    Virtual Visit No    Medication changes reported     No    Fall or balance concerns reported    No    Tobacco Cessation No Change    Warm-up and Cool-down Performed on first and last piece of equipment    Resistance Training Performed Yes    VAD Patient? No    PAD/SET Patient? No      Pain Assessment   Currently in Pain? No/denies    Multiple Pain Sites No           Capillary Blood Glucose: No results found for this or any previous visit (from the past 24 hour(s)).  POCT Glucose - 08/18/20 1233      POCT Blood Glucose   Pre-Exercise 196 mg/dL   Fasting 250   Post-Exercise 165 mg/dL           Exercise Prescription Changes - 08/18/20 1200      Response to Exercise   Blood Pressure (Admit) 158/62    Blood Pressure (Exercise) 134/70    Blood Pressure (Exit) 130/60    Heart Rate (Admit) 86 bpm    Heart Rate (Exercise) 86 bpm    Heart Rate (Exit) 83 bpm    Oxygen Saturation (Admit) 98 %    Oxygen Saturation (Exercise) 98 %    Oxygen Saturation (Exit) 99 %    Rating of Perceived Exertion (Exercise) 11    Perceived Dyspnea (Exercise) 1    Duration Progress to 30 minutes of  aerobic without signs/symptoms of physical distress    Intensity Other (comment)   40-80% of HRR     Progression   Progression Continue to  progress workloads to maintain intensity without signs/symptoms of physical distress.      Resistance Training   Training Prescription No    Weight blue bands    Reps 10-15    Time 10 Minutes      NuStep   Level 1    SPM 80    Minutes 30    METs 1.8           Social History   Tobacco Use  Smoking Status Former Smoker  . Packs/day: 1.00  . Years: 25.00  . Pack years: 25.00  . Types: Cigarettes  . Quit date: 06/05/2018  . Years since quitting: 2.2  Smokeless Tobacco Never Used    Goals Met:  Personal goals reviewed No report of cardiac concerns or symptoms Strength training completed today  Goals Unmet:  Not Applicable  Comments: Service time is from 1040 to 1150    Dr. Fransico Him is Medical Director for Cardiac Rehab at [facility].

## 2020-08-19 ENCOUNTER — Encounter (HOSPITAL_COMMUNITY): Payer: Self-pay

## 2020-08-20 ENCOUNTER — Encounter (HOSPITAL_COMMUNITY)
Admission: RE | Admit: 2020-08-20 | Discharge: 2020-08-20 | Disposition: A | Discharge status: Home / Self Care | Payer: Medicare HMO | Source: Ambulatory Visit | Attending: Emergency Medicine | Admitting: Emergency Medicine

## 2020-08-20 ENCOUNTER — Other Ambulatory Visit: Payer: Self-pay

## 2020-08-20 DIAGNOSIS — J449 Chronic obstructive pulmonary disease, unspecified: Secondary | ICD-10-CM

## 2020-08-20 LAB — GLUCOSE, CAPILLARY
Glucose-Capillary: 137 mg/dL — ABNORMAL HIGH (ref 70–99)
Glucose-Capillary: 86 mg/dL (ref 70–99)

## 2020-08-20 NOTE — Progress Notes (Signed)
Sean Saunders 74 y.o. male Nutrition Note  Visit Diagnosis: Chronic obstructive pulmonary disease, unspecified COPD type ([place])  Past Medical History:  Diagnosis Date  . Arthritis   . COPD (chronic obstructive pulmonary disease) ([place])   . Diabetes mellitus   . DVT (deep venous thrombosis) ([place])   . GERD (gastroesophageal reflux disease)   . Hyperlipidemia   . Hypertension   . Hypothyroidism   . Onychomycosis 06/07/2013  . Stroke [facility])    TIA's 01/05/11-01/08/11-01/26/11  . TIA (transient ischemic attack) 08/03/2011   Left sided weakness. 3 episodes Feb-March     Medications reviewed.   Current Outpatient Medications:  .  allopurinol (ZYLOPRIM) 100 MG tablet, Take 100 mg by mouth 2 (two) times daily., Disp: , Rfl:  .  amLODipine (NORVASC) 10 MG tablet, Take 10 mg by mouth daily., Disp: , Rfl:  .  cetirizine (ZYRTEC ALLERGY) 10 MG tablet, Take 1 tablet (10 mg total) by mouth daily., Disp: 30 tablet, Rfl: 5 .  clopidogrel (PLAVIX) 75 MG tablet, Take 75 mg by mouth daily.  , Disp: , Rfl:  .  COMBIVENT RESPIMAT 20-100 MCG/ACT AERS respimat, Inhale 2 puffs into the lungs 3 (three) times daily. (Patient not taking: Reported on 07/16/2020), Disp: , Rfl:  .  fluticasone (FLONASE) 50 MCG/ACT nasal spray, Place 2 sprays into both nostrils daily., Disp: 16 g, Rfl: 5 .  fluticasone-salmeterol (ADVAIR HFA) 115-21 MCG/ACT inhaler, Inhale 2 puffs into the lungs 2 (two) times daily., Disp: 1 Inhaler, Rfl: 0 .  furosemide (LASIX) 20 MG tablet, Take 20 mg by mouth every other day. , Disp: , Rfl:  .  hydrALAZINE (APRESOLINE) 50 MG tablet, Take 50 mg by mouth every morning., Disp: , Rfl:  .  hydrochlorothiazide (HYDRODIURIL) 25 MG tablet, Take 25 mg by mouth daily., Disp: , Rfl:  .  insulin regular human CONCENTRATED (HUMULIN R) 500 UNIT/ML injection, Inject 110-140 Units into the skin See admin instructions. 110 units at breakfast, 140 units at lunch, 110 units at dinner, Disp: , Rfl:  .  levothyroxine  (SYNTHROID, LEVOTHROID) 150 MCG tablet, Take 150 mcg by mouth daily before breakfast., Disp: , Rfl:  .  lisinopril (ZESTRIL) 40 MG tablet, Take 40 mg by mouth daily., Disp: , Rfl:  .  metFORMIN (GLUCOPHAGE) 500 MG tablet, Take 500 mg by mouth 2 (two) times daily with a meal., Disp: , Rfl:  .  Multiple Vitamin (MULTIVITAMIN WITH MINERALS) TABS tablet, Take 1 tablet by mouth daily., Disp: , Rfl:  .  rOPINIRole (REQUIP) 0.25 MG tablet, Take 0.25 mg by mouth at bedtime., Disp: , Rfl:  .  rosuvastatin (CRESTOR) 40 MG tablet, Take 20 mg by mouth daily. , Disp: , Rfl:  .  sertraline (ZOLOFT) 25 MG tablet, Take 1 tablet (25 mg total) by mouth daily. (Patient not taking: Reported on 08/11/2020), Disp: 30 tablet, Rfl: 0 .  tamsulosin (FLOMAX) 0.4 MG CAPS capsule, Take 0.4 mg by mouth., Disp: , Rfl:  .  tiotropium (SPIRIVA) 18 MCG inhalation capsule, Place 1 capsule (18 mcg total) into inhaler and inhale daily., Disp: 30 capsule, Rfl: 5   Ht Readings from Last 1 Encounters:  07/16/20 6' (1.829 m)     Wt Readings from Last 3 Encounters:  08/18/20 231 lb 0.7 oz (104.8 kg)  08/11/20 230 lb 2.6 oz (104.4 kg)  07/16/20 226 lb 12.8 oz (102.9 kg)     There is no height or weight on file to calculate BMI.   Social  History   Tobacco Use  Smoking Status Former Smoker  . Packs/day: 1.00  . Years: 25.00  . Pack years: 25.00  . Types: Cigarettes  . Quit date: 06/05/2018  . Years since quitting: 2.2  Smokeless Tobacco Never Used      Nutrition Note  Spoke with pt. Nutrition Plan and Nutrition Survey goals reviewed with pt.   Pt has Type 2 Diabetes. Pt checks CBG's 4 times a day. Fasting CBG's reportedly 240-270 mg/dL.  Pt taking Humulin R 500 and eating very few carbohydrates. Typical intake: B - 3 eggs  L - 1 slice white wheat bread, 2 oz deli [place], mayo D - [place] or tuna patty with canned green beans  Seemingly experiencing dawn phenomenon.  He avoids eating if his CBGs are too high. Some  days he only eats dinner. He says he stays hungry all the time.   Appetite: good Unintentional weight loss: no Difficulty eating: no Meals per day: 1-3 depending on CBGs   Pt expressed understanding of the information reviewed.   Nutrition Diagnosis ? Inconsistent carbohydrate intake related to hyper and hypoglycemia as evidenced by A1C 8.2 and fasting CBG 240-270 mg/dl   Nutrition Intervention ? Pt's individual nutrition plan reviewed with pt. ? Benefits of adopting healthy diet reviewed with Rate My Plate survey   ? Continue client-centered nutrition education by RD, as part of interdisciplinary care.  Goal(s) ? CBG concentrations in the normal range or as close to normal as is safely possible. ? Improved blood glucose control as evidenced by pt's A1c trending from 8.2 toward less than 7.0.  Plan:   Will provide client-centered nutrition education as part of interdisciplinary care  Monitor and evaluate progress toward nutrition goal with team.   Michaele Offer, MS, RDN, LDN

## 2020-08-20 NOTE — Progress Notes (Signed)
Pulmonary Individual Treatment Plan  Patient Details  Name: Sean Saunders MRN: [id number] Date of Birth: 25-Mar-1946 Referring Provider:     Pulmonary Rehab Walk Test from 08/11/2020 in [place]  Referring Provider Dr. Lamonte Sakai      Initial Encounter Date:    Pulmonary Rehab Walk Test from 08/11/2020 in [place]  Date 08/11/20      Visit Diagnosis: Chronic obstructive pulmonary disease, unspecified COPD type ([place])  Patient's Home Medications on Admission:   Current Outpatient Medications:  .  allopurinol (ZYLOPRIM) 100 MG tablet, Take 100 mg by mouth 2 (two) times daily., Disp: , Rfl:  .  amLODipine (NORVASC) 10 MG tablet, Take 10 mg by mouth daily., Disp: , Rfl:  .  cetirizine (ZYRTEC ALLERGY) 10 MG tablet, Take 1 tablet (10 mg total) by mouth daily., Disp: 30 tablet, Rfl: 5 .  clopidogrel (PLAVIX) 75 MG tablet, Take 75 mg by mouth daily.  , Disp: , Rfl:  .  COMBIVENT RESPIMAT 20-100 MCG/ACT AERS respimat, Inhale 2 puffs into the lungs 3 (three) times daily. (Patient not taking: Reported on 07/16/2020), Disp: , Rfl:  .  fluticasone (FLONASE) 50 MCG/ACT nasal spray, Place 2 sprays into both nostrils daily., Disp: 16 g, Rfl: 5 .  fluticasone-salmeterol (ADVAIR HFA) 115-21 MCG/ACT inhaler, Inhale 2 puffs into the lungs 2 (two) times daily., Disp: 1 Inhaler, Rfl: 0 .  furosemide (LASIX) 20 MG tablet, Take 20 mg by mouth every other day. , Disp: , Rfl:  .  hydrALAZINE (APRESOLINE) 50 MG tablet, Take 50 mg by mouth every morning., Disp: , Rfl:  .  hydrochlorothiazide (HYDRODIURIL) 25 MG tablet, Take 25 mg by mouth daily., Disp: , Rfl:  .  insulin regular human CONCENTRATED (HUMULIN R) 500 UNIT/ML injection, Inject 110-140 Units into the skin See admin instructions. 110 units at breakfast, 140 units at lunch, 110 units at dinner, Disp: , Rfl:  .  levothyroxine (SYNTHROID, LEVOTHROID) 150 MCG tablet, Take 150 mcg by mouth daily  before breakfast., Disp: , Rfl:  .  lisinopril (ZESTRIL) 40 MG tablet, Take 40 mg by mouth daily., Disp: , Rfl:  .  metFORMIN (GLUCOPHAGE) 500 MG tablet, Take 500 mg by mouth 2 (two) times daily with a meal., Disp: , Rfl:  .  Multiple Vitamin (MULTIVITAMIN WITH MINERALS) TABS tablet, Take 1 tablet by mouth daily., Disp: , Rfl:  .  rOPINIRole (REQUIP) 0.25 MG tablet, Take 0.25 mg by mouth at bedtime., Disp: , Rfl:  .  rosuvastatin (CRESTOR) 40 MG tablet, Take 20 mg by mouth daily. , Disp: , Rfl:  .  sertraline (ZOLOFT) 25 MG tablet, Take 1 tablet (25 mg total) by mouth daily. (Patient not taking: Reported on 08/11/2020), Disp: 30 tablet, Rfl: 0 .  tamsulosin (FLOMAX) 0.4 MG CAPS capsule, Take 0.4 mg by mouth., Disp: , Rfl:  .  tiotropium (SPIRIVA) 18 MCG inhalation capsule, Place 1 capsule (18 mcg total) into inhaler and inhale daily., Disp: 30 capsule, Rfl: 5  Past Medical History: Past Medical History:  Diagnosis Date  . Arthritis   . COPD (chronic obstructive pulmonary disease) ([place])   . Diabetes mellitus   . DVT (deep venous thrombosis) ([place])   . GERD (gastroesophageal reflux disease)   . Hyperlipidemia   . Hypertension   . Hypothyroidism   . Onychomycosis 06/07/2013  . Stroke [facility])    TIA's 01/05/11-01/08/11-01/26/11  . TIA (transient ischemic attack) 08/03/2011   Left sided weakness.  3 episodes Feb-March    Tobacco Use: Social History   Tobacco Use  Smoking Status Former Smoker  . Packs/day: 1.00  . Years: 25.00  . Pack years: 25.00  . Types: Cigarettes  . Quit date: 06/05/2018  . Years since quitting: 2.2  Smokeless Tobacco Never Used    Labs: Recent Review Flowsheet Data    Labs for ITP Cardiac and Pulmonary Rehab Latest Ref Rng & Units 06/04/2012 12/31/2019 01/01/2020 06/27/2020 06/29/2020   Cholestrol 0 - 200 mg/dL - - - - -   LDLCALC 0 - 99 mg/dL - - - - -   HDL >39 mg/dL - - - - -   Trlycerides <150 mg/dL - 236(H) 194(H) - -   Hemoglobin A1c 4.8 - 5.6 % - 7.7(H) -  8.2(H) -   PHART 7.35 - 7.45 - - - - 7.437   PCO2ART 32 - 48 mmHg - - - - 37.3   HCO3 20.0 - 28.0 mmol/L - - - - 24.8   TCO2 0 - 100 mmol/L 21 - - - -   O2SAT % - - - - 96.3      Capillary Blood Glucose: Lab Results  Component Value Date   GLUCAP 298 (H) 07/01/2020   GLUCAP 300 (H) 07/01/2020   GLUCAP 292 (H) 07/01/2020   GLUCAP 260 (H) 07/01/2020   GLUCAP 236 (H) 07/01/2020    POCT Glucose    Row Name 08/11/20 1510 08/18/20 1233           POCT Blood Glucose   Pre-Exercise 246 mg/dL 196 mg/dL  Fasting 250      Post-Exercise -- 165 mg/dL             Pulmonary Assessment Scores:  Pulmonary Assessment Scores    Row Name 08/11/20 1157 08/11/20 1543       ADL UCSD   SOB Score total 58 --      CAT Score   CAT Score 21 --      mMRC Score   mMRC Score -- 4          UCSD: Self-administered rating of dyspnea associated with activities of daily living (ADLs) 6-point scale (0 = "not at all" to 5 = "maximal or unable to do because of breathlessness")  Scoring Scores range from 0 to 120.  Minimally important difference is 5 units  CAT: CAT can identify the health impairment of COPD patients and is better correlated with disease progression.  CAT has a scoring range of zero to 40. The CAT score is classified into four groups of low (less than 10), medium (10 - 20), high (21-30) and very high (31-40) based on the impact level of disease on health status. A CAT score over 10 suggests significant symptoms.  A worsening CAT score could be explained by an exacerbation, poor medication adherence, poor inhaler technique, or progression of COPD or comorbid conditions.  CAT MCID is 2 points  mMRC: mMRC (Modified Medical Research Council) Dyspnea Scale is used to assess the degree of baseline functional disability in patients of respiratory disease due to dyspnea. No minimal important difference is established. A decrease in score of 1 point or greater is considered a positive  change.   Pulmonary Function Assessment:  Pulmonary Function Assessment - 08/11/20 1508      Breath   Bilateral Breath Sounds Rhonchi;Decreased   A few scatteried rhonchi and decreased breath sounds throughout   Shortness of Breath Yes;Limiting activity  Exercise Target Goals: Exercise Program Goal: Individual exercise prescription set using results from initial 6 min walk test and THRR while considering  patient's activity barriers and safety.   Exercise Prescription Goal: Initial exercise prescription builds to 30-45 minutes a day of aerobic activity, 2-3 days per week.  Home exercise guidelines will be given to patient during program as part of exercise prescription that the participant will acknowledge.  Activity Barriers & Risk Stratification:  Activity Barriers & Cardiac Risk Stratification - 08/11/20 0920      Activity Barriers & Cardiac Risk Stratification   Activity Barriers Right Hip Replacement;Joint Problems;Deconditioning;Shortness of Breath;Muscular Weakness    Cardiac Risk Stratification High           6 Minute Walk:  6 Minute Walk    Row Name 08/11/20 1543         6 Minute Walk   Distance 681 feet     Walk Time 6 minutes     # of Rest Breaks 1  pt had to stop at minute 4 due to severe shortness of breath and feeling light headed     MPH 1.29     METS 1.59     RPE 12     Perceived Dyspnea  3     VO2 Peak 5.56     Symptoms Yes (comment)     Comments pt started becoming wobbly and unstable around the 4th minute of the walk test. Had him sit down at that time to check vitals and asses how he was feeling. Pt stated he was very short of breath and finally admitted to being somewhat light headed. we monitored his vitals and everything was normal. pt stopped walk test at 4 minutes.     Resting HR 81 bpm     Resting BP 126/62     Resting Oxygen Saturation  97 %     Exercise Oxygen Saturation  during 6 min walk 96 %     Max Ex. HR 97 bpm     Max Ex.  BP 164/54     2 Minute Post BP 132/58       Interval HR   1 Minute HR 91     2 Minute HR 96     3 Minute HR 97     4 Minute HR 96     5 Minute HR 88     6 Minute HR 88     2 Minute Post HR 84     Interval Heart Rate? Yes       Interval Oxygen   Interval Oxygen? Yes     Baseline Oxygen Saturation % 97 %     1 Minute Oxygen Saturation % 96 %     1 Minute Liters of Oxygen 0 L     2 Minute Oxygen Saturation % 97 %     2 Minute Liters of Oxygen 0 L     3 Minute Oxygen Saturation % 97 %     3 Minute Liters of Oxygen 0 L     4 Minute Oxygen Saturation % 98 %     4 Minute Liters of Oxygen 0 L     5 Minute Oxygen Saturation % 98 %     5 Minute Liters of Oxygen 0 L     6 Minute Oxygen Saturation % 98 %     6 Minute Liters of Oxygen 0 L     2 Minute Post Oxygen Saturation % 98 %  2 Minute Post Liters of Oxygen 0 L            Oxygen Initial Assessment:  Oxygen Initial Assessment - 08/11/20 0921      Home Oxygen   Home Oxygen Device None    Sleep Oxygen Prescription None    Home Exercise Oxygen Prescription None    Home at Rest Exercise Oxygen Prescription None    Compliance with Home Oxygen Use Yes           Oxygen Re-Evaluation:   Oxygen Discharge (Final Oxygen Re-Evaluation):   Initial Exercise Prescription:  Initial Exercise Prescription - 08/11/20 1500      Date of Initial Exercise RX and Referring Provider   Date 08/11/20    Referring Provider Dr. Lamonte Sakai    Expected Discharge Date 10/08/20      NuStep   Level 2    SPM 80    Minutes 30    METs 1.6      Prescription Details   Frequency (times per week) 2    Duration Progress to 30 minutes of continuous aerobic without signs/symptoms of physical distress      Intensity   THRR 40-80% of Max Heartrate 58-116    Ratings of Perceived Exertion 11-13    Perceived Dyspnea 0-4      Progression   Progression Continue to progress workloads to maintain intensity without signs/symptoms of physical distress.       Resistance Training   Training Prescription Yes    Weight blue bands    Reps 10-15           Perform Capillary Blood Glucose checks as needed.  Exercise Prescription Changes:  Exercise Prescription Changes    Row Name 08/18/20 1200             Response to Exercise   Blood Pressure (Admit) 158/62       Blood Pressure (Exercise) 134/70       Blood Pressure (Exit) 130/60       Heart Rate (Admit) 86 bpm       Heart Rate (Exercise) 86 bpm       Heart Rate (Exit) 83 bpm       Oxygen Saturation (Admit) 98 %       Oxygen Saturation (Exercise) 98 %       Oxygen Saturation (Exit) 99 %       Rating of Perceived Exertion (Exercise) 11       Perceived Dyspnea (Exercise) 1       Duration Progress to 30 minutes of  aerobic without signs/symptoms of physical distress       Intensity Other (comment)  40-80% of HRR         Progression   Progression Continue to progress workloads to maintain intensity without signs/symptoms of physical distress.         Resistance Training   Training Prescription No       Weight blue bands       Reps 10-15       Time 10 Minutes         NuStep   Level 1       SPM 80       Minutes 30       METs 1.8              Exercise Comments:   Exercise Goals and Review:  Exercise Goals    Row Name 08/11/20 1550  Exercise Goals   Increase Physical Activity Yes       Intervention Provide advice, education, support and counseling about physical activity/exercise needs.;Develop an individualized exercise prescription for aerobic and resistive training based on initial evaluation findings, risk stratification, comorbidities and participant's personal goals.       Expected Outcomes Short Term: Attend rehab on a regular basis to increase amount of physical activity.;Long Term: Add in home exercise to make exercise part of routine and to increase amount of physical activity.;Long Term: Exercising regularly at least 3-5 days a week.        Increase Strength and Stamina Yes       Intervention Provide advice, education, support and counseling about physical activity/exercise needs.;Develop an individualized exercise prescription for aerobic and resistive training based on initial evaluation findings, risk stratification, comorbidities and participant's personal goals.       Expected Outcomes Short Term: Increase workloads from initial exercise prescription for resistance, speed, and METs.;Short Term: Perform resistance training exercises routinely during rehab and add in resistance training at home;Long Term: Improve cardiorespiratory fitness, muscular endurance and strength as measured by increased METs and functional capacity (6MWT)       Able to understand and use rate of perceived exertion (RPE) scale Yes       Intervention Provide education and explanation on how to use RPE scale       Expected Outcomes Short Term: Able to use RPE daily in rehab to express subjective intensity level;Long Term:  Able to use RPE to guide intensity level when exercising independently       Able to understand and use Dyspnea scale Yes       Intervention Provide education and explanation on how to use Dyspnea scale       Expected Outcomes Short Term: Able to use Dyspnea scale daily in rehab to express subjective sense of shortness of breath during exertion;Long Term: Able to use Dyspnea scale to guide intensity level when exercising independently       Knowledge and understanding of Target Heart Rate Range (THRR) Yes       Intervention Provide education and explanation of THRR including how the numbers were predicted and where they are located for reference       Expected Outcomes Short Term: Able to state/look up THRR;Long Term: Able to use THRR to govern intensity when exercising independently;Short Term: Able to use daily as guideline for intensity in rehab       Understanding of Exercise Prescription Yes       Intervention Provide education,  explanation, and written materials on patient's individual exercise prescription       Expected Outcomes Short Term: Able to explain program exercise prescription;Long Term: Able to explain home exercise prescription to exercise independently              Exercise Goals Re-Evaluation :   Discharge Exercise Prescription (Final Exercise Prescription Changes):  Exercise Prescription Changes - 08/18/20 1200      Response to Exercise   Blood Pressure (Admit) 158/62    Blood Pressure (Exercise) 134/70    Blood Pressure (Exit) 130/60    Heart Rate (Admit) 86 bpm    Heart Rate (Exercise) 86 bpm    Heart Rate (Exit) 83 bpm    Oxygen Saturation (Admit) 98 %    Oxygen Saturation (Exercise) 98 %    Oxygen Saturation (Exit) 99 %    Rating of Perceived Exertion (Exercise) 11    Perceived Dyspnea (Exercise) 1  Duration Progress to 30 minutes of  aerobic without signs/symptoms of physical distress    Intensity Other (comment)   40-80% of HRR     Progression   Progression Continue to progress workloads to maintain intensity without signs/symptoms of physical distress.      Resistance Training   Training Prescription No    Weight blue bands    Reps 10-15    Time 10 Minutes      NuStep   Level 1    SPM 80    Minutes 30    METs 1.8           Nutrition:  Target Goals: Understanding of nutrition guidelines, daily intake of sodium <1541m, cholesterol <2027m calories 30% from fat and 7% or less from saturated fats, daily to have 5 or more servings of fruits and vegetables.  Biometrics:  Pre Biometrics - 08/11/20 1510      Pre Biometrics   Grip Strength 41 kg            Nutrition Therapy Plan and Nutrition Goals:   Nutrition Assessments:   Nutrition Goals Re-Evaluation:   Nutrition Goals Discharge (Final Nutrition Goals Re-Evaluation):   Psychosocial: Target Goals: Acknowledge presence or absence of significant depression and/or stress, maximize coping skills,  provide positive support system. Participant is able to verbalize types and ability to use techniques and skills needed for reducing stress and depression.  Initial Review & Psychosocial Screening:  Initial Psych Review & Screening - 08/11/20 0923      Initial Review   Current issues with Current Depression      Family Dynamics   Good Support System? Yes    Concerns Recent loss of significant other      Barriers   Psychosocial barriers to participate in program The patient should benefit from training in stress management and relaxation.      Screening Interventions   Interventions Encouraged to exercise    Expected Outcomes Long Term Goal: Stressors or current issues are controlled or eliminated.;Short Term goal: Identification and review with participant of any Quality of Life or Depression concerns found by scoring the questionnaire.;Long Term goal: The participant improves quality of Life and PHQ9 Scores as seen by post scores and/or verbalization of changes           Quality of Life Scores:  Scores of 19 and below usually indicate a poorer quality of life in these areas.  A difference of  2-3 points is a clinically meaningful difference.  A difference of 2-3 points in the total score of the Quality of Life Index has been associated with significant improvement in overall quality of life, self-image, physical symptoms, and general health in studies assessing change in quality of life.  PHQ-9: Recent Review Flowsheet Data    [facility] 08/11/2020 09/17/2018   Decreased Interest 3  0   Down, Depressed, Hopeless 2 0   PHQ - 2 Score 5 0   Altered sleeping 0 -   Tired, decreased energy 3 -   Change in appetite 3 -   Feeling bad or failure about yourself  0 -   Trouble concentrating 0 -   Moving slowly or fidgety/restless 0 -   Suicidal thoughts 0 -   PHQ-9 Score 11 -   Difficult doing work/chores Not difficult at all -     Interpretation of Total Score  Total  Score Depression Severity:  1-4 = Minimal depression, 5-9 = Mild depression, 10-14 = Moderate  depression, 15-19 = Moderately severe depression, 20-27 = Severe depression   Psychosocial Evaluation and Intervention:  Psychosocial Evaluation - 08/19/20 1506      Psychosocial Evaluation & Interventions   Interventions Stress management education;Relaxation education;Encouraged to exercise with the program and follow exercise prescription    Comments Pt with recent loss of close family member.  Pt has prescription for Zoloft but does not wish to take it at this time.    Expected Outcomes Pt will employ pt  preferred non medication tools to assist in managment of his depression such as increased physical activity    Continue Psychosocial Services  Follow up required by staff           Psychosocial Re-Evaluation:  Psychosocial Re-Evaluation    [place] Name 08/19/20 1510 08/19/20 1511 08/19/20 1633         Psychosocial Re-Evaluation   Current issues with Current Depression Current Depression --     Comments Pt has completed 1 exercise session. Will continue to engage with pt to establish rapport. Pt has completed 1 exercise session. Will continue to engage with pt to establish rapport. Ishaaq  has completed 1 exercise session. Will continue to engage with pt to establish rapport.     Expected Outcomes Pt will demonstrate positve and healthy coping skills to manage his current depression Pt will demonstrate positve and healthy coping skills to manage his current depression with emphasis on increasing physical activity Benson will demonstrate positve and healthy coping skills to manage his current depression with emphasis on increasing physical activity     Interventions Stress management education;Relaxation education;Encouraged to attend Pulmonary Rehabilitation for the exercise Stress management education;Relaxation education;Encouraged to attend Pulmonary Rehabilitation for the exercise --      Continue Psychosocial Services  -- Follow up required by staff --            Psychosocial Discharge (Final Psychosocial Re-Evaluation):  Psychosocial Re-Evaluation - 08/19/20 1633      Psychosocial Re-Evaluation   Comments Coral  has completed 1 exercise session. Will continue to engage with pt to establish rapport.    Expected Outcomes Jarry will demonstrate positve and healthy coping skills to manage his current depression with emphasis on increasing physical activity           Education: Education Goals: Education classes will be provided on a weekly basis, covering required topics. Participant will state understanding/return demonstration of topics presented.  Learning Barriers/Preferences:  Learning Barriers/Preferences - 08/11/20 0925      Learning Barriers/Preferences   Learning Barriers Sight;Hearing    Learning Preferences Written Material;Computer/Internet           Education Topics: Risk Factor Reduction:  -Group instruction that is supported by a PowerPoint presentation. Instructor discusses the definition of a risk factor, different risk factors for pulmonary disease, and how the heart and lungs work together.     Nutrition for Pulmonary Patient:  -Group instruction provided by PowerPoint slides, verbal discussion, and written materials to support subject matter. The instructor gives an explanation and review of healthy diet recommendations, which includes a discussion on weight management, recommendations for fruit and vegetable consumption, as well as protein, fluid, caffeine, fiber, sodium, sugar, and alcohol. Tips for eating when patients are short of breath are discussed.   Pursed Lip Breathing:  -Group instruction that is supported by demonstration and informational handouts. Instructor discusses the benefits of pursed lip and diaphragmatic breathing and detailed demonstration on how to preform both.     Oxygen  Safety:  -Group instruction provided  by PowerPoint, verbal discussion, and written material to support subject matter. There is an overview of "What is Oxygen" and "Why do we need it".  Instructor also reviews how to create a safe environment for oxygen use, the importance of using oxygen as prescribed, and the risks of noncompliance. There is a brief discussion on traveling with oxygen and resources the patient may utilize.   Oxygen Equipment:  -Group instruction provided by [facility] Staff utilizing handouts, written materials, and equipment demonstrations.   Signs and Symptoms:  -Group instruction provided by written material and verbal discussion to support subject matter. Warning signs and symptoms of infection, stroke, and heart attack are reviewed and when to call the physician/911 reinforced. Tips for preventing the spread of infection discussed.   Advanced Directives:  -Group instruction provided by verbal instruction and written material to support subject matter. Instructor reviews Advanced Directive laws and proper instruction for filling out document.   Pulmonary Video:  -Group video education that reviews the importance of medication and oxygen compliance, exercise, good nutrition, pulmonary hygiene, and pursed lip and diaphragmatic breathing for the pulmonary patient.   Exercise for the Pulmonary Patient:  -Group instruction that is supported by a PowerPoint presentation. Instructor discusses benefits of exercise, core components of exercise, frequency, duration, and intensity of an exercise routine, importance of utilizing pulse oximetry during exercise, safety while exercising, and options of places to exercise outside of rehab.     Pulmonary Medications:  -Verbally interactive group education provided by instructor with focus on inhaled medications and proper administration.   Anatomy and Physiology of the Respiratory System and Intimacy:  -Group instruction provided by PowerPoint, verbal discussion, and  written material to support subject matter. Instructor reviews respiratory cycle and anatomical components of the respiratory system and their functions. Instructor also reviews differences in obstructive and restrictive respiratory diseases with examples of each. Intimacy, Sex, and Sexuality differences are reviewed with a discussion on how relationships can change when diagnosed with pulmonary disease. Common sexual concerns are reviewed.   MD DAY -A group question and answer session with a medical doctor that allows participants to ask questions that relate to their pulmonary disease state.   OTHER EDUCATION -Group or individual verbal, written, or video instructions that support the educational goals of the pulmonary rehab program.   PULMONARY REHAB OTHER RESPIRATORY from 08/18/2020 in [place]  Date 08/18/20  Educator Handout  [METS]      Holiday Eating Survival Tips:  -Group instruction provided by PowerPoint slides, verbal discussion, and written materials to support subject matter. The instructor gives patients tips, tricks, and techniques to help them not only survive but enjoy the holidays despite the onslaught of food that accompanies the holidays.   Knowledge Questionnaire Score:  Knowledge Questionnaire Score - 08/11/20 1156      Knowledge Questionnaire Score   Pre Score 13/18           Core Components/Risk Factors/Patient Goals at Admission:  Personal Goals and Risk Factors at Admission - 08/11/20 0926      Core Components/Risk Factors/Patient Goals on Admission    Weight Management Weight Loss    Goal Weight: Long Term 200 lb (90.7 kg)    Improve shortness of breath with ADL's Yes    Intervention Provide education, individualized exercise plan and daily activity instruction to help decrease symptoms of SOB with activities of daily living.    Expected Outcomes Short Term: Improve  cardiorespiratory fitness to achieve a reduction of  symptoms when performing ADLs;Long Term: Be able to perform more ADLs without symptoms or delay the onset of symptoms    Diabetes Yes    Intervention Provide education about signs/symptoms and action to take for hypo/hyperglycemia.;Provide education about proper nutrition, including hydration, and aerobic/resistive exercise prescription along with prescribed medications to achieve blood glucose in normal ranges: Fasting glucose 65-99 mg/dL    Expected Outcomes Short Term: Participant verbalizes understanding of the signs/symptoms and immediate care of hyper/hypoglycemia, proper foot care and importance of medication, aerobic/resistive exercise and nutrition plan for blood glucose control.;Long Term: Attainment of HbA1C < 7%.    Stress Yes    Intervention Refer participants experiencing significant psychosocial distress to appropriate mental health specialists for further evaluation and treatment. When possible, include family members and significant others in education/counseling sessions.;Offer individual and/or small group education and counseling on adjustment to heart disease, stress management and health-related lifestyle change. Teach and support self-help strategies.    Expected Outcomes Short Term: Participant demonstrates changes in health-related behavior, relaxation and other stress management skills, ability to obtain effective social support, and compliance with psychotropic medications if prescribed.;Long Term: Emotional wellbeing is indicated by absence of clinically significant psychosocial distress or social isolation.           Core Components/Risk Factors/Patient Goals Review:   Goals and Risk Factor Review    Row Name 08/19/20 1513 08/19/20 1634           Core Components/Risk Factors/Patient Goals Review   Personal Goals Review Weight Management/Obesity;Improve shortness of breath with ADL's;Develop more efficient breathing techniques such as purse lipped breathing and  diaphragmatic breathing and practicing self-pacing with activity.;Increase knowledge of respiratory medications and ability to use respiratory devices properly.;Stress;Diabetes --      Review Pt has completed 1 exercise session.  Will continue to monitor pt ability and technique wit PLB. Barrier to this is chronic sinusitis and congestion make breathing through his nose difficult.  Will need to concentrate on moving pt toward independent diaphragmatic breathing. Will have Ailene Ravel RD to establish plan for pt desired weight loss and diabetes management.  Pt takes both oral agents and high dose insulin with labile CBG readings  as a result of not eating breakfast consistently. Jaymien has completed 1 exercise session.  Will continue to monitor pt ability and technique wit PLB. Barrier to this is chronic sinusitis and congestion make breathing through his nose difficult.  Will need to concentrate on moving pt toward independent diaphragmatic breathing. Will have Ailene Ravel RD to establish plan for pt desired weight loss and diabetes management.  Pt takes both oral agents and high dose insulin with labile CBG readings  as a result of not eating breakfast consistently.      Expected Outcomes See Admission Goals --             Core Components/Risk Factors/Patient Goals at Discharge (Final Review):   Goals and Risk Factor Review - 08/19/20 1634      Core Components/Risk Factors/Patient Goals Review   Review Amedeo has completed 1 exercise session.  Will continue to monitor pt ability and technique wit PLB. Barrier to this is chronic sinusitis and congestion make breathing through his nose difficult.  Will need to concentrate on moving pt toward independent diaphragmatic breathing. Will have Ailene Ravel RD to establish plan for pt desired weight loss and diabetes management.  Pt takes both oral agents and high dose insulin with labile CBG readings  as a result of not eating breakfast consistently.            ITP Comments:   Comments:  Nikko has completed 1 exercise session in Pulmonary rehab. Pulmonary rehab staff will  continue to monitor and reassess progress toward goals during her participation in Pulmonary Rehab. Cherre Huger, BSN Cardiac and Training and development officer

## 2020-08-20 NOTE — Progress Notes (Signed)
Daily Session Note  Patient Details  Name: Sean Saunders MRN: [id number] Date of Birth: [birth year] Referring Provider:     Pulmonary Rehab Walk Test from 08/11/2020 in [place]  Referring Provider Dr. Lamonte Sakai      Encounter Date: 08/20/2020  Check In:  Session Check In - 08/20/20 1154      Check-In   Supervising physician immediately available to respond to emergencies [facility] immediately available    Physician(s) Dr. Cathlean Sauer    Location [facility]    Staff Present Rodney Langton, RN;Jessica Hassell Done, MS, ACSM-CEP, Exercise Physiologist;Portia Rollene Rotunda, RN, BSN    Virtual Visit No    Medication changes reported     No    Fall or balance concerns reported    No    Tobacco Cessation No Change    Warm-up and Cool-down Performed on first and last piece of equipment    Resistance Training Performed Yes    VAD Patient? No    PAD/SET Patient? No      Pain Assessment   Currently in Pain? No/denies    Multiple Pain Sites No           Capillary Blood Glucose: Results for orders placed or performed during the hospital encounter of 08/20/20 (from the past 24 hour(s))  Glucose, capillary     Status: Abnormal   Collection Time: 08/20/20 11:16 AM  Result Value Ref Range   Glucose-Capillary 137 (H) 70 - 99 mg/dL  Glucose, capillary     Status: None   Collection Time: 08/20/20 12:03 PM  Result Value Ref Range   Glucose-Capillary 86 70 - 99 mg/dL      Social History   Tobacco Use  Smoking Status Former Smoker  . Packs/day: 1.00  . Years: 25.00  . Pack years: 25.00  . Types: Cigarettes  . Quit date: 06/05/2018  . Years since quitting: 2.2  Smokeless Tobacco Never Used    Goals Met:  Exercise tolerated well No report of cardiac concerns or symptoms Strength training completed today  Goals Unmet:  Not Applicable  Comments: Service time is from 1040 to 1210    Dr. Fransico Him is Medical Director for Cardiac Rehab  at [facility].

## 2020-08-25 ENCOUNTER — Telehealth: Payer: Self-pay | Admitting: Emergency Medicine

## 2020-08-25 ENCOUNTER — Ambulatory Visit (INDEPENDENT_AMBULATORY_CARE_PROVIDER_SITE_OTHER): Payer: Medicare HMO

## 2020-08-25 ENCOUNTER — Telehealth (HOSPITAL_COMMUNITY): Payer: Self-pay

## 2020-08-25 ENCOUNTER — Encounter (HOSPITAL_COMMUNITY)
Admission: RE | Admit: 2020-08-25 | Discharge: 2020-08-25 | Disposition: A | Discharge status: Home / Self Care | Payer: Medicare HMO | Source: Ambulatory Visit | Attending: Emergency Medicine | Admitting: Emergency Medicine

## 2020-08-25 ENCOUNTER — Other Ambulatory Visit: Payer: Self-pay

## 2020-08-25 DIAGNOSIS — Z0000 Encounter for general adult medical examination without abnormal findings: Secondary | ICD-10-CM | POA: Diagnosis not present

## 2020-08-25 DIAGNOSIS — J449 Chronic obstructive pulmonary disease, unspecified: Secondary | ICD-10-CM | POA: Diagnosis not present

## 2020-08-25 NOTE — Telephone Encounter (Signed)
Sean Saunders called to report to [place] that upon leaving pulmonary rehab today, he felt "woozy" and when he checked his oxygen saturation at home, it was 75%. His sats were between 96% and 98% while exercising today at pulmonary rehab and tolerated exercise well. He states that in the couple of hours he has been at home since pulmonary rehab his sats have not increased to >88%. He reported that they were 85-87% while we were on the phone. I advised him to call Dr. Agustina Caroli office immediately for advice. He voiced understanding.

## 2020-08-25 NOTE — Patient Instructions (Signed)
Sean Saunders , Thank you for taking time to come for your Medicare Wellness Visit. I appreciate your ongoing commitment to your health goals. Please review the following plan we discussed and let me know if I can assist you in the future.   Screening recommendations/referrals: Colonoscopy: Done 10/30/17 Recommended yearly ophthalmology/optometry visit for glaucoma screening and checkup Recommended yearly dental visit for hygiene and checkup  Vaccinations: Influenza vaccine: Up to date Pneumococcal vaccine: Due and Discussed Tdap vaccine: Up to date Shingles vaccine: Shingrix discussed. Please contact your pharmacy for coverage information.    Covid-19: Completed  Advanced directives: Copy in chart  Conditions/risks identified: Control breathing and diet  Next appointment: Follow up in one year for your annual wellness visit.   Preventive Care 6 Years and Older, Male Preventive care refers to lifestyle choices and visits with your health care provider that can promote health and wellness. What does preventive care include?  A yearly physical exam. This is also called an annual well check.  Dental exams once or twice a year.  Routine eye exams. Ask your health care provider how often you should have your eyes checked.  Personal lifestyle choices, including:  Daily care of your teeth and gums.  Regular physical activity.  Eating a healthy diet.  Avoiding tobacco and drug use.  Limiting alcohol use.  Practicing safe sex.  Taking low doses of aspirin every day.  Taking vitamin and mineral supplements as recommended by your health care provider. What happens during an annual well check? The services and screenings done by your health care provider during your annual well check will depend on your age, overall health, lifestyle risk factors, and family history of disease. Counseling  Your health care provider may ask you questions about your:  Alcohol use.  Tobacco  use.  Drug use.  Emotional well-being.  Home and relationship well-being.  Sexual activity.  Eating habits.  History of falls.  Memory and ability to understand (cognition).  Work and work Statistician. Screening  You may have the following tests or measurements:  Height, weight, and BMI.  Blood pressure.  Lipid and cholesterol levels. These may be checked every 5 years, or more frequently if you are over 51 years old.  Skin check.  Lung cancer screening. You may have this screening every year starting at age 20 if you have a 30-pack-year history of smoking and currently smoke or have quit within the past 15 years.  Fecal occult blood test (FOBT) of the stool. You may have this test every year starting at age 19.  Flexible sigmoidoscopy or colonoscopy. You may have a sigmoidoscopy every 5 years or a colonoscopy every 10 years starting at age 32.  Prostate cancer screening. Recommendations will vary depending on your family history and other risks.  Hepatitis C blood test.  Hepatitis B blood test.  Sexually transmitted disease (STD) testing.  Diabetes screening. This is done by checking your blood sugar (glucose) after you have not eaten for a while (fasting). You may have this done every 1-3 years.  Abdominal aortic aneurysm (AAA) screening. You may need this if you are a current or former smoker.  Osteoporosis. You may be screened starting at age 11 if you are at high risk. Talk with your health care provider about your test results, treatment options, and if necessary, the need for more tests. Vaccines  Your health care provider may recommend certain vaccines, such as:  Influenza vaccine. This is recommended every year.  Tetanus, diphtheria,  and acellular pertussis (Tdap, Td) vaccine. You may need a Td booster every 10 years.  Zoster vaccine. You may need this after age 56.  Pneumococcal 13-valent conjugate (PCV13) vaccine. One dose is recommended after age  61.  Pneumococcal polysaccharide (PPSV23) vaccine. One dose is recommended after age 75. Talk to your health care provider about which screenings and vaccines you need and how often you need them. This information is not intended to replace advice given to you by your health care provider. Make sure you discuss any questions you have with your health care provider. Document Released: 12/25/2015 Document Revised: 08/17/2016 Document Reviewed: 09/29/2015 Elsevier Interactive Patient Education  2017 [place] Prevention in the Home Falls can cause injuries. They can happen to people of all ages. There are many things you can do to make your home safe and to help prevent falls. What can I do on the outside of my home?  Regularly fix the edges of walkways and driveways and fix any cracks.  Remove anything that might make you trip as you walk through a door, such as a raised step or threshold.  Trim any bushes or trees on the path to your home.  Use bright outdoor lighting.  Clear any walking paths of anything that might make someone trip, such as rocks or tools.  Regularly check to see if handrails are loose or broken. Make sure that both sides of any steps have handrails.  Any raised decks and porches should have guardrails on the edges.  Have any leaves, snow, or ice cleared regularly.  Use sand or salt on walking paths during winter.  Clean up any spills in your garage right away. This includes oil or grease spills. What can I do in the bathroom?  Use night lights.  Install grab bars by the toilet and in the tub and shower. Do not use towel bars as grab bars.  Use non-skid mats or decals in the tub or shower.  If you need to sit down in the shower, use a plastic, non-slip stool.  Keep the floor dry. Clean up any water that spills on the floor as soon as it happens.  Remove soap buildup in the tub or shower regularly.  Attach bath mats securely with double-sided  non-slip rug tape.  Do not have throw rugs and other things on the floor that can make you trip. What can I do in the bedroom?  Use night lights.  Make sure that you have a light by your bed that is easy to reach.  Do not use any sheets or blankets that are too big for your bed. They should not hang down onto the floor.  Have a firm chair that has side arms. You can use this for support while you get dressed.  Do not have throw rugs and other things on the floor that can make you trip. What can I do in the kitchen?  Clean up any spills right away.  Avoid walking on wet floors.  Keep items that you use a lot in easy-to-reach places.  If you need to reach something above you, use a strong step stool that has a grab bar.  Keep electrical cords out of the way.  Do not use floor polish or wax that makes floors slippery. If you must use wax, use non-skid floor wax.  Do not have throw rugs and other things on the floor that can make you trip. What can I do with my  stairs?  Do not leave any items on the stairs.  Make sure that there are handrails on both sides of the stairs and use them. Fix handrails that are broken or loose. Make sure that handrails are as long as the stairways.  Check any carpeting to make sure that it is firmly attached to the stairs. Fix any carpet that is loose or worn.  Avoid having throw rugs at the top or bottom of the stairs. If you do have throw rugs, attach them to the floor with carpet tape.  Make sure that you have a light switch at the top of the stairs and the bottom of the stairs. If you do not have them, ask someone to add them for you. What else can I do to help prevent falls?  Wear shoes that:  Do not have high heels.  Have rubber bottoms.  Are comfortable and fit you well.  Are closed at the toe. Do not wear sandals.  If you use a stepladder:  Make sure that it is fully opened. Do not climb a closed stepladder.  Make sure that both  sides of the stepladder are locked into place.  Ask someone to hold it for you, if possible.  Clearly mark and make sure that you can see:  Any grab bars or handrails.  First and last steps.  Where the edge of each step is.  Use tools that help you move around (mobility aids) if they are needed. These include:  Canes.  Walkers.  Scooters.  Crutches.  Turn on the lights when you go into a dark area. Replace any light bulbs as soon as they burn out.  Set up your furniture so you have a clear path. Avoid moving your furniture around.  If any of your floors are uneven, fix them.  If there are any pets around you, be aware of where they are.  Review your medicines with your doctor. Some medicines can make you feel dizzy. This can increase your chance of falling. Ask your doctor what other things that you can do to help prevent falls. This information is not intended to replace advice given to you by your health care provider. Make sure you discuss any questions you have with your health care provider. Document Released: 09/24/2009 Document Revised: 05/05/2016 Document Reviewed: 01/02/2015 Elsevier Interactive Patient Education  2017 [employer].

## 2020-08-25 NOTE — Progress Notes (Signed)
Virtual Visit via Telephone Note  I connected with  Sean Saunders on 08/25/20 at  2:30 PM EDT by telephone and verified that I am speaking with the correct person using two identifiers.  Medicare Annual Wellness visit completed telephonically due to Covid-19 pandemic.   Persons participating in this call: This Health Coach and this patient.   Location: Patient: HOme Provider: Office   I discussed the limitations, risks, security and privacy concerns of performing an evaluation and management service by telephone and the availability of in person appointments. The patient expressed understanding and agreed to proceed.  Unable to perform video visit due to video visit attempted and failed and/or patient does not have video capability.   Some vital signs may be absent or patient reported.   Willette Brace, LPN    Subjective:   Sean Saunders is a 74 y.o. male who presents for Medicare Annual/Subsequent preventive examination.  Review of Systems     Cardiac Risk Factors include: male gender;hypertension;obesity (BMI >30kg/m2);sedentary lifestyle;diabetes mellitus     Objective:    There were no vitals filed for this visit. There is no height or weight on file to calculate BMI.  Advanced Directives 08/25/2020 06/27/2020 06/26/2020 01/01/2020 01/01/2020 01/01/2020 12/31/2019  Does Patient Have a Medical Advance Directive? Yes Yes No - No Yes Yes  Type of Advance Directive - [place];Living will - - - [place];Living will [place];Living will  Does patient want to make changes to medical advance directive? - No - Patient declined - - No - Patient declined No - Patient declined -  Copy of [place] in Chart? - No - copy requested - - - No - copy requested No - copy requested  Would patient like information on creating a medical advance directive? - - - No - Patient declined - - -  Pre-existing out of  facility DNR order (yellow form or pink MOST form) - - - - - - -    Current Medications (verified) Outpatient Encounter Medications as of 08/25/2020  Medication Sig  . allopurinol (ZYLOPRIM) 100 MG tablet Take 100 mg by mouth 2 (two) times daily.  Marland Kitchen amLODipine (NORVASC) 10 MG tablet Take 10 mg by mouth daily.  . clopidogrel (PLAVIX) 75 MG tablet Take 75 mg by mouth daily.    . fluticasone (FLONASE) 50 MCG/ACT nasal spray Place 2 sprays into both nostrils daily.  . fluticasone-salmeterol (ADVAIR HFA) 115-21 MCG/ACT inhaler Inhale 2 puffs into the lungs 2 (two) times daily.  . furosemide (LASIX) 20 MG tablet Take 20 mg by mouth every other day.   . hydrALAZINE (APRESOLINE) 50 MG tablet Take 50 mg by mouth every morning.  . hydrochlorothiazide (HYDRODIURIL) 25 MG tablet Take 25 mg by mouth daily.  . insulin regular human CONCENTRATED (HUMULIN R) 500 UNIT/ML injection Inject 110-140 Units into the skin See admin instructions. 110 units at breakfast, 140 units at lunch, 110 units at dinner  . levothyroxine (SYNTHROID, LEVOTHROID) 150 MCG tablet Take 150 mcg by mouth daily before breakfast.  . lisinopril (ZESTRIL) 40 MG tablet Take 40 mg by mouth daily.  . metFORMIN (GLUCOPHAGE) 500 MG tablet Take 500 mg by mouth 2 (two) times daily with a meal.  . Multiple Vitamin (MULTIVITAMIN WITH MINERALS) TABS tablet Take 1 tablet by mouth daily.  Marland Kitchen rOPINIRole (REQUIP) 0.25 MG tablet Take 0.25 mg by mouth at bedtime.  . rosuvastatin (CRESTOR) 40 MG tablet Take 20  mg by mouth daily.   . tamsulosin (FLOMAX) 0.4 MG CAPS capsule Take 0.4 mg by mouth.  . tiotropium (SPIRIVA) 18 MCG inhalation capsule Place 1 capsule (18 mcg total) into inhaler and inhale daily.  . cetirizine (ZYRTEC ALLERGY) 10 MG tablet Take 1 tablet (10 mg total) by mouth daily. (Patient not taking: Reported on 08/25/2020)  . COMBIVENT RESPIMAT 20-100 MCG/ACT AERS respimat Inhale 2 puffs into the lungs 3 (three) times daily. (Patient not taking:  Reported on 07/16/2020)  . sertraline (ZOLOFT) 25 MG tablet Take 1 tablet (25 mg total) by mouth daily. (Patient not taking: Reported on 08/11/2020)   No facility-administered encounter medications on file as of 08/25/2020.    Allergies (verified) Eszopiclone and Niaspan [niacin er]   History: Past Medical History:  Diagnosis Date  . Arthritis   . COPD (chronic obstructive pulmonary disease) ([place])   . Diabetes mellitus   . DVT (deep venous thrombosis) ([place])   . GERD (gastroesophageal reflux disease)   . Hyperlipidemia   . Hypertension   . Hypothyroidism   . Onychomycosis 06/07/2013  . Stroke [facility])    TIA's 01/05/11-01/08/11-01/26/11  . TIA (transient ischemic attack) 08/03/2011   Left sided weakness. 3 episodes Feb-March   Past Surgical History:  Procedure Laterality Date  . ABDOMINAL AORTAGRAM N/A 06/04/2012   Procedure: ABDOMINAL Maxcine Ham;  Surgeon: Angelia Mould, MD;  Location: [facility] CATH LAB;  Service: Cardiovascular;  Laterality: N/A;  . APPENDECTOMY    . COLONOSCOPY    . FEMORAL-POPLITEAL BYPASS GRAFT  07/05/2012   Procedure: BYPASS GRAFT FEMORAL-POPLITEAL ARTERY;  Surgeon: Angelia Mould, MD;  Location: [facility];  Service: Vascular;  Laterality: Left;  Revision Left fem/pop w/ artery patch angioplasty utilizing left GSV graft and extenion to tib/per trunk w/right GSV and interop arteriogram  . JOINT REPLACEMENT  2011   -Hip  . NASAL SINUS SURGERY    . PR VEIN BYPASS GRAFT,AORTO-FEM-POP    . PR VEIN BYPASS GRAFT,AORTO-FEM-POP  08-01-11   (L) Fem-pop BP  . toe nail    . TONSILLECTOMY     Family History  Problem Relation Age of Onset  . Stroke Mother   . Diabetes Mother   . Other Father        alzheimers  . Diabetes Father   . Stroke Father   . Diabetes Sister    Social History   Socioeconomic History  . Marital status: Widowed    Spouse name: Not on file  . Number of children: 1  . Years of education: Not on file  . Highest education level: Master's  degree (e.g., MA, MS, MEng, MEd, MSW, MBA)  Occupational History  . Occupation: retired  Tobacco Use  . Smoking status: Former Smoker    Packs/day: 1.00    Years: 25.00    Pack years: 25.00    Types: Cigarettes    Quit date: 06/05/2018    Years since quitting: 2.2  . Smokeless tobacco: Never Used  Vaping Use  . Vaping Use: Never used  Substance and Sexual Activity  . Alcohol use: Not Currently    Alcohol/week: 0.0 standard drinks    Comment: rare  . Drug use: No  . Sexual activity: Not on file  Other Topics Concern  . Not on file  Social History Narrative  . Not on file   Social Determinants of Health   Financial Resource Strain: Low Risk   . Difficulty of Paying Living Expenses: Not hard at all  Food Insecurity:  No Food Insecurity  . Worried About Charity fundraiser in the Last Year: Never true  . Ran Out of Food in the Last Year: Never true  Transportation Needs: No Transportation Needs  . Lack of Transportation (Medical): No  . Lack of Transportation (Non-Medical): No  Physical Activity: Inactive  . Days of Exercise per Week: 0 days  . Minutes of Exercise per Session: 0 min  Stress: No Stress Concern Present  . Feeling of Stress : Only a little  Social Connections: Moderately Isolated  . Frequency of Communication with Friends and Family: More than three times a week  . Frequency of Social Gatherings with Friends and Family: Twice a week  . Attends Religious Services: More than 4 times per year  . Active Member of Clubs or Organizations: No  . Attends Archivist Meetings: Never  . Marital Status: Widowed    Tobacco Counseling Counseling given: Not Answered   Clinical Intake:  Pre-visit preparation completed: Yes  Pain : No/denies pain     BMI - recorded: 31.22 Nutritional Status: BMI > 30  Obese Nutritional Risks: None Diabetes: Yes CBG done?: Yes (246) CBG resulted in Enter/ Edit results?: No Did pt. bring in CBG monitor from home?:  No Glucose Meter Downloaded?: No  How often do you need to have someone help you when you read instructions, pamphlets, or other written materials from your doctor or pharmacy?: 1 - Never  Diabetic?yes  Interpreter Needed?: No  Information entered by :: Charlott Rakes, LPN   Activities of Daily Living In your present state of health, do you have any difficulty performing the following activities: 08/25/2020 06/27/2020  Hearing? Y N  Comment mild loss -  Vision? N N  Difficulty concentrating or making decisions? N N  Walking or climbing stairs? Y N  Dressing or bathing? N N  Doing errands, shopping? N N  Preparing Food and eating ? N -  Using the Toilet? N -  In the past six months, have you accidently leaked urine? N -  Do you have problems with loss of bowel control? N -  Managing your Medications? N -  Managing your Finances? N -  Housekeeping or managing your Housekeeping? N -  Some recent data might be hidden    Patient Care Team: [place], Betty G, MD as PCP - General (Family Medicine) Angelia Mould, MD as Attending Physician (Vascular Surgery) Eli Hose, [facility] (Inactive) as Pharmacist (Pharmacist)  Indicate any recent Medical Services you may have received from other than [facility] providers in the past year (date may be approximate).     Assessment:   This is a routine wellness examination for [place].  Hearing/Vision screen  Hearing Screening   125Hz  250Hz  500Hz  1000Hz  2000Hz  3000Hz  4000Hz  6000Hz  8000Hz   Right ear:           Left ear:           Comments: Pt states mild loss  Vision Screening Comments: Follows up with the [facility] for eye appts  Dietary issues and exercise activities discussed: Current Exercise Habits: Home exercise routine, Type of exercise: walking (when weather permits and at pulmonary appt), Time (Minutes): 20, Frequency (Times/Week): 2, Weekly Exercise (Minutes/Week): 40  Goals    . Chronic Care Management     CARE PLAN  ENTRY  Current Barriers:  . Chronic Disease Management support, education, and care coordination needs related to Hypertension, Hyperlipidemia, and Diabetes   Hypertension . Pharmacist Clinical Goal(s): o  Over the next 180 days, patient will work with PharmD and providers to achieve BP goal <140/90 . Current regimen:  . Amlodipine 10 mg 1 tablet once daily  . Furosemide 20 mg 1 tablet every other day  . Hydralazine 25 mg 1 tablet once daily 3 times a day . Lisinopril 40 mg 1 tablet once daily . Interventions: o Discussed diet and exercise extensively o Discussed the importance of checking blood pressure routinely . Patient self care activities - Over the next 180 days, patient will: o Check BP once a week, document, and provide at future appointments o Ensure daily salt intake < 2300 mg/day o Continue walking half a mile 2-3 times a week  Hyperlipidemia . Pharmacist Clinical Goal(s): o Over the next 180 days, patient will work with PharmD and providers to achieve LDL goal < 100 . Current regimen:  o Rosuvastatin 20 mg 0.5 tablet once daily  . Interventions: o Discussed diet and exercise extensively . Patient self care activities - Over the next 180 days, patient will: o Continue walking half a mile 2-3 times a week o Continue eating more vegetables and control eating meat  Diabetes . Pharmacist Clinical Goal(s): o Over the next 180 days, patient will work with PharmD and providers to achieve A1c goal <7% . Current regimen:  . Humulin R 500 unit/ml 70 units AM, 40 units lunch, 80 units dinner . Metformin 500 mg 1 tablet twice a day with meals . Interventions: o Discussed diet and exercise extensively o Discussed the importance of checking blood sugar routinely . Patient self care activities - Over the next 180 days, patient will: o Check blood sugar 3-4 times daily, document, and provide at future appointments o Contact provider with any episodes of hypoglycemia o Continue  walking 2-3 times a week for half a mile o Continue eating more vegetables and meat and cut down on carbohydrates  Medication management . Pharmacist Clinical Goal(s): o Over the next 180 days, patient will work with PharmD and providers to maintain optimal medication adherence . Current pharmacy: [facility] . Interventions o Comprehensive medication review performed. o Continue current medication management strategy . Patient self care activities - Over the next 180 days, patient will: o Take medications as prescribed o Report any questions or concerns to PharmD and/or provider(s)  Initial goal documentation     . Patient Stated     Control breathing and diet       Depression Screen PHQ 2/9 Scores 08/25/2020 08/11/2020 09/17/2018  PHQ - 2 Score 0 5 0  PHQ- 9 Score - 11 -    Fall Risk Fall Risk  08/25/2020 07/03/2017  Falls in the past year? 0 No  Comment - Emmi Telephone Survey: data to providers prior to load  Number falls in past yr: 0 -  Injury with Fall? 0 -  Risk for fall due to : Impaired balance/gait;Impaired mobility;Impaired vision -  Follow up Falls prevention discussed -    Any stairs in or around the home? No  If so, are there any without handrails? No  Home free of loose throw rugs in walkways, pet beds, electrical cords, etc? Yes  Adequate lighting in your home to reduce risk of falls? Yes   ASSISTIVE DEVICES UTILIZED TO PREVENT FALLS:  Life alert? No  Use of a cane, walker or w/c? No  Grab bars in the bathroom? Yes  Shower chair or bench in shower? Yes  Elevated toilet seat or a handicapped toilet? No  TIMED UP AND GO:  Was the test performed? No   Cognitive Function:     6CIT Screen 08/25/2020  What Year? 0 points  What month? 0 points  Count back from 20 0 points  Months in reverse 0 points  Repeat phrase 2 points    Immunizations Immunization History  Administered Date(s) Administered  . Fluad Quad(high Dose 65+) 08/13/2019  .  Influenza, High Dose Seasonal PF 09/25/2017, 09/17/2018  . Influenza-Unspecified 11/11/2013  . PFIZER SARS-COV-2 Vaccination 02/13/2020, 03/11/2020  . Tdap 12/11/2018    TDAP status: Up to date Flu Vaccine status: Up to date Pneumococcal vaccine status: Declined,  Education has been provided regarding the importance of this vaccine but patient still declined. Advised may receive this vaccine at local pharmacy or Health Dept. Aware to provide a copy of the vaccination record if obtained from local pharmacy or Health Dept. Verbalized acceptance and understanding.  Covid-19 vaccine status: Completed vaccines  Qualifies for Shingles Vaccine? Yes   Zostavax completed No   Shingrix Completed?: No.    Education has been provided regarding the importance of this vaccine. Patient has been advised to call insurance company to determine out of pocket expense if they have not yet received this vaccine. Advised may also receive vaccine at local pharmacy or Health Dept. Verbalized acceptance and understanding.  Screening Tests Health Maintenance  Topic Date Due  . Hepatitis C Screening  Never done  . FOOT EXAM  09/15/2019  . OPHTHALMOLOGY EXAM  09/18/2019  . INFLUENZA VACCINE  07/12/2020  . HEMOGLOBIN A1C  12/28/2020  . COLONOSCOPY  10/30/2022  . COVID-19 Vaccine  Completed  . PNA vac Low Risk Adult  Completed  . TETANUS/TDAP  Discontinued    Health Maintenance  Health Maintenance Due  Topic Date Due  . Hepatitis C Screening  Never done  . FOOT EXAM  09/15/2019  . OPHTHALMOLOGY EXAM  09/18/2019  . INFLUENZA VACCINE  07/12/2020    Colorectal cancer screening: Completed 10/30/17. Repeat every 5 years   Additional Screening:  Hepatitis C Screening: does not qualify  Vision Screening: Recommended annual ophthalmology exams for early detection of glaucoma and other disorders of the eye. Is the patient up to date with their annual eye exam?  Yes  Who is the provider or what is the name of  the office in which the patient attends annual eye exams? Follows up the [facility]: Recommended annual dental exams for proper oral hygiene  Community Resource Referral / Chronic Care Management: CRR required this visit?  No   CCM required this visit?  No      Plan:     I have personally reviewed and noted the following in the patient's chart:   . Medical and social history . Use of alcohol, tobacco or illicit drugs  . Current medications and supplements . Functional ability and status . Nutritional status . Physical activity . Advanced directives . List of other physicians . Hospitalizations, surgeries, and ER visits in previous 12 months . Vitals . Screenings to include cognitive, depression, and falls . Referrals and appointments  In addition, I have reviewed and discussed with patient certain preventive protocols, quality metrics, and best practice recommendations. A written personalized care plan for preventive services as well as general preventive health recommendations were provided to patient.     Willette Brace, LPN   [id number]   Nurse Notes: None

## 2020-08-25 NOTE — Telephone Encounter (Signed)
It sounds like he may need to wear O2 with exertion. Does he already have this? If not then he needs a formal walking oximetry and O2 titration to confirm how much he needs to wear

## 2020-08-25 NOTE — Telephone Encounter (Signed)
Primary Pulmonologist: [facility] Last office visit and with whom: 07/16/2020 Volanda Napoleon What do we see them for (pulmonary problems): COPD Last OV assessment/plan:  Assessment & Plan:   COPD (chronic obstructive pulmonary disease) ([place]) - Recently admitted for COPD exacerbation, treated with 5 day course of abx/steriods. He continues to have some sinusitis symptoms and dyspnea on exertion.  - Continue Advair 115/21 1 puff twice daily and ADDING Spiriva handihaler 60mcg once daily - Refer to pulmonary rehab - Consider palliative care referral at next visit (he is not ready at this time but expressed interest) - FU in 3 months with Dr. Lamonte Sakai  Chronic sinusitis - Adding Zyrtec 10mg  daily and ocean nasal spray twice daily - Continue Flonase nasal spray once daily  Depression - Depressed mood, denies SI thoughts - Continue Zoloft 25mg  daily and follow up with PCP   Martyn Ehrich, NP 07/16/2020     Assessment & Plan Note by Martyn Ehrich, NP at 07/16/2020 9:40 AM Author: Martyn Ehrich, NP Author Type: Nurse Practitioner Filed: 07/16/2020 9:40 AM  Note Status: Written Cosign: Cosign Not Required Encounter Date: 07/16/2020  Problem: Depression  Editor: Martyn Ehrich, NP (Nurse Practitioner)                 - Depressed mood, denies SI thoughts - Continue Zoloft 25mg  daily and follow up with PCP    Assessment & Plan Note by Martyn Ehrich, NP at 07/16/2020 9:39 AM Author: Martyn Ehrich, NP Author Type: Nurse Practitioner Filed: 07/16/2020 9:40 AM  Note Status: Written Cosign: Cosign Not Required Encounter Date: 07/16/2020  Problem: Chronic sinusitis  Editor: Martyn Ehrich, NP (Nurse Practitioner)                 - Adding Zyrtec 10mg  daily and ocean nasal spray twice daily - Continue Flonase nasal spray once daily    Assessment & Plan Note by Martyn Ehrich, NP at 07/16/2020 9:34 AM Author: Martyn Ehrich, NP Author Type: Nurse Practitioner Filed:  07/16/2020 9:39 AM  Note Status: Written Cosign: Cosign Not Required Encounter Date: 07/16/2020  Problem: COPD (chronic obstructive pulmonary disease) [facility])  Editor: Martyn Ehrich, NP (Nurse Practitioner)                 - Recently admitted for COPD exacerbation, treated with 5 day course of abx/steriods. He continues to have some sinusitis symptoms and dyspnea on exertion.  - Continue Advair 115/21 1 puff twice daily and ADDING Spiriva handihaler 35mcg once daily - Refer to pulmonary rehab - Consider palliative care referral at next visit (he is not ready at this time but expressed interest) - FU in 3 months with Dr. Lamonte Sakai    Patient Instructions by Martyn Ehrich, NP at 07/16/2020 9:00 AM Author: Martyn Ehrich, NP Author Type: Nurse Practitioner Filed: 07/16/2020 9:33 AM  Note Status: Addendum Cosign: Cosign Not Required Encounter Date: 07/16/2020  Editor: Martyn Ehrich, NP (Nurse Practitioner)      Prior Versions: 1. Martyn Ehrich, NP (Nurse Practitioner) at 07/16/2020 9:31 AM - Addendum   2. Martyn Ehrich, NP (Nurse Practitioner) at 07/16/2020 9:26 AM - Addendum   3. Martyn Ehrich, NP (Nurse Practitioner) at 07/16/2020 9:20 AM - Addendum   4. Martyn Ehrich, NP (Nurse Practitioner) at 07/16/2020 9:02 AM - Signed      Pleasure seeing you today Ms Virgia Land  Recommendations: - Start Zyrtec 10mg  once daily (sent in RX) -  Start nasal spray twice daily  - Continues Flonase once daily  - Continue Advair 1 puffs twice daily  - Continue Albuterol nebulizer twice a day  Referral: - Pulmonary rehab re: COPD GOLD III  Follow-up: - 3 month fu with Dr. Lamonte Sakai OR sooner       Was appointment offered to patient (explain)?  No, sending to Dr. Lamonte Sakai   Reason for call: Patient went to pulmonary rehab today, states his sats dropped into the 70's after he walked to his car in the heat.  States his sats have gotten back to baseline, 94%.  States he is just a little  sob now.  He is taking his prescribed inhalers.  Denies any fever, chills, body aches, cough.  Dr. Lamonte Sakai, please advise.  (examples of things to ask: : When did symptoms start? Fever? Cough? Productive? Color to sputum? More sputum than usual? Wheezing? Have you needed increased oxygen? Are you taking your respiratory medications? What over the counter measures have you tried?)  Allergies  Allergen Reactions  . Eszopiclone Itching  . Niaspan [Niacin Er] Nausea And Vomiting and Rash    Immunization History  Administered Date(s) Administered  . Fluad Quad(high Dose 65+) 08/13/2019  . Influenza, High Dose Seasonal PF 09/25/2017, 09/17/2018  . Influenza-Unspecified 11/11/2013  . PFIZER SARS-COV-2 Vaccination 02/13/2020, 03/11/2020  . Tdap 12/11/2018

## 2020-08-25 NOTE — Progress Notes (Signed)
Daily Session Note  Patient Details  Name: Sean Saunders MRN: [id number] Date of Birth: [birth year] Referring Provider:     Pulmonary Rehab Walk Test from 08/11/2020 in [place]  Referring Provider Dr. Lamonte Sakai      Encounter Date: 08/25/2020  Check In:  Session Check In - 08/25/20 1104      Check-In   Supervising physician immediately available to respond to emergencies [facility] immediately available    Physician(s) Dr. Cathlean Sauer    Location [facility]    Staff Present Maurice Small, RN, Bjorn Loser, MS, CEP, Exercise Physiologist;Marilyn Nihiser Jani Gravel, MS, ACSM-CEP, Exercise Physiologist    Virtual Visit No    Medication changes reported     No    Fall or balance concerns reported    No    Tobacco Cessation No Change    Warm-up and Cool-down Performed on first and last piece of equipment    Resistance Training Performed Yes    VAD Patient? No    PAD/SET Patient? No      Pain Assessment   Currently in Pain? No/denies    Multiple Pain Sites No           Capillary Blood Glucose: No results found for this or any previous visit (from the past 24 hour(s)).    Social History   Tobacco Use  Smoking Status Former Smoker  . Packs/day: 1.00  . Years: 25.00  . Pack years: 25.00  . Types: Cigarettes  . Quit date: 06/05/2018  . Years since quitting: 2.2  Smokeless Tobacco Never Used    Goals Met:  Exercise tolerated well No report of cardiac concerns or symptoms Strength training completed today  Goals Unmet:  Not Applicable  Comments: Service time is from 1040 to 1140    Dr. Fransico Him is Medical Director for Cardiac Rehab at [facility].

## 2020-08-26 NOTE — Telephone Encounter (Signed)
ATC patient.  LMTCB. 

## 2020-08-26 NOTE — Telephone Encounter (Signed)
Spoke with patient, advised patient of Dr. Agustina Caroli recommendations.  Patient scheduled to see Dr. Lamonte Sakai on 08/31/20 at 10:45 for a qualifying walk.  He advised that his sats were holding in the 92% rang, he went to the pet store and picked up a bag of cat litter and his sats dropped to 74%.  Advised if he continued to get worse, he would need to go to the ER.  He verbalized understanding.  Nothing further needed.

## 2020-08-26 NOTE — Telephone Encounter (Signed)
Patient has been feeling congested for the last couple weeks but states he is more congested this morning and O2 is holding at 92%. Mentions that he is gargling.  States he felt congested yesterday but today is worse. Does not wear oxygen. Denies fever. Last OV was 07/16/20. Dry cough. States that his oxygen did really well during Pulmonary Rehab. Patient states he has been taking Cetirizine since the hospital. Informed patient that we may have to bring him into the office to do a walk to see if he needs oxygen with exertion. Patient expressed understanding. Told him that since he has congestion, gargling and cough I would send this to Dr. Lamonte Sakai to get any further recommendations.   Dr. Lamonte Sakai please advise

## 2020-08-26 NOTE — Telephone Encounter (Signed)
Pt returning a phone call. Pt can be reached at [phone].

## 2020-08-26 NOTE — Telephone Encounter (Signed)
He needs to come in for the walk test  Continue flonase and cetirizine.  Can add NSW once a day to help with the congestion and associated mucous / cough

## 2020-08-27 ENCOUNTER — Encounter (HOSPITAL_COMMUNITY)
Admission: RE | Admit: 2020-08-27 | Discharge: 2020-08-27 | Disposition: A | Discharge status: Home / Self Care | Payer: Medicare HMO | Source: Ambulatory Visit | Attending: Emergency Medicine | Admitting: Emergency Medicine

## 2020-08-27 ENCOUNTER — Other Ambulatory Visit: Payer: Self-pay

## 2020-08-27 VITALS — Wt 236.6 lb

## 2020-08-27 DIAGNOSIS — J449 Chronic obstructive pulmonary disease, unspecified: Secondary | ICD-10-CM | POA: Diagnosis not present

## 2020-08-27 NOTE — Progress Notes (Signed)
When Sean Saunders arrived today at Pulmonary Rehab he states that he has been having difficulty with keeping him oxygen saturations up with activity.He has a walk test scheduled with Dr. Agustina Caroli office 08/31/20 at 10:45 am. He reports saturations in the 50's to the 80's over the last couple of days with activity. He states that he feels nasal stuffiness like he is getting a sinus infection, SOB, slight aching in his left chest and some unsteadiness in his gait.His BP was elevated on arrival at 164/70, 170/60. When I questioned him about the chest aching he states that he associates it with his SOB and he has felt it the last month or two. Sean Carlton RN assessed  to his lungs and heard scattered rales and wheezing. We questioned the accuracy of his oxygen saturations with it being in the 50's and explained to him that he would pass out if it was that low. During exercise at Pulmonary Rehab his saturations have been 98-97% on room air. He exercised today and tolerated it well. He states that his chest aching was a "2", BP 152/54, room air saturation 97%,blood sugar 102. He left in stable condition.His gait is unsteady. Sean Saunders contacted his PCP Sean Saunders and got him an appointment with her 09/02/20 at 11 am. This will be a telephone visit.

## 2020-08-27 NOTE — Progress Notes (Signed)
Daily Session Note  Patient Details  Name: Sean Saunders MRN: [id number] Date of Birth: 02/24/1946 Referring Provider:     Pulmonary Rehab Walk Test from 08/11/2020 in [facility]  Referring Provider Dr. Byrum      Encounter Date: 08/27/2020  Check In:  Session Check In - 08/27/20 1109      Check-In   Supervising physician immediately available to respond to emergencies [facility] immediately available    Physician(s) Dr. Netty    Location [facility]    Staff Present Carlette Carlton, RN, BSN;Lisa Hughes, RN;Jessica Martin, MS, ACSM-CEP, Exercise Physiologist    Virtual Visit No    Medication changes reported     No    Fall or balance concerns reported    No    Tobacco Cessation No Change    Warm-up and Cool-down Performed on first and last piece of equipment    Resistance Training Performed Yes    VAD Patient? No    PAD/SET Patient? No      Pain Assessment   Currently in Pain? No/denies    Multiple Pain Sites No           Capillary Blood Glucose: No results found for this or any previous visit (from the past 24 hour(s)).    Social History   Tobacco Use  Smoking Status Former Smoker  . Packs/day: 1.00  . Years: 25.00  . Pack years: 25.00  . Types: Cigarettes  . Quit date: 06/05/2018  . Years since quitting: 2.2  Smokeless Tobacco Never Used    Goals Met:  Exercise tolerated well No report of cardiac concerns or symptoms Strength training completed today  Goals Unmet:  Not Applicable  Comments: Service time is from 1030 to 1210 See note   Dr. Traci Turner is Medical Director for Cardiac Rehab at [facility]. 

## 2020-08-31 ENCOUNTER — Encounter: Payer: Self-pay | Admitting: Emergency Medicine

## 2020-08-31 ENCOUNTER — Ambulatory Visit (INDEPENDENT_AMBULATORY_CARE_PROVIDER_SITE_OTHER): Payer: Medicare HMO | Admitting: Emergency Medicine

## 2020-08-31 ENCOUNTER — Other Ambulatory Visit: Payer: Self-pay

## 2020-08-31 VITALS — BP 150/68 | HR 86 | Temp 97.5°F | Ht 72.0 in | Wt 233.8 lb

## 2020-08-31 DIAGNOSIS — J449 Chronic obstructive pulmonary disease, unspecified: Secondary | ICD-10-CM | POA: Diagnosis not present

## 2020-08-31 DIAGNOSIS — Z23 Encounter for immunization: Secondary | ICD-10-CM

## 2020-08-31 DIAGNOSIS — J9611 Chronic respiratory failure with hypoxia: Secondary | ICD-10-CM | POA: Insufficient documentation

## 2020-08-31 DIAGNOSIS — J329 Chronic sinusitis, unspecified: Secondary | ICD-10-CM | POA: Diagnosis not present

## 2020-08-31 DIAGNOSIS — R061 Stridor: Secondary | ICD-10-CM | POA: Insufficient documentation

## 2020-08-31 MED ORDER — IRBESARTAN 150 MG PO TABS
150.0000 mg | ORAL_TABLET | Freq: Every day | ORAL | 3 refills | Status: DC
Start: 2020-08-31 — End: 2020-09-16

## 2020-08-31 MED ORDER — TRELEGY ELLIPTA 100-62.5-25 MCG/INH IN AEPB
1.0000 | INHALATION_SPRAY | Freq: Every day | RESPIRATORY_TRACT | 2 refills | Status: DC
Start: 2020-08-31 — End: 2021-01-27

## 2020-08-31 NOTE — Assessment & Plan Note (Signed)
He participates in pulmonary rehab and they have not caught any hypoxemia during his workouts.  That being said he has seen desaturations with activity, working outside, carrying groceries.  He needs a repeat walking oximetry today to ensure no desaturation.  If he qualifies then we will obtain supplemental oxygen for him.

## 2020-08-31 NOTE — Addendum Note (Signed)
Addended by: Gavin Potters R on: 08/31/2020 12:03 PM   Modules accepted: Orders

## 2020-08-31 NOTE — Assessment & Plan Note (Signed)
Continue your Zyrtec once daily Continue your fluticasone nasal spray (Flonase) 2 sprays each nostril 1-2 times daily. Start Mucinex (guaifenesin) 600 mg once daily

## 2020-08-31 NOTE — Progress Notes (Signed)
Subjective:    Patient ID: Sean Saunders, male    DOB: 25-Jun-1946, 74 y.o.   MRN: [id number]  HPI  ROV 07/05/18 --74 year old man with a history of tobacco use and severe obstructive lung disease based on his pulmonary function testing.  At his last visit we started him on Trelegy to see if you benefit.  He returns today reporting that it has helped him significantly.  Also of note he has stopped smoking as of 1 month ago. He has some intermittent rhinitis, flonase prn, uses loratadine prn. He coughs clear mucous - helps his breathing.  No wheeze. Better walk distance, functional capacity. He has combivent to use prn - never uses.    ROV 09/03/2019 --follow-up visit for 74 year old man with a history of tobacco and severe obstructive lung disease.  He returns today reporting that he does most of the shopping, the household chores. He has to stop after 50 yrds to walk the dog. He remains on Trelegy. He has been dealing with a chronic sinusitis - started Augmentin 3 days ago. Prior to that he was treated with another abx and steroids, completed 6weeks ago. Headache, ear fullness.  Has uses albuterol nebs - unclear that it has helped his breathing he has heard some wheeze. He has not restarted smoking. On flonase, atrovent NS.  Unfortunately his wife has been very ill with metastatic lung CA.   ROV 08/31/20 --73 year old man with a history of severe obstructive lung disease based on his PFT, allergic rhinitis.  He had acute flare of his COPD in July 2021 and was hospitalized.  He has been doing cardiopulmonary rehab, has not had any formal desats on RA but he has seen Spo2 in the 70's on his own oximeter. He was on Trelegy at our last visit. After the hospitalization he was changed to [place], but he is only using one at a time. He has a lot of throat mucous, unsure color. Stable SOB. He is on zyrtec and flonase consistently    Review of Systems  Constitutional: Negative for fever and  unexpected weight change.  HENT: Negative for congestion, dental problem, ear pain, nosebleeds, postnasal drip, rhinorrhea, sinus pressure, sneezing, sore throat and trouble swallowing.   Eyes: Negative for redness and itching.  Respiratory: Positive for cough, chest tightness, shortness of breath and wheezing.   Cardiovascular: Negative for palpitations and leg swelling.  Gastrointestinal: Negative for nausea and vomiting.  Genitourinary: Negative for dysuria.  Musculoskeletal: Negative for joint swelling.  Skin: Negative for rash.  Neurological: Negative for headaches.  Hematological: Does not bruise/bleed easily.  Psychiatric/Behavioral: Negative for dysphoric mood. The patient is not nervous/anxious.        Objective:   Physical Exam Vitals:   08/31/20 1045  BP: (!) 150/68  Pulse: 86  Temp: (!) 97.5 F (36.4 C)  TempSrc: Temporal  SpO2: 98%  Weight: 233 lb 12.8 oz (106.1 kg)  Height: 6' (1.829 m)   Gen: Pleasant, obese man, in no distress,  normal affect  ENT: No lesions,  mouth clear,  oropharynx clear, no postnasal drip  Neck: No JVD, loud upper airway noise, stridor and send secretions  Lungs: No use of accessory muscles, distant, distant but no wheezing, just referred upper airway noise  Cardiovascular: RRR, heart sounds normal, no murmur or gallops, no peripheral edema  Musculoskeletal: No deformities, no cyanosis or clubbing  Neuro: alert, non focal  Skin: Warm, no lesions or rash      Assessment &  Plan:  COPD (chronic obstructive pulmonary disease) ([place]) Stop your Advair and Spiriva Restart Trelegy 1 inhalation once daily.  Rinse and gargle after using. Keep Combivent available to use 2 puffs up to every 6 hours if needed for shortness of breath, chest tightness, wheezing. Try using a flutter valve to see if it helps you clear your mucus from her throat. Flu shot Follow with Dr. Lamonte Sakai in 2 months or sooner if you have any problems.  Chronic  sinusitis Continue your Zyrtec once daily Continue your fluticasone nasal spray (Flonase) 2 sprays each nostril 1-2 times daily. Start Mucinex (guaifenesin) 600 mg once daily  Stridor Upper airway noise and difficulty managing secretions.  Sounds like pooling mucus, saliva.  He denies any aspiration symptoms.  He is not able to cough out completely.  We will try to help manage rhinitis, drainage.  Mucinex may help as well.  I will stop his ACE inhibitor, change him to irbesartan.  Try flutter valve  Chronic respiratory failure with hypoxia ([place]) He participates in pulmonary rehab and they have not caught any hypoxemia during his workouts.  That being said he has seen desaturations with activity, working outside, carrying groceries.  He needs a repeat walking oximetry today to ensure no desaturation.  If he qualifies then we will obtain supplemental oxygen for him.  Baltazar Apo, MD, PhD 08/31/2020, 11:20 AM [facility] [phone] or if no answer [phone]

## 2020-08-31 NOTE — Assessment & Plan Note (Signed)
Stop your Advair and Spiriva Restart Trelegy 1 inhalation once daily.  Rinse and gargle after using. Keep Combivent available to use 2 puffs up to every 6 hours if needed for shortness of breath, chest tightness, wheezing. Try using a flutter valve to see if it helps you clear your mucus from her throat. Flu shot Follow with Dr. Lamonte Sakai in 2 months or sooner if you have any problems.

## 2020-08-31 NOTE — Assessment & Plan Note (Signed)
Upper airway noise and difficulty managing secretions.  Sounds like pooling mucus, saliva.  He denies any aspiration symptoms.  He is not able to cough out completely.  We will try to help manage rhinitis, drainage.  Mucinex may help as well.  I will stop his ACE inhibitor, change him to irbesartan.  Try flutter valve

## 2020-08-31 NOTE — Patient Instructions (Signed)
Stop your Advair and Spiriva Restart Trelegy 1 inhalation once daily.  Rinse and gargle after using. Keep Combivent available to use 2 puffs up to every 6 hours if needed for shortness of breath, chest tightness, wheezing. Continue your Zyrtec once daily Continue your fluticasone nasal spray (Flonase) 2 sprays each nostril 1-2 times daily. Start Mucinex (guaifenesin) 600 mg once daily Try using a flutter valve to see if it helps you clear your mucus from her throat. Stop lisinopril Start irbesartan 150 mg once daily. Walking oximetry today on room air. Flu shot Follow with Dr. Lamonte Sakai in 2 months or sooner if you have any problems.

## 2020-09-01 ENCOUNTER — Encounter (HOSPITAL_COMMUNITY): Payer: Medicare HMO

## 2020-09-02 ENCOUNTER — Encounter: Payer: Self-pay | Admitting: Family Medicine

## 2020-09-02 ENCOUNTER — Telehealth (INDEPENDENT_AMBULATORY_CARE_PROVIDER_SITE_OTHER): Payer: Medicare HMO | Admitting: Family Medicine

## 2020-09-02 VITALS — Ht 72.0 in

## 2020-09-02 DIAGNOSIS — J449 Chronic obstructive pulmonary disease, unspecified: Secondary | ICD-10-CM | POA: Diagnosis not present

## 2020-09-02 DIAGNOSIS — H6983 Other specified disorders of Eustachian tube, bilateral: Secondary | ICD-10-CM

## 2020-09-02 DIAGNOSIS — R112 Nausea with vomiting, unspecified: Secondary | ICD-10-CM

## 2020-09-02 DIAGNOSIS — R1033 Periumbilical pain: Secondary | ICD-10-CM | POA: Diagnosis not present

## 2020-09-02 MED ORDER — ONDANSETRON HCL 4 MG PO TABS: 4.0000 mg | ORAL_TABLET | Freq: Three times a day (TID) | ORAL | 0 refills | Status: AC | PRN

## 2020-09-03 ENCOUNTER — Encounter (HOSPITAL_COMMUNITY)
Admission: RE | Admit: 2020-09-03 | Discharge: 2020-09-03 | Disposition: A | Discharge status: Home / Self Care | Payer: Medicare HMO | Source: Ambulatory Visit | Attending: Emergency Medicine | Admitting: Emergency Medicine

## 2020-09-03 ENCOUNTER — Telehealth: Payer: Self-pay | Admitting: Emergency Medicine

## 2020-09-03 ENCOUNTER — Other Ambulatory Visit: Payer: Self-pay

## 2020-09-03 DIAGNOSIS — J449 Chronic obstructive pulmonary disease, unspecified: Secondary | ICD-10-CM | POA: Diagnosis not present

## 2020-09-03 MED ORDER — IRBESARTAN 150 MG PO TABS
150.0000 mg | ORAL_TABLET | Freq: Every day | ORAL | 3 refills | Status: DC
Start: 2020-09-03 — End: 2021-05-02

## 2020-09-03 NOTE — Progress Notes (Signed)
Daily Session Note  Patient Details  Name: Sean Saunders MRN: [id number] Date of Birth: 06/29/1946 Referring Provider:     Pulmonary Rehab Walk Test from 08/11/2020 in [place]  Referring Provider Dr. Lamonte Sakai      Encounter Date: 09/03/2020  Check In:  Session Check In - 09/03/20 1147      Check-In   Supervising physician immediately available to respond to emergencies [facility] immediately available    Physician(s) Dr. Darrick Grinder    Location [facility]    Staff Present Maurice Small, RN, [name], MS, ACSM-CEP, Exercise Physiologist;Annedrea Rosezella [place], RN, Ramonita Lab, RN    Virtual Visit No    Medication changes reported     No    Tobacco Cessation No Change    Warm-up and Cool-down Performed on first and last piece of equipment    Resistance Training Performed Yes    VAD Patient? No    PAD/SET Patient? No      Pain Assessment   Currently in Pain? No/denies    Multiple Pain Sites No           Capillary Blood Glucose: No results found for this or any previous visit (from the past 24 hour(s)).    Social History   Tobacco Use  Smoking Status Former Smoker  . Packs/day: 1.00  . Years: 25.00  . Pack years: 25.00  . Types: Cigarettes  . Quit date: 06/05/2018  . Years since quitting: 2.2  Smokeless Tobacco Never Used    Goals Met:  Exercise tolerated well No report of cardiac concerns or symptoms Strength training completed today  Goals Unmet:  Not Applicable  Comments: Service time is from 1040 to 1200    Dr. Fransico Him is Medical Director for Cardiac Rehab at [facility].

## 2020-09-03 NOTE — Telephone Encounter (Signed)
rx sent in error I hav corrected and apologized to the for for the inconvenience

## 2020-09-05 ENCOUNTER — Encounter: Payer: Self-pay | Admitting: Family Medicine

## 2020-09-05 NOTE — Progress Notes (Signed)
Virtual Visit via Video Note I connected with Sean Saunders on 09/02/20 by a video enabled telemedicine application and verified that I am speaking with the correct person using two identifiers.  Location patient: home Location provider:work office Persons participating in the virtual visit: patient, provider  I discussed the limitations of evaluation and management by telemedicine and the availability of in person appointments. The patient expressed understanding and agreed to proceed.  HPI: Sean Saunders is a 74 yo male with hx of COPD,DM II,HLD,HTN,PAD,and GERD c/o 3 days of abdominal pain,nausea,and vomiting. He thinks he has food poison. Vomiting x 4 days one. Problem has improved.  A day before problem started she ate smoked [place] that he thinks caused symptoms. He has not had fever,chills,sore throat, diarrhea,blood in stool,melena,or urinary symptoms. Last episode of vomiting was yesterday.Periumbilical abdominal cramps, 4/10,intermittent, and no radiated.  Abdominal pain is alleviated by passing gas, no changes with food intake. Still nauseated.  Also c/o productive cough with clear sputum. No hemoptysis. He was treated with abx but no much improvement. He was evaluated by his pulmonologist yesterday and his COPD meds were adjusted. Started on Trillegy and started pulmonary rehab. He is not longer smoking  Nasal congestion,post nasal drainage, and ear fullness sensation. Negative for CP, worsening, SOB or wheezing.  DM II: Glucose "up and down" 60's to 190's.   ROS: See pertinent positives and negatives per HPI.  Past Medical History:  Diagnosis Date  . Arthritis   . COPD (chronic obstructive pulmonary disease) ([place])   . Diabetes mellitus   . DVT (deep venous thrombosis) ([place])   . GERD (gastroesophageal reflux disease)   . Hyperlipidemia   . Hypertension   . Hypothyroidism   . Onychomycosis 06/07/2013  . Stroke [facility])    TIA's 01/05/11-01/08/11-01/26/11  . TIA (transient  ischemic attack) 08/03/2011   Left sided weakness. 3 episodes Feb-March    Past Surgical History:  Procedure Laterality Date  . ABDOMINAL AORTAGRAM N/A 06/04/2012   Procedure: ABDOMINAL Maxcine Ham;  Surgeon: Angelia Mould, MD;  Location: [facility] CATH LAB;  Service: Cardiovascular;  Laterality: N/A;  . APPENDECTOMY    . COLONOSCOPY    . FEMORAL-POPLITEAL BYPASS GRAFT  07/05/2012   Procedure: BYPASS GRAFT FEMORAL-POPLITEAL ARTERY;  Surgeon: Angelia Mould, MD;  Location: [facility] OR;  Service: Vascular;  Laterality: Left;  Revision Left fem/pop w/ artery patch angioplasty utilizing left GSV graft and extenion to tib/per trunk w/right GSV and interop arteriogram  . JOINT REPLACEMENT  2011   -Hip  . NASAL SINUS SURGERY    . PR VEIN BYPASS GRAFT,AORTO-FEM-POP    . PR VEIN BYPASS GRAFT,AORTO-FEM-POP  08-01-11   (L) Fem-pop BP  . toe nail    . TONSILLECTOMY      Family History  Problem Relation Age of Onset  . Stroke Mother   . Diabetes Mother   . Other Father        alzheimers  . Diabetes Father   . Stroke Father   . Diabetes Sister     Social History   Socioeconomic History  . Marital status: Widowed    Spouse name: Not on file  . Number of children: 1  . Years of education: Not on file  . Highest education level: Master's degree (e.g., MA, MS, MEng, MEd, MSW, MBA)  Occupational History  . Occupation: retired  Tobacco Use  . Smoking status: Former Smoker    Packs/day: 1.00    Years: 25.00    Pack years: 25.00  Types: Cigarettes    Quit date: 06/05/2018    Years since quitting: 2.2  . Smokeless tobacco: Never Used  Vaping Use  . Vaping Use: Never used  Substance and Sexual Activity  . Alcohol use: Not Currently    Alcohol/week: 0.0 standard drinks    Comment: rare  . Drug use: No  . Sexual activity: Not on file  Other Topics Concern  . Not on file  Social History Narrative  . Not on file   Social Determinants of Health   Financial Resource Strain: Low Risk    . Difficulty of Paying Living Expenses: Not hard at all  Food Insecurity: No Food Insecurity  . Worried About Charity fundraiser in the Last Year: Never true  . Ran Out of Food in the Last Year: Never true  Transportation Needs: No Transportation Needs  . Lack of Transportation (Medical): No  . Lack of Transportation (Non-Medical): No  Physical Activity: Inactive  . Days of Exercise per Week: 0 days  . Minutes of Exercise per Session: 0 min  Stress: No Stress Concern Present  . Feeling of Stress : Only a little  Social Connections: Moderately Isolated  . Frequency of Communication with Friends and Family: More than three times a week  . Frequency of Social Gatherings with Friends and Family: Twice a week  . Attends Religious Services: More than 4 times per year  . Active Member of Clubs or Organizations: No  . Attends Archivist Meetings: Never  . Marital Status: Widowed  Intimate Partner Violence: Not At Risk  . Fear of Current or Ex-Partner: No  . Emotionally Abused: No  . Physically Abused: No  . Sexually Abused: No    Current Outpatient Medications:  .  allopurinol (ZYLOPRIM) 100 MG tablet, Take 100 mg by mouth 2 (two) times daily., Disp: , Rfl:  .  amLODipine (NORVASC) 10 MG tablet, Take 10 mg by mouth daily., Disp: , Rfl:  .  cetirizine (ZYRTEC ALLERGY) 10 MG tablet, Take 1 tablet (10 mg total) by mouth daily., Disp: 30 tablet, Rfl: 5 .  clopidogrel (PLAVIX) 75 MG tablet, Take 75 mg by mouth daily.  , Disp: , Rfl:  .  COMBIVENT RESPIMAT 20-100 MCG/ACT AERS respimat, Inhale 2 puffs into the lungs 3 (three) times daily. , Disp: , Rfl:  .  fluticasone (FLONASE) 50 MCG/ACT nasal spray, Place 2 sprays into both nostrils daily., Disp: 16 g, Rfl: 5 .  Fluticasone-Umeclidin-Vilant (TRELEGY ELLIPTA) 100-62.5-25 MCG/INH AEPB, Inhale 1 puff into the lungs daily., Disp: 60 each, Rfl: 2 .  furosemide (LASIX) 20 MG tablet, Take 20 mg by mouth every other day. , Disp: , Rfl:   .  hydrALAZINE (APRESOLINE) 50 MG tablet, Take 50 mg by mouth every morning., Disp: , Rfl:  .  hydrochlorothiazide (HYDRODIURIL) 25 MG tablet, Take 25 mg by mouth daily., Disp: , Rfl:  .  insulin regular human CONCENTRATED (HUMULIN R) 500 UNIT/ML injection, Inject 110-140 Units into the skin See admin instructions. 110 units at breakfast, 140 units at lunch, 110 units at dinner, Disp: , Rfl:  .  irbesartan (AVAPRO) 150 MG tablet, Take 1 tablet (150 mg total) by mouth daily., Disp: 1 tablet, Rfl: 3 .  levothyroxine (SYNTHROID, LEVOTHROID) 150 MCG tablet, Take 150 mcg by mouth daily before breakfast., Disp: , Rfl:  .  lisinopril (ZESTRIL) 40 MG tablet, Take 40 mg by mouth daily., Disp: , Rfl:  .  metFORMIN (GLUCOPHAGE) 500 MG tablet, Take  500 mg by mouth 2 (two) times daily with a meal., Disp: , Rfl:  .  Multiple Vitamin (MULTIVITAMIN WITH MINERALS) TABS tablet, Take 1 tablet by mouth daily., Disp: , Rfl:  .  rOPINIRole (REQUIP) 0.25 MG tablet, Take 0.25 mg by mouth at bedtime., Disp: , Rfl:  .  rosuvastatin (CRESTOR) 40 MG tablet, Take 20 mg by mouth daily. , Disp: , Rfl:  .  sertraline (ZOLOFT) 25 MG tablet, Take 1 tablet (25 mg total) by mouth daily., Disp: 30 tablet, Rfl: 0 .  tamsulosin (FLOMAX) 0.4 MG CAPS capsule, Take 0.4 mg by mouth., Disp: , Rfl:  .  irbesartan (AVAPRO) 150 MG tablet, Take 1 tablet (150 mg total) by mouth daily., Disp: 30 tablet, Rfl: 3 .  ondansetron (ZOFRAN) 4 MG tablet, Take 1 tablet (4 mg total) by mouth every 8 (eight) hours as needed for up to 4 days for nausea or vomiting., Disp: 12 tablet, Rfl: 0  EXAM:  VITALS per patient if applicable:Ht 6' (2.025 m)   SpO2 96%   BMI 31.71 kg/m   GENERAL: alert, oriented, appears well and in no acute distress  HEENT: atraumatic, conjunctiva clear, no obvious abnormalities on inspection.  NECK: normal movements of the head and neck  LUNGS: on inspection no signs of respiratory distress, breathing rate appears normal, no  obvious gross SOB, gasping or wheezing. Productive cough a couple times during the visit.  CV: no obvious cyanosis  PSYCH/NEURO: pleasant and cooperative, no obvious depression or anxiety, speech and thought processing grossly intact  ASSESSMENT AND PLAN:  Discussed the following assessment and plan:  Nausea and vomiting in adult - Plan: ondansetron (ZOFRAN) 4 MG tablet Vomiting resolved. Monitor for new symptoms. Adequate hydration. Zofran 4 mg tid prn. Instructed about warning signs.  Periumbilical abdominal pain We discussed possible etiologies, it does not seem to be infectious. Improving. Instructed about warning signs.  Chronic obstructive pulmonary disease, unspecified COPD type ([place]) Treatment changed. Following with pulmonologist. Plain mucinex may help.  Dysfunction of both eustachian tubes Autoinflation maneuvers a few times per day may help. Recommend holding on Cetirizine for a few days.   I discussed the assessment and treatment plan with the patient. Sean Sitzer was provided an opportunity to ask questions and all were answered.He agreed with the plan and demonstrated an understanding of the instructions.   Return if symptoms worsen or fail to improve.   Shaunita Seney [place], MD

## 2020-09-08 ENCOUNTER — Encounter (HOSPITAL_COMMUNITY)
Admission: RE | Admit: 2020-09-08 | Discharge: 2020-09-08 | Disposition: A | Discharge status: Home / Self Care | Payer: Medicare HMO | Source: Ambulatory Visit | Attending: Emergency Medicine | Admitting: Emergency Medicine

## 2020-09-08 ENCOUNTER — Other Ambulatory Visit: Payer: Self-pay

## 2020-09-08 DIAGNOSIS — J449 Chronic obstructive pulmonary disease, unspecified: Secondary | ICD-10-CM | POA: Diagnosis not present

## 2020-09-08 NOTE — Progress Notes (Signed)
Daily Session Note  Patient Details  Name: Sean Saunders MRN: [id number] Date of Birth: 1946-01-01 Referring Provider:     Pulmonary Rehab Walk Test from 08/11/2020 in [place]  Referring Provider Dr. Lamonte Sakai      Encounter Date: 09/08/2020  Check In:  Session Check In - 09/08/20 1107      Check-In   Supervising physician immediately available to respond to emergencies [facility] immediately available    Physician(s) Dr. Darliss Cheney    [facility]    Staff Present [name], MS, CEP, Exercise Physiologist;[name], RN, [name], MS, ACSM-CEP, Exercise Physiologist    Virtual Visit No    Medication changes reported     No    Fall or balance concerns reported    No    Tobacco Cessation No Change    Warm-up and Cool-down Performed on first and last piece of equipment    Resistance Training Performed Yes    VAD Patient? No    PAD/SET Patient? No      Pain Assessment   Currently in Pain? No/denies    Multiple Pain Sites No           Capillary Blood Glucose: No results found for this or any previous visit (from the past 24 hour(s)).    Social History   Tobacco Use  Smoking Status Former Smoker  . Packs/day: 1.00  . Years: 25.00  . Pack years: 25.00  . Types: Cigarettes  . Quit date: 06/05/2018  . Years since quitting: 2.2  Smokeless Tobacco Never Used    Goals Met:  Independence with exercise equipment Exercise tolerated well No report of cardiac concerns or symptoms Strength training completed today  Goals Unmet:  Not Applicable  Comments: Service time is from 1030 to 1143    Dr. Fransico Him is Medical Director for Cardiac Rehab at [facility].

## 2020-09-08 NOTE — Progress Notes (Signed)
Nutrition Note - Follow Up  Spoke with pt about diabetes management.  Reports low in 50's early this morning. He ate 6 store bought cookies, 2 glasses of sweet tea, and handful of raisins.  CBG >200 mg/dl. He took insulin around 9:30 am without food.  He guesses insulin:carb ratio when taking insulin. He does not count carbohydrates and tries to avoid.  He feels everything increases his CBGs, not just carbs. He has never been taught the hypoglycemia protocol and typically eats a large amount of carbs when <70 mg/dl. Reviewed hypoglycemia protocol and rule of 15's.  He does not take insulin with meals. He typically takes it a few hours after meal when CBG peaks. Reviewed meal time insulin dosage. He is prescribed 110-140 mg/dl each meal but has been taking 40-50 mg/dl at each meal because he had been having low CBGs.   Will continue to follow up with pt to ensure pt meets diabetes self management goals.  Michaele Offer, MS, RDN, LDN

## 2020-09-10 ENCOUNTER — Encounter (HOSPITAL_COMMUNITY)
Admission: RE | Admit: 2020-09-10 | Discharge: 2020-09-10 | Disposition: A | Discharge status: Home / Self Care | Payer: Medicare HMO | Source: Ambulatory Visit | Attending: Emergency Medicine | Admitting: Emergency Medicine

## 2020-09-10 ENCOUNTER — Other Ambulatory Visit: Payer: Self-pay

## 2020-09-10 DIAGNOSIS — J449 Chronic obstructive pulmonary disease, unspecified: Secondary | ICD-10-CM | POA: Diagnosis not present

## 2020-09-10 NOTE — Progress Notes (Signed)
Nutrition Note - Follow up  Spoke with pt today about carbohydrate counting, and eating a consistent amount of carbohydrates across the day. Reviewed the benefits of carbohydrate counting and eating a consistent amount of carbohydrates across the day. Showed pt how to calculate carbohydrate servings, and distributed handouts for patient to practice. Discussed the importance of creating a balanced meal with the addition of protein and non-starchy vegetables. Distributed recipes and snack ideas to patient to try. Additionally discussed with patient that exercise may cause blood sugar to decrease and that we may need to add in a snack before or after workout, to manage any changes. Distributed handout of snack ideas that would provide a combination of carbohydrates and protein. Pt verbalized understanding of material discussed today. Distributed RD contact information.     Michaele Offer, MS, RDN, LDN

## 2020-09-10 NOTE — Progress Notes (Signed)
Daily Session Note  Patient Details  Name: YURIEL LOPEZMARTINEZ MRN: [id number] Date of Birth: 21-Jul-1946 Referring Provider:     Pulmonary Rehab Walk Test from 08/11/2020 in [facility]  Referring Provider Dr. Lamonte Sakai      Encounter Date: 09/10/2020  Check In:  Session Check In - 09/10/20 1046      Check-In   Supervising physician immediately available to respond to emergencies [facility] immediately available    Physician(s) Dr. Darliss Cheney    Location [facility]    Staff Present [name], MS, CEP, Exercise Physiologist;[name], RN, [name], MS, ACSM-CEP, Exercise Physiologist    Virtual Visit No    Medication changes reported     No    Fall or balance concerns reported    No    Tobacco Cessation No Change    Warm-up and Cool-down Performed on first and last piece of equipment    Resistance Training Performed Yes    VAD Patient? No    PAD/SET Patient? No      Pain Assessment   Currently in Pain? No/denies    Multiple Pain Sites No           Capillary Blood Glucose: No results found for this or any previous visit (from the past 24 hour(s)).    Social History   Tobacco Use  Smoking Status Former Smoker  . Packs/day: 1.00  . Years: 25.00  . Pack years: 25.00  . Types: Cigarettes  . Quit date: 06/05/2018  . Years since quitting: 2.2  Smokeless Tobacco Never Used    Goals Met:  Independence with exercise equipment Exercise tolerated well No report of cardiac concerns or symptoms Strength training completed today  Goals Unmet:  Not Applicable  Comments: Service time is from 1035 to 1140    Dr. Fransico Him is Medical Director for Cardiac Rehab at [facility].

## 2020-09-10 NOTE — Progress Notes (Signed)
Pulmonary Individual Treatment Plan  Patient Details  Name: Sean Saunders MRN: [id number] Date of Birth: 09-19-1946 Referring Provider:     Pulmonary Rehab Walk Test from 08/11/2020 in [facility]  Referring Provider Dr. Lamonte Sakai      Initial Encounter Date:    Pulmonary Rehab Walk Test from 08/11/2020 in [facility]  Date 08/11/20      Visit Diagnosis: Chronic obstructive pulmonary disease, unspecified COPD type ([place])  Patient's Home Medications on Admission:   Current Outpatient Medications:  .  allopurinol (ZYLOPRIM) 100 MG tablet, Take 100 mg by mouth 2 (two) times daily., Disp: , Rfl:  .  amLODipine (NORVASC) 10 MG tablet, Take 10 mg by mouth daily., Disp: , Rfl:  .  cetirizine (ZYRTEC ALLERGY) 10 MG tablet, Take 1 tablet (10 mg total) by mouth daily., Disp: 30 tablet, Rfl: 5 .  clopidogrel (PLAVIX) 75 MG tablet, Take 75 mg by mouth daily.  , Disp: , Rfl:  .  COMBIVENT RESPIMAT 20-100 MCG/ACT AERS respimat, Inhale 2 puffs into the lungs 3 (three) times daily. , Disp: , Rfl:  .  fluticasone (FLONASE) 50 MCG/ACT nasal spray, Place 2 sprays into both nostrils daily., Disp: 16 g, Rfl: 5 .  Fluticasone-Umeclidin-Vilant (TRELEGY ELLIPTA) 100-62.5-25 MCG/INH AEPB, Inhale 1 puff into the lungs daily., Disp: 60 each, Rfl: 2 .  furosemide (LASIX) 20 MG tablet, Take 20 mg by mouth every other day. , Disp: , Rfl:  .  hydrALAZINE (APRESOLINE) 50 MG tablet, Take 50 mg by mouth every morning., Disp: , Rfl:  .  hydrochlorothiazide (HYDRODIURIL) 25 MG tablet, Take 25 mg by mouth daily., Disp: , Rfl:  .  insulin regular human CONCENTRATED (HUMULIN R) 500 UNIT/ML injection, Inject 110-140 Units into the skin See admin instructions. 110 units at breakfast, 140 units at lunch, 110 units at dinner, Disp: , Rfl:  .  irbesartan (AVAPRO) 150 MG tablet, Take 1 tablet (150 mg total) by mouth daily., Disp: 1 tablet, Rfl: 3 .  irbesartan (AVAPRO)  150 MG tablet, Take 1 tablet (150 mg total) by mouth daily., Disp: 30 tablet, Rfl: 3 .  levothyroxine (SYNTHROID, LEVOTHROID) 150 MCG tablet, Take 150 mcg by mouth daily before breakfast., Disp: , Rfl:  .  lisinopril (ZESTRIL) 40 MG tablet, Take 40 mg by mouth daily., Disp: , Rfl:  .  metFORMIN (GLUCOPHAGE) 500 MG tablet, Take 500 mg by mouth 2 (two) times daily with a meal., Disp: , Rfl:  .  Multiple Vitamin (MULTIVITAMIN WITH MINERALS) TABS tablet, Take 1 tablet by mouth daily., Disp: , Rfl:  .  rOPINIRole (REQUIP) 0.25 MG tablet, Take 0.25 mg by mouth at bedtime., Disp: , Rfl:  .  rosuvastatin (CRESTOR) 40 MG tablet, Take 20 mg by mouth daily. , Disp: , Rfl:  .  sertraline (ZOLOFT) 25 MG tablet, Take 1 tablet (25 mg total) by mouth daily., Disp: 30 tablet, Rfl: 0 .  tamsulosin (FLOMAX) 0.4 MG CAPS capsule, Take 0.4 mg by mouth., Disp: , Rfl:   Past Medical History: Past Medical History:  Diagnosis Date  . Arthritis   . COPD (chronic obstructive pulmonary disease) ([place])   . Diabetes mellitus   . DVT (deep venous thrombosis) ([place])   . GERD (gastroesophageal reflux disease)   . Hyperlipidemia   . Hypertension   . Hypothyroidism   . Onychomycosis 06/07/2013  . Stroke [facility])    TIA's 01/05/11-01/08/11-01/26/11  . TIA (transient ischemic attack) 08/03/2011  Left sided weakness. 3 episodes Feb-March    Tobacco Use: Social History   Tobacco Use  Smoking Status Former Smoker  . Packs/day: 1.00  . Years: 25.00  . Pack years: 25.00  . Types: Cigarettes  . Quit date: 06/05/2018  . Years since quitting: 2.2  Smokeless Tobacco Never Used    Labs: Recent Review Flowsheet Data    Labs for ITP Cardiac and Pulmonary Rehab Latest Ref Rng & Units 06/04/2012 12/31/2019 01/01/2020 06/27/2020 06/29/2020   Cholestrol 0 - 200 mg/dL - - - - -   LDLCALC 0 - 99 mg/dL - - - - -   HDL >39 mg/dL - - - - -   Trlycerides <150 mg/dL - 236(H) 194(H) - -   Hemoglobin A1c 4.8 - 5.6 % - 7.7(H) - 8.2(H) -   PHART  7.35 - 7.45 - - - - 7.437   PCO2ART 32 - 48 mmHg - - - - 37.3   HCO3 20.0 - 28.0 mmol/L - - - - 24.8   TCO2 0 - 100 mmol/L 21 - - - -   O2SAT % - - - - 96.3      Capillary Blood Glucose: Lab Results  Component Value Date   GLUCAP 86 08/20/2020   GLUCAP 137 (H) 08/20/2020   GLUCAP 298 (H) 07/01/2020   GLUCAP 300 (H) 07/01/2020   GLUCAP 292 (H) 07/01/2020    POCT Glucose    Row Name 08/11/20 1510 08/18/20 1233 08/27/20 1445 09/08/20 1511       POCT Blood Glucose   Pre-Exercise 246 mg/dL 196 mg/dL  Fasting 250 -- --    Post-Exercise -- 165 mg/dL -- --    Pre-Exercise #2 -- -- 171 mg/dL --    Post-Exercise #2 -- -- 102 mg/dL --    Pre-Exercise #3 -- -- -- 204 mg/dL    Post-Exercise #3 -- -- -- 99 mg/dL           Pulmonary Assessment Scores:  Pulmonary Assessment Scores    Row Name 08/11/20 1157 08/11/20 1543       ADL UCSD   SOB Score total 58 --      CAT Score   CAT Score 21 --      mMRC Score   mMRC Score -- 4          UCSD: Self-administered rating of dyspnea associated with activities of daily living (ADLs) 6-point scale (0 = "not at all" to 5 = "maximal or unable to do because of breathlessness")  Scoring Scores range from 0 to 120.  Minimally important difference is 5 units  CAT: CAT can identify the health impairment of COPD patients and is better correlated with disease progression.  CAT has a scoring range of zero to 40. The CAT score is classified into four groups of low (less than 10), medium (10 - 20), high (21-30) and very high (31-40) based on the impact level of disease on health status. A CAT score over 10 suggests significant symptoms.  A worsening CAT score could be explained by an exacerbation, poor medication adherence, poor inhaler technique, or progression of COPD or comorbid conditions.  CAT MCID is 2 points  mMRC: mMRC (Modified Medical Research Council) Dyspnea Scale is used to assess the degree of baseline functional disability in  patients of respiratory disease due to dyspnea. No minimal important difference is established. A decrease in score of 1 point or greater is considered a positive change.   Pulmonary Function  Assessment:  Pulmonary Function Assessment - 08/11/20 1508      Breath   Bilateral Breath Sounds Rhonchi;Decreased   A few scatteried rhonchi and decreased breath sounds throughout   Shortness of Breath Yes;Limiting activity           Exercise Target Goals: Exercise Program Goal: Individual exercise prescription set using results from initial 6 min walk test and THRR while considering  patient's activity barriers and safety.   Exercise Prescription Goal: Initial exercise prescription builds to 30-45 minutes a day of aerobic activity, 2-3 days per week.  Home exercise guidelines will be given to patient during program as part of exercise prescription that the participant will acknowledge.  Activity Barriers & Risk Stratification:  Activity Barriers & Cardiac Risk Stratification - 08/11/20 0920      Activity Barriers & Cardiac Risk Stratification   Activity Barriers Right Hip Replacement;Joint Problems;Deconditioning;Shortness of Breath;Muscular Weakness    Cardiac Risk Stratification High           6 Minute Walk:  6 Minute Walk    Row Name 08/11/20 1543         6 Minute Walk   Distance 681 feet     Walk Time 6 minutes     # of Rest Breaks 1  pt had to stop at minute 4 due to severe shortness of breath and feeling light headed     MPH 1.29     METS 1.59     RPE 12     Perceived Dyspnea  3     VO2 Peak 5.56     Symptoms Yes (comment)     Comments pt started becoming wobbly and unstable around the 4th minute of the walk test. Had him sit down at that time to check vitals and asses how he was feeling. Pt stated he was very short of breath and finally admitted to being somewhat light headed. we monitored his vitals and everything was normal. pt stopped walk test at 4 minutes.      Resting HR 81 bpm     Resting BP 126/62     Resting Oxygen Saturation  97 %     Exercise Oxygen Saturation  during 6 min walk 96 %     Max Ex. HR 97 bpm     Max Ex. BP 164/54     2 Minute Post BP 132/58       Interval HR   1 Minute HR 91     2 Minute HR 96     3 Minute HR 97     4 Minute HR 96     5 Minute HR 88     6 Minute HR 88     2 Minute Post HR 84     Interval Heart Rate? Yes       Interval Oxygen   Interval Oxygen? Yes     Baseline Oxygen Saturation % 97 %     1 Minute Oxygen Saturation % 96 %     1 Minute Liters of Oxygen 0 L     2 Minute Oxygen Saturation % 97 %     2 Minute Liters of Oxygen 0 L     3 Minute Oxygen Saturation % 97 %     3 Minute Liters of Oxygen 0 L     4 Minute Oxygen Saturation % 98 %     4 Minute Liters of Oxygen 0 L     5 Minute Oxygen Saturation %  98 %     5 Minute Liters of Oxygen 0 L     6 Minute Oxygen Saturation % 98 %     6 Minute Liters of Oxygen 0 L     2 Minute Post Oxygen Saturation % 98 %     2 Minute Post Liters of Oxygen 0 L            Oxygen Initial Assessment:  Oxygen Initial Assessment - 08/11/20 0921      Home Oxygen   Home Oxygen Device None    Sleep Oxygen Prescription None    Home Exercise Oxygen Prescription None    Home Resting Oxygen Prescription None    Compliance with Home Oxygen Use Yes           Oxygen Re-Evaluation:  Oxygen Re-Evaluation    Row Name 08/20/20 1500 09/08/20 1510           Program Oxygen Prescription   Program Oxygen Prescription None None        Home Oxygen   Home Oxygen Device None None      Sleep Oxygen Prescription None --      Home Exercise Oxygen Prescription None None      Home Resting Oxygen Prescription None None      Compliance with Home Oxygen Use Yes Yes        Goals/Expected Outcomes   Short Term Goals To learn and exhibit compliance with exercise, home and travel O2 prescription;To learn and understand importance of monitoring SPO2 with pulse oximeter and  demonstrate accurate use of the pulse oximeter.;To learn and understand importance of maintaining oxygen saturations>88%;To learn and demonstrate proper pursed lip breathing techniques or other breathing techniques.;To learn and demonstrate proper use of respiratory medications To learn and exhibit compliance with exercise, home and travel O2 prescription;To learn and understand importance of monitoring SPO2 with pulse oximeter and demonstrate accurate use of the pulse oximeter.;To learn and understand importance of maintaining oxygen saturations>88%;To learn and demonstrate proper pursed lip breathing techniques or other breathing techniques.;To learn and demonstrate proper use of respiratory medications      Long  Term Goals Exhibits compliance with exercise, home and travel O2 prescription;Verbalizes importance of monitoring SPO2 with pulse oximeter and return demonstration;Maintenance of O2 saturations>88%;Exhibits proper breathing techniques, such as pursed lip breathing or other method taught during program session;Compliance with respiratory medication;Demonstrates proper use of MDI's Exhibits compliance with exercise, home and travel O2 prescription;Verbalizes importance of monitoring SPO2 with pulse oximeter and return demonstration;Maintenance of O2 saturations>88%;Exhibits proper breathing techniques, such as pursed lip breathing or other method taught during program session;Compliance with respiratory medication;Demonstrates proper use of MDI's      Goals/Expected Outcomes compliance compliance and understanding of oxygen saturation and pursed lip breathing             Oxygen Discharge (Final Oxygen Re-Evaluation):  Oxygen Re-Evaluation - 09/08/20 1510      Program Oxygen Prescription   Program Oxygen Prescription None      Home Oxygen   Home Oxygen Device None    Home Exercise Oxygen Prescription None    Home Resting Oxygen Prescription None    Compliance with Home Oxygen Use Yes       Goals/Expected Outcomes   Short Term Goals To learn and exhibit compliance with exercise, home and travel O2 prescription;To learn and understand importance of monitoring SPO2 with pulse oximeter and demonstrate accurate use of the pulse oximeter.;To learn and understand importance of maintaining oxygen  saturations>88%;To learn and demonstrate proper pursed lip breathing techniques or other breathing techniques.;To learn and demonstrate proper use of respiratory medications    Long  Term Goals Exhibits compliance with exercise, home and travel O2 prescription;Verbalizes importance of monitoring SPO2 with pulse oximeter and return demonstration;Maintenance of O2 saturations>88%;Exhibits proper breathing techniques, such as pursed lip breathing or other method taught during program session;Compliance with respiratory medication;Demonstrates proper use of MDI's    Goals/Expected Outcomes compliance and understanding of oxygen saturation and pursed lip breathing           Initial Exercise Prescription:  Initial Exercise Prescription - 08/11/20 1500      Date of Initial Exercise RX and Referring Provider   Date 08/11/20    Referring Provider Dr. Lamonte Sakai    Expected Discharge Date 10/08/20      NuStep   Level 2    SPM 80    Minutes 30    METs 1.6      Prescription Details   Frequency (times per week) 2    Duration Progress to 30 minutes of continuous aerobic without signs/symptoms of physical distress      Intensity   THRR 40-80% of Max Heartrate 58-116    Ratings of Perceived Exertion 11-13    Perceived Dyspnea 0-4      Progression   Progression Continue to progress workloads to maintain intensity without signs/symptoms of physical distress.      Resistance Training   Training Prescription Yes    Weight blue bands    Reps 10-15           Perform Capillary Blood Glucose checks as needed.  Exercise Prescription Changes:  Exercise Prescription Changes    Row Name  08/18/20 1200 08/27/20 1445 09/08/20 1500         Response to Exercise   Blood Pressure (Admit) 158/62 164/70 136/64     Blood Pressure (Exercise) 134/70 -- --     Blood Pressure (Exit) 130/60 152/54 140/70     Heart Rate (Admit) 86 bpm 85 bpm 80 bpm     Heart Rate (Exercise) 86 bpm 91 bpm 100 bpm     Heart Rate (Exit) 83 bpm 93 bpm 95 bpm     Oxygen Saturation (Admit) 98 % 98 % 99 %     Oxygen Saturation (Exercise) 98 % 97 % 98 %     Oxygen Saturation (Exit) 99 % 97 % 97 %     Rating of Perceived Exertion (Exercise) '11 11 13     ' Perceived Dyspnea (Exercise) '1 2 2     ' Duration Progress to 30 minutes of  aerobic without signs/symptoms of physical distress Continue with 30 min of aerobic exercise without signs/symptoms of physical distress. Continue with 30 min of aerobic exercise without signs/symptoms of physical distress.     Intensity Other (comment)  40-80% of HRR THRR unchanged THRR unchanged       Progression   Progression Continue to progress workloads to maintain intensity without signs/symptoms of physical distress. Continue to progress workloads to maintain intensity without signs/symptoms of physical distress. Continue to progress workloads to maintain intensity without signs/symptoms of physical distress.     Average METs -- -- 2.1       Resistance Training   Training Prescription No Yes Yes     Weight blue bands blue bands blue bands     Reps 10-15 10-15 10-15     Time 10 Minutes 10 Minutes --  NuStep   Level '1 3 3     ' SPM 80 80 80     Minutes '30 30 30     ' METs 1.8 2 2.1            Exercise Comments:   Exercise Goals and Review:  Exercise Goals    Row Name 08/11/20 1550 08/20/20 1502           Exercise Goals   Increase Physical Activity Yes Yes      Intervention Provide advice, education, support and counseling about physical activity/exercise needs.;Develop an individualized exercise prescription for aerobic and resistive training based on initial  evaluation findings, risk stratification, comorbidities and participant's personal goals. Provide advice, education, support and counseling about physical activity/exercise needs.;Develop an individualized exercise prescription for aerobic and resistive training based on initial evaluation findings, risk stratification, comorbidities and participant's personal goals.      Expected Outcomes Short Term: Attend rehab on a regular basis to increase amount of physical activity.;Long Term: Add in home exercise to make exercise part of routine and to increase amount of physical activity.;Long Term: Exercising regularly at least 3-5 days a week. Short Term: Attend rehab on a regular basis to increase amount of physical activity.;Long Term: Add in home exercise to make exercise part of routine and to increase amount of physical activity.;Long Term: Exercising regularly at least 3-5 days a week.      Increase Strength and Stamina Yes Yes      Intervention Provide advice, education, support and counseling about physical activity/exercise needs.;Develop an individualized exercise prescription for aerobic and resistive training based on initial evaluation findings, risk stratification, comorbidities and participant's personal goals. Provide advice, education, support and counseling about physical activity/exercise needs.;Develop an individualized exercise prescription for aerobic and resistive training based on initial evaluation findings, risk stratification, comorbidities and participant's personal goals.      Expected Outcomes Short Term: Increase workloads from initial exercise prescription for resistance, speed, and METs.;Short Term: Perform resistance training exercises routinely during rehab and add in resistance training at home;Long Term: Improve cardiorespiratory fitness, muscular endurance and strength as measured by increased METs and functional capacity (6MWT) Short Term: Increase workloads from initial exercise  prescription for resistance, speed, and METs.;Short Term: Perform resistance training exercises routinely during rehab and add in resistance training at home;Long Term: Improve cardiorespiratory fitness, muscular endurance and strength as measured by increased METs and functional capacity (6MWT)      Able to understand and use rate of perceived exertion (RPE) scale Yes Yes      Intervention Provide education and explanation on how to use RPE scale Provide education and explanation on how to use RPE scale      Expected Outcomes Short Term: Able to use RPE daily in rehab to express subjective intensity level;Long Term:  Able to use RPE to guide intensity level when exercising independently Short Term: Able to use RPE daily in rehab to express subjective intensity level;Long Term:  Able to use RPE to guide intensity level when exercising independently      Able to understand and use Dyspnea scale Yes Yes      Intervention Provide education and explanation on how to use Dyspnea scale Provide education and explanation on how to use Dyspnea scale      Expected Outcomes Short Term: Able to use Dyspnea scale daily in rehab to express subjective sense of shortness of breath during exertion;Long Term: Able to use Dyspnea scale to guide intensity level when exercising  independently Short Term: Able to use Dyspnea scale daily in rehab to express subjective sense of shortness of breath during exertion;Long Term: Able to use Dyspnea scale to guide intensity level when exercising independently      Knowledge and understanding of Target Heart Rate Range (THRR) Yes Yes      Intervention Provide education and explanation of THRR including how the numbers were predicted and where they are located for reference Provide education and explanation of THRR including how the numbers were predicted and where they are located for reference      Expected Outcomes Short Term: Able to state/look up THRR;Long Term: Able to use THRR to  govern intensity when exercising independently;Short Term: Able to use daily as guideline for intensity in rehab Short Term: Able to state/look up THRR;Long Term: Able to use THRR to govern intensity when exercising independently;Short Term: Able to use daily as guideline for intensity in rehab      Understanding of Exercise Prescription Yes Yes      Intervention Provide education, explanation, and written materials on patient's individual exercise prescription Provide education, explanation, and written materials on patient's individual exercise prescription      Expected Outcomes Short Term: Able to explain program exercise prescription;Long Term: Able to explain home exercise prescription to exercise independently Short Term: Able to explain program exercise prescription;Long Term: Able to explain home exercise prescription to exercise independently             Exercise Goals Re-Evaluation :  Exercise Goals Re-Evaluation    Row Name 08/20/20 1503 08/20/20 1510 09/08/20 1508         Exercise Goal Re-Evaluation   Exercise Goals Review Increase Physical Activity;Increase Strength and Stamina;Able to understand and use rate of perceived exertion (RPE) scale;Able to understand and use Dyspnea scale;Knowledge and understanding of Target Heart Rate Range (THRR);Able to check pulse independently;Understanding of Exercise Prescription;Improve claudication pain tolerance and improve walking ability -- Increase Physical Activity;Increase Strength and Stamina;Able to understand and use rate of perceived exertion (RPE) scale;Able to understand and use Dyspnea scale;Knowledge and understanding of Target Heart Rate Range (THRR);Understanding of Exercise Prescription     Comments -- pt has completed 2 exercise sessions with rehab. He has tolerated exercise well so far and is exercising at 1.9 METs on the Nustep. Pt has completed 6 exercise sessions and has been slow to make progressions. He is exercising at 2.1  METS on the Nustep. We will continue to monitor and progress as he is able.     Expected Outcomes Through exercise at rehab and home the patient will decrease shortness of breath with daily activities and feel confident in carrying out an exercise regimn at home -- Through exercise at rehab and home the patient will decrease shortness of breath with daily activities and feel confident in carrying out an exercise regimn at home            Discharge Exercise Prescription (Final Exercise Prescription Changes):  Exercise Prescription Changes - 09/08/20 1500      Response to Exercise   Blood Pressure (Admit) 136/64    Blood Pressure (Exit) 140/70    Heart Rate (Admit) 80 bpm    Heart Rate (Exercise) 100 bpm    Heart Rate (Exit) 95 bpm    Oxygen Saturation (Admit) 99 %    Oxygen Saturation (Exercise) 98 %    Oxygen Saturation (Exit) 97 %    Rating of Perceived Exertion (Exercise) 13    Perceived Dyspnea (  Exercise) 2    Duration Continue with 30 min of aerobic exercise without signs/symptoms of physical distress.    Intensity THRR unchanged      Progression   Progression Continue to progress workloads to maintain intensity without signs/symptoms of physical distress.    Average METs 2.1      Resistance Training   Training Prescription Yes    Weight blue bands    Reps 10-15      NuStep   Level 3    SPM 80    Minutes 30    METs 2.1           Nutrition:  Target Goals: Understanding of nutrition guidelines, daily intake of sodium <1576m, cholesterol <2086m calories 30% from fat and 7% or less from saturated fats, daily to have 5 or more servings of fruits and vegetables.  Biometrics:  Pre Biometrics - 08/11/20 1510      Pre Biometrics   Grip Strength 41 kg            Nutrition Therapy Plan and Nutrition Goals:  Nutrition Therapy & Goals - 09/08/20 1323      Nutrition Therapy   Diet Carb modified      Personal Nutrition Goals   Personal Goal #2 CBG concentrations  in the normal range or as close to normal as is safely possible.      Intervention Plan   Intervention Prescribe, educate and counsel regarding individualized specific dietary modifications aiming towards targeted core components such as weight, hypertension, lipid management, diabetes, heart failure and other comorbidities.    Expected Outcomes Short Term Goal: A plan has been developed with personal nutrition goals set during dietitian appointment.;Long Term Goal: Adherence to prescribed nutrition plan.           Nutrition Assessments:   Nutrition Goals Re-Evaluation:  Nutrition Goals Re-Evaluation    Row Name 09/08/20 1323 09/08/20 1324 09/09/20 0827         Goals   Current Weight 233 lb (105.7 kg) 233 lb (105.7 kg) 235 lb 7.2 oz (106.8 kg)     Nutrition Goal Improved blood glucose control as evidenced by pt's A1c trending from 8.2 toward less than 7.0. Improved blood glucose control as evidenced by pt's A1c trending from 8.2 toward less than 7.0. Improved blood glucose control as evidenced by pt's A1c trending from 8.2 toward less than 7.0.     Comment -- -- Reviewed hypoglycemia protocol/rule of 15's       Personal Goal #2 Re-Evaluation   Personal Goal #2 -- -- CBG concentrations in the normal range or as close to normal as is safely possible.            Nutrition Goals Discharge (Final Nutrition Goals Re-Evaluation):  Nutrition Goals Re-Evaluation - 09/09/20 0827      Goals   Current Weight 235 lb 7.2 oz (106.8 kg)    Nutrition Goal Improved blood glucose control as evidenced by pt's A1c trending from 8.2 toward less than 7.0.    Comment Reviewed hypoglycemia protocol/rule of 15's      Personal Goal #2 Re-Evaluation   Personal Goal #2 CBG concentrations in the normal range or as close to normal as is safely possible.           Psychosocial: Target Goals: Acknowledge presence or absence of significant depression and/or stress, maximize coping skills, provide  positive support system. Participant is able to verbalize types and ability to use techniques and skills needed for reducing  stress and depression.  Initial Review & Psychosocial Screening:  Initial Psych Review & Screening - 08/11/20 0923      Initial Review   Current issues with Current Depression      Family Dynamics   Good Support System? Yes    Concerns Recent loss of significant other      Barriers   Psychosocial barriers to participate in program The patient should benefit from training in stress management and relaxation.      Screening Interventions   Interventions Encouraged to exercise    Expected Outcomes Long Term Goal: Stressors or current issues are controlled or eliminated.;Short Term goal: Identification and review with participant of any Quality of Life or Depression concerns found by scoring the questionnaire.;Long Term goal: The participant improves quality of Life and PHQ9 Scores as seen by post scores and/or verbalization of changes           Quality of Life Scores:  Scores of 19 and below usually indicate a poorer quality of life in these areas.  A difference of  2-3 points is a clinically meaningful difference.  A difference of 2-3 points in the total score of the Quality of Life Index has been associated with significant improvement in overall quality of life, self-image, physical symptoms, and general health in studies assessing change in quality of life.  PHQ-9: Recent Review Flowsheet Data    Depression screen [facility] 2/9 08/25/2020 08/11/2020 09/17/2018   Decreased Interest 0 3  0   Down, Depressed, Hopeless 0 2 0   PHQ - 2 Score 0 5 0   Altered sleeping - 0 -   Tired, decreased energy - 3 -   Change in appetite - 3 -   Feeling bad or failure about yourself  - 0 -   Trouble concentrating - 0 -   Moving slowly or fidgety/restless - 0 -   Suicidal thoughts - 0 -   PHQ-9 Score - 11 -   Difficult doing work/chores - Not difficult at all -      Interpretation of Total Score  Total Score Depression Severity:  1-4 = Minimal depression, 5-9 = Mild depression, 10-14 = Moderate depression, 15-19 = Moderately severe depression, 20-27 = Severe depression   Psychosocial Evaluation and Intervention:  Psychosocial Evaluation - 09/07/20 1451      Psychosocial Evaluation & Interventions   Interventions Stress management education;Relaxation education;Encouraged to exercise with the program and follow exercise prescription    Comments Pt at times is difficult to assess his psychosocial needs.  Pt rambles with different topics during a conversation difficult to discern what is presently occuring and what has happened in the past and is no longer a concern.  Pt feesl supported by his daughter and granddaughter.    Expected Outcomes Pt will employ pt  preferred non medication tools to assist in managment of his depression such as increased physical activity    Continue Psychosocial Services  Follow up required by staff           Psychosocial Re-Evaluation:  Psychosocial Re-Evaluation    [place] Name 08/19/20 1510 08/19/20 1511 08/19/20 1633 09/07/20 1537       Psychosocial Re-Evaluation   Current issues with Current Depression Current Depression -- Current Depression    Comments Pt has completed 1 exercise session. Will continue to engage with pt to establish rapport. Pt has completed 1 exercise session. Will continue to engage with pt to establish rapport. Trentan  has completed 1 exercise session.  Will continue to engage with pt to establish rapport. Diane  has completed 5 exercise sessions.  Pt has anti-depressant ordered but he does not want to take.  Pt is looking at non pharmacologic agents for his depression    Expected Outcomes Pt will demonstrate positve and healthy coping skills to manage his current depression Pt will demonstrate positve and healthy coping skills to manage his current depression with emphasis on increasing physical  activity Aleric will demonstrate positve and healthy coping skills to manage his current depression with emphasis on increasing physical activity Irvan will be able to manage his current depression with natural and herbal remedies.    Interventions Stress management education;Relaxation education;Encouraged to attend Pulmonary Rehabilitation for the exercise Stress management education;Relaxation education;Encouraged to attend Pulmonary Rehabilitation for the exercise -- Stress management education;Relaxation education;Encouraged to attend Pulmonary Rehabilitation for the exercise    Continue Psychosocial Services  -- Follow up required by staff -- Follow up required by staff           Psychosocial Discharge (Final Psychosocial Re-Evaluation):  Psychosocial Re-Evaluation - 09/07/20 1537      Psychosocial Re-Evaluation   Current issues with Current Depression    Comments York  has completed 5 exercise sessions.  Pt has anti-depressant ordered but he does not want to take.  Pt is looking at non pharmacologic agents for his depression    Expected Outcomes Rajinder will be able to manage his current depression with natural and herbal remedies.    Interventions Stress management education;Relaxation education;Encouraged to attend Pulmonary Rehabilitation for the exercise    Continue Psychosocial Services  Follow up required by staff           Education: Education Goals: Education classes will be provided on a weekly basis, covering required topics. Participant will state understanding/return demonstration of topics presented.  Learning Barriers/Preferences:  Learning Barriers/Preferences - 08/11/20 0925      Learning Barriers/Preferences   Learning Barriers Sight;Hearing    Learning Preferences Written Material;Computer/Internet           Education Topics: Risk Factor Reduction:  -Group instruction that is supported by a PowerPoint presentation. Instructor discusses the  definition of a risk factor, different risk factors for pulmonary disease, and how the heart and lungs work together.     Nutrition for Pulmonary Patient:  -Group instruction provided by PowerPoint slides, verbal discussion, and written materials to support subject matter. The instructor gives an explanation and review of healthy diet recommendations, which includes a discussion on weight management, recommendations for fruit and vegetable consumption, as well as protein, fluid, caffeine, fiber, sodium, sugar, and alcohol. Tips for eating when patients are short of breath are discussed.   Pursed Lip Breathing:  -Group instruction that is supported by demonstration and informational handouts. Instructor discusses the benefits of pursed lip and diaphragmatic breathing and detailed demonstration on how to preform both.     Oxygen Safety:  -Group instruction provided by PowerPoint, verbal discussion, and written material to support subject matter. There is an overview of "What is Oxygen" and "Why do we need it".  Instructor also reviews how to create a safe environment for oxygen use, the importance of using oxygen as prescribed, and the risks of noncompliance. There is a brief discussion on traveling with oxygen and resources the patient may utilize.   Oxygen Equipment:  -Group instruction provided by [facility] Staff utilizing handouts, written materials, and equipment demonstrations.   Signs and Symptoms:  -Group instruction provided by written  material and verbal discussion to support subject matter. Warning signs and symptoms of infection, stroke, and heart attack are reviewed and when to call the physician/911 reinforced. Tips for preventing the spread of infection discussed.   Advanced Directives:  -Group instruction provided by verbal instruction and written material to support subject matter. Instructor reviews Advanced Directive laws and proper instruction for filling out  document.   Pulmonary Video:  -Group video education that reviews the importance of medication and oxygen compliance, exercise, good nutrition, pulmonary hygiene, and pursed lip and diaphragmatic breathing for the pulmonary patient.   Exercise for the Pulmonary Patient:  -Group instruction that is supported by a PowerPoint presentation. Instructor discusses benefits of exercise, core components of exercise, frequency, duration, and intensity of an exercise routine, importance of utilizing pulse oximetry during exercise, safety while exercising, and options of places to exercise outside of rehab.     Pulmonary Medications:  -Verbally interactive group education provided by instructor with focus on inhaled medications and proper administration.   Anatomy and Physiology of the Respiratory System and Intimacy:  -Group instruction provided by PowerPoint, verbal discussion, and written material to support subject matter. Instructor reviews respiratory cycle and anatomical components of the respiratory system and their functions. Instructor also reviews differences in obstructive and restrictive respiratory diseases with examples of each. Intimacy, Sex, and Sexuality differences are reviewed with a discussion on how relationships can change when diagnosed with pulmonary disease. Common sexual concerns are reviewed.   MD DAY -A group question and answer session with a medical doctor that allows participants to ask questions that relate to their pulmonary disease state.   OTHER EDUCATION -Group or individual verbal, written, or video instructions that support the educational goals of the pulmonary rehab program.   PULMONARY REHAB OTHER RESPIRATORY from 08/18/2020 in [place]  Date 08/18/20  Educator Handout  [METS]      Holiday Eating Survival Tips:  -Group instruction provided by PowerPoint slides, verbal discussion, and written materials to support subject  matter. The instructor gives patients tips, tricks, and techniques to help them not only survive but enjoy the holidays despite the onslaught of food that accompanies the holidays.   Knowledge Questionnaire Score:  Knowledge Questionnaire Score - 08/11/20 1156      Knowledge Questionnaire Score   Pre Score 13/18           Core Components/Risk Factors/Patient Goals at Admission:  Personal Goals and Risk Factors at Admission - 08/11/20 0926      Core Components/Risk Factors/Patient Goals on Admission    Weight Management Weight Loss    Goal Weight: Long Term 200 lb (90.7 kg)    Improve shortness of breath with ADL's Yes    Intervention Provide education, individualized exercise plan and daily activity instruction to help decrease symptoms of SOB with activities of daily living.    Expected Outcomes Short Term: Improve cardiorespiratory fitness to achieve a reduction of symptoms when performing ADLs;Long Term: Be able to perform more ADLs without symptoms or delay the onset of symptoms    Diabetes Yes    Intervention Provide education about signs/symptoms and action to take for hypo/hyperglycemia.;Provide education about proper nutrition, including hydration, and aerobic/resistive exercise prescription along with prescribed medications to achieve blood glucose in normal ranges: Fasting glucose 65-99 mg/dL    Expected Outcomes Short Term: Participant verbalizes understanding of the signs/symptoms and immediate care of hyper/hypoglycemia, proper foot care and importance of medication, aerobic/resistive exercise and nutrition plan  for blood glucose control.;Long Term: Attainment of HbA1C < 7%.    Stress Yes    Intervention Refer participants experiencing significant psychosocial distress to appropriate mental health specialists for further evaluation and treatment. When possible, include family members and significant others in education/counseling sessions.;Offer individual and/or small group  education and counseling on adjustment to heart disease, stress management and health-related lifestyle change. Teach and support self-help strategies.    Expected Outcomes Short Term: Participant demonstrates changes in health-related behavior, relaxation and other stress management skills, ability to obtain effective social support, and compliance with psychotropic medications if prescribed.;Long Term: Emotional wellbeing is indicated by absence of clinically significant psychosocial distress or social isolation.           Core Components/Risk Factors/Patient Goals Review:   Goals and Risk Factor Review    [place] Name 08/19/20 1513 08/19/20 1634 09/07/20 1455         Core Components/Risk Factors/Patient Goals Review   Personal Goals Review Weight Management/Obesity;Improve shortness of breath with ADL's;Develop more efficient breathing techniques such as purse lipped breathing and diaphragmatic breathing and practicing self-pacing with activity.;Increase knowledge of respiratory medications and ability to use respiratory devices properly.;Stress;Diabetes -- Weight Management/Obesity;Improve shortness of breath with ADL's;Develop more efficient breathing techniques such as purse lipped breathing and diaphragmatic breathing and practicing self-pacing with activity.;Increase knowledge of respiratory medications and ability to use respiratory devices properly.;Stress;Diabetes     Review Pt has completed 1 exercise session.  Will continue to monitor pt ability and technique wit PLB. Barrier to this is chronic sinusitis and congestion make breathing through his nose difficult.  Will need to concentrate on moving pt toward independent diaphragmatic breathing. Will have Ailene Ravel RD to establish plan for pt desired weight loss and diabetes management.  Pt takes both oral agents and high dose insulin with labile CBG readings  as a result of not eating breakfast consistently. Cheng has completed 1 exercise  session.  Will continue to monitor pt ability and technique wit PLB. Barrier to this is chronic sinusitis and congestion make breathing through his nose difficult.  Will need to concentrate on moving pt toward independent diaphragmatic breathing. Will have Ailene Ravel RD to establish plan for pt desired weight loss and diabetes management.  Pt takes both oral agents and high dose insulin with labile CBG readings  as a result of not eating breakfast consistently. Dam has completed 5 exercise sessions.  Will continue to monitor pt ability and technique with PLB.  Pt seen by primary MD for sinsusitis, to use Muccinex which pt feels has helped.  Will need to concentrate on moving pt toward independent diaphragmatic breathing.  Pt takes both oral agents and high dose insulin with labile CBG readings  as a result of not eating breakfast consistently. Pt seen by his endocrinlologist at the [place] no changes made.  Acknowledged that his blood sugar readings are labile. Weight elevated 2 kg from begining weight.  Pt does not engage in consistent home exercise.     Expected Outcomes See Admission Goals -- See Admission Goals            Core Components/Risk Factors/Patient Goals at Discharge (Final Review):   Goals and Risk Factor Review - 09/07/20 1455      Core Components/Risk Factors/Patient Goals Review   Personal Goals Review Weight Management/Obesity;Improve shortness of breath with ADL's;Develop more efficient breathing techniques such as purse lipped breathing and diaphragmatic breathing and practicing self-pacing with activity.;Increase knowledge of respiratory medications and ability to use  respiratory devices properly.;Stress;Diabetes    Review Arsenio has completed 5 exercise sessions.  Will continue to monitor pt ability and technique with PLB.  Pt seen by primary MD for sinsusitis, to use Muccinex which pt feels has helped.  Will need to concentrate on moving pt toward independent diaphragmatic  breathing.  Pt takes both oral agents and high dose insulin with labile CBG readings  as a result of not eating breakfast consistently. Pt seen by his endocrinlologist at the [place] no changes made.  Acknowledged that his blood sugar readings are labile. Weight elevated 2 kg from begining weight.  Pt does not engage in consistent home exercise.    Expected Outcomes See Admission Goals           ITP Comments:   Comments:  Erol has completed 6 exercise session in Pulmonary rehab. Pt maintains good attendance and consistent home exercise. Pulmonary rehab staff will  continue to monitor and reassess progress toward goals during her participation in Pulmonary Rehab. Cherre Huger, BSN Cardiac and Training and development officer

## 2020-09-15 ENCOUNTER — Encounter (HOSPITAL_COMMUNITY)
Admission: RE | Admit: 2020-09-15 | Discharge: 2020-09-15 | Disposition: A | Discharge status: Home / Self Care | Payer: Medicare HMO | Source: Ambulatory Visit | Attending: Emergency Medicine | Admitting: Emergency Medicine

## 2020-09-15 ENCOUNTER — Other Ambulatory Visit: Payer: Self-pay

## 2020-09-15 VITALS — Wt 231.3 lb

## 2020-09-15 DIAGNOSIS — J449 Chronic obstructive pulmonary disease, unspecified: Secondary | ICD-10-CM | POA: Insufficient documentation

## 2020-09-15 NOTE — Progress Notes (Signed)
Sean Saunders came in for exercise to Pulmonary Rehab today. States that he has congestion and a cough that started Saturday. He started Mucinex then.  He  wanted to exercise. As he exercised he stated that he is tired of this congestion. I encouraged him to call Dr. Agustina Caroli office to report his symptoms. He stated that he was going to call. He was discharged in stable condition. BP 144/60 HR 94 room air saturation 97%.

## 2020-09-15 NOTE — Progress Notes (Signed)
Daily Session Note  Patient Details  Name: Sean Saunders MRN: [id number] Date of Birth: Apr 17, 1946 Referring Provider:     Pulmonary Rehab Walk Test from 08/11/2020 in [place]  Referring Provider Dr. Lamonte Sakai      Encounter Date: 09/15/2020  Check In:  Session Check In - 09/15/20 1120      Check-In   Supervising physician immediately available to respond to emergencies [facility] immediately available    Physician(s) Dr. Darliss Cheney    Location [facility]    Staff Present Maurice Small, RN, Bjorn Loser, MS, CEP, Exercise Physiologist;Junelle Hashemi Jani Gravel, MS, ACSM-CEP, Exercise Physiologist    Virtual Visit No    Medication changes reported     No    Fall or balance concerns reported    No    Tobacco Cessation No Change    Warm-up and Cool-down Performed on first and last piece of equipment    Resistance Training Performed Yes    VAD Patient? No    PAD/SET Patient? No      Pain Assessment   Currently in Pain? No/denies    Multiple Pain Sites No           Capillary Blood Glucose: No results found for this or any previous visit (from the past 24 hour(s)).    Social History   Tobacco Use  Smoking Status Former Smoker  . Packs/day: 1.00  . Years: 25.00  . Pack years: 25.00  . Types: Cigarettes  . Quit date: 06/05/2018  . Years since quitting: 2.2  Smokeless Tobacco Never Used    Goals Met:  No report of cardiac concerns or symptoms Strength training completed today  Goals Unmet:  Not Applicable  Comments: Service time is from 1030 to 1135 See note.   Dr. Fransico Him is Medical Director for Cardiac Rehab at [facility].

## 2020-09-16 ENCOUNTER — Ambulatory Visit (INDEPENDENT_AMBULATORY_CARE_PROVIDER_SITE_OTHER): Payer: Medicare HMO

## 2020-09-16 ENCOUNTER — Telehealth: Payer: Self-pay | Admitting: Emergency Medicine

## 2020-09-16 ENCOUNTER — Encounter: Payer: Self-pay | Admitting: Primary Care

## 2020-09-16 ENCOUNTER — Ambulatory Visit (INDEPENDENT_AMBULATORY_CARE_PROVIDER_SITE_OTHER): Payer: Medicare HMO | Admitting: Primary Care

## 2020-09-16 ENCOUNTER — Telehealth (HOSPITAL_COMMUNITY): Payer: Self-pay | Admitting: Family Medicine

## 2020-09-16 VITALS — BP 130/78 | HR 84 | Temp 97.4°F | Ht 72.0 in | Wt 230.0 lb

## 2020-09-16 DIAGNOSIS — J449 Chronic obstructive pulmonary disease, unspecified: Secondary | ICD-10-CM | POA: Diagnosis not present

## 2020-09-16 DIAGNOSIS — J441 Chronic obstructive pulmonary disease with (acute) exacerbation: Secondary | ICD-10-CM

## 2020-09-16 DIAGNOSIS — R059 Cough, unspecified: Secondary | ICD-10-CM | POA: Diagnosis not present

## 2020-09-16 MED ORDER — PREDNISONE 10 MG PO TABS: ORAL_TABLET | ORAL | 0 refills | Status: DC

## 2020-09-16 MED ORDER — SODIUM CHLORIDE 3 % IN NEBU
INHALATION_SOLUTION | Freq: Two times a day (BID) | RESPIRATORY_TRACT | 0 refills | Status: DC
Start: 2020-09-16 — End: 2021-01-27

## 2020-09-16 MED ORDER — LEVOFLOXACIN 500 MG PO TABS: 500.0000 mg | ORAL_TABLET | Freq: Every day | ORAL | 0 refills | Status: DC

## 2020-09-16 MED ORDER — METHYLPREDNISOLONE ACETATE 80 MG/ML IJ SUSP
80.0000 mg | Freq: Once | INTRAMUSCULAR | Status: AC
Start: 2020-09-16 — End: 2020-09-16
  Administered 2020-09-16: 80 mg via INTRAMUSCULAR

## 2020-09-16 NOTE — Telephone Encounter (Signed)
Spoke with the pt and have scheduled to see Sean Saunders today at 2:30 pm

## 2020-09-16 NOTE — Progress Notes (Signed)
@Patient  ID: Sean Saunders, male    DOB: 05-18-1946, 74 y.o.   MRN: [id number]  Chief Complaint  Patient presents with   Acute Visit    Pt has had complaints increased SOB with exertion and also has had a lot of wheezing. Pt said they have told him that he has a "gurling sound" and wonders if it is fluid in lungs. Pt said his O2 sats dropped to 74% after doing activities at home but did go back up to 90s after about 15 minutes.    Referring provider: Martinique, Betty G, MD  HPI: 74 year old male, former smoker quit 2019 (25 pack year hx). PMH significant for COPD, respiratory failure, allergic rhinitis, HTN, PVD, cholelithiasis, type 2 diabetes, CKD stage 3. Patient of Dr. Lamonte Sakai, last seen in September 2021. Advair changed to Trelegy.  09/16/2020 Patient presents today for acute visit/cough and decreased O2 saturation on exertion. He has a congested cough which worsened from his baseline 1 day ago. He is not able to produce much mucus, sputum is clear. Reports that his oxygen level dropped at home into 70s following coughing fit from talking on the phone and cleaning his house. He recovered after 10-15 mins. He has had no further oxygen desaturation events. Associated nasal congestion and shortness of breath. He is using trelegy 100 and taking mucinex 1,200mg  twice daily. He uses flutter valve after nebulizer. During his last office visit, it was noted that he had a difficult times managing his secretions with sounds like pooling mucus/saliva. His BS today at home was 114.   Allergies  Allergen Reactions   Eszopiclone Itching   Niaspan [Niacin Er] Nausea And Vomiting and Rash    Immunization History  Administered Date(s) Administered   Fluad Quad(high Dose 65+) 08/13/2019, 08/31/2020   Influenza, High Dose Seasonal PF 09/25/2017, 09/17/2018   Influenza-Unspecified 11/11/2013   PFIZER SARS-COV-2 Vaccination 02/13/2020, 03/11/2020   Tdap 12/11/2018    Past Medical History:   Diagnosis Date   Arthritis    COPD (chronic obstructive pulmonary disease) ([place])    Diabetes mellitus    DVT (deep venous thrombosis) (HCC)    GERD (gastroesophageal reflux disease)    Hyperlipidemia    Hypertension    Hypothyroidism    Onychomycosis 06/07/2013   Stroke ([place])    TIA's 01/05/11-01/08/11-01/26/11   TIA (transient ischemic attack) 08/03/2011   Left sided weakness. 3 episodes Feb-March    Tobacco History: Social History   Tobacco Use  Smoking Status Former Smoker   Packs/day: 1.00   Years: 25.00   Pack years: 25.00   Types: Cigarettes   Quit date: 06/05/2018   Years since quitting: 2.2  Smokeless Tobacco Never Used   Counseling given: Not Answered   Outpatient Medications Prior to Visit  Medication Sig Dispense Refill   allopurinol (ZYLOPRIM) 100 MG tablet Take 100 mg by mouth 2 (two) times daily.     amLODipine (NORVASC) 10 MG tablet Take 10 mg by mouth daily.     cetirizine (ZYRTEC ALLERGY) 10 MG tablet Take 1 tablet (10 mg total) by mouth daily. 30 tablet 5   clopidogrel (PLAVIX) 75 MG tablet Take 75 mg by mouth daily.       COMBIVENT RESPIMAT 20-100 MCG/ACT AERS respimat Inhale 2 puffs into the lungs 3 (three) times daily.      fluticasone (FLONASE) 50 MCG/ACT nasal spray Place 2 sprays into both nostrils daily. 16 g 5   Fluticasone-Umeclidin-Vilant (TRELEGY ELLIPTA) 100-62.5-25 MCG/INH AEPB Inhale  1 puff into the lungs daily. 60 each 2   furosemide (LASIX) 20 MG tablet Take 20 mg by mouth every other day.      hydrALAZINE (APRESOLINE) 50 MG tablet Take 50 mg by mouth every morning.     hydrochlorothiazide (HYDRODIURIL) 25 MG tablet Take 25 mg by mouth daily.     insulin regular human CONCENTRATED (HUMULIN R) 500 UNIT/ML injection Inject 110-140 Units into the skin See admin instructions. 110 units at breakfast, 140 units at lunch, 110 units at dinner     irbesartan (AVAPRO) 150 MG tablet Take 1 tablet (150 mg total) by mouth  daily. 30 tablet 3   levothyroxine (SYNTHROID, LEVOTHROID) 150 MCG tablet Take 150 mcg by mouth daily before breakfast.     lisinopril (ZESTRIL) 40 MG tablet Take 40 mg by mouth daily.     metFORMIN (GLUCOPHAGE) 500 MG tablet Take 500 mg by mouth 2 (two) times daily with a meal.     Multiple Vitamin (MULTIVITAMIN WITH MINERALS) TABS tablet Take 1 tablet by mouth daily.     Pseudoephedrine-guaiFENesin (MUCINEX D MAX STRENGTH PO) Take 2 tablets by mouth every 4 (four) hours.     rOPINIRole (REQUIP) 0.25 MG tablet Take 0.25 mg by mouth at bedtime.     rosuvastatin (CRESTOR) 40 MG tablet Take 20 mg by mouth daily.      sertraline (ZOLOFT) 25 MG tablet Take 1 tablet (25 mg total) by mouth daily. 30 tablet 0   tamsulosin (FLOMAX) 0.4 MG CAPS capsule Take 0.4 mg by mouth.     irbesartan (AVAPRO) 150 MG tablet Take 1 tablet (150 mg total) by mouth daily. 1 tablet 3   No facility-administered medications prior to visit.   Review of Systems  Review of Systems  Constitutional: Negative for chills and fever.  HENT: Positive for congestion and postnasal drip.   Respiratory: Positive for cough, shortness of breath and wheezing.   Cardiovascular: Negative.    Physical Exam  BP 130/78 (BP Location: Right Arm, Cuff Size: Normal)    Pulse 84    Temp (!) 97.4 F (36.3 C) (Other (Comment)) Comment (Src): wrist   Ht 6' (1.829 m)    Wt 230 lb (104.3 kg)    SpO2 98%    BMI 31.19 kg/m  Physical Exam Constitutional:      Appearance: Normal appearance.  HENT:     Mouth/Throat:     Comments: Deferred d/t masking Cardiovascular:     Rate and Rhythm: Normal rate and regular rhythm.     Comments: No edema Pulmonary:     Comments: LS with scattered rhonchi and wheezing. Extremly congested cough. O2 98% RA Musculoskeletal:        General: Normal range of motion.  Neurological:     General: No focal deficit present.     Mental Status: He is alert and oriented to person, place, and time. Mental  status is at baseline.  Psychiatric:        Mood and Affect: Mood normal.        Behavior: Behavior normal.        Thought Content: Thought content normal.        Judgment: Judgment normal.      Lab Results:  CBC    Component Value Date/Time   WBC 9.8 07/01/2020 0433   RBC 3.71 (L) 07/01/2020 0433   HGB 10.4 (L) 07/01/2020 0433   HCT 33.3 (L) 07/01/2020 0433   PLT 232 07/01/2020 0433  MCV 89.8 07/01/2020 0433   MCH 28.0 07/01/2020 0433   MCHC 31.2 07/01/2020 0433   RDW 14.5 07/01/2020 0433   LYMPHSABS 1.8 07/01/2020 0433   MONOABS 0.5 07/01/2020 0433   EOSABS 0.0 07/01/2020 0433   BASOSABS 0.0 07/01/2020 0433    BMET    Component Value Date/Time   NA 136 07/01/2020 0433   K 5.0 07/01/2020 0433   CL 103 07/01/2020 0433   CO2 25 07/01/2020 0433   GLUCOSE 276 (H) 07/01/2020 0433   BUN 26 (H) 07/01/2020 0433   CREATININE 1.14 07/01/2020 0433   CREATININE 1.26 07/31/2013 1018   CALCIUM 8.4 (L) 07/01/2020 0433   GFRNONAA >60 07/01/2020 0433   GFRAA >60 07/01/2020 0433    BNP    Component Value Date/Time   BNP 98.3 06/27/2020 0509    ProBNP    Component Value Date/Time   PROBNP <30.0 01/24/2008 1537    Imaging: DG Chest 2 View  Result Date: 09/16/2020 CLINICAL DATA:  Cough and decreased oxygen saturation EXAM: CHEST - 2 VIEW COMPARISON:  June 29, 2020 chest radiograph; chest CT June 27, 2020 FINDINGS: The lungs are clear. Heart size and pulmonary vascularity are normal. No adenopathy. There is aortic atherosclerosis. No bone lesions. IMPRESSION: Lungs clear.  Cardiac silhouette normal. Aortic Atherosclerosis (ICD10-I70.0). Electronically Signed   By: Lowella Grip III M.D.   On: 09/16/2020 14:26     Assessment & Plan:   COPD (chronic obstructive pulmonary disease) (HCC) - Acute exacerbation x 1-2 days. Patient has an extremely congested cough with difficulty producing mucus. He is compliant with Trelegy 100, mucinex and flutter valve. He had scattered  rhonchi and wheezing t/o lung fields on exam. O2 was 98% on room air. Received 80mg  depo-medrol IM injection today in office. We have sent in a prescription for Levaquin 500mg  once daily x 7 days, prednisone taper and are starting him on hypertonic saline nebulizers twice daily. He will follow-up in 1 week in office to re-assess. Advised patient present to ED if symptoms worsen in any way or O2 remains <88%.   Plan: - Continue Trelegy 100 one puff daily  - Flutter three times a day - Mucinex 1,200mg  twice daily (do not take more than 2,400mg ) - Start Hypertonic saline nebulizers twice daily   Officer treatment: - Depo-medrol 80mg  IM x1 today  Rx: - Levaquin 500mg  daily x 7 days - Prednisone taper (50mg  x 2 days; 40mg  x 2 days; 30mg  x 2 days; 20mg  x 2 days; 10mg  x 2 days)  Follow-up: - 5-7 days with Dr. Lamonte Sakai or Maryclare Bean, NP 09/16/2020

## 2020-09-16 NOTE — Patient Instructions (Addendum)
Recommendations: - Continue Trelegy 100 one puff daily  - Flutter three times a day - Mucinex 1,200mg  twice daily (do not take more than 2,400mg ) - Continue LASIX 20mg  every other day   Officer treatment: - Depo-medrol 80mg  IM x1 today  Rx: - Start hypertonic saline nebulizer twice daily  - Levaquin 1 tablet daily x 7 days - Prednisone taper as prescribed   Follow-up: - 5-7 days with Dr. Lamonte Sakai or Eustaquio Maize

## 2020-09-16 NOTE — Telephone Encounter (Signed)
Agree he needs to be seen since he is desaturating. I believe in absence of f/c, myalgias, it should be ok for him to be seen without COVID testing.

## 2020-09-16 NOTE — Telephone Encounter (Signed)
Spoke with the pt  He is c/o increased SOB and cough with light yellow sputum for the past 3 days  He states that he is checking his sats with exertion and they have been as low as 83% He is not on o2  No f/c/s, body aches, he has been fully vaccinated  Requesting in person ov  Please advise, thanks!

## 2020-09-16 NOTE — Assessment & Plan Note (Addendum)
-   Acute exacerbation x 1-2 days. Patient has an extremely congested cough with difficulty producing mucus. He is compliant with Trelegy 100, mucinex and flutter valve. He had scattered rhonchi and wheezing t/o lung fields on exam. O2 was 98% on room air. CXR today showed clear lungs. Received 80mg  depo-medrol IM injection today in office. We have sent in a prescription for Levaquin 500mg  once daily x 7 days, prednisone taper and are starting him on hypertonic saline nebulizers twice daily. He will follow-up in 1 week in office to re-assess. Advised patient present to ED if symptoms worsen in any way or O2 remains <88%.   Plan: - Continue Trelegy 100 one puff daily  - Flutter three times a day - Mucinex 1,200mg  twice daily (do not take more than 2,400mg ) - Start Hypertonic saline nebulizers twice daily  Officer treatment: - Depo-medrol 80mg  IM x1 today  Rx: - Levaquin 500mg  daily x 7 days - Prednisone taper (50mg  x 2 days; 40mg  x 2 days; 30mg  x 2 days; 20mg  x 2 days; 10mg  x 2 days)  Follow-up: - 5-7 days with Dr. Lamonte Sakai or Eustaquio Maize

## 2020-09-17 ENCOUNTER — Encounter (HOSPITAL_COMMUNITY): Admission: RE | Admit: 2020-09-17 | Payer: Medicare HMO | Source: Ambulatory Visit

## 2020-09-21 NOTE — Progress Notes (Deleted)
@Patient  ID: Sean Saunders, male    DOB: 05-31-1946, 74 y.o.   MRN: [id number]  No chief complaint on file.   Referring provider: Martin[place], Betty G, MD  HPI: 74 year old male, former smoker quit 2019 (25 pack year hx). PMH significant for COPD, respiratory failure, allergic rhinitis, HTN, PVD, cholelithiasis, type 2 diabetes, CKD stage 3. Patient of Dr. Lamonte Sakai, last seen in September 2021. Advair changed to Trelegy.  09/16/2020 Patient presents today for acute visit/cough and decreased O2 saturation on exertion. He has a congested cough which worsened from his baseline 1 day ago. He is not able to produce much mucus, sputum is clear. Reports that his oxygen level dropped at home into 70s following coughing fit from talking on the phone and cleaning his house. He recovered after 10-15 mins. He has had no further oxygen desaturation events. Associated nasal congestion and shortness of breath. He is using trelegy 100 and taking mucinex 1,200mg  twice daily. He uses flutter valve after nebulizer. During his last office visit, it was noted that he had a difficult times managing his secretions with sounds like pooling mucus/saliva. His BS today at home was 114.    Allergies  Allergen Reactions  . Eszopiclone Itching  . Niaspan [Niacin Er] Nausea And Vomiting and Rash    Immunization History  Administered Date(s) Administered  . Fluad Quad(high Dose 65+) 08/13/2019, 08/31/2020  . Influenza, High Dose Seasonal PF 09/25/2017, 09/17/2018  . Influenza-Unspecified 11/11/2013  . PFIZER SARS-COV-2 Vaccination 02/13/2020, 03/11/2020  . Tdap 12/11/2018    Past Medical History:  Diagnosis Date  . Arthritis   . COPD (chronic obstructive pulmonary disease) ([place])   . Diabetes mellitus   . DVT (deep venous thrombosis) ([place])   . GERD (gastroesophageal reflux disease)   . Hyperlipidemia   . Hypertension   . Hypothyroidism   . Onychomycosis 06/07/2013  . Stroke [facility])    TIA's 01/05/11-01/08/11-01/26/11    . TIA (transient ischemic attack) 08/03/2011   Left sided weakness. 3 episodes Feb-March    Tobacco History: Social History   Tobacco Use  Smoking Status Former Smoker  . Packs/day: 1.00  . Years: 25.00  . Pack years: 25.00  . Types: Cigarettes  . Quit date: 06/05/2018  . Years since quitting: 2.2  Smokeless Tobacco Never Used   Counseling given: Not Answered   Outpatient Medications Prior to Visit  Medication Sig Dispense Refill  . allopurinol (ZYLOPRIM) 100 MG tablet Take 100 mg by mouth 2 (two) times daily.    Marland Kitchen amLODipine (NORVASC) 10 MG tablet Take 10 mg by mouth daily.    . cetirizine (ZYRTEC ALLERGY) 10 MG tablet Take 1 tablet (10 mg total) by mouth daily. 30 tablet 5  . clopidogrel (PLAVIX) 75 MG tablet Take 75 mg by mouth daily.      . COMBIVENT RESPIMAT 20-100 MCG/ACT AERS respimat Inhale 2 puffs into the lungs 3 (three) times daily.     . fluticasone (FLONASE) 50 MCG/ACT nasal spray Place 2 sprays into both nostrils daily. 16 g 5  . Fluticasone-Umeclidin-Vilant (TRELEGY ELLIPTA) 100-62.5-25 MCG/INH AEPB Inhale 1 puff into the lungs daily. 60 each 2  . furosemide (LASIX) 20 MG tablet Take 20 mg by mouth every other day.     . hydrALAZINE (APRESOLINE) 50 MG tablet Take 50 mg by mouth every morning.    . hydrochlorothiazide (HYDRODIURIL) 25 MG tablet Take 25 mg by mouth daily.    . insulin regular human CONCENTRATED (HUMULIN R) 500  UNIT/ML injection Inject 110-140 Units into the skin See admin instructions. 110 units at breakfast, 140 units at lunch, 110 units at dinner    . irbesartan (AVAPRO) 150 MG tablet Take 1 tablet (150 mg total) by mouth daily. 30 tablet 3  . levofloxacin (LEVAQUIN) 500 MG tablet Take 1 tablet (500 mg total) by mouth daily. 7 tablet 0  . levothyroxine (SYNTHROID, LEVOTHROID) 150 MCG tablet Take 150 mcg by mouth daily before breakfast.    . lisinopril (ZESTRIL) 40 MG tablet Take 40 mg by mouth daily.    . metFORMIN (GLUCOPHAGE) 500 MG tablet Take  500 mg by mouth 2 (two) times daily with a meal.    . Multiple Vitamin (MULTIVITAMIN WITH MINERALS) TABS tablet Take 1 tablet by mouth daily.    . predniSONE (DELTASONE) 10 MG tablet Take 5 tabs po daily x 2 days; 4 tabs po daily x 2 days; 3 tabs for 2 days; 2 tabs for 2 days; 1 tab for 2 days 30 tablet 0  . Pseudoephedrine-guaiFENesin (MUCINEX D MAX STRENGTH PO) Take 2 tablets by mouth every 4 (four) hours.    Marland Kitchen rOPINIRole (REQUIP) 0.25 MG tablet Take 0.25 mg by mouth at bedtime.    . rosuvastatin (CRESTOR) 40 MG tablet Take 20 mg by mouth daily.     . sertraline (ZOLOFT) 25 MG tablet Take 1 tablet (25 mg total) by mouth daily. 30 tablet 0  . sodium chloride HYPERTONIC 3 % nebulizer solution Take by nebulization 2 (two) times daily. 750 mL 0  . tamsulosin (FLOMAX) 0.4 MG CAPS capsule Take 0.4 mg by mouth.     No facility-administered medications prior to visit.      Review of Systems  Review of Systems   Physical Exam  There were no vitals taken for this visit. Physical Exam   Lab Results:  CBC    Component Value Date/Time   WBC 9.8 07/01/2020 0433   RBC 3.71 (L) 07/01/2020 0433   HGB 10.4 (L) 07/01/2020 0433   HCT 33.3 (L) 07/01/2020 0433   PLT 232 07/01/2020 0433   MCV 89.8 07/01/2020 0433   MCH 28.0 07/01/2020 0433   MCHC 31.2 07/01/2020 0433   RDW 14.5 07/01/2020 0433   LYMPHSABS 1.8 07/01/2020 0433   MONOABS 0.5 07/01/2020 0433   EOSABS 0.0 07/01/2020 0433   BASOSABS 0.0 07/01/2020 0433    BMET    Component Value Date/Time   NA 136 07/01/2020 0433   K 5.0 07/01/2020 0433   CL 103 07/01/2020 0433   CO2 25 07/01/2020 0433   GLUCOSE 276 (H) 07/01/2020 0433   BUN 26 (H) 07/01/2020 0433   CREATININE 1.14 07/01/2020 0433   CREATININE 1.26 07/31/2013 1018   CALCIUM 8.4 (L) 07/01/2020 0433   GFRNONAA >60 07/01/2020 0433   GFRAA >60 07/01/2020 0433    BNP    Component Value Date/Time   BNP 98.3 06/27/2020 0509    ProBNP    Component Value Date/Time    PROBNP <30.0 01/24/2008 1537    Imaging: DG Chest 2 View  Result Date: 09/16/2020 CLINICAL DATA:  Cough and decreased oxygen saturation EXAM: CHEST - 2 VIEW COMPARISON:  June 29, 2020 chest radiograph; chest CT June 27, 2020 FINDINGS: The lungs are clear. Heart size and pulmonary vascularity are normal. No adenopathy. There is aortic atherosclerosis. No bone lesions. IMPRESSION: Lungs clear.  Cardiac silhouette normal. Aortic Atherosclerosis (ICD10-I70.0). Electronically Signed   By: Lowella Grip III M.D.   On:  09/16/2020 14:26     Assessment & Plan:   No problem-specific Assessment & Plan notes found for this encounter.     Martyn Ehrich, NP 09/21/2020

## 2020-09-22 ENCOUNTER — Encounter (HOSPITAL_COMMUNITY): Payer: Medicare HMO

## 2020-09-22 ENCOUNTER — Telehealth (HOSPITAL_COMMUNITY): Payer: Self-pay | Admitting: Family Medicine

## 2020-09-22 ENCOUNTER — Ambulatory Visit: Payer: Medicare HMO | Admitting: Primary Care

## 2020-09-24 ENCOUNTER — Encounter (HOSPITAL_COMMUNITY): Admission: RE | Admit: 2020-09-24 | Payer: Medicare HMO | Source: Ambulatory Visit

## 2020-09-29 ENCOUNTER — Other Ambulatory Visit: Payer: Self-pay

## 2020-09-29 ENCOUNTER — Encounter (HOSPITAL_COMMUNITY)
Admission: RE | Admit: 2020-09-29 | Discharge: 2020-09-29 | Disposition: A | Discharge status: Home / Self Care | Payer: Medicare HMO | Source: Ambulatory Visit | Attending: Emergency Medicine | Admitting: Emergency Medicine

## 2020-09-29 DIAGNOSIS — J449 Chronic obstructive pulmonary disease, unspecified: Secondary | ICD-10-CM | POA: Diagnosis not present

## 2020-09-29 NOTE — Progress Notes (Signed)
Daily Session Note  Patient Details  Name: Sean Saunders MRN: [id number] Date of Birth: 14-Apr-1946 Referring Provider:     Pulmonary Rehab Walk Test from 08/11/2020 in [place]  Referring Provider Dr. Lamonte Sakai      Encounter Date: 09/29/2020  Check In:  Session Check In - 09/29/20 1155      Check-In   Supervising physician immediately available to respond to emergencies [facility] immediately available    Physician(s) Dr. Lonny Prude    Location [facility]    Staff Present Maurice Small, RN, BSN;Jazzmyne Rasnick Ysidro Evert, RN;Jessica Hassell Done, MS, ACSM-CEP, Exercise Physiologist    Virtual Visit No    Medication changes reported     No    Fall or balance concerns reported    No    Tobacco Cessation No Change    Warm-up and Cool-down Performed on first and last piece of equipment    Resistance Training Performed Yes    VAD Patient? No    PAD/SET Patient? No      Pain Assessment   Currently in Pain? No/denies    Multiple Pain Sites No           Capillary Blood Glucose: No results found for this or any previous visit (from the past 24 hour(s)).    Social History   Tobacco Use  Smoking Status Former Smoker  . Packs/day: 1.00  . Years: 25.00  . Pack years: 25.00  . Types: Cigarettes  . Quit date: 06/05/2018  . Years since quitting: 2.3  Smokeless Tobacco Never Used    Goals Met:  Exercise tolerated well No report of cardiac concerns or symptoms Strength training completed today  Goals Unmet:  Not Applicable  Comments: Service time is from 1015 to 1131    Dr. Fransico Him is Medical Director for Cardiac Rehab at [facility]

## 2020-10-01 ENCOUNTER — Other Ambulatory Visit: Payer: Self-pay

## 2020-10-01 ENCOUNTER — Encounter (HOSPITAL_COMMUNITY)
Admission: RE | Admit: 2020-10-01 | Discharge: 2020-10-01 | Disposition: A | Discharge status: Home / Self Care | Payer: Medicare HMO | Source: Ambulatory Visit | Attending: Emergency Medicine | Admitting: Emergency Medicine

## 2020-10-01 DIAGNOSIS — J449 Chronic obstructive pulmonary disease, unspecified: Secondary | ICD-10-CM

## 2020-10-01 NOTE — Progress Notes (Signed)
Daily Session Note  Patient Details  Name: Sean Saunders MRN: [id number] Date of Birth: 24-Jul-1946 Referring Provider:     Pulmonary Rehab Walk Test from 08/11/2020 in [place]  Referring Provider Dr. Lamonte Sakai      Encounter Date: 10/01/2020  Check In:  Session Check In - 10/01/20 1115      Check-In   Supervising physician immediately available to respond to emergencies [facility] immediately available    Physician(s) Dr. Dessa Phi    Location [facility]    Staff Present Maurice Small, RN, BSN;Brae Gartman Ysidro Evert, RN;Jessica Hassell Done, MS, ACSM-CEP, Exercise Physiologist    Virtual Visit No    Medication changes reported     No    Fall or balance concerns reported    No    Tobacco Cessation No Change    Warm-up and Cool-down Performed on first and last piece of equipment    Resistance Training Performed Yes    VAD Patient? No    PAD/SET Patient? No      Pain Assessment   Currently in Pain? No/denies    Multiple Pain Sites No           Capillary Blood Glucose: No results found for this or any previous visit (from the past 24 hour(s)).    Social History   Tobacco Use  Smoking Status Former Smoker  . Packs/day: 1.00  . Years: 25.00  . Pack years: 25.00  . Types: Cigarettes  . Quit date: 06/05/2018  . Years since quitting: 2.3  Smokeless Tobacco Never Used    Goals Met:  Exercise tolerated well No report of cardiac concerns or symptoms Strength training completed today  Goals Unmet:  Not Applicable  Comments: Service time is from 1025 to 1126    Dr. Fransico Him is Medical Director for Cardiac Rehab at [facility].

## 2020-10-06 ENCOUNTER — Other Ambulatory Visit: Payer: Self-pay

## 2020-10-06 ENCOUNTER — Encounter (HOSPITAL_COMMUNITY)
Admission: RE | Admit: 2020-10-06 | Discharge: 2020-10-06 | Disposition: A | Discharge status: Home / Self Care | Payer: Medicare HMO | Source: Ambulatory Visit | Attending: Emergency Medicine | Admitting: Emergency Medicine

## 2020-10-06 DIAGNOSIS — J449 Chronic obstructive pulmonary disease, unspecified: Secondary | ICD-10-CM

## 2020-10-06 NOTE — Progress Notes (Signed)
Daily Session Note  Patient Details  Name: Sean Saunders MRN: [id number] Date of Birth: 05-17-1946 Referring Provider:     Pulmonary Rehab Walk Test from 08/11/2020 in [place]  Referring Provider Dr. Lamonte Sakai      Encounter Date: 10/06/2020  Check In:  Session Check In - 10/06/20 1052      Check-In   Supervising physician immediately available to respond to emergencies [facility] immediately available    Physician(s) Dr. Dessa Phi    Location [facility]    Staff Present Maurice Small, RN, BSN;Lisa Ysidro Evert, RN;Areonna Bran Hassell Done, MS, ACSM-CEP, Exercise Physiologist    Virtual Visit No    Medication changes reported     No    Fall or balance concerns reported    No    Tobacco Cessation No Change    Warm-up and Cool-down Performed on first and last piece of equipment    Resistance Training Performed Yes    VAD Patient? No    PAD/SET Patient? No      Pain Assessment   Currently in Pain? No/denies    Multiple Pain Sites No           Capillary Blood Glucose: No results found for this or any previous visit (from the past 24 hour(s)).    Social History   Tobacco Use  Smoking Status Former Smoker  . Packs/day: 1.00  . Years: 25.00  . Pack years: 25.00  . Types: Cigarettes  . Quit date: 06/05/2018  . Years since quitting: 2.3  Smokeless Tobacco Never Used    Goals Met:  Proper associated with RPD/PD & O2 Sat Exercise tolerated well No report of cardiac concerns or symptoms Strength training completed today  Goals Unmet:  Not Applicable  Comments: Service time is from 1025 to 1145    Dr. Fransico Him is Medical Director for Cardiac Rehab at [facility].

## 2020-10-06 NOTE — Progress Notes (Signed)
I have reviewed a Home Exercise Prescription with [name] . Sean Saunders is currently exercising at home 3 days a week using resistance bands and walking as he is able. The patient was advised to walk 5-6 days a week for 30 minutes.  [name] and I discussed how to progress their exercise prescription.  The patient stated that their goals were to be able to breathe better and not have so much SOB.  The patient stated that they understand the exercise prescription.  We reviewed exercise guidelines, target heart rate during exercise, RPE Scale, weather conditions, Rescue inhaler use, endpoints for exercise, warmup and cool down.  Patient is encouraged to come to me with any questions. I will continue to follow up with the patient to assist them with progression and safety.   Rick Duff MS, ACSM CEP

## 2020-10-08 ENCOUNTER — Encounter (HOSPITAL_COMMUNITY)
Admission: RE | Admit: 2020-10-08 | Discharge: 2020-10-08 | Disposition: A | Discharge status: Home / Self Care | Payer: Medicare HMO | Source: Ambulatory Visit | Attending: Emergency Medicine | Admitting: Emergency Medicine

## 2020-10-08 ENCOUNTER — Other Ambulatory Visit: Payer: Self-pay

## 2020-10-08 VITALS — Wt 225.8 lb

## 2020-10-08 DIAGNOSIS — J449 Chronic obstructive pulmonary disease, unspecified: Secondary | ICD-10-CM

## 2020-10-08 NOTE — Progress Notes (Signed)
Pulmonary Individual Treatment Plan  Patient Details  Name: Sean Saunders MRN: [id number] Date of Birth: 12/20/1945 Referring Provider:     Pulmonary Rehab Walk Test from 08/11/2020 in [facility]  Referring Provider Dr. Lamonte Sakai      Initial Encounter Date:    Pulmonary Rehab Walk Test from 08/11/2020 in [facility]  Date 08/11/20      Visit Diagnosis: Chronic obstructive pulmonary disease, unspecified COPD type ([facility])  Patient's Home Medications on Admission:   Current Outpatient Medications:  .  allopurinol (ZYLOPRIM) 100 MG tablet, Take 100 mg by mouth 2 (two) times daily., Disp: , Rfl:  .  amLODipine (NORVASC) 10 MG tablet, Take 10 mg by mouth daily., Disp: , Rfl:  .  cetirizine (ZYRTEC ALLERGY) 10 MG tablet, Take 1 tablet (10 mg total) by mouth daily., Disp: 30 tablet, Rfl: 5 .  clopidogrel (PLAVIX) 75 MG tablet, Take 75 mg by mouth daily.  , Disp: , Rfl:  .  COMBIVENT RESPIMAT 20-100 MCG/ACT AERS respimat, Inhale 2 puffs into the lungs 3 (three) times daily. , Disp: , Rfl:  .  fluticasone (FLONASE) 50 MCG/ACT nasal spray, Place 2 sprays into both nostrils daily., Disp: 16 g, Rfl: 5 .  Fluticasone-Umeclidin-Vilant (TRELEGY ELLIPTA) 100-62.5-25 MCG/INH AEPB, Inhale 1 puff into the lungs daily., Disp: 60 each, Rfl: 2 .  furosemide (LASIX) 20 MG tablet, Take 20 mg by mouth every other day. , Disp: , Rfl:  .  hydrALAZINE (APRESOLINE) 50 MG tablet, Take 50 mg by mouth every morning., Disp: , Rfl:  .  hydrochlorothiazide (HYDRODIURIL) 25 MG tablet, Take 25 mg by mouth daily., Disp: , Rfl:  .  insulin regular human CONCENTRATED (HUMULIN R) 500 UNIT/ML injection, Inject 110-140 Units into the skin See admin instructions. 110 units at breakfast, 140 units at lunch, 110 units at dinner, Disp: , Rfl:  .  irbesartan (AVAPRO) 150 MG tablet, Take 1 tablet (150 mg total) by mouth daily., Disp: 30 tablet, Rfl: 3 .  levofloxacin  (LEVAQUIN) 500 MG tablet, Take 1 tablet (500 mg total) by mouth daily., Disp: 7 tablet, Rfl: 0 .  levothyroxine (SYNTHROID, LEVOTHROID) 150 MCG tablet, Take 150 mcg by mouth daily before breakfast., Disp: , Rfl:  .  lisinopril (ZESTRIL) 40 MG tablet, Take 40 mg by mouth daily., Disp: , Rfl:  .  metFORMIN (GLUCOPHAGE) 500 MG tablet, Take 500 mg by mouth 2 (two) times daily with a meal., Disp: , Rfl:  .  Multiple Vitamin (MULTIVITAMIN WITH MINERALS) TABS tablet, Take 1 tablet by mouth daily., Disp: , Rfl:  .  predniSONE (DELTASONE) 10 MG tablet, Take 5 tabs po daily x 2 days; 4 tabs po daily x 2 days; 3 tabs for 2 days; 2 tabs for 2 days; 1 tab for 2 days, Disp: 30 tablet, Rfl: 0 .  Pseudoephedrine-guaiFENesin (MUCINEX D MAX STRENGTH PO), Take 2 tablets by mouth every 4 (four) hours., Disp: , Rfl:  .  rOPINIRole (REQUIP) 0.25 MG tablet, Take 0.25 mg by mouth at bedtime., Disp: , Rfl:  .  rosuvastatin (CRESTOR) 40 MG tablet, Take 20 mg by mouth daily. , Disp: , Rfl:  .  sertraline (ZOLOFT) 25 MG tablet, Take 1 tablet (25 mg total) by mouth daily., Disp: 30 tablet, Rfl: 0 .  sodium chloride HYPERTONIC 3 % nebulizer solution, Take by nebulization 2 (two) times daily., Disp: 750 mL, Rfl: 0 .  tamsulosin (FLOMAX) 0.4 MG CAPS capsule, Take  0.4 mg by mouth., Disp: , Rfl:   Past Medical History: Past Medical History:  Diagnosis Date  . Arthritis   . COPD (chronic obstructive pulmonary disease) (HCC)   . Diabetes mellitus   . DVT (deep venous thrombosis) (HCC)   . GERD (gastroesophageal reflux disease)   . Hyperlipidemia   . Hypertension   . Hypothyroidism   . Onychomycosis 06/07/2013  . Stroke [facility])    TIA's 01/05/11-01/08/11-01/26/11  . TIA (transient ischemic attack) 08/03/2011   Left sided weakness. 3 episodes Feb-March    Tobacco Use: Social History   Tobacco Use  Smoking Status Former Smoker  . Packs/day: 1.00  . Years: 25.00  . Pack years: 25.00  . Types: Cigarettes  . Quit date:  06/05/2018  . Years since quitting: 2.3  Smokeless Tobacco Never Used    Labs: Recent Review Advice worker    Labs for ITP Cardiac and Pulmonary Rehab Latest Ref Rng & Units 06/04/2012 12/31/2019 01/01/2020 06/27/2020 06/29/2020   Cholestrol 0 - 200 mg/dL - - - - -   LDLCALC 0 - 99 mg/dL - - - - -   HDL >05 mg/dL - - - - -   Trlycerides <150 mg/dL - 186(H) 635(Q) - -   Hemoglobin A1c 4.8 - 5.6 % - 7.7(H) - 8.2(H) -   PHART 7.35 - 7.45 - - - - 7.437   PCO2ART 32 - 48 mmHg - - - - 37.3   HCO3 20.0 - 28.0 mmol/L - - - - 24.8   TCO2 0 - 100 mmol/L 21 - - - -   O2SAT % - - - - 96.3      Capillary Blood Glucose: Lab Results  Component Value Date   GLUCAP 86 08/20/2020   GLUCAP 137 (H) 08/20/2020   GLUCAP 298 (H) 07/01/2020   GLUCAP 300 (H) 07/01/2020   GLUCAP 292 (H) 07/01/2020    POCT Glucose    Row Name 08/11/20 1510 08/18/20 1233 08/27/20 1445 09/08/20 1511 09/15/20 1224     POCT Blood Glucose   Pre-Exercise 246 mg/dL 685 mg/dL  Fasting 398 -- -- --   Post-Exercise -- 165 mg/dL -- -- --   Pre-Exercise #2 -- -- 171 mg/dL -- --   Post-Exercise #2 -- -- 102 mg/dL -- --   Pre-Exercise #3 -- -- -- 204 mg/dL --   Post-Exercise #3 -- -- -- 99 mg/dL --   Random -- -- -- -- 239 mg/dL          Pulmonary Assessment Scores:  Pulmonary Assessment Scores    Row Name 08/11/20 1157 08/11/20 1543       ADL UCSD   SOB Score total 58 --      CAT Score   CAT Score 21 --      mMRC Score   mMRC Score -- 4          UCSD: Self-administered rating of dyspnea associated with activities of daily living (ADLs) 6-point scale (0 = "not at all" to 5 = "maximal or unable to do because of breathlessness")  Scoring Scores range from 0 to 120.  Minimally important difference is 5 units  CAT: CAT can identify the health impairment of COPD patients and is better correlated with disease progression.  CAT has a scoring range of zero to 40. The CAT score is classified into four groups of low  (less than 10), medium (10 - 20), high (21-30) and very high (31-40) based on the impact level  of disease on health status. A CAT score over 10 suggests significant symptoms.  A worsening CAT score could be explained by an exacerbation, poor medication adherence, poor inhaler technique, or progression of COPD or comorbid conditions.  CAT MCID is 2 points  mMRC: mMRC (Modified Medical Research Council) Dyspnea Scale is used to assess the degree of baseline functional disability in patients of respiratory disease due to dyspnea. No minimal important difference is established. A decrease in score of 1 point or greater is considered a positive change.   Pulmonary Function Assessment:  Pulmonary Function Assessment - 08/11/20 1508      Breath   Bilateral Breath Sounds Rhonchi;Decreased   A few scatteried rhonchi and decreased breath sounds throughout   Shortness of Breath Yes;Limiting activity           Exercise Target Goals: Exercise Program Goal: Individual exercise prescription set using results from initial 6 min walk test and THRR while considering  patient's activity barriers and safety.   Exercise Prescription Goal: Initial exercise prescription builds to 30-45 minutes a day of aerobic activity, 2-3 days per week.  Home exercise guidelines will be given to patient during program as part of exercise prescription that the participant will acknowledge.  Activity Barriers & Risk Stratification:  Activity Barriers & Cardiac Risk Stratification - 08/11/20 0920      Activity Barriers & Cardiac Risk Stratification   Activity Barriers Right Hip Replacement;Joint Problems;Deconditioning;Shortness of Breath;Muscular Weakness    Cardiac Risk Stratification High           6 Minute Walk:  6 Minute Walk    Row Name 08/11/20 1543         6 Minute Walk   Distance 681 feet     Walk Time 6 minutes     # of Rest Breaks 1  pt had to stop at minute 4 due to severe shortness of breath and  feeling light headed     MPH 1.29     METS 1.59     RPE 12     Perceived Dyspnea  3     VO2 Peak 5.56     Symptoms Yes (comment)     Comments pt started becoming wobbly and unstable around the 4th minute of the walk test. Had him sit down at that time to check vitals and asses how he was feeling. Pt stated he was very short of breath and finally admitted to being somewhat light headed. we monitored his vitals and everything was normal. pt stopped walk test at 4 minutes.     Resting HR 81 bpm     Resting BP 126/62     Resting Oxygen Saturation  97 %     Exercise Oxygen Saturation  during 6 min walk 96 %     Max Ex. HR 97 bpm     Max Ex. BP 164/54     2 Minute Post BP 132/58       Interval HR   1 Minute HR 91     2 Minute HR 96     3 Minute HR 97     4 Minute HR 96     5 Minute HR 88     6 Minute HR 88     2 Minute Post HR 84     Interval Heart Rate? Yes       Interval Oxygen   Interval Oxygen? Yes     Baseline Oxygen Saturation % 97 %  1 Minute Oxygen Saturation % 96 %     1 Minute Liters of Oxygen 0 L     2 Minute Oxygen Saturation % 97 %     2 Minute Liters of Oxygen 0 L     3 Minute Oxygen Saturation % 97 %     3 Minute Liters of Oxygen 0 L     4 Minute Oxygen Saturation % 98 %     4 Minute Liters of Oxygen 0 L     5 Minute Oxygen Saturation % 98 %     5 Minute Liters of Oxygen 0 L     6 Minute Oxygen Saturation % 98 %     6 Minute Liters of Oxygen 0 L     2 Minute Post Oxygen Saturation % 98 %     2 Minute Post Liters of Oxygen 0 L            Oxygen Initial Assessment:  Oxygen Initial Assessment - 08/11/20 0921      Home Oxygen   Home Oxygen Device None    Sleep Oxygen Prescription None    Home Exercise Oxygen Prescription None    Home Resting Oxygen Prescription None    Compliance with Home Oxygen Use Yes           Oxygen Re-Evaluation:  Oxygen Re-Evaluation    Row Name 08/20/20 1500 09/08/20 1510 10/06/20 1559         Program Oxygen  Prescription   Program Oxygen Prescription None None None       Home Oxygen   Home Oxygen Device None None None     Sleep Oxygen Prescription None -- None     Home Exercise Oxygen Prescription None None None     Home Resting Oxygen Prescription None None None     Compliance with Home Oxygen Use Yes Yes Yes       Goals/Expected Outcomes   Short Term Goals To learn and exhibit compliance with exercise, home and travel O2 prescription;To learn and understand importance of monitoring SPO2 with pulse oximeter and demonstrate accurate use of the pulse oximeter.;To learn and understand importance of maintaining oxygen saturations>88%;To learn and demonstrate proper pursed lip breathing techniques or other breathing techniques.;To learn and demonstrate proper use of respiratory medications To learn and exhibit compliance with exercise, home and travel O2 prescription;To learn and understand importance of monitoring SPO2 with pulse oximeter and demonstrate accurate use of the pulse oximeter.;To learn and understand importance of maintaining oxygen saturations>88%;To learn and demonstrate proper pursed lip breathing techniques or other breathing techniques.;To learn and demonstrate proper use of respiratory medications To learn and exhibit compliance with exercise, home and travel O2 prescription;To learn and understand importance of monitoring SPO2 with pulse oximeter and demonstrate accurate use of the pulse oximeter.;To learn and understand importance of maintaining oxygen saturations>88%;To learn and demonstrate proper pursed lip breathing techniques or other breathing techniques.;To learn and demonstrate proper use of respiratory medications     Long  Term Goals Exhibits compliance with exercise, home and travel O2 prescription;Verbalizes importance of monitoring SPO2 with pulse oximeter and return demonstration;Maintenance of O2 saturations>88%;Exhibits proper breathing techniques, such as pursed lip  breathing or other method taught during program session;Compliance with respiratory medication;Demonstrates proper use of MDI's Exhibits compliance with exercise, home and travel O2 prescription;Verbalizes importance of monitoring SPO2 with pulse oximeter and return demonstration;Maintenance of O2 saturations>88%;Exhibits proper breathing techniques, such as pursed lip breathing or other method taught during program  session;Compliance with respiratory medication;Demonstrates proper use of MDI's Exhibits compliance with exercise, home and travel O2 prescription;Verbalizes importance of monitoring SPO2 with pulse oximeter and return demonstration;Maintenance of O2 saturations>88%;Exhibits proper breathing techniques, such as pursed lip breathing or other method taught during program session;Compliance with respiratory medication;Demonstrates proper use of MDI's     Goals/Expected Outcomes compliance compliance and understanding of oxygen saturation and pursed lip breathing compliance and understanding of oxygen saturation and pursed lip breathing            Oxygen Discharge (Final Oxygen Re-Evaluation):  Oxygen Re-Evaluation - 10/06/20 1559      Program Oxygen Prescription   Program Oxygen Prescription None      Home Oxygen   Home Oxygen Device None    Sleep Oxygen Prescription None    Home Exercise Oxygen Prescription None    Home Resting Oxygen Prescription None    Compliance with Home Oxygen Use Yes      Goals/Expected Outcomes   Short Term Goals To learn and exhibit compliance with exercise, home and travel O2 prescription;To learn and understand importance of monitoring SPO2 with pulse oximeter and demonstrate accurate use of the pulse oximeter.;To learn and understand importance of maintaining oxygen saturations>88%;To learn and demonstrate proper pursed lip breathing techniques or other breathing techniques.;To learn and demonstrate proper use of respiratory medications    Long  Term  Goals Exhibits compliance with exercise, home and travel O2 prescription;Verbalizes importance of monitoring SPO2 with pulse oximeter and return demonstration;Maintenance of O2 saturations>88%;Exhibits proper breathing techniques, such as pursed lip breathing or other method taught during program session;Compliance with respiratory medication;Demonstrates proper use of MDI's    Goals/Expected Outcomes compliance and understanding of oxygen saturation and pursed lip breathing           Initial Exercise Prescription:  Initial Exercise Prescription - 08/11/20 1500      Date of Initial Exercise RX and Referring Provider   Date 08/11/20    Referring Provider Dr. Lamonte Sakai    Expected Discharge Date 10/08/20      NuStep   Level 2    SPM 80    Minutes 30    METs 1.6      Prescription Details   Frequency (times per week) 2    Duration Progress to 30 minutes of continuous aerobic without signs/symptoms of physical distress      Intensity   THRR 40-80% of Max Heartrate 58-116    Ratings of Perceived Exertion 11-13    Perceived Dyspnea 0-4      Progression   Progression Continue to progress workloads to maintain intensity without signs/symptoms of physical distress.      Resistance Training   Training Prescription Yes    Weight blue bands    Reps 10-15           Perform Capillary Blood Glucose checks as needed.  Exercise Prescription Changes:  Exercise Prescription Changes    Row Name 08/18/20 1200 08/27/20 1445 09/08/20 1500 09/15/20 1225 10/06/20 1500     Response to Exercise   Blood Pressure (Admit) 158/62 164/70 136/64 108/66 130/54   Blood Pressure (Exercise) 134/70 -- -- 138/80 144/60   Blood Pressure (Exit) 130/60 152/54 140/70 144/60 110/56   Heart Rate (Admit) 86 bpm 85 bpm 80 bpm 93 bpm 92 bpm   Heart Rate (Exercise) 86 bpm 91 bpm 100 bpm 97 bpm 105 bpm   Heart Rate (Exit) 83 bpm 93 bpm 95 bpm 94 bpm 91 bpm   Oxygen  Saturation (Admit) 98 % 98 % 99 % 98 % 97 %    Oxygen Saturation (Exercise) 98 % 97 % 98 % 99 % 95 %   Oxygen Saturation (Exit) 99 % 97 % 97 % 97 % 98 %   Rating of Perceived Exertion (Exercise) _0 Perceived Dyspnea (Exercise) _1 Symptoms -- -- -- -- SOB   Duration Progress to 30 minutes of  aerobic without signs/symptoms of physical distress Continue with 30 min of aerobic exercise without signs/symptoms of physical distress. Continue with 30 min of aerobic exercise without signs/symptoms of physical distress. Continue with 30 min of aerobic exercise without signs/symptoms of physical distress. Continue with 30 min of aerobic exercise without signs/symptoms of physical distress.   Intensity Other (comment)  40-80% of HRR THRR unchanged THRR unchanged THRR unchanged THRR unchanged     Progression   Progression Continue to progress workloads to maintain intensity without signs/symptoms of physical distress. Continue to progress workloads to maintain intensity without signs/symptoms of physical distress. Continue to progress workloads to maintain intensity without signs/symptoms of physical distress. Continue to progress workloads to maintain intensity without signs/symptoms of physical distress. Continue to progress workloads to maintain intensity without signs/symptoms of physical distress.   Average METs -- -- 2.1 2.1 2.2     Resistance Training   Training Prescription No Yes Yes Yes Yes   Weight _2    Reps 10-15 10-15 10-15 10-15 10-15   Time 10 Minutes 10 Minutes -- 10 Minutes 10 Minutes     NuStep   Level _3 SPM 80 80 80 80 80   Minutes _4 METs 1.8 2 2.1 2 2.2     Home Exercise Plan   Plans to continue exercise at -- -- -- -- Home (comment)  Resistance bands and walking   Frequency -- -- -- -- Add 2 additional days to program exercise sessions.   Initial Home Exercises Provided -- -- -- -- 10/06/20          Exercise Comments:   Exercise Comments    Row Name 10/06/20 1522           Exercise Comments Completed home exercise today. Pt states he does the bands at home 3 times per week and tries walking but never gets very far because of his breathing difficulties. I gave him suggestions and recommendations for exercising at home. Will continue to follow.              Exercise Goals and Review:  Exercise Goals    Row Name 08/11/20 1550 08/20/20 1502           Exercise Goals   Increase Physical Activity Yes Yes      Intervention Provide advice, education, support and counseling about physical activity/exercise needs.;Develop an individualized exercise prescription for aerobic and resistive training based on initial evaluation findings, risk stratification, comorbidities and participant's personal goals. Provide advice, education, support and counseling about physical activity/exercise needs.;Develop an individualized exercise prescription for aerobic and resistive training based on initial evaluation findings, risk stratification, comorbidities and participant's personal goals.      Expected Outcomes Short Term: Attend rehab on a regular basis to increase amount of physical activity.;Long Term: Add in home exercise to make exercise part of routine and to increase amount of physical activity.;Long Term: Exercising  regularly at least 3-5 days a week. Short Term: Attend rehab on a regular basis to increase amount of physical activity.;Long Term: Add in home exercise to make exercise part of routine and to increase amount of physical activity.;Long Term: Exercising regularly at least 3-5 days a week.      Increase Strength and Stamina Yes Yes      Intervention Provide advice, education, support and counseling about physical activity/exercise needs.;Develop an individualized exercise prescription for aerobic and resistive training based on initial evaluation findings, risk stratification, comorbidities and participant's  personal goals. Provide advice, education, support and counseling about physical activity/exercise needs.;Develop an individualized exercise prescription for aerobic and resistive training based on initial evaluation findings, risk stratification, comorbidities and participant's personal goals.      Expected Outcomes Short Term: Increase workloads from initial exercise prescription for resistance, speed, and METs.;Short Term: Perform resistance training exercises routinely during rehab and add in resistance training at home;Long Term: Improve cardiorespiratory fitness, muscular endurance and strength as measured by increased METs and functional capacity (6MWT) Short Term: Increase workloads from initial exercise prescription for resistance, speed, and METs.;Short Term: Perform resistance training exercises routinely during rehab and add in resistance training at home;Long Term: Improve cardiorespiratory fitness, muscular endurance and strength as measured by increased METs and functional capacity (6MWT)      Able to understand and use rate of perceived exertion (RPE) scale Yes Yes      Intervention Provide education and explanation on how to use RPE scale Provide education and explanation on how to use RPE scale      Expected Outcomes Short Term: Able to use RPE daily in rehab to express subjective intensity level;Long Term:  Able to use RPE to guide intensity level when exercising independently Short Term: Able to use RPE daily in rehab to express subjective intensity level;Long Term:  Able to use RPE to guide intensity level when exercising independently      Able to understand and use Dyspnea scale Yes Yes      Intervention Provide education and explanation on how to use Dyspnea scale Provide education and explanation on how to use Dyspnea scale      Expected Outcomes Short Term: Able to use Dyspnea scale daily in rehab to express subjective sense of shortness of breath during exertion;Long Term: Able to  use Dyspnea scale to guide intensity level when exercising independently Short Term: Able to use Dyspnea scale daily in rehab to express subjective sense of shortness of breath during exertion;Long Term: Able to use Dyspnea scale to guide intensity level when exercising independently      Knowledge and understanding of Target Heart Rate Range (THRR) Yes Yes      Intervention Provide education and explanation of THRR including how the numbers were predicted and where they are located for reference Provide education and explanation of THRR including how the numbers were predicted and where they are located for reference      Expected Outcomes Short Term: Able to state/look up THRR;Long Term: Able to use THRR to govern intensity when exercising independently;Short Term: Able to use daily as guideline for intensity in rehab Short Term: Able to state/look up THRR;Long Term: Able to use THRR to govern intensity when exercising independently;Short Term: Able to use daily as guideline for intensity in rehab      Understanding of Exercise Prescription Yes Yes      Intervention Provide education, explanation, and written materials on patient's individual exercise prescription Provide education, explanation,  and written materials on patient's individual exercise prescription      Expected Outcomes Short Term: Able to explain program exercise prescription;Long Term: Able to explain home exercise prescription to exercise independently Short Term: Able to explain program exercise prescription;Long Term: Able to explain home exercise prescription to exercise independently             Exercise Goals Re-Evaluation :  Exercise Goals Re-Evaluation    Row Name 08/20/20 1503 08/20/20 1510 09/08/20 1508 10/06/20 1554       Exercise Goal Re-Evaluation   Exercise Goals Review Increase Physical Activity;Increase Strength and Stamina;Able to understand and use rate of perceived exertion (RPE) scale;Able to understand and  use Dyspnea scale;Knowledge and understanding of Target Heart Rate Range (THRR);Able to check pulse independently;Understanding of Exercise Prescription;Improve claudication pain tolerance and improve walking ability -- Increase Physical Activity;Increase Strength and Stamina;Able to understand and use rate of perceived exertion (RPE) scale;Able to understand and use Dyspnea scale;Knowledge and understanding of Target Heart Rate Range (THRR);Understanding of Exercise Prescription Increase Physical Activity;Increase Strength and Stamina;Able to understand and use rate of perceived exertion (RPE) scale;Able to understand and use Dyspnea scale;Knowledge and understanding of Target Heart Rate Range (THRR);Understanding of Exercise Prescription    Comments -- pt has completed 2 exercise sessions with rehab. He has tolerated exercise well so far and is exercising at 1.9 METs on the Nustep. Pt has completed 6 exercise sessions and has been slow to make progressions. He is exercising at 2.1 METS on the Nustep. We will continue to monitor and progress as he is able. Pt has completed 11 exercise sessions and has made some progressions with workload increases and MET increases. He is currently exercising at 2.2 METS on the Nustep. Pt stil c/o SOB with all activity. Will continue to monitor and progress as he is able.    Expected Outcomes Through exercise at rehab and home the patient will decrease shortness of breath with daily activities and feel confident in carrying out an exercise regimn at home -- Through exercise at rehab and home the patient will decrease shortness of breath with daily activities and feel confident in carrying out an exercise regimn at home Through exercise at rehab and home the patient will decrease shortness of breath with daily activities and feel confident in carrying out an exercise regimn at home           Discharge Exercise Prescription (Final Exercise Prescription Changes):  Exercise  Prescription Changes - 10/06/20 1500      Response to Exercise   Blood Pressure (Admit) 130/54    Blood Pressure (Exercise) 144/60    Blood Pressure (Exit) 110/56    Heart Rate (Admit) 92 bpm    Heart Rate (Exercise) 105 bpm    Heart Rate (Exit) 91 bpm    Oxygen Saturation (Admit) 97 %    Oxygen Saturation (Exercise) 95 %    Oxygen Saturation (Exit) 98 %    Rating of Perceived Exertion (Exercise) 15    Perceived Dyspnea (Exercise) 3    Symptoms SOB    Duration Continue with 30 min of aerobic exercise without signs/symptoms of physical distress.    Intensity THRR unchanged      Progression   Progression Continue to progress workloads to maintain intensity without signs/symptoms of physical distress.    Average METs 2.2      Resistance Training   Training Prescription Yes    Weight blue bands    Reps 10-15  Time 10 Minutes      NuStep   Level 6    SPM 80    Minutes 30    METs 2.2      Home Exercise Plan   Plans to continue exercise at Home (comment)   Resistance bands and walking   Frequency Add 2 additional days to program exercise sessions.    Initial Home Exercises Provided 10/06/20           Nutrition:  Target Goals: Understanding of nutrition guidelines, daily intake of sodium <1572m, cholesterol <2061m calories 30% from fat and 7% or less from saturated fats, daily to have 5 or more servings of fruits and vegetables.  Biometrics:  Pre Biometrics - 08/11/20 1510      Pre Biometrics   Grip Strength 41 kg            Nutrition Therapy Plan and Nutrition Goals:  Nutrition Therapy & Goals - 09/08/20 1323      Nutrition Therapy   Diet Carb modified      Personal Nutrition Goals   Personal Goal #2 CBG concentrations in the normal range or as close to normal as is safely possible.      Intervention Plan   Intervention Prescribe, educate and counsel regarding individualized specific dietary modifications aiming towards targeted core components such as  weight, hypertension, lipid management, diabetes, heart failure and other comorbidities.    Expected Outcomes Short Term Goal: A plan has been developed with personal nutrition goals set during dietitian appointment.;Long Term Goal: Adherence to prescribed nutrition plan.           Nutrition Assessments:  Nutrition Assessments - 10/07/20 1441      Rate Your Plate Scores   Pre Score 60           Nutrition Goals Re-Evaluation:  Nutrition Goals Re-Evaluation    Row Name 09/08/20 1323 09/08/20 1324 09/09/20 0827 10/08/20 0710       Goals   Current Weight 233 lb (105.7 kg) 233 lb (105.7 kg) 235 lb 7.2 oz (106.8 kg) 224 lb 13.9 oz (102 kg)    Nutrition Goal Improved blood glucose control as evidenced by pt's A1c trending from 8.2 toward less than 7.0. Improved blood glucose control as evidenced by pt's A1c trending from 8.2 toward less than 7.0. Improved blood glucose control as evidenced by pt's A1c trending from 8.2 toward less than 7.0. Improved blood glucose control as evidenced by pt's A1c trending from 8.2 toward less than 7.0.    Comment -- -- Reviewed hypoglycemia protocol/rule of 15's Pt has lost 9 lbs in 4 week      Personal Goal #2 Re-Evaluation   Personal Goal #2 -- -- CBG concentrations in the normal range or as close to normal as is safely possible. CBG concentrations in the normal range or as close to normal as is safely possible.           Nutrition Goals Discharge (Final Nutrition Goals Re-Evaluation):  Nutrition Goals Re-Evaluation - 10/08/20 0710      Goals   Current Weight 224 lb 13.9 oz (102 kg)    Nutrition Goal Improved blood glucose control as evidenced by pt's A1c trending from 8.2 toward less than 7.0.    Comment Pt has lost 9 lbs in 4 week      Personal Goal #2 Re-Evaluation   Personal Goal #2 CBG concentrations in the normal range or as close to normal as is safely possible.  Psychosocial: Target Goals: Acknowledge presence or absence  of significant depression and/or stress, maximize coping skills, provide positive support system. Participant is able to verbalize types and ability to use techniques and skills needed for reducing stress and depression.  Initial Review & Psychosocial Screening:  Initial Psych Review & Screening - 08/11/20 0923      Initial Review   Current issues with Current Depression      Family Dynamics   Good Support System? Yes    Concerns Recent loss of significant other      Barriers   Psychosocial barriers to participate in program The patient should benefit from training in stress management and relaxation.      Screening Interventions   Interventions Encouraged to exercise    Expected Outcomes Long Term Goal: Stressors or current issues are controlled or eliminated.;Short Term goal: Identification and review with participant of any Quality of Life or Depression concerns found by scoring the questionnaire.;Long Term goal: The participant improves quality of Life and PHQ9 Scores as seen by post scores and/or verbalization of changes           Quality of Life Scores:  Scores of 19 and below usually indicate a poorer quality of life in these areas.  A difference of  2-3 points is a clinically meaningful difference.  A difference of 2-3 points in the total score of the Quality of Life Index has been associated with significant improvement in overall quality of life, self-image, physical symptoms, and general health in studies assessing change in quality of life.  PHQ-9: Recent Review Flowsheet Data    Depression screen [facility] 2/9 08/25/2020 08/11/2020 09/17/2018   Decreased Interest 0 3  0   Down, Depressed, Hopeless 0 2 0   PHQ - 2 Score 0 5 0   Altered sleeping - 0 -   Tired, decreased energy - 3 -   Change in appetite - 3 -   Feeling bad or failure about yourself  - 0 -   Trouble concentrating - 0 -   Moving slowly or fidgety/restless - 0 -   Suicidal thoughts - 0 -   PHQ-9 Score - 11 -    Difficult doing work/chores - Not difficult at all -     Interpretation of Total Score  Total Score Depression Severity:  1-4 = Minimal depression, 5-9 = Mild depression, 10-14 = Moderate depression, 15-19 = Moderately severe depression, 20-27 = Severe depression   Psychosocial Evaluation and Intervention:  Psychosocial Evaluation - 09/07/20 1451      Psychosocial Evaluation & Interventions   Interventions Stress management education;Relaxation education;Encouraged to exercise with the program and follow exercise prescription    Comments Pt at times is difficult to assess his psychosocial needs.  Pt rambles with different topics during a conversation difficult to discern what is presently occuring and what has happened in the past and is no longer a concern.  Pt feesl supported by his daughter and granddaughter.    Expected Outcomes Pt will employ pt  preferred non medication tools to assist in managment of his depression such as increased physical activity    Continue Psychosocial Services  Follow up required by staff           Psychosocial Re-Evaluation:  Psychosocial Re-Evaluation    [place] Name 08/19/20 1510 08/19/20 1511 08/19/20 1633 09/07/20 1537 10/07/20 1626     Psychosocial Re-Evaluation   Current issues with Current Depression Current Depression -- Current Depression Current Depression (P)  Comments Pt has completed 1 exercise session. Will continue to engage with pt to establish rapport. Pt has completed 1 exercise session. Will continue to engage with pt to establish rapport. Aking  has completed 1 exercise session. Will continue to engage with pt to establish rapport. Craven  has completed 5 exercise sessions.  Pt has anti-depressant ordered but he does not want to take.  Pt is looking at non pharmacologic agents for his depression Denilson  has completed 11 exercise sessions.  Pt has anti-depressant ordered but he does not want to take.  Pt is looking at non pharmacologic  agents for his depression.  Missed exercise sessions due to COPD excerbation. Difficult to assess his psychosocial well being as he often rambles on and off topic and everything is negative (P)    Expected Outcomes Pt will demonstrate positve and healthy coping skills to manage his current depression Pt will demonstrate positve and healthy coping skills to manage his current depression with emphasis on increasing physical activity Sanuel will demonstrate positve and healthy coping skills to manage his current depression with emphasis on increasing physical activity Phuc will be able to manage his current depression with natural and herbal remedies. Kyzer will be able to manage his current depression with his preferred  managment style natural and herbal remedies.  Acceptance of the chronic nature of COPD. (P)    Interventions Stress management education;Relaxation education;Encouraged to attend Pulmonary Rehabilitation for the exercise Stress management education;Relaxation education;Encouraged to attend Pulmonary Rehabilitation for the exercise -- Stress management education;Relaxation education;Encouraged to attend Pulmonary Rehabilitation for the exercise Encouraged to attend Pulmonary Rehabilitation for the exercise;Relaxation education (P)    Continue Psychosocial Services  -- Follow up required by staff -- Follow up required by staff Follow up required by counselor (P)           Psychosocial Discharge (Final Psychosocial Re-Evaluation):  Psychosocial Re-Evaluation - 10/07/20 1626      Psychosocial Re-Evaluation   Current issues with Current Depression (P)     Comments Damoni  has completed 11 exercise sessions.  Pt has anti-depressant ordered but he does not want to take.  Pt is looking at non pharmacologic agents for his depression.  Missed exercise sessions due to COPD excerbation. Difficult to assess his psychosocial well being as he often rambles on and off topic and everything is  negative (P)     Expected Outcomes Landen will be able to manage his current depression with his preferred  managment style natural and herbal remedies.  Acceptance of the chronic nature of COPD. (P)     Interventions Encouraged to attend Pulmonary Rehabilitation for the exercise;Relaxation education (P)     Continue Psychosocial Services  Follow up required by counselor (P)            Education: Education Goals: Education classes will be provided on a weekly basis, covering required topics. Participant will state understanding/return demonstration of topics presented.  Learning Barriers/Preferences:  Learning Barriers/Preferences - 08/11/20 0925      Learning Barriers/Preferences   Learning Barriers Sight;Hearing    Learning Preferences Written Material;Computer/Internet           Education Topics: Risk Factor Reduction:  -Group instruction that is supported by a PowerPoint presentation. Instructor discusses the definition of a risk factor, different risk factors for pulmonary disease, and how the heart and lungs work together.     PULMONARY REHAB OTHER RESPIRATORY from 10/08/2020 in [place]  Date 10/08/20  Educator Handout      Nutrition for Pulmonary Patient:  -Group instruction provided by PowerPoint slides, verbal discussion, and written materials to support subject matter. The instructor gives an explanation and review of healthy diet recommendations, which includes a discussion on weight management, recommendations for fruit and vegetable consumption, as well as protein, fluid, caffeine, fiber, sodium, sugar, and alcohol. Tips for eating when patients are short of breath are discussed.   Pursed Lip Breathing:  -Group instruction that is supported by demonstration and informational handouts. Instructor discusses the benefits of pursed lip and diaphragmatic breathing and detailed demonstration on how to preform both.     PULMONARY REHAB  OTHER RESPIRATORY from 10/08/2020 in [place]  Date 10/01/20  Educator Handout      Oxygen Safety:  -Group instruction provided by PowerPoint, verbal discussion, and written material to support subject matter. There is an overview of "What is Oxygen" and "Why do we need it".  Instructor also reviews how to create a safe environment for oxygen use, the importance of using oxygen as prescribed, and the risks of noncompliance. There is a brief discussion on traveling with oxygen and resources the patient may utilize.   Oxygen Equipment:  -Group instruction provided by [facility] Staff utilizing handouts, written materials, and equipment demonstrations.   Signs and Symptoms:  -Group instruction provided by written material and verbal discussion to support subject matter. Warning signs and symptoms of infection, stroke, and heart attack are reviewed and when to call the physician/911 reinforced. Tips for preventing the spread of infection discussed.   Advanced Directives:  -Group instruction provided by verbal instruction and written material to support subject matter. Instructor reviews Advanced Directive laws and proper instruction for filling out document.   Pulmonary Video:  -Group video education that reviews the importance of medication and oxygen compliance, exercise, good nutrition, pulmonary hygiene, and pursed lip and diaphragmatic breathing for the pulmonary patient.   Exercise for the Pulmonary Patient:  -Group instruction that is supported by a PowerPoint presentation. Instructor discusses benefits of exercise, core components of exercise, frequency, duration, and intensity of an exercise routine, importance of utilizing pulse oximetry during exercise, safety while exercising, and options of places to exercise outside of rehab.     Pulmonary Medications:  -Verbally interactive group education provided by instructor with focus on inhaled  medications and proper administration.   Anatomy and Physiology of the Respiratory System and Intimacy:  -Group instruction provided by PowerPoint, verbal discussion, and written material to support subject matter. Instructor reviews respiratory cycle and anatomical components of the respiratory system and their functions. Instructor also reviews differences in obstructive and restrictive respiratory diseases with examples of each. Intimacy, Sex, and Sexuality differences are reviewed with a discussion on how relationships can change when diagnosed with pulmonary disease. Common sexual concerns are reviewed.   PULMONARY REHAB OTHER RESPIRATORY from 10/08/2020 in [place]  Date 10/01/20  Educator Handout      MD DAY -A group question and answer session with a medical doctor that allows participants to ask questions that relate to their pulmonary disease state.   OTHER EDUCATION -Group or individual verbal, written, or video instructions that support the educational goals of the pulmonary rehab program.   PULMONARY REHAB OTHER RESPIRATORY from 10/08/2020 in [place]  Date 10/06/20  Jeannie Fend My Plate]  Educator Ailene Ravel H/O  Instruction Review Code 1- Verbalizes Understanding  Holiday Eating Survival Tips:  -Group instruction provided by PowerPoint slides, verbal discussion, and written materials to support subject matter. The instructor gives patients tips, tricks, and techniques to help them not only survive but enjoy the holidays despite the onslaught of food that accompanies the holidays.   Knowledge Questionnaire Score:  Knowledge Questionnaire Score - 08/11/20 1156      Knowledge Questionnaire Score   Pre Score 13/18           Core Components/Risk Factors/Patient Goals at Admission:  Personal Goals and Risk Factors at Admission - 08/11/20 0926      Core Components/Risk Factors/Patient Goals on Admission     Weight Management Weight Loss    Goal Weight: Long Term 200 lb (90.7 kg)    Improve shortness of breath with ADL's Yes    Intervention Provide education, individualized exercise plan and daily activity instruction to help decrease symptoms of SOB with activities of daily living.    Expected Outcomes Short Term: Improve cardiorespiratory fitness to achieve a reduction of symptoms when performing ADLs;Long Term: Be able to perform more ADLs without symptoms or delay the onset of symptoms    Diabetes Yes    Intervention Provide education about signs/symptoms and action to take for hypo/hyperglycemia.;Provide education about proper nutrition, including hydration, and aerobic/resistive exercise prescription along with prescribed medications to achieve blood glucose in normal ranges: Fasting glucose 65-99 mg/dL    Expected Outcomes Short Term: Participant verbalizes understanding of the signs/symptoms and immediate care of hyper/hypoglycemia, proper foot care and importance of medication, aerobic/resistive exercise and nutrition plan for blood glucose control.;Long Term: Attainment of HbA1C < 7%.    Stress Yes    Intervention Refer participants experiencing significant psychosocial distress to appropriate mental health specialists for further evaluation and treatment. When possible, include family members and significant others in education/counseling sessions.;Offer individual and/or small group education and counseling on adjustment to heart disease, stress management and health-related lifestyle change. Teach and support self-help strategies.    Expected Outcomes Short Term: Participant demonstrates changes in health-related behavior, relaxation and other stress management skills, ability to obtain effective social support, and compliance with psychotropic medications if prescribed.;Long Term: Emotional wellbeing is indicated by absence of clinically significant psychosocial distress or social isolation.            Core Components/Risk Factors/Patient Goals Review:   Goals and Risk Factor Review    Row Name 08/19/20 1513 08/19/20 1634 09/07/20 1455 10/07/20 1702       Core Components/Risk Factors/Patient Goals Review   Personal Goals Review Weight Management/Obesity;Improve shortness of breath with ADL's;Develop more efficient breathing techniques such as purse lipped breathing and diaphragmatic breathing and practicing self-pacing with activity.;Increase knowledge of respiratory medications and ability to use respiratory devices properly.;Stress;Diabetes -- Weight Management/Obesity;Improve shortness of breath with ADL's;Develop more efficient breathing techniques such as purse lipped breathing and diaphragmatic breathing and practicing self-pacing with activity.;Increase knowledge of respiratory medications and ability to use respiratory devices properly.;Stress;Diabetes Weight Management/Obesity;Improve shortness of breath with ADL's;Develop more efficient breathing techniques such as purse lipped breathing and diaphragmatic breathing and practicing self-pacing with activity.;Increase knowledge of respiratory medications and ability to use respiratory devices properly.;Stress;Diabetes    Review Pt has completed 1 exercise session.  Will continue to monitor pt ability and technique wit PLB. Barrier to this is chronic sinusitis and congestion make breathing through his nose difficult.  Will need to concentrate on moving pt toward independent diaphragmatic breathing. Will have Ailene Ravel RD to establish plan for  pt desired weight loss and diabetes management.  Pt takes both oral agents and high dose insulin with labile CBG readings  as a result of not eating breakfast consistently. Daishon has completed 1 exercise session.  Will continue to monitor pt ability and technique wit PLB. Barrier to this is chronic sinusitis and congestion make breathing through his nose difficult.  Will need to concentrate on  moving pt toward independent diaphragmatic breathing. Will have Ailene Ravel RD to establish plan for pt desired weight loss and diabetes management.  Pt takes both oral agents and high dose insulin with labile CBG readings  as a result of not eating breakfast consistently. Colten has completed 5 exercise sessions.  Will continue to monitor pt ability and technique with PLB.  Pt seen by primary MD for sinsusitis, to use Muccinex which pt feels has helped.  Will need to concentrate on moving pt toward independent diaphragmatic breathing.  Pt takes both oral agents and high dose insulin with labile CBG readings  as a result of not eating breakfast consistently. Pt seen by his endocrinlologist at the [place] no changes made.  Acknowledged that his blood sugar readings are labile. Weight elevated 2 kg from begining weight.  Pt does not engage in consistent home exercise. Hughes has completed 11 exercise sessions.  Will continue to monitor pt ability and technique with PLB. Pt does not believe this is helpful for him since he has sinus congestion. Will need to concentrate on moving pt toward independent diaphragmatic breathing although this is also difficult for him due to his large abdominal area..  Pt takes both oral agents and high dose insulin with labile CBG readings  as a result of not eating breakfast consistently. pt has CBG attached and his readings are up and down throughout the day.  [facility] he is going to see Dr. Cruzita Lederer next week as a new consult.  Hopeful he can achieve appropriate managment of his diabetes.   Weight decreased 2 kg from begining weight.  Pt does not engage in consistent home exercise partly becasue of his shortness of breath although his O2 saturations are always normal. Randolf has inceased his workloads her at pulmonary rehab to level 6 on the nustep with average 2.2 MET.  Reminders to keep his steps up to continue on the higher level.    Expected Outcomes  See Admission Goals -- See Admission Goals See Admission Goals           Core Components/Risk Factors/Patient Goals at Discharge (Final Review):   Goals and Risk Factor Review - 10/07/20 1702      Core Components/Risk Factors/Patient Goals Review   Personal Goals Review Weight Management/Obesity;Improve shortness of breath with ADL's;Develop more efficient breathing techniques such as purse lipped breathing and diaphragmatic breathing and practicing self-pacing with activity.;Increase knowledge of respiratory medications and ability to use respiratory devices properly.;Stress;Diabetes    Review Kazi has completed 11 exercise sessions.  Will continue to monitor pt ability and technique with PLB. Pt does not believe this is helpful for him since he has sinus congestion. Will need to concentrate on moving pt toward independent diaphragmatic breathing although this is also difficult for him due to his large abdominal area..  Pt takes both oral agents and high dose insulin with labile CBG readings  as a result of not eating breakfast consistently. pt has CBG attached and his readings are up and down throughout the day.  [facility] he is going to see Dr. Cruzita Lederer next week as a new consult.  Hopeful he can achieve appropriate managment of his diabetes.   Weight decreased 2 kg from begining weight.  Pt does not engage in consistent home exercise partly becasue of his shortness of breath although his O2 saturations are always normal. Mikel has inceased his workloads her at pulmonary rehab to level 6 on the nustep with average 2.2 MET.  Reminders to keep his steps up to continue on the higher level.    Expected Outcomes See Admission Goals           ITP Comments:   Comments:  Zubin has completed 12 exercise session in Pulmonary rehab. Pt maintains good attendance and consistent home exercise. Pulmonary rehab staff will  continue to monitor and reassess progress  toward goals during her participation in Pulmonary Rehab. Cherre Huger, BSN Cardiac and Training and development officer

## 2020-10-09 DIAGNOSIS — D2371 Other benign neoplasm of skin of right lower limb, including hip: Secondary | ICD-10-CM | POA: Diagnosis not present

## 2020-10-09 DIAGNOSIS — D485 Neoplasm of uncertain behavior of skin: Secondary | ICD-10-CM | POA: Diagnosis not present

## 2020-10-09 DIAGNOSIS — D2271 Melanocytic nevi of right lower limb, including hip: Secondary | ICD-10-CM | POA: Diagnosis not present

## 2020-10-09 DIAGNOSIS — L570 Actinic keratosis: Secondary | ICD-10-CM | POA: Diagnosis not present

## 2020-10-09 DIAGNOSIS — D225 Melanocytic nevi of trunk: Secondary | ICD-10-CM | POA: Diagnosis not present

## 2020-10-09 DIAGNOSIS — C44329 Squamous cell carcinoma of skin of other parts of face: Secondary | ICD-10-CM | POA: Diagnosis not present

## 2020-10-09 DIAGNOSIS — L814 Other melanin hyperpigmentation: Secondary | ICD-10-CM | POA: Diagnosis not present

## 2020-10-09 DIAGNOSIS — Z85828 Personal history of other malignant neoplasm of skin: Secondary | ICD-10-CM | POA: Diagnosis not present

## 2020-10-09 DIAGNOSIS — L821 Other seborrheic keratosis: Secondary | ICD-10-CM | POA: Diagnosis not present

## 2020-10-12 ENCOUNTER — Telehealth (HOSPITAL_COMMUNITY): Payer: Self-pay | Admitting: *Deleted

## 2020-10-12 ENCOUNTER — Telehealth (HOSPITAL_COMMUNITY): Payer: Self-pay | Admitting: Family Medicine

## 2020-10-12 NOTE — Telephone Encounter (Signed)
Returned call to patient, LM that his last day can be changed to 10-20-2020 and he will do his graduation 6 minute walk test that day.  Call back if this does not work for him.

## 2020-10-13 ENCOUNTER — Encounter (HOSPITAL_COMMUNITY): Payer: Medicare HMO

## 2020-10-15 ENCOUNTER — Encounter (HOSPITAL_COMMUNITY): Payer: Medicare HMO

## 2020-10-19 ENCOUNTER — Telehealth: Payer: Medicare HMO

## 2020-10-20 ENCOUNTER — Telehealth (HOSPITAL_COMMUNITY): Payer: Self-pay | Admitting: Family Medicine

## 2020-10-20 ENCOUNTER — Telehealth (HOSPITAL_COMMUNITY): Payer: Self-pay | Admitting: *Deleted

## 2020-10-20 ENCOUNTER — Ambulatory Visit (HOSPITAL_COMMUNITY): Payer: Medicare HMO

## 2020-10-20 NOTE — Progress Notes (Addendum)
Pt was scheduled for post 6 minute walk test 10/20/20 and completion of program. Pt did not show. He will be discharged as of 10/20/20. Nothing further needed.   Rick Duff MS, ACSM CEP

## 2020-10-22 ENCOUNTER — Encounter (HOSPITAL_COMMUNITY)
Admission: RE | Admit: 2020-10-22 | Discharge: 2020-10-22 | Disposition: A | Discharge status: Home / Self Care | Payer: Medicare HMO | Source: Ambulatory Visit | Attending: Emergency Medicine | Admitting: Emergency Medicine

## 2020-10-22 ENCOUNTER — Other Ambulatory Visit: Payer: Self-pay

## 2020-10-22 DIAGNOSIS — J449 Chronic obstructive pulmonary disease, unspecified: Secondary | ICD-10-CM | POA: Insufficient documentation

## 2020-10-22 NOTE — Progress Notes (Signed)
Daily Session Note  Patient Details  Name: Sean Saunders MRN: [id number] Date of Birth: Apr 01, 1946 Referring Provider:     Pulmonary Rehab Walk Test from 08/11/2020 in [facility]  Referring Provider Dr. Lamonte Sakai      Encounter Date: 10/22/2020  Check In:  Session Check In - 10/22/20 1114      Check-In   Supervising physician immediately available to respond to emergencies [facility] immediately available    Physician(s) Dr. Wynelle Cleveland    Location [facility]    Staff Present Rosebud Poles, RN, BSN;Lisa Ysidro Evert, RN;Alexza Norbeck Hassell Done, MS, ACSM-CEP, Exercise Physiologist    Virtual Visit No    Medication changes reported     No    Fall or balance concerns reported    No    Tobacco Cessation No Change    Warm-up and Cool-down Not performed (comment)    Resistance Training Performed No    VAD Patient? No    PAD/SET Patient? No      Pain Assessment   Currently in Pain? No/denies    Multiple Pain Sites No           Capillary Blood Glucose: No results found for this or any previous visit (from the past 24 hour(s)).    Social History   Tobacco Use  Smoking Status Former Smoker  . Packs/day: 1.00  . Years: 25.00  . Pack years: 25.00  . Types: Cigarettes  . Quit date: 06/05/2018  . Years since quitting: 2.3  Smokeless Tobacco Never Used    Goals Met:  Proper associated with RPD/PD & O2 Sat Exercise tolerated well No report of cardiac concerns or symptoms  Goals Unmet:  Not Applicable  Comments: Service time is from 1015 to 1100 Post 6 minute walk test completed today. Pt completed pulmonary rehab   Dr. Fransico Him is Medical Director for Cardiac Rehab at [facility].

## 2020-11-02 ENCOUNTER — Ambulatory Visit (INDEPENDENT_AMBULATORY_CARE_PROVIDER_SITE_OTHER): Payer: Medicare HMO | Admitting: Emergency Medicine

## 2020-11-02 ENCOUNTER — Other Ambulatory Visit: Payer: Self-pay

## 2020-11-02 ENCOUNTER — Encounter: Payer: Self-pay | Admitting: Emergency Medicine

## 2020-11-02 DIAGNOSIS — J309 Allergic rhinitis, unspecified: Secondary | ICD-10-CM | POA: Diagnosis not present

## 2020-11-02 DIAGNOSIS — J449 Chronic obstructive pulmonary disease, unspecified: Secondary | ICD-10-CM | POA: Diagnosis not present

## 2020-11-02 DIAGNOSIS — J9611 Chronic respiratory failure with hypoxia: Secondary | ICD-10-CM | POA: Diagnosis not present

## 2020-11-02 NOTE — Patient Instructions (Signed)
Continue Trelegy once a day (not twice a day).  Rinse and gargle.  Mouth and throat after using. You can use your ipratropium/albuterol (DuoNeb) nebulizer treatments up to every 6 hours if you need them for shortness of breath or wheezing. Keep Combivent available to use 2 puffs up to every 6 hours if needed continue Zyrtec once daily Use fluticasone (Flonase) nasal spray 2 sprays each nostril twice a day. Try starting nasal saline rinses to see if this helps clear your sinuses, decrease the mucus that impacts your throat each day Depending on how your mucus, cough or doing we may decide to send you back to see ENT.  We may also decide to send you for for swallow evaluation Follow with Dr Lamonte Sakai in 6 months or sooner if you have any problems

## 2020-11-02 NOTE — Assessment & Plan Note (Signed)
He doesn't desaturate with cardiopulmonary rehab but has seen desaturations at home with even light activity.  Unclear to me that were getting good data at home.  He has not been qualified for oxygen

## 2020-11-02 NOTE — Assessment & Plan Note (Signed)
He is overtreating, using Trelegy sometimes twice a day although unclear whether he is getting a clinical benefit.  It may be that he is delivering poorly and is getting better deposition of his DuoNeb.  Consider changing to a primarily nebulized regimen at some point in the future.  For now I'm going to try and continue his usual regimen, treat his upper airway secretions as able.

## 2020-11-02 NOTE — Assessment & Plan Note (Addendum)
With significant upper airway noise, sounds like secretions but he has difficulty clearing.  We'll try to ramp up his rhinitis regimen is much as possible.  He uses Mucinex to loosen secretions.  Question whether lisinopril is a player but he does not have stridor or dry cough.  No purulence, nothing to suggest chronic infectious process.  Question whether there is an aspiration component  continue Zyrtec once daily Use fluticasone (Flonase) nasal spray 2 sprays each nostril twice a day. Try starting nasal saline rinses to see if this helps clear your sinuses, decrease the mucus that impacts your throat each day Depending on how your mucus, cough or doing we may decide to send you back to see ENT.  We may also decide to send you for for swallow evaluation Follow with Dr Lamonte Sakai in 6 months or sooner if you have any problem

## 2020-11-02 NOTE — Progress Notes (Signed)
Subjective:    Patient ID: Sean Saunders, male    DOB: 26-Jan-1946, 74 y.o.   MRN: [id number]  HPI  ROV 09/03/2019 --follow-up visit for 74 year old man with a history of tobacco and severe obstructive lung disease.  He returns today reporting that he does most of the shopping, the household chores. He has to stop after 50 yrds to walk the dog. He remains on Trelegy. He has been dealing with a chronic sinusitis - started Augmentin 3 days ago. Prior to that he was treated with another abx and steroids, completed 6weeks ago. Headache, ear fullness.  Has uses albuterol nebs - unclear that it has helped his breathing he has heard some wheeze. He has not restarted smoking. On flonase, atrovent NS.  Unfortunately his wife has been very ill with metastatic lung CA.   ROV 08/31/20 --74 year old man with a history of severe obstructive lung disease based on his PFT, allergic rhinitis.  He had acute flare of his COPD in July 2021 and was hospitalized.  He has been doing cardiopulmonary rehab, has not had any formal desats on RA but he has seen Spo2 in the 70's on his own oximeter. He was on Trelegy at our last visit. After the hospitalization he was changed to [place], but he is only using one at a time. He has a lot of throat mucous, unsure color. Stable SOB. He is on zyrtec and flonase consistently  ROV 11/02/2020 --74 year old gentleman with severe obstructive lung disease and allergic rhinitis, suspected exertional hypoxemia but no documented desaturations during cardiopulmonary rehab.  The desaturations in question happened when he was having flaring symptoms in October, was treated for an acute exacerbation with levofloxacin, prednisone taper.  Currently managed on Trelegy - he has been been using bid a lot instead. He isn't sure that it helps him very much. He has duoneb and uses a few times a day.  He has combivent, uses 0-1x a day.  Uses flutter valve approximately.  He has albuterol. Uses zyrtec  and flonase 2-3x a day. He has a lot of throat phlegm especially in the am. Remains on lisinopril. No longer sees ENT Dr Anthony Sar, has had sinus sgy before.    Review of Systems  Constitutional: Negative for fever and unexpected weight change.  HENT: Negative for congestion, dental problem, ear pain, nosebleeds, postnasal drip, rhinorrhea, sinus pressure, sneezing, sore throat and trouble swallowing.   Eyes: Negative for redness and itching.  Respiratory: Positive for cough, chest tightness, shortness of breath and wheezing.   Cardiovascular: Negative for palpitations and leg swelling.  Gastrointestinal: Negative for nausea and vomiting.  Genitourinary: Negative for dysuria.  Musculoskeletal: Negative for joint swelling.  Skin: Negative for rash.  Neurological: Negative for headaches.  Hematological: Does not bruise/bleed easily.  Psychiatric/Behavioral: Negative for dysphoric mood. The patient is not nervous/anxious.        Objective:   Physical Exam Vitals:   11/02/20 0911  BP: 128/72  Pulse: 95  Temp: 97.9 F (36.6 C)  SpO2: 97%  Weight: 229 lb (103.9 kg)  Height: 6' (1.829 m)   Gen: Pleasant, obese man, in no distress,  normal affect  ENT: No lesions,  UA noise / secretions on expiration.   Neck: No JVD, loud upper airway noise, secretions esp on expiration  Lungs: No use of accessory muscles, distant, distant but no wheezing, just referred upper airway noise  Cardiovascular: RRR, heart sounds normal, no murmur or gallops, no peripheral edema  Musculoskeletal:  No deformities, no cyanosis or clubbing  Neuro: alert, non focal  Skin: Warm, no lesions or rash      Assessment & Plan:  Rhinitis, allergic With significant upper airway noise, sounds like secretions but he has difficulty clearing.  We'll try to ramp up his rhinitis regimen is much as possible.  He uses Mucinex to loosen secretions.  Question whether lisinopril is a player but he does not have stridor or dry  cough.  No purulence, nothing to suggest chronic infectious process.  Question whether there is an aspiration component  continue Zyrtec once daily Use fluticasone (Flonase) nasal spray 2 sprays each nostril twice a day. Try starting nasal saline rinses to see if this helps clear your sinuses, decrease the mucus that impacts your throat each day Depending on how your mucus, cough or doing we may decide to send you back to see ENT.  We may also decide to send you for for swallow evaluation Follow with Dr Lamonte Sakai in 6 months or sooner if you have any problem  COPD (chronic obstructive pulmonary disease) ([place]) He is overtreating, using Trelegy sometimes twice a day although unclear whether he is getting a clinical benefit.  It may be that he is delivering poorly and is getting better deposition of his DuoNeb.  Consider changing to a primarily nebulized regimen at some point in the future.  For now I'm going to try and continue his usual regimen, treat his upper airway secretions as able.  Chronic respiratory failure with hypoxia (HCC) He doesn't desaturate with cardiopulmonary rehab but has seen desaturations at home with even light activity.  Unclear to me that were getting good data at home.  He has not been qualified for oxygen  Baltazar Apo, MD, PhD 11/02/2020, 9:46 AM [facility] [phone] or if no answer [phone]

## 2020-11-03 ENCOUNTER — Telehealth: Payer: Self-pay | Admitting: Pharmacist

## 2020-11-03 ENCOUNTER — Telehealth: Payer: Medicare HMO

## 2020-11-03 NOTE — Chronic Care Management (AMB) (Signed)
I left the patient a message about his upcoming appointment on 11-04-2020 @ 8:00 am with the clinical pharmacist. He was asked to please have all medication on hand to review the pharmacist.   Neita Goodnight) Mare Ferrari, [place] Assistant [phone]

## 2020-11-04 ENCOUNTER — Telehealth: Payer: Medicare HMO

## 2020-11-11 DIAGNOSIS — L603 Nail dystrophy: Secondary | ICD-10-CM | POA: Diagnosis not present

## 2020-11-11 DIAGNOSIS — I739 Peripheral vascular disease, unspecified: Secondary | ICD-10-CM | POA: Diagnosis not present

## 2020-11-11 DIAGNOSIS — E1151 Type 2 diabetes mellitus with diabetic peripheral angiopathy without gangrene: Secondary | ICD-10-CM | POA: Diagnosis not present

## 2020-11-11 DIAGNOSIS — L84 Corns and callosities: Secondary | ICD-10-CM | POA: Diagnosis not present

## 2020-11-19 NOTE — Progress Notes (Signed)
Discharge Progress Report  Patient Details  Name: Sean Saunders MRN: [id number] Date of Birth: 05-24-1946 Referring Provider:   [facility] Walk Test from 08/11/2020 in [place]  Referring Provider Dr. Lamonte Sakai       Number of Visits: 13  Reason for Discharge:  Patient reached a stable level of exercise. Patient independent in their exercise. Patient has met program and personal goals.  Smoking History:  Social History   Tobacco Use  Smoking Status Former Smoker  . Packs/day: 1.00  . Years: 25.00  . Pack years: 25.00  . Types: Cigarettes  . Quit date: 06/05/2018  . Years since quitting: 2.4  Smokeless Tobacco Never Used    Diagnosis:  Chronic obstructive pulmonary disease, unspecified COPD type ([place])  ADL [facility]:  Pulmonary Assessment Scores    Row Name 08/11/20 1157 08/11/20 1543 10/22/20 1518     ADL [facility]   ADL Phase -- -- Exit   SOB Score total 58 -- 66     CAT Score   CAT Score 21 -- 19     mMRC Score   mMRC Score -- 4 4          Initial Exercise Prescription:  Initial Exercise Prescription - 08/11/20 1500      Date of Initial Exercise RX and Referring Provider   Date 08/11/20    Referring Provider Dr. Lamonte Sakai    Expected Discharge Date 10/08/20      NuStep   Level 2    SPM 80    Minutes 30    METs 1.6      Prescription Details   Frequency (times per week) 2    Duration Progress to 30 minutes of continuous aerobic without signs/symptoms of physical distress      Intensity   THRR 40-80% of Max Heartrate 58-116    Ratings of Perceived Exertion 11-13    Perceived Dyspnea 0-4      Progression   Progression Continue to progress workloads to maintain intensity without signs/symptoms of physical distress.      Resistance Training   Training Prescription Yes    Weight blue bands    Reps 10-15           Discharge Exercise Prescription (Final Exercise Prescription Changes):  Exercise  Prescription Changes - 10/08/20 1149      Response to Exercise   Blood Pressure (Admit) 130/66    Blood Pressure (Exercise) 120/60    Blood Pressure (Exit) 120/60    Heart Rate (Admit) 98 bpm    Heart Rate (Exercise) 98 bpm    Heart Rate (Exit) 97 bpm    Oxygen Saturation (Admit) 99 %    Oxygen Saturation (Exercise) 98 %    Oxygen Saturation (Exit) 98 %    Rating of Perceived Exertion (Exercise) 15    Perceived Dyspnea (Exercise) 3    Duration Progress to 30 minutes of  aerobic without signs/symptoms of physical distress    Intensity THRR unchanged      Progression   Progression Continue to progress workloads to maintain intensity without signs/symptoms of physical distress.      Resistance Training   Training Prescription Yes    Weight blue bands    Reps 10-15    Time 10 Minutes      NuStep   Level 6    SPM 80    Minutes 30    METs 2.3  Functional Capacity:  6 Minute Walk    Row Name 08/11/20 1543 10/22/20 1207       6 Minute Walk   Phase -- Discharge    Distance 681 feet 800 feet    Distance % Change -- 17.47 %    Distance Feet Change -- 119 ft    Walk Time 6 minutes 6 minutes    # of Rest Breaks 1  pt had to stop at minute 4 due to severe shortness of breath and feeling light headed 1    MPH 1.29 1.51    METS 1.59 2.16    RPE 12 14    Perceived Dyspnea  3 3    VO2 Peak 5.56 7.58    Symptoms Yes (comment) Yes (comment)    Comments pt started becoming wobbly and unstable around the 4th minute of the walk test. Had him sit down at that time to check vitals and asses how he was feeling. Pt stated he was very short of breath and finally admitted to being somewhat light headed. we monitored his vitals and everything was normal. pt stopped walk test at 4 minutes. Pt used front wheel walker for 6 minute walk test and was still very wobly. Had to walk behind pt holding on to him and give him cues to keep the walker close to him. He also complained of SOB.     Resting HR 81 bpm 93 bpm    Resting BP 126/62 160/62    Resting Oxygen Saturation  97 % 97 %    Exercise Oxygen Saturation  during 6 min walk 96 % 97 %    Max Ex. HR 97 bpm 113 bpm    Max Ex. BP 164/54 180/64    2 Minute Post BP 132/58 166/60         Interval HR   1 Minute HR 91 104    2 Minute HR 96 107    3 Minute HR 97 112    4 Minute HR 96 113    5 Minute HR 88 112    6 Minute HR 88 114    2 Minute Post HR 84 95    Interval Heart Rate? Yes Yes         Interval Oxygen   Interval Oxygen? Yes Yes    Baseline Oxygen Saturation % 97 % 97 %    1 Minute Oxygen Saturation % 96 % 97 %    1 Minute Liters of Oxygen 0 L 0 L    2 Minute Oxygen Saturation % 97 % 97 %    2 Minute Liters of Oxygen 0 L 0 L    3 Minute Oxygen Saturation % 97 % 97 %    3 Minute Liters of Oxygen 0 L 0 L    4 Minute Oxygen Saturation % 98 % 97 %    4 Minute Liters of Oxygen 0 L 0 L    5 Minute Oxygen Saturation % 98 % 97 %    5 Minute Liters of Oxygen 0 L 0 L    6 Minute Oxygen Saturation % 98 % 97 %    6 Minute Liters of Oxygen 0 L 0 L    2 Minute Post Oxygen Saturation % 98 % 97 %    2 Minute Post Liters of Oxygen 0 L 0 L           Psychological, QOL, Others - Outcomes: PHQ 2/9: Depression screen [facility] 2/9 10/22/2020  08/25/2020 08/11/2020 09/17/2018  Decreased Interest 1 0 3 0  Down, Depressed, Hopeless 0 0 2 0  PHQ - 2 Score 1 0 5 0  Altered sleeping 3 - 0 -  Tired, decreased energy 1 - 3 -  Change in appetite 1 - 3 -  Feeling bad or failure about yourself  0 - 0 -  Trouble concentrating 0 - 0 -  Moving slowly or fidgety/restless 0 - 0 -  Suicidal thoughts 0 - 0 -  PHQ-9 Score 6 - 11 -  Difficult doing work/chores Somewhat difficult - Not difficult at all -    Quality of Life:   Personal Goals: Goals established at orientation with interventions provided to work toward goal.  Personal Goals and Risk Factors at Admission - 08/11/20 0926      Core Components/Risk Factors/Patient Goals on  Admission    Weight Management Weight Loss    Goal Weight: Long Term 200 lb (90.7 kg)    Improve shortness of breath with ADL's Yes    Intervention Provide education, individualized exercise plan and daily activity instruction to help decrease symptoms of SOB with activities of daily living.    Expected Outcomes Short Term: Improve cardiorespiratory fitness to achieve a reduction of symptoms when performing ADLs;Long Term: Be able to perform more ADLs without symptoms or delay the onset of symptoms    Diabetes Yes    Intervention Provide education about signs/symptoms and action to take for hypo/hyperglycemia.;Provide education about proper nutrition, including hydration, and aerobic/resistive exercise prescription along with prescribed medications to achieve blood glucose in normal ranges: Fasting glucose 65-99 mg/dL    Expected Outcomes Short Term: Participant verbalizes understanding of the signs/symptoms and immediate care of hyper/hypoglycemia, proper foot care and importance of medication, aerobic/resistive exercise and nutrition plan for blood glucose control.;Long Term: Attainment of HbA1C < 7%.    Stress Yes    Intervention Refer participants experiencing significant psychosocial distress to appropriate mental health specialists for further evaluation and treatment. When possible, include family members and significant others in education/counseling sessions.;Offer individual and/or small group education and counseling on adjustment to heart disease, stress management and health-related lifestyle change. Teach and support self-help strategies.    Expected Outcomes Short Term: Participant demonstrates changes in health-related behavior, relaxation and other stress management skills, ability to obtain effective social support, and compliance with psychotropic medications if prescribed.;Long Term: Emotional wellbeing is indicated by absence of clinically significant psychosocial distress or social  isolation.            Personal Goals Discharge:  Goals and Risk Factor Review    Row Name 08/19/20 1513 08/19/20 1634 09/07/20 1455 10/07/20 1702       Core Components/Risk Factors/Patient Goals Review   Personal Goals Review Weight Management/Obesity;Improve shortness of breath with ADL's;Develop more efficient breathing techniques such as purse lipped breathing and diaphragmatic breathing and practicing self-pacing with activity.;Increase knowledge of respiratory medications and ability to use respiratory devices properly.;Stress;Diabetes -- Weight Management/Obesity;Improve shortness of breath with ADL's;Develop more efficient breathing techniques such as purse lipped breathing and diaphragmatic breathing and practicing self-pacing with activity.;Increase knowledge of respiratory medications and ability to use respiratory devices properly.;Stress;Diabetes Weight Management/Obesity;Improve shortness of breath with ADL's;Develop more efficient breathing techniques such as purse lipped breathing and diaphragmatic breathing and practicing self-pacing with activity.;Increase knowledge of respiratory medications and ability to use respiratory devices properly.;Stress;Diabetes    Review Pt has completed 1 exercise session.  Will continue to monitor pt ability and technique wit  PLB. Barrier to this is chronic sinusitis and congestion make breathing through his nose difficult.  Will need to concentrate on moving pt toward independent diaphragmatic breathing. Will have Sean Saunders RD to establish plan for pt desired weight loss and diabetes management.  Pt takes both oral agents and high dose insulin with labile CBG readings  as a result of not eating breakfast consistently. Sean Saunders has completed 1 exercise session.  Will continue to monitor pt ability and technique wit PLB. Barrier to this is chronic sinusitis and congestion make breathing through his nose difficult.  Will need to concentrate on moving pt toward  independent diaphragmatic breathing. Will have Sean Saunders RD to establish plan for pt desired weight loss and diabetes management.  Pt takes both oral agents and high dose insulin with labile CBG readings  as a result of not eating breakfast consistently. Sean Saunders has completed 5 exercise sessions.  Will continue to monitor pt ability and technique with PLB.  Pt seen by primary MD for sinsusitis, to use Muccinex which pt feels has helped.  Will need to concentrate on moving pt toward independent diaphragmatic breathing.  Pt takes both oral agents and high dose insulin with labile CBG readings  as a result of not eating breakfast consistently. Pt seen by his endocrinlologist at the [place] no changes made.  Acknowledged that his blood sugar readings are labile. Weight elevated 2 kg from begining weight.  Pt does not engage in consistent home exercise. Sean Saunders has completed 11 exercise sessions.  Will continue to monitor pt ability and technique with PLB. Pt does not believe this is helpful for him since he has sinus congestion. Will need to concentrate on moving pt toward independent diaphragmatic breathing although this is also difficult for him due to his large abdominal area..  Pt takes both oral agents and high dose insulin with labile CBG readings  as a result of not eating breakfast consistently. pt has CBG attached and his readings are up and down throughout the day.  [facility] he is going to see Dr. Cruzita Lederer next week as a new consult.  Hopeful he can achieve appropriate managment of his diabetes.   Weight decreased 2 kg from begining weight.  Pt does not engage in consistent home exercise partly becasue of his shortness of breath although his O2 saturations are always normal. Sean Saunders has inceased his workloads her at pulmonary rehab to level 6 on the nustep with average 2.2 MET.  Reminders to keep his steps up to continue on the higher level.    Expected Outcomes See Admission  Goals -- See Admission Goals See Admission Goals           Exercise Goals and Review:  Exercise Goals    Row Name 08/11/20 1550 08/20/20 1502           Exercise Goals   Increase Physical Activity Yes Yes      Intervention Provide advice, education, support and counseling about physical activity/exercise needs.;Develop an individualized exercise prescription for aerobic and resistive training based on initial evaluation findings, risk stratification, comorbidities and participant's personal goals. Provide advice, education, support and counseling about physical activity/exercise needs.;Develop an individualized exercise prescription for aerobic and resistive training based on initial evaluation findings, risk stratification, comorbidities and participant's personal goals.      Expected Outcomes Short Term: Attend rehab on a regular basis to increase amount of physical activity.;Long Term: Add in home exercise to make exercise part of routine  and to increase amount of physical activity.;Long Term: Exercising regularly at least 3-5 days a week. Short Term: Attend rehab on a regular basis to increase amount of physical activity.;Long Term: Add in home exercise to make exercise part of routine and to increase amount of physical activity.;Long Term: Exercising regularly at least 3-5 days a week.      Increase Strength and Stamina Yes Yes      Intervention Provide advice, education, support and counseling about physical activity/exercise needs.;Develop an individualized exercise prescription for aerobic and resistive training based on initial evaluation findings, risk stratification, comorbidities and participant's personal goals. Provide advice, education, support and counseling about physical activity/exercise needs.;Develop an individualized exercise prescription for aerobic and resistive training based on initial evaluation findings, risk stratification, comorbidities and participant's personal goals.       Expected Outcomes Short Term: Increase workloads from initial exercise prescription for resistance, speed, and METs.;Short Term: Perform resistance training exercises routinely during rehab and add in resistance training at home;Long Term: Improve cardiorespiratory fitness, muscular endurance and strength as measured by increased METs and functional capacity (6MWT) Short Term: Increase workloads from initial exercise prescription for resistance, speed, and METs.;Short Term: Perform resistance training exercises routinely during rehab and add in resistance training at home;Long Term: Improve cardiorespiratory fitness, muscular endurance and strength as measured by increased METs and functional capacity (6MWT)      Able to understand and use rate of perceived exertion (RPE) scale Yes Yes      Intervention Provide education and explanation on how to use RPE scale Provide education and explanation on how to use RPE scale      Expected Outcomes Short Term: Able to use RPE daily in rehab to express subjective intensity level;Long Term:  Able to use RPE to guide intensity level when exercising independently Short Term: Able to use RPE daily in rehab to express subjective intensity level;Long Term:  Able to use RPE to guide intensity level when exercising independently      Able to understand and use Dyspnea scale Yes Yes      Intervention Provide education and explanation on how to use Dyspnea scale Provide education and explanation on how to use Dyspnea scale      Expected Outcomes Short Term: Able to use Dyspnea scale daily in rehab to express subjective sense of shortness of breath during exertion;Long Term: Able to use Dyspnea scale to guide intensity level when exercising independently Short Term: Able to use Dyspnea scale daily in rehab to express subjective sense of shortness of breath during exertion;Long Term: Able to use Dyspnea scale to guide intensity level when exercising independently       Knowledge and understanding of Target Heart Rate Range (THRR) Yes Yes      Intervention Provide education and explanation of THRR including how the numbers were predicted and where they are located for reference Provide education and explanation of THRR including how the numbers were predicted and where they are located for reference      Expected Outcomes Short Term: Able to state/look up THRR;Long Term: Able to use THRR to govern intensity when exercising independently;Short Term: Able to use daily as guideline for intensity in rehab Short Term: Able to state/look up THRR;Long Term: Able to use THRR to govern intensity when exercising independently;Short Term: Able to use daily as guideline for intensity in rehab      Understanding of Exercise Prescription Yes Yes      Intervention Provide education, explanation, and written  materials on patient's individual exercise prescription Provide education, explanation, and written materials on patient's individual exercise prescription      Expected Outcomes Short Term: Able to explain program exercise prescription;Long Term: Able to explain home exercise prescription to exercise independently Short Term: Able to explain program exercise prescription;Long Term: Able to explain home exercise prescription to exercise independently             Exercise Goals Re-Evaluation:  Exercise Goals Re-Evaluation    Row Name 08/20/20 1503 08/20/20 1510 09/08/20 1508 10/06/20 1554       Exercise Goal Re-Evaluation   Exercise Goals Review Increase Physical Activity;Increase Strength and Stamina;Able to understand and use rate of perceived exertion (RPE) scale;Able to understand and use Dyspnea scale;Knowledge and understanding of Target Heart Rate Range (THRR);Able to check pulse independently;Understanding of Exercise Prescription;Improve claudication pain tolerance and improve walking ability -- Increase Physical Activity;Increase Strength and Stamina;Able to  understand and use rate of perceived exertion (RPE) scale;Able to understand and use Dyspnea scale;Knowledge and understanding of Target Heart Rate Range (THRR);Understanding of Exercise Prescription Increase Physical Activity;Increase Strength and Stamina;Able to understand and use rate of perceived exertion (RPE) scale;Able to understand and use Dyspnea scale;Knowledge and understanding of Target Heart Rate Range (THRR);Understanding of Exercise Prescription    Comments -- pt has completed 2 exercise sessions with rehab. He has tolerated exercise well so far and is exercising at 1.9 METs on the Nustep. Pt has completed 6 exercise sessions and has been slow to make progressions. He is exercising at 2.1 METS on the Nustep. We will continue to monitor and progress as he is able. Pt has completed 11 exercise sessions and has made some progressions with workload increases and MET increases. He is currently exercising at 2.2 METS on the Nustep. Pt stil c/o SOB with all activity. Will continue to monitor and progress as he is able.    Expected Outcomes Through exercise at rehab and home the patient will decrease shortness of breath with daily activities and feel confident in carrying out an exercise regimn at home -- Through exercise at rehab and home the patient will decrease shortness of breath with daily activities and feel confident in carrying out an exercise regimn at home Through exercise at rehab and home the patient will decrease shortness of breath with daily activities and feel confident in carrying out an exercise regimn at home           Nutrition & Weight - Outcomes:  Pre Biometrics - 08/11/20 1510      Pre Biometrics   Grip Strength 41 kg           Post Biometrics - 10/22/20 1516       Post  Biometrics   Grip Strength 41 kg           Nutrition:  Nutrition Therapy & Goals - 09/08/20 1323      Nutrition Therapy   Diet Carb modified      Personal Nutrition Goals   Personal  Goal #2 CBG concentrations in the normal range or as close to normal as is safely possible.      Intervention Plan   Intervention Prescribe, educate and counsel regarding individualized specific dietary modifications aiming towards targeted core components such as weight, hypertension, lipid management, diabetes, heart failure and other comorbidities.    Expected Outcomes Short Term Goal: A plan has been developed with personal nutrition goals set during dietitian appointment.;Long Term Goal: Adherence to prescribed nutrition plan.  Nutrition Discharge:  Nutrition Assessments - 11/19/20 0728      Rate Your Plate Scores   Post Score 64           Education Questionnaire Score:  Knowledge Questionnaire Score - 10/22/20 1517      Knowledge Questionnaire Score   Post Score 14/18           Goals reviewed with patient; copy given to patient.

## 2020-11-19 NOTE — Addendum Note (Signed)
Encounter addended by: Lance Morin, RN on: 11/19/2020 3:47 PM  Actions taken: Clinical Note Signed, Episode resolved

## 2020-11-19 NOTE — Addendum Note (Signed)
Encounter addended by: George Ina, RD on: 11/19/2020 7:31 AM  Actions taken: Flowsheet data copied forward, Flowsheet accepted

## 2021-01-11 ENCOUNTER — Other Ambulatory Visit: Payer: Self-pay | Admitting: Primary Care

## 2021-01-22 ENCOUNTER — Telehealth: Payer: Self-pay | Admitting: Emergency Medicine

## 2021-01-22 MED ORDER — AZITHROMYCIN 250 MG PO TABS: 250.0000 mg | ORAL_TABLET | ORAL | 0 refills | Status: DC

## 2021-01-22 MED ORDER — PREDNISONE 20 MG PO TABS: 20.0000 mg | ORAL_TABLET | Freq: Every day | ORAL | 0 refills | Status: DC

## 2021-01-22 NOTE — Telephone Encounter (Signed)
Call returned to patient, confirmed DOB. He reports he thinks he has a sinus infection that he has been fighting for 2 weeks that have gradually gotten worse. He reports pressure in his ears and eyes. He has been taking OTC sinus medications. He reports having chest congestion. Dry cough. Denies fevers. He has had his vaccinations and booster shots. Reports increased SOB. Denies chest pain. He reports using his nasal spray, combivent, and zyrtec as prescribed. He reports he has to deal with this a couple times a year. Requesting prednisone and antibiotic.He reports he has done everything Dr Lamonte Sakai has told him to do and he is not getting better. He has not been tested for covid. Denies that he has been around anyone with covid or sick symptoms.   TP please advise.   Thanks :)

## 2021-01-22 NOTE — Telephone Encounter (Signed)
Lm x1 for patient.  

## 2021-01-22 NOTE — Telephone Encounter (Signed)
Spoke with the pt and notified of response per Tammy He verbalized understanding and rxs sent to pharm  Pt stated would call [facility] to get his covid testing

## 2021-01-22 NOTE — Telephone Encounter (Signed)
Needs Covid 19 testing .   Can send Zpack #1 take as directed.  Prednisone 20mg  daily for 5 days . #5  Mucinex DM Twice daily  As needed  Cough   Cont on COPD regimen , albuterol As needed    Called patient but no answer , LMOMTCB .

## 2021-01-22 NOTE — Telephone Encounter (Signed)
Pt. States he is returning call Please advise [phone]

## 2021-01-27 ENCOUNTER — Encounter: Payer: Self-pay | Admitting: Neurology

## 2021-01-27 ENCOUNTER — Ambulatory Visit (INDEPENDENT_AMBULATORY_CARE_PROVIDER_SITE_OTHER): Payer: Medicare PPO | Admitting: Neurology

## 2021-01-27 VITALS — BP 180/82 | HR 97 | Ht 72.0 in | Wt 231.0 lb

## 2021-01-27 DIAGNOSIS — R251 Tremor, unspecified: Secondary | ICD-10-CM | POA: Diagnosis not present

## 2021-01-27 NOTE — Progress Notes (Signed)
Subjective:    Patient ID: Sean Saunders is a 75 y.o. male.  HPI     History:  Dear Legrand Como,   I saw your patient, Sean Saunders, upon your kind request in my neurologic clinic today for initial consultation of his tremors.  The patient is unaccompanied today.  As you know, Sean Saunders is a 75 year old right-handed gentleman with an underlying medical history of hypertension, hyperlipidemia, hypothyroidism, TIA, stroke, prior smoking, reflux disease, DVT, diabetes, COPD, arthritis, restless leg syndrome, and mild obesity, who reports an approximately 2 to 62-month history of hand tremors.  He feels that both hands are affected, he denies any changes in his medication regimen in the recent past.  Of note, he had been on sertraline in the past but has not been on it for years.  He has been on a nebulizer but uses it as needed, every once in 2 to 3 months he reports.  He also has an inhaler on his list of medications and reports not using it.  He had a checkup with his lung doctor, Dr. Lamonte Sakai in November.  I reviewed the note.  [facility] reviewed [facility] [place] [facility] [place], including office note from 10/13/2020.  He reported a hand tremor since late May 2021.  He denies a family history of tremors, he reports that he had a maternal uncle with Parkinson's disease.  He reports that the tremor is noticeable when he tries to hold something such as a glass or cup of coffee.  As far as caffeine, he drinks 2 cups of coffee and 2 servings of green tea per day on average, and generally speaking about 3 bottles of water per day.  He does not drink any alcohol.  He quit smoking some 3 years ago.  He has had sinus congestion and drainage.  He has not had any recent falls.  He does not sleep well.  He had a sleep study some 5 years ago and reports that he was not diagnosed with sleep apnea.  He has had worsening of his handwriting.  He takes an over-the-counter sleep aid at night and also ropinirole for  restless legs.  He takes 2 pills of the over-the-counter sleep aid and 10 mg of melatonin each night. The tremor as such is not bothersome to him for his day-to-day activities but it is at times socially embarrassing.  He lives alone, he has 3 cats in the household.  He is widowed 3 times.  He does endorse intermittent symptoms of depression but has not had any recent antidepressant medication.  He attributes his depressive symptoms to having stressors in life.  His Past Medical History Is Significant For: Past Medical History:  Diagnosis Date  . Arthritis   . COPD (chronic obstructive pulmonary disease) ([place])   . Diabetes mellitus   . DVT (deep venous thrombosis) ([place])   . GERD (gastroesophageal reflux disease)   . Hyperlipidemia   . Hypertension   . Hypothyroidism   . Onychomycosis 06/07/2013  . Stroke [facility])    TIA's 01/05/11-01/08/11-01/26/11  . TIA (transient ischemic attack) 08/03/2011   Left sided weakness. 3 episodes Feb-March    His Past Surgical History Is Significant For: Past Surgical History:  Procedure Laterality Date  . ABDOMINAL AORTAGRAM N/A 06/04/2012   Procedure: ABDOMINAL Maxcine Ham;  Surgeon: Angelia Mould, MD;  Location[facility] C[facility] LAB;  Service: Cardiovascular;  Laterality: N/A;  . APPENDECTOMY    . COLONOSCOPY    . FEMORAL-POPLITEAL  BYPASS GRAFT  07/05/2012   Procedure: BYPASS GRAFT FEMORAL-POPLITEAL ARTERY;  Surgeon: Angelia Mould, MD;  Location: [facility] OR;  Service: Vascular;  Laterality: Left;  Revision Left fem/pop w/ artery patch angioplasty utilizing left GSV graft and extenion to tib/per trunk w/right GSV and interop arteriogram  . JOINT REPLACEMENT  2011   -Hip  . NASAL SINUS SURGERY    . PR VEIN BYPASS GRAFT,AORTO-FEM-POP    . PR VEIN BYPASS GRAFT,AORTO-FEM-POP  08-01-11   (L) Fem-pop BP  . toe nail    . TONSILLECTOMY      His Family History Is Significant For: Family History  Problem Relation Age of Onset  . Stroke Mother   . Diabetes  Mother   . Other Father        alzheimers  . Diabetes Father   . Stroke Father   . Diabetes Sister     His Social History Is Significant For: Social History   Socioeconomic History  . Marital status: Widowed    Spouse name: Not on file  . Number of children: 1  . Years of education: Not on file  . Highest education level: Master's degree (e.g., MA, MS, MEng, MEd, MSW, MBA)  Occupational History  . Occupation: retired  Tobacco Use  . Smoking status: Former Smoker    Packs/day: 1.00    Years: 25.00    Pack years: 25.00    Types: Cigarettes    Quit date: 06/05/2018    Years since quitting: 2.6  . Smokeless tobacco: Never Used  Vaping Use  . Vaping Use: Never used  Substance and Sexual Activity  . Alcohol use: Not Currently    Alcohol/week: 0.0 standard drinks    Comment: rare  . Drug use: No  . Sexual activity: Not on file  Other Topics Concern  . Not on file  Social History Narrative  . Not on file   Social Determinants of Health   Financial Resource Strain: Low Risk   . Difficulty of Paying Living Expenses: Not hard at all  Food Insecurity: No Food Insecurity  . Worried About Charity fundraiser in the Last Year: Never true  . Ran Out of Food in the Last Year: Never true  Transportation Needs: No Transportation Needs  . Lack of Transportation (Medical): No  . Lack of Transportation (Non-Medical): No  Physical Activity: Inactive  . Days of Exercise per Week: 0 days  . Minutes of Exercise per Session: 0 min  Stress: No Stress Concern Present  . Feeling of Stress : Only a little  Social Connections: Moderately Isolated  . Frequency of Communication with Friends and Family: More than three times a week  . Frequency of Social Gatherings with Friends and Family: Twice a week  . Attends Religious Services: More than 4 times per year  . Active Member of Clubs or Organizations: No  . Attends Archivist Meetings: Never  . Marital Status: Widowed    His  Allergies Are:  Allergies  Allergen Reactions  . Eszopiclone Itching  . Niaspan [Niacin Er] Nausea And Vomiting and Rash  :   His Current Medications Are:  Outpatient Encounter Medications as of 01/27/2021  Medication Sig  . allopurinol (ZYLOPRIM) 100 MG tablet Take 100 mg by mouth 2 (two) times daily.  Marland Kitchen amLODipine (NORVASC) 10 MG tablet Take 10 mg by mouth daily.  . cetirizine (ZYRTEC) 10 MG tablet TAKE 1 TABLET(10 MG) BY MOUTH DAILY  . clopidogrel (PLAVIX) 75 MG tablet  Take 75 mg by mouth daily.  . fluticasone (FLONASE) 50 MCG/ACT nasal spray Place 2 sprays into both nostrils daily.  . furosemide (LASIX) 20 MG tablet Take 20 mg by mouth daily.  . hydrALAZINE (APRESOLINE) 50 MG tablet Take 50 mg by mouth every morning.  . hydrochlorothiazide (HYDRODIURIL) 25 MG tablet Take 25 mg by mouth daily.  . insulin regular human CONCENTRATED (HUMULIN R) 500 UNIT/ML injection Inject into the skin See admin instructions. 60 units at breakfast, 20 units at lunch and 30 at dinner  . irbesartan (AVAPRO) 150 MG tablet Take 1 tablet (150 mg total) by mouth daily.  Marland Kitchen levothyroxine (SYNTHROID, LEVOTHROID) 150 MCG tablet Take 150 mcg by mouth daily before breakfast.  . lisinopril (ZESTRIL) 40 MG tablet Take 40 mg by mouth daily.  . metFORMIN (GLUCOPHAGE) 500 MG tablet Take 500 mg by mouth 2 (two) times daily with a meal.  . Multiple Vitamin (MULTIVITAMIN WITH MINERALS) TABS tablet Take 1 tablet by mouth daily.  Marland Kitchen rOPINIRole (REQUIP) 0.25 MG tablet Take 0.25 mg by mouth at bedtime.  . rosuvastatin (CRESTOR) 40 MG tablet Take 20 mg by mouth daily.   . tamsulosin (FLOMAX) 0.4 MG CAPS capsule Take 0.4 mg by mouth.  Marland Kitchen azithromycin (ZITHROMAX) 250 MG tablet Take 1 tablet (250 mg total) by mouth as directed. (Patient not taking: Reported on 01/27/2021)  . [DISCONTINUED] COMBIVENT RESPIMAT 20-100 MCG/ACT AERS respimat Inhale 2 puffs into the lungs 3 (three) times daily.   . [DISCONTINUED]  Fluticasone-Umeclidin-Vilant (TRELEGY ELLIPTA) 100-62.5-25 MCG/INH AEPB Inhale 1 puff into the lungs daily.  . [DISCONTINUED] predniSONE (DELTASONE) 20 MG tablet Take 1 tablet (20 mg total) by mouth daily with breakfast.  . [DISCONTINUED] Pseudoephedrine-guaiFENesin (MUCINEX D MAX STRENGTH PO) Take 2 tablets by mouth every 4 (four) hours.  . [DISCONTINUED] sertraline (ZOLOFT) 25 MG tablet Take 1 tablet (25 mg total) by mouth daily.  . [DISCONTINUED] sodium chloride HYPERTONIC 3 % nebulizer solution Take by nebulization 2 (two) times daily.   No facility-administered encounter medications on file as of 01/27/2021.  :  Review of Systems:  Out of a complete 14 point review of systems, all are reviewed and negative with the exception of these symptoms as listed below: Review of Systems  Neurological:       Here to discuss worsening hand tremors. Reports sx started about 2 month ago. Report right hand is a little worse than the left. He is right handed.      Objective:  Neurological Exam  Physical Exam Physical Examination:   Vitals:   01/27/21 1014  BP: (!) 180/82  Pulse: 97  SpO2: 97%   General Examination: The patient is a very pleasant 75 y.o. male in no acute distress. He appears well-developed and well-nourished and well groomed.   HEENT: Normocephalic, atraumatic, mild flushed appearance.  Pupils are equal, round and reactive to light, extraocular tracking is well-preserved.  No nystagmus noted.  Hearing is grossly intact to mildly impaired.  Face is symmetric without any facial masking noted, normal facial animation.  Speech is clear without dysarthria, hypophonia or voice tremor.  He has noisy breathing at times and intermittent bouts of cough.  There is no lip, neck/head, jaw or voice tremor. Neck is supple with full range of passive and active motion. There are no carotid bruits on auscultation. Oropharynx exam reveals: mild mouth dryness, adequate dental hygiene with partial  plate noted on top. Tongue protrudes centrally and palate elevates symmetrically.   Chest: intermittent scattered  wheezing noted, also coarse breath sounds and occasional rhonchi, occasional bouts of coughing and noisy breathing noted.  He seems to be slightly out of breath after walking.    Heart: S1+S2+0, regular and normal without murmurs, rubs or gallops noted.   Abdomen: Soft, non-tender and non-distended with normal bowel sounds appreciated on auscultation.  Extremities: There is no pitting edema in the distal lower extremities bilaterally.   Skin: Warm and dry without trophic changes noted.  Musculoskeletal: exam reveals missing right index fingertip.  He reports an injury some 6 years ago.    Neurologically:  Mental status: The patient is awake, alert and oriented in all 4 spheres. His immediate and remote memory, attention, language skills and fund of knowledge are appropriate. There is no evidence of aphasia, agnosia, apraxia or anomia. Speech is clear with normal prosody and enunciation. Thought process is linear. Mood is normal and affect is normal.  Cranial nerves II - XII are as described above under HEENT exam. In addition: shoulder shrug is normal with equal shoulder height noted. Motor exam: Normal bulk, strength and tone is noted. There is no drift, r to mild esting tremor or rebound.   On 01/27/2021: On Archimedes spiral drawing he has coarse and variable difficulty with both hands, handwriting is legible, rather large and somewhat tremulous. He has a minimal to mild bilateral upper extremity postural tremor with somewhat variable amplitude and frequency.  He has a slight action tremor in both upper extremities, no resting tremor, no lower extremity tremor.  Romberg testing shows mild swaying.  Reflexes are 1+ in the upper extremities, trace in the knees, absent in the ankles.  Babinski: Toes are flexor bilaterally. Fine motor skills and coordination: Generally mildly impaired  with slow and deliberate movements noted but no decrement in amplitude, foot taps and finger taps are similar.  Rapid alternating movements in the upper extremities are mildly slow but doable.   Cerebellar testing: No dysmetria or intention tremor on finger to nose testing. Heel to shin is unremarkable bilaterally with the exception of slight decrease in range of motion noted in the hips.  No truncal or gait ataxia noted. Sensory exam: intact to light touch in the upper and lower extremities.  Gait, station and balance: He stands without difficulty.  He has slight difficulty standing narrow based, walking shows preserved arm swing, no shuffling.  He has slight insecurity with turning.  Tandem walk is difficult for him and he has to hold on to the side of the exam table.    Assessment and Plan:   In summary, Sean Saunders is a very pleasant 75 y.o.-year old male with an underlying medical history of hypertension, hyperlipidemia, hypothyroidism, TIA, stroke, prior smoking, reflux disease, DVT, diabetes, COPD, arthritis, restless leg syndrome, and mild obesity, who presents for evaluation of his tremor disorder of 2 to 3 months duration.  His history and examination are not telltale for essential tremor.  Tremor may tie in with underlying anxiety and depression which may be suboptimally treated at this time.  He also has significant lung disease and medication for this could also exacerbate tremors.  We talked about symptomatic treatment options but I do not suggest a beta-blocker given his COPD and diabetes history.  In addition, beta-blockers can exacerbate depressive symptoms.  He is advised to talk to you about his depression management as he has had some recurrence of depressive symptoms but attributes these to life stressors.  He is advised to stay well-hydrated with  water.  Mysoline can be considered for symptomatic tremor control and is considered first-line for essential tremor although his history is  not compelling for essential tremor.  Nevertheless, I would not recommend a trial with Mysoline because it can cause drowsiness and exacerbation of balance issues and his exam shows a nonspecific problem with his balance.  He is noted to have quite coarse breathing sounds and noisy breathing, mild shortness of breath as well is scattered wheezing on auscultation.  He is advised to monitor his symptoms.  He has been followed by pulmonology.  He indicates not taking his nebulizer and his inhaler as frequently as indicated in his pulmonologist's note recently.  He also reports having had quite a bit of nasal congestion and postnasal drip.  This may exacerbate cough and wheezing.  He is advised to follow-up at this point with you and his pulmonologist on a regular basis.  I explained to him why I would not recommend symptomatic treatment for his mild tremor at this time.  I answered all his questions today and he was in agreement with the plan.  I would be happy to see him back in this clinic on an as-needed basis.   Thank you very much for allowing me to participate in the care of this nice patient. If I can be of any further assistance to you please do not hesitate to call me at [phone].  Sincerely,   Star Age, MD, PhD

## 2021-01-27 NOTE — Patient Instructions (Signed)
You have a mild tremor of both hands.  I do not see any signs or symptoms of parkinson's like disease or what we call parkinsonism.  For your tremor, I would not recommend any new medication for fear of side effects: A beta-blocker may exacerbate your lung disease and is not good for patients who have diabetes.  I would recommend you avoid taking a medication called beta-blocker which we to utilize for tremor control in some patients.  I also would not recommend a trial of another medication called Mysoline which we also utilized for tremor control in some patients but it may exacerbate your balance issues.  You do have some baseline problems with your balance.  At this juncture, would recommend you work on staying well-hydrated, limiting your caffeine to 1-2 servings per day.    Please [facility] with [facility].    I did notice some wheezing and coarse breath sounds on lung auscultation.  You also have intermittent mild bouts of coughing and noisy breathing.  Please follow-up with your lung doctor on a regular basis.  Sometimes using an inhaler or nebulizer can exacerbate tremors as well temporarily.  Please keep that in mind.    Please remember, that any kind of tremor may be exacerbated by anxiety, anger, nervousness, excitement, dehydration, sleep deprivation, by caffeine, and low blood sugar values or blood sugar fluctuations or thyroid disease.  Please make sure that you get your thyroid function checked with your next scheduled blood work through your primary care.  If you have ongoing symptoms of depression and/or anxiety, please talk to your primary care about other options since you are no longer on sertraline.  I would be happy to see you back in this clinic on an as-needed basis.

## 2021-02-01 IMAGING — CT CT ABD-PELV W/ CM
2 of 5 series · 15 of 46 positions shown, 17 images · IV contrast (OMNIPAQUE 300)
Comparison: None.

CLINICAL DATA: 73-year-old male with abdominal pain and distention.

EXAM:
CT ABDOMEN AND PELVIS WITH CONTRAST
TECHNIQUE: Multidetector CT imaging of the abdomen and pelvis was performed
using the standard protocol following bolus administration of
intravenous contrast.
CONTRAST:  80mL OMNIPAQUE IOHEXOL 300 MG/ML  SOLN

[Series 2: axial st · axial · 0.79mm/px · z∈[-395,-30]mm · 12 of 87 slices shown, 14 images]
[im 7/87  soft-tissue]
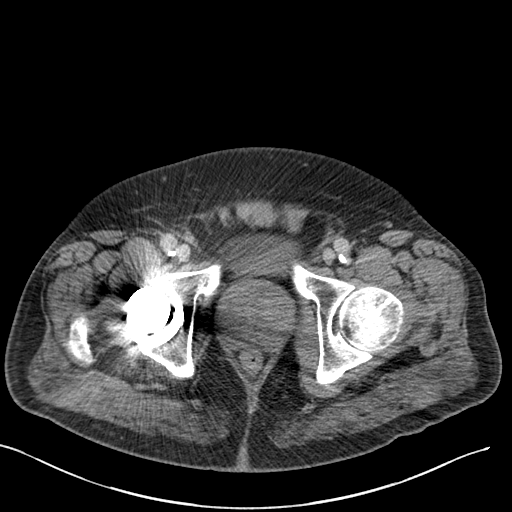
[im 7/87  bone]
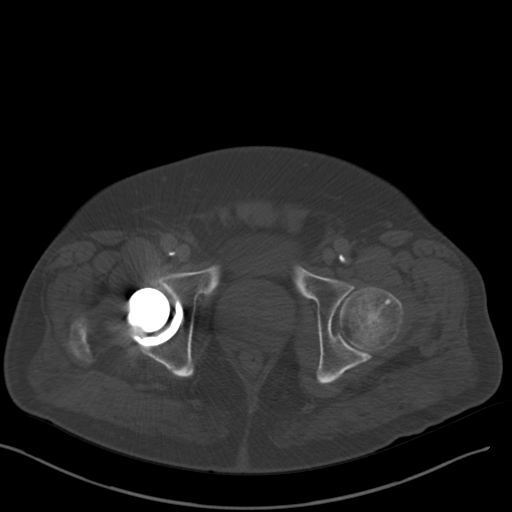
[im 14/87  soft-tissue]
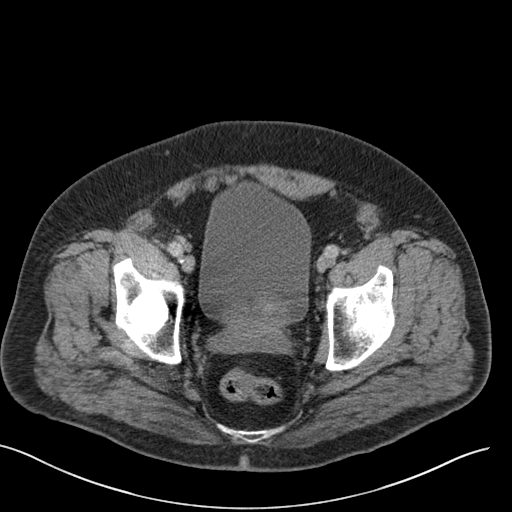
[im 20/87  soft-tissue]
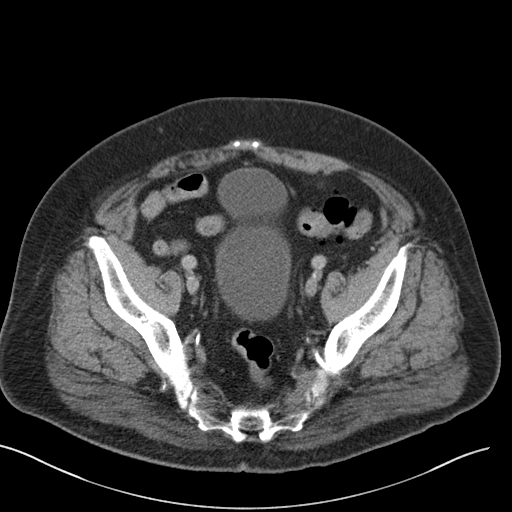
[im 27/87  soft-tissue]
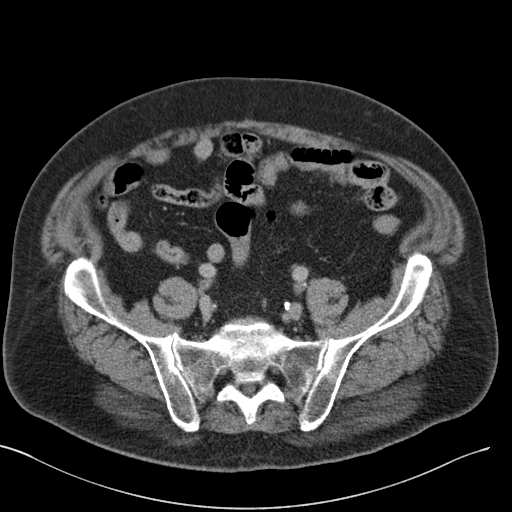
[im 34/87  soft-tissue]
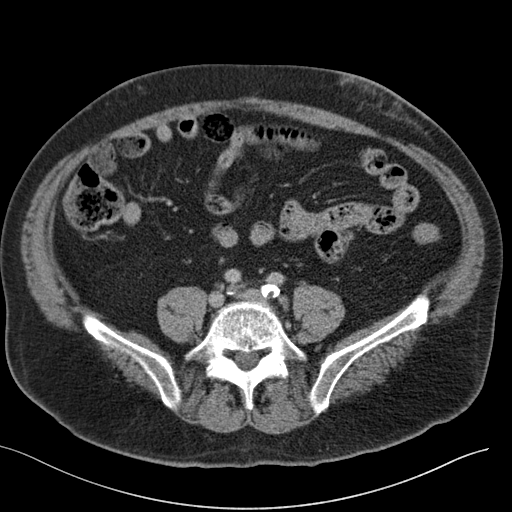
[im 40/87  soft-tissue]
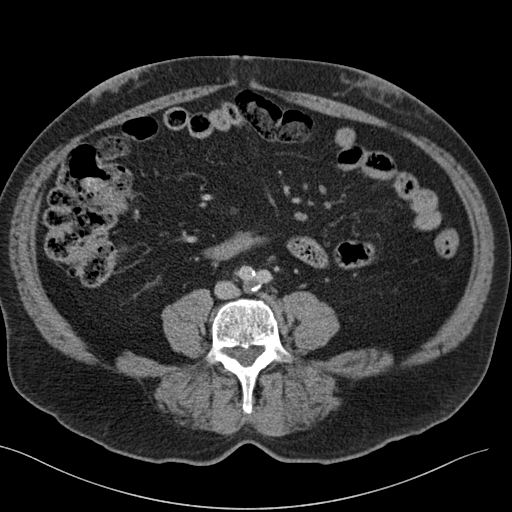
[im 47/87  soft-tissue]
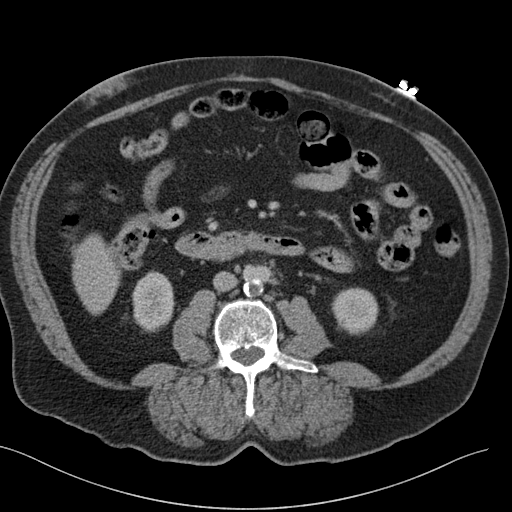
[im 53/87  soft-tissue]
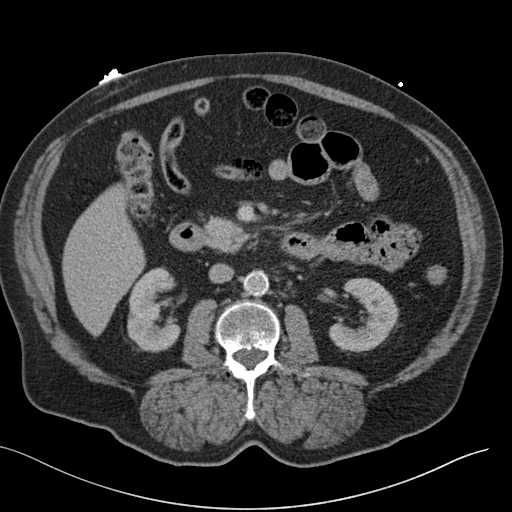
[im 60/87  soft-tissue]
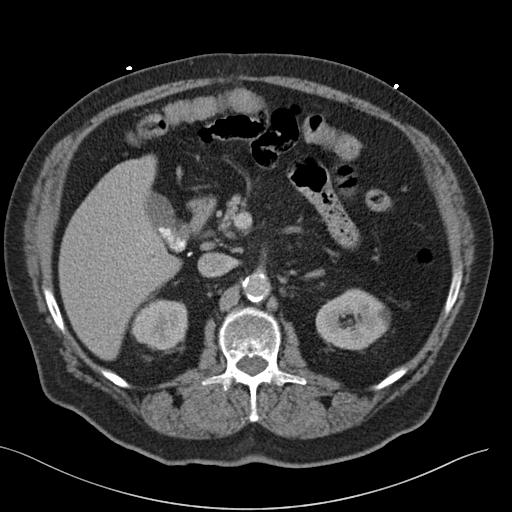
[im 60/87  bone]
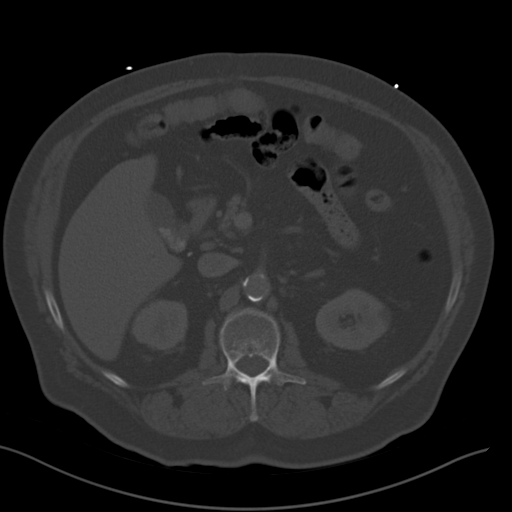
[im 67/87  soft-tissue]
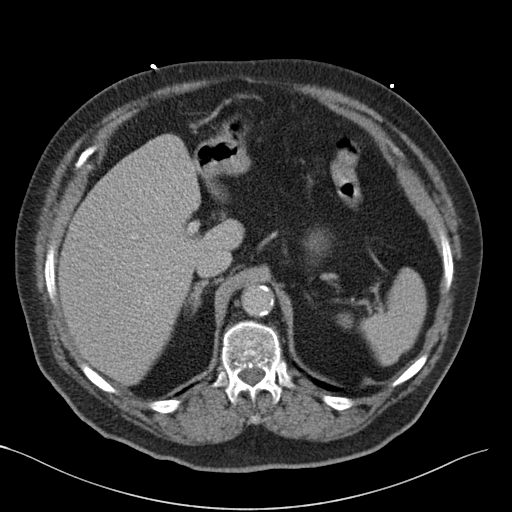
[im 73/87  soft-tissue]
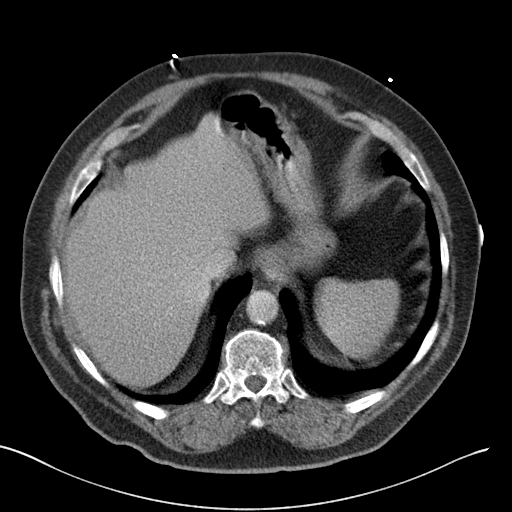
[im 80/87  soft-tissue]
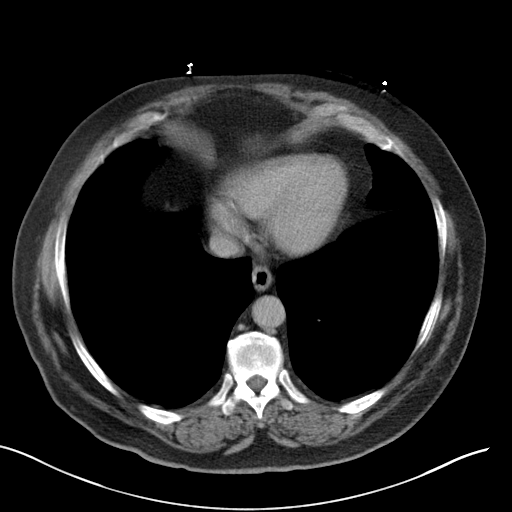

[Series 5: coronal st · coronal · 0.77mm/px · 3 of 151 slices shown]
[im 51/151  soft-tissue]
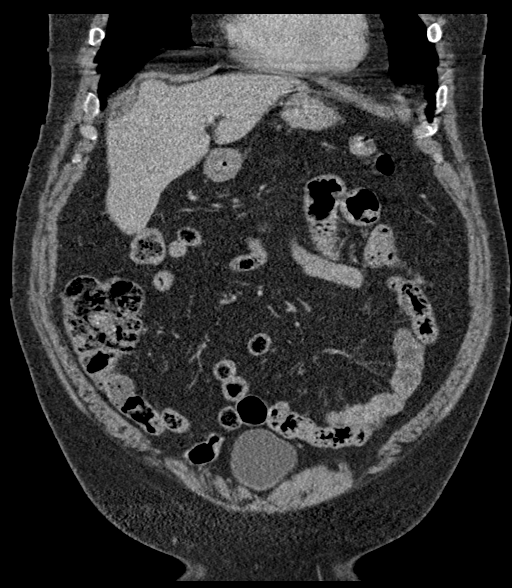
[im 67/151  soft-tissue]
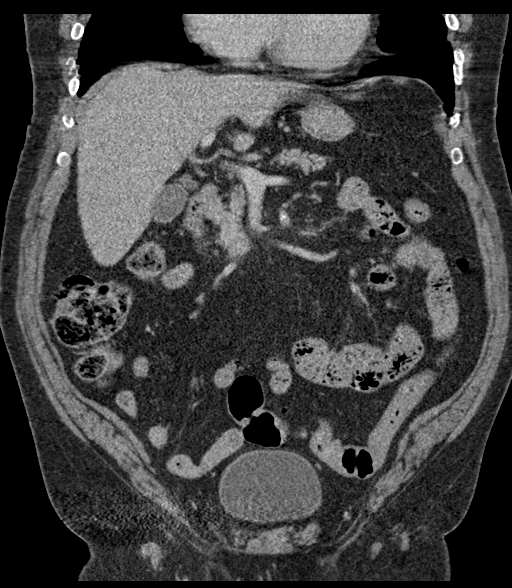
[im 84/151  soft-tissue]
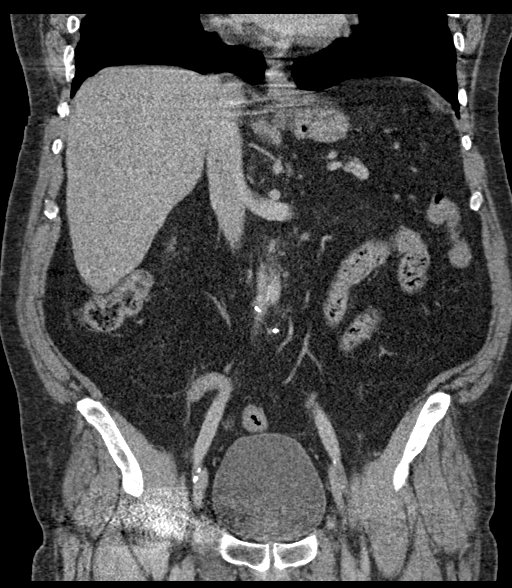

[15 of 46 positions shown; findings below may reference images not displayed]

FINDINGS: Evaluation of this exam is limited due to respiratory motion
artifact.

Lower chest: The visualized lung bases are clear. Coronary vascular
calcification of the RCA noted.

No intra-abdominal free air or free fluid.

Hepatobiliary: Apparent mild fatty infiltration of the liver. No
intrahepatic biliary ductal dilatation. There multiple stones within
the gallbladder. No pericholecystic fluid or evidence of acute
cholecystitis by CT.

Pancreas: There is mild haziness and inflammatory changes of the
head and uncinate process of the pancreas most consistent with acute
pancreatitis. Correlation with pancreatic enzymes recommended. No
drainable fluid collection/abscess or pseudocyst. No dilatation of
the main pancreatic duct.

Spleen: Normal in size without focal abnormality.

Adrenals/Urinary Tract: The adrenal glands are unremarkable. There
is no hydronephrosis on either side. There is symmetric enhancement
and excretion of contrast by both kidneys. Bilateral subcentimeter
hypodense lesions are not characterized on this CT. There is a 15 mm
indeterminate low attenuating lesion in the interpolar aspect of the
right kidney. Further evaluation with ultrasound on a
nonemergent/outpatient basis recommended. The visualized ureters and
urinary bladder appear unremarkable

Stomach/Bowel: There is sigmoid diverticulosis and scattered colonic
diverticula without active inflammatory changes. Several normal
caliber fecalized loops of small bowel in the mid abdomen may
represent increased transit time or small intestinal bacterial
overgrowth. Clinical correlation is recommended. There is no bowel
obstruction. Appendectomy.

Vascular/Lymphatic: Advanced aortoiliac atherosclerotic disease.
There is occlusion of the distal abdominal aorta status post prior
aortobifemoral bypass graft. The graft appears patent as visualized.
The IVC is unremarkable. No portal venous gas. There is no
adenopathy.

Reproductive: Enlarged prostate gland measuring approximately 7 cm
in transverse axial dimension.

Other: Induration of the subcutaneous soft tissues of the anterior
abdominal wall, likely related to subcutaneous injections. No fluid
collection.

Musculoskeletal: Total right hip arthroplasty. No acute osseous
pathology.
IMPRESSION: 1. Acute pancreatitis.  No abscess or pseudocyst.
2. Cholelithiasis.
3. Diverticulosis.  No bowel obstruction or active inflammation.
4.  Aortic Atherosclerosis ([id number]-YRX.X).
5. Occlusion of the distal abdominal aorta status post prior
aortobifemoral bypass graft.
6. Enlarged prostate gland.

## 2021-02-10 ENCOUNTER — Encounter: Payer: Self-pay | Admitting: Vascular Surgery

## 2021-02-10 ENCOUNTER — Ambulatory Visit (INDEPENDENT_AMBULATORY_CARE_PROVIDER_SITE_OTHER)
Admission: RE | Admit: 2021-02-10 | Discharge: 2021-02-10 | Disposition: A | Discharge status: Home / Self Care | Payer: Medicare PPO | Source: Ambulatory Visit | Attending: Vascular Surgery | Admitting: Vascular Surgery

## 2021-02-10 ENCOUNTER — Ambulatory Visit (INDEPENDENT_AMBULATORY_CARE_PROVIDER_SITE_OTHER): Payer: Medicare PPO | Admitting: Vascular Surgery

## 2021-02-10 ENCOUNTER — Other Ambulatory Visit: Payer: Self-pay

## 2021-02-10 ENCOUNTER — Ambulatory Visit (HOSPITAL_COMMUNITY)
Admission: RE | Admit: 2021-02-10 | Discharge: 2021-02-10 | Disposition: A | Discharge status: Home / Self Care | Payer: Medicare PPO | Source: Ambulatory Visit | Attending: Vascular Surgery | Admitting: Vascular Surgery

## 2021-02-10 VITALS — BP 160/83 | HR 86 | Temp 98.2°F | Resp 20 | Ht 72.0 in | Wt 230.5 lb

## 2021-02-10 DIAGNOSIS — I6523 Occlusion and stenosis of bilateral carotid arteries: Secondary | ICD-10-CM | POA: Diagnosis not present

## 2021-02-10 DIAGNOSIS — I739 Peripheral vascular disease, unspecified: Secondary | ICD-10-CM

## 2021-02-10 DIAGNOSIS — I6522 Occlusion and stenosis of left carotid artery: Secondary | ICD-10-CM | POA: Diagnosis not present

## 2021-02-10 NOTE — Progress Notes (Signed)
REASON FOR VISIT:   Follow-up of peripheral vascular disease and carotid disease  MEDICAL ISSUES:   PERIPHERAL VASCULAR DISEASE: The patient's aortobifemoral bypass graft is patent.  He has strong femoral pulses.  His left femoropopliteal bypass graft is patent by duplex with biphasic signals in the left foot and an ABI of 88%.  I encouraged him to stay as active as possible.  He is on Plavix and is on a statin.  His activity is limited by his COPD.  I have ordered a graft duplex and ABIs in 1 year I will see him back at that time.  He knows to call sooner if he has problems.  LEFT CAROTID STENOSIS: This patient has an asymptomatic 40 to 59% left carotid stenosis with no significant stenosis on the right.  He understands we would not consider carotid endarterectomy unless the stenosis progressed to greater than 80% or he develops left hemispheric symptoms.  I ordered follow-up carotid duplex scan in 1 year and I will see him at that time.  He knows to call sooner if he has problems.   HPI:   Sean Saunders is a pleasant 75 y.o. male who I have been following for many years.  In August 2006 he had an aortobifemoral bypass graft.  He subsequently required a left femoropopliteal bypass to the below-knee popliteal artery in July 2019.  He also had some mild carotid disease which we have been following her.  Since I saw him last he denies any history of stroke, TIAs, expressive or receptive aphasia, or amaurosis fugax.  He denies any claudication although his activity is limited by his COPD.  He is followed by pulmonary.  He denies any history of rest pain or nonhealing ulcers.  He is not a smoker.  He is on Plavix and is on a statin.  Unfortunately one of his daughters was recently diagnosed with ovarian cancer.  Past Medical History:  Diagnosis Date  . Arthritis   . COPD (chronic obstructive pulmonary disease) ([place])   . Diabetes mellitus   . DVT (deep venous thrombosis) ([place])   .  GERD (gastroesophageal reflux disease)   . Hyperlipidemia   . Hypertension   . Hypothyroidism   . Onychomycosis 06/07/2013  . Stroke [facility])    TIA's 01/05/11-01/08/11-01/26/11  . TIA (transient ischemic attack) 08/03/2011   Left sided weakness. 3 episodes Feb-March    Family History  Problem Relation Age of Onset  . Stroke Mother   . Diabetes Mother   . Other Father        alzheimers  . Diabetes Father   . Stroke Father   . Diabetes Sister     SOCIAL HISTORY: Social History   Tobacco Use  . Smoking status: Former Smoker    Packs/day: 1.00    Years: 25.00    Pack years: 25.00    Types: Cigarettes    Quit date: 06/05/2018    Years since quitting: 2.6  . Smokeless tobacco: Never Used  Substance Use Topics  . Alcohol use: Not Currently    Alcohol/week: 0.0 standard drinks    Comment: rare    Allergies  Allergen Reactions  . Eszopiclone Itching  . Niaspan [Niacin Er] Nausea And Vomiting and Rash  . Other Hives and Nausea And Vomiting    Ozempic and     Current Outpatient Medications  Medication Sig Dispense Refill  . allopurinol (ZYLOPRIM) 100 MG tablet Take 100 mg by mouth 2 (two) times daily.    Marland Kitchen  amLODipine (NORVASC) 10 MG tablet Take 10 mg by mouth daily.    . cetirizine (ZYRTEC) 10 MG tablet TAKE 1 TABLET(10 MG) BY MOUTH DAILY 30 tablet 5  . clopidogrel (PLAVIX) 75 MG tablet Take 75 mg by mouth daily.    . fluticasone (FLONASE) 50 MCG/ACT nasal spray Place 2 sprays into both nostrils daily. 16 g 5  . furosemide (LASIX) 20 MG tablet Take 20 mg by mouth daily.    . hydrALAZINE (APRESOLINE) 50 MG tablet Take 50 mg by mouth every morning.    . hydrochlorothiazide (HYDRODIURIL) 25 MG tablet Take 25 mg by mouth daily.    . insulin regular human CONCENTRATED (HUMULIN R) 500 UNIT/ML injection Inject into the skin See admin instructions. 60 units at breakfast, 20 units at lunch and 30 at dinner    . irbesartan (AVAPRO) 150 MG tablet Take 1 tablet (150 mg total) by mouth  daily. 30 tablet 3  . levothyroxine (SYNTHROID, LEVOTHROID) 150 MCG tablet Take 150 mcg by mouth daily before breakfast.    . lisinopril (ZESTRIL) 40 MG tablet Take 40 mg by mouth daily.    . metFORMIN (GLUCOPHAGE) 500 MG tablet Take 500 mg by mouth 2 (two) times daily with a meal.    . Multiple Vitamin (MULTIVITAMIN WITH MINERALS) TABS tablet Take 1 tablet by mouth daily.    Marland Kitchen rOPINIRole (REQUIP) 0.25 MG tablet Take 0.25 mg by mouth at bedtime.    . rosuvastatin (CRESTOR) 40 MG tablet Take 20 mg by mouth daily.     . tamsulosin (FLOMAX) 0.4 MG CAPS capsule Take 0.4 mg by mouth.     No current facility-administered medications for this visit.    REVIEW OF SYSTEMS:  [X]  denotes positive finding, [ ]  denotes negative finding Cardiac  Comments:  Chest pain or chest pressure:    Shortness of breath upon exertion: x   Short of breath when lying flat:    Irregular heart rhythm:        Vascular    Pain in calf, thigh, or hip brought on by ambulation:    Pain in feet at night that wakes you up from your sleep:     Blood clot in your veins:    Leg swelling:         Pulmonary    Oxygen at home:    Productive cough:     Wheezing:         Neurologic    Sudden weakness in arms or legs:     Sudden numbness in arms or legs:     Sudden onset of difficulty speaking or slurred speech:    Temporary loss of vision in one eye:     Problems with dizziness:         Gastrointestinal    Blood in stool:     Vomited blood:         Genitourinary    Burning when urinating:     Blood in urine:        Psychiatric    Major depression:         Hematologic    Bleeding problems:    Problems with blood clotting too easily:        Skin    Rashes or ulcers:        Constitutional    Fever or chills:     PHYSICAL EXAM:   Vitals:   02/10/21 1412 02/10/21 1415  BP: (!) 150/82 (!) 160/83  Pulse: 86  Resp: 20   Temp: 98.2 F (36.8 C)   SpO2: 95%   Weight: 104.6 kg   Height: 6' (1.829 m)      GENERAL: The patient is a well-nourished male, in no acute distress. The vital signs are documented above. CARDIAC: There is a regular rate and rhythm.  VASCULAR: I do not detect carotid bruits. He has palpable femoral pulses and palpable pedal pulses bilaterally. PULMONARY: There is good air exchange bilaterally without wheezing or rales. ABDOMEN: Soft and non-tender with normal pitched bowel sounds.  MUSCULOSKELETAL: There are no major deformities or cyanosis. NEUROLOGIC: No focal weakness or paresthesias are detected. SKIN: There are no ulcers or rashes noted. PSYCHIATRIC: The patient has a normal affect.  DATA:    CAROTID DUPLEX: I have independently interpreted his carotid duplex scan today.  On the right side there is a less than 39% stenosis.  The right vertebral artery is patent with antegrade flow.  On the left side there is a 40 to 59% stenosis.  Left vertebral artery is patent with antegrade flow.  ARTERIAL DOPPLER STUDY: I have independently interpreted his arterial Doppler study today.  On the right side there is a triphasic dorsalis pedis and posterior tibial signal.  ABIs 100%.  Toe pressure of 135 mmHg.  On the left side there is a biphasic dorsalis pedis and posterior tibial signal.  ABI is 88%.  Toe pressures 116 mmHg.  GRAFT DUPLEX: I have independently interpreted his graft duplex scan.  He has a left femoral to below-knee popliteal artery bypass.  His bypass graft is widely patent without any areas of stenosis noted.        Deitra Mayo [facility] [phone]

## 2021-02-11 DIAGNOSIS — E1051 Type 1 diabetes mellitus with diabetic peripheral angiopathy without gangrene: Secondary | ICD-10-CM | POA: Diagnosis not present

## 2021-02-11 DIAGNOSIS — L84 Corns and callosities: Secondary | ICD-10-CM | POA: Diagnosis not present

## 2021-02-11 DIAGNOSIS — L603 Nail dystrophy: Secondary | ICD-10-CM | POA: Diagnosis not present

## 2021-02-11 DIAGNOSIS — I739 Peripheral vascular disease, unspecified: Secondary | ICD-10-CM | POA: Diagnosis not present

## 2021-04-09 DIAGNOSIS — L814 Other melanin hyperpigmentation: Secondary | ICD-10-CM | POA: Diagnosis not present

## 2021-04-09 DIAGNOSIS — C4442 Squamous cell carcinoma of skin of scalp and neck: Secondary | ICD-10-CM | POA: Diagnosis not present

## 2021-04-09 DIAGNOSIS — L821 Other seborrheic keratosis: Secondary | ICD-10-CM | POA: Diagnosis not present

## 2021-04-09 DIAGNOSIS — Z85828 Personal history of other malignant neoplasm of skin: Secondary | ICD-10-CM | POA: Diagnosis not present

## 2021-04-09 DIAGNOSIS — L570 Actinic keratosis: Secondary | ICD-10-CM | POA: Diagnosis not present

## 2021-04-09 DIAGNOSIS — D225 Melanocytic nevi of trunk: Secondary | ICD-10-CM | POA: Diagnosis not present

## 2021-04-29 ENCOUNTER — Telehealth: Payer: Self-pay | Admitting: Emergency Medicine

## 2021-04-29 NOTE — Telephone Encounter (Signed)
Primary Pulmonologist: [place] Last office visit and with whom: 11/02/2021  Dr. Lamonte Sakai What do we see them for (pulmonary problems): COPD Last OV assessment/plan:  Assessment & Plan Note by Collene Gobble, MD at 11/02/2020 9:45 AM  Author: Collene Gobble, MD Author Type: Physician Filed: 11/02/2020 9:45 AM  Note Status: Written Cosign: Cosign Not Required Encounter Date: 11/02/2020  Problem: COPD (chronic obstructive pulmonary disease) [facility])  Editor: Collene Gobble, MD (Physician)               He is overtreating, using Trelegy sometimes twice a day although unclear whether he is getting a clinical benefit.  It may be that he is delivering poorly and is getting better deposition of his DuoNeb.  Consider changing to a primarily nebulized regimen at some point in the future.  For now I'm going to try and continue his usual regimen, treat his upper airway secretions as able.       Patient Instructions by Collene Gobble, MD at 11/02/2020 9:15 AM  Author: Collene Gobble, MD Author Type: Physician Filed: 11/02/2020 9:43 AM  Note Status: Signed Cosign: Cosign Not Required Encounter Date: 11/02/2020  Editor: Collene Gobble, MD (Physician)               Continue Trelegy once a day (not twice a day).  Rinse and gargle.  Mouth and throat after using. You can use your ipratropium/albuterol (DuoNeb) nebulizer treatments up to every 6 hours if you need them for shortness of breath or wheezing. Keep Combivent available to use 2 puffs up to every 6 hours if needed continue Zyrtec once daily Use fluticasone (Flonase) nasal spray 2 sprays each nostril twice a day. Try starting nasal saline rinses to see if this helps clear your sinuses, decrease the mucus that impacts your throat each day Depending on how your mucus, cough or doing we may decide to send you back to see ENT.  We may also decide to send you for for swallow evaluation Follow with Dr Lamonte Sakai in 6 months or sooner if you have any  problems         Instructions  Continue Trelegy once a day (not twice a day).  Rinse and gargle.  Mouth and throat after using. You can use your ipratropium/albuterol (DuoNeb) nebulizer treatments up to every 6 hours if you need them for shortness of breath or wheezing. Keep Combivent available to use 2 puffs up to every 6 hours if needed continue Zyrtec once daily Use fluticasone (Flonase) nasal spray 2 sprays each nostril twice a day. Try starting nasal saline rinses to see if this helps clear your sinuses, decrease the mucus that impacts your throat each day Depending on how your mucus, cough or doing we may decide to send you back to see ENT.  We may also decide to send you for for swallow evaluation Follow with Dr Lamonte Sakai in 6 months or sooner if you have any problems       Reason for call: Congested in head and chest, when mucous comes up it is white, it is hard to get up.  Cough and gurgling in the chest, oxygen down to 90% percent, was disoriented 3 days ago, tripped and fell and sprained wrist and broke 2 toes.  Heat is killing his energy level for 2 weeks.  Using combivent and nebulizer, it is just stubborn.  Taking allergy medication (Zyrtec).  Denies chills, fever, body aches.  More sob than normal.  Does  not wear oxygen. Patient is requesting an antibiotic and steroids.   Dr. Lamonte Sakai, please advise.  (examples of things to ask: : When did symptoms start? Fever? Cough? Productive? Color to sputum? More sputum than usual? Wheezing? Have you needed increased oxygen? Are you taking your respiratory medications? What over the counter measures have you tried?)  Allergies  Allergen Reactions  . Eszopiclone Itching  . Niaspan [Niacin Er] Nausea And Vomiting and Rash  . Other Hives and Nausea And Vomiting    Ozempic and     Immunization History  Administered Date(s) Administered  . Fluad Quad(high Dose 65+) 08/13/2019, 08/31/2020  . Influenza, High Dose Seasonal PF 09/25/2017,  09/17/2018  . Influenza-Unspecified 11/11/2013  . PFIZER(Purple Top)SARS-COV-2 Vaccination 02/13/2020, 03/11/2020  . Tdap 12/11/2018

## 2021-04-29 NOTE — Telephone Encounter (Signed)
Called and spoke with patient, advised of recommendations per Dr. Lamonte Sakai, he verbalized understanding.  Scheduled for an OV tomorrow 04/29/2021 at 12 pm, advised to arrive by 11:45 am for check in.  Nothing further needed.

## 2021-04-29 NOTE — Telephone Encounter (Signed)
He needs to be seen - get him in for acute visit please

## 2021-04-30 ENCOUNTER — Encounter: Payer: Self-pay | Admitting: Adult Health

## 2021-04-30 ENCOUNTER — Ambulatory Visit (INDEPENDENT_AMBULATORY_CARE_PROVIDER_SITE_OTHER): Payer: Medicare PPO | Admitting: Adult Health

## 2021-04-30 ENCOUNTER — Emergency Department (HOSPITAL_COMMUNITY): Payer: No Typology Code available for payment source

## 2021-04-30 ENCOUNTER — Other Ambulatory Visit: Payer: Self-pay

## 2021-04-30 ENCOUNTER — Inpatient Hospital Stay (HOSPITAL_COMMUNITY)
Admission: EM | Admit: 2021-04-30 | Discharge: 2021-05-02 | DRG: 189 | Disposition: A | Discharge status: Home / Self Care | Payer: No Typology Code available for payment source | Attending: Internal Medicine | Admitting: Internal Medicine

## 2021-04-30 ENCOUNTER — Encounter (HOSPITAL_COMMUNITY): Payer: Self-pay

## 2021-04-30 ENCOUNTER — Observation Stay (HOSPITAL_COMMUNITY): Payer: No Typology Code available for payment source

## 2021-04-30 DIAGNOSIS — E1165 Type 2 diabetes mellitus with hyperglycemia: Secondary | ICD-10-CM | POA: Diagnosis present

## 2021-04-30 DIAGNOSIS — J329 Chronic sinusitis, unspecified: Secondary | ICD-10-CM | POA: Diagnosis present

## 2021-04-30 DIAGNOSIS — N183 Chronic kidney disease, stage 3 unspecified: Secondary | ICD-10-CM | POA: Diagnosis present

## 2021-04-30 DIAGNOSIS — J9601 Acute respiratory failure with hypoxia: Secondary | ICD-10-CM | POA: Diagnosis present

## 2021-04-30 DIAGNOSIS — R0602 Shortness of breath: Secondary | ICD-10-CM | POA: Diagnosis not present

## 2021-04-30 DIAGNOSIS — E1122 Type 2 diabetes mellitus with diabetic chronic kidney disease: Secondary | ICD-10-CM | POA: Diagnosis present

## 2021-04-30 DIAGNOSIS — Z8673 Personal history of transient ischemic attack (TIA), and cerebral infarction without residual deficits: Secondary | ICD-10-CM

## 2021-04-30 DIAGNOSIS — I5032 Chronic diastolic (congestive) heart failure: Secondary | ICD-10-CM | POA: Diagnosis present

## 2021-04-30 DIAGNOSIS — Z86718 Personal history of other venous thrombosis and embolism: Secondary | ICD-10-CM

## 2021-04-30 DIAGNOSIS — N1831 Chronic kidney disease, stage 3a: Secondary | ICD-10-CM | POA: Diagnosis present

## 2021-04-30 DIAGNOSIS — Z7902 Long term (current) use of antithrombotics/antiplatelets: Secondary | ICD-10-CM

## 2021-04-30 DIAGNOSIS — Z820 Family history of epilepsy and other diseases of the nervous system: Secondary | ICD-10-CM

## 2021-04-30 DIAGNOSIS — J441 Chronic obstructive pulmonary disease with (acute) exacerbation: Principal | ICD-10-CM | POA: Diagnosis present

## 2021-04-30 DIAGNOSIS — J449 Chronic obstructive pulmonary disease, unspecified: Secondary | ICD-10-CM | POA: Diagnosis present

## 2021-04-30 DIAGNOSIS — J440 Chronic obstructive pulmonary disease with acute lower respiratory infection: Secondary | ICD-10-CM | POA: Diagnosis present

## 2021-04-30 DIAGNOSIS — N179 Acute kidney failure, unspecified: Secondary | ICD-10-CM

## 2021-04-30 DIAGNOSIS — I739 Peripheral vascular disease, unspecified: Secondary | ICD-10-CM | POA: Diagnosis present

## 2021-04-30 DIAGNOSIS — J309 Allergic rhinitis, unspecified: Secondary | ICD-10-CM | POA: Diagnosis present

## 2021-04-30 DIAGNOSIS — Z833 Family history of diabetes mellitus: Secondary | ICD-10-CM

## 2021-04-30 DIAGNOSIS — Z823 Family history of stroke: Secondary | ICD-10-CM

## 2021-04-30 DIAGNOSIS — E785 Hyperlipidemia, unspecified: Secondary | ICD-10-CM | POA: Diagnosis present

## 2021-04-30 DIAGNOSIS — Z66 Do not resuscitate: Secondary | ICD-10-CM | POA: Diagnosis present

## 2021-04-30 DIAGNOSIS — E1140 Type 2 diabetes mellitus with diabetic neuropathy, unspecified: Secondary | ICD-10-CM | POA: Diagnosis present

## 2021-04-30 DIAGNOSIS — E1151 Type 2 diabetes mellitus with diabetic peripheral angiopathy without gangrene: Secondary | ICD-10-CM | POA: Diagnosis present

## 2021-04-30 DIAGNOSIS — N400 Enlarged prostate without lower urinary tract symptoms: Secondary | ICD-10-CM | POA: Diagnosis present

## 2021-04-30 DIAGNOSIS — E875 Hyperkalemia: Secondary | ICD-10-CM | POA: Diagnosis present

## 2021-04-30 DIAGNOSIS — M109 Gout, unspecified: Secondary | ICD-10-CM | POA: Diagnosis present

## 2021-04-30 DIAGNOSIS — E039 Hypothyroidism, unspecified: Secondary | ICD-10-CM | POA: Diagnosis present

## 2021-04-30 DIAGNOSIS — Z20822 Contact with and (suspected) exposure to covid-19: Secondary | ICD-10-CM | POA: Diagnosis present

## 2021-04-30 DIAGNOSIS — F32A Depression, unspecified: Secondary | ICD-10-CM | POA: Diagnosis present

## 2021-04-30 DIAGNOSIS — I119 Hypertensive heart disease without heart failure: Secondary | ICD-10-CM | POA: Diagnosis present

## 2021-04-30 DIAGNOSIS — Z79890 Hormone replacement therapy (postmenopausal): Secondary | ICD-10-CM

## 2021-04-30 DIAGNOSIS — Z79899 Other long term (current) drug therapy: Secondary | ICD-10-CM

## 2021-04-30 DIAGNOSIS — G4700 Insomnia, unspecified: Secondary | ICD-10-CM | POA: Diagnosis present

## 2021-04-30 DIAGNOSIS — Z794 Long term (current) use of insulin: Secondary | ICD-10-CM

## 2021-04-30 DIAGNOSIS — I130 Hypertensive heart and chronic kidney disease with heart failure and stage 1 through stage 4 chronic kidney disease, or unspecified chronic kidney disease: Secondary | ICD-10-CM | POA: Diagnosis present

## 2021-04-30 LAB — CBC WITH DIFFERENTIAL/PLATELET
Abs Immature Granulocytes: 0.03 10*3/uL (ref 0.00–0.07)
Basophils Absolute: 0.1 10*3/uL (ref 0.0–0.1)
Basophils Relative: 1 %
Eosinophils Absolute: 0.3 10*3/uL (ref 0.0–0.5)
Eosinophils Relative: 3 %
HCT: 36.2 % — ABNORMAL LOW (ref 39.0–52.0)
Hemoglobin: 11.6 g/dL — ABNORMAL LOW (ref 13.0–17.0)
Immature Granulocytes: 0 %
Lymphocytes Relative: 44 %
Lymphs Abs: 5.1 10*3/uL — ABNORMAL HIGH (ref 0.7–4.0)
MCH: 28.4 pg (ref 26.0–34.0)
MCHC: 32 g/dL (ref 30.0–36.0)
MCV: 88.7 fL (ref 80.0–100.0)
Monocytes Absolute: 0.8 10*3/uL (ref 0.1–1.0)
Monocytes Relative: 7 %
Neutro Abs: 5.1 10*3/uL (ref 1.7–7.7)
Neutrophils Relative %: 45 %
Platelets: 221 10*3/uL (ref 150–400)
RBC: 4.08 MIL/uL — ABNORMAL LOW (ref 4.22–5.81)
RDW: 14.9 % (ref 11.5–15.5)
WBC: 11.4 10*3/uL — ABNORMAL HIGH (ref 4.0–10.5)
nRBC: 0 % (ref 0.0–0.2)

## 2021-04-30 LAB — D-DIMER, QUANTITATIVE: D-Dimer, Quant: 1.59 ug/mL-FEU — ABNORMAL HIGH (ref 0.00–0.50)

## 2021-04-30 LAB — COMPREHENSIVE METABOLIC PANEL
ALT: 21 U/L (ref 0–44)
AST: 15 U/L (ref 15–41)
Albumin: 3.8 g/dL (ref 3.5–5.0)
Alkaline Phosphatase: 75 U/L (ref 38–126)
Anion gap: 14 (ref 5–15)
BUN: 64 mg/dL — ABNORMAL HIGH (ref 8–23)
CO2: 17 mmol/L — ABNORMAL LOW (ref 22–32)
Calcium: 9.5 mg/dL (ref 8.9–10.3)
Chloride: 110 mmol/L (ref 98–111)
Creatinine, Ser: 2.23 mg/dL — ABNORMAL HIGH (ref 0.61–1.24)
GFR, Estimated: 30 mL/min — ABNORMAL LOW (ref >60)
Glucose, Bld: 228 mg/dL — ABNORMAL HIGH (ref 70–99)
Potassium: 4.4 mmol/L (ref 3.5–5.1)
Sodium: 141 mmol/L (ref 135–145)
Total Bilirubin: <0.1 mg/dL — ABNORMAL LOW (ref 0.3–1.2)
Total Protein: 6.9 g/dL (ref 6.5–8.1)

## 2021-04-30 LAB — RESP PANEL BY RT-PCR (FLU A&B, COVID) ARPGX2
Influenza A by PCR: NEGATIVE
Influenza B by PCR: NEGATIVE
SARS Coronavirus 2 by RT PCR: NEGATIVE

## 2021-04-30 LAB — GLUCOSE, CAPILLARY
Glucose-Capillary: 406 mg/dL — ABNORMAL HIGH (ref 70–99)
Glucose-Capillary: 455 mg/dL — ABNORMAL HIGH (ref 70–99)

## 2021-04-30 LAB — TROPONIN I (HIGH SENSITIVITY)
Troponin I (High Sensitivity): 6 ng/L (ref <18)
Troponin I (High Sensitivity): 6 ng/L (ref <18)
Troponin I (High Sensitivity): 6 ng/L (ref <18)
Troponin I (High Sensitivity): 8 ng/L (ref <18)

## 2021-04-30 MED ORDER — MELATONIN 5 MG PO TABS
10.0000 mg | ORAL_TABLET | Freq: Every day | ORAL | Status: DC
  Administered 2021-04-30 – 2021-05-01 (×2): 10 mg via ORAL
  Filled 2021-04-30 (×2): qty 2

## 2021-04-30 MED ORDER — IPRATROPIUM-ALBUTEROL 0.5-2.5 (3) MG/3ML IN SOLN
3.0000 mL | Freq: Four times a day (QID) | RESPIRATORY_TRACT | Status: DC
  Administered 2021-04-30: 3 mL via RESPIRATORY_TRACT
  Filled 2021-04-30: qty 3

## 2021-04-30 MED ORDER — AMLODIPINE BESY-BENAZEPRIL HCL 10-40 MG PO CAPS: 1.0000 | ORAL_CAPSULE | Freq: Every day | ORAL | Status: DC

## 2021-04-30 MED ORDER — DM-GUAIFENESIN ER 30-600 MG PO TB12
1.0000 | ORAL_TABLET | Freq: Two times a day (BID) | ORAL | Status: DC
  Administered 2021-04-30 – 2021-05-01 (×2): 1 via ORAL
  Filled 2021-04-30 (×2): qty 1

## 2021-04-30 MED ORDER — CLOPIDOGREL BISULFATE 75 MG PO TABS
75.0000 mg | ORAL_TABLET | Freq: Every day | ORAL | Status: DC
  Administered 2021-05-01 – 2021-05-02 (×2): 75 mg via ORAL
  Filled 2021-04-30 (×2): qty 1

## 2021-04-30 MED ORDER — FLUTICASONE PROPIONATE 50 MCG/ACT NA SUSP
2.0000 | Freq: Every day | NASAL | Status: DC
  Administered 2021-05-01: 2 via NASAL
  Filled 2021-04-30: qty 16

## 2021-04-30 MED ORDER — FUROSEMIDE 40 MG PO TABS: 20.0000 mg | ORAL_TABLET | Freq: Every day | ORAL | Status: DC

## 2021-04-30 MED ORDER — INSULIN GLARGINE 100 UNIT/ML ~~LOC~~ SOLN
40.0000 [IU] | Freq: Every day | SUBCUTANEOUS | Status: DC
  Administered 2021-04-30 – 2021-05-01 (×2): 40 [IU] via SUBCUTANEOUS
  Filled 2021-04-30 (×2): qty 0.4

## 2021-04-30 MED ORDER — LABETALOL HCL 5 MG/ML IV SOLN
10.0000 mg | INTRAVENOUS | Status: DC | PRN
  Filled 2021-04-30: qty 4

## 2021-04-30 MED ORDER — LORATADINE 10 MG PO TABS
10.0000 mg | ORAL_TABLET | Freq: Every day | ORAL | Status: DC
  Administered 2021-05-01 – 2021-05-02 (×2): 10 mg via ORAL
  Filled 2021-04-30 (×2): qty 1

## 2021-04-30 MED ORDER — ONDANSETRON HCL 4 MG/2ML IJ SOLN: 4.0000 mg | Freq: Four times a day (QID) | INTRAMUSCULAR | Status: DC | PRN

## 2021-04-30 MED ORDER — ALLOPURINOL 100 MG PO TABS
100.0000 mg | ORAL_TABLET | Freq: Two times a day (BID) | ORAL | Status: DC
  Administered 2021-04-30 – 2021-05-02 (×4): 100 mg via ORAL
  Filled 2021-04-30 (×4): qty 1

## 2021-04-30 MED ORDER — ALBUTEROL SULFATE HFA 108 (90 BASE) MCG/ACT IN AERS: 2.0000 | INHALATION_SPRAY | RESPIRATORY_TRACT | Status: DC | PRN

## 2021-04-30 MED ORDER — ACETAMINOPHEN 650 MG RE SUPP: 650.0000 mg | Freq: Four times a day (QID) | RECTAL | Status: DC | PRN

## 2021-04-30 MED ORDER — TRAZODONE HCL 50 MG PO TABS: 25.0000 mg | ORAL_TABLET | Freq: Every evening | ORAL | Status: DC | PRN

## 2021-04-30 MED ORDER — ROSUVASTATIN CALCIUM 20 MG PO TABS
40.0000 mg | ORAL_TABLET | Freq: Every day | ORAL | Status: DC
  Administered 2021-05-01 – 2021-05-02 (×2): 40 mg via ORAL
  Filled 2021-04-30 (×2): qty 2

## 2021-04-30 MED ORDER — ROPINIROLE HCL 0.25 MG PO TABS
0.2500 mg | ORAL_TABLET | Freq: Every day | ORAL | Status: DC
  Administered 2021-04-30 – 2021-05-01 (×2): 0.25 mg via ORAL
  Filled 2021-04-30 (×2): qty 1

## 2021-04-30 MED ORDER — ENOXAPARIN SODIUM 40 MG/0.4ML IJ SOSY
40.0000 mg | PREFILLED_SYRINGE | INTRAMUSCULAR | Status: DC
  Administered 2021-04-30 – 2021-05-01 (×2): 40 mg via SUBCUTANEOUS
  Filled 2021-04-30 (×2): qty 0.4

## 2021-04-30 MED ORDER — AMLODIPINE BESYLATE 10 MG PO TABS
10.0000 mg | ORAL_TABLET | Freq: Every day | ORAL | Status: DC
  Administered 2021-05-01 – 2021-05-02 (×2): 10 mg via ORAL
  Filled 2021-04-30 (×2): qty 1

## 2021-04-30 MED ORDER — BENAZEPRIL HCL 10 MG PO TABS: 40.0000 mg | ORAL_TABLET | Freq: Every day | ORAL | Status: DC

## 2021-04-30 MED ORDER — MAGNESIUM SULFATE 2 GM/50ML IV SOLN
2.0000 g | Freq: Once | INTRAVENOUS | Status: AC
  Administered 2021-04-30: 2 g via INTRAVENOUS
  Filled 2021-04-30: qty 50

## 2021-04-30 MED ORDER — ALUM & MAG HYDROXIDE-SIMETH 200-200-20 MG/5ML PO SUSP
30.0000 mL | Freq: Once | ORAL | Status: AC
  Administered 2021-04-30: 30 mL via ORAL
  Filled 2021-04-30: qty 30

## 2021-04-30 MED ORDER — SODIUM CHLORIDE 0.9 % IV SOLN: INTRAVENOUS | Status: DC

## 2021-04-30 MED ORDER — INSULIN ASPART 100 UNIT/ML IJ SOLN
7.0000 [IU] | Freq: Once | INTRAMUSCULAR | Status: AC
  Administered 2021-04-30: 7 [IU] via SUBCUTANEOUS

## 2021-04-30 MED ORDER — TRAMADOL HCL 50 MG PO TABS
50.0000 mg | ORAL_TABLET | Freq: Four times a day (QID) | ORAL | Status: DC | PRN
  Administered 2021-04-30: 50 mg via ORAL
  Filled 2021-04-30: qty 1

## 2021-04-30 MED ORDER — MORPHINE SULFATE (PF) 2 MG/ML IV SOLN: 2.0000 mg | INTRAVENOUS | Status: DC | PRN

## 2021-04-30 MED ORDER — INSULIN ASPART 100 UNIT/ML IJ SOLN
0.0000 [IU] | Freq: Three times a day (TID) | INTRAMUSCULAR | Status: DC
  Administered 2021-05-01 (×2): 11 [IU] via SUBCUTANEOUS
  Administered 2021-05-01: 7 [IU] via SUBCUTANEOUS
  Administered 2021-05-02: 11 [IU] via SUBCUTANEOUS
  Administered 2021-05-02: 4 [IU] via SUBCUTANEOUS

## 2021-04-30 MED ORDER — ACETAMINOPHEN 325 MG PO TABS: 650.0000 mg | ORAL_TABLET | Freq: Four times a day (QID) | ORAL | Status: DC | PRN

## 2021-04-30 MED ORDER — TRAZODONE HCL 100 MG PO TABS: 50.0000 mg | ORAL_TABLET | Freq: Every evening | ORAL | Status: DC | PRN

## 2021-04-30 MED ORDER — IPRATROPIUM-ALBUTEROL 0.5-2.5 (3) MG/3ML IN SOLN
3.0000 mL | Freq: Three times a day (TID) | RESPIRATORY_TRACT | Status: DC
  Administered 2021-05-01: 3 mL via RESPIRATORY_TRACT
  Filled 2021-04-30: qty 3

## 2021-04-30 MED ORDER — CARVEDILOL 25 MG PO TABS
25.0000 mg | ORAL_TABLET | Freq: Two times a day (BID) | ORAL | Status: DC
  Administered 2021-05-01 – 2021-05-02 (×3): 25 mg via ORAL
  Filled 2021-04-30 (×3): qty 1

## 2021-04-30 MED ORDER — PREDNISONE 20 MG PO TABS
60.0000 mg | ORAL_TABLET | Freq: Every day | ORAL | Status: DC
  Administered 2021-05-01: 60 mg via ORAL
  Filled 2021-04-30: qty 3

## 2021-04-30 MED ORDER — ALBUTEROL SULFATE HFA 108 (90 BASE) MCG/ACT IN AERS
2.0000 | INHALATION_SPRAY | Freq: Once | RESPIRATORY_TRACT | Status: AC
  Administered 2021-04-30: 2 via RESPIRATORY_TRACT
  Filled 2021-04-30: qty 6.7

## 2021-04-30 MED ORDER — EMPAGLIFLOZIN 10 MG PO TABS: 10.0000 mg | ORAL_TABLET | Freq: Every day | ORAL | Status: DC

## 2021-04-30 MED ORDER — HYDROCHLOROTHIAZIDE 25 MG PO TABS: 25.0000 mg | ORAL_TABLET | Freq: Two times a day (BID) | ORAL | Status: DC

## 2021-04-30 MED ORDER — HYDRALAZINE HCL 50 MG PO TABS: 50.0000 mg | ORAL_TABLET | Freq: Four times a day (QID) | ORAL | Status: DC | PRN

## 2021-04-30 MED ORDER — LEVOTHYROXINE SODIUM 50 MCG PO TABS
150.0000 ug | ORAL_TABLET | Freq: Every day | ORAL | Status: DC
  Administered 2021-05-01 – 2021-05-02 (×2): 150 ug via ORAL
  Filled 2021-04-30 (×2): qty 1

## 2021-04-30 MED ORDER — TAMSULOSIN HCL 0.4 MG PO CAPS
0.4000 mg | ORAL_CAPSULE | Freq: Every day | ORAL | Status: DC
  Administered 2021-04-30 – 2021-05-01 (×2): 0.4 mg via ORAL
  Filled 2021-04-30 (×2): qty 1

## 2021-04-30 MED ORDER — METHYLPREDNISOLONE SODIUM SUCC 125 MG IJ SOLR: 125.0000 mg | Freq: Once | INTRAMUSCULAR | Status: DC

## 2021-04-30 MED ORDER — PREDNISONE 20 MG PO TABS: 40.0000 mg | ORAL_TABLET | Freq: Every day | ORAL | Status: DC

## 2021-04-30 MED ORDER — SPIRONOLACTONE 25 MG PO TABS
25.0000 mg | ORAL_TABLET | Freq: Every day | ORAL | Status: DC
  Administered 2021-05-01: 25 mg via ORAL
  Filled 2021-04-30: qty 1

## 2021-04-30 MED ORDER — ONDANSETRON HCL 4 MG PO TABS: 4.0000 mg | ORAL_TABLET | Freq: Four times a day (QID) | ORAL | Status: DC | PRN

## 2021-04-30 NOTE — Progress Notes (Signed)
SATURATION QUALIFICATIONS: (This note is used to comply with regulatory documentation for home oxygen)  Patient Saturations on Room Air at Rest = 84%  Patient Saturations on Room Air while Ambulating = %  Patient Saturations on 2 Liters of oxygen while Ambulating = 96%  Please briefly explain why patient needs home oxygen: verbal orders give by Dr Darl Householder, pt COPD and will need oxygen for home. [place], [place] ED TOC CM [phone]

## 2021-04-30 NOTE — Progress Notes (Addendum)
.   Transition of Care [facility]) - Emergency Department Mini Assessment   Patient Details  Name: Sean Saunders MRN: [id number] Date of Birth: 12/15/1945  [facility]) CM/SW Contact:    Erenest Rasher, RN Phone Number: 04/30/2021, 3:30 PM   Clinical Narrative: TOC CM spoke to pt and states his dtr, Raquel Sarna and grand-dtr will there to accept oxygen at the home on tomorrow. States he is agreeable to stay overnight as requested by the ED provider. Contacted [employer] and they will deliver oxygen concentrator and portable to home tomorrow, 05/01/2021. Dtr will be able to bring portable to hospital at time of dc. Pt states he had Pulm Rehab in the past. Dtr will discuss with pt smoking cessation. States he does not smoke when they are there in the home. His grand-dtr will be staying the summer with pt.   ED provider updated.   ED Mini Assessment: What brought you to the Emergency Department? : shortness of breath  Barriers to Discharge: Continued Medical Work up  [employer] interventions: arranged oxygen     Interventions which prevented an admission or readmission: DME Provided    Patient Contact and Communications Key Contact 1: [place] with: daughter Contact Date: 04/30/21,   Contact time: 1529 Contact Phone Number: [phone] Call outcome: states she will be there to accept oxygen at the home tomorrow    CMS Medicare.gov Compare Post Acute Care list provided to:: Patient    Admission diagnosis:  shob;rhonchi;breathing tx in process Patient Active Problem List   Diagnosis Date Noted  . Stridor 08/31/2020  . Chronic respiratory failure with hypoxia ([place]) 08/31/2020  . Depression 07/16/2020  . Acute respiratory failure with hypoxia ([place]) 06/27/2020  . Fever   . Embolism and thrombosis of arteries of lower extremity ([place]) 01/10/2020  . Cholelithiasis 12/31/2019  . Acute pancreatitis 12/31/2019  . Chronic sinusitis 09/03/2019  . CKD (chronic kidney  disease), stage III ([place]) 09/17/2018  . Meningioma, cerebral ([place]) 09/17/2018  . Vestibular schwannoma ([place]) 09/17/2018  . Rhinitis, allergic 09/25/2017  . Tobacco use disorder 09/11/2017  . COPD (chronic obstructive pulmonary disease) ([place]) 09/10/2017  . Hypertension with heart disease 09/04/2017  . Pain in lower limb 03/21/2014  . Type 2 diabetes mellitus with diabetic neuropathy, unspecified ([place]) 03/21/2014  . PVD (peripheral vascular disease) ([place]) 12/11/2013  . Occlusion and stenosis of carotid artery without mention of cerebral infarction 12/11/2013  . Onychomycosis 06/07/2013  . Pain in joint, ankle and foot 06/07/2013  . Callus of foot 06/07/2013  . Peripheral vascular disease, unspecified ([place]) 05/29/2013  . Aftercare following surgery of the circulatory system, [place] 05/29/2013  . Other postoperative infection 11/13/2012  . Neuropathic pain of lower extremity 11/23/2011  . Atherosclerosis of native arteries of the extremities with intermittent claudication 10/12/2011   PCP:  [place], Betty G, MD Pharmacy:   [facility] [phone] [place], [place] - [address] [place] [address] Phone: [phone] Fax: [place], [place] - [place] [place] AT [facility] [address] [place] [address] Phone: [phone] Fax: [phone]

## 2021-04-30 NOTE — H&P (Signed)
History and Physical    Sean Saunders [id number] DOB: 1946/05/24 DOA: 04/30/2021  PCP: Martinique, Betty G, MD  Patient coming from: Home  I have personally briefly reviewed patient's old medical records in [place]  Chief Complaint: Hypoxia, shortness of breath  HPI: Sean Saunders is a 75 y.o. male with medical history significant of COPD not on home oxygen, PVD, HTN, T2DM, CKD stage III, BPH presented to the ED from pulmonology clinic today due to hypoxia with O2 sat 84% on room air.  Case management in the ED arranged for home oxygen, however it was not able to be delivered until tomorrow.  Patient is therefore admitted for observation for necessary supplemental oxygen.  He reports several days of increased shortness of breath, using his nebulizers and inhalers more frequently.  Dyspneic on exertion to the point he has to rest several times while dressing himself in the morning.  Denies any fevers, chills, myalgias or otherwise feeling sick.  While in the ED, patient attempted to open a package of mustard using his teeth and an upper front tooth fell out.  He has some pain because of this there is no bleeding.   ED Course: Stable vitals but requiring 3 L/min supplemental oxygen.  Labs notable for mild leukocytosis 11.4, mild anemia hemoglobin 11.4, hyperglycemia 228, worsened renal function with creatinine 2.23 (compared to 1.14 in July 2021), BUN 64.  Bicarb 17 with normal anion gap.  Troponin x2 negative.  COVID and influenza a/B PCR negative.  Chest x-ray negative.  Admitted for observation until home oxygen can be delivered.   Review of Systems: As per HPI otherwise 10 point review of systems negative.    Past Medical History:  Diagnosis Date  . Arthritis   . COPD (chronic obstructive pulmonary disease) ([place])   . Diabetes mellitus   . DVT (deep venous thrombosis) ([place])   . GERD (gastroesophageal reflux disease)   . Hyperlipidemia   . Hypertension   .  Hypothyroidism   . Onychomycosis 06/07/2013  . Stroke [facility])    TIA's 01/05/11-01/08/11-01/26/11  . TIA (transient ischemic attack) 08/03/2011   Left sided weakness. 3 episodes Feb-March    Past Surgical History:  Procedure Laterality Date  . ABDOMINAL AORTAGRAM N/A 06/04/2012   Procedure: ABDOMINAL Maxcine Ham;  Surgeon: Angelia Mould, MD;  Location: [facility] CATH LAB;  Service: Cardiovascular;  Laterality: N/A;  . APPENDECTOMY    . COLONOSCOPY    . FEMORAL-POPLITEAL BYPASS GRAFT  07/05/2012   Procedure: BYPASS GRAFT FEMORAL-POPLITEAL ARTERY;  Surgeon: Angelia Mould, MD;  Location: [facility] OR;  Service: Vascular;  Laterality: Left;  Revision Left fem/pop w/ artery patch angioplasty utilizing left GSV graft and extenion to tib/per trunk w/right GSV and interop arteriogram  . JOINT REPLACEMENT  2011   -Hip  . NASAL SINUS SURGERY    . PR VEIN BYPASS GRAFT,AORTO-FEM-POP    . PR VEIN BYPASS GRAFT,AORTO-FEM-POP  08-01-11   (L) Fem-pop BP  . toe nail    . TONSILLECTOMY       reports that he quit smoking about 2 years ago. His smoking use included cigarettes. He has a 25.00 pack-year smoking history. He has never used smokeless tobacco. He reports previous alcohol use. He reports that he does not use drugs.  Allergies  Allergen Reactions  . Eszopiclone Itching  . Niaspan [Niacin Er] Nausea And Vomiting and Rash  . Other Hives and Nausea And Vomiting    Ozempic and  Family History  Problem Relation Age of Onset  . Stroke Mother   . Diabetes Mother   . Other Father        alzheimers  . Diabetes Father   . Stroke Father   . Diabetes Sister      Prior to Admission medications   Medication Sig Start Date End Date Taking? Authorizing Provider  albuterol (VENTOLIN HFA) 108 (90 Base) MCG/ACT inhaler Inhale 2 puffs into the lungs every 6 (six) hours as needed for wheezing or shortness of breath.   Yes [provider]  allopurinol (ZYLOPRIM) 100 MG tablet Take 100 mg by mouth  2 (two) times daily.   Yes [provider]  amLODipine-benazepril (LOTREL) 10-40 MG capsule Take 1 capsule by mouth daily. 04/19/21  Yes [provider]  cetirizine (ZYRTEC) 10 MG tablet TAKE 1 TABLET(10 MG) BY MOUTH DAILY 01/11/21  Yes Byrum, Rose Fillers, MD  cholecalciferol (VITAMIN D3) 25 MCG (1000 UNIT) tablet Take 1,000 Units by mouth daily.   Yes [provider]  clopidogrel (PLAVIX) 75 MG tablet Take 75 mg by mouth daily.   Yes [provider]  empagliflozin (JARDIANCE) 25 MG TABS tablet Take 0.5 tablets by mouth daily. 03/02/21 03/03/22 Yes [provider]  fluticasone (FLONASE) 50 MCG/ACT nasal spray Place 2 sprays into both nostrils daily. 08/07/19  Yes Martinique, Betty G, MD  furosemide (LASIX) 20 MG tablet Take 20 mg by mouth daily.   Yes [provider]  hydrochlorothiazide (HYDRODIURIL) 25 MG tablet Take 25 mg by mouth 2 (two) times daily. 06/07/20  Yes [provider]  insulin regular human CONCENTRATED (HUMULIN R) 500 UNIT/ML kwikpen Inject 40-70 Units into the skin 3 (three) times daily as needed (high blood sugar). 03/02/21  Yes [provider]  Ipratropium-Albuterol (COMBIVENT RESPIMAT) 20-100 MCG/ACT AERS respimat Inhale 1 puff into the lungs 2 (two) times daily as needed for wheezing or shortness of breath.   Yes [provider]  ipratropium-albuterol (DUONEB) 0.5-2.5 (3) MG/3ML SOLN Take 3 mLs by nebulization every 6 (six) hours as needed (sob & wheezing).   Yes [provider]  levothyroxine (SYNTHROID, LEVOTHROID) 150 MCG tablet Take 150 mcg by mouth daily before breakfast.   Yes [provider]  metFORMIN (GLUCOPHAGE) 500 MG tablet Take 500 mg by mouth 2 (two) times daily with a meal.   Yes [provider]  Multiple Vitamin (MULTIVITAMIN WITH MINERALS) TABS tablet Take 1 tablet by mouth daily.   Yes [provider]  rOPINIRole (REQUIP) 0.25 MG tablet Take 0.25 mg by mouth at  bedtime.   Yes [provider]  rosuvastatin (CRESTOR) 40 MG tablet Take 40 mg by mouth daily. 04/06/20  Yes [provider]  spironolactone (ALDACTONE) 25 MG tablet Take 1 tablet by mouth daily. 02/09/21 02/10/22 Yes [provider]  tamsulosin (FLOMAX) 0.4 MG CAPS capsule Take 0.4 mg by mouth.   Yes [provider]  vitamin C (ASCORBIC ACID) 500 MG tablet Take 500 mg by mouth daily.   Yes [provider]  vitamin E 180 MG (400 UNITS) capsule Take 400 Units by mouth daily.   Yes [provider]  irbesartan (AVAPRO) 150 MG tablet Take 1 tablet (150 mg total) by mouth daily. Patient not taking: No sig reported 09/03/20   Collene Gobble, MD    Physical Exam: Vitals:   04/30/21 1440 04/30/21 1530 04/30/21 1600 04/30/21 1630  BP: 126/68 (!) 154/66 (!) 117/103 (!) 150/77  Pulse: 90  89 93 96  Resp: (!) 24 (!) 22 19 (!) 24  Temp:      TempSrc:      SpO2: 96% 95% 96% 98%     Constitutional: NAD, calm, comfortable Eyes: EOMI, lids and conjunctivae normal ENMT: Mucous membranes are moist.  Missing upper front tooth, no bleeding.  Hearing grossly normal Respiratory: Poor aeration with diffuse expiratory wheezes and coarse upper airway sounds referred throughout, Normal respiratory effort. No accessory muscle use.  Frequent productive sounding coughing spells Cardiovascular: RRR, no murmurs / rubs / gallops. No extremity edema.  Abdomen: soft, NT, ND, no masses or HSM palpated. +Bowel sounds.  Musculoskeletal: no clubbing / cyanosis. Normal muscle tone.  No peripheral edema Skin: Face appears flushed, dry, intact, normal color, normal temperature Neurologic: CN 2-12 grossly intact. Normal speech.  Grossly non-focal exam. Psychiatric: Alert and oriented x 3. Normal mood. Congruent affect.  Normal judgement and insight.  (Anything < 9 systems with 2 bullets each down codes to level 1) (If patient refuses exam can't bill higher level) (Make sure to  document decubitus ulcers present on admission -- if possible -- and whether patient has chronic indwelling catheter at time of admission)  Labs on Admission: I have personally reviewed following labs and imaging studies  CBC: Recent Labs  Lab 04/30/21 1343  WBC 11.4*  NEUTROABS 5.1  HGB 11.6*  HCT 36.2*  MCV 88.7  PLT 315   Basic Metabolic Panel: Recent Labs  Lab 04/30/21 1343  NA 141  K 4.4  CL 110  CO2 17*  GLUCOSE 228*  BUN 64*  CREATININE 2.23*  CALCIUM 9.5   GFR: Estimated Creatinine Clearance: 35.8 mL/min (A) (by C-G formula based on SCr of 2.23 mg/dL (H)). Liver Function Tests: Recent Labs  Lab 04/30/21 1343  AST 15  ALT 21  ALKPHOS 75  BILITOT <0.1*  PROT 6.9  ALBUMIN 3.8   No results for input(s): LIPASE, AMYLASE in the last 168 hours. No results for input(s): AMMONIA in the last 168 hours. Coagulation Profile: No results for input(s): INR, PROTIME in the last 168 hours. Cardiac Enzymes: No results for input(s): CKTOTAL, CKMB, CKMBINDEX, TROPONINI in the last 168 hours. BNP (last 3 results) No results for input(s): PROBNP in the last 8760 hours. HbA1C: No results for input(s): HGBA1C in the last 72 hours. CBG: No results for input(s): GLUCAP in the last 168 hours. Lipid Profile: No results for input(s): CHOL, HDL, LDLCALC, TRIG, CHOLHDL, LDLDIRECT in the last 72 hours. Thyroid Function Tests: No results for input(s): TSH, T4TOTAL, FREET4, T3FREE, THYROIDAB in the last 72 hours. Anemia Panel: No results for input(s): VITAMINB12, FOLATE, FERRITIN, TIBC, IRON, RETICCTPCT in the last 72 hours. Urine analysis:    Component Value Date/Time   COLORURINE YELLOW 06/27/2020 0547   APPEARANCEUR CLEAR 06/27/2020 0547   LABSPEC 1.013 06/27/2020 0547   PHURINE 5.0 06/27/2020 0547   GLUCOSEU >=500 (A) 06/27/2020 0547   HGBUR NEGATIVE 06/27/2020 0547   BILIRUBINUR NEGATIVE 06/27/2020 0547   KETONESUR 5 (A) 06/27/2020 0547   PROTEINUR 100 (A) 06/27/2020  0547   UROBILINOGEN 0.2 07/03/2012 0937   NITRITE NEGATIVE 06/27/2020 0547   LEUKOCYTESUR NEGATIVE 06/27/2020 0547    Radiological Exams on Admission: DG Chest Port 1 View  Result Date: 04/30/2021 CLINICAL DATA:  Shortness of breath. EXAM: PORTABLE CHEST 1 VIEW COMPARISON:  September 16, 2020. FINDINGS: The heart size and mediastinal contours are within normal limits. Both lungs are clear. The visualized skeletal structures are  unremarkable. IMPRESSION: No active disease. Electronically Signed   By: Marijo Conception M.D.   On: 04/30/2021 14:56    EKG: Independently reviewed.  Normal sinus rhythm at 85 bpm, low voltage in precordial leads, no acute ischemic changes  Assessment/Plan Active Problems:   Acute respiratory failure with hypoxia (HCC)    Acute respiratory failure with hypoxia -appears due to COPD exacerbation.   -- Supplement O2 to maintain sats 88 to 93% --Wean as tolerated -- Home oxygen has been set up for delivery to his home tomorrow -- Given history of prior DVT, VQ scan and lower extremity Dopplers  COPD with acute exacerbation -patient has poor aeration and expiratory wheezing, frequent coughing.  Received 125 mg IV Solu-Medrol with EMS. -- Prednisone 60 mg starting tomorrow -- Scheduled DuoNebs, as needed albuterol -- Scheduled Mucinex -- Pulmonary hygiene: I-S, flutter  Acute kidney injury -present with creatinine 2.23, most recent baseline 10 months ago 1.14.  Unclear cause, possibly mild dehydration. -- IV fluids -- BMP to monitor --Renal ultrasound to rule out obstruction, does have history of BPH -- Hold home HCTZ, Lasix, Jardiance, benazepril  Essential hypertension -uncontrolled on admission. -- Given AKI, hold HCTZ, Lasix, benazepril --Continue home amlodipine 10 mg daily --Add Coreg 25 mg twice daily to substitute for his held medications -- As needed oral hydralazine and/or IV labetalol  Insulin-dependent type 2 diabetes -has significant insulin  requirements. -- A1c pending -- Start with Lantus 40 units at bedtime -- Resistant sliding scale above -- Adjust insulin as needed for inpatient goal 140-180  Hypothyroidism -continue levothyroxine  History of PVD, hyperlipidemia -continue Plavix, Crestor  BPH -continue Flomax  Insomnia -takes melatonin at home, ordered.  Tramadol if needed despite melatonin.  Allergic rhinitis with frequent sinusitis -continue loratadine (for Zyrtec), Flonase  History of gout -continue allopurinol   DVT prophylaxis: enoxaparin (LOVENOX) injection 40 mg Start: 04/30/21 1700   Code Status: DNR  Disposition Plan: Anticipate DC home in 24 to 48 hours pending improved renal function and home oxygen delivery Consults called: None  Admission status:  Status is: Observation  The patient remains OBS appropriate and will d/c before 2 midnights.  Dispo: The patient is from: Home              Anticipated d/c is to: Home              Patient currently is not medically stable to d/c.   Difficult to place patient No         Ezekiel Slocumb, DO [facility]  04/30/2021, 4:55 PM    If 7PM-7AM, please contact night-coverage. How to contact the [facility] Attending or Consulting provider [place] or covering provider during after hours [place], for this patient?    1. Check the care team in [facility] and look for a) attending/consulting [facility] provider listed and b) the [facility] team listed 2. Log into [email or web address] and use [place]'s universal password to access. If you do not have the password, please contact the hospital operator. 3. Locate the [facility] provider you are looking for under [facility] and page to a number that you can be directly reached. 4. If you still have difficulty reaching the provider, please page the [facility] (Director on Call) for the Hospitalists listed on amion for assistance.

## 2021-04-30 NOTE — ED Provider Notes (Addendum)
[facility] DEPT Provider Note   CSN: [id number] Arrival date & time: 04/30/21  1316     History Chief Complaint  Patient presents with  . Shortness of Breath    Sean Saunders is a 75 y.o. male history of COPD, diabetes, DVT, hypertension here presenting with shortness of breath.  Patient states that he has longstanding history of COPD and started having nasal congestion for several days.  He states that he has nonproductive cough as well.  He has worsening shortness of breath.  He went to pulmonology clinic today and had an oxygen saturation of 84%.  Patient was given Solu-Medrol and DuoNeb prior to arrival.  He states that his oxygen is much better now after being put on 2 L.  He is not on oxygen at baseline.  Patient denies any chest pain.  He denies any fever or cough or COVID exposure.  Patient states that he received COVID vaccines as well as a booster.   The history is provided by the patient.       Past Medical History:  Diagnosis Date  . Arthritis   . COPD (chronic obstructive pulmonary disease) ([place])   . Diabetes mellitus   . DVT (deep venous thrombosis) ([place])   . GERD (gastroesophageal reflux disease)   . Hyperlipidemia   . Hypertension   . Hypothyroidism   . Onychomycosis 06/07/2013  . Stroke [facility])    TIA's 01/05/11-01/08/11-01/26/11  . TIA (transient ischemic attack) 08/03/2011   Left sided weakness. 3 episodes Feb-March    Patient Active Problem List   Diagnosis Date Noted  . Stridor 08/31/2020  . Chronic respiratory failure with hypoxia ([place]) 08/31/2020  . Depression 07/16/2020  . Acute respiratory failure with hypoxia ([place]) 06/27/2020  . Fever   . Embolism and thrombosis of arteries of lower extremity ([place]) 01/10/2020  . Cholelithiasis 12/31/2019  . Acute pancreatitis 12/31/2019  . Chronic sinusitis 09/03/2019  . CKD (chronic kidney disease), stage III ([place]) 09/17/2018  . Meningioma, cerebral ([place]) 09/17/2018  . Vestibular  schwannoma ([place]) 09/17/2018  . Rhinitis, allergic 09/25/2017  . Tobacco use disorder 09/11/2017  . COPD (chronic obstructive pulmonary disease) ([place]) 09/10/2017  . Hypertension with heart disease 09/04/2017  . Pain in lower limb 03/21/2014  . Type 2 diabetes mellitus with diabetic neuropathy, unspecified ([place]) 03/21/2014  . PVD (peripheral vascular disease) ([place]) 12/11/2013  . Occlusion and stenosis of carotid artery without mention of cerebral infarction 12/11/2013  . Onychomycosis 06/07/2013  . Pain in joint, ankle and foot 06/07/2013  . Callus of foot 06/07/2013  . Peripheral vascular disease, unspecified ([place]) 05/29/2013  . Aftercare following surgery of the circulatory system, [place] 05/29/2013  . Other postoperative infection 11/13/2012  . Neuropathic pain of lower extremity 11/23/2011  . Atherosclerosis of native arteries of the extremities with intermittent claudication 10/12/2011    Past Surgical History:  Procedure Laterality Date  . ABDOMINAL AORTAGRAM N/A 06/04/2012   Procedure: ABDOMINAL Maxcine Ham;  Surgeon: Angelia Mould, MD;  Location: [facility] CATH LAB;  Service: Cardiovascular;  Laterality: N/A;  . APPENDECTOMY    . COLONOSCOPY    . FEMORAL-POPLITEAL BYPASS GRAFT  07/05/2012   Procedure: BYPASS GRAFT FEMORAL-POPLITEAL ARTERY;  Surgeon: Angelia Mould, MD;  Location: [facility] OR;  Service: Vascular;  Laterality: Left;  Revision Left fem/pop w/ artery patch angioplasty utilizing left GSV graft and extenion to tib/per trunk w/right GSV and interop arteriogram  . JOINT REPLACEMENT  2011   -Hip  . NASAL SINUS  SURGERY    . PR VEIN BYPASS GRAFT,AORTO-FEM-POP    . PR VEIN BYPASS GRAFT,AORTO-FEM-POP  08-01-11   (L) Fem-pop BP  . toe nail    . TONSILLECTOMY         Family History  Problem Relation Age of Onset  . Stroke Mother   . Diabetes Mother   . Other Father        alzheimers  . Diabetes Father   . Stroke Father   . Diabetes Sister     Social History    Tobacco Use  . Smoking status: Former Smoker    Packs/day: 1.00    Years: 25.00    Pack years: 25.00    Types: Cigarettes    Quit date: 06/05/2018    Years since quitting: 2.9  . Smokeless tobacco: Never Used  Vaping Use  . Vaping Use: Never used  Substance Use Topics  . Alcohol use: Not Currently    Alcohol/week: 0.0 standard drinks    Comment: rare  . Drug use: No    Home Medications Prior to Admission medications   Medication Sig Start Date End Date Taking? Authorizing Provider  allopurinol (ZYLOPRIM) 100 MG tablet Take 100 mg by mouth 2 (two) times daily.    [provider]  amLODipine (NORVASC) 10 MG tablet Take 10 mg by mouth daily.    [provider]  cetirizine (ZYRTEC) 10 MG tablet TAKE 1 TABLET(10 MG) BY MOUTH DAILY 01/11/21   Collene Gobble, MD  clopidogrel (PLAVIX) 75 MG tablet Take 75 mg by mouth daily.    [provider]  fluticasone (FLONASE) 50 MCG/ACT nasal spray Place 2 sprays into both nostrils daily. 08/07/19   Martin[place], Betty G, MD  furosemide (LASIX) 20 MG tablet Take 20 mg by mouth daily.    [provider]  hydrALAZINE (APRESOLINE) 50 MG tablet Take 50 mg by mouth every morning. 05/08/20   [provider]  hydrochlorothiazide (HYDRODIURIL) 25 MG tablet Take 25 mg by mouth daily. 06/07/20   [provider]  insulin regular human CONCENTRATED (HUMULIN R) 500 UNIT/ML injection Inject into the skin See admin instructions. 60 units at breakfast, 20 units at lunch and 30 at dinner    [provider]  irbesartan (AVAPRO) 150 MG tablet Take 1 tablet (150 mg total) by mouth daily. 09/03/20   Collene Gobble, MD  levothyroxine (SYNTHROID, LEVOTHROID) 150 MCG tablet Take 150 mcg by mouth daily before breakfast.    [provider]  lisinopril (ZESTRIL) 40 MG tablet Take 40 mg by mouth daily.    [provider]  metFORMIN (GLUCOPHAGE) 500 MG tablet Take 500 mg by mouth 2 (two) times daily with a  meal.    [provider]  Multiple Vitamin (MULTIVITAMIN WITH MINERALS) TABS tablet Take 1 tablet by mouth daily.    [provider]  rOPINIRole (REQUIP) 0.25 MG tablet Take 0.25 mg by mouth at bedtime.    [provider]  rosuvastatin (CRESTOR) 40 MG tablet Take 20 mg by mouth daily.  04/06/20   [provider]  tamsulosin (FLOMAX) 0.4 MG CAPS capsule Take 0.4 mg by mouth.    [provider]    Allergies    Eszopiclone, Niaspan [niacin er], and Other  Review of Systems   Review of Systems  Respiratory: Positive for shortness of breath.   All other systems reviewed and are negative.   Physical Exam Updated Vital Signs BP (!) 154/66   Pulse 89  Temp 98.4 F (36.9 C) (Oral)   Resp (!) 22   SpO2 95%   Physical Exam Vitals and nursing note reviewed.  Constitutional:      Comments: Slightly tachypneic  HENT:     Head: Normocephalic.     Mouth/Throat:     Mouth: Mucous membranes are moist.  Eyes:     Extraocular Movements: Extraocular movements intact.     Pupils: Pupils are equal, round, and reactive to light.  Cardiovascular:     Rate and Rhythm: Normal rate.  Pulmonary:     Comments: Slightly tachypneic, mild diffuse wheezing and no retractions. Abdominal:     General: Bowel sounds are normal.     Palpations: Abdomen is soft.  Musculoskeletal:        General: Normal range of motion.     Cervical back: Normal range of motion and neck supple.  Skin:    General: Skin is warm.     Capillary Refill: Capillary refill takes less than 2 seconds.  Neurological:     General: No focal deficit present.     Mental Status: He is oriented to person, place, and time.  Psychiatric:        Mood and Affect: Mood normal.        Behavior: Behavior normal.     ED Results / Procedures / Treatments   Labs (all labs ordered are listed, but only abnormal results are displayed) Labs Reviewed  CBC WITH DIFFERENTIAL/PLATELET - Abnormal;  Notable for the following components:      Result Value   WBC 11.4 (*)    RBC 4.08 (*)    Hemoglobin 11.6 (*)    HCT 36.2 (*)    Lymphs Abs 5.1 (*)    All other components within normal limits  COMPREHENSIVE METABOLIC PANEL - Abnormal; Notable for the following components:   CO2 17 (*)    Glucose, Bld 228 (*)    BUN 64 (*)    Creatinine, Ser 2.23 (*)    Total Bilirubin <0.1 (*)    GFR, Estimated 30 (*)    All other components within normal limits  RESP PANEL BY RT-PCR (FLU A&B, COVID) ARPGX2  TROPONIN I (HIGH SENSITIVITY)  TROPONIN I (HIGH SENSITIVITY)    EKG EKG Interpretation  Date/Time:  Friday Apr 30 2021 14:02:56 EDT Ventricular Rate:  85 PR Interval:  173 QRS Duration: 92 QT Interval:  360 QTC Calculation: 428 R Axis:   46 Text Interpretation: Sinus rhythm Low voltage, precordial leads No significant change since last tracing Confirmed by Wandra Arthurs [phone]) on 04/30/2021 2:05:40 PM   Radiology DG Chest Port 1 View  Result Date: 04/30/2021 CLINICAL DATA:  Shortness of breath. EXAM: PORTABLE CHEST 1 VIEW COMPARISON:  September 16, 2020. FINDINGS: The heart size and mediastinal contours are within normal limits. Both lungs are clear. The visualized skeletal structures are unremarkable. IMPRESSION: No active disease. Electronically Signed   By: Marijo Conception M.D.   On: 04/30/2021 14:56    Procedures Procedures   CRITICAL CARE Performed by: Wandra Arthurs   Total critical care time: 30 minutes  Critical care time was exclusive of separately billable procedures and treating other patients.  Critical care was necessary to treat or prevent imminent or life-threatening deterioration.  Critical care was time spent personally by me on the following activities: development of treatment plan with patient and/or surrogate as well as nursing, discussions with consultants, evaluation of patient's response to treatment, examination of patient,  obtaining history from patient or  surrogate, ordering and performing treatments and interventions, ordering and review of laboratory studies, ordering and review of radiographic studies, pulse oximetry and re-evaluation of patient's condition.   Medications Ordered in ED Medications  methylPREDNISolone sodium succinate (SOLU-MEDROL) 125 mg/2 mL injection 125 mg (125 mg Intravenous Not Given 04/30/21 1401)  albuterol (VENTOLIN HFA) 108 (90 Base) MCG/ACT inhaler 2 puff (2 puffs Inhalation Given 04/30/21 1417)  magnesium sulfate IVPB 2 g 50 mL (0 g Intravenous Stopped 04/30/21 1517)    ED Course  I have reviewed the triage vital signs and the nursing notes.  Pertinent labs & imaging results that were available during my care of the patient were reviewed by me and considered in my medical decision making (see chart for details).    MDM Rules/Calculators/A&P                         Sean Saunders is a 75 y.o. male here presenting with shortness of breath.  Likely COPD exacerbation.  Will get basic blood work and chest x-ray and COVID test.  Patient was given steroids prior to arrival.  We will give another round of albuterol and magnesium.  Patient states that he would like to go home with oxygen if possible.  We will contact case management.   4:11 PM Labs are unremarkable.  Doing well on 2 L nasal cannula.  I contacted case management and they were able to deliver oxygen tomorrow but not today.  Will admit for observation for COPD exacerbation.   Final Clinical Impression(s) / ED Diagnoses Final diagnoses:  None    Rx / DC Orders ED Discharge Orders    None       Drenda Freeze, MD 04/30/21 1612    Drenda Freeze, MD 04/30/21 [phone]

## 2021-04-30 NOTE — Assessment & Plan Note (Signed)
New onset hypoxemia with associated COPD flare  Placed on O2 to maintain sats >88-90% .

## 2021-04-30 NOTE — ED Triage Notes (Signed)
Coming from home, SOB for 2 weeks, rhonchi

## 2021-04-30 NOTE — ED Notes (Signed)
yao md to bedside at this time, patient provided with ice water and [place] sandwich per request

## 2021-04-30 NOTE — Patient Instructions (Signed)
Go to ER

## 2021-04-30 NOTE — Progress Notes (Signed)
@Patient  ID: Sean Saunders, male    DOB: Apr 14, 1946, 75 y.o.   MRN: [id number]  Chief Complaint  Patient presents with  . Follow-up    Referring provider: [place], Betty G, MD  HPI: 75 year old male former smoker followed for severe COPD and allergic rhinitis  TEST/EVENTS :   04/30/2021 Acute OV : Cough  Patient presents for an acute office visit.  Patient complains over the last 2 weeks he has had increased cough congestion sinus drainage wheezing and shortness of breath.  Says that symptoms started out like a sinus infection.  He has had progressively worsening weakness general malaise and shortness of breath.  Patient says he is at the point where he has to rest while changing close.  He denies any fever, chest pain, orthopnea, increased edema.  Appetite has been down. Patient is supposed to be on Trelegy inhaler but has not been taking.  On arrival to the office today O2 saturations 83% on room air.  Required 3 to 4 L to maintain sats greater than 88 to 90%. Patient is very short of breath with talking, stooped over on exam table. Says he is weak.  Has been using his combivent inhaler often .     Allergies  Allergen Reactions  . Eszopiclone Itching  . Niaspan [Niacin Er] Nausea And Vomiting and Rash  . Other Hives and Nausea And Vomiting    Ozempic and     Immunization History  Administered Date(s) Administered  . Fluad Quad(high Dose 65+) 08/13/2019, 08/31/2020  . Influenza, High Dose Seasonal PF 09/25/2017, 09/17/2018  . Influenza-Unspecified 08/30/2011, 10/25/2012, 11/11/2013, 08/12/2020  . PFIZER Comirnaty(Gray Top)Covid-19 Tri-Sucrose Vaccine 01/20/2021  . PFIZER(Purple Top)SARS-COV-2 Vaccination 02/13/2020, 03/11/2020  . Pneumococcal Polysaccharide-23 11/08/2019  . Tdap 12/11/2018    Past Medical History:  Diagnosis Date  . Arthritis   . COPD (chronic obstructive pulmonary disease) ([place])   . Diabetes mellitus   . DVT (deep venous thrombosis) ([place])   . GERD  (gastroesophageal reflux disease)   . Hyperlipidemia   . Hypertension   . Hypothyroidism   . Onychomycosis 06/07/2013  . Stroke [facility])    TIA's 01/05/11-01/08/11-01/26/11  . TIA (transient ischemic attack) 08/03/2011   Left sided weakness. 3 episodes Feb-March    Tobacco History: Social History   Tobacco Use  Smoking Status Former Smoker  . Packs/day: 1.00  . Years: 25.00  . Pack years: 25.00  . Types: Cigarettes  . Quit date: 06/05/2018  . Years since quitting: 2.9  Smokeless Tobacco Never Used   Counseling given: Not Answered   Outpatient Medications Prior to Visit  Medication Sig Dispense Refill  . allopurinol (ZYLOPRIM) 100 MG tablet Take 100 mg by mouth 2 (two) times daily.    Marland Kitchen amLODipine (NORVASC) 10 MG tablet Take 10 mg by mouth daily.    . cetirizine (ZYRTEC) 10 MG tablet TAKE 1 TABLET(10 MG) BY MOUTH DAILY 30 tablet 5  . clopidogrel (PLAVIX) 75 MG tablet Take 75 mg by mouth daily.    . fluticasone (FLONASE) 50 MCG/ACT nasal spray Place 2 sprays into both nostrils daily. 16 g 5  . furosemide (LASIX) 20 MG tablet Take 20 mg by mouth daily.    . hydrALAZINE (APRESOLINE) 50 MG tablet Take 50 mg by mouth every morning.    . hydrochlorothiazide (HYDRODIURIL) 25 MG tablet Take 25 mg by mouth daily.    . insulin regular human CONCENTRATED (HUMULIN R) 500 UNIT/ML injection Inject into the skin See admin instructions. [place]  units at breakfast, 20 units at lunch and 30 at dinner    . irbesartan (AVAPRO) 150 MG tablet Take 1 tablet (150 mg total) by mouth daily. 30 tablet 3  . levothyroxine (SYNTHROID, LEVOTHROID) 150 MCG tablet Take 150 mcg by mouth daily before breakfast.    . lisinopril (ZESTRIL) 40 MG tablet Take 40 mg by mouth daily.    . metFORMIN (GLUCOPHAGE) 500 MG tablet Take 500 mg by mouth 2 (two) times daily with a meal.    . Multiple Vitamin (MULTIVITAMIN WITH MINERALS) TABS tablet Take 1 tablet by mouth daily.    Marland Kitchen rOPINIRole (REQUIP) 0.25 MG tablet Take 0.25 mg by mouth  at bedtime.    . rosuvastatin (CRESTOR) 40 MG tablet Take 20 mg by mouth daily.     . tamsulosin (FLOMAX) 0.4 MG CAPS capsule Take 0.4 mg by mouth.     No facility-administered medications prior to visit.     Review of Systems:   Constitutional:   No  weight loss, night sweats,  Fevers, chills,  +fatigue, or  lassitude.  HEENT:   No headaches,  Difficulty swallowing,  Tooth/dental problems, or  Sore throat,                No sneezing, itching, ear ache,  +nasal congestion, post nasal drip,   CV:  No chest pain,  Orthopnea, PND, swelling in lower extremities, anasarca, dizziness, palpitations, syncope.   GI  No heartburn, indigestion, abdominal pain, nausea, vomiting, diarrhea, change in bowel habits, loss of appetite, bloody stools.   Resp:  .  No chest wall deformity  Skin: no rash or lesions.  GU: no dysuria, change in color of urine, no urgency or frequency.  No flank pain, no hematuria   MS:  No joint pain or swelling.  No decreased range of motion.  No back pain.    Physical Exam  BP 126/60 (BP Location: Left Arm, Cuff Size: Normal)   Pulse 96   Temp 97.7 F (36.5 C)   Ht 6\' 1"  (1.854 m)   Wt 223 lb (101.2 kg)   SpO2 (!) 88%   BMI 29.42 kg/m   GEN: A/Ox3; pleasant , NAD, well nourished    HEENT:  [place]/AT,   , NOSE-clear, THROAT-clear, no lesions, no postnasal drip or exudate noted.   NECK:  Supple w/ fair ROM; no JVD; normal carotid impulses w/o bruits; no thyromegaly or nodules palpated; no lymphadenopathy.    RESP  Exp wheezing bilaterally, scattered rhonchi , no accessory muscle use, no dullness to percussion  CARD:  RRR, no m/r/g, no peripheral edema, pulses intact, no cyanosis or clubbing.  GI:   Soft & nt; nml bowel sounds; no organomegaly or masses detected.   Musco: Warm bil, no deformities or joint swelling noted.   Neuro: alert, no focal deficits noted.    Skin: Warm, no lesions or rashes    Lab Results:   BNP  Imaging: No results  found.    PFT Results Latest Ref Rng & Units 05/22/2018  FVC-Pre L 2.89  FVC-Predicted Pre % 61  FVC-Post L 3.02  FVC-Predicted Post % 64  Pre FEV1/FVC % % 59  Post FEV1/FCV % % 59  FEV1-Pre L 1.70  FEV1-Predicted Pre % 49  FEV1-Post L 1.80  DLCO uncorrected ml/min/mmHg 18.21  DLCO UNC% % 51  DLVA Predicted % 66  TLC L 6.79  TLC % Predicted % 91  RV % Predicted % 137    No  results found for: NITRICOXIDE      Assessment & Plan:   COPD (chronic obstructive pulmonary disease) ([place]) Acute COPD flare with new onset hypoxemia at rest - needs further evaluation with probable admission  Acute infectious symptoms, need flu and covid testing  Will send to ER via EMS .  High risk for decompensation  On return need to discuss maintenance inhaler compliance   Plan  Patient Instructions  Go to ER .        Acute respiratory failure with hypoxia ([place]) New onset hypoxemia with associated COPD flare  Placed on O2 to maintain sats >88-90% .       Rexene Edison, NP 04/30/2021

## 2021-04-30 NOTE — ED Notes (Signed)
Patient provided with warm blankets and hand urinal per request

## 2021-04-30 NOTE — ED Notes (Signed)
Portable xray to bedside at this time 

## 2021-04-30 NOTE — Assessment & Plan Note (Signed)
Acute COPD flare with new onset hypoxemia at rest - needs further evaluation with probable admission  Acute infectious symptoms, need flu and covid testing  Will send to ER via EMS .  High risk for decompensation  On return need to discuss maintenance inhaler compliance   Plan  Patient Instructions  Go to ER .

## 2021-04-30 NOTE — Progress Notes (Signed)
Pt states he was opening his mustard and broke off his top front tooth. He has his tooth laying on bedside table. I informed his rn [facility]. Pt states he is not having any pain. Lucius Conn BSN, RN-BC Admissions RN 04/30/2021 4:47 PM

## 2021-05-01 ENCOUNTER — Observation Stay (HOSPITAL_COMMUNITY): Payer: No Typology Code available for payment source

## 2021-05-01 DIAGNOSIS — Z66 Do not resuscitate: Secondary | ICD-10-CM | POA: Diagnosis present

## 2021-05-01 DIAGNOSIS — J441 Chronic obstructive pulmonary disease with (acute) exacerbation: Secondary | ICD-10-CM

## 2021-05-01 DIAGNOSIS — Z86718 Personal history of other venous thrombosis and embolism: Secondary | ICD-10-CM | POA: Diagnosis not present

## 2021-05-01 DIAGNOSIS — E039 Hypothyroidism, unspecified: Secondary | ICD-10-CM | POA: Diagnosis present

## 2021-05-01 DIAGNOSIS — F32A Depression, unspecified: Secondary | ICD-10-CM

## 2021-05-01 DIAGNOSIS — N179 Acute kidney failure, unspecified: Secondary | ICD-10-CM | POA: Diagnosis present

## 2021-05-01 DIAGNOSIS — E1122 Type 2 diabetes mellitus with diabetic chronic kidney disease: Secondary | ICD-10-CM | POA: Diagnosis present

## 2021-05-01 DIAGNOSIS — Z794 Long term (current) use of insulin: Secondary | ICD-10-CM

## 2021-05-01 DIAGNOSIS — J449 Chronic obstructive pulmonary disease, unspecified: Secondary | ICD-10-CM | POA: Diagnosis not present

## 2021-05-01 DIAGNOSIS — E1140 Type 2 diabetes mellitus with diabetic neuropathy, unspecified: Secondary | ICD-10-CM

## 2021-05-01 DIAGNOSIS — N400 Enlarged prostate without lower urinary tract symptoms: Secondary | ICD-10-CM | POA: Diagnosis present

## 2021-05-01 DIAGNOSIS — R0602 Shortness of breath: Secondary | ICD-10-CM

## 2021-05-01 DIAGNOSIS — I130 Hypertensive heart and chronic kidney disease with heart failure and stage 1 through stage 4 chronic kidney disease, or unspecified chronic kidney disease: Secondary | ICD-10-CM | POA: Diagnosis present

## 2021-05-01 DIAGNOSIS — N183 Chronic kidney disease, stage 3 unspecified: Secondary | ICD-10-CM | POA: Diagnosis not present

## 2021-05-01 DIAGNOSIS — Z8673 Personal history of transient ischemic attack (TIA), and cerebral infarction without residual deficits: Secondary | ICD-10-CM | POA: Diagnosis not present

## 2021-05-01 DIAGNOSIS — N1831 Chronic kidney disease, stage 3a: Secondary | ICD-10-CM | POA: Diagnosis present

## 2021-05-01 DIAGNOSIS — J9601 Acute respiratory failure with hypoxia: Principal | ICD-10-CM

## 2021-05-01 DIAGNOSIS — Z7902 Long term (current) use of antithrombotics/antiplatelets: Secondary | ICD-10-CM | POA: Diagnosis not present

## 2021-05-01 DIAGNOSIS — M109 Gout, unspecified: Secondary | ICD-10-CM | POA: Diagnosis present

## 2021-05-01 DIAGNOSIS — E1151 Type 2 diabetes mellitus with diabetic peripheral angiopathy without gangrene: Secondary | ICD-10-CM | POA: Diagnosis present

## 2021-05-01 DIAGNOSIS — E1165 Type 2 diabetes mellitus with hyperglycemia: Secondary | ICD-10-CM | POA: Diagnosis present

## 2021-05-01 DIAGNOSIS — E875 Hyperkalemia: Secondary | ICD-10-CM | POA: Diagnosis present

## 2021-05-01 DIAGNOSIS — I5032 Chronic diastolic (congestive) heart failure: Secondary | ICD-10-CM | POA: Diagnosis present

## 2021-05-01 DIAGNOSIS — Z20822 Contact with and (suspected) exposure to covid-19: Secondary | ICD-10-CM | POA: Diagnosis present

## 2021-05-01 DIAGNOSIS — G4700 Insomnia, unspecified: Secondary | ICD-10-CM | POA: Diagnosis present

## 2021-05-01 DIAGNOSIS — J329 Chronic sinusitis, unspecified: Secondary | ICD-10-CM | POA: Diagnosis present

## 2021-05-01 DIAGNOSIS — E785 Hyperlipidemia, unspecified: Secondary | ICD-10-CM | POA: Diagnosis present

## 2021-05-01 DIAGNOSIS — J309 Allergic rhinitis, unspecified: Secondary | ICD-10-CM | POA: Diagnosis present

## 2021-05-01 DIAGNOSIS — I739 Peripheral vascular disease, unspecified: Secondary | ICD-10-CM

## 2021-05-01 LAB — BASIC METABOLIC PANEL
Anion gap: 10 (ref 5–15)
BUN: 60 mg/dL — ABNORMAL HIGH (ref 8–23)
CO2: 20 mmol/L — ABNORMAL LOW (ref 22–32)
Calcium: 9.9 mg/dL (ref 8.9–10.3)
Chloride: 107 mmol/L (ref 98–111)
Creatinine, Ser: 1.84 mg/dL — ABNORMAL HIGH (ref 0.61–1.24)
GFR, Estimated: 38 mL/min — ABNORMAL LOW (ref >60)
Glucose, Bld: 266 mg/dL — ABNORMAL HIGH (ref 70–99)
Potassium: 6.4 mmol/L (ref 3.5–5.1)
Sodium: 137 mmol/L (ref 135–145)

## 2021-05-01 LAB — POTASSIUM: Potassium: 6.4 mmol/L (ref 3.5–5.1)

## 2021-05-01 LAB — GLUCOSE, CAPILLARY
Glucose-Capillary: 274 mg/dL — ABNORMAL HIGH (ref 70–99)
Glucose-Capillary: 230 mg/dL — ABNORMAL HIGH (ref 70–99)
Glucose-Capillary: 298 mg/dL — ABNORMAL HIGH (ref 70–99)
Glucose-Capillary: 234 mg/dL — ABNORMAL HIGH (ref 70–99)
Glucose-Capillary: 365 mg/dL — ABNORMAL HIGH (ref 70–99)

## 2021-05-01 LAB — CBC
HCT: 37.9 % — ABNORMAL LOW (ref 39.0–52.0)
Hemoglobin: 11.9 g/dL — ABNORMAL LOW (ref 13.0–17.0)
MCH: 28.6 pg (ref 26.0–34.0)
MCHC: 31.4 g/dL (ref 30.0–36.0)
MCV: 91.1 fL (ref 80.0–100.0)
Platelets: 225 10*3/uL (ref 150–400)
RBC: 4.16 MIL/uL — ABNORMAL LOW (ref 4.22–5.81)
RDW: 15 % (ref 11.5–15.5)
WBC: 13.4 10*3/uL — ABNORMAL HIGH (ref 4.0–10.5)
nRBC: 0 % (ref 0.0–0.2)

## 2021-05-01 MED ORDER — FLUTICASONE PROPIONATE 50 MCG/ACT NA SUSP
2.0000 | Freq: Two times a day (BID) | NASAL | Status: DC
  Administered 2021-05-01 – 2021-05-02 (×2): 2 via NASAL
  Filled 2021-05-01: qty 16

## 2021-05-01 MED ORDER — INSULIN ASPART 100 UNIT/ML IJ SOLN
7.0000 [IU] | Freq: Once | INTRAMUSCULAR | Status: AC
  Administered 2021-05-01: 7 [IU] via SUBCUTANEOUS

## 2021-05-01 MED ORDER — SODIUM ZIRCONIUM CYCLOSILICATE 10 G PO PACK
10.0000 g | PACK | Freq: Three times a day (TID) | ORAL | Status: AC
  Administered 2021-05-01 (×3): 10 g via ORAL
  Filled 2021-05-01 (×3): qty 1

## 2021-05-01 MED ORDER — DEXTROSE 50 % IV SOLN
1.0000 | Freq: Once | INTRAVENOUS | Status: AC
  Administered 2021-05-01: 50 mL via INTRAVENOUS
  Filled 2021-05-01: qty 50

## 2021-05-01 MED ORDER — CALCIUM GLUCONATE-NACL 1-0.675 GM/50ML-% IV SOLN
1.0000 g | Freq: Once | INTRAVENOUS | Status: AC
  Administered 2021-05-01: 1000 mg via INTRAVENOUS
  Filled 2021-05-01: qty 50

## 2021-05-01 MED ORDER — INSULIN ASPART 100 UNIT/ML IV SOLN
10.0000 [IU] | Freq: Once | INTRAVENOUS | Status: AC
  Administered 2021-05-01: 10 [IU] via INTRAVENOUS

## 2021-05-01 MED ORDER — GUAIFENESIN-DM 100-10 MG/5ML PO SYRP: 5.0000 mL | ORAL_SOLUTION | ORAL | Status: DC | PRN

## 2021-05-01 MED ORDER — IPRATROPIUM-ALBUTEROL 0.5-2.5 (3) MG/3ML IN SOLN
3.0000 mL | Freq: Four times a day (QID) | RESPIRATORY_TRACT | Status: DC
  Administered 2021-05-01 – 2021-05-02 (×4): 3 mL via RESPIRATORY_TRACT
  Filled 2021-05-01 (×4): qty 3

## 2021-05-01 MED ORDER — OXYMETAZOLINE HCL 0.05 % NA SOLN
1.0000 | Freq: Two times a day (BID) | NASAL | Status: DC
  Administered 2021-05-01 – 2021-05-02 (×3): 1 via NASAL
  Filled 2021-05-01: qty 15

## 2021-05-01 MED ORDER — METHYLPREDNISOLONE SODIUM SUCC 40 MG IJ SOLR
40.0000 mg | Freq: Every day | INTRAMUSCULAR | Status: DC
  Administered 2021-05-02: 40 mg via INTRAVENOUS
  Filled 2021-05-01: qty 1

## 2021-05-01 MED ORDER — AZITHROMYCIN 250 MG PO TABS
500.0000 mg | ORAL_TABLET | Freq: Every day | ORAL | Status: DC
  Administered 2021-05-01 – 2021-05-02 (×2): 500 mg via ORAL
  Filled 2021-05-01 (×2): qty 2

## 2021-05-01 NOTE — Progress Notes (Signed)
Spoke with patient's daughter, Raquel Sarna. Explained that even though the home O2 has been delivered to the home patient would not be discharged today due to elevated potassium and continued work of breathing. Labs will be rechecked in the morning.

## 2021-05-01 NOTE — Progress Notes (Addendum)
Critical Result: K+ 6.4 Time Notified: 8:49 Provider Notified: Dr. Cathlean Sauer Action Taken: New orders placed

## 2021-05-01 NOTE — Progress Notes (Signed)
PROGRESS NOTE    Sean Saunders  WGN:[id number] DOB: 1946/06/05 DOA: 04/30/2021 PCP: [place], Betty G, MD    Brief Narrative:  Sean Saunders was admitted to the hospital with working diagnosis of acute hypoxic respiratory failure due to COPD exacerbation.  75 year old male past medical history for COPD, peripheral vascular disease, hypertension, type 2 diabetes mellitus, chronic kidney disease stage III a, and BPH who presented with dyspnea.  Patient has several days of dyspnea, worsening despite symptoms despite bronchodilator therapy at home. He was symptomatic with minimal efforts that prompted him to come to the hospital.  On his initial physical examination his oximetry was 84% on room air, blood pressure 126/68, heart rate 90, respiratory rate 24, oxygen saturation 96% on supplemental oxygen, he had decreased air movement, diffuse expiratory wheezing and coarse upper breath sounds, heart S1-S2, rhythmic, abdomen soft nontender, no lower extremity edema.  Sodium 141, potassium 4.4, chloride 110, bicarb 17, glucose 228, BUN 64, creatinine 2.23, white count 11.4, hemoglobin 11.6, hematocrit 36.2, platelets 222. SARS COVID-19 negative. D-dimer 1.59.  Chest radiograph with no infiltrates.  EKG 85 bpm, normal axis, normal intervals, sinus rhythm, no ST segment T wave changes.  Assessment & Plan:   Principal Problem:   Acute respiratory failure with hypoxia (HCC) Active Problems:   Peripheral vascular disease, unspecified (HCC)   PVD (peripheral vascular disease) (HCC)   Type 2 diabetes mellitus with diabetic neuropathy, unspecified (HCC)   Hypertension with heart disease   COPD (chronic obstructive pulmonary disease) (HCC)   CKD (chronic kidney disease), stage III (HCC)   Depression   1. Acute hypoxemic respiratory failure due to COPD exacerbation. Patient continue to be dyspneic, not yet back to baseline. His oxygenation is 98% on 3 L/min. Poor air movement on lung auscultation.    Continue supplemental 02 per [place] to keep oxygenation 88% or greater, aggressive bronchodilator therapy with duoneb q 6 hrs and as needed q 4 hrs.  Antitussive agents and airway clearing techniques with flutter valve and incentive spirometer.  Systemic sterids with methylprednisolone 40 mg daily and inhaled corticosteroids.  Add oral azithromycin 500 mg daily for 5 days.   For nasal congestion, acute on chronic sinusitis continue flonase bid and add afrin bid for 3 days.  Continue with loratadine.  Mild elevation in D dimer, [place] lower extremities negative for DVT, V/Q scan is pending.   Out of bed to chair tid with meals, PT and OT evaluation.   2. AKI on CKD stage 3a with NEW hyperkalemia.  Renal function with serum cr 1,8 with K at 6,4 and serum bicarbonate at 20. Follow up K still elevated at 6,4.  Plan for hyperkalemia treatment with 1 amp of calcium gluconate, 10 units of IV insulin, along with 3 doses of sodium zirconium.  Discontinue spironolactone.   3. Chronic diastolic heart failure/ HTN. No clinical signs of exacerbation, will continue blood pressure control with carvedilol and amlodipine.   4. T2DM. Dyslipidemia. Continue glucose cover and monitoring with insulin sliding scale and basal insulin.  Continue with rosuvastatin.   5. Hypothyroidism. Continue with levothyroxine.   6. Gout. No signs of exacerbation, continue with allopurinol.   Patient continue to be at high risk for worsening hypoxemia.   Status is: Observation  The patient will require care spanning > 2 midnights and should be moved to inpatient because: IV treatments appropriate due to intensity of illness or inability to take PO  Dispo: The patient is from: Home  Anticipated d/c is to: Home              Patient currently is not medically stable to d/c.   Difficult to place patient No    DVT prophylaxis: enoxaparin   Code Status:   DNR   Family Communication:   no family at the bedside      Subjective: Patient continue to have persistent dyspnea and cough, positive sinus congestion, no nausea or vomiting, no chest pain,. Not yet back to baseline.   Objective: Vitals:   05/01/21 0314 05/01/21 0727 05/01/21 0741 05/01/21 0744  BP: (!) 148/67 (!) 164/70    Pulse: 84 84    Resp: 18 20    Temp: 97.7 F (36.5 C) 97.6 F (36.4 C)    TempSrc: Oral Oral    SpO2: 97% 93% 98% 98%    Intake/Output Summary (Last 24 hours) at 05/01/2021 1246 Last data filed at 05/01/2021 1002 Gross per 24 hour  Intake 1623.6 ml  Output 4025 ml  Net -2401.4 ml   There were no vitals filed for this visit.  Examination:   General: Not in pain or dyspnea, deconditioned  Neurology: Awake and alert, non focal  E ENT: no pallor, no icterus, oral mucosa moist Cardiovascular: No JVD. S1-S2 present, rhythmic, no gallops, rubs, or murmurs. No lower extremity edema. Pulmonary: positive breath sounds bilaterally, decreased air movement, scattered wheezing, rhonchi and rales. Prolonged expiratory phase.  Gastrointestinal. Abdomen soft and non tender Skin. No rashes Musculoskeletal: no joint deformities     Data Reviewed: I have personally reviewed following labs and imaging studies  CBC: Recent Labs  Lab 04/30/21 1343 05/01/21 0803  WBC 11.4* 13.4*  NEUTROABS 5.1  --   HGB 11.6* 11.9*  HCT 36.2* 37.9*  MCV 88.7 91.1  PLT 221 564   Basic Metabolic Panel: Recent Labs  Lab 04/30/21 1343 05/01/21 0803 05/01/21 1004  NA 141 137  --   K 4.4 6.4* 6.4*  CL 110 107  --   CO2 17* 20*  --   GLUCOSE 228* 266*  --   BUN 64* 60*  --   CREATININE 2.23* 1.84*  --   CALCIUM 9.5 9.9  --    GFR: Estimated Creatinine Clearance: 43.4 mL/min (A) (by C-Saunders formula based on SCr of 1.84 mg/dL (H)). Liver Function Tests: Recent Labs  Lab 04/30/21 1343  AST 15  ALT 21  ALKPHOS 75  BILITOT <0.1*  PROT 6.9  ALBUMIN 3.8   No results for input(s): LIPASE, AMYLASE in the last 168 hours. No  results for input(s): AMMONIA in the last 168 hours. Coagulation Profile: No results for input(s): INR, PROTIME in the last 168 hours. Cardiac Enzymes: No results for input(s): CKTOTAL, CKMB, CKMBINDEX, TROPONINI in the last 168 hours. BNP (last 3 results) No results for input(s): PROBNP in the last 8760 hours. HbA1C: No results for input(s): HGBA1C in the last 72 hours. CBG: Recent Labs  Lab 04/30/21 2159 04/30/21 2332 05/01/21 0316 05/01/21 0744 05/01/21 1127  GLUCAP 455* 406* 274* 230* 298*   Lipid Profile: No results for input(s): CHOL, HDL, LDLCALC, TRIG, CHOLHDL, LDLDIRECT in the last 72 hours. Thyroid Function Tests: No results for input(s): TSH, T4TOTAL, FREET4, T3FREE, THYROIDAB in the last 72 hours. Anemia Panel: No results for input(s): VITAMINB12, FOLATE, FERRITIN, TIBC, IRON, RETICCTPCT in the last 72 hours.    Radiology Studies: I have reviewed all of the imaging during this hospital visit personally     Scheduled  Meds: . allopurinol  100 mg Oral BID  . amLODipine  10 mg Oral Daily  . carvedilol  25 mg Oral BID WC  . clopidogrel  75 mg Oral Daily  . dextromethorphan-guaiFENesin  1 tablet Oral BID  . enoxaparin (LOVENOX) injection  40 mg Subcutaneous Q24H  . fluticasone  2 spray Each Nare Daily  . insulin aspart  0-20 Units Subcutaneous TID WC  . insulin glargine  40 Units Subcutaneous QHS  . ipratropium-albuterol  3 mL Nebulization TID  . levothyroxine  150 mcg Oral Q0600  . loratadine  10 mg Oral Daily  . melatonin  10 mg Oral QHS  . methylPREDNISolone (SOLU-MEDROL) injection  125 mg Intravenous Once  . predniSONE  60 mg Oral Q breakfast  . rOPINIRole  0.25 mg Oral QHS  . rosuvastatin  40 mg Oral Daily  . sodium zirconium cyclosilicate  10 Saunders Oral TID  . tamsulosin  0.4 mg Oral QPC supper   Continuous Infusions: . sodium chloride 75 mL/hr at 05/01/21 1113     LOS: 0 days        Suleima Ohlendorf Gerome Apley, MD

## 2021-05-01 NOTE — Progress Notes (Signed)
Bilateral lower extremity venous duplex completed. Refer to "CV Proc" under chart review to view preliminary results.  05/01/2021 9:54 AM Kelby Aline., MHA, RVT, RDCS, RDMS

## 2021-05-02 DIAGNOSIS — J9601 Acute respiratory failure with hypoxia: Secondary | ICD-10-CM | POA: Diagnosis not present

## 2021-05-02 DIAGNOSIS — J329 Chronic sinusitis, unspecified: Secondary | ICD-10-CM | POA: Diagnosis not present

## 2021-05-02 DIAGNOSIS — I119 Hypertensive heart disease without heart failure: Secondary | ICD-10-CM

## 2021-05-02 DIAGNOSIS — N183 Chronic kidney disease, stage 3 unspecified: Secondary | ICD-10-CM | POA: Diagnosis not present

## 2021-05-02 DIAGNOSIS — J441 Chronic obstructive pulmonary disease with (acute) exacerbation: Secondary | ICD-10-CM | POA: Diagnosis not present

## 2021-05-02 LAB — BASIC METABOLIC PANEL
Anion gap: 5 (ref 5–15)
BUN: 59 mg/dL — ABNORMAL HIGH (ref 8–23)
CO2: 24 mmol/L (ref 22–32)
Calcium: 9.8 mg/dL (ref 8.9–10.3)
Chloride: 107 mmol/L (ref 98–111)
Creatinine, Ser: 1.7 mg/dL — ABNORMAL HIGH (ref 0.61–1.24)
GFR, Estimated: 42 mL/min — ABNORMAL LOW (ref >60)
Glucose, Bld: 208 mg/dL — ABNORMAL HIGH (ref 70–99)
Potassium: 5 mmol/L (ref 3.5–5.1)
Sodium: 136 mmol/L (ref 135–145)

## 2021-05-02 LAB — GLUCOSE, CAPILLARY
Glucose-Capillary: 191 mg/dL — ABNORMAL HIGH (ref 70–99)
Glucose-Capillary: 253 mg/dL — ABNORMAL HIGH (ref 70–99)

## 2021-05-02 MED ORDER — GUAIFENESIN-DM 100-10 MG/5ML PO SYRP: 5.0000 mL | ORAL_SOLUTION | Freq: Four times a day (QID) | ORAL | 0 refills | Status: DC | PRN

## 2021-05-02 MED ORDER — SODIUM ZIRCONIUM CYCLOSILICATE 10 G PO PACK
10.0000 g | PACK | Freq: Once | ORAL | Status: AC
  Administered 2021-05-02: 10 g via ORAL
  Filled 2021-05-02: qty 1

## 2021-05-02 MED ORDER — AZITHROMYCIN 500 MG PO TABS: 500.0000 mg | ORAL_TABLET | Freq: Every day | ORAL | 0 refills | Status: AC

## 2021-05-02 MED ORDER — OXYMETAZOLINE HCL 0.05 % NA SOLN: 1.0000 | Freq: Two times a day (BID) | NASAL | 0 refills | Status: AC

## 2021-05-02 MED ORDER — CARVEDILOL 25 MG PO TABS: 25.0000 mg | ORAL_TABLET | Freq: Two times a day (BID) | ORAL | 0 refills | Status: DC

## 2021-05-02 MED ORDER — AMLODIPINE BESYLATE 10 MG PO TABS: 10.0000 mg | ORAL_TABLET | Freq: Every day | ORAL | 0 refills | Status: DC

## 2021-05-02 NOTE — Evaluation (Signed)
Occupational Therapy Evaluation Patient Details Name: Sean Saunders MRN: [id number] DOB: 12-18-1945 Today's Date: 05/02/2021    History of Present Illness SeanSaunders was admitted to the hospital with working diagnosis of acute hypoxic respiratory failure due to COPD exacerbation.     75 year old male past medical history for COPD, peripheral vascular disease, hypertension, type 2 diabetes mellitus, chronic kidney disease stage III a, and BPH who presented with dyspnea.   Clinical Impression   Patient supine in bed on 3 L [place] with o2 sat 99% when therapist entered the room. Patient independent with bed mobility, ambulated in room without DME, donned socks, stood at sink to perform grooming all with supervision only. Patient removed [place] for grooming task and o2 sat 95%. Returned to Psychologist, occupational for RN to give patient medication. o2 sat 94% on RA. Patient has all DME needed at home and intermittent assistance of family and neighbors if needed. Patient reports he is at his baseline in regards to functional tasks and mobility - and in regards to breathing and needing rest breaks. Patient has no OT needs at this time. Left patient in care of RN.     Follow Up Recommendations  No OT follow up    Equipment Recommendations  None recommended by OT    Recommendations for Other Services       Precautions / Restrictions Precautions Precautions: Fall Precaution Comments: reports a recent fall at home resulting in broken toes and sprained wrist Restrictions Weight Bearing Restrictions: No      Mobility Bed Mobility Overal bed mobility: Modified Independent                  Transfers Overall transfer level: Needs assistance Equipment used: None Transfers: Sit to/from Stand Sit to Stand: Supervision         General transfer comment: Supervision for in room ambulation on 3 L [place]. Needs intermittent hand hold to steady himself    Balance Overall balance assessment: Mild deficits observed,  not formally tested                                         ADL either performed or assessed with clinical judgement   ADL Overall ADL's : At baseline                                       General ADL Comments: needs increased time and resting breaks at baseline.     Vision Patient Visual Report: No change from baseline       Perception     Praxis      Pertinent Vitals/Pain Pain Assessment: No/denies pain     Hand Dominance Right   Extremity/Trunk Assessment Upper Extremity Assessment Upper Extremity Assessment: Overall WFL for tasks assessed   Lower Extremity Assessment Lower Extremity Assessment: Defer to PT evaluation   Cervical / Trunk Assessment Cervical / Trunk Assessment: Normal   Communication Communication Communication: No difficulties   Cognition Arousal/Alertness: Awake/alert Behavior During Therapy: WFL for tasks assessed/performed Overall Cognitive Status: Within Functional Limits for tasks assessed                                     General Comments  Exercises     Shoulder Instructions      Home Living Family/patient expects to be discharged to:: Private residence Living Arrangements: Alone Available Help at Discharge: Family;Available PRN/intermittently Type of Home: House Home Access: Level entry     Home Layout: One level     Bathroom Shower/Tub: Teacher, early years/pre: Standard     Home Equipment: Environmental consultant - 2 wheels;Shower seat;Grab bars - tub/shower   Additional Comments: has cats. neighbors check in on him.      Prior Functioning/Environment Level of Independence: Independent                 OT Problem List: Cardiopulmonary status limiting activity      OT Treatment/Interventions:      OT Goals(Current goals can be found in the care plan section) Acute Rehab OT Goals OT Goal Formulation: All assessment and education complete, DC therapy  OT  Frequency:     Barriers to D/C:            Co-evaluation              AM-PAC OT "6 Clicks" Daily Activity     Outcome Measure Help from another person eating meals?: None Help from another person taking care of personal grooming?: None Help from another person toileting, which includes using toliet, bedpan, or urinal?: None Help from another person bathing (including washing, rinsing, drying)?: None Help from another person to put on and taking off regular upper body clothing?: None Help from another person to put on and taking off regular lower body clothing?: None 6 Click Score: 24   End of Session Equipment Utilized During Treatment: Oxygen Nurse Communication: Mobility status  Activity Tolerance: Patient tolerated treatment well Patient left: in chair;with call bell/phone within reach  OT Visit Diagnosis: Unsteadiness on feet (R26.81)                Time: 0762-2633 OT Time Calculation (min): 18 min Charges:  OT General Charges $OT Visit: 1 Visit OT Evaluation $OT Eval Low Complexity: 1 Low  Sean Saunders, OTR/L [place]  Office [phone] Pager: [phone]   Sean Saunders 05/02/2021, 9:10 AM

## 2021-05-02 NOTE — Discharge Summary (Addendum)
Physician Discharge Summary  Sean Saunders [id number] DOB: 02-24-1946 DOA: 04/30/2021  PCP: Martinique, Betty G, MD  Admit date: 04/30/2021 Discharge date: 05/02/2021  Admitted From: home  Disposition:  Home   Recommendations for Outpatient Follow-up and new medication changes:  1. Follow up with Dr. Martinique in 7 to 10 days.  2. Follow up with Dr. Lamonte Sakai from Pulmonary as scheduled.  3. Continue with afrin for 2 more days.  4. Continue with bronchodilator therapy.  5. Discontinue spironolactone and benazepril due to hyperkalemia 6. Started on carvedilol for blood pressure control.  7. If persistent exacerbations to consider inhaled corticosteroids.   Home Health: no   Equipment/Devices: home 02  Discharge Condition: stable  CODE STATUS: DNR  Diet recommendation: heart healthy and diabetic prudent.   Brief/Interim Summary: Sean Saunders was admitted to the hospital with working diagnosis of acute hypoxic respiratory failure due to COPD exacerbation.  75 year old male past medical history for COPD, peripheral vascular disease, hypertension, type 2 diabetes mellitus, chronic kidney disease stage III a, and BPH who presented with dyspnea.  Patient has several days of dyspnea, worsening despite symptoms despite bronchodilator therapy at home. He was symptomatic with minimal efforts that prompted him to come to the hospital.  On his initial physical examination his oximetry was 84% on room air, blood pressure 126/68, heart rate 90, respiratory rate 24, oxygen saturation 96% on supplemental oxygen, he had decreased air movement, diffuse expiratory wheezing and coarse upper breath sounds, heart S1-S2, rhythmic, abdomen soft nontender, no lower extremity edema.  Sodium 141, potassium 4.4, chloride 110, bicarb 17, glucose 228, BUN 64, creatinine 2.23, white count 11.4, hemoglobin 11.6, hematocrit 36.2, platelets 222. SARS COVID-19 negative. D-dimer 1.59.  Chest radiograph with no  infiltrates.  EKG 85 bpm, normal axis, normal intervals, sinus rhythm, no ST segment T wave changes.  Patient has placed on supplemental 02, received aggressive bronchodilator therapy and systemic corticosteroids.   Plan to follow up as outpatient with pulmonary and primary care.   1.  Acute hypoxemic respiratory failure due to COPD exacerbation.  Patient was admitted to the medical ward, he was placed on a remote telemetry monitor.    Received supplemental oxygen per nasal cannula with good toleration. He was placed on aggressive bronchodilator therapy, systemic corticosteroids, antitussive agents, airway clearing techniques and nasal decongestants. Oral azithromycin 500 mg daily.  Patient's condition improved, and plan is to continue bronchodilator therapy at home.  He did receive 3 days of systemic corticosteroids while hospitalized.  Considering his significant improvement no further steroids will be prescribed.  Continue nasal decongestant with Flonase and 2 more days of Afrin  Patient had mild elevation of D-dimer 1.59, further work-up with bilateral lower extremity ultrasonography was negative for deep vein thrombosis.  CT angiography was not performed to avoid contrast exposure in the setting of reduced GFR. At this point pretest probability for pulmonary embolism is very low, I discussed this with Sean Saunders and he agrees and holding further work-up.  Patient will follow-up with Dr. Lamonte Sakai as an outpatient in the pulmonary clinic.  2.  Acute kidney injury on chronic kidney disease stage IIIa, complicated with hyperkalemia.  Patient received supportive medical therapy, his potassium reached 6.4 which was confirmed with repeat testing.  He received hyperkalemia treatment with calcium gluconate, intravenous insulin and sodium zirconium.  At discharge his potassium is 5.0 with a serum creatinine of 1.70, BUN 59, and HCO3 of 24. Will discontinue spironolactone and benazepril, and  recommend follow-up kidney function  as an outpatient  3.  Chronic diastolic heart failure, hypertension.  No signs of acute heart failure exacerbation.  Continue blood pressure control with carvedilol, amlodipine and hydrochlorothiazide. Holding on benazepril due to hyperkalemia.  Continue furosemide for diuresis as outpatient regimen.  4.  Type 2 diabetes mellitus, uncontrolled hyperglycemia. Dyslipidemia patient received insulin sliding scale along with basal insulin for glucose control. Patient received high doses of systemic corticosteroids during his hospitalization.  At discharge his fasting glucose is 208. Continue insulin regimen, empagliflozin and metformin.   Continue statin therapy.   5.  Hypothyroidism.  Continue levothyroxine.  6.  Gout.  No signs of exacerbation, continue allopurinol.   7. BPH. No urinary retention, continue with tamsulosin.   Discharge Diagnoses:  Principal Problem:   Acute respiratory failure with hypoxia (HCC) Active Problems:   Peripheral vascular disease, unspecified (HCC)   Type 2 diabetes mellitus with diabetic neuropathy, unspecified (HCC)   Hypertension with heart disease   COPD (chronic obstructive pulmonary disease) (HCC)   CKD (chronic kidney disease), stage III (HCC)   Chronic sinusitis   Depression    Discharge Instructions   Allergies as of 05/02/2021      Reactions   Eszopiclone Itching   Niaspan [niacin Er] Nausea And Vomiting, Rash   Other Hives, Nausea And Vomiting   Ozempic and       Medication List    STOP taking these medications   amLODipine-benazepril 10-40 MG capsule Commonly known as: LOTREL   irbesartan 150 MG tablet Commonly known as: Avapro   spironolactone 25 MG tablet Commonly known as: ALDACTONE     TAKE these medications   albuterol 108 (90 Base) MCG/ACT inhaler Commonly known as: VENTOLIN HFA Inhale 2 puffs into the lungs every 6 (six) hours as needed for wheezing or shortness of breath.    allopurinol 100 MG tablet Commonly known as: ZYLOPRIM Take 100 mg by mouth 2 (two) times daily.   amLODipine 10 MG tablet Commonly known as: NORVASC Take 1 tablet (10 mg total) by mouth daily. Start taking on: May 03, 2021   azithromycin 500 MG tablet Commonly known as: ZITHROMAX Take 1 tablet (500 mg total) by mouth daily for 4 days.   carvedilol 25 MG tablet Commonly known as: COREG Take 1 tablet (25 mg total) by mouth 2 (two) times daily with a meal.   cetirizine 10 MG tablet Commonly known as: ZYRTEC TAKE 1 TABLET(10 MG) BY MOUTH DAILY   cholecalciferol 25 MCG (1000 UNIT) tablet Commonly known as: VITAMIN D3 Take 1,000 Units by mouth daily.   clopidogrel 75 MG tablet Commonly known as: PLAVIX Take 75 mg by mouth daily.   empagliflozin 25 MG Tabs tablet Commonly known as: JARDIANCE Take 0.5 tablets by mouth daily.   fluticasone 50 MCG/ACT nasal spray Commonly known as: FLONASE Place 2 sprays into both nostrils daily.   furosemide 20 MG tablet Commonly known as: LASIX Take 20 mg by mouth daily.   guaiFENesin-dextromethorphan 100-10 MG/5ML syrup Commonly known as: ROBITUSSIN DM Take 5 mLs by mouth every 6 (six) hours as needed for cough.   hydrochlorothiazide 25 MG tablet Commonly known as: HYDRODIURIL Take 25 mg by mouth 2 (two) times daily.   insulin regular human CONCENTRATED 500 UNIT/ML kwikpen Commonly known as: HUMULIN R Inject 40-70 Units into the skin 3 (three) times daily as needed (high blood sugar).   ipratropium-albuterol 0.5-2.5 (3) MG/3ML Soln Commonly known as: DUONEB Take 3 mLs by nebulization every 6 (six) hours as  needed (sob & wheezing).   Combivent Respimat 20-100 MCG/ACT Aers respimat Generic drug: Ipratropium-Albuterol Inhale 1 puff into the lungs 2 (two) times daily as needed for wheezing or shortness of breath.   levothyroxine 150 MCG tablet Commonly known as: SYNTHROID Take 150 mcg by mouth daily before breakfast.    metFORMIN 500 MG tablet Commonly known as: GLUCOPHAGE Take 500 mg by mouth 2 (two) times daily with a meal.   multivitamin with minerals Tabs tablet Take 1 tablet by mouth daily.   oxymetazoline 0.05 % nasal spray Commonly known as: AFRIN Place 1 spray into both nostrils 2 (two) times daily for 1 day. Do not use if for more than 3 days.   rOPINIRole 0.25 MG tablet Commonly known as: REQUIP Take 0.25 mg by mouth at bedtime.   rosuvastatin 40 MG tablet Commonly known as: CRESTOR Take 40 mg by mouth daily.   tamsulosin 0.4 MG Caps capsule Commonly known as: FLOMAX Take 0.4 mg by mouth.   vitamin C 500 MG tablet Commonly known as: ASCORBIC ACID Take 500 mg by mouth daily.   vitamin E 180 MG (400 UNITS) capsule Take 400 Units by mouth daily.            Durable Medical Equipment  (From admission, onward)         Start     Ordered   04/30/21 1442  For home use only DME oxygen  Once       Comments: COPD  Question Answer Comment  Length of Need Lifetime   Mode or (Route) Nasal cannula   Liters per Minute 2   Frequency Continuous (stationary and portable oxygen unit needed)   Oxygen delivery system Gas      04/30/21 1442          Follow-up [facility].   Why: will provide home oxygen Contact information: [place] [address] [phone]              Allergies  Allergen Reactions  . Eszopiclone Itching  . Niaspan [Niacin Er] Nausea And Vomiting and Rash  . Other Hives and Nausea And Vomiting    Ozempic and       Procedures/Studies: US RENAL  Result Date: 04/30/2021 CLINICAL DATA:  Acute kidney injury. EXAM: RENAL / URINARY TRACT ULTRASOUND COMPLETE COMPARISON:  CT 12/31/2019 FINDINGS: Right Kidney: Renal measurements: 10.4 x 4.6 x 4.3 cm = volume: 106 mL. No hydronephrosis. 1.3 cm cyst in the mid kidney. Parenchymal echogenicity is normal. No evidence of solid lesion or stone. Left  Kidney: Renal measurements: 11.2 x 5.3 x 4.9 cm = volume: 154 mL. No hydronephrosis. Two small cysts larger in the mid kidney measuring 1.6 cm, smaller measuring 1.1 cm. Parenchymal echogenicity is normal. No evidence of solid lesion or stone. Bladder: Appears normal for degree of bladder distention. Both ureteral jets are visualized. Other: None. IMPRESSION: 1. No hydronephrosis. 2. Small bilateral renal cysts, appearing simple. Electronically Signed   By: Keith Rake M.D.   On: 04/30/2021 21:59   DG Chest Port 1 View  Result Date: 04/30/2021 CLINICAL DATA:  Shortness of breath. EXAM: PORTABLE CHEST 1 VIEW COMPARISON:  September 16, 2020. FINDINGS: The heart size and mediastinal contours are within normal limits. Both lungs are clear. The visualized skeletal structures are unremarkable. IMPRESSION: No active disease. Electronically Signed   By: Marijo Conception M.D.   On: 04/30/2021 14:56   VAS [place] LOWER EXTREMITY  VENOUS (DVT)  Result Date: 05/01/2021  Lower Venous DVT Study Patient Name:  Sean Saunders  Date of Exam:   05/01/2021 Medical Rec #: [id number]        Accession #:    [id number] Date of Birth: 21-Aug-1946        Patient Gender: M Patient Age:   74Y Exam Location:  [facility] Procedure:      VAS [place] LOWER EXTREMITY VENOUS (DVT) Referring Phys: [id number] KELLY A GRIFFITH --------------------------------------------------------------------------------  Indications: SOB, and remote history of DVT.  Limitations: Poor ultrasound/tissue interface. Comparison Study: No prior study Performing Technologist: Maudry Mayhew MHA, RDMS, RVT, RDCS  Examination Guidelines: A complete evaluation includes B-mode imaging, spectral Doppler, color Doppler, and power Doppler as needed of all accessible portions of each vessel. Bilateral testing is considered an integral part of a complete examination. Limited examinations for reoccurring indications may be performed as noted. The reflux portion of the exam  is performed with the patient in reverse Trendelenburg.  +---------+---------------+---------+-----------+----------+--------------+ RIGHT    CompressibilityPhasicitySpontaneityPropertiesThrombus Aging +---------+---------------+---------+-----------+----------+--------------+ CFV      Full           Yes      Yes                                 +---------+---------------+---------+-----------+----------+--------------+ SFJ      Full                                                        +---------+---------------+---------+-----------+----------+--------------+ FV Prox  Full                                                        +---------+---------------+---------+-----------+----------+--------------+ FV Mid   Full                                                        +---------+---------------+---------+-----------+----------+--------------+ FV DistalFull                                                        +---------+---------------+---------+-----------+----------+--------------+ PFV      Full                                                        +---------+---------------+---------+-----------+----------+--------------+ POP      Full           Yes      Yes                                 +---------+---------------+---------+-----------+----------+--------------+  PTV      Full                                                        +---------+---------------+---------+-----------+----------+--------------+ PERO     Full                                                        +---------+---------------+---------+-----------+----------+--------------+   +---------+---------------+---------+-----------+----------+--------------+ LEFT     CompressibilityPhasicitySpontaneityPropertiesThrombus Aging +---------+---------------+---------+-----------+----------+--------------+ CFV      Full           Yes      Yes                                  +---------+---------------+---------+-----------+----------+--------------+ SFJ      Full                                                        +---------+---------------+---------+-----------+----------+--------------+ FV Prox  Full                                                        +---------+---------------+---------+-----------+----------+--------------+ FV Mid   Full                                                        +---------+---------------+---------+-----------+----------+--------------+ FV DistalFull                                                        +---------+---------------+---------+-----------+----------+--------------+ PFV      Full                                                        +---------+---------------+---------+-----------+----------+--------------+ POP      Full           Yes      Yes                                 +---------+---------------+---------+-----------+----------+--------------+ PTV      Full                                                        +---------+---------------+---------+-----------+----------+--------------+  PERO     Full                                                        +---------+---------------+---------+-----------+----------+--------------+     Summary: RIGHT: - There is no evidence of deep vein thrombosis in the lower extremity.  - No cystic structure found in the popliteal fossa.  LEFT: - There is no evidence of deep vein thrombosis in the lower extremity.  - No cystic structure found in the popliteal fossa.  *See table(s) above for measurements and observations. Electronically signed by Deitra Mayo MD on 05/01/2021 at 3:30:18 PM.    Final        Subjective: Patient is feeling better, no nausea or vomiting, no chest pain, dyspnea and sinus congestion is improving.  Out of bed to chair.   Discharge Exam: Vitals:   05/02/21 0910 05/02/21 1004  BP:     Pulse:    Resp:    Temp:    SpO2: 95% 96%   Vitals:   05/01/21 2012 05/02/21 0455 05/02/21 0910 05/02/21 1004  BP: (!) 139/55 133/62    Pulse: 79 69    Resp: 20 20    Temp: 98.6 F (37 C) 97.9 F (36.6 C)    TempSrc: Oral Oral    SpO2: 96% 95% 95% 96%    General: Not in pain or dyspnea.  Neurology: Awake and alert, non focal  E ENT: no pallor, no icterus, oral mucosa moist Cardiovascular: No JVD. S1-S2 present, rhythmic, no gallops, rubs, or murmurs. No lower extremity edema. Pulmonary: positive breath sounds bilaterally, with no wheezing, rhonchi or rales. Gastrointestinal. Abdomen soft and non tender Skin. No rashes Musculoskeletal: no joint deformities   The results of significant diagnostics from this hospitalization (including imaging, microbiology, ancillary and laboratory) are listed below for reference.     Microbiology: Recent Results (from the past 240 hour(s))  Resp Panel by RT-PCR (Flu A&B, Covid) Nasopharyngeal Swab     Status: None   Collection Time: 04/30/21  1:58 PM   Specimen: Nasopharyngeal Swab; Nasopharyngeal(NP) swabs in vial transport medium  Result Value Ref Range Status   SARS Coronavirus 2 by RT PCR NEGATIVE NEGATIVE Final    Comment: (NOTE) SARS-CoV-2 target nucleic acids are NOT DETECTED.  The SARS-CoV-2 RNA is generally detectable in upper respiratory specimens during the acute phase of infection. The lowest concentration of SARS-CoV-2 viral copies this assay can detect is 138 copies/mL. A negative result does not preclude SARS-Cov-2 infection and should not be used as the sole basis for treatment or other patient management decisions. A negative result may occur with  improper specimen collection/handling, submission of specimen other than nasopharyngeal swab, presence of viral mutation(s) within the areas targeted by this assay, and inadequate number of viral copies(<138 copies/mL). A negative result must be combined with clinical  observations, patient history, and epidemiological information. The expected result is Negative.  Fact Sheet for Patients:  EntrepreneurPulse.com.au  Fact Sheet for Healthcare Providers:  IncredibleEmployment.be  This test is no t yet approved or cleared by the [place] FDA and  has been authorized for detection and/or diagnosis of SARS-CoV-2 by FDA under an Emergency Use Authorization (EUA). This EUA will remain  in effect (meaning this test can be used) for the duration of the COVID-19 declaration under  Section 564(b)(1) of the Act, 21 U.S.C.section 360bbb-3(b)(1), unless the authorization is terminated  or revoked sooner.       Influenza A by PCR NEGATIVE NEGATIVE Final   Influenza B by PCR NEGATIVE NEGATIVE Final    Comment: (NOTE) The Xpert Xpress SARS-CoV-2/FLU/RSV plus assay is intended as an aid in the diagnosis of influenza from Nasopharyngeal swab specimens and should not be used as a sole basis for treatment. Nasal washings and aspirates are unacceptable for Xpert Xpress SARS-CoV-2/FLU/RSV testing.  Fact Sheet for Patients: EntrepreneurPulse.com.au  Fact Sheet for Healthcare Providers: IncredibleEmployment.be  This test is not yet approved or cleared by the [place] FDA and has been authorized for detection and/or diagnosis of SARS-CoV-2 by FDA under an Emergency Use Authorization (EUA). This EUA will remain in effect (meaning this test can be used) for the duration of the COVID-19 declaration under Section 564(b)(1) of the Act, 21 U.S.C. section 360bbb-3(b)(1), unless the authorization is terminated or revoked.  Performed at [facility], [place] [address], [place], [place] [address]      Labs: BNP (last 3 results) Recent Labs    06/27/20 0509  BNP 35.5   Basic Metabolic Panel: Recent Labs  Lab 04/30/21 1343 05/01/21 0803 05/01/21 1004 05/02/21 0645   NA 141 137  --  136  K 4.4 6.4* 6.4* 5.0  CL 110 107  --  107  CO2 17* 20*  --  24  GLUCOSE 228* 266*  --  208*  BUN 64* 60*  --  59*  CREATININE 2.23* 1.84*  --  1.70*  CALCIUM 9.5 9.9  --  9.8   Liver Function Tests: Recent Labs  Lab 04/30/21 1343  AST 15  ALT 21  ALKPHOS 75  BILITOT <0.1*  PROT 6.9  ALBUMIN 3.8   No results for input(s): LIPASE, AMYLASE in the last 168 hours. No results for input(s): AMMONIA in the last 168 hours. CBC: Recent Labs  Lab 04/30/21 1343 05/01/21 0803  WBC 11.4* 13.4*  NEUTROABS 5.1  --   HGB 11.6* 11.9*  HCT 36.2* 37.9*  MCV 88.7 91.1  PLT 221 225   Cardiac Enzymes: No results for input(s): CKTOTAL, CKMB, CKMBINDEX, TROPONINI in the last 168 hours. BNP: Invalid input(s): POCBNP CBG: Recent Labs  Lab 05/01/21 0744 05/01/21 1127 05/01/21 1603 05/01/21 2017 05/02/21 0740  GLUCAP 230* 298* 234* 365* 191*   D-Dimer Recent Labs    04/30/21 2055  DDIMER 1.59*   Hgb A1c No results for input(s): HGBA1C in the last 72 hours. Lipid Profile No results for input(s): CHOL, HDL, LDLCALC, TRIG, CHOLHDL, LDLDIRECT in the last 72 hours. Thyroid function studies No results for input(s): TSH, T4TOTAL, T3FREE, THYROIDAB in the last 72 hours.  Invalid input(s): FREET3 Anemia work up No results for input(s): VITAMINB12, FOLATE, FERRITIN, TIBC, IRON, RETICCTPCT in the last 72 hours. Urinalysis    Component Value Date/Time   COLORURINE YELLOW 06/27/2020 [place] 06/27/2020 0547   LABSPEC 1.013 06/27/2020 0547   PHURINE 5.0 06/27/2020 0547   GLUCOSEU >=500 (A) 06/27/2020 0547   HGBUR NEGATIVE 06/27/2020 0547   BILIRUBINUR NEGATIVE 06/27/2020 0547   KETONESUR 5 (A) 06/27/2020 0547   PROTEINUR 100 (A) 06/27/2020 0547   UROBILINOGEN 0.2 07/03/2012 0937   NITRITE NEGATIVE 06/27/2020 0547   LEUKOCYTESUR NEGATIVE 06/27/2020 0547   Sepsis Labs Invalid input(s): PROCALCITONIN,  WBC,  LACTICIDVEN Microbiology Recent  Results (from the past 240 hour(s))  Resp Panel by RT-PCR (  Flu A&B, Covid) Nasopharyngeal Swab     Status: None   Collection Time: 04/30/21  1:58 PM   Specimen: Nasopharyngeal Swab; Nasopharyngeal(NP) swabs in vial transport medium  Result Value Ref Range Status   SARS Coronavirus 2 by RT PCR NEGATIVE NEGATIVE Final    Comment: (NOTE) SARS-CoV-2 target nucleic acids are NOT DETECTED.  The SARS-CoV-2 RNA is generally detectable in upper respiratory specimens during the acute phase of infection. The lowest concentration of SARS-CoV-2 viral copies this assay can detect is 138 copies/mL. A negative result does not preclude SARS-Cov-2 infection and should not be used as the sole basis for treatment or other patient management decisions. A negative result may occur with  improper specimen collection/handling, submission of specimen other than nasopharyngeal swab, presence of viral mutation(s) within the areas targeted by this assay, and inadequate number of viral copies(<138 copies/mL). A negative result must be combined with clinical observations, patient history, and epidemiological information. The expected result is Negative.  Fact Sheet for Patients:  EntrepreneurPulse.com.au  Fact Sheet for Healthcare Providers:  IncredibleEmployment.be  This test is no t yet approved or cleared by the [place] FDA and  has been authorized for detection and/or diagnosis of SARS-CoV-2 by FDA under an Emergency Use Authorization (EUA). This EUA will remain  in effect (meaning this test can be used) for the duration of the COVID-19 declaration under Section 564(b)(1) of the Act, 21 U.S.C.section 360bbb-3(b)(1), unless the authorization is terminated  or revoked sooner.       Influenza A by PCR NEGATIVE NEGATIVE Final   Influenza B by PCR NEGATIVE NEGATIVE Final    Comment: (NOTE) The Xpert Xpress SARS-CoV-2/FLU/RSV plus assay is intended as an aid in the  diagnosis of influenza from Nasopharyngeal swab specimens and should not be used as a sole basis for treatment. Nasal washings and aspirates are unacceptable for Xpert Xpress SARS-CoV-2/FLU/RSV testing.  Fact Sheet for Patients: EntrepreneurPulse.com.au  Fact Sheet for Healthcare Providers: IncredibleEmployment.be  This test is not yet approved or cleared by the [place] FDA and has been authorized for detection and/or diagnosis of SARS-CoV-2 by FDA under an Emergency Use Authorization (EUA). This EUA will remain in effect (meaning this test can be used) for the duration of the COVID-19 declaration under Section 564(b)(1) of the Act, 21 U.S.C. section 360bbb-3(b)(1), unless the authorization is terminated or revoked.  Performed at [facility], [place] [address], [place], [place] [address]      Time coordinating discharge: 45 minutes  SIGNED:   Tawni Millers, MD  [facility] 05/02/2021, 11:09 AM

## 2021-05-02 NOTE — Evaluation (Signed)
Physical Therapy One Time Evaluation Patient Details Name: Sean Saunders MRN: [id number] DOB: 03/17/1946 Today's Date: 05/02/2021   History of Present Illness  Sean Saunders was admitted to the hospital with working diagnosis of acute hypoxic respiratory failure due to COPD exacerbation.     75 year old male past medical history for COPD, peripheral vascular disease, hypertension, type 2 diabetes mellitus, chronic kidney disease stage III a, and BPH who presented with dyspnea.  Clinical Impression  Patient evaluated by Physical Therapy with no further acute PT needs identified. All education has been completed and the patient has no further questions.  See below for any follow-up Physical Therapy or equipment needs. PT is signing off. Thank you for this referral.     Follow Up Recommendations No PT follow up    Equipment Recommendations  None recommended by PT    Recommendations for Other Services       Precautions / Restrictions Precautions Precautions: Fall Precaution Comments: reports a recent fall at home resulting in broken toes and sprained wrist Restrictions Weight Bearing Restrictions: No      Mobility  Bed Mobility Overal bed mobility: Modified Independent             General bed mobility comments: pt in recliner    Transfers Overall transfer level: Needs assistance Equipment used: None Transfers: Sit to/from Stand Sit to Stand: Supervision         General transfer comment: Supervision for in room ambulation on 3 L [place]. Needs intermittent hand hold to steady himself  Ambulation/Gait Ambulation/Gait assistance: Supervision Gait Distance (Feet): 350 Feet Assistive device: IV Pole Gait Pattern/deviations: WFL(Within Functional Limits)     General Gait Details: pt pushed IV pole, denies symptoms, SPO2 96-97% on room air during ambulation  Stairs            Wheelchair Mobility    Modified Rankin (Stroke Patients Only)       Balance Overall  balance assessment: History of Falls                                           Pertinent Vitals/Pain Pain Assessment: No/denies pain    Home Living Family/patient expects to be discharged to:: Private residence Living Arrangements: Alone Available Help at Discharge: Family;Available PRN/intermittently Type of Home: House Home Access: Level entry     Home Layout: One level Home Equipment: Walker - 2 wheels;Shower seat;Grab bars - tub/shower Additional Comments: has cats. neighbors check in on him.    Prior Function Level of Independence: Independent               Hand Dominance   Dominant Hand: Right    Extremity/Trunk Assessment   Upper Extremity Assessment Upper Extremity Assessment: Overall WFL for tasks assessed    Lower Extremity Assessment Lower Extremity Assessment: Overall WFL for tasks assessed    Cervical / Trunk Assessment Cervical / Trunk Assessment: Normal  Communication   Communication: No difficulties  Cognition Arousal/Alertness: Awake/alert Behavior During Therapy: WFL for tasks assessed/performed Overall Cognitive Status: Within Functional Limits for tasks assessed                                        General Comments General comments (skin integrity, edema, etc.): pt reports his falls are usually due to bumping  into furniture which typically happens when blood sugar is off    Exercises     Assessment/Plan    PT Assessment Patent does not need any further PT services  PT Problem List         PT Treatment Interventions      PT Goals (Current goals can be found in the Care Plan section)  Acute Rehab PT Goals PT Goal Formulation: All assessment and education complete, DC therapy    Frequency     Barriers to discharge        Co-evaluation               AM-PAC PT "6 Clicks" Mobility  Outcome Measure Help needed turning from your back to your side while in a flat bed without using  bedrails?: None Help needed moving from lying on your back to sitting on the side of a flat bed without using bedrails?: None Help needed moving to and from a bed to a chair (including a wheelchair)?: None Help needed standing up from a chair using your arms (Sean Saunders.g., wheelchair or bedside chair)?: None Help needed to walk in hospital room?: A Little Help needed climbing 3-5 steps with a railing? : A Little 6 Click Score: 22    End of Session   Activity Tolerance: Patient tolerated treatment well Patient left: in chair;with call bell/phone within reach   PT Visit Diagnosis: Difficulty in walking, not elsewhere classified (R26.2)    Time: 4967-5916 PT Time Calculation (min) (ACUTE ONLY): 12 min   Charges:   PT Evaluation $PT Eval Low Complexity: 1 Low     Sean Saunders PT, DPT Acute Rehabilitation Services Pager: [phone] Office: [phone]  Sean Saunders Sean Saunders 05/02/2021, 12:05 PM

## 2021-05-02 NOTE — Progress Notes (Signed)
Reviewed discharge paperwork, new prescriptions, medication regimen, and follow up appointments with patient and his daughter. Patient declined wheelchair escort to main entrance. Patient released in the care of his daughter.

## 2021-05-04 ENCOUNTER — Telehealth: Payer: Self-pay

## 2021-05-04 NOTE — Telephone Encounter (Signed)
Transition Care Management Unsuccessful Follow-up Telephone Call  Date of discharge and from where:  05/02/2021  Attempts:  1st Attempt  Reason for unsuccessful TCM follow-up call:  Unable to reach patient

## 2021-05-28 DIAGNOSIS — L603 Nail dystrophy: Secondary | ICD-10-CM | POA: Diagnosis not present

## 2021-05-28 DIAGNOSIS — I739 Peripheral vascular disease, unspecified: Secondary | ICD-10-CM | POA: Diagnosis not present

## 2021-05-28 DIAGNOSIS — B3749 Other urogenital candidiasis: Secondary | ICD-10-CM | POA: Diagnosis not present

## 2021-05-28 DIAGNOSIS — L84 Corns and callosities: Secondary | ICD-10-CM | POA: Diagnosis not present

## 2021-05-28 DIAGNOSIS — E1151 Type 2 diabetes mellitus with diabetic peripheral angiopathy without gangrene: Secondary | ICD-10-CM | POA: Diagnosis not present

## 2021-06-01 DIAGNOSIS — J449 Chronic obstructive pulmonary disease, unspecified: Secondary | ICD-10-CM | POA: Diagnosis not present

## 2021-06-10 DIAGNOSIS — B3749 Other urogenital candidiasis: Secondary | ICD-10-CM | POA: Diagnosis not present

## 2021-06-11 ENCOUNTER — Encounter (HOSPITAL_COMMUNITY): Payer: Self-pay | Admitting: Emergency Medicine

## 2021-06-11 ENCOUNTER — Emergency Department (HOSPITAL_COMMUNITY)
Admission: EM | Admit: 2021-06-11 | Discharge: 2021-06-11 | Disposition: A | Discharge status: Home / Self Care | Payer: No Typology Code available for payment source | Attending: Emergency Medicine | Admitting: Emergency Medicine

## 2021-06-11 DIAGNOSIS — R21 Rash and other nonspecific skin eruption: Secondary | ICD-10-CM | POA: Diagnosis present

## 2021-06-11 DIAGNOSIS — Z87891 Personal history of nicotine dependence: Secondary | ICD-10-CM | POA: Insufficient documentation

## 2021-06-11 DIAGNOSIS — J449 Chronic obstructive pulmonary disease, unspecified: Secondary | ICD-10-CM | POA: Insufficient documentation

## 2021-06-11 DIAGNOSIS — Z96649 Presence of unspecified artificial hip joint: Secondary | ICD-10-CM | POA: Insufficient documentation

## 2021-06-11 DIAGNOSIS — E1122 Type 2 diabetes mellitus with diabetic chronic kidney disease: Secondary | ICD-10-CM | POA: Insufficient documentation

## 2021-06-11 DIAGNOSIS — Z79899 Other long term (current) drug therapy: Secondary | ICD-10-CM | POA: Diagnosis not present

## 2021-06-11 DIAGNOSIS — I129 Hypertensive chronic kidney disease with stage 1 through stage 4 chronic kidney disease, or unspecified chronic kidney disease: Secondary | ICD-10-CM | POA: Diagnosis not present

## 2021-06-11 DIAGNOSIS — Z7984 Long term (current) use of oral hypoglycemic drugs: Secondary | ICD-10-CM | POA: Diagnosis not present

## 2021-06-11 DIAGNOSIS — N183 Chronic kidney disease, stage 3 unspecified: Secondary | ICD-10-CM | POA: Insufficient documentation

## 2021-06-11 DIAGNOSIS — Z794 Long term (current) use of insulin: Secondary | ICD-10-CM | POA: Diagnosis not present

## 2021-06-11 DIAGNOSIS — E039 Hypothyroidism, unspecified: Secondary | ICD-10-CM | POA: Insufficient documentation

## 2021-06-11 DIAGNOSIS — Z7902 Long term (current) use of antithrombotics/antiplatelets: Secondary | ICD-10-CM | POA: Diagnosis not present

## 2021-06-11 DIAGNOSIS — Z8673 Personal history of transient ischemic attack (TIA), and cerebral infarction without residual deficits: Secondary | ICD-10-CM | POA: Insufficient documentation

## 2021-06-11 DIAGNOSIS — L309 Dermatitis, unspecified: Secondary | ICD-10-CM

## 2021-06-11 NOTE — ED Triage Notes (Signed)
Pt states he has had yeast on his genitals and has had cream and pills but is worried that he has made his situation worse. Alert and oriented.

## 2021-06-11 NOTE — ED Provider Notes (Signed)
[facility] DEPT Provider Note   CSN: [id number] Arrival date & time: 06/11/21  1656     History Chief Complaint  Patient presents with   Recurrent Skin Infections    Sean Saunders is a 75 y.o. male.  Patient with history of diabetes presents the emergency department for testicular redness and burning pain.  Patient was seen at an urgent care 2 to 3 weeks ago for what was diagnosed as a yeast infection in the perineal area.  Patient took Lotrimin orally and applied a combination steroid, Lotrimin cream once a day with general improvement.  He followed up yesterday.  He had mild residual areas.  He was prescribed additional Diflucan which she has not picked up yet and told to continue topical cream.  He states that he was told to use it more frequently and yesterday he applied 5 times.  Today he noted redness of the scrotum.  No drainage or discharge.  He does have burning sensation in the area.  He applied Vaseline.  He came to the emergency department for a recheck as he is concerned that he "burned" himself with the medication.  Blood sugars in the 100s today.  No fevers, nausea or vomiting.  No difficulties with urination.      Past Medical History:  Diagnosis Date   Arthritis    COPD (chronic obstructive pulmonary disease) ([place])    Diabetes mellitus    DVT (deep venous thrombosis) (H[facility])    GERD (gastroesophageal reflux disease)    Hyperlipidemia    Hypertension    Hypothyroidism    Onychomycosis 06/07/2013   Stroke ([place])    TIA's 01/05/11-01/08/11-01/26/11   TIA (transient ischemic attack) 08/03/2011   Left sided weakness. 3 episodes Feb-March    Patient Active Problem List   Diagnosis Date Noted   Stridor 08/31/2020   Chronic respiratory failure with hypoxia ([place]) 08/31/2020   Depression 07/16/2020   Acute respiratory failure with hypoxia ([facility]) 06/27/2020   Fever    Embolism and thrombosis of arteries of lower extremity ([place]) 01/10/2020    Cholelithiasis 12/31/2019   Acute pancreatitis 12/31/2019   Chronic sinusitis 09/03/2019   CKD (chronic kidney disease), stage III ([place]) 09/17/2018   Meningioma, cerebral ([place]) 09/17/2018   Vestibular schwannoma ([place]) 09/17/2018   Rhinitis, allergic 09/25/2017   Tobacco use disorder 09/11/2017   COPD (chronic obstructive pulmonary disease) ([place]) 09/10/2017   Hypertension with heart disease 09/04/2017   Pain in lower limb 03/21/2014   Type 2 diabetes mellitus with diabetic neuropathy, unspecified ([place]) 03/21/2014   PVD (peripheral vascular disease) ([place]) 12/11/2013   Occlusion and stenosis of carotid artery without mention of cerebral infarction 12/11/2013   Onychomycosis 06/07/2013   Pain in joint, ankle and foot 06/07/2013   Callus of foot 06/07/2013   Peripheral vascular disease, unspecified ([place]) 05/29/2013   Aftercare following surgery of the circulatory system, NEC 05/29/2013   Other postoperative infection 11/13/2012   Neuropathic pain of lower extremity 11/23/2011   Atherosclerosis of native arteries of the extremities with intermittent claudication 10/12/2011    Past Surgical History:  Procedure Laterality Date   ABDOMINAL AORTAGRAM N/A 06/04/2012   Procedure: ABDOMINAL Maxcine Ham;  Surgeon: Angelia Mould, MD;  Location: [facility] CATH LAB;  Service: Cardiovascular;  Laterality: N/A;   APPENDECTOMY     COLONOSCOPY     FEMORAL-POPLITEAL BYPASS GRAFT  07/05/2012   Procedure: BYPASS GRAFT FEMORAL-POPLITEAL ARTERY;  Surgeon: Angelia Mould, MD;  Location: [facility];  Service: Vascular;  Laterality: Left;  Revision Left fem/pop w/ artery patch angioplasty utilizing left GSV graft and extenion to tib/per trunk w/right GSV and interop arteriogram   JOINT REPLACEMENT  2011   -Hip   NASAL SINUS SURGERY     PR VEIN BYPASS GRAFT,AORTO-FEM-POP     PR VEIN BYPASS GRAFT,AORTO-FEM-POP  08-01-11   (L) Fem-pop BP   toe nail     TONSILLECTOMY         Family History  Problem  Relation Age of Onset   Stroke Mother    Diabetes Mother    Other Father        alzheimers   Diabetes Father    Stroke Father    Diabetes Sister     Social History   Tobacco Use   Smoking status: Former    Packs/day: 1.00    Years: 25.00    Pack years: 25.00    Types: Cigarettes    Quit date: 06/05/2018    Years since quitting: 3.0   Smokeless tobacco: Never  Vaping Use   Vaping Use: Never used  Substance Use Topics   Alcohol use: Not Currently    Alcohol/week: 0.0 standard drinks    Comment: rare   Drug use: No    Home Medications Prior to Admission medications   Medication Sig Start Date End Date Taking? Authorizing Provider  albuterol (VENTOLIN HFA) 108 (90 Base) MCG/ACT inhaler Inhale 2 puffs into the lungs every 6 (six) hours as needed for wheezing or shortness of breath.    [provider]  allopurinol (ZYLOPRIM) 100 MG tablet Take 100 mg by mouth 2 (two) times daily.    [provider]  amLODipine (NORVASC) 10 MG tablet Take 1 tablet (10 mg total) by mouth daily. 05/03/21 06/02/21  Arrien, Jimmy Picket, MD  carvedilol (COREG) 25 MG tablet Take 1 tablet (25 mg total) by mouth 2 (two) times daily with a meal. 05/02/21 06/01/21  Arrien, Jimmy Picket, MD  cetirizine (ZYRTEC) 10 MG tablet TAKE 1 TABLET(10 MG) BY MOUTH DAILY 01/11/21   Collene Gobble, MD  cholecalciferol (VITAMIN D3) 25 MCG (1000 UNIT) tablet Take 1,000 Units by mouth daily.    [provider]  clopidogrel (PLAVIX) 75 MG tablet Take 75 mg by mouth daily.    [provider]  empagliflozin (JARDIANCE) 25 MG TABS tablet Take 0.5 tablets by mouth daily. 03/02/21 03/03/22  [provider]  fluticasone (FLONASE) 50 MCG/ACT nasal spray Place 2 sprays into both nostrils daily. 08/07/19   [place], Betty G, MD  furosemide (LASIX) 20 MG tablet Take 20 mg by mouth daily.    [provider]  guaiFENesin-dextromethorphan (ROBITUSSIN DM) 100-10 MG/5ML syrup Take 5 mLs by  mouth every 6 (six) hours as needed for cough. 05/02/21   Arrien, Jimmy Picket, MD  hydrochlorothiazide (HYDRODIURIL) 25 MG tablet Take 25 mg by mouth 2 (two) times daily. 06/07/20   [provider]  insulin regular human CONCENTRATED (HUMULIN R) 500 UNIT/ML kwikpen Inject 40-70 Units into the skin 3 (three) times daily as needed (high blood sugar). 03/02/21   [provider]  Ipratropium-Albuterol (COMBIVENT RESPIMAT) 20-100 MCG/ACT AERS respimat Inhale 1 puff into the lungs 2 (two) times daily as needed for wheezing or shortness of breath.    [provider]  ipratropium-albuterol (DUONEB) 0.5-2.5 (3) MG/3ML SOLN Take 3 mLs by nebulization every 6 (six) hours as needed (sob & wheezing).    [provider]  levothyroxine (SYNTHROID, LEVOTHROID) 150 MCG tablet  Take 150 mcg by mouth daily before breakfast.    [provider]  metFORMIN (GLUCOPHAGE) 500 MG tablet Take 500 mg by mouth 2 (two) times daily with a meal.    [provider]  Multiple Vitamin (MULTIVITAMIN WITH MINERALS) TABS tablet Take 1 tablet by mouth daily.    [provider]  rOPINIRole (REQUIP) 0.25 MG tablet Take 0.25 mg by mouth at bedtime.    [provider]  rosuvastatin (CRESTOR) 40 MG tablet Take 40 mg by mouth daily. 04/06/20   [provider]  tamsulosin (FLOMAX) 0.4 MG CAPS capsule Take 0.4 mg by mouth.    [provider]  vitamin C (ASCORBIC ACID) 500 MG tablet Take 500 mg by mouth daily.    [provider]  vitamin E 180 MG (400 UNITS) capsule Take 400 Units by mouth daily.    [provider]    Allergies    Eszopiclone, Niaspan [niacin er], and Other  Review of Systems   Review of Systems  Constitutional:  Negative for fever.  Gastrointestinal:  Negative for nausea and vomiting.  Genitourinary:  Negative for dysuria.  Skin:  Positive for rash.   Physical Exam Updated Vital Signs BP (!) 142/71 (BP Location:  Left Arm)   Pulse 90   Temp 98 F (36.7 C) (Oral)   Resp 18   Ht 6' (1.829 m)   Wt 99.8 kg   SpO2 96%   BMI 29.84 kg/m   Physical Exam Vitals and nursing note reviewed.  Constitutional:      Appearance: He is well-developed.  HENT:     Head: Normocephalic and atraumatic.  Eyes:     Conjunctiva/sclera: Conjunctivae normal.  Pulmonary:     Effort: No respiratory distress.  Musculoskeletal:     Cervical back: Normal range of motion and neck supple.  Skin:    General: Skin is warm and dry.     Comments: Patient with erythema of the scrotum.  It appears more allergic in nature than infectious.  No drainage or signs of abscess.  Redness is very well-circumscribed.  No significant tenderness to palpation but he does report burning pain and irritation.  Neurological:     Mental Status: He is alert.    ED Results / Procedures / Treatments   Labs (all labs ordered are listed, but only abnormal results are displayed) Labs Reviewed - No data to display  EKG None  Radiology No results found.  Procedures Procedures   Medications Ordered in ED Medications - No data to display  ED Course  I have reviewed the triage vital signs and the nursing notes.  Pertinent labs & imaging results that were available during my care of the patient were reviewed by me and considered in my medical decision making (see chart for details).  Patient seen and examined.  Blood sugar in 140s.  Advised stopping the topical cream.  Will be up to him whether he wants to take the oral course of Diflucan which she has not yet filled.  Encouraged him to keep the area dry.  He may apply Vaseline if this helps sooth the burning if he desires.  Encouraged to return with worsening pain, swelling, fevers, uncontrolled blood sugar or other concerns.  Vital signs reviewed and are as follows: BP (!) 142/71 (BP Location: Left Arm)   Pulse 90   Temp 98 F (36.7 C) (Oral)   Resp 18   Ht 6' (1.829 m)   Wt  99.8 kg  SpO2 96%   BMI 29.84 kg/m   Prior to discharge, discussed with Dr. Laverta Baltimore, who agrees with plan.   MDM Rules/Calculators/A&P                          Patient with suspected contact dermatitis due to medication overuse.  Low concern for cellulitis at this time.  No signs of Fournier's gangrene's, abscess.   Final Clinical Impression(s) / ED Diagnoses Final diagnoses:  Dermatitis    Rx / DC Orders ED Discharge Orders     None        Carlisle Cater, Hershal Coria 06/11/21 2122    Margette Fast, MD 06/11/21 2348

## 2021-06-11 NOTE — Discharge Instructions (Signed)
Please read and follow all provided instructions.  Your diagnoses today include:  1. Dermatitis     Tests performed today include: Vital signs. See below for your results today.   Medications prescribed:  None  Take any prescribed medications only as directed.   Home care instructions:  Avoid use of the prescription topical medication prescribed to you as this could be worsening your symptoms.  Give it a few days for the skin to calm down.  You may apply Vaseline or other barrier ointments as needed for symptoms if desired, otherwise keep area dry.  Follow-up instructions: Please follow-up with your primary care provider in the next 1 week for further evaluation of your symptoms.   Return instructions:  Return to the Emergency Department if you have: Fever Worsening symptoms Worsening pain Worsening swelling Redness of the skin that moves away from the affected area, especially if it streaks away from the affected area  Any other emergent concerns  Your vital signs today were: BP (!) 142/71 (BP Location: Left Arm)   Pulse 90   Temp 98 F (36.7 C) (Oral)   Resp 18   Ht 6' (1.829 m)   Wt 99.8 kg   SpO2 96%   BMI 29.84 kg/m  If your blood pressure (BP) was elevated above 135/85 this visit, please have this repeated by your doctor within one month. --------------

## 2021-06-11 NOTE — ED Provider Notes (Signed)
Emergency Medicine Provider Triage Evaluation Note  Sean Saunders , a 75 y.o. male  was evaluated in triage.  Pt complains of yeast infection on left testicle.  This has been going on for about 2 weeks.  Was on a 2-week course of oral medication for yeast infection.  Started using Lotrimin cream but has been using it 4 times a day instead of once a day and feels that this worsened his symptoms.  No fevers.  He has a history of uncontrolled diabetes.  Review of Systems  Positive: Rash Negative: Fever  Physical Exam  BP (!) 142/71 (BP Location: Left Arm)   Pulse 90   Temp 98 F (36.7 C) (Oral)   Resp 18   SpO2 96%  Gen:   Awake, no distress   Resp:  Normal effort  MSK:   Moves extremities without difficulty  Other:    Medical Decision Making  Medically screening exam initiated at 5:50 PM.  Appropriate orders placed.  [name] was informed that the remainder of the evaluation will be completed by another provider, this initial triage assessment does not replace that evaluation, and the importance of remaining in the ED until their evaluation is complete.  Will need physical exam   Delia Heady, PA-C 06/11/21 1752    Lucrezia Starch, MD 06/12/21 1556

## 2021-06-13 ENCOUNTER — Emergency Department (HOSPITAL_COMMUNITY)
Admission: EM | Admit: 2021-06-13 | Discharge: 2021-06-13 | Disposition: A | Discharge status: Home / Self Care | Payer: Medicare PPO | Attending: Emergency Medicine | Admitting: Emergency Medicine

## 2021-06-13 ENCOUNTER — Encounter (HOSPITAL_COMMUNITY): Payer: Self-pay

## 2021-06-13 ENCOUNTER — Other Ambulatory Visit: Payer: Self-pay

## 2021-06-13 DIAGNOSIS — E1122 Type 2 diabetes mellitus with diabetic chronic kidney disease: Secondary | ICD-10-CM | POA: Insufficient documentation

## 2021-06-13 DIAGNOSIS — Z7984 Long term (current) use of oral hypoglycemic drugs: Secondary | ICD-10-CM | POA: Insufficient documentation

## 2021-06-13 DIAGNOSIS — E039 Hypothyroidism, unspecified: Secondary | ICD-10-CM | POA: Diagnosis not present

## 2021-06-13 DIAGNOSIS — R21 Rash and other nonspecific skin eruption: Secondary | ICD-10-CM | POA: Diagnosis present

## 2021-06-13 DIAGNOSIS — Z87891 Personal history of nicotine dependence: Secondary | ICD-10-CM | POA: Insufficient documentation

## 2021-06-13 DIAGNOSIS — Z794 Long term (current) use of insulin: Secondary | ICD-10-CM | POA: Diagnosis not present

## 2021-06-13 DIAGNOSIS — J449 Chronic obstructive pulmonary disease, unspecified: Secondary | ICD-10-CM | POA: Diagnosis not present

## 2021-06-13 DIAGNOSIS — L249 Irritant contact dermatitis, unspecified cause: Secondary | ICD-10-CM | POA: Diagnosis not present

## 2021-06-13 DIAGNOSIS — Z96649 Presence of unspecified artificial hip joint: Secondary | ICD-10-CM | POA: Diagnosis not present

## 2021-06-13 DIAGNOSIS — N183 Chronic kidney disease, stage 3 unspecified: Secondary | ICD-10-CM | POA: Diagnosis not present

## 2021-06-13 DIAGNOSIS — Z79899 Other long term (current) drug therapy: Secondary | ICD-10-CM | POA: Diagnosis not present

## 2021-06-13 DIAGNOSIS — I129 Hypertensive chronic kidney disease with stage 1 through stage 4 chronic kidney disease, or unspecified chronic kidney disease: Secondary | ICD-10-CM | POA: Diagnosis not present

## 2021-06-13 DIAGNOSIS — Z7902 Long term (current) use of antithrombotics/antiplatelets: Secondary | ICD-10-CM | POA: Insufficient documentation

## 2021-06-13 DIAGNOSIS — Z7951 Long term (current) use of inhaled steroids: Secondary | ICD-10-CM | POA: Diagnosis not present

## 2021-06-13 DIAGNOSIS — E1140 Type 2 diabetes mellitus with diabetic neuropathy, unspecified: Secondary | ICD-10-CM | POA: Diagnosis not present

## 2021-06-13 NOTE — ED Triage Notes (Signed)
Patient reports that he was diagnosed with a yeast infection 3 weeks ago. Patient was seen on 71/22 for the same and states he was told he was using too much of the cream that was ordered for him. Patient states his genitals look worse and is having blisters that have broke.

## 2021-06-13 NOTE — ED Provider Notes (Signed)
[facility] DEPT Provider Note   CSN: [id number] Arrival date & time: 06/13/21  1231     History Chief Complaint  Patient presents with   Rash    Sean Saunders is a 75 y.o. male.  Patient presents with rash to scrotum.  It has been there for 3 weeks.  Initially told he may have a fungal infection and he has been applying topical antifungal cream as well as oral medications for 10 days.  Afterwards he noticed irritation to the scrotum and redness to the scrotum.  He was seen again in urgent care and was advised to go to the ER.  He was seen in the ER 3 days ago and advised continued outpatient care.  Patient returns to the ER today stating that he noticed a blister in his genital region which he popped yesterday, has continued redness and presents back to the ER.  Denies fevers or cough or vomiting or diarrhea.  Blood sugars at home have been around 150.      Past Medical History:  Diagnosis Date   Arthritis    COPD (chronic obstructive pulmonary disease) ([place])    Diabetes mellitus    DVT (deep venous thrombosis) (HCC)    GERD (gastroesophageal reflux disease)    Hyperlipidemia    Hypertension    Hypothyroidism    Onychomycosis 06/07/2013   Stroke ([place])    TIA's 01/05/11-01/08/11-01/26/11   TIA (transient ischemic attack) 08/03/2011   Left sided weakness. 3 episodes Feb-March    Patient Active Problem List   Diagnosis Date Noted   Stridor 08/31/2020   Chronic respiratory failure with hypoxia ([place]) 08/31/2020   Depression 07/16/2020   Acute respiratory failure with hypoxia (HCC) 06/27/2020   Fever    Embolism and thrombosis of arteries of lower extremity ([place]) 01/10/2020   Cholelithiasis 12/31/2019   Acute pancreatitis 12/31/2019   Chronic sinusitis 09/03/2019   CKD (chronic kidney disease), stage III ([place]) 09/17/2018   Meningioma, cerebral ([place]) 09/17/2018   Vestibular schwannoma ([place]) 09/17/2018   Rhinitis, allergic 09/25/2017   Tobacco  use disorder 09/11/2017   COPD (chronic obstructive pulmonary disease) ([place]) 09/10/2017   Hypertension with heart disease 09/04/2017   Pain in lower limb 03/21/2014   Type 2 diabetes mellitus with diabetic neuropathy, unspecified ([place]) 03/21/2014   PVD (peripheral vascular disease) ([place]) 12/11/2013   Occlusion and stenosis of carotid artery without mention of cerebral infarction 12/11/2013   Onychomycosis 06/07/2013   Pain in joint, ankle and foot 06/07/2013   Callus of foot 06/07/2013   Peripheral vascular disease, unspecified ([place]) 05/29/2013   Aftercare following surgery of the circulatory system, NEC 05/29/2013   Other postoperative infection 11/13/2012   Neuropathic pain of lower extremity 11/23/2011   Atherosclerosis of native arteries of the extremities with intermittent claudication 10/12/2011    Past Surgical History:  Procedure Laterality Date   ABDOMINAL AORTAGRAM N/A 06/04/2012   Procedure: ABDOMINAL Maxcine Ham;  Surgeon: Angelia Mould, MD;  Location: [facility] CATH LAB;  Service: Cardiovascular;  Laterality: N/A;   APPENDECTOMY     COLONOSCOPY     FEMORAL-POPLITEAL BYPASS GRAFT  07/05/2012   Procedure: BYPASS GRAFT FEMORAL-POPLITEAL ARTERY;  Surgeon: Angelia Mould, MD;  Location: [facility];  Service: Vascular;  Laterality: Left;  Revision Left fem/pop w/ artery patch angioplasty utilizing left GSV graft and extenion to tib/per trunk w/right GSV and interop arteriogram   JOINT REPLACEMENT  2011   -Hip   NASAL SINUS SURGERY  PR VEIN BYPASS GRAFT,AORTO-FEM-POP     PR VEIN BYPASS GRAFT,AORTO-FEM-POP  08-01-11   (L) Fem-pop BP   toe nail     TONSILLECTOMY         Family History  Problem Relation Age of Onset   Stroke Mother    Diabetes Mother    Other Father        alzheimers   Diabetes Father    Stroke Father    Diabetes Sister     Social History   Tobacco Use   Smoking status: Former    Packs/day: 1.00    Years: 25.00    Pack years: 25.00    Types:  Cigarettes    Quit date: 06/05/2018    Years since quitting: 3.0   Smokeless tobacco: Never  Vaping Use   Vaping Use: Never used  Substance Use Topics   Alcohol use: Not Currently    Alcohol/week: 0.0 standard drinks    Comment: rare   Drug use: No    Home Medications Prior to Admission medications   Medication Sig Start Date End Date Taking? Authorizing Provider  albuterol (VENTOLIN HFA) 108 (90 Base) MCG/ACT inhaler Inhale 2 puffs into the lungs every 6 (six) hours as needed for wheezing or shortness of breath.    [provider]  allopurinol (ZYLOPRIM) 100 MG tablet Take 100 mg by mouth 2 (two) times daily.    [provider]  amLODipine (NORVASC) 10 MG tablet Take 1 tablet (10 mg total) by mouth daily. 05/03/21 06/02/21  Arrien, Jimmy Picket, MD  carvedilol (COREG) 25 MG tablet Take 1 tablet (25 mg total) by mouth 2 (two) times daily with a meal. 05/02/21 06/01/21  Arrien, Jimmy Picket, MD  cetirizine (ZYRTEC) 10 MG tablet TAKE 1 TABLET(10 MG) BY MOUTH DAILY 01/11/21   Collene Gobble, MD  cholecalciferol (VITAMIN D3) 25 MCG (1000 UNIT) tablet Take 1,000 Units by mouth daily.    [provider]  clopidogrel (PLAVIX) 75 MG tablet Take 75 mg by mouth daily.    [provider]  empagliflozin (JARDIANCE) 25 MG TABS tablet Take 0.5 tablets by mouth daily. 03/02/21 03/03/22  [provider]  fluticasone (FLONASE) 50 MCG/ACT nasal spray Place 2 sprays into both nostrils daily. 08/07/19   Martinique, Betty G, MD  furosemide (LASIX) 20 MG tablet Take 20 mg by mouth daily.    [provider]  guaiFENesin-dextromethorphan (ROBITUSSIN DM) 100-10 MG/5ML syrup Take 5 mLs by mouth every 6 (six) hours as needed for cough. 05/02/21   Arrien, Jimmy Picket, MD  hydrochlorothiazide (HYDRODIURIL) 25 MG tablet Take 25 mg by mouth 2 (two) times daily. 06/07/20   [provider]  insulin regular human CONCENTRATED (HUMULIN R) 500 UNIT/ML kwikpen Inject  40-70 Units into the skin 3 (three) times daily as needed (high blood sugar). 03/02/21   [provider]  Ipratropium-Albuterol (COMBIVENT RESPIMAT) 20-100 MCG/ACT AERS respimat Inhale 1 puff into the lungs 2 (two) times daily as needed for wheezing or shortness of breath.    [provider]  ipratropium-albuterol (DUONEB) 0.5-2.5 (3) MG/3ML SOLN Take 3 mLs by nebulization every 6 (six) hours as needed (sob & wheezing).    [provider]  levothyroxine (SYNTHROID, LEVOTHROID) 150 MCG tablet Take 150 mcg by mouth daily before breakfast.    [provider]  metFORMIN (GLUCOPHAGE) 500 MG tablet Take 500 mg by mouth 2 (two) times daily with a meal.    [provider]  Multiple Vitamin (MULTIVITAMIN  WITH MINERALS) TABS tablet Take 1 tablet by mouth daily.    [provider]  rOPINIRole (REQUIP) 0.25 MG tablet Take 0.25 mg by mouth at bedtime.    [provider]  rosuvastatin (CRESTOR) 40 MG tablet Take 40 mg by mouth daily. 04/06/20   [provider]  tamsulosin (FLOMAX) 0.4 MG CAPS capsule Take 0.4 mg by mouth.    [provider]  vitamin C (ASCORBIC ACID) 500 MG tablet Take 500 mg by mouth daily.    [provider]  vitamin E 180 MG (400 UNITS) capsule Take 400 Units by mouth daily.    [provider]    Allergies    Eszopiclone, Niaspan [niacin er], and Other  Review of Systems   Review of Systems  Constitutional:  Negative for fever.  HENT:  Negative for ear pain and sore throat.   Eyes:  Negative for pain.  Respiratory:  Negative for cough.   Cardiovascular:  Negative for chest pain.  Gastrointestinal:  Negative for abdominal pain.  Genitourinary:  Negative for flank pain.  Musculoskeletal:  Negative for back pain.  Skin:  Positive for rash. Negative for color change.  Neurological:  Negative for syncope.  All other systems reviewed and are negative.  Physical Exam Updated Vital Signs BP  (!) 164/81   Pulse (!) 104   Temp 98.2 F (36.8 C) (Oral)   Resp 16   Ht 6' (1.829 m)   Wt 99.8 kg   SpO2 99%   BMI 29.84 kg/m   Physical Exam Constitutional:      General: He is not in acute distress.    Appearance: He is well-developed.  HENT:     Head: Normocephalic.     Nose: Nose normal.  Eyes:     Extraocular Movements: Extraocular movements intact.  Cardiovascular:     Rate and Rhythm: Normal rate.  Pulmonary:     Effort: Pulmonary effort is normal.  Genitourinary:    Comments: Patient's scrotum has diffuse mild erythema consistent with a dermatitis.  No abnormal warmth no purulent discharge no fluctuant lesion no open lesion noted.  No tenderness on exam. Skin:    Coloration: Skin is not jaundiced.  Neurological:     Mental Status: He is alert. Mental status is at baseline.    ED Results / Procedures / Treatments   Labs (all labs ordered are listed, but only abnormal results are displayed) Labs Reviewed - No data to display  EKG None  Radiology No results found.  Procedures Procedures   Medications Ordered in ED Medications - No data to display  ED Course  I have reviewed the triage vital signs and the nursing notes.  Pertinent labs & imaging results that were available during my care of the patient were reviewed by me and considered in my medical decision making (see chart for details).    MDM Rules/Calculators/A&P                          I did consider cellulitis does not appear the case on exam.  No evidence of Fournier's gangrene.  Patient scrotum appears more irritated by all the medications he is been applying.  Recommended him to stop the medication and to apply barrier cream such as Aquaphor or petroleum jelly.  Given this is been going on for 3 weeks will advise follow-up with dermatology.  Advising immediate return if he has open lesions worsening symptoms fevers purulent drainage  or any additional concerns.  Final Clinical Impression(s)  / ED Diagnoses Final diagnoses:  Irritant contact dermatitis, unspecified trigger    Rx / DC Orders ED Discharge Orders     None        Luna Fuse, MD 06/13/21 1554

## 2021-06-13 NOTE — Discharge Instructions (Addendum)
Avoid applying any additional medication to your scrotum other than a barrier ointment such as Aquaphor or petroleum jelly.  Follow-up with a specialist within the next week.  Return immediately back to the ER if:  Your symptoms worsen within the next 12-24 hours. You develop new symptoms such as new fevers, persistent vomiting, new pain, shortness of breath, or new weakness or numbness, or if you have any other concerns.

## 2021-07-01 DIAGNOSIS — J449 Chronic obstructive pulmonary disease, unspecified: Secondary | ICD-10-CM | POA: Diagnosis not present

## 2021-07-11 ENCOUNTER — Other Ambulatory Visit: Payer: Self-pay | Admitting: Emergency Medicine

## 2021-08-01 DIAGNOSIS — J449 Chronic obstructive pulmonary disease, unspecified: Secondary | ICD-10-CM | POA: Diagnosis not present

## 2021-08-27 ENCOUNTER — Telehealth: Payer: Self-pay | Admitting: Family Medicine

## 2021-08-27 NOTE — Telephone Encounter (Signed)
Left message for patient to call back and schedule Medicare Annual Wellness Visit (AWV) either virtually or in office. Left  my Sean Saunders number [phone]   Last AWV 08/25/20 please schedule at anytime with [facility] Nurse Health Advisor 1 or 2   This should be a 45 minute visit.

## 2021-08-30 ENCOUNTER — Telehealth: Payer: Self-pay | Admitting: Emergency Medicine

## 2021-08-30 NOTE — Telephone Encounter (Signed)
Pt stated that he does not have the energy to lug the oxygen tanks around.  He is requesting that an order be sent in for the POC so that he is able to move around without the huge tanks.  RB please advise. Thanks

## 2021-08-30 NOTE — Telephone Encounter (Signed)
Called and spoke with pt letting him know the info stated by RB and he verbalized understanding. Stated to pt that we could get this taken care of at upcoming [place] 9/21 and he verbalized understanding. I did make a note in appt section that this will need to be done. Nothing further needed.

## 2021-08-30 NOTE — Telephone Encounter (Signed)
That is fine - he will need a walking oximetry to titrate him on pulsed flow if this has not already been done. If he is already on pulsed flow then we can send an order to his DME using the same settings / flow rate.

## 2021-08-31 ENCOUNTER — Ambulatory Visit: Payer: Medicare HMO

## 2021-09-01 ENCOUNTER — Telehealth: Payer: Self-pay | Admitting: Primary Care

## 2021-09-01 ENCOUNTER — Encounter: Payer: Self-pay | Admitting: Primary Care

## 2021-09-01 ENCOUNTER — Other Ambulatory Visit: Payer: Self-pay

## 2021-09-01 ENCOUNTER — Ambulatory Visit (INDEPENDENT_AMBULATORY_CARE_PROVIDER_SITE_OTHER): Payer: Medicare PPO | Admitting: Primary Care

## 2021-09-01 VITALS — BP 146/60 | HR 99 | Temp 98.3°F | Ht 72.0 in | Wt 219.0 lb

## 2021-09-01 DIAGNOSIS — J449 Chronic obstructive pulmonary disease, unspecified: Secondary | ICD-10-CM

## 2021-09-01 DIAGNOSIS — J320 Chronic maxillary sinusitis: Secondary | ICD-10-CM

## 2021-09-01 DIAGNOSIS — J9611 Chronic respiratory failure with hypoxia: Secondary | ICD-10-CM | POA: Diagnosis not present

## 2021-09-01 DIAGNOSIS — Z23 Encounter for immunization: Secondary | ICD-10-CM | POA: Diagnosis not present

## 2021-09-01 MED ORDER — TRELEGY ELLIPTA 100-62.5-25 MCG/INH IN AEPB: 1.0000 | INHALATION_SPRAY | Freq: Every day | RESPIRATORY_TRACT | 11 refills | Status: DC

## 2021-09-01 MED ORDER — TRELEGY ELLIPTA 100-62.5-25 MCG/INH IN AEPB: 1.0000 | INHALATION_SPRAY | Freq: Every day | RESPIRATORY_TRACT | 0 refills | Status: DC

## 2021-09-01 MED ORDER — AMOXICILLIN-POT CLAVULANATE 875-125 MG PO TABS: 1.0000 | ORAL_TABLET | Freq: Two times a day (BID) | ORAL | 0 refills | Status: DC

## 2021-09-01 NOTE — Assessment & Plan Note (Addendum)
-   Acute on chronic sinusitis. He reports having a lot of congestion with associated sinus pressure. Sending in RX Augmentin 1 tab twice daily x 7 days. Advised he start using nasal saline rinses twice a day.

## 2021-09-01 NOTE — Assessment & Plan Note (Signed)
-   Breathing continues to decline. He gets easily fatigued and winded with short distance. He is not currently using maintenance inhaler. Advised patient to resume Trelegy Ellipta 117mcg one puff daily (sample and RX sent). FU in 3 months.

## 2021-09-01 NOTE — Patient Instructions (Addendum)
Nice seeing you today MR Sedivy  Recommendations: Use Neilmed saline rinse twice a day Please resume Trelegy Ellipta - take one puff daily in the morning (do not use more than prescribed)  Orders: Please walk patient on POC, titrate O2 liter flow to maintain >88-90%   Office treatment: High dose flu shot today   RX: Trelegy 193mcg (sent RX) Augmentin 1 tablet twice daily x 7 days (sent RX)  Follow-up: 3 months with DR. Bryum

## 2021-09-01 NOTE — Progress Notes (Signed)
@Patient  ID: Sean Saunders, male    DOB: 07-Jan-1946, 75 y.o.   MRN: [id number]  Chief Complaint  Patient presents with   Follow-up    Pts states no energy, SOB    Referring provider: Martinique, Betty G, MD  HPI: 75 year old male, former smoker quit 2019 (25 pack year hx). PMH significant for COPD, respiratory failure, allergic rhinitis, HTN, PVD, cholelithiasis, type 2 diabetes, CKD stage 3. Patient of Dr. Lamonte Sakai. Advair changed to Trelegy.  Previous [facility] pulmonary encounter: ROV 11/02/2020 --75 year old gentleman with severe obstructive lung disease and allergic rhinitis, suspected exertional hypoxemia but no documented desaturations during cardiopulmonary rehab.  The desaturations in question happened when he was having flaring symptoms in October, was treated for an acute exacerbation with levofloxacin, prednisone taper.  Currently managed on Trelegy - he has been been using bid a lot instead. He isn't sure that it helps him very much. He has duoneb and uses a few times a day.  He has combivent, uses 0-1x a day.  Uses flutter valve approximately.  He has albuterol. Uses zyrtec and flonase 2-3x a day. He has a lot of throat phlegm especially in the am. Remains on lisinopril. No longer sees ENT Dr Anthony Sar, has had sinus sgy before.   04/30/2021 Acute OV : Cough  Patient presents for an acute office visit.  Patient complains over the last 2 weeks he has had increased cough congestion sinus drainage wheezing and shortness of breath.  Says that symptoms started out like a sinus infection.  He has had progressively worsening weakness general malaise and shortness of breath.  Patient says he is at the point where he has to rest while changing close.  He denies any fever, chest pain, orthopnea, increased edema.  Appetite has been down. Patient is supposed to be on Trelegy inhaler but has not been taking.  On arrival to the office today O2 saturations 83% on room air.  Required 3 to 4 L to maintain sats  greater than 88 to 90%. Patient is very short of breath with talking, stooped over on exam table. Says he is weak.  Has been using his combivent inhaler often .   09/01/2021- Interim hx  Patient presents today for 3 month follow-up. Breathing continues to decline. He experiences shortness of breath and fatigue with minimal exertion. He has a lot of sinus congestion but does not have a productive cough. Appears he is no longer taking Trelegy that he is supposed to be on. He is on 2L continuous flow oxygen; he increases oxygen to 3L with moderate exertion. He is interested in getting a portable oxygen concentrator. His current tank only last him 2.5 hours and is too heavy for him to carry. He gets out of breath walking 25-34ft to the mailbox.  He mentions several times that when it's his time to go he is ready. He denies SI. He is a DNR and has a living will. DME company is [employer].    Allergies  Allergen Reactions   Eszopiclone Itching   Niaspan [Niacin Er] Nausea And Vomiting and Rash   Other Hives and Nausea And Vomiting    Ozempic and     Immunization History  Administered Date(s) Administered   Fluad Quad(high Dose 65+) 08/13/2019, 08/31/2020   Influenza, High Dose Seasonal PF 09/25/2017, 09/17/2018   Influenza-Unspecified 08/30/2011, 10/25/2012, 11/11/2013, 08/12/2020   PFIZER Comirnaty(Gray Top)Covid-19 Tri-Sucrose Vaccine 01/20/2021   PFIZER(Purple Top)SARS-COV-2 Vaccination 02/13/2020, 03/11/2020   Pneumococcal Polysaccharide-23 11/08/2019  Tdap 12/11/2018    Past Medical History:  Diagnosis Date   Arthritis    COPD (chronic obstructive pulmonary disease) ([place])    Diabetes mellitus    DVT (deep venous thrombosis) (HCC)    GERD (gastroesophageal reflux disease)    Hyperlipidemia    Hypertension    Hypothyroidism    Onychomycosis 06/07/2013   Stroke ([place])    TIA's 01/05/11-01/08/11-01/26/11   TIA (transient ischemic attack) 08/03/2011   Left sided weakness. 3 episodes Feb-March     Tobacco History: Social History   Tobacco Use  Smoking Status Former   Packs/day: 1.00   Years: 25.00   Pack years: 25.00   Types: Cigarettes   Quit date: 06/05/2018   Years since quitting: 3.2  Smokeless Tobacco Never   Counseling given: Not Answered   Outpatient Medications Prior to Visit  Medication Sig Dispense Refill   albuterol (VENTOLIN HFA) 108 (90 Base) MCG/ACT inhaler Inhale 2 puffs into the lungs every 6 (six) hours as needed for wheezing or shortness of breath.     allopurinol (ZYLOPRIM) 100 MG tablet Take 100 mg by mouth 2 (two) times daily.     carboxymethylcellulose (REFRESH PLUS) 0.5 % SOLN INSTILL 1 DROP IN BOTH EYES FOUR TIMES A DAY AS NEEDED     cetirizine (ZYRTEC) 10 MG tablet TAKE 1 TABLET(10 MG) BY MOUTH DAILY 30 tablet 5   cholecalciferol (VITAMIN D3) 25 MCG (1000 UNIT) tablet Take 1,000 Units by mouth daily.     clopidogrel (PLAVIX) 75 MG tablet Take 75 mg by mouth daily.     empagliflozin (JARDIANCE) 25 MG TABS tablet Take 0.5 tablets by mouth daily.     fluticasone (FLONASE) 50 MCG/ACT nasal spray Place 2 sprays into both nostrils daily. 16 g 5   furosemide (LASIX) 20 MG tablet Take 20 mg by mouth daily.     hydrochlorothiazide (HYDRODIURIL) 25 MG tablet Take 25 mg by mouth 2 (two) times daily.     insulin regular human CONCENTRATED (HUMULIN R) 500 UNIT/ML kwikpen Inject 40-70 Units into the skin 3 (three) times daily as needed (high blood sugar).     Ipratropium-Albuterol (COMBIVENT RESPIMAT) 20-100 MCG/ACT AERS respimat Inhale 1 puff into the lungs 2 (two) times daily as needed for wheezing or shortness of breath.     ipratropium-albuterol (DUONEB) 0.5-2.5 (3) MG/3ML SOLN Take 3 mLs by nebulization every 6 (six) hours as needed (sob & wheezing).     levothyroxine (SYNTHROID, LEVOTHROID) 150 MCG tablet Take 150 mcg by mouth daily before breakfast.     metFORMIN (GLUCOPHAGE) 500 MG tablet Take 500 mg by mouth 2 (two) times daily with a meal.      Multiple Vitamin (MULTIVITAMIN WITH MINERALS) TABS tablet Take 1 tablet by mouth daily.     rOPINIRole (REQUIP) 0.25 MG tablet Take 0.25 mg by mouth at bedtime.     rosuvastatin (CRESTOR) 40 MG tablet Take 40 mg by mouth daily.     tamsulosin (FLOMAX) 0.4 MG CAPS capsule Take 0.4 mg by mouth.     vitamin C (ASCORBIC ACID) 500 MG tablet Take 500 mg by mouth daily.     vitamin E 180 MG (400 UNITS) capsule Take 400 Units by mouth daily.     amLODipine (NORVASC) 10 MG tablet Take 1 tablet (10 mg total) by mouth daily. 30 tablet 0   carvedilol (COREG) 25 MG tablet Take 1 tablet (25 mg total) by mouth 2 (two) times daily with a meal. 60 tablet 0  guaiFENesin-dextromethorphan (ROBITUSSIN DM) 100-10 MG/5ML syrup Take 5 mLs by mouth every 6 (six) hours as needed for cough. (Patient not taking: Reported on 09/01/2021) 118 mL 0   No facility-administered medications prior to visit.    Review of Systems  Review of Systems  Constitutional: Negative.   HENT:  Positive for congestion and postnasal drip.   Respiratory:  Positive for cough and shortness of breath.     Physical Exam  BP (!) 146/60 (BP Location: Left Arm, Patient Position: Sitting, Cuff Size: Normal)   Pulse 99   Temp 98.3 F (36.8 C) (Oral)   Ht 6' (1.829 m)   Wt 219 lb (99.3 kg)   SpO2 98%   BMI 29.70 kg/m  Physical Exam Constitutional:      Appearance: Normal appearance.  HENT:     Head: Normocephalic and atraumatic.     Nose: Congestion present.     Mouth/Throat:     Comments: Deferred d/t masking Cardiovascular:     Rate and Rhythm: Normal rate and regular rhythm.     Comments: 2L [place] continuous  Pulmonary:     Effort: Pulmonary effort is normal.     Breath sounds: Rhonchi present. No wheezing.  Musculoskeletal:        General: Normal range of motion.  Skin:    General: Skin is warm and dry.  Neurological:     General: No focal deficit present.     Mental Status: He is alert and oriented to person, place, and  time. Mental status is at baseline.  Psychiatric:        Mood and Affect: Mood normal.        Behavior: Behavior normal.        Thought Content: Thought content normal.        Judgment: Judgment normal.     Lab Results:  CBC    Component Value Date/Time   WBC 13.4 (H) 05/01/2021 0803   RBC 4.16 (L) 05/01/2021 0803   HGB 11.9 (L) 05/01/2021 0803   HCT 37.9 (L) 05/01/2021 0803   PLT 225 05/01/2021 0803   MCV 91.1 05/01/2021 0803   MCH 28.6 05/01/2021 0803   MCHC 31.4 05/01/2021 0803   RDW 15.0 05/01/2021 0803   LYMPHSABS 5.1 (H) 04/30/2021 1343   MONOABS 0.8 04/30/2021 1343   EOSABS 0.3 04/30/2021 1343   BASOSABS 0.1 04/30/2021 1343    BMET    Component Value Date/Time   NA 136 05/02/2021 0645   K 5.0 05/02/2021 0645   CL 107 05/02/2021 0645   CO2 24 05/02/2021 0645   GLUCOSE 208 (H) 05/02/2021 0645   BUN 59 (H) 05/02/2021 0645   CREATININE 1.70 (H) 05/02/2021 0645   CREATININE 1.26 07/31/2013 1018   CALCIUM 9.8 05/02/2021 0645   GFRNONAA 42 (L) 05/02/2021 0645   GFRAA >60 07/01/2020 0433    BNP    Component Value Date/Time   BNP 98.3 06/27/2020 0509    ProBNP    Component Value Date/Time   PROBNP <30.0 01/24/2008 1537    Imaging: No results found.   Assessment & Plan:   COPD (chronic obstructive pulmonary disease) (HCC) - Breathing continues to decline. He gets easily fatigued and winded with short distance. He is not currently using maintenance inhaler. Advised patient to resume Trelegy Ellipta 130mcg one puff daily (sample and RX sent). FU in 3 months.   Chronic sinusitis - Acute on chronic sinusitis. He reports having a lot of congestion with associated sinus pressure.  Sending in RX Augmentin 1 tab twice daily x 7 days. Advised he start using nasal saline rinses twice a day.   Chronic respiratory failure with hypoxia ([place]) - Patient is having trouble carrying around portable oxygen tanks  - Sending in DME order for POC, he was able to maintain  O2 saturation > 90% on 2L pulsed    Martyn Ehrich, NP 09/01/2021

## 2021-09-01 NOTE — Addendum Note (Signed)
Addended by: Fenton Foy on: 09/01/2021 02:28 PM   Modules accepted: Orders

## 2021-09-01 NOTE — Assessment & Plan Note (Signed)
-   Patient is having trouble carrying around portable oxygen tanks  - Sending in DME order for POC, he was able to maintain O2 saturation > 90% on 2L pulsed

## 2021-09-02 NOTE — Telephone Encounter (Signed)
Called and spoke with the pt and notified of Holly's response. Nothing further needed.

## 2021-09-02 NOTE — Telephone Encounter (Signed)
Below is the response from [facility] to DME order placed by Derl Barrow before pt made a phone call.    [facility], pt was informed yesterday that we are unable to provide him with a POC. I did offer to send orders to you guys for an OCD to get smaller tanks, but he does not want to carry tanks. If there is anything else we can do please let our office know.   Thank you

## 2021-09-02 NOTE — Telephone Encounter (Signed)
Pt started on O2 with [employer] on 04/30/21.  Pt is not eligible to switch DME suppliers for O2 needs as it has been more than 30 days since starting the O2 & pt will not be eligible to change for 5 years.  If we send a POC order to [facility], pt will be responsible for paying out of pocket.

## 2021-09-10 DIAGNOSIS — L84 Corns and callosities: Secondary | ICD-10-CM | POA: Diagnosis not present

## 2021-09-10 DIAGNOSIS — I739 Peripheral vascular disease, unspecified: Secondary | ICD-10-CM | POA: Diagnosis not present

## 2021-09-10 DIAGNOSIS — L603 Nail dystrophy: Secondary | ICD-10-CM | POA: Diagnosis not present

## 2021-09-10 DIAGNOSIS — E1151 Type 2 diabetes mellitus with diabetic peripheral angiopathy without gangrene: Secondary | ICD-10-CM | POA: Diagnosis not present

## 2021-10-01 DIAGNOSIS — J449 Chronic obstructive pulmonary disease, unspecified: Secondary | ICD-10-CM | POA: Diagnosis not present

## 2021-10-07 ENCOUNTER — Telehealth: Payer: Self-pay

## 2021-10-07 NOTE — Telephone Encounter (Signed)
Patient was called to schedule an office visit per DR byrum. In order to get [employer] oxygen under control

## 2021-10-11 ENCOUNTER — Telehealth: Payer: Self-pay | Admitting: Family Medicine

## 2021-10-11 NOTE — Telephone Encounter (Signed)
Left message for patient to call back and schedule Medicare Annual Wellness Visit (AWV) either virtually or in office. Left  my Herbie Drape number [phone]   Last AWV ;08/25/20 please schedule at anytime with [facility] Nurse Health Advisor 1 or 2   This should be a 45 minute visit.

## 2021-10-12 DIAGNOSIS — D225 Melanocytic nevi of trunk: Secondary | ICD-10-CM | POA: Diagnosis not present

## 2021-10-12 DIAGNOSIS — D2271 Melanocytic nevi of right lower limb, including hip: Secondary | ICD-10-CM | POA: Diagnosis not present

## 2021-10-12 DIAGNOSIS — Z85828 Personal history of other malignant neoplasm of skin: Secondary | ICD-10-CM | POA: Diagnosis not present

## 2021-10-12 DIAGNOSIS — D044 Carcinoma in situ of skin of scalp and neck: Secondary | ICD-10-CM | POA: Diagnosis not present

## 2021-10-12 DIAGNOSIS — L570 Actinic keratosis: Secondary | ICD-10-CM | POA: Diagnosis not present

## 2021-10-12 DIAGNOSIS — D2261 Melanocytic nevi of right upper limb, including shoulder: Secondary | ICD-10-CM | POA: Diagnosis not present

## 2021-10-12 DIAGNOSIS — L814 Other melanin hyperpigmentation: Secondary | ICD-10-CM | POA: Diagnosis not present

## 2021-10-12 DIAGNOSIS — L820 Inflamed seborrheic keratosis: Secondary | ICD-10-CM | POA: Diagnosis not present

## 2021-10-15 NOTE — Progress Notes (Signed)
Attending addendum  Reviewed notes and agree with plan.  He be a good candidate for noninvasive positive pressure ventilation at night to support his chronic respiratory failure.  He has COPD and chronic hypoxic and hypercapnic respiratory failure, severe life-threatening disease state.  NIPPV would be an essential therapy for his chronic condition as it can accommodate a dual prescription, is able to measure the volume and pressures accurately.  Close monitoring with NIV will be the most efficient way to maintain patient respiratory status, avoid future hospital stays.  BiPAP and CPAP do not have these capabilities and are not good options for him.  NIPPV will provide mouthpiece ventilation and BiPAP ST does not have this capability.  Due to the severity the patient condition the absence of NIPPV can increase the patient's PCO2, could result in hospitalization readmission or death.   Baltazar Apo, MD, PhD 10/15/2021, 4:55 PM [facility] [phone] or if no answer before 7:00PM call [phone] For any issues after 7:00PM please call eLink [phone]

## 2021-10-26 ENCOUNTER — Telehealth: Payer: Self-pay | Admitting: Emergency Medicine

## 2021-10-26 NOTE — Telephone Encounter (Signed)
This needs to be set up by our [facility] team

## 2021-10-26 NOTE — Telephone Encounter (Signed)
Will route to BW since she last seen the pt on 09/01/2021

## 2021-10-26 NOTE — Telephone Encounter (Signed)
We do not do peer to peers For DME equipment that is done through the [employer] we only do peer to peer for procedures

## 2021-10-26 NOTE — Telephone Encounter (Signed)
[employer] and spoke with [place]. Advised her that no order for a vent has been placed nor will our office be doing a peer to peer. She verbalized understanding.   Nothing further needed.

## 2021-11-26 ENCOUNTER — Encounter: Payer: Self-pay | Admitting: Emergency Medicine

## 2021-11-26 ENCOUNTER — Ambulatory Visit (INDEPENDENT_AMBULATORY_CARE_PROVIDER_SITE_OTHER): Payer: Medicare PPO | Admitting: Emergency Medicine

## 2021-11-26 ENCOUNTER — Other Ambulatory Visit: Payer: Self-pay

## 2021-11-26 DIAGNOSIS — J9611 Chronic respiratory failure with hypoxia: Secondary | ICD-10-CM | POA: Diagnosis not present

## 2021-11-26 DIAGNOSIS — J449 Chronic obstructive pulmonary disease, unspecified: Secondary | ICD-10-CM | POA: Diagnosis not present

## 2021-11-26 DIAGNOSIS — J309 Allergic rhinitis, unspecified: Secondary | ICD-10-CM | POA: Diagnosis not present

## 2021-11-26 NOTE — Patient Instructions (Signed)
Continue your Trelegy 1 inhalation once daily.  Rinse and gargle after using. Continue to use your DuoNeb up to every 6 hours as you need it for shortness of breath, chest tightness, wheezing. Keep your albuterol available use 2 puffs up to every 4 hours if needed for shortness of breath. Continue your cetirizine as you have been taking it Continue your fluticasone nasal spray once daily.  You could consider increasing to twice daily if you have increased nasal mucus and throat congestion. Use nasal saline rinses as needed Continue your oxygen at 2-3 L/min depending on your level of exertion. Keep your flu shot and COVID-vaccine up-to-date. Follow with APP in 3 months or sooner if any problems. Follow Dr. Lamonte Sakai in 6 months.

## 2021-11-26 NOTE — Assessment & Plan Note (Signed)
Continue your cetirizine as you have been taking it Continue your fluticasone nasal spray once daily.  You could consider increasing to twice daily if you have increased nasal mucus and throat congestion. Use nasal saline rinses as needed

## 2021-11-26 NOTE — Assessment & Plan Note (Signed)
We will try to get him noninvasive ventilation at night.  It was rejected by his insurance and DME despite documentation that he would benefit.  For now he is continuing to use oxygen at night.  Continue your oxygen at 2-3 L/min depending on your level of exertion.

## 2021-11-26 NOTE — Progress Notes (Signed)
Subjective:    Patient ID: Sean Saunders, male    DOB: 08-Aug-1946, 75 y.o.   MRN: [id number]  HPI  ROV 11/02/2020 --75 year old gentleman with severe obstructive lung disease and allergic rhinitis, suspected exertional hypoxemia but no documented desaturations during cardiopulmonary rehab.  The desaturations in question happened when he was having flaring symptoms in October, was treated for an acute exacerbation with levofloxacin, prednisone taper.  Currently managed on Trelegy - he has been been using bid a lot instead. He isn't sure that it helps him very much. He has duoneb and uses a few times a day.  He has combivent, uses 0-1x a day.  Uses flutter valve approximately.  He has albuterol. Uses zyrtec and flonase 2-3x a day. He has a lot of throat phlegm especially in the am. Remains on lisinopril. No longer sees ENT Dr Anthony Sar, has had sinus sgy before.   ROV 11/26/21 --follow-up visit 75 year old man with severe structural lung disease and chronic hypoxic respiratory failure.  Also with a history of chronic cough, rhinitis.  He has supplemental oxygen.  I had done an addendum to an office visit 09/01/2021 to document that he would benefit from a trilogy ventilator. It was refused by his DME company.  He is using O2 at 2-3L/min depending on his exertional level. He is on Trelegy, uses duoneb bid. He uses albuterol about . He has some clear throat mucous, feels nasal drainage in the am on cetirizine and fluticasone nasal spray. Coughs several times a day, clear mucous. Able to walk through the store with his O2 and a buggy.  No hospitalizations. Treated w abx and pred in September.  Averages 2-3x a year.    Review of Systems  Constitutional:  Negative for fever and unexpected weight change.  HENT:  Negative for congestion, dental problem, ear pain, nosebleeds, postnasal drip, rhinorrhea, sinus pressure, sneezing, sore throat and trouble swallowing.   Eyes:  Negative for redness and itching.   Respiratory:  Positive for cough, chest tightness, shortness of breath and wheezing.   Cardiovascular:  Negative for palpitations and leg swelling.  Gastrointestinal:  Negative for nausea and vomiting.  Genitourinary:  Negative for dysuria.  Musculoskeletal:  Negative for joint swelling.  Skin:  Negative for rash.  Neurological:  Negative for headaches.  Hematological:  Does not bruise/bleed easily.  Psychiatric/Behavioral:  Negative for dysphoric mood. The patient is not nervous/anxious.       Objective:   Physical Exam Vitals:   11/26/21 1131  BP: 134/68  Pulse: 72  Temp: 97.9 F (36.6 C)  TempSrc: Oral  SpO2: 99%  Weight: 216 lb (98 kg)  Height: 6' (1.829 m)   Gen: Pleasant, obese man, in no distress,  normal affect  ENT: No lesions,  UA noise / secretions on expiration.   Neck: No JVD, loud upper airway noise, secretions esp on expiration  Lungs: No use of accessory muscles, distant, distant but no wheezing, just referred upper airway noise  Cardiovascular: RRR, heart sounds normal, no murmur or gallops, no peripheral edema  Musculoskeletal: No deformities, no cyanosis or clubbing  Neuro: alert, non focal  Skin: Warm, no lesions or rash      Assessment & Plan:  COPD (chronic obstructive pulmonary disease) (HCC) Overall stable although he does have intrusive daily symptoms.  Most of these today are upper airway mucus, throat mucus and cough.  Plan to continue same regimen.  Continue your Trelegy 1 inhalation once daily.  Rinse and gargle after  using. Continue to use your DuoNeb up to every 6 hours as you need it for shortness of breath, chest tightness, wheezing. Keep your albuterol available use 2 puffs up to every 4 hours if needed for shortness of breath. Keep your flu shot and COVID-vaccine up-to-date. Follow with APP in 3 months or sooner if any problems. Follow Dr. Lamonte Sakai in 6 months.  Rhinitis, allergic Continue your cetirizine as you have been taking  it Continue your fluticasone nasal spray once daily.  You could consider increasing to twice daily if you have increased nasal mucus and throat congestion. Use nasal saline rinses as needed  Chronic respiratory failure with hypoxia (HCC) We will try to get him noninvasive ventilation at night.  It was rejected by his insurance and DME despite documentation that he would benefit.  For now he is continuing to use oxygen at night.  Continue your oxygen at 2-3 L/min depending on your level of exertion.  Baltazar Apo, MD, PhD 11/26/2021, 11:57 AM [facility] [phone] or if no answer [phone]

## 2021-11-26 NOTE — Assessment & Plan Note (Signed)
Overall stable although he does have intrusive daily symptoms.  Most of these today are upper airway mucus, throat mucus and cough.  Plan to continue same regimen.  Continue your Trelegy 1 inhalation once daily.  Rinse and gargle after using. Continue to use your DuoNeb up to every 6 hours as you need it for shortness of breath, chest tightness, wheezing. Keep your albuterol available use 2 puffs up to every 4 hours if needed for shortness of breath. Keep your flu shot and COVID-vaccine up-to-date. Follow with APP in 3 months or sooner if any problems. Follow Dr. Lamonte Sakai in 6 months.

## 2021-12-14 DIAGNOSIS — I739 Peripheral vascular disease, unspecified: Secondary | ICD-10-CM | POA: Diagnosis not present

## 2021-12-14 DIAGNOSIS — L84 Corns and callosities: Secondary | ICD-10-CM | POA: Diagnosis not present

## 2021-12-14 DIAGNOSIS — E1151 Type 2 diabetes mellitus with diabetic peripheral angiopathy without gangrene: Secondary | ICD-10-CM | POA: Diagnosis not present

## 2021-12-14 DIAGNOSIS — L603 Nail dystrophy: Secondary | ICD-10-CM | POA: Diagnosis not present

## 2022-02-05 ENCOUNTER — Other Ambulatory Visit: Payer: Self-pay

## 2022-02-05 DIAGNOSIS — I739 Peripheral vascular disease, unspecified: Secondary | ICD-10-CM

## 2022-02-05 DIAGNOSIS — I6523 Occlusion and stenosis of bilateral carotid arteries: Secondary | ICD-10-CM

## 2022-02-10 ENCOUNTER — Other Ambulatory Visit (HOSPITAL_COMMUNITY): Payer: No Typology Code available for payment source

## 2022-02-10 ENCOUNTER — Ambulatory Visit: Payer: No Typology Code available for payment source | Admitting: Vascular Surgery

## 2022-02-10 ENCOUNTER — Encounter (HOSPITAL_COMMUNITY): Payer: No Typology Code available for payment source

## 2022-02-24 ENCOUNTER — Other Ambulatory Visit: Payer: Self-pay

## 2022-02-24 ENCOUNTER — Ambulatory Visit (INDEPENDENT_AMBULATORY_CARE_PROVIDER_SITE_OTHER): Payer: Medicare PPO

## 2022-02-24 ENCOUNTER — Encounter: Payer: Self-pay | Admitting: Nurse Practitioner

## 2022-02-24 ENCOUNTER — Ambulatory Visit (INDEPENDENT_AMBULATORY_CARE_PROVIDER_SITE_OTHER): Payer: Medicare PPO | Admitting: Nurse Practitioner

## 2022-02-24 VITALS — BP 142/80 | HR 82 | Wt 214.6 lb

## 2022-02-24 DIAGNOSIS — J9611 Chronic respiratory failure with hypoxia: Secondary | ICD-10-CM | POA: Diagnosis not present

## 2022-02-24 DIAGNOSIS — R059 Cough, unspecified: Secondary | ICD-10-CM | POA: Diagnosis not present

## 2022-02-24 DIAGNOSIS — I119 Hypertensive heart disease without heart failure: Secondary | ICD-10-CM

## 2022-02-24 DIAGNOSIS — J440 Chronic obstructive pulmonary disease with acute lower respiratory infection: Secondary | ICD-10-CM

## 2022-02-24 DIAGNOSIS — J320 Chronic maxillary sinusitis: Secondary | ICD-10-CM

## 2022-02-24 MED ORDER — DOXYCYCLINE HYCLATE 100 MG PO TABS: 100.0000 mg | ORAL_TABLET | Freq: Two times a day (BID) | ORAL | 0 refills | Status: AC

## 2022-02-24 MED ORDER — PREDNISONE 20 MG PO TABS: 40.0000 mg | ORAL_TABLET | Freq: Every day | ORAL | 0 refills | Status: AC

## 2022-02-24 MED ORDER — BENZONATATE 200 MG PO CAPS: 200.0000 mg | ORAL_CAPSULE | Freq: Three times a day (TID) | ORAL | 1 refills | Status: DC | PRN

## 2022-02-24 NOTE — Assessment & Plan Note (Signed)
Mild increase in SOB. New persistent productive cough. Tx with prednisone taper and doxy course. CXR without evidence of superimposed infection.  ? ?Patient Instructions  ?Continue your Trelegy 1 inhalation once daily.  Rinse and gargle after using. ?Continue to use your DuoNeb up to every 6 hours as you need it for shortness of breath, chest tightness, wheezing. ?Continue cetirizine as you have been taking it ?Continue fluticasone nasal spray once daily.  You could consider increasing to twice daily if you have increased nasal mucus and throat congestion. ?Use nasal saline rinses as needed ? ?Keep your albuterol available use 2 puffs up to every 4 hours if needed for shortness of breath. ?Keep your flu shot and COVID-vaccine up-to-date. ? ?Continue the sinus medicine you are taking at home as directed on the box  ? ?-Tessalon Perles 1 capsule every 8 hours as needed for cough ?-Prednisone 40 mg for 5 days. Take in AM with food ?-Doxycycline 100 mg Twice daily for 7 days. Take with food. Avoid direct sun exposure and wear sunscreen when out in the sun. ? ?Chest x ray today.  ? ?Follow up in 1 month with Dr. Lamonte Sakai or Alanson Aly. If symptoms do not improve or worsen, please contact office for sooner follow up or seek emergency care. ? ? ?

## 2022-02-24 NOTE — Assessment & Plan Note (Signed)
Oxygen stable on room air at visit. Continue supplemental 2 lpm at night and with activity as needed for goal SpO2 >88-90% 

## 2022-02-24 NOTE — Assessment & Plan Note (Signed)
Worsening symptoms. Possible viral URI. No indication for testing given length of symptoms. Continue supportive care measures. Postnasal drainage and cough control. ?

## 2022-02-24 NOTE — Patient Instructions (Addendum)
Continue your Trelegy 1 inhalation once daily.  Rinse and gargle after using. ?Continue to use your DuoNeb up to every 6 hours as you need it for shortness of breath, chest tightness, wheezing. ?Continue cetirizine as you have been taking it ?Continue fluticasone nasal spray once daily.  You could consider increasing to twice daily if you have increased nasal mucus and throat congestion. ?Use nasal saline rinses as needed ? ?Keep your albuterol available use 2 puffs up to every 4 hours if needed for shortness of breath. ?Keep your flu shot and COVID-vaccine up-to-date. ? ?Continue the sinus medicine you are taking at home as directed on the box  ? ?-Tessalon Perles 1 capsule every 8 hours as needed for cough ?-Prednisone 40 mg for 5 days. Take in AM with food ?-Doxycycline 100 mg Twice daily for 7 days. Take with food. Avoid direct sun exposure and wear sunscreen when out in the sun. ? ?Chest x ray today.  ? ?Follow up in 1 month with Dr. Lamonte Sakai or Alanson Aly. If symptoms do not improve or worsen, please contact office for sooner follow up or seek emergency care. ?

## 2022-02-24 NOTE — Assessment & Plan Note (Signed)
BP mildly elevated at visit. Follow up with PCP. ?

## 2022-02-24 NOTE — Progress Notes (Signed)
@Patient  ID: Sean Saunders, male    DOB: [birth year], 76 y.o.   MRN: [id number]  No chief complaint on file.   Referring provider: [place], Betty G, MD  HPI: 76 year old male, former smoker (25 pack years) followed for COPD, chronic respiratory failure and allergic rhinitis.  He is a patient Dr. Kavin Leech and last seen in office on 11/26/2021.  Past medical history significant for PVD, hypertension, previous DVT, DM 2, CKD stage III.  TEST/EVENTS:  05/22/2018 PFTs: FVC 3.02 (64), FEV1 1.8 (52), ratio 59, TLC 91%, DLCOunc 51%.  Moderate obstructive airway disease with diffusion defect without bronchodilator response 06/27/2020 CT chest without contrast: Minimal pericardial effusion noted.  Mild bilateral bullous changes and centrilobular distribution.  There was no acute process noted  11/26/2021: OV with Dr. Delton Coombes.  Symptoms mostly stable.  Continued on triple therapy with Trelegy.  Allergic rhinitis well-controlled on Zyrtec and Flonase.  Advised to use saline nasal rinses as needed.  Patient was previously ordered noninvasive ventilation at night however was rejected by insurance and DME despite documentation that he would benefit.  Advised to continue using oxygen at night and during the day at 2 to 3 L depending on level of exertion.  02/24/2022: Today-follow-up Patient presents today for follow-up however he is also reporting some acute symptoms.  About a week to 2 weeks ago he started having some increased nasal drainage and cough.  Describes his cough is productive at times with white sputum.  His sinus drainage has been minimal but he continues to have persistent congestion. His breathing is relatively stable; although, he does have some mild increase in his shortness of breath. Denies wheezing, hemoptysis, orthopnea, PND or leg swelling. Continues on Trelegy daily and hasn't used neb or albuterol. He started taking an over the sinus medicine twice a day which he feels has helped with his  nasal congestion but minimal improvement in his cough/breathing. He threw out his back a few days ago as well so has been seen by his PCP and treating this with Aleve.   Allergies  Allergen Reactions   Eszopiclone Itching   Niaspan [Niacin Er] Nausea And Vomiting and Rash   Other Hives and Nausea And Vomiting    Ozempic and     Immunization History  Administered Date(s) Administered   Fluad Quad(high Dose 65+) 08/13/2019, 08/31/2020, 09/01/2021   Influenza Split 09/11/2014, 08/15/2016   Influenza, High Dose Seasonal PF 08/30/2011, 09/11/2013, 09/11/2017, 09/25/2017, 09/17/2018   Influenza-Unspecified 08/30/2011, 10/25/2012, 11/11/2013, 08/12/2020   PFIZER Comirnaty(Gray Top)Covid-19 Tri-Sucrose Vaccine 01/20/2021   PFIZER(Purple Top)SARS-COV-2 Vaccination 02/13/2020, 03/11/2020   Pneumococcal Conjugate-13 02/27/2018   Pneumococcal Polysaccharide-23 11/08/2019   Pneumococcal-Unspecified 12/12/2009   Tdap 10/25/2012, 12/11/2018    Past Medical History:  Diagnosis Date   Arthritis    COPD (chronic obstructive pulmonary disease) (HCC)    Diabetes mellitus    DVT (deep venous thrombosis) (HCC)    GERD (gastroesophageal reflux disease)    Hyperlipidemia    Hypertension    Hypothyroidism    Onychomycosis 06/07/2013   Stroke (HCC)    TIA's 01/05/11-01/08/11-01/26/11   TIA (transient ischemic attack) 08/03/2011   Left sided weakness. 3 episodes Feb-March    Tobacco History: Social History   Tobacco Use  Smoking Status Former   Packs/day: 1.00   Years: 25.00   Pack years: 25.00   Types: Cigarettes   Quit date: 06/05/2018   Years since quitting: 3.7  Smokeless Tobacco Never   Counseling given: Not Answered  Outpatient Medications Prior to Visit  Medication Sig Dispense Refill   albuterol (VENTOLIN HFA) 108 (90 Base) MCG/ACT inhaler Inhale 2 puffs into the lungs every 6 (six) hours as needed for wheezing or shortness of breath.     allopurinol (ZYLOPRIM) 100 MG tablet Take  100 mg by mouth 2 (two) times daily.     amLODipine (NORVASC) 10 MG tablet Take 1 tablet (10 mg total) by mouth daily. 30 tablet 0   carboxymethylcellulose (REFRESH PLUS) 0.5 % SOLN INSTILL 1 DROP IN BOTH EYES FOUR TIMES A DAY AS NEEDED     carvedilol (COREG) 25 MG tablet Take 1 tablet (25 mg total) by mouth 2 (two) times daily with a meal. 60 tablet 0   cetirizine (ZYRTEC) 10 MG tablet TAKE 1 TABLET(10 MG) BY MOUTH DAILY 30 tablet 5   cholecalciferol (VITAMIN D3) 25 MCG (1000 UNIT) tablet Take 1,000 Units by mouth daily.     clopidogrel (PLAVIX) 75 MG tablet Take 75 mg by mouth daily.     empagliflozin (JARDIANCE) 25 MG TABS tablet Take 0.5 tablets by mouth daily.     fluticasone (FLONASE) 50 MCG/ACT nasal spray Place 2 sprays into both nostrils daily. 16 g 5   Fluticasone-Umeclidin-Vilant (TRELEGY ELLIPTA) 100-62.5-25 MCG/INH AEPB Inhale 1 puff into the lungs daily at 6 (six) AM. 60 each 11   Fluticasone-Umeclidin-Vilant (TRELEGY ELLIPTA) 100-62.5-25 MCG/INH AEPB Inhale 1 puff into the lungs daily. (Patient not taking: Reported on 11/26/2021) 14 each 0   furosemide (LASIX) 20 MG tablet Take 20 mg by mouth daily.     hydrochlorothiazide (HYDRODIURIL) 25 MG tablet Take 25 mg by mouth 2 (two) times daily.     insulin regular human CONCENTRATED (HUMULIN R) 500 UNIT/ML kwikpen Inject 40-70 Units into the skin 3 (three) times daily as needed (high blood sugar).     Ipratropium-Albuterol (COMBIVENT RESPIMAT) 20-100 MCG/ACT AERS respimat Inhale 1 puff into the lungs 2 (two) times daily as needed for wheezing or shortness of breath.     ipratropium-albuterol (DUONEB) 0.5-2.5 (3) MG/3ML SOLN Take 3 mLs by nebulization every 6 (six) hours as needed (sob & wheezing).     levothyroxine (SYNTHROID, LEVOTHROID) 150 MCG tablet Take 150 mcg by mouth daily before breakfast.     metFORMIN (GLUCOPHAGE) 500 MG tablet Take 500 mg by mouth 2 (two) times daily with a meal.     Multiple Vitamin (MULTIVITAMIN WITH  MINERALS) TABS tablet Take 1 tablet by mouth daily.     rOPINIRole (REQUIP) 0.25 MG tablet Take 0.25 mg by mouth at bedtime.     rosuvastatin (CRESTOR) 40 MG tablet Take 40 mg by mouth daily.     tamsulosin (FLOMAX) 0.4 MG CAPS capsule Take 0.4 mg by mouth.     vitamin C (ASCORBIC ACID) 500 MG tablet Take 500 mg by mouth daily.     vitamin E 180 MG (400 UNITS) capsule Take 400 Units by mouth daily.     amoxicillin-clavulanate (AUGMENTIN) 875-125 MG tablet Take 1 tablet by mouth 2 (two) times daily. 14 tablet 0   guaiFENesin-dextromethorphan (ROBITUSSIN DM) 100-10 MG/5ML syrup Take 5 mLs by mouth every 6 (six) hours as needed for cough. 118 mL 0   No facility-administered medications prior to visit.     Review of Systems:   Constitutional: No weight loss or gain, night sweats, fevers, chills, fatigue, or lassitude. HEENT: No headaches, difficulty swallowing, tooth/dental problems, or sore throat. No sneezing, itching, ear ache +nasal congestion, nasal drainage (improving with  current OTC meds and nasal sprays) CV:  No chest pain, orthopnea, PND, swelling in lower extremities, anasarca, dizziness, palpitations, syncope Resp: +mild shortness of breath with exertion; increased productive cough with white sputum. No hemoptysis. No wheezing.  No chest wall deformity GI:  No heartburn, indigestion, abdominal pain, nausea, vomiting, diarrhea, change in bowel habits, loss of appetite, bloody stools.  GU: No dysuria, change in color of urine, urgency or frequency.  No flank pain, no hematuria  Skin: No rash, lesions, ulcerations MSK:  No joint pain or swelling.  No decreased range of motion.  No back pain. Neuro: No dizziness or lightheadedness.  Psych: No depression or anxiety. Mood stable.     Physical Exam:  BP (!) 142/80   Pulse 82   Wt 214 lb 9.6 oz (97.3 kg)   SpO2 99%   BMI 29.10 kg/m   GEN: Pleasant, interactive, well-appearing; in no acute distress. HEENT:  Normocephalic and  atraumatic. EACs patent bilaterally. TM pearly gray with present light reflex bilaterally. PERRLA. Sclera white. Nasal turbinates pink, moist and patent bilaterally. Clear rhinorrhea present. Oropharynx pink and moist, without exudate or edema. No lesions, ulcerations, or postnasal drip.  NECK:  Supple w/ fair ROM. No JVD present. Normal carotid impulses w/o bruits. Thyroid symmetrical with no goiter or nodules palpated. No lymphadenopathy.   CV: RRR, no m/r/g, no peripheral edema. Pulses intact, +2 bilaterally. No cyanosis, pallor or clubbing. PULMONARY:  Unlabored, regular breathing. Scattered rhonchi bilaterally A&P. No accessory muscle use. No dullness to percussion. GI: BS present and normoactive. Soft, non-tender to palpation. No organomegaly or masses detected. No CVA tenderness. MSK: No erythema, warmth or tenderness. Cap refil <2 sec all extrem. No deformities or joint swelling noted.  Neuro: A/Ox3. No focal deficits noted.   Skin: Warm, no lesions or rashe Psych: Normal affect and behavior. Judgement and thought content appropriate.     Lab Results:  CBC    Component Value Date/Time   WBC 13.4 (H) 05/01/2021 0803   RBC 4.16 (L) 05/01/2021 0803   HGB 11.9 (L) 05/01/2021 0803   HCT 37.9 (L) 05/01/2021 0803   PLT 225 05/01/2021 0803   MCV 91.1 05/01/2021 0803   MCH 28.6 05/01/2021 0803   MCHC 31.4 05/01/2021 0803   RDW 15.0 05/01/2021 0803   LYMPHSABS 5.1 (H) 04/30/2021 1343   MONOABS 0.8 04/30/2021 1343   EOSABS 0.3 04/30/2021 1343   BASOSABS 0.1 04/30/2021 1343    BMET    Component Value Date/Time   NA 136 05/02/2021 0645   K 5.0 05/02/2021 0645   CL 107 05/02/2021 0645   CO2 24 05/02/2021 0645   GLUCOSE 208 (H) 05/02/2021 0645   BUN 59 (H) 05/02/2021 0645   CREATININE 1.70 (H) 05/02/2021 0645   CREATININE 1.26 07/31/2013 1018   CALCIUM 9.8 05/02/2021 0645   GFRNONAA 42 (L) 05/02/2021 0645   GFRAA >60 07/01/2020 0433    BNP    Component Value Date/Time    BNP 98.3 06/27/2020 0509     Imaging:  No results found.    PFT Results Latest Ref Rng & Units 05/22/2018  FVC-Pre L 2.89  FVC-Predicted Pre % 61  FVC-Post L 3.02  FVC-Predicted Post % 64  Pre FEV1/FVC % % 59  Post FEV1/FCV % % 59  FEV1-Pre L 1.70  FEV1-Predicted Pre % 49  FEV1-Post L 1.80  DLCO uncorrected ml/min/mmHg 18.21  DLCO UNC% % 51  DLVA Predicted % 66  TLC L 6.79  TLC %  Predicted % 91  RV % Predicted % 137    No results found for: NITRICOXIDE      Assessment & Plan:   COPD with acute lower respiratory infection (HCC) Mild increase in SOB. New persistent productive cough. Tx with prednisone taper and doxy course. CXR without evidence of superimposed infection.   Patient Instructions  Continue your Trelegy 1 inhalation once daily.  Rinse and gargle after using. Continue to use your DuoNeb up to every 6 hours as you need it for shortness of breath, chest tightness, wheezing. Continue cetirizine as you have been taking it Continue fluticasone nasal spray once daily.  You could consider increasing to twice daily if you have increased nasal mucus and throat congestion. Use nasal saline rinses as needed  Keep your albuterol available use 2 puffs up to every 4 hours if needed for shortness of breath. Keep your flu shot and COVID-vaccine up-to-date.  Continue the sinus medicine you are taking at home as directed on the box   -Tessalon Perles 1 capsule every 8 hours as needed for cough -Prednisone 40 mg for 5 days. Take in AM with food -Doxycycline 100 mg Twice daily for 7 days. Take with food. Avoid direct sun exposure and wear sunscreen when out in the sun.  Chest x ray today.   Follow up in 1 month with Dr. Delton Coombes or Philis Nettle. If symptoms do not improve or worsen, please contact office for sooner follow up or seek emergency care.    Chronic sinusitis Worsening symptoms. Possible viral URI. No indication for testing given length of symptoms. Continue  supportive care measures. Postnasal drainage and cough control.  Chronic respiratory failure with hypoxia (HCC) Oxygen stable on room air at visit. Continue supplemental 2 lpm at night and with activity as needed for goal SpO2 >88-90%  Hypertension with heart disease BP mildly elevated at visit. Follow up with PCP.   I spent 35 minutes of dedicated to the care of this patient on the date of this encounter to include pre-visit review of records, face-to-face time with the patient discussing conditions above, post visit ordering of testing, clinical documentation with the electronic health record, making appropriate referrals as documented, and communicating necessary findings to members of the patients care team.  Noemi Chapel, NP 02/24/2022  Pt aware and understands NP's role.

## 2022-03-15 DIAGNOSIS — E1151 Type 2 diabetes mellitus with diabetic peripheral angiopathy without gangrene: Secondary | ICD-10-CM | POA: Diagnosis not present

## 2022-03-15 DIAGNOSIS — I739 Peripheral vascular disease, unspecified: Secondary | ICD-10-CM | POA: Diagnosis not present

## 2022-03-15 DIAGNOSIS — L84 Corns and callosities: Secondary | ICD-10-CM | POA: Diagnosis not present

## 2022-03-15 DIAGNOSIS — L603 Nail dystrophy: Secondary | ICD-10-CM | POA: Diagnosis not present

## 2022-03-16 DIAGNOSIS — D320 Benign neoplasm of cerebral meninges: Secondary | ICD-10-CM | POA: Diagnosis not present

## 2022-03-16 DIAGNOSIS — D329 Benign neoplasm of meninges, unspecified: Secondary | ICD-10-CM | POA: Diagnosis not present

## 2022-03-23 ENCOUNTER — Encounter: Payer: Self-pay | Admitting: Physical Medicine & Rehabilitation

## 2022-03-28 ENCOUNTER — Ambulatory Visit (INDEPENDENT_AMBULATORY_CARE_PROVIDER_SITE_OTHER): Payer: No Typology Code available for payment source | Admitting: Nurse Practitioner

## 2022-03-28 ENCOUNTER — Encounter: Payer: Self-pay | Admitting: Nurse Practitioner

## 2022-03-28 VITALS — BP 122/60 | HR 67 | Ht 72.0 in | Wt 214.2 lb

## 2022-03-28 DIAGNOSIS — J9611 Chronic respiratory failure with hypoxia: Secondary | ICD-10-CM | POA: Diagnosis not present

## 2022-03-28 DIAGNOSIS — J309 Allergic rhinitis, unspecified: Secondary | ICD-10-CM | POA: Diagnosis not present

## 2022-03-28 DIAGNOSIS — M545 Low back pain, unspecified: Secondary | ICD-10-CM

## 2022-03-28 DIAGNOSIS — J449 Chronic obstructive pulmonary disease, unspecified: Secondary | ICD-10-CM | POA: Diagnosis not present

## 2022-03-28 DIAGNOSIS — M549 Dorsalgia, unspecified: Secondary | ICD-10-CM | POA: Insufficient documentation

## 2022-03-28 MED ORDER — MONTELUKAST SODIUM 10 MG PO TABS: 10.0000 mg | ORAL_TABLET | Freq: Every day | ORAL | 5 refills | Status: DC

## 2022-03-28 NOTE — Assessment & Plan Note (Signed)
Awaiting appointment with ortho for further eval. Taking aleve for pain control.  ?

## 2022-03-28 NOTE — Progress Notes (Signed)
? ?'@Patient'$  ID: Sean Saunders, male    DOB: [birth year], 76 y.o.   MRN: [id number] ? ?Chief Complaint  ?Patient presents with  ? Follow-up  ?  Fractured back ?Copd   ? ? ?Referring provider: ?[place], Betty G, MD ? ?HPI: ?76 year old male, former smoker (25 pack years) followed for COPD, chronic respiratory failure and allergie rhinitis. He is a patient of Dr. Agustina Caroli and last seen in office on 02/24/2022. Past medical history significant for PVD, HTN, previous DVT, DM 2, CKD stage III.  ? ?TEST/EVENTS:  ?05/22/2018 PFTs: FVC 3.02 (64), FEV1 1.8 (52), ratio 59, TLC 91%, DLCOunc 51%.  Moderate obstructive airway disease with diffusion defect without bronchodilator response. ?06/27/2020 CT chest without contrast: Minimal pericardial effusion noted.  Mild bilateral bullous changes and centrilobular distribution.  There is no acute process noted ?02/24/2022 CXR 2 view: Both lungs were clear.  There was no acute process noted. ? ?11/26/2021: OV with Dr. Lamonte Sakai.  Symptoms mostly stable.  Continued on triple therapy with Trelegy.  Allergic rhinitis well-controlled on Zyrtec and Flonase.  Advised to use saline nasal rinses as needed.  Patient was previously ordered noninvasive ventilation at night however was rejected by insurance and DME despite documentation that he would benefit.  Advised to continue using oxygen at night and during the day at 2 to 3 L depending on level of exertion. ? ?02/24/2022: OV with Murry Diaz NP. Acute symptoms with cough and increased nasal drainage. Mild increased in DOE. Improvement in symptoms with OTC sinus medicine. Tx with pred taper and doxy course. Continued Zyrtec and flonase for trigger prevention and postnasal drainage control. Advised to continue supplemental O2 2 lpm with activity and at night - no O2 requirement at visit. ? ?03/28/2022: Today - follow up ?Patient presents today for follow up after being treated for mild AECOPD. Still having some occasional nasal congestion/cough but overall,  improved. Feels like the over the counter sinus meds he's taking resolve his symptoms. Enjoys being outside but feels like the pollen triggers his symptoms. He denies any increased SOB, wheezing, fevers. Continues on Trelegy daily. Rare use of rescue. He continues on flonase nasal spray and over the counter allergy medicine. Oxygen has been stable at home without need for O2 with activity; wears nightly. Awaiting appt with ortho to discuss back pain/possible compression fracture in t spine.  ? ?Allergies  ?Allergen Reactions  ? Eszopiclone Itching  ? Niaspan [Niacin Er] Nausea And Vomiting and Rash  ? Other Hives and Nausea And Vomiting  ?  Ozempic and   ? ? ?Immunization History  ?Administered Date(s) Administered  ? Fluad Quad(high Dose 65+) 08/13/2019, 08/31/2020, 09/01/2021  ? Influenza Split 09/11/2014, 08/15/2016  ? Influenza, High Dose Seasonal PF 08/30/2011, 09/11/2013, 09/11/2017, 09/25/2017, 09/17/2018, 10/11/2019  ? Influenza,trivalent, recombinat, inj, PF 09/01/2016  ? Influenza-Unspecified 08/30/2011, 10/25/2012, 11/11/2013, 08/12/2020, 09/11/2021  ? PFIZER Comirnaty(Gray Top)Covid-19 Tri-Sucrose Vaccine 01/20/2021  ? PFIZER(Purple Top)SARS-COV-2 Vaccination 02/13/2020, 03/11/2020  ? Pneumococcal Conjugate-13 02/27/2018  ? Pneumococcal Polysaccharide-23 11/08/2019  ? Pneumococcal-Unspecified 12/12/2009  ? Tdap 10/25/2012, 12/11/2018  ? ? ?Past Medical History:  ?Diagnosis Date  ? Arthritis   ? COPD (chronic obstructive pulmonary disease) ([place])   ? Diabetes mellitus   ? DVT (deep venous thrombosis) ([place])   ? GERD (gastroesophageal reflux disease)   ? Hyperlipidemia   ? Hypertension   ? Hypothyroidism   ? Onychomycosis 06/07/2013  ? Stroke [facility])   ? TIA's 01/05/11-01/08/11-01/26/11  ? TIA (transient ischemic attack) 08/03/2011  ?  Left sided weakness. 3 episodes Feb-March  ? ? ?Tobacco History: ?Social History  ? ?Tobacco Use  ?Smoking Status Former  ? Packs/day: 1.00  ? Years: 25.00  ? Pack years: 25.00  ?  Types: Cigarettes  ? Quit date: 06/05/2018  ? Years since quitting: 3.8  ?Smokeless Tobacco Never  ? ?Counseling given: Not Answered ? ? ?Outpatient Medications Prior to Visit  ?Medication Sig Dispense Refill  ? albuterol (VENTOLIN HFA) 108 (90 Base) MCG/ACT inhaler Inhale 2 puffs into the lungs every 6 (six) hours as needed for wheezing or shortness of breath.    ? allopurinol (ZYLOPRIM) 100 MG tablet Take 100 mg by mouth 2 (two) times daily.    ? amLODipine (NORVASC) 10 MG tablet Take 1 tablet (10 mg total) by mouth daily. 30 tablet 0  ? carboxymethylcellulose (REFRESH PLUS) 0.5 % SOLN INSTILL 1 DROP IN BOTH EYES FOUR TIMES A DAY AS NEEDED    ? carvedilol (COREG) 25 MG tablet Take 1 tablet (25 mg total) by mouth 2 (two) times daily with a meal. 60 tablet 0  ? cetirizine (ZYRTEC) 10 MG tablet TAKE 1 TABLET(10 MG) BY MOUTH DAILY 30 tablet 5  ? cholecalciferol (VITAMIN D3) 25 MCG (1000 UNIT) tablet Take 1,000 Units by mouth daily.    ? clopidogrel (PLAVIX) 75 MG tablet Take 75 mg by mouth daily.    ? fluticasone (FLONASE) 50 MCG/ACT nasal spray Place 2 sprays into both nostrils daily. 16 g 5  ? Fluticasone-Umeclidin-Vilant (TRELEGY ELLIPTA) 100-62.5-25 MCG/INH AEPB Inhale 1 puff into the lungs daily at 6 (six) AM. 60 each 11  ? furosemide (LASIX) 20 MG tablet Take 20 mg by mouth daily.    ? hydrochlorothiazide (HYDRODIURIL) 25 MG tablet Take 25 mg by mouth 2 (two) times daily.    ? insulin regular human CONCENTRATED (HUMULIN R) 500 UNIT/ML kwikpen Inject 40-70 Units into the skin 3 (three) times daily as needed (high blood sugar).    ? Ipratropium-Albuterol (COMBIVENT RESPIMAT) 20-100 MCG/ACT AERS respimat Inhale 1 puff into the lungs 2 (two) times daily as needed for wheezing or shortness of breath.    ? ipratropium-albuterol (DUONEB) 0.5-2.5 (3) MG/3ML SOLN Take 3 mLs by nebulization every 6 (six) hours as needed (sob & wheezing).    ? levothyroxine (SYNTHROID, LEVOTHROID) 150 MCG tablet Take 150 mcg by mouth daily  before breakfast.    ? metFORMIN (GLUCOPHAGE) 500 MG tablet Take 500 mg by mouth 2 (two) times daily with a meal.    ? Multiple Vitamin (MULTIVITAMIN WITH MINERALS) TABS tablet Take 1 tablet by mouth daily.    ? rOPINIRole (REQUIP) 0.25 MG tablet Take 0.25 mg by mouth at bedtime.    ? rosuvastatin (CRESTOR) 40 MG tablet Take 40 mg by mouth daily.    ? tamsulosin (FLOMAX) 0.4 MG CAPS capsule Take 0.4 mg by mouth.    ? vitamin C (ASCORBIC ACID) 500 MG tablet Take 500 mg by mouth daily.    ? vitamin E 180 MG (400 UNITS) capsule Take 400 Units by mouth daily.    ? benzonatate (TESSALON) 200 MG capsule Take 1 capsule (200 mg total) by mouth 3 (three) times daily as needed for cough. 30 capsule 1  ? Fluticasone-Umeclidin-Vilant (TRELEGY ELLIPTA) 100-62.5-25 MCG/INH AEPB Inhale 1 puff into the lungs daily. 14 each 0  ? ?No facility-administered medications prior to visit.  ? ? ? ?Review of Systems:  ? ?Constitutional: No weight loss or gain, night sweats, fevers, chills, fatigue, or  lassitude. ?HEENT: No headaches, difficulty swallowing, tooth/dental problems, or sore throat. No itching, ear ache +nasal congestion, post nasal drip, sneezing ?CV:  No chest pain, orthopnea, PND, swelling in lower extremities, anasarca, dizziness, palpitations, syncope ?Resp: +shortness of breath with strenuous activity (at baseline); occasional cough, dry (improved). No excess mucus or change in color of mucus.  No hemoptysis. No wheezing.  No chest wall deformity ?GI:  No heartburn, indigestion, abdominal pain, nausea, vomiting, diarrhea, change in bowel habits, loss of appetite, bloody stools.  ?Skin: No rash, lesions, ulcerations ?MSK:  No joint pain or swelling.  No decreased range of motion.  + back pain. ?Neuro: No dizziness or lightheadedness.  ?Psych: No depression or anxiety. Mood stable.  ? ? ? ?Physical Exam: ? ?BP 122/60 (BP Location: Left Arm, Cuff Size: Large)   Pulse 67   Ht 6' (1.829 m)   Wt 214 lb 3.2 oz (97.2 kg)   SpO2  97%   BMI 29.05 kg/m?  ? ?GEN: Pleasant, interactive, well-appearing; in no acute distress ?HEENT:  Normocephalic and atraumatic. EACs patent left; cerumen impaction right. TM pearly gray with present light re

## 2022-03-28 NOTE — Patient Instructions (Addendum)
-  Continue your Trelegy 1 inhalation once daily.  Rinse and gargle after using. ?-Continue to use your DuoNeb up to every 6 hours as you need it for shortness of breath, chest tightness, wheezing. ?-Continue fluticasone nasal spray 2 sprays each nostril once daily.   ?-Use nasal saline rinses as needed ?-Continue supplemental 2 lpm at night and with activity as needed for goal SpO2 >88-90% ?-Keep your albuterol available use 2 puffs up to every 4 hours if needed for shortness of breath. ?  ?-Continue the over the counter sinus medicine you have been taking ?-Singulair 10 mg At bedtime for allergies  ?  ?Follow up in 3 months with Dr. Lamonte Sakai or Alanson Aly. If symptoms do not improve or worsen, please contact office for sooner follow up or seek emergency care. ?

## 2022-03-28 NOTE — Assessment & Plan Note (Addendum)
Resolved AECOPD. Suspect residual cough primarily upper airway irritation given exam findings and Symptoms. Continue current triple therapy regimen and PRN albuterol. Could consider transition from Trelegy to  [facility] if cough persists ? ?Patient Instructions  ?-Continue your Trelegy 1 inhalation once daily.  Rinse and gargle after using. ?-Continue to use your DuoNeb up to every 6 hours as you need it for shortness of breath, chest tightness, wheezing. ?-Continue fluticasone nasal spray 2 sprays each nostril once daily.   ?-Use nasal saline rinses as needed ?-Continue supplemental 2 lpm at night and with activity as needed for goal SpO2 >88-90% ?-Keep your albuterol available use 2 puffs up to every 4 hours if needed for shortness of breath. ?  ?-Continue the over the counter sinus medicine you have been taking ?-Singulair 10 mg At bedtime for allergies  ?  ?Follow up in 3 months with Dr. Lamonte Sakai or Alanson Aly. If symptoms do not improve or worsen, please contact office for sooner follow up or seek emergency care. ? ? ?

## 2022-03-28 NOTE — Assessment & Plan Note (Signed)
Oxygen stable on room air at visit. Continue supplemental 2 lpm at night and with activity as needed for goal SpO2 >88-90% 

## 2022-03-28 NOTE — Assessment & Plan Note (Signed)
Persistent symptoms; slightly improved. Mild elevation in eosinophils in past. Will start Singulair. Continue OTC allergy medicine and flonase for trigger prevention and postnasal drainage control. ?

## 2022-03-31 ENCOUNTER — Ambulatory Visit (INDEPENDENT_AMBULATORY_CARE_PROVIDER_SITE_OTHER)
Admission: RE | Admit: 2022-03-31 | Discharge: 2022-03-31 | Disposition: A | Discharge status: Home / Self Care | Payer: No Typology Code available for payment source | Source: Ambulatory Visit | Attending: Vascular Surgery | Admitting: Vascular Surgery

## 2022-03-31 ENCOUNTER — Encounter: Payer: Self-pay | Admitting: Vascular Surgery

## 2022-03-31 ENCOUNTER — Other Ambulatory Visit (HOSPITAL_COMMUNITY): Payer: Self-pay | Admitting: Vascular Surgery

## 2022-03-31 ENCOUNTER — Ambulatory Visit (INDEPENDENT_AMBULATORY_CARE_PROVIDER_SITE_OTHER): Payer: Medicare PPO | Admitting: Vascular Surgery

## 2022-03-31 ENCOUNTER — Ambulatory Visit (HOSPITAL_COMMUNITY)
Admission: RE | Admit: 2022-03-31 | Discharge: 2022-03-31 | Disposition: A | Discharge status: Home / Self Care | Payer: No Typology Code available for payment source | Source: Ambulatory Visit | Attending: Vascular Surgery | Admitting: Vascular Surgery

## 2022-03-31 VITALS — BP 119/58 | HR 75 | Temp 98.0°F | Resp 20 | Ht 72.0 in | Wt 214.0 lb

## 2022-03-31 DIAGNOSIS — I739 Peripheral vascular disease, unspecified: Secondary | ICD-10-CM

## 2022-03-31 DIAGNOSIS — I6522 Occlusion and stenosis of left carotid artery: Secondary | ICD-10-CM | POA: Diagnosis not present

## 2022-03-31 DIAGNOSIS — I6523 Occlusion and stenosis of bilateral carotid arteries: Secondary | ICD-10-CM

## 2022-03-31 NOTE — Progress Notes (Signed)
? ? ?REASON FOR VISIT:  ? ?Follow-up of peripheral arterial disease and left carotid stenosis ? ?MEDICAL ISSUES:  ? ?PERIPHERAL ARTERIAL DISEASE: This patient underwent aortofemoral bypass grafting in 2006 and then a left femoropopliteal bypass in 2019.  This was a Fem below-knee pop that required extension to the tibial peroneal trunk.  His grafts are all patent with essentially normal ABIs today.  He is asymptomatic today.  I encouraged him to stay as active as possible.  He is not a smoker. I have ordered a graft duplex in 1 year and follow-up ABIs in 1 year and I will see him back at that time.  Of note he is on Plavix and is on a statin. ? ?LEFT CAROTID STENOSIS: The patient's duplex scan today shows a less than 39% stenosis in the left ICA however there are elevated velocities in the distal common carotid artery with a peak systolic velocity of 425 cm/s.  Given that there is some stenosis in the distal common carotid artery I have ordered a follow-up carotid duplex scan in 1 year.  He knows to call sooner if he has problems. ? ?HPI:  ? ?Sean Saunders is a pleasant 76 y.o. male who I last saw on 02/10/2021.  I have been following him for peripheral arterial disease and left carotid stenosis.  He underwent aortofemoral bypass grafting in 2006.  In 2019 he had a left femoral to below-knee popliteal artery bypass that subsequently required extension to the tibial peroneal trunk.  He comes in for a 1 year follow-up visit.  Since I saw him last, he denies any history of claudication, rest pain, or nonhealing ulcers. ? ?In addition he had a 40 to 59% left carotid stenosis and I recommended a follow-up duplex in 1 year.  Since I saw him last, he denies any history of stroke, TIAs, expressive or receptive aphasia, or amaurosis fugax.  He does have a history of a brain tumor and is due for a follow-up study he tells me.  In looking to the records, I see an MRI from 2014 with showed he has a history of a left vestibular  schwannoma and a right frontal meningioma. ? ?Past Medical History:  ?Diagnosis Date  ? Arthritis   ? COPD (chronic obstructive pulmonary disease) ([place])   ? Diabetes mellitus   ? DVT (deep venous thrombosis) ([place])   ? GERD (gastroesophageal reflux disease)   ? Hyperlipidemia   ? Hypertension   ? Hypothyroidism   ? Onychomycosis 06/07/2013  ? Stroke [facility])   ? TIA's 01/05/11-01/08/11-01/26/11  ? TIA (transient ischemic attack) 08/03/2011  ? Left sided weakness. 3 episodes Feb-March  ? ? ?Family History  ?Problem Relation Age of Onset  ? Stroke Mother   ? Diabetes Mother   ? Other Father   ?     alzheimers  ? Diabetes Father   ? Stroke Father   ? Diabetes Sister   ? ? ?SOCIAL HISTORY: ?Social History  ? ?Tobacco Use  ? Smoking status: Former  ?  Packs/day: 1.00  ?  Years: 25.00  ?  Pack years: 25.00  ?  Types: Cigarettes  ?  Quit date: 06/05/2018  ?  Years since quitting: 3.8  ? Smokeless tobacco: Never  ?Substance Use Topics  ? Alcohol use: Not Currently  ?  Alcohol/week: 0.0 standard drinks  ?  Comment: rare  ? ? ?Allergies  ?Allergen Reactions  ? Eszopiclone Itching  ? Niaspan [Niacin Er] Nausea And Vomiting and  Rash  ? Other Hives and Nausea And Vomiting  ?  Ozempic and   ? ? ?Current Outpatient Medications  ?Medication Sig Dispense Refill  ? albuterol (VENTOLIN HFA) 108 (90 Base) MCG/ACT inhaler Inhale 2 puffs into the lungs every 6 (six) hours as needed for wheezing or shortness of breath.    ? allopurinol (ZYLOPRIM) 100 MG tablet Take 100 mg by mouth 2 (two) times daily.    ? cetirizine (ZYRTEC) 10 MG tablet TAKE 1 TABLET(10 MG) BY MOUTH DAILY 30 tablet 5  ? clopidogrel (PLAVIX) 75 MG tablet Take 75 mg by mouth daily.    ? empagliflozin (JARDIANCE) 25 MG TABS tablet Take 1 tablet by mouth daily.    ? finasteride (PROSCAR) 5 MG tablet TAKE ONE TABLET BY MOUTH DAILY FOR ENLARGED PROSTATE. STOP IF ALLERGIC SYMPTOMS AND OR BREAST ENLARGEMENT OR TENDERNESS    ? fluticasone (FLONASE) 50 MCG/ACT nasal spray Place 2 sprays  into both nostrils daily. 16 g 5  ? furosemide (LASIX) 20 MG tablet Take 20 mg by mouth daily.    ? hydrochlorothiazide (HYDRODIURIL) 25 MG tablet Take 25 mg by mouth 2 (two) times daily.    ? insulin glargine (LANTUS) 100 UNIT/ML injection Inject 60 Units into the skin 2 (two) times daily. 30 units at night    ? insulin regular human CONCENTRATED (HUMULIN R) 500 UNIT/ML kwikpen Inject 40-70 Units into the skin 3 (three) times daily as needed (high blood sugar).    ? Ipratropium-Albuterol (COMBIVENT RESPIMAT) 20-100 MCG/ACT AERS respimat Inhale 1 puff into the lungs 2 (two) times daily as needed for wheezing or shortness of breath.    ? ipratropium-albuterol (DUONEB) 0.5-2.5 (3) MG/3ML SOLN Take 3 mLs by nebulization every 6 (six) hours as needed (sob & wheezing).    ? latanoprost (XALATAN) 0.005 % ophthalmic solution INSTILL 1 DROP IN BOTH EYES AT BEDTIME    ? levothyroxine (SYNTHROID, LEVOTHROID) 150 MCG tablet Take 150 mcg by mouth daily before breakfast.    ? metFORMIN (GLUCOPHAGE) 500 MG tablet Take 500 mg by mouth 2 (two) times daily with a meal.    ? montelukast (SINGULAIR) 10 MG tablet Take 1 tablet (10 mg total) by mouth at bedtime. 30 tablet 5  ? Multiple Vitamin (MULTIVITAMIN WITH MINERALS) TABS tablet Take 1 tablet by mouth daily.    ? rOPINIRole (REQUIP) 0.25 MG tablet Take 0.25 mg by mouth at bedtime.    ? rosuvastatin (CRESTOR) 40 MG tablet Take 40 mg by mouth daily.    ? tamsulosin (FLOMAX) 0.4 MG CAPS capsule Take 0.4 mg by mouth.    ? vitamin C (ASCORBIC ACID) 500 MG tablet Take 500 mg by mouth daily.    ? vitamin E 180 MG (400 UNITS) capsule Take 400 Units by mouth daily.    ? amLODipine (NORVASC) 10 MG tablet Take 1 tablet (10 mg total) by mouth daily. 30 tablet 0  ? carvedilol (COREG) 25 MG tablet Take 1 tablet (25 mg total) by mouth 2 (two) times daily with a meal. 60 tablet 0  ? cholecalciferol (VITAMIN D3) 25 MCG (1000 UNIT) tablet Take 1,000 Units by mouth daily. (Patient not taking:  Reported on 03/31/2022)    ? Fluticasone-Umeclidin-Vilant (TRELEGY ELLIPTA) 100-62.5-25 MCG/INH AEPB Inhale 1 puff into the lungs daily at 6 (six) AM. (Patient not taking: Reported on 03/31/2022) 60 each 11  ? Fluticasone-Umeclidin-Vilant (TRELEGY ELLIPTA) 100-62.5-25 MCG/INH AEPB Inhale 1 puff into the lungs daily. (Patient not taking: Reported on 03/31/2022) 14  each 0  ? ?No current facility-administered medications for this visit.  ? ? ?REVIEW OF SYSTEMS:  ?'[X]'$  denotes positive finding, '[ ]'$  denotes negative finding ?Cardiac  Comments:  ?Chest pain or chest pressure:    ?Shortness of breath upon exertion:    ?Short of breath when lying flat:    ?Irregular heart rhythm:    ?    ?Vascular    ?Pain in calf, thigh, or hip brought on by ambulation:    ?Pain in feet at night that wakes you up from your sleep:     ?Blood clot in your veins:    ?Leg swelling:     ?    ?Pulmonary    ?Oxygen at home:    ?Productive cough:     ?Wheezing:     ?    ?Neurologic    ?Sudden weakness in arms or legs:     ?Sudden numbness in arms or legs:     ?Sudden onset of difficulty speaking or slurred speech:    ?Temporary loss of vision in one eye:     ?Problems with dizziness:     ?    ?Gastrointestinal    ?Blood in stool:     ?Vomited blood:     ?    ?Genitourinary    ?Burning when urinating:     ?Blood in urine:    ?    ?Psychiatric    ?Major depression:     ?    ?Hematologic    ?Bleeding problems:    ?Problems with blood clotting too easily:    ?    ?Skin    ?Rashes or ulcers:    ?    ?Constitutional    ?Fever or chills:    ? ?PHYSICAL EXAM:  ? ?Vitals:  ? 03/31/22 1249 03/31/22 1253  ?BP: (!) 117/53 (!) 119/58  ?Pulse: 75   ?Resp: 20   ?Temp: 98 ?F (36.7 ?C)   ?SpO2: 94%   ?Weight: 214 lb (97.1 kg)   ?Height: 6' (1.829 m)   ? ? ?GENERAL: The patient is a well-nourished male, in no acute distress. The vital signs are documented above. ?CARDIAC: There is a regular rate and rhythm.  ?VASCULAR: He has bilateral carotid bruits. ?He has palpable  femoral pulses and palpable posterior tibial pulses bilaterally. ?He does have hyperpigmentation bilaterally consistent with chronic venous insufficiency. ?PULMONARY: There is good air exchange bilaterally wi

## 2022-04-11 DIAGNOSIS — D2271 Melanocytic nevi of right lower limb, including hip: Secondary | ICD-10-CM | POA: Diagnosis not present

## 2022-04-11 DIAGNOSIS — D225 Melanocytic nevi of trunk: Secondary | ICD-10-CM | POA: Diagnosis not present

## 2022-04-11 DIAGNOSIS — L570 Actinic keratosis: Secondary | ICD-10-CM | POA: Diagnosis not present

## 2022-04-11 DIAGNOSIS — D485 Neoplasm of uncertain behavior of skin: Secondary | ICD-10-CM | POA: Diagnosis not present

## 2022-04-11 DIAGNOSIS — L821 Other seborrheic keratosis: Secondary | ICD-10-CM | POA: Diagnosis not present

## 2022-04-11 DIAGNOSIS — D692 Other nonthrombocytopenic purpura: Secondary | ICD-10-CM | POA: Diagnosis not present

## 2022-04-11 DIAGNOSIS — L814 Other melanin hyperpigmentation: Secondary | ICD-10-CM | POA: Diagnosis not present

## 2022-04-11 DIAGNOSIS — Z85828 Personal history of other malignant neoplasm of skin: Secondary | ICD-10-CM | POA: Diagnosis not present

## 2022-04-11 DIAGNOSIS — D1801 Hemangioma of skin and subcutaneous tissue: Secondary | ICD-10-CM | POA: Diagnosis not present

## 2022-04-13 DIAGNOSIS — S0082XA Blister (nonthermal) of other part of head, initial encounter: Secondary | ICD-10-CM | POA: Diagnosis not present

## 2022-04-18 ENCOUNTER — Encounter: Payer: Self-pay | Admitting: Physical Medicine & Rehabilitation

## 2022-04-18 ENCOUNTER — Encounter
Payer: No Typology Code available for payment source | Attending: Physical Medicine & Rehabilitation | Admitting: Physical Medicine & Rehabilitation

## 2022-04-18 VITALS — BP 165/84 | HR 65 | Ht 72.0 in | Wt 216.8 lb

## 2022-04-18 DIAGNOSIS — E1140 Type 2 diabetes mellitus with diabetic neuropathy, unspecified: Secondary | ICD-10-CM | POA: Insufficient documentation

## 2022-04-18 DIAGNOSIS — M5459 Other low back pain: Secondary | ICD-10-CM | POA: Insufficient documentation

## 2022-04-18 MED ORDER — DULOXETINE HCL 30 MG PO CPEP: 30.0000 mg | ORAL_CAPSULE | Freq: Every day | ORAL | 2 refills | Status: DC

## 2022-04-18 NOTE — Progress Notes (Signed)
? ?Subjective:  ? ? Patient ID: Sean Saunders, male    DOB: 10/02/1946, 76 y.o.   MRN: [id number] ? ?HPI ?Sean Saunders is a 76 year old male with past medical history of PVD,CKD, DM with Neuropathy, hypertension COPD who is here for back pain.  Patient reports his back began hurting after he was working outside on his deck.  No pain radiating down his legs.  There was slight improvement initially but since then pain has been constant since his injury about 2 months ago.  Pain is dull and aching.  It interferes with him being able to complete tasks around the house.  No bowel or bladder changes.  No weakness or sensory changes.  He tried Aleve and gabapentin without benefit.  He was seen by the [place] where he had an x-ray completed with impression of advanced facet joint changes in L-spine and a possible age-indeterminate compression fracture T12. ? ? ?Pain Inventory ?Average Pain 8 ?Pain Right Now 8 ?My pain is constant, sharp, stabbing, and aching ? ?In the last 24 hours, has pain interfered with the following? ?General activity 6 ?Relation with others 0 ?Enjoyment of life 8 ?What TIME of day is your pain at its worst? morning , daytime, evening, and night ?Sleep (in general) Poor ? ?Pain is worse with: walking, bending, sitting, and standing ?Pain improves with: rest, heat/ice, and medication ?Relief from Meds: 2 ? ?walk without assistance ?how many minutes can you walk? 30 ?ability to climb steps?  yes ?do you drive?  yes ? ?not employed: date last employed 2018 ?Do you have any goals in this area?  no ? ?trouble walking ? ?Any changes since last visit?  no ? ?Any changes since last visit?  no ? ? ? ?Family History  ?Problem Relation Age of Onset  ? Stroke Mother   ? Diabetes Mother   ? Other Father   ?     alzheimers  ? Diabetes Father   ? Stroke Father   ? Diabetes Sister   ? ?Social History  ? ?Socioeconomic History  ? Marital status: Widowed  ?  Spouse name: Not on file  ? Number of children: 1  ? Years of  education: Not on file  ? Highest education level: Master's degree (e.g., MA, MS, MEng, MEd, MSW, MBA)  ?Occupational History  ? Occupation: retired  ?Tobacco Use  ? Smoking status: Former  ?  Packs/day: 1.00  ?  Years: 25.00  ?  Pack years: 25.00  ?  Types: Cigarettes  ?  Quit date: 06/05/2018  ?  Years since quitting: 3.8  ? Smokeless tobacco: Never  ?Vaping Use  ? Vaping Use: Never used  ?Substance and Sexual Activity  ? Alcohol use: Not Currently  ?  Alcohol/week: 0.0 standard drinks  ?  Comment: rare  ? Drug use: No  ? Sexual activity: Not on file  ?Other Topics Concern  ? Not on file  ?Social History Narrative  ? Not on file  ? ?Social Determinants of Health  ? ?Financial Resource Strain: Not on file  ?Food Insecurity: Not on file  ?Transportation Needs: Not on file  ?Physical Activity: Not on file  ?Stress: Not on file  ?Social Connections: Not on file  ? ?Past Surgical History:  ?Procedure Laterality Date  ? ABDOMINAL AORTAGRAM N/A 06/04/2012  ? Procedure: ABDOMINAL AORTAGRAM;  Surgeon: Angelia Mould, MD;  Location: [facility] CATH LAB;  Service: Cardiovascular;  Laterality: N/A;  ? APPENDECTOMY    ? COLONOSCOPY    ?  FEMORAL-POPLITEAL BYPASS GRAFT  07/05/2012  ? Procedure: BYPASS GRAFT FEMORAL-POPLITEAL ARTERY;  Surgeon: Angelia Mould, MD;  Location: [facility] OR;  Service: Vascular;  Laterality: Left;  Revision Left fem/pop w/ artery patch angioplasty utilizing left GSV graft and extenion to tib/per trunk w/right GSV and interop arteriogram  ? JOINT REPLACEMENT  2011  ? -Hip  ? NASAL SINUS SURGERY    ? PR VEIN BYPASS GRAFT,AORTO-FEM-POP    ? PR VEIN BYPASS GRAFT,AORTO-FEM-POP  08-01-11  ? (L) Fem-pop BP  ? toe nail    ? TONSILLECTOMY    ? ?Past Medical History:  ?Diagnosis Date  ? Arthritis   ? COPD (chronic obstructive pulmonary disease) ([place])   ? Diabetes mellitus   ? DVT (deep venous thrombosis) ([place])   ? GERD (gastroesophageal reflux disease)   ? Hyperlipidemia   ? Hypertension   ? Hypothyroidism   ?  Onychomycosis 06/07/2013  ? Stroke [facility])   ? TIA's 01/05/11-01/08/11-01/26/11  ? TIA (transient ischemic attack) 08/03/2011  ? Left sided weakness. 3 episodes Feb-March  ? ?BP (!) 165/84   Pulse 65   Ht 6' (1.829 m)   Wt 216 lb 12.8 oz (98.3 kg)   SpO2 97%   BMI 29.40 kg/m?  ? ?Opioid Risk Score:   ?Fall Risk Score:  `1 ? ?Depression screen PHQ 2/9 ? ? ?  04/18/2022  ? 10:44 AM 10/22/2020  ? 10:43 AM 08/25/2020  ?  2:38 PM 08/11/2020  ?  9:14 AM 09/17/2018  ? 11:16 AM  ?Depression screen PHQ 2/9  ?Decreased Interest 3 1 0 3 0  ?Down, Depressed, Hopeless 2 0 0 2 0  ?PHQ - 2 Score 5 1 0 5 0  ?Altered sleeping 3 3  0   ?Tired, decreased energy '2 1  3   '$ ?Change in appetite '3 1  3   '$ ?Feeling bad or failure about yourself  0 0  0   ?Trouble concentrating 1 0  0   ?Moving slowly or fidgety/restless 0 0  0   ?Suicidal thoughts 0 0  0   ?PHQ-9 Score '14 6  11   '$ ?Difficult doing work/chores Somewhat difficult Somewhat difficult  Not difficult at all   ?  ? ?Review of Systems  ?Constitutional: Negative.   ?HENT: Negative.    ?Eyes: Negative.   ?Respiratory:  Positive for shortness of breath.   ?Cardiovascular: Negative.   ?Gastrointestinal: Negative.   ?Endocrine: Negative.   ?Genitourinary: Negative.   ?Musculoskeletal:  Positive for back pain and gait problem.  ?Skin: Negative.   ?Allergic/Immunologic: Negative.   ?Hematological: Negative.   ?Psychiatric/Behavioral:  Positive for sleep disturbance.   ? ?   ?Objective:  ? Physical Exam ?Gen: no distress, normal appearing ?HEENT: oral mucosa pink and moist, NCAT ?Cardio: Reg rate ?Chest: normal effort, normal rate of breathing ?Abd: soft, non-distended ?Ext: no edema ?Psych: pleasant, normal affect ?Skin: intact ?Neuro: Alert and oriented follows commands cranial nerves II through XII grossly intact strength 5 out of 5 in all 4 extremities.  DTR 2+ at the knees, 1+ at the ankles bilaterally, sensation intact to light touch in all 4 extremities. ?Musculoskeletal: Decreased lumbar  motion in all directions increased pain with lumbar extension.  Pain worse with extension. Equivocal facet loading.  No joint swelling.  SLR are negative b/l.  No pain with hip internal or external rotation.  Paraspinal lumbar tenderness to palpation from L1-L5. Gait antalgic. ? ? ?   ?Lab Results  ?Component Value  Date  ? CREATININE 1.70 (H) 05/02/2021  ? CREATININE 1.84 (H) 05/01/2021  ? CREATININE 2.23 (H) 04/30/2021  ? ? ? ?Assessment & Plan:  ? ?Subacute low back pain likely due to multilevel facet joint arthropathy also possible compression fraction noted on x-ray L spine. No radicular symptoms. ?-We will order duloxetine 30 mg daily ?-Consider addition of muscle relaxer if pain does not adequately improve with Cymbalta ?-Physical therapy may be an good next option if pain persists ? ?DM type 2 with polyneuropathy ? -Duloxetine may provide benefit for symptoms as well ? ?

## 2022-05-04 ENCOUNTER — Telehealth: Payer: Self-pay

## 2022-05-04 NOTE — Telephone Encounter (Signed)
LVM instructions for pt to call office to schedule CPE. ?

## 2022-05-05 DIAGNOSIS — D329 Benign neoplasm of meninges, unspecified: Secondary | ICD-10-CM | POA: Diagnosis not present

## 2022-05-05 DIAGNOSIS — Z6829 Body mass index (BMI) 29.0-29.9, adult: Secondary | ICD-10-CM | POA: Diagnosis not present

## 2022-06-06 ENCOUNTER — Ambulatory Visit: Payer: No Typology Code available for payment source | Admitting: Physical Medicine & Rehabilitation

## 2022-06-15 DIAGNOSIS — L84 Corns and callosities: Secondary | ICD-10-CM | POA: Diagnosis not present

## 2022-06-15 DIAGNOSIS — I739 Peripheral vascular disease, unspecified: Secondary | ICD-10-CM | POA: Diagnosis not present

## 2022-06-15 DIAGNOSIS — E1151 Type 2 diabetes mellitus with diabetic peripheral angiopathy without gangrene: Secondary | ICD-10-CM | POA: Diagnosis not present

## 2022-06-15 DIAGNOSIS — L603 Nail dystrophy: Secondary | ICD-10-CM | POA: Diagnosis not present

## 2022-06-27 ENCOUNTER — Ambulatory Visit (INDEPENDENT_AMBULATORY_CARE_PROVIDER_SITE_OTHER): Payer: Medicare PPO | Admitting: Nurse Practitioner

## 2022-06-27 ENCOUNTER — Encounter: Payer: Self-pay | Admitting: Nurse Practitioner

## 2022-06-27 VITALS — BP 132/62 | HR 78 | Temp 98.0°F | Ht 69.0 in | Wt 213.8 lb

## 2022-06-27 DIAGNOSIS — J9611 Chronic respiratory failure with hypoxia: Secondary | ICD-10-CM

## 2022-06-27 DIAGNOSIS — F17200 Nicotine dependence, unspecified, uncomplicated: Secondary | ICD-10-CM

## 2022-06-27 DIAGNOSIS — J449 Chronic obstructive pulmonary disease, unspecified: Secondary | ICD-10-CM | POA: Diagnosis not present

## 2022-06-27 DIAGNOSIS — J4489 Other specified chronic obstructive pulmonary disease: Secondary | ICD-10-CM

## 2022-06-27 DIAGNOSIS — J309 Allergic rhinitis, unspecified: Secondary | ICD-10-CM

## 2022-06-27 NOTE — Assessment & Plan Note (Signed)
Compensated on current regimen. Struggles with heat/humidity at times but otherwise doing well. He completed pulmonary rehab previously. He knows the exercises he needs to do but he doesn't always take the time to do them. Every now and then, he will practice his breathing techniques. No changes to current regimen - continue Trelegy and PRN albuterol. Continue singulair for trigger prevention.  Patient Instructions  -Continue your Trelegy 1 inhalation once daily.  Rinse and gargle after using. -Continue to use your DuoNeb up to every 6 hours as you need it for shortness of breath, chest tightness, wheezing. -Continue fluticasone nasal spray 2 sprays each nostril once daily.   -Use nasal saline rinses as needed -Continue supplemental 2 lpm at night and with activity as needed for goal SpO2 >88-90% -Keep your albuterol available use 2 puffs up to every 4 hours if needed for shortness of breath. -Continue the over the counter sinus medicine you have been taking - Continue Singulair 10 mg At bedtime for allergies   Referred to lung cancer screening program    Follow up in 6 months with Dr. Lamonte Sakai. If symptoms do not improve or worsen, please contact office for sooner follow up or seek emergency care.

## 2022-06-27 NOTE — Assessment & Plan Note (Signed)
Oxygen stable on room air at visit. Continue supplemental 2 lpm at night and with activity as needed for goal SpO2 >88-90%

## 2022-06-27 NOTE — Progress Notes (Signed)
$'@Patient'X$  ID: Sean Saunders, male    DOB: 09-11-1946, 76 y.o.   MRN: [id number]  Chief Complaint  Patient presents with   Follow-up    Follow up. Patient says he is still dealing with his sinuses.     Referring provider: [place], Betty G, MD  HPI: 76 year old male, former smoker (25 pack years) followed for COPD, chronic respiratory failure and allergic rhinitis. He is a patient of Dr. Agustina Caroli and last seen in office on 03/28/2022. Past medical history significant for PVD, HTN, previous DVT, DM 2, CKD stage III.   TEST/EVENTS:  05/22/2018 PFTs: FVC 3.02 (64), FEV1 1.8 (52), ratio 59, TLC 91%, DLCOunc 51%.  Moderate obstructive airway disease with diffusion defect without bronchodilator response. 06/27/2020 CT chest without contrast: Minimal pericardial effusion noted.  Mild bilateral bullous changes and centrilobular distribution.  There is no acute process noted 02/24/2022 CXR 2 view: Both lungs were clear.  There was no acute process noted.  11/26/2021: OV with Dr. Lamonte Sakai.  Symptoms mostly stable.  Continued on triple therapy with Trelegy.  Allergic rhinitis well-controlled on Zyrtec and Flonase.  Advised to use saline nasal rinses as needed.  Patient was previously ordered noninvasive ventilation at night however was rejected by insurance and DME despite documentation that he would benefit.  Advised to continue using oxygen at night and during the day at 2 to 3 L depending on level of exertion.  02/24/2022: OV with Ahmani Daoud NP. Acute symptoms with cough and increased nasal drainage. Mild increased in DOE. Improvement in symptoms with OTC sinus medicine. Tx with pred taper and doxy course. Continued Zyrtec and flonase for trigger prevention and postnasal drainage control. Advised to continue supplemental O2 2 lpm with activity and at night - no O2 requirement at visit.  03/28/2022: OV with [name] NP for follow up after being treated for mild AECOPD. Still having some occasional nasal congestion/cough  but overall, improved. Feels like the over the counter sinus meds he's taking resolved his symptoms. Enjoys being outside but feels like the pollen triggers his symptoms. He denies any increased SOB, wheezing, fevers. Continues on Trelegy daily. Rare use of rescue. He continues on flonase nasal spray and over the counter allergy medicine. Started him on singulair qhs. Oxygen has been stable at home without need for O2 with activity; wears nightly. Awaiting appt with ortho to discuss back pain/possible compression fracture in t spine.   06/27/2022: Today - follow up Patient presents today for follow up. He reports that he has been about the same since we saw him last. He does feel like the smoke and muggy heat have caused him to feel a little more winded when he is out running errands, but otherwise, he has been doing okay. He does feel like the singulair has helped his sinus symptoms. He has an occasional cough, which is unchanged from his baseline and rarely productive. He denies any fevers, night sweats, anorexia, weight loss, hemoptysis. He continues on Trelegy. Rarely uses his rescue. HE uses flonase and OTC antihistamine daily. He hasn't needed his oxygen at home during the day; wears it nightly. Pain is better in his back. He recently paid off his mortgage, which he is excited about.   Allergies  Allergen Reactions   Eszopiclone Itching   Niaspan [Niacin Er] Nausea And Vomiting and Rash   Other Hives and Nausea And Vomiting    Ozempic and     Immunization History  Administered Date(s) Administered   Fluad Quad(high  Dose 65+) 08/13/2019, 08/31/2020, 09/01/2021   Influenza Split 09/11/2014, 08/15/2016   Influenza, High Dose Seasonal PF 08/30/2011, 09/11/2013, 09/11/2017, 09/25/2017, 09/17/2018, 10/11/2019   Influenza,trivalent, recombinat, inj, PF 09/01/2016   Influenza-Unspecified 08/30/2011, 10/25/2012, 11/11/2013, 08/12/2020, 09/11/2021   PFIZER Comirnaty(Gray Top)Covid-19 Tri-Sucrose  Vaccine 01/20/2021   PFIZER(Purple Top)SARS-COV-2 Vaccination 02/13/2020, 03/11/2020   Pneumococcal Conjugate-13 02/27/2018   Pneumococcal Polysaccharide-23 11/08/2019   Pneumococcal-Unspecified 12/12/2009   Tdap 10/25/2012, 12/11/2018    Past Medical History:  Diagnosis Date   Arthritis    COPD (chronic obstructive pulmonary disease) ([place])    Diabetes mellitus    DVT (deep venous thrombosis) ([facility])    GERD (gastroesophageal reflux disease)    Hyperlipidemia    Hypertension    Hypothyroidism    Onychomycosis 06/07/2013   Stroke ([place])    TIA's 01/05/11-01/08/11-01/26/11   TIA (transient ischemic attack) 08/03/2011   Left sided weakness. 3 episodes Feb-March    Tobacco History: Social History   Tobacco Use  Smoking Status Former   Packs/day: 1.00   Years: 25.00   Total pack years: 25.00   Types: Cigarettes   Quit date: 06/05/2018   Years since quitting: 4.0  Smokeless Tobacco Never   Counseling given: Not Answered   Outpatient Medications Prior to Visit  Medication Sig Dispense Refill   albuterol (VENTOLIN HFA) 108 (90 Base) MCG/ACT inhaler Inhale 2 puffs into the lungs every 6 (six) hours as needed for wheezing or shortness of breath.     allopurinol (ZYLOPRIM) 100 MG tablet Take 100 mg by mouth 2 (two) times daily.     cetirizine (ZYRTEC) 10 MG tablet TAKE 1 TABLET(10 MG) BY MOUTH DAILY 30 tablet 5   clopidogrel (PLAVIX) 75 MG tablet Take 75 mg by mouth daily.     DULoxetine (CYMBALTA) 30 MG capsule Take 1 capsule (30 mg total) by mouth daily. 30 capsule 2   empagliflozin (JARDIANCE) 25 MG TABS tablet Take 1 tablet by mouth daily.     finasteride (PROSCAR) 5 MG tablet TAKE ONE TABLET BY MOUTH DAILY FOR ENLARGED PROSTATE. STOP IF ALLERGIC SYMPTOMS AND OR BREAST ENLARGEMENT OR TENDERNESS     fluticasone (FLONASE) 50 MCG/ACT nasal spray Place 2 sprays into both nostrils daily. 16 g 5   furosemide (LASIX) 20 MG tablet Take 20 mg by mouth daily.     hydrochlorothiazide  (HYDRODIURIL) 25 MG tablet Take 25 mg by mouth 2 (two) times daily.     insulin glargine (LANTUS) 100 UNIT/ML injection Inject 60 Units into the skin 2 (two) times daily. 30 units at night     insulin regular human CONCENTRATED (HUMULIN R) 500 UNIT/ML kwikpen Inject 40-70 Units into the skin 3 (three) times daily as needed (high blood sugar).     Ipratropium-Albuterol (COMBIVENT RESPIMAT) 20-100 MCG/ACT AERS respimat Inhale 1 puff into the lungs 2 (two) times daily as needed for wheezing or shortness of breath.     ipratropium-albuterol (DUONEB) 0.5-2.5 (3) MG/3ML SOLN Take 3 mLs by nebulization every 6 (six) hours as needed (sob & wheezing).     latanoprost (XALATAN) 0.005 % ophthalmic solution INSTILL 1 DROP IN BOTH EYES AT BEDTIME     levothyroxine (SYNTHROID, LEVOTHROID) 150 MCG tablet Take 150 mcg by mouth daily before breakfast.     metFORMIN (GLUCOPHAGE) 500 MG tablet Take 500 mg by mouth 2 (two) times daily with a meal.     montelukast (SINGULAIR) 10 MG tablet Take 1 tablet (10 mg total) by mouth at bedtime. 30 tablet 5  Multiple Vitamin (MULTIVITAMIN WITH MINERALS) TABS tablet Take 1 tablet by mouth daily.     rOPINIRole (REQUIP) 0.25 MG tablet Take 0.25 mg by mouth at bedtime.     rosuvastatin (CRESTOR) 40 MG tablet Take 40 mg by mouth daily.     tamsulosin (FLOMAX) 0.4 MG CAPS capsule Take 0.4 mg by mouth.     vitamin C (ASCORBIC ACID) 500 MG tablet Take 500 mg by mouth daily.     vitamin E 180 MG (400 UNITS) capsule Take 400 Units by mouth daily.     amLODipine (NORVASC) 10 MG tablet Take 1 tablet (10 mg total) by mouth daily. 30 tablet 0   carvedilol (COREG) 25 MG tablet Take 1 tablet (25 mg total) by mouth 2 (two) times daily with a meal. 60 tablet 0   cholecalciferol (VITAMIN D3) 25 MCG (1000 UNIT) tablet Take 1,000 Units by mouth daily. (Patient not taking: Reported on 03/31/2022)     Fluticasone-Umeclidin-Vilant (TRELEGY ELLIPTA) 100-62.5-25 MCG/INH AEPB Inhale 1 puff into the  lungs daily at 6 (six) AM. (Patient not taking: Reported on 03/31/2022) 60 each 11   Fluticasone-Umeclidin-Vilant (TRELEGY ELLIPTA) 100-62.5-25 MCG/INH AEPB Inhale 1 puff into the lungs daily. (Patient not taking: Reported on 03/31/2022) 14 each 0   No facility-administered medications prior to visit.     Review of Systems:   Constitutional: No weight loss or gain, night sweats, fevers, chills, fatigue, or lassitude. HEENT: No headaches, difficulty swallowing, tooth/dental problems, or sore throat. No itching, ear ache +nasal congestion, post nasal drip, sneezing (improved) CV:  No chest pain, orthopnea, PND, swelling in lower extremities, anasarca, dizziness, palpitations, syncope Resp: +shortness of breath with strenuous activity (at baseline); occasional cough (baseline). No excess mucus or change in color of mucus.  No hemoptysis. No wheezing.  No chest wall deformity GI:  No heartburn, indigestion, abdominal pain, nausea, vomiting, diarrhea, change in bowel habits, loss of appetite, bloody stools.  Skin: No rash, lesions, ulcerations MSK:  No joint pain or swelling.  No decreased range of motion.  + back pain (improved) Neuro: No dizziness or lightheadedness.  Psych: No depression or anxiety. Mood stable.     Physical Exam:  BP 132/62 (BP Location: Right Arm, Patient Position: Sitting, Cuff Size: Large)   Pulse 78   Temp 98 F (36.7 C) (Oral)   Ht '5\' 9"'$  (1.753 m)   Wt 213 lb 12.8 oz (97 kg)   SpO2 96%   BMI 31.57 kg/m   GEN: Pleasant, interactive, well-appearing; in no acute distress HEENT:  Normocephalic and atraumatic. EACs patent left; cerumen impaction right. TM pearly gray with present light reflex bilaterally. PERRLA. Sclera white. Nasal turbinates erythematous, moist and patent bilaterally. No rhinorrhea present. Oropharynx pink and moist, without exudate or edema. No lesions, ulcerations, or postnasal drip.  NECK:  Supple w/ fair ROM. No JVD present.  CV: RRR, no m/r/g,  no peripheral edema. Pulses intact, +2 bilaterally. No cyanosis, pallor or clubbing. PULMONARY:  Unlabored, regular breathing. Diminished posteriorly without wheezes/rales/rhonchi. No accessory muscle use. No dullness to percussion. GI: BS present and normoactive. Soft, non-tender to palpation. No organomegaly or masses detected. No CVA tenderness. MSK: No erythema, warmth or tenderness.  Neuro: A/Ox3. No focal deficits noted.   Skin: Warm, no lesions or rashe Psych: Normal affect and behavior. Judgement and thought content appropriate.     Lab Results:  CBC    Component Value Date/Time   WBC 13.4 (H) 05/01/2021 0803   RBC 4.16 (L)  05/01/2021 0803   HGB 11.9 (L) 05/01/2021 0803   HCT 37.9 (L) 05/01/2021 0803   PLT 225 05/01/2021 0803   MCV 91.1 05/01/2021 0803   MCH 28.6 05/01/2021 0803   MCHC 31.4 05/01/2021 0803   RDW 15.0 05/01/2021 0803   LYMPHSABS 5.1 (H) 04/30/2021 1343   MONOABS 0.8 04/30/2021 1343   EOSABS 0.3 04/30/2021 1343   BASOSABS 0.1 04/30/2021 1343    BMET    Component Value Date/Time   NA 136 05/02/2021 0645   K 5.0 05/02/2021 0645   CL 107 05/02/2021 0645   CO2 24 05/02/2021 0645   GLUCOSE 208 (H) 05/02/2021 0645   BUN 59 (H) 05/02/2021 0645   CREATININE 1.70 (H) 05/02/2021 0645   CREATININE 1.26 07/31/2013 1018   CALCIUM 9.8 05/02/2021 0645   GFRNONAA 42 (L) 05/02/2021 0645   GFRAA >60 07/01/2020 0433    BNP    Component Value Date/Time   BNP 98.3 06/27/2020 0509     Imaging:  No results found.       Latest Ref Rng & Units 05/22/2018   10:09 AM  PFT Results  FVC-Pre L 2.89   FVC-Predicted Pre % 61   FVC-Post L 3.02   FVC-Predicted Post % 64   Pre FEV1/FVC % % 59   Post FEV1/FCV % % 59   FEV1-Pre L 1.70   FEV1-Predicted Pre % 49   FEV1-Post L 1.80   DLCO uncorrected ml/min/mmHg 18.21   DLCO UNC% % 51   DLVA Predicted % 66   TLC L 6.79   TLC % Predicted % 91   RV % Predicted % 137     No results found for:  "NITRICOXIDE"      Assessment & Plan:   COPD with chronic bronchitis and emphysema (HCC) Compensated on current regimen. Struggles with heat/humidity at times but otherwise doing well. He completed pulmonary rehab previously. He knows the exercises he needs to do but he doesn't always take the time to do them. Every now and then, he will practice his breathing techniques. No changes to current regimen - continue Trelegy and PRN albuterol. Continue singulair for trigger prevention.  Patient Instructions  -Continue your Trelegy 1 inhalation once daily.  Rinse and gargle after using. -Continue to use your DuoNeb up to every 6 hours as you need it for shortness of breath, chest tightness, wheezing. -Continue fluticasone nasal spray 2 sprays each nostril once daily.   -Use nasal saline rinses as needed -Continue supplemental 2 lpm at night and with activity as needed for goal SpO2 >88-90% -Keep your albuterol available use 2 puffs up to every 4 hours if needed for shortness of breath. -Continue the over the counter sinus medicine you have been taking - Continue Singulair 10 mg At bedtime for allergies   Referred to lung cancer screening program    Follow up in 6 months with Dr. Lamonte Sakai. If symptoms do not improve or worsen, please contact office for sooner follow up or seek emergency care.    Chronic respiratory failure with hypoxia (HCC) Oxygen stable on room air at visit. Continue supplemental 2 lpm at night and with activity as needed for goal SpO2 >88-90%  Rhinitis, allergic Improved with addition of singulair. Continue flonase and OTC antihistamine.   Tobacco use disorder Former heavy smoker. Quit 2019. Last CT chest in 2021. Referred to lung cancer screening program today.     I spent 28 minutes of dedicated to the care of this patient  on the date of this encounter to include pre-visit review of records, face-to-face time with the patient discussing conditions above, post visit  ordering of testing, clinical documentation with the electronic health record, making appropriate referrals as documented, and communicating necessary findings to members of the patients care team.  Clayton Bibles, NP 06/27/2022  Pt aware and understands NP's role.

## 2022-06-27 NOTE — Patient Instructions (Addendum)
-  Continue your Trelegy 1 inhalation once daily.  Rinse and gargle after using. -Continue to use your DuoNeb up to every 6 hours as you need it for shortness of breath, chest tightness, wheezing. -Continue fluticasone nasal spray 2 sprays each nostril once daily.   -Use nasal saline rinses as needed -Continue supplemental 2 lpm at night and with activity as needed for goal SpO2 >88-90% -Keep your albuterol available use 2 puffs up to every 4 hours if needed for shortness of breath. -Continue the over the counter sinus medicine you have been taking - Continue Singulair 10 mg At bedtime for allergies   Referred to lung cancer screening program    Follow up in 6 months with Dr. Lamonte Sakai. If symptoms do not improve or worsen, please contact office for sooner follow up or seek emergency care.

## 2022-06-27 NOTE — Assessment & Plan Note (Signed)
Former heavy smoker. Quit 2019. Last CT chest in 2021. Referred to lung cancer screening program today.

## 2022-06-27 NOTE — Assessment & Plan Note (Signed)
Improved with addition of singulair. Continue flonase and OTC antihistamine.

## 2022-07-14 ENCOUNTER — Other Ambulatory Visit: Payer: Self-pay | Admitting: Physical Medicine & Rehabilitation

## 2022-07-20 ENCOUNTER — Telehealth: Payer: Self-pay | Admitting: Nurse Practitioner

## 2022-07-20 NOTE — Telephone Encounter (Signed)
Called and spoke with pt and have scheduled him an appt with [facility] tomorrow at 11:30. Nothing further needed.

## 2022-07-20 NOTE — Telephone Encounter (Signed)
Called and spoke with pt who states he has had problems with sinus congestion and also states he has increased SOB. States he is coughing up white to clear phlegm that is sticky in consistency.  Pt is taking OTC sinus meds and using the flonase and allergy meds as prescribed.  Pt said that he does have some wheezing. Pt wants to know what could be recommended to help with his symptoms.  Katie, please advise.

## 2022-07-21 ENCOUNTER — Ambulatory Visit (INDEPENDENT_AMBULATORY_CARE_PROVIDER_SITE_OTHER): Payer: No Typology Code available for payment source | Admitting: Nurse Practitioner

## 2022-07-21 ENCOUNTER — Ambulatory Visit (INDEPENDENT_AMBULATORY_CARE_PROVIDER_SITE_OTHER): Payer: No Typology Code available for payment source

## 2022-07-21 ENCOUNTER — Encounter: Payer: Self-pay | Admitting: Nurse Practitioner

## 2022-07-21 VITALS — BP 112/64 | HR 77 | Ht 72.0 in | Wt 217.8 lb

## 2022-07-21 DIAGNOSIS — J019 Acute sinusitis, unspecified: Secondary | ICD-10-CM | POA: Diagnosis not present

## 2022-07-21 DIAGNOSIS — J440 Chronic obstructive pulmonary disease with acute lower respiratory infection: Secondary | ICD-10-CM

## 2022-07-21 DIAGNOSIS — R059 Cough, unspecified: Secondary | ICD-10-CM | POA: Diagnosis not present

## 2022-07-21 DIAGNOSIS — H6120 Impacted cerumen, unspecified ear: Secondary | ICD-10-CM | POA: Insufficient documentation

## 2022-07-21 DIAGNOSIS — H6121 Impacted cerumen, right ear: Secondary | ICD-10-CM

## 2022-07-21 DIAGNOSIS — J449 Chronic obstructive pulmonary disease, unspecified: Secondary | ICD-10-CM | POA: Diagnosis not present

## 2022-07-21 MED ORDER — PREDNISONE 10 MG PO TABS: ORAL_TABLET | ORAL | 0 refills | Status: DC

## 2022-07-21 MED ORDER — DOXYCYCLINE HYCLATE 100 MG PO TABS: 100.0000 mg | ORAL_TABLET | Freq: Two times a day (BID) | ORAL | 0 refills | Status: DC

## 2022-07-21 NOTE — Assessment & Plan Note (Signed)
Cerumen impaction of left ear. Unable to perform removal in office. Advised he use OTC ear wax removal drops; he has Debrox at home. If no success, recommended he follow up with his PCP.

## 2022-07-21 NOTE — Assessment & Plan Note (Signed)
Likely viral. COVID negative today. Supportive care advised. See above plan.

## 2022-07-21 NOTE — Assessment & Plan Note (Addendum)
Moderately severe COPD with acute respiratory infection. COVID negative today. Non-toxic and VS stable. We will treat him with prednisone taper and empiric doxycycline course. CXR today without superimposed infection. Advised on mucociliary clearance therapies. Continue triple therapy and PRN albuterol. Continue singulair for trigger prevention.   Patient Instructions  -Continue your Trelegy 1 inhalation once daily.  Rinse and gargle after using. -Continue to use your DuoNeb up to every 6 hours as you need it for shortness of breath, chest tightness, wheezing. -Continue fluticasone nasal spray 2 sprays each nostril once daily.   -Use nasal saline rinses as needed -Continue supplemental 2 lpm at night and with activity as needed for goal SpO2 >88-90% -Keep your albuterol available use 2 puffs up to every 4 hours if needed for shortness of breath. - Continue Singulair 10 mg At bedtime for allergies    Mucinex 1200 mg Twice daily for congestion Flutter valve 2-3 times a day  Doxycycline 1 tab Twice daily for 7 days. Take with food. Wear sunscreen while taking. Prednisone taper. 4 tabs for 2 days, then 3 tabs for 2 days, 2 tabs for 2 days, then 1 tab for 2 days, then stop. Take in AM with food Debrox drops over the counter to remove ear wax in your right ear; if no improvement, please call your PCP for removal   Follow up in 1 month with Dr. Lamonte Sakai. If symptoms do not improve or worsen, please contact office for sooner follow up or seek emergency care.

## 2022-07-21 NOTE — Progress Notes (Signed)
$'@Patient'X$  ID: Sean Saunders, male    DOB: 1946-02-05, 76 y.o.   MRN: [id number]  Chief Complaint  Patient presents with   Follow-up    Referring provider: [place], Betty G, MD  HPI: 76 year old male, former smoker (25 pack years) followed for COPD, chronic respiratory failure and allergic rhinitis. He is a patient of Dr. Agustina Caroli and last seen in office on 06/27/2022 by [facility] NP. Past medical history significant for PVD, HTN, previous DVT, DM 2, CKD stage III.   TEST/EVENTS:  05/22/2018 PFTs: FVC 3.02 (64), FEV1 1.8 (52), ratio 59, TLC 91%, DLCOunc 51%.  Moderate obstructive airway disease with diffusion defect without bronchodilator response. 06/27/2020 CT chest without contrast: Minimal pericardial effusion noted.  Mild bilateral bullous changes and centrilobular distribution.  There is no acute process noted 02/24/2022 CXR 2 view: Both lungs were clear.  There was no acute process noted.  11/26/2021: OV with Dr. Lamonte Sakai.  Symptoms mostly stable.  Continued on triple therapy with Trelegy.  Allergic rhinitis well-controlled on Zyrtec and Flonase.  Advised to use saline nasal rinses as needed.  Patient was previously ordered noninvasive ventilation at night however was rejected by insurance and DME despite documentation that he would benefit.  Advised to continue using oxygen at night and during the day at 2 to 3 L depending on level of exertion.  02/24/2022: OV with Derian Dimalanta NP. Acute symptoms with cough and increased nasal drainage. Mild increased in DOE. Improvement in symptoms with OTC sinus medicine. Tx with pred taper and doxy course. Continued Zyrtec and flonase for trigger prevention and postnasal drainage control. Advised to continue supplemental O2 2 lpm with activity and at night - no O2 requirement at visit.  03/28/2022: OV with Monda Chastain NP for follow up after being treated for mild AECOPD. Still having some occasional nasal congestion/cough but overall, improved. Feels like the over the counter  sinus meds he's taking resolved his symptoms. Enjoys being outside but feels like the pollen triggers his symptoms. He denies any increased SOB, wheezing, fevers. Continues on Trelegy daily. Rare use of rescue. He continues on flonase nasal spray and over the counter allergy medicine. Started him on singulair qhs. Oxygen has been stable at home without need for O2 with activity; wears nightly. Awaiting appt with ortho to discuss back pain/possible compression fracture in t spine.   06/27/2022: OV with Darthula Desa NP for follow up. He reports that he has been about the same since we saw him last. He does feel like the smoke and muggy heat have caused him to feel a little more winded when he is out running errands, but otherwise, he has been doing okay. He does feel like the singulair has helped his sinus symptoms. He has an occasional cough, which is unchanged from his baseline and rarely productive. He denies any fevers, night sweats, anorexia, weight loss, hemoptysis. He continues on Trelegy. Rarely uses his rescue. HE uses flonase and OTC antihistamine daily. He hasn't needed his oxygen at home during the day; wears it nightly. Pain is better in his back. He recently paid off his mortgage, which he is excited about.   07/21/2022: Today - acute Patient presents today for acute visit. He contacted the office yesterday reporting increased shortness of breath, productive cough, and worsening sinus congestion. He was also noticing some more wheezing. Today, he reports feeling the same. Symptoms began about 5-6 days ago. Still having increased shortness of breath from baseline. Cough is productive with white, sticky sputum.  He's also having some muffled hearing in his right ear and sinus pressure. He has not had any fevers, night sweats, hemoptysis, leg swelling, orthopnea. No known sick exposures. He continues on Trelegy daily. Uses his neb or rescue daily recently. He is also using flonase nasal spray and taking singulair  at night.   Allergies  Allergen Reactions   Eszopiclone Itching   Niaspan [Niacin Er] Nausea And Vomiting and Rash   Other Hives and Nausea And Vomiting    Ozempic and     Immunization History  Administered Date(s) Administered   Fluad Quad(high Dose 65+) 08/13/2019, 08/31/2020, 09/01/2021   Influenza Split 09/11/2014, 08/15/2016   Influenza, High Dose Seasonal PF 08/30/2011, 09/11/2013, 09/11/2017, 09/25/2017, 09/17/2018, 10/11/2019   Influenza,trivalent, recombinat, inj, PF 09/01/2016   Influenza-Unspecified 08/30/2011, 10/25/2012, 11/11/2013, 08/12/2020, 09/11/2021   PFIZER Comirnaty(Gray Top)Covid-19 Tri-Sucrose Vaccine 01/20/2021   PFIZER(Purple Top)SARS-COV-2 Vaccination 02/13/2020, 03/11/2020   Pneumococcal Conjugate-13 02/27/2018   Pneumococcal Polysaccharide-23 11/08/2019   Pneumococcal-Unspecified 12/12/2009   Tdap 10/25/2012, 12/11/2018    Past Medical History:  Diagnosis Date   Arthritis    COPD (chronic obstructive pulmonary disease) ([place])    Diabetes mellitus    DVT (deep venous thrombosis) ([facility])    GERD (gastroesophageal reflux disease)    Hyperlipidemia    Hypertension    Hypothyroidism    Onychomycosis 06/07/2013   Stroke ([place])    TIA's 01/05/11-01/08/11-01/26/11   TIA (transient ischemic attack) 08/03/2011   Left sided weakness. 3 episodes Feb-March    Tobacco History: Social History   Tobacco Use  Smoking Status Former   Packs/day: 1.00   Years: 25.00   Total pack years: 25.00   Types: Cigarettes   Quit date: 06/05/2018   Years since quitting: 4.1  Smokeless Tobacco Never   Counseling given: Not Answered   Outpatient Medications Prior to Visit  Medication Sig Dispense Refill   albuterol (VENTOLIN HFA) 108 (90 Base) MCG/ACT inhaler Inhale 2 puffs into the lungs every 6 (six) hours as needed for wheezing or shortness of breath.     allopurinol (ZYLOPRIM) 100 MG tablet Take 100 mg by mouth 2 (two) times daily.     allopurinol (ZYLOPRIM) 100 MG  tablet Take 2 tablets by mouth daily.     amLODipine (NORVASC) 10 MG tablet Take 1 tablet by mouth daily.     cetirizine (ZYRTEC) 10 MG tablet TAKE 1 TABLET(10 MG) BY MOUTH DAILY 30 tablet 5   clopidogrel (PLAVIX) 75 MG tablet Take 75 mg by mouth daily.     DULoxetine (CYMBALTA) 30 MG capsule TAKE 1 CAPSULE(30 MG) BY MOUTH DAILY 30 capsule 2   empagliflozin (JARDIANCE) 25 MG TABS tablet Take 1 tablet by mouth daily.     fluticasone (FLONASE) 50 MCG/ACT nasal spray Place 2 sprays into both nostrils daily. 16 g 5   Fluticasone-Umeclidin-Vilant (TRELEGY ELLIPTA) 100-62.5-25 MCG/INH AEPB Inhale 1 puff into the lungs daily at 6 (six) AM. 60 each 11   furosemide (LASIX) 20 MG tablet Take 20 mg by mouth daily.     hydrochlorothiazide (HYDRODIURIL) 25 MG tablet Take 25 mg by mouth 2 (two) times daily.     insulin glargine (LANTUS) 100 UNIT/ML injection Inject 60 Units into the skin 2 (two) times daily. 30 units at night     Ipratropium-Albuterol (COMBIVENT RESPIMAT) 20-100 MCG/ACT AERS respimat Inhale 1 puff into the lungs 2 (two) times daily as needed for wheezing or shortness of breath.     ipratropium-albuterol (DUONEB) 0.5-2.5 (3) MG/3ML SOLN  Take 3 mLs by nebulization every 6 (six) hours as needed (sob & wheezing).     latanoprost (XALATAN) 0.005 % ophthalmic solution INSTILL 1 DROP IN BOTH EYES AT BEDTIME     levothyroxine (SYNTHROID, LEVOTHROID) 150 MCG tablet Take 150 mcg by mouth daily before breakfast.     metFORMIN (GLUCOPHAGE) 500 MG tablet Take 500 mg by mouth 2 (two) times daily with a meal.     montelukast (SINGULAIR) 10 MG tablet Take 1 tablet (10 mg total) by mouth at bedtime. 30 tablet 5   rOPINIRole (REQUIP) 0.25 MG tablet Take 0.25 mg by mouth at bedtime.     rosuvastatin (CRESTOR) 40 MG tablet Take 40 mg by mouth daily.     tamsulosin (FLOMAX) 0.4 MG CAPS capsule Take 0.4 mg by mouth.     vitamin E 180 MG (400 UNITS) capsule Take 400 Units by mouth daily.     amLODipine (NORVASC) 10  MG tablet Take 1 tablet (10 mg total) by mouth daily. 30 tablet 0   carvedilol (COREG) 25 MG tablet Take 1 tablet (25 mg total) by mouth 2 (two) times daily with a meal. 60 tablet 0   cholecalciferol (VITAMIN D3) 25 MCG (1000 UNIT) tablet Take 1,000 Units by mouth daily. (Patient not taking: Reported on 03/31/2022)     finasteride (PROSCAR) 5 MG tablet TAKE ONE TABLET BY MOUTH DAILY FOR ENLARGED PROSTATE. STOP IF ALLERGIC SYMPTOMS AND OR BREAST ENLARGEMENT OR TENDERNESS     Fluticasone-Umeclidin-Vilant (TRELEGY ELLIPTA) 100-62.5-25 MCG/INH AEPB Inhale 1 puff into the lungs daily. (Patient not taking: Reported on 03/31/2022) 14 each 0   insulin regular human CONCENTRATED (HUMULIN R) 500 UNIT/ML kwikpen Inject 40-70 Units into the skin 3 (three) times daily as needed (high blood sugar).     Multiple Vitamin (MULTIVITAMIN WITH MINERALS) TABS tablet Take 1 tablet by mouth daily.     vitamin C (ASCORBIC ACID) 500 MG tablet Take 500 mg by mouth daily.     No facility-administered medications prior to visit.     Review of Systems:   Constitutional: No weight loss or gain, night sweats, fevers, chills, fatigue, or lassitude. HEENT: No headaches, difficulty swallowing, tooth/dental problems, or sore throat. No itching, ear ache +nasal congestion, post nasal drip, sinus pressure, muffled hearing right ear CV:  No chest pain, orthopnea, PND, swelling in lower extremities, anasarca, dizziness, palpitations, syncope Resp: +shortness of breath with exertion; productive cough; wheezing. No excess mucus or change in color of mucus.  No hemoptysis. No chest wall deformity GI:  No heartburn, indigestion, abdominal pain, nausea, vomiting, diarrhea, change in bowel habits, loss of appetite, bloody stools.  Skin: No rash, lesions, ulcerations MSK:  No joint pain or swelling.  No decreased range of motion.  + back pain chronic Neuro: No dizziness or lightheadedness.  Psych: No depression or anxiety. Mood stable.      Physical Exam:  BP 112/64 (BP Location: Right Arm, Cuff Size: Normal)   Pulse 77   Ht 6' (1.829 m)   Wt 217 lb 12.8 oz (98.8 kg)   SpO2 98%   BMI 29.54 kg/m   GEN: Pleasant, interactive, well-appearing; in no acute distress HEENT:  Normocephalic and atraumatic. EACs patent left; cerumen impaction right. TM pearly gray with present light reflex bilaterally. PERRLA. Sclera white. Nasal turbinates erythematous, moist and patent bilaterally. Clear rhinorrhea present. Oropharynx pink and moist, without exudate or edema. No lesions, ulcerations, or postnasal drip.  NECK:  Supple w/ fair ROM. No JVD  present.  CV: RRR, no m/r/g, no peripheral edema. Pulses intact, +2 bilaterally. No cyanosis, pallor or clubbing. PULMONARY:  Unlabored, regular breathing. Scattered rhonchi bilaterally. No accessory muscle use. No dullness to percussion. GI: BS present and normoactive. Soft, non-tender to palpation. No organomegaly or masses detected. No CVA tenderness. MSK: No erythema, warmth or tenderness.  Neuro: A/Ox3. No focal deficits noted.   Skin: Warm, no lesions or rashe Psych: Normal affect and behavior. Judgement and thought content appropriate.     Lab Results:  CBC    Component Value Date/Time   WBC 13.4 (H) 05/01/2021 0803   RBC 4.16 (L) 05/01/2021 0803   HGB 11.9 (L) 05/01/2021 0803   HCT 37.9 (L) 05/01/2021 0803   PLT 225 05/01/2021 0803   MCV 91.1 05/01/2021 0803   MCH 28.6 05/01/2021 0803   MCHC 31.4 05/01/2021 0803   RDW 15.0 05/01/2021 0803   LYMPHSABS 5.1 (H) 04/30/2021 1343   MONOABS 0.8 04/30/2021 1343   EOSABS 0.3 04/30/2021 1343   BASOSABS 0.1 04/30/2021 1343    BMET    Component Value Date/Time   NA 136 05/02/2021 0645   K 5.0 05/02/2021 0645   CL 107 05/02/2021 0645   CO2 24 05/02/2021 0645   GLUCOSE 208 (H) 05/02/2021 0645   BUN 59 (H) 05/02/2021 0645   CREATININE 1.70 (H) 05/02/2021 0645   CREATININE 1.26 07/31/2013 1018   CALCIUM 9.8 05/02/2021 0645    GFRNONAA 42 (L) 05/02/2021 0645   GFRAA >60 07/01/2020 0433    BNP    Component Value Date/Time   BNP 98.3 06/27/2020 0509     Imaging:  DG Chest 2 View  Result Date: 07/21/2022 CLINICAL DATA:  Chest congestion.  Productive cough.  COPD. EXAM: CHEST - 2 VIEW COMPARISON:  Chest two views 02/24/2022 FINDINGS: Cardiac silhouette and mediastinal contours are within normal limits. Mild calcification within aortic arch. The lungs are clear. No pleural effusion or pneumothorax. Mild multilevel degenerative disc changes of the thoracic spine. IMPRESSION: No active cardiopulmonary disease. Electronically Signed   By: Yvonne Kendall M.D.   On: 07/21/2022 12:16         Latest Ref Rng & Units 05/22/2018   10:09 AM  PFT Results  FVC-Pre L 2.89   FVC-Predicted Pre % 61   FVC-Post L 3.02   FVC-Predicted Post % 64   Pre FEV1/FVC % % 59   Post FEV1/FCV % % 59   FEV1-Pre L 1.70   FEV1-Predicted Pre % 49   FEV1-Post L 1.80   DLCO uncorrected ml/min/mmHg 18.21   DLCO UNC% % 51   DLVA Predicted % 66   TLC L 6.79   TLC % Predicted % 91   RV % Predicted % 137     No results found for: "NITRICOXIDE"      Assessment & Plan:   COPD with acute lower respiratory infection ([place]) Moderately severe COPD with acute respiratory infection. COVID negative today. Non-toxic and VS stable. We will treat him with prednisone taper and empiric doxycycline course. CXR today without superimposed infection. Advised on mucociliary clearance therapies. Continue triple therapy and PRN albuterol. Continue singulair for trigger prevention.   Patient Instructions  -Continue your Trelegy 1 inhalation once daily.  Rinse and gargle after using. -Continue to use your DuoNeb up to every 6 hours as you need it for shortness of breath, chest tightness, wheezing. -Continue fluticasone nasal spray 2 sprays each nostril once daily.   -Use nasal saline rinses as  needed -Continue supplemental 2 lpm at night and with  activity as needed for goal SpO2 >88-90% -Keep your albuterol available use 2 puffs up to every 4 hours if needed for shortness of breath. - Continue Singulair 10 mg At bedtime for allergies    Mucinex 1200 mg Twice daily for congestion Flutter valve 2-3 times a day  Doxycycline 1 tab Twice daily for 7 days. Take with food. Wear sunscreen while taking. Prednisone taper. 4 tabs for 2 days, then 3 tabs for 2 days, 2 tabs for 2 days, then 1 tab for 2 days, then stop. Take in AM with food Debrox drops over the counter to remove ear wax in your right ear; if no improvement, please call your PCP for removal   Follow up in 1 month with Dr. Lamonte Sakai. If symptoms do not improve or worsen, please contact office for sooner follow up or seek emergency care.    Acute sinusitis Likely viral. COVID negative today. Supportive care advised. See above plan.   Cerumen impaction Cerumen impaction of left ear. Unable to perform removal in office. Advised he use OTC ear wax removal drops; he has Debrox at home. If no success, recommended he follow up with his PCP.      I spent 35 minutes of dedicated to the care of this patient on the date of this encounter to include pre-visit review of records, face-to-face time with the patient discussing conditions above, post visit ordering of testing, clinical documentation with the electronic health record, making appropriate referrals as documented, and communicating necessary findings to members of the patients care team.  Clayton Bibles, NP 07/21/2022  Pt aware and understands NP's role.

## 2022-07-21 NOTE — Patient Instructions (Addendum)
-  Continue your Trelegy 1 inhalation once daily.  Rinse and gargle after using. -Continue to use your DuoNeb up to every 6 hours as you need it for shortness of breath, chest tightness, wheezing. -Continue fluticasone nasal spray 2 sprays each nostril once daily.   -Use nasal saline rinses as needed -Continue supplemental 2 lpm at night and with activity as needed for goal SpO2 >88-90% -Keep your albuterol available use 2 puffs up to every 4 hours if needed for shortness of breath. - Continue Singulair 10 mg At bedtime for allergies    Mucinex 1200 mg Twice daily for congestion Flutter valve 2-3 times a day  Doxycycline 1 tab Twice daily for 7 days. Take with food. Wear sunscreen while taking. Prednisone taper. 4 tabs for 2 days, then 3 tabs for 2 days, 2 tabs for 2 days, then 1 tab for 2 days, then stop. Take in AM with food Debrox drops over the counter to remove ear wax in your right ear; if no improvement, please call your PCP for removal   Follow up in 1 month with Dr. Lamonte Sakai. If symptoms do not improve or worsen, please contact office for sooner follow up or seek emergency care.

## 2022-08-01 ENCOUNTER — Other Ambulatory Visit: Payer: Self-pay | Admitting: *Deleted

## 2022-08-01 DIAGNOSIS — Z87891 Personal history of nicotine dependence: Secondary | ICD-10-CM

## 2022-08-01 DIAGNOSIS — Z122 Encounter for screening for malignant neoplasm of respiratory organs: Secondary | ICD-10-CM

## 2022-08-24 ENCOUNTER — Encounter: Payer: Self-pay | Admitting: Acute Care

## 2022-08-24 ENCOUNTER — Ambulatory Visit (INDEPENDENT_AMBULATORY_CARE_PROVIDER_SITE_OTHER): Payer: Medicare PPO | Admitting: Acute Care

## 2022-08-24 DIAGNOSIS — Z87891 Personal history of nicotine dependence: Secondary | ICD-10-CM

## 2022-08-24 NOTE — Patient Instructions (Signed)
Thank you for participating in the [facility]. It was our pleasure to meet you today. We will call you with the results of your scan within the next few days. Your scan will be assigned a Lung RADS category score by the physicians reading the scans.  This Lung RADS score determines follow up scanning.  See below for description of categories, and follow up screening recommendations. We will be in touch to schedule your follow up screening annually or based on recommendations of our providers. We will fax a copy of your scan results to your Primary Care Physician, or the physician who referred you to the program, to ensure they have the results. Please call the office if you have any questions or concerns regarding your scanning experience or results.  Our office number is [phone]. Please speak with Denise Phelps, RN. , or  Denise Buckner RN, They are  our Lung Cancer Screening RN.'s If They are unavailable when you call, Please leave a message on the voice mail. We will return your call at our earliest convenience.This voice mail is monitored several times a day.  Remember, if your scan is normal, we will scan you annually as long as you continue to meet the criteria for the program. (Age 55-77, Current smoker or smoker who has quit within the last 15 years). If you are a smoker, remember, quitting is the single most powerful action that you can take to decrease your risk of lung cancer and other pulmonary, breathing related problems. We know quitting is hard, and we are here to help.  Please let us know if there is anything we can do to help you meet your goal of quitting. If you are a former smoker, congratulations. We are proud of you! Remain smoke free! Remember you can refer friends or family members through the number above.  We will screen them to make sure they meet criteria for the program. Thank you for helping us take better care of you by  participating in Lung Screening.  You can receive free nicotine replacement therapy ( patches, gum or mints) by calling 1-800-QUIT NOW. Please call so we can get you on the path to becoming  a non-smoker. I know it is hard, but you can do this!  Lung RADS Categories:  Lung RADS 1: no nodules or definitely non-concerning nodules.  Recommendation is for a repeat annual scan in 12 months.  Lung RADS 2:  nodules that are non-concerning in appearance and behavior with a very low likelihood of becoming an active cancer. Recommendation is for a repeat annual scan in 12 months.  Lung RADS 3: nodules that are probably non-concerning , includes nodules with a low likelihood of becoming an active cancer.  Recommendation is for a 6-month repeat screening scan. Often noted after an upper respiratory illness. We will be in touch to make sure you have no questions, and to schedule your 6-month scan.  Lung RADS 4 A: nodules with concerning findings, recommendation is most often for a follow up scan in 3 months or additional testing based on our provider's assessment of the scan. We will be in touch to make sure you have no questions and to schedule the recommended 3 month follow up scan.  Lung RADS 4 B:  indicates findings that are concerning. We will be in touch with you to schedule additional diagnostic testing based on our provider's  assessment of the scan.  Other options for assistance in smoking cessation (   As covered by your insurance benefits)  Hypnosis for smoking cessation  [employer] [phone]  Acupuncture for smoking cessation  [facility] [phone]   

## 2022-08-24 NOTE — Progress Notes (Signed)
Virtual Visit via Telephone Note  I connected with Sean Saunders on 08/24/22 at 10:00 AM EDT by telephone and verified that I am speaking with the correct person using two identifiers.  Location: Patient: at home Provider:  [place], [place], [place], [address]    I discussed the limitations, risks, security and privacy concerns of performing an evaluation and management service by telephone and the availability of in person appointments. I also discussed with the patient that there may be a patient responsible charge related to this service. The patient expressed understanding and agreed to proceed.    Shared Decision Making Visit Lung Cancer Screening Program [phone])   Eligibility: Age 76 y.o. Pack Years Smoking History Calculation 38 pack year smoking history (# packs/per year x # years smoked) Recent History of coughing up blood  no Unexplained weight loss? no ( >Than 15 pounds within the last 6 months ) Prior History Lung / other cancer no (Diagnosis within the last 5 years already requiring surveillance chest CT Scans). Smoking Status Former Smoker Former Smokers: Years since quit: 4 years  Quit Date: 09/09/2018  Visit Components: Discussion included one or more decision making aids. yes Discussion included risk/benefits of screening. yes Discussion included potential follow up diagnostic testing for abnormal scans. yes Discussion included meaning and risk of over diagnosis. yes Discussion included meaning and risk of False Positives. yes Discussion included meaning of total radiation exposure. yes  Counseling Included: Importance of adherence to annual lung cancer LDCT screening. yes Impact of comorbidities on ability to participate in the program. yes Ability and willingness to under diagnostic treatment. yes  Smoking Cessation Counseling: Current Smokers:  Discussed importance of smoking cessation. yes Information about tobacco cessation classes and  interventions provided to patient. yes Patient provided with "ticket" for LDCT Scan. yes Symptomatic Patient. no  Counseling NA Diagnosis Code: Tobacco Use Z72.0 Asymptomatic Patient yes  Counseling (Intermediate counseling: > three minutes counseling) Q4696 Former Smokers:  Discussed the importance of maintaining cigarette abstinence. yes Diagnosis Code: Personal History of Nicotine Dependence. E95.284 Information about tobacco cessation classes and interventions provided to patient. Yes Patient provided with "ticket" for LDCT Scan. yes Written Order for Lung Cancer Screening with LDCT placed in Epic. Yes (CT Chest Lung Cancer Screening Low Dose W/O CM) [id number] Z12.2-Screening of respiratory organs Z87.891-Personal history of nicotine dependence  I spent 25 minutes of face to face time/virtual visit time  with  Sean Saunders discussing the risks and benefits of lung cancer screening. We took the time to pause the power point at intervals to allow for questions to be asked and answered to ensure understanding. We discussed that he had taken the single most powerful action possible to decrease his risk of developing lung cancer when he quit smoking. I counseled him to remain smoke free, and to contact me if he ever had the desire to smoke again so that I can provide resources and tools to help support the effort to remain smoke free. We discussed the time and location of the scan, and that either  Doroteo Glassman RN, Joella Prince, RN or I  or I will call / send a letter with the results within  24-72 hours of receiving them. He has the office contact information in the event he needs to speak with me,  he verbalized understanding of all of the above and had no further questions upon leaving the office.     I explained to the patient that there has  been a high incidence of coronary artery disease noted on these exams. I explained that this is a non-gated exam therefore degree or severity cannot be  determined. This patient is on statin therapy. I have asked the patient to follow-up with their PCP regarding any incidental finding of coronary artery disease and management with diet or medication as they feel is clinically indicated. The patient verbalized understanding of the above and had no further questions.     Magdalen Spatz, NP 08/24/2022

## 2022-08-26 ENCOUNTER — Encounter (HOSPITAL_COMMUNITY): Payer: Self-pay

## 2022-08-26 ENCOUNTER — Ambulatory Visit (HOSPITAL_COMMUNITY)
Admission: RE | Admit: 2022-08-26 | Discharge: 2022-08-26 | Disposition: A | Discharge status: Home / Self Care | Payer: Medicare PPO | Source: Ambulatory Visit | Attending: Acute Care | Admitting: Acute Care

## 2022-08-26 DIAGNOSIS — Z87891 Personal history of nicotine dependence: Secondary | ICD-10-CM | POA: Diagnosis not present

## 2022-08-26 DIAGNOSIS — Z122 Encounter for screening for malignant neoplasm of respiratory organs: Secondary | ICD-10-CM

## 2022-09-05 ENCOUNTER — Other Ambulatory Visit: Payer: Self-pay

## 2022-09-05 DIAGNOSIS — Z122 Encounter for screening for malignant neoplasm of respiratory organs: Secondary | ICD-10-CM

## 2022-09-05 DIAGNOSIS — Z87891 Personal history of nicotine dependence: Secondary | ICD-10-CM

## 2022-09-14 DIAGNOSIS — L84 Corns and callosities: Secondary | ICD-10-CM | POA: Diagnosis not present

## 2022-09-14 DIAGNOSIS — E1151 Type 2 diabetes mellitus with diabetic peripheral angiopathy without gangrene: Secondary | ICD-10-CM | POA: Diagnosis not present

## 2022-09-14 DIAGNOSIS — I739 Peripheral vascular disease, unspecified: Secondary | ICD-10-CM | POA: Diagnosis not present

## 2022-09-14 DIAGNOSIS — L603 Nail dystrophy: Secondary | ICD-10-CM | POA: Diagnosis not present

## 2022-09-19 ENCOUNTER — Other Ambulatory Visit: Payer: Self-pay | Admitting: Nurse Practitioner

## 2022-09-19 DIAGNOSIS — J309 Allergic rhinitis, unspecified: Secondary | ICD-10-CM

## 2022-10-12 DIAGNOSIS — D0462 Carcinoma in situ of skin of left upper limb, including shoulder: Secondary | ICD-10-CM | POA: Diagnosis not present

## 2022-10-12 DIAGNOSIS — L821 Other seborrheic keratosis: Secondary | ICD-10-CM | POA: Diagnosis not present

## 2022-10-12 DIAGNOSIS — D225 Melanocytic nevi of trunk: Secondary | ICD-10-CM | POA: Diagnosis not present

## 2022-10-12 DIAGNOSIS — L570 Actinic keratosis: Secondary | ICD-10-CM | POA: Diagnosis not present

## 2022-10-12 DIAGNOSIS — Z85828 Personal history of other malignant neoplasm of skin: Secondary | ICD-10-CM | POA: Diagnosis not present

## 2022-10-12 DIAGNOSIS — L814 Other melanin hyperpigmentation: Secondary | ICD-10-CM | POA: Diagnosis not present

## 2022-10-12 DIAGNOSIS — C44719 Basal cell carcinoma of skin of left lower limb, including hip: Secondary | ICD-10-CM | POA: Diagnosis not present

## 2022-10-19 ENCOUNTER — Encounter: Payer: Self-pay | Admitting: Gastroenterology

## 2022-10-26 ENCOUNTER — Other Ambulatory Visit: Payer: Self-pay | Admitting: Physical Medicine & Rehabilitation

## 2022-11-06 DIAGNOSIS — T24212A Burn of second degree of left thigh, initial encounter: Secondary | ICD-10-CM | POA: Diagnosis not present

## 2022-11-06 DIAGNOSIS — T24112A Burn of first degree of left thigh, initial encounter: Secondary | ICD-10-CM | POA: Diagnosis not present

## 2022-11-15 ENCOUNTER — Telehealth: Payer: Self-pay

## 2022-11-15 NOTE — Telephone Encounter (Signed)
Left message for patient to call back and schedule Medicare Annual Wellness Visit (AWV) either virtually or phone . Left  my Herbie Drape number [phone]   Last AWV 08/25/20 please schedule with Jarrett Soho kim   45 min for awv-i and in office appointments 30 min for awv-s  phone/virtual appointments

## 2022-12-06 ENCOUNTER — Telehealth: Payer: Self-pay | Admitting: Nurse Practitioner

## 2022-12-06 MED ORDER — AZITHROMYCIN 250 MG PO TABS: ORAL_TABLET | ORAL | 0 refills | Status: DC

## 2022-12-06 MED ORDER — PREDNISONE 20 MG PO TABS: 20.0000 mg | ORAL_TABLET | Freq: Every day | ORAL | 0 refills | Status: DC

## 2022-12-06 NOTE — Telephone Encounter (Signed)
Called the pt and there was no answer- LMTCB    

## 2022-12-06 NOTE — Telephone Encounter (Signed)
Called and spoke with pt letting him know recs per TP and he verbalized understanding. Meds have been sent to preferred pharmacy. Nothing further needed.

## 2022-12-06 NOTE — Telephone Encounter (Signed)
Recommend COVID home test.  Call back if positive.  Last seen in the office July 21, 2022.  Has a history of COPD.  Recommend  Zpack #1 , TAD ,   Take with food Prednisone 20 mg daily for 5 days. Continue on current maintenance regimen for COPD  Keep follow-up with Dr. Lamonte Sakai or sooner if needed  Please contact office for sooner follow up if symptoms do not improve or worsen or seek emergency care

## 2022-12-06 NOTE — Telephone Encounter (Signed)
I called and spoke with the pt  He is c/o hacking cough- occ white sputum, PND, increased SOB and wheezing with exertion since 12/02/22  He states that he feels okay at rest, but gets SOB with walking short distances and starts wheezing  He denies having aches, fevers  He requests pred and abx  Has not taken covid test recently  He is on albuterol, combivent, zyrtec, flonase, trelegy  Please advise, thanks!  Allergies  Allergen Reactions   Eszopiclone Itching   Niaspan [Niacin Er] Nausea And Vomiting and Rash   Other Hives and Nausea And Vomiting    Ozempic and

## 2022-12-15 DIAGNOSIS — I739 Peripheral vascular disease, unspecified: Secondary | ICD-10-CM | POA: Diagnosis not present

## 2022-12-15 DIAGNOSIS — E1151 Type 2 diabetes mellitus with diabetic peripheral angiopathy without gangrene: Secondary | ICD-10-CM | POA: Diagnosis not present

## 2022-12-15 DIAGNOSIS — L603 Nail dystrophy: Secondary | ICD-10-CM | POA: Diagnosis not present

## 2022-12-15 DIAGNOSIS — L84 Corns and callosities: Secondary | ICD-10-CM | POA: Diagnosis not present

## 2023-02-09 ENCOUNTER — Ambulatory Visit (INDEPENDENT_AMBULATORY_CARE_PROVIDER_SITE_OTHER): Payer: Medicare PPO | Admitting: Emergency Medicine

## 2023-02-09 ENCOUNTER — Encounter: Payer: Self-pay | Admitting: Emergency Medicine

## 2023-02-09 VITALS — BP 142/68 | HR 63 | Temp 98.0°F | Ht 72.0 in | Wt 215.0 lb

## 2023-02-09 DIAGNOSIS — J9611 Chronic respiratory failure with hypoxia: Secondary | ICD-10-CM | POA: Diagnosis not present

## 2023-02-09 DIAGNOSIS — J440 Chronic obstructive pulmonary disease with acute lower respiratory infection: Secondary | ICD-10-CM

## 2023-02-09 DIAGNOSIS — J309 Allergic rhinitis, unspecified: Secondary | ICD-10-CM

## 2023-02-09 DIAGNOSIS — F17200 Nicotine dependence, unspecified, uncomplicated: Secondary | ICD-10-CM | POA: Diagnosis not present

## 2023-02-09 MED ORDER — AZITHROMYCIN 250 MG PO TABS: 250.0000 mg | ORAL_TABLET | Freq: Every day | ORAL | 2 refills | Status: DC

## 2023-02-09 NOTE — Patient Instructions (Addendum)
Please continue Trelegy 1 inhalation once daily.  Rinse and gargle after you take this medication Keep Combivent available to use 2 puffs up to every 6 hours if needed for shortness of breath, chest tightness, wheezing. You can use your DuoNeb up to every 6 hours if needed for shortness of breath Please start guaifenesin 600 mg (generic Mucinex) once daily. We will start azithromycin 250 mg once daily.  We will stay on this medication indefinitely Continue your Flonase nasal spray as you have been taking it Continue your Zyrtec and Singulair as you have been taking them Work on wearing her oxygen more reliably when you exert yourself Get your lung cancer screening CT scan in September as planned Follow with APP in 3 months Follow with Dr Lamonte Sakai in 6 months or sooner if you have any problems

## 2023-02-09 NOTE — Addendum Note (Signed)
Addended by: Gavin Potters R on: 02/09/2023 10:32 AM   Modules accepted: Orders

## 2023-02-09 NOTE — Assessment & Plan Note (Signed)
Work on wearing her oxygen more reliably when you exert yourself

## 2023-02-09 NOTE — Assessment & Plan Note (Signed)
Continue your Flonase nasal spray as you have been taking it Continue your Zyrtec and Singulair as you have been taking them

## 2023-02-09 NOTE — Assessment & Plan Note (Signed)
Get your lung cancer screening CT scan in September as planned

## 2023-02-09 NOTE — Progress Notes (Signed)
Subjective:    Patient ID: Sean Saunders, male    DOB: 07-Sep-1946, 77 y.o.   MRN: [id number]  HPI  ROV 11/26/21 --follow-up visit 77 year old man with severe structural lung disease and chronic hypoxic respiratory failure.  Also with a history of chronic cough, rhinitis.  He has supplemental oxygen.  I had done an addendum to an office visit 09/01/2021 to document that he would benefit from a trilogy ventilator. It was refused by his DME company.  He is using O2 at 2-3L/min depending on his exertional level. He is on Trelegy, uses duoneb bid. He uses albuterol about . He has some clear throat mucous, feels nasal drainage in the am on cetirizine and fluticasone nasal spray. Coughs several times a day, clear mucous. Able to walk through the store with his O2 and a buggy.  No hospitalizations. Treated w abx and pred in September.  Averages 2-3x a year.   ROV 02/09/23 --77 year old man with a history of former tobacco, severe obstructive lung disease with associated chronic hypoxemic respiratory failure.  He has a history of rhinitis and chronic cough.  I last saw him in 11/2021 with his had several visits to our office in the interim for rhinitis, flaring symptoms.  He averages 2-3 flares with prednisone annually, last was 11/2022.  Currently managed on Trelegy Flonase, Zyrtec, Singulair.  He uses Combivent approximately once a day.  Reports today that he has stable wheeze, cough. He has O2 but does not use it with most exertion because it is cumbersome. He hears rhonchi / gurgling. He was never able to get a trilogy ventilator - the company wouldn't cover it.   PFT 05/22/2018 > FEV1 49% predicted  Screening CT scan 08/26/2022 reviewed by me showed centrilobular emphysema, tiny bilateral scattered nodules largest 4.2 mm.  No suspicious nodules or masses.  RADS 2 study.  Review of Systems  Constitutional:  Negative for fever and unexpected weight change.  HENT:  Negative for congestion, dental  problem, ear pain, nosebleeds, postnasal drip, rhinorrhea, sinus pressure, sneezing, sore throat and trouble swallowing.   Eyes:  Negative for redness and itching.  Respiratory:  Positive for cough, chest tightness, shortness of breath and wheezing.   Cardiovascular:  Negative for palpitations and leg swelling.  Gastrointestinal:  Negative for nausea and vomiting.  Genitourinary:  Negative for dysuria.  Musculoskeletal:  Negative for joint swelling.  Skin:  Negative for rash.  Neurological:  Negative for headaches.  Hematological:  Does not bruise/bleed easily.  Psychiatric/Behavioral:  Negative for dysphoric mood. The patient is not nervous/anxious.        Objective:   Physical Exam Vitals:   02/09/23 1007  BP: (!) 142/68  Pulse: 63  Temp: 98 F (36.7 C)  TempSrc: Oral  SpO2: 99%  Weight: 215 lb (97.5 kg)  Height: 6' (1.829 m)   Gen: Pleasant, obese man, in no distress,  normal affect  ENT: No lesions,  UA noise / secretions on expiration.   Neck: No JVD, loud upper airway noise, secretions esp on expiration  Lungs: No use of accessory muscles, distant, distant but no wheezing, just referred upper airway noise  Cardiovascular: RRR, heart sounds normal, no murmur or gallops, no peripheral edema  Musculoskeletal: No deformities, no cyanosis or clubbing  Neuro: alert, non focal  Skin: Warm, no lesions or rash      Assessment & Plan:  COPD with acute lower respiratory infection (HCC) Bronchitic symptoms, high mucus burden.  Flares especially in the  spring and fall months.  I will try adding guaifenesin and scheduled azithromycin.  Continue his Trelegy and his as needed regimen.  Control rhinitis as aggressively as possible.  We tried to get him a trilogy ventilator in the past but his insurance and DME would not provide it.  Please continue Trelegy 1 inhalation once daily.  Rinse and gargle after you take this medication Keep Combivent available to use 2 puffs up to  every 6 hours if needed for shortness of breath, chest tightness, wheezing. You can use your DuoNeb up to every 6 hours if needed for shortness of breath Please start guaifenesin 600 mg (generic Mucinex) once daily. We will start azithromycin 250 mg once daily.  We will stay on this medication indefinitely Follow with APP in 3 months Follow with Dr Lamonte Sakai in 6 months or sooner if you have any problems  Rhinitis, allergic Continue your Flonase nasal spray as you have been taking it Continue your Zyrtec and Singulair as you have been taking them  Chronic respiratory failure with hypoxia ([place]) Work on wearing her oxygen more reliably when you exert yourself  Tobacco use disorder Get your lung cancer screening CT scan in September as planned  Baltazar Apo, MD, PhD 02/09/2023, 10:29 AM [facility] [phone] or if no answer [phone]

## 2023-02-09 NOTE — Assessment & Plan Note (Signed)
Bronchitic symptoms, high mucus burden.  Flares especially in the spring and fall months.  I will try adding guaifenesin and scheduled azithromycin.  Continue his Trelegy and his as needed regimen.  Control rhinitis as aggressively as possible.  We tried to get him a trilogy ventilator in the past but his insurance and DME would not provide it.  Please continue Trelegy 1 inhalation once daily.  Rinse and gargle after you take this medication Keep Combivent available to use 2 puffs up to every 6 hours if needed for shortness of breath, chest tightness, wheezing. You can use your DuoNeb up to every 6 hours if needed for shortness of breath Please start guaifenesin 600 mg (generic Mucinex) once daily. We will start azithromycin 250 mg once daily.  We will stay on this medication indefinitely Follow with APP in 3 months Follow with Dr Lamonte Sakai in 6 months or sooner if you have any problems

## 2023-03-16 DIAGNOSIS — E1151 Type 2 diabetes mellitus with diabetic peripheral angiopathy without gangrene: Secondary | ICD-10-CM | POA: Diagnosis not present

## 2023-03-16 DIAGNOSIS — I739 Peripheral vascular disease, unspecified: Secondary | ICD-10-CM | POA: Diagnosis not present

## 2023-03-16 DIAGNOSIS — L84 Corns and callosities: Secondary | ICD-10-CM | POA: Diagnosis not present

## 2023-03-16 DIAGNOSIS — L603 Nail dystrophy: Secondary | ICD-10-CM | POA: Diagnosis not present

## 2023-03-21 ENCOUNTER — Other Ambulatory Visit: Payer: Self-pay | Admitting: Nurse Practitioner

## 2023-03-21 DIAGNOSIS — J309 Allergic rhinitis, unspecified: Secondary | ICD-10-CM

## 2023-04-11 ENCOUNTER — Other Ambulatory Visit: Payer: Self-pay | Admitting: *Deleted

## 2023-04-11 DIAGNOSIS — I739 Peripheral vascular disease, unspecified: Secondary | ICD-10-CM

## 2023-04-11 DIAGNOSIS — I6523 Occlusion and stenosis of bilateral carotid arteries: Secondary | ICD-10-CM

## 2023-04-13 DIAGNOSIS — L570 Actinic keratosis: Secondary | ICD-10-CM | POA: Diagnosis not present

## 2023-04-13 DIAGNOSIS — Z85828 Personal history of other malignant neoplasm of skin: Secondary | ICD-10-CM | POA: Diagnosis not present

## 2023-04-13 DIAGNOSIS — L820 Inflamed seborrheic keratosis: Secondary | ICD-10-CM | POA: Diagnosis not present

## 2023-04-13 DIAGNOSIS — D225 Melanocytic nevi of trunk: Secondary | ICD-10-CM | POA: Diagnosis not present

## 2023-04-13 DIAGNOSIS — L814 Other melanin hyperpigmentation: Secondary | ICD-10-CM | POA: Diagnosis not present

## 2023-04-13 DIAGNOSIS — D0422 Carcinoma in situ of skin of left ear and external auricular canal: Secondary | ICD-10-CM | POA: Diagnosis not present

## 2023-04-16 DIAGNOSIS — S39012A Strain of muscle, fascia and tendon of lower back, initial encounter: Secondary | ICD-10-CM | POA: Diagnosis not present

## 2023-04-18 ENCOUNTER — Ambulatory Visit (INDEPENDENT_AMBULATORY_CARE_PROVIDER_SITE_OTHER)
Admission: RE | Admit: 2023-04-18 | Discharge: 2023-04-18 | Disposition: A | Discharge status: Home / Self Care | Payer: Medicare PPO | Source: Ambulatory Visit | Attending: Vascular Surgery | Admitting: Vascular Surgery

## 2023-04-18 ENCOUNTER — Ambulatory Visit (HOSPITAL_COMMUNITY)
Admission: RE | Admit: 2023-04-18 | Discharge: 2023-04-18 | Disposition: A | Discharge status: Home / Self Care | Payer: Medicare PPO | Source: Ambulatory Visit | Attending: Vascular Surgery | Admitting: Vascular Surgery

## 2023-04-18 DIAGNOSIS — I739 Peripheral vascular disease, unspecified: Secondary | ICD-10-CM | POA: Insufficient documentation

## 2023-04-18 DIAGNOSIS — I6523 Occlusion and stenosis of bilateral carotid arteries: Secondary | ICD-10-CM | POA: Diagnosis not present

## 2023-04-18 LAB — VAS US ABI WITH/WO TBI
Left ABI: 0.96
Right ABI: 1.07

## 2023-04-24 ENCOUNTER — Other Ambulatory Visit: Payer: Self-pay | Admitting: Emergency Medicine

## 2023-04-27 DIAGNOSIS — D0422 Carcinoma in situ of skin of left ear and external auricular canal: Secondary | ICD-10-CM | POA: Diagnosis not present

## 2023-04-27 DIAGNOSIS — Z85828 Personal history of other malignant neoplasm of skin: Secondary | ICD-10-CM | POA: Diagnosis not present

## 2023-04-28 ENCOUNTER — Telehealth: Payer: Self-pay

## 2023-04-28 NOTE — Telephone Encounter (Signed)
LVM instructions for pt to return call to let office know if he still has a PCP here.

## 2023-05-04 ENCOUNTER — Encounter: Payer: Self-pay | Admitting: Vascular Surgery

## 2023-05-04 ENCOUNTER — Ambulatory Visit (INDEPENDENT_AMBULATORY_CARE_PROVIDER_SITE_OTHER): Payer: Medicare PPO | Admitting: Vascular Surgery

## 2023-05-04 VITALS — BP 143/84 | HR 70 | Temp 98.0°F | Resp 20 | Ht 72.0 in | Wt 218.0 lb

## 2023-05-04 DIAGNOSIS — I6523 Occlusion and stenosis of bilateral carotid arteries: Secondary | ICD-10-CM

## 2023-05-04 DIAGNOSIS — I739 Peripheral vascular disease, unspecified: Secondary | ICD-10-CM

## 2023-05-04 NOTE — Progress Notes (Signed)
REASON FOR VISIT:   Follow-up of mild carotid disease and peripheral arterial disease  MEDICAL ISSUES:   PERIPHERAL ARTERIAL DISEASE: This patient has undergone a previous aortobifemoral bypass graft and subsequently a left femoral to below-knee popliteal artery bypass.  His bypass graft in the left leg is patent and he has normal ABIs.  His graft has been stable for many years now.  He would like to stretch his follow-up out to 2 years which I think is reasonable.  I explained that I will be retiring and he will be seen on the PA schedule at that time.  He knows to call sooner if he has problems.  He is not a smoker.  He quit in 2019.  BILATERAL CAROTID DISEASE: Patient has some mild disease in bilateral common carotid arteries.  He has no significant disease in the internal carotid arteries.  He is asymptomatic.  I have ordered follow-up carotid duplex scan in 2 years.  He is on Plavix and is on a statin.  HPI:   Sean Saunders is a pleasant 77 y.o. male who I last saw on 03/31/2022.  I have been following him with peripheral arterial disease and carotid disease. He underwent aortofemoral bypass grafting in 2006. In 2019 he had a left femoral to below-knee popliteal artery bypass that subsequently required extension to the tibial peroneal trunk.   I am also following him with a left carotid stenosis.  He had some stenosis in the common carotid arteries bilaterally.  He denies any history of stroke, TIAs, expressive or receptive aphasia, or amaurosis fugax.  He denies any history of claudication although his activity I think is limited by his COPD.  He denies any history of rest pain or nonhealing ulcers. He quit smoking in 2019.   Past Medical History:  Diagnosis Date   Arthritis    COPD (chronic obstructive pulmonary disease) (HCC)    Diabetes mellitus    DVT (deep venous thrombosis) (HCC)    GERD (gastroesophageal reflux disease)    Hyperlipidemia    Hypertension     Hypothyroidism    Onychomycosis 06/07/2013   Stroke (HCC)    TIA's 01/05/11-01/08/11-01/26/11   TIA (transient ischemic attack) 08/03/2011   Left sided weakness. 3 episodes Feb-March    Family History  Problem Relation Age of Onset   Stroke Mother    Diabetes Mother    Other Father        alzheimers   Diabetes Father    Stroke Father    Diabetes Sister     SOCIAL HISTORY: Social History   Tobacco Use   Smoking status: Former    Packs/day: 1.00    Years: 25.00    Additional pack years: 0.00    Total pack years: 25.00    Types: Cigarettes    Quit date: 06/05/2018    Years since quitting: 4.9   Smokeless tobacco: Never  Substance Use Topics   Alcohol use: Not Currently    Alcohol/week: 0.0 standard drinks of alcohol    Comment: rare    Allergies  Allergen Reactions   Eszopiclone Itching   Niaspan [Niacin Er] Nausea And Vomiting and Rash   Finasteride    Other Hives and Nausea And Vomiting    Ozempic and     Current Outpatient Medications  Medication Sig Dispense Refill   albuterol (VENTOLIN HFA) 108 (90 Base) MCG/ACT inhaler Inhale 2 puffs into the lungs every 6 (six) hours as needed for wheezing or shortness  of breath.     allopurinol (ZYLOPRIM) 100 MG tablet Take 100 mg by mouth 2 (two) times daily.     azithromycin (ZITHROMAX) 250 MG tablet TAKE 1 TABLET BY MOUTH DAILY 30 tablet 2   cetirizine (ZYRTEC) 10 MG tablet TAKE 1 TABLET(10 MG) BY MOUTH DAILY 30 tablet 5   clopidogrel (PLAVIX) 75 MG tablet Take 75 mg by mouth daily.     DULoxetine (CYMBALTA) 30 MG capsule TAKE 1 CAPSULE(30 MG) BY MOUTH DAILY 30 capsule 2   fluticasone (FLONASE) 50 MCG/ACT nasal spray Place 2 sprays into both nostrils daily. 16 g 5   Fluticasone-Umeclidin-Vilant (TRELEGY ELLIPTA) 100-62.5-25 MCG/INH AEPB Inhale 1 puff into the lungs daily at 6 (six) AM. 60 each 11   furosemide (LASIX) 20 MG tablet Take 20 mg by mouth daily.     hydrochlorothiazide (HYDRODIURIL) 25 MG tablet Take 25 mg by  mouth 2 (two) times daily.     insulin glargine (LANTUS) 100 UNIT/ML injection Inject 60 Units into the skin 2 (two) times daily. 30 units at night     Ipratropium-Albuterol (COMBIVENT RESPIMAT) 20-100 MCG/ACT AERS respimat Inhale 1 puff into the lungs 2 (two) times daily as needed for wheezing or shortness of breath.     ipratropium-albuterol (DUONEB) 0.5-2.5 (3) MG/3ML SOLN Take 3 mLs by nebulization every 6 (six) hours as needed (sob & wheezing).     levothyroxine (SYNTHROID, LEVOTHROID) 150 MCG tablet Take 150 mcg by mouth daily before breakfast.     metFORMIN (GLUCOPHAGE) 500 MG tablet Take 500 mg by mouth 2 (two) times daily with a meal.     montelukast (SINGULAIR) 10 MG tablet TAKE 1 TABLET(10 MG) BY MOUTH AT BEDTIME 30 tablet 5   predniSONE (DELTASONE) 20 MG tablet Take 1 tablet (20 mg total) by mouth daily with breakfast. 5 tablet 0   rOPINIRole (REQUIP) 0.25 MG tablet Take 0.25 mg by mouth at bedtime.     rosuvastatin (CRESTOR) 40 MG tablet Take 40 mg by mouth daily.     tamsulosin (FLOMAX) 0.4 MG CAPS capsule Take 0.4 mg by mouth.     vitamin E 180 MG (400 UNITS) capsule Take 400 Units by mouth daily.     amLODipine (NORVASC) 10 MG tablet Take 1 tablet by mouth daily.     carvedilol (COREG) 25 MG tablet Take 1 tablet (25 mg total) by mouth 2 (two) times daily with a meal. 60 tablet 0   No current facility-administered medications for this visit.    REVIEW OF SYSTEMS:  [X]  denotes positive finding, [ ]  denotes negative finding Cardiac  Comments:  Chest pain or chest pressure:    Shortness of breath upon exertion: x   Short of breath when lying flat:    Irregular heart rhythm:        Vascular    Pain in calf, thigh, or hip brought on by ambulation:    Pain in feet at night that wakes you up from your sleep:     Blood clot in your veins:    Leg swelling:         Pulmonary    Oxygen at home:    Productive cough:     Wheezing:         Neurologic    Sudden weakness in arms  or legs:     Sudden numbness in arms or legs:     Sudden onset of difficulty speaking or slurred speech:    Temporary loss of vision in  one eye:     Problems with dizziness:         Gastrointestinal    Blood in stool:     Vomited blood:         Genitourinary    Burning when urinating:     Blood in urine:        Psychiatric    Major depression:         Hematologic    Bleeding problems:    Problems with blood clotting too easily:        Skin    Rashes or ulcers:        Constitutional    Fever or chills:     PHYSICAL EXAM:   Vitals:   05/04/23 1533  BP: (!) 143/84  Pulse: 70  Resp: 20  Temp: 98 F (36.7 C)  SpO2: 94%  Weight: 218 lb (98.9 kg)  Height: 6' (1.829 m)    GENERAL: The patient is a well-nourished male, in no acute distress. The vital signs are documented above. CARDIAC: There is a regular rate and rhythm.  VASCULAR: I do not detect carotid bruits. He has palpable femoral pulses and posterior tibial pulses bilaterally. PULMONARY: There is good air exchange bilaterally without wheezing or rales. ABDOMEN: Soft and non-tender with normal pitched bowel sounds.  MUSCULOSKELETAL: There are no major deformities or cyanosis. NEUROLOGIC: No focal weakness or paresthesias are detected. SKIN: There are no ulcers or rashes noted. PSYCHIATRIC: The patient has a normal affect.  DATA:    CAROTID DUPLEX: I reviewed the carotid duplex scan from 04/18/2023.  On the right side there is no significant internal carotid artery stenosis.  There is moderate less than 50% stenosis noted in the common carotid artery.  There is a stenosis in the proximal external carotid artery on the right.  The right vertebral artery is patent with antegrade flow.  On the left side there is no significant ICA stenosis.  There is some plaque in the common carotid artery with a less than 50% stenosis.  There is also an external carotid artery stenosis on the left.  The left vertebral artery is  patent with antegrade flow.  ARTERIAL DOPPLER STUDY: I have reviewed the arterial Doppler study that was done on 04/18/2023.  On the right side there was a biphasic dorsalis pedis and posterior tibial signal.  ABI was 100%.  Toe pressure 114 mmHg.  On the left side there was a biphasic dorsalis pedis and posterior tibial signal.  ABI was 96%.  Toe pressure 111 mmHg.  GRAFT DUPLEX: I have reviewed the graft duplex that was done on 04/18/2023.  His left femoropopliteal bypass graft is patent.  There is no area of stenosis within the graft.  There are some mildly elevated velocities at the distal anastomosis.  Peak systolic velocity is 220 cm/s.  Waverly Ferrari [facility] [phone]

## 2023-05-10 ENCOUNTER — Telehealth: Payer: Self-pay | Admitting: Emergency Medicine

## 2023-05-10 ENCOUNTER — Ambulatory Visit (INDEPENDENT_AMBULATORY_CARE_PROVIDER_SITE_OTHER): Payer: Medicare PPO | Admitting: Adult Health

## 2023-05-10 ENCOUNTER — Encounter: Payer: Self-pay | Admitting: Adult Health

## 2023-05-10 VITALS — BP 120/50 | HR 91 | Temp 97.9°F | Ht 72.0 in | Wt 222.0 lb

## 2023-05-10 DIAGNOSIS — J309 Allergic rhinitis, unspecified: Secondary | ICD-10-CM

## 2023-05-10 DIAGNOSIS — J4489 Other specified chronic obstructive pulmonary disease: Secondary | ICD-10-CM

## 2023-05-10 DIAGNOSIS — J9611 Chronic respiratory failure with hypoxia: Secondary | ICD-10-CM

## 2023-05-10 DIAGNOSIS — J439 Emphysema, unspecified: Secondary | ICD-10-CM | POA: Diagnosis not present

## 2023-05-10 DIAGNOSIS — J449 Chronic obstructive pulmonary disease, unspecified: Secondary | ICD-10-CM | POA: Insufficient documentation

## 2023-05-10 NOTE — Progress Notes (Signed)
@Patient  ID: Sean Saunders, male    DOB: 09-27-1946, 77 y.o.   MRN: [id number]  Chief Complaint  Patient presents with   Follow-up    Referring provider: [facility], [facility]  HPI: 77 year old male former smoker followed for severe COPD, allergic rhinitis and chronic respiratory failure Medical history for DM, PVD. Hx of Agent orange exposure   TEST/EVENTS :  PFT 05/22/2018 > FEV1 49% predicted  Trilogy order - Insurance denied    Screening CT scan 08/26/2022 showed centrilobular emphysema, tiny bilateral scattered nodules largest 4.2 mm.  No suspicious nodules or masses.  RADS 2 study.  05/10/2023 Follow up: COPD , AR and O2 RF  Patient presents for a 91-month follow-up.  Patient has underlying severe COPD. He remains on Trelegy inhaler daily.  He was started on azithromycin 250 mg daily last visit. Tolerating well. Ran out 2 weeks ago. Says he was not sure if he was suppose to continue on or not. No n/v/d. Has daily chronic cough with rattling in chest. Uses Combivent Twice daily . Uses Duoneb usually once a week. Says overall is doing okay , is used to having a daily cough. Currently better than usual.   Has chronic allergies on Zyrtec Singulair and Flonase daily. Currently well controlled without flare.   Patient was previously on Oxygen with activity . Does not wear. DME denied POC so he said he stopped wearing it. Walk test in the office today shows no desaturations on room air.   Granddaughter is living with him this summer.   Gets labs at [facility] in [place]. Was in [place] war, exposed to agent orage.      Allergies  Allergen Reactions   Eszopiclone Itching   Niaspan [Niacin Er] Nausea And Vomiting and Rash   Finasteride    Other Hives and Nausea And Vomiting    Ozempic and     Immunization History  Administered Date(s) Administered   Fluad Quad(high Dose 65+) 08/13/2019, 08/31/2020, 09/01/2021   Influenza Split 09/11/2014, 08/15/2016   Influenza, High Dose  Seasonal PF 08/30/2011, 09/11/2013, 09/11/2017, 09/25/2017, 09/17/2018, 10/11/2019   Influenza,trivalent, recombinat, inj, PF 09/01/2016   Influenza-Unspecified 08/30/2011, 10/25/2012, 11/11/2013, 08/12/2020, 09/11/2021   MMR 02/12/2014   PFIZER Comirnaty(Gray Top)Covid-19 Tri-Sucrose Vaccine 01/20/2021   PFIZER(Purple Top)SARS-COV-2 Vaccination 02/13/2020, 03/11/2020, 01/20/2021   Pneumococcal Conjugate-13 02/27/2018   Pneumococcal Polysaccharide-23 11/08/2019   Pneumococcal-Unspecified 12/12/2009   Rsv, Bivalent, Protein Subunit Rsvpref,pf Verdis Frederickson) 11/23/2022   Tdap 10/25/2012, 12/11/2018, 08/22/2022   Zoster Recombinat (Shingrix) 06/11/2022, 09/11/2022    Past Medical History:  Diagnosis Date   Arthritis    COPD (chronic obstructive pulmonary disease) (HCC)    Diabetes mellitus    DVT (deep venous thrombosis) (HCC)    GERD (gastroesophageal reflux disease)    Hyperlipidemia    Hypertension    Hypothyroidism    Onychomycosis 06/07/2013   Stroke (HCC)    TIA's 01/05/11-01/08/11-01/26/11   TIA (transient ischemic attack) 08/03/2011   Left sided weakness. 3 episodes Feb-March    Tobacco History: Social History   Tobacco Use  Smoking Status Former   Packs/day: 1.00   Years: 25.00   Additional pack years: 0.00   Total pack years: 25.00   Types: Cigarettes   Quit date: 06/05/2018   Years since quitting: 4.9  Smokeless Tobacco Never   Counseling given: Not Answered   Outpatient Medications Prior to Visit  Medication Sig Dispense Refill   albuterol (VENTOLIN HFA) 108 (90 Base) MCG/ACT inhaler Inhale 2 puffs into the  lungs every 6 (six) hours as needed for wheezing or shortness of breath.     allopurinol (ZYLOPRIM) 100 MG tablet Take 100 mg by mouth 2 (two) times daily.     cetirizine (ZYRTEC) 10 MG tablet TAKE 1 TABLET(10 MG) BY MOUTH DAILY 30 tablet 5   clopidogrel (PLAVIX) 75 MG tablet Take 75 mg by mouth daily.     DULoxetine (CYMBALTA) 30 MG capsule TAKE 1 CAPSULE(30  MG) BY MOUTH DAILY 30 capsule 2   fluticasone (FLONASE) 50 MCG/ACT nasal spray Place 2 sprays into both nostrils daily. 16 g 5   Fluticasone-Umeclidin-Vilant (TRELEGY ELLIPTA) 100-62.5-25 MCG/INH AEPB Inhale 1 puff into the lungs daily at 6 (six) AM. 60 each 11   furosemide (LASIX) 20 MG tablet Take 20 mg by mouth daily.     hydrochlorothiazide (HYDRODIURIL) 25 MG tablet Take 25 mg by mouth 2 (two) times daily.     insulin glargine (LANTUS) 100 UNIT/ML injection Inject 60 Units into the skin 2 (two) times daily. 30 units at night     Ipratropium-Albuterol (COMBIVENT RESPIMAT) 20-100 MCG/ACT AERS respimat Inhale 1 puff into the lungs 2 (two) times daily as needed for wheezing or shortness of breath.     levothyroxine (SYNTHROID, LEVOTHROID) 150 MCG tablet Take 150 mcg by mouth daily before breakfast.     metFORMIN (GLUCOPHAGE) 500 MG tablet Take 500 mg by mouth 2 (two) times daily with a meal.     montelukast (SINGULAIR) 10 MG tablet TAKE 1 TABLET(10 MG) BY MOUTH AT BEDTIME 30 tablet 5   rOPINIRole (REQUIP) 0.25 MG tablet Take 0.25 mg by mouth at bedtime.     rosuvastatin (CRESTOR) 40 MG tablet Take 40 mg by mouth daily.     tamsulosin (FLOMAX) 0.4 MG CAPS capsule Take 0.4 mg by mouth.     vitamin E 180 MG (400 UNITS) capsule Take 400 Units by mouth daily.     amLODipine (NORVASC) 10 MG tablet Take 1 tablet by mouth daily.     azithromycin (ZITHROMAX) 250 MG tablet TAKE 1 TABLET BY MOUTH DAILY (Patient not taking: Reported on 05/10/2023) 30 tablet 2   carvedilol (COREG) 25 MG tablet Take 1 tablet (25 mg total) by mouth 2 (two) times daily with a meal. 60 tablet 0   ipratropium-albuterol (DUONEB) 0.5-2.5 (3) MG/3ML SOLN Take 3 mLs by nebulization every 6 (six) hours as needed (sob & wheezing). (Patient not taking: Reported on 05/10/2023)     predniSONE (DELTASONE) 20 MG tablet Take 1 tablet (20 mg total) by mouth daily with breakfast. (Patient not taking: Reported on 05/10/2023) 5 tablet 0   No  facility-administered medications prior to visit.     Review of Systems:   Constitutional:   No  weight loss, night sweats,  Fevers, chills, fatigue, or  lassitude.  HEENT:   No headaches,  Difficulty swallowing,  Tooth/dental problems, or  Sore throat,                No sneezing, itching, ear ache, nasal congestion, post nasal drip,   CV:  No chest pain,  Orthopnea, PND, swelling in lower extremities, anasarca, dizziness, palpitations, syncope.   GI  No heartburn, indigestion, abdominal pain, nausea, vomiting, diarrhea, change in bowel habits, loss of appetite, bloody stools.   Resp:  No chest wall deformity  Skin: no rash or lesions.  GU: no dysuria, change in color of urine, no urgency or frequency.  No flank pain, no hematuria   MS:  No  joint pain or swelling.  No decreased range of motion.  No back pain.    Physical Exam  BP (!) 120/50 (BP Location: Left Arm, Patient Position: Sitting, Cuff Size: Normal)   Pulse 91   Temp 97.9 F (36.6 C) (Oral)   Ht 6' (1.829 m)   Wt 222 lb (100.7 kg)   SpO2 97%   BMI 30.11 kg/m   GEN: A/Ox3; pleasant , NAD, well nourished    HEENT:  [place]/AT,  EACs-clear, TMs-wnl, NOSE-clear, THROAT-clear, no lesions, no postnasal drip or exudate noted.   NECK:  Supple w/ fair ROM; no JVD; normal carotid impulses w/o bruits; no thyromegaly or nodules palpated; no lymphadenopathy.    RESP  Coarse Rhonchi bilaterally no accessory muscle use, no dullness to percussion  CARD:  RRR, no m/r/g, no peripheral edema, pulses intact, no cyanosis or clubbing.  GI:   Soft & nt; nml bowel sounds; no organomegaly or masses detected.   Musco: Warm bil, no deformities or joint swelling noted.   Neuro: alert, no focal deficits noted.    Skin: Warm, no lesions or rashes    Lab Results:  CBC   BMET         Latest Ref Rng & Units 05/22/2018   10:09 AM  PFT Results  FVC-Pre L 2.89   FVC-Predicted Pre % 61   FVC-Post L 3.02   FVC-Predicted Post  % 64   Pre FEV1/FVC % % 59   Post FEV1/FCV % % 59   FEV1-Pre L 1.70   FEV1-Predicted Pre % 49   FEV1-Post L 1.80   DLCO uncorrected ml/min/mmHg 18.21   DLCO UNC% % 51   DLVA Predicted % 66   TLC L 6.79   TLC % Predicted % 91   RV % Predicted % 137     No results found for: "NITRICOXIDE"      Assessment & Plan:   COPD (chronic obstructive pulmonary disease) (HCC) COPD-prone to recurrent flare , add flutter valve , restart daily Azithromycin. Try to use Duoneb daily to see if this helps with with cough/thick mucus .   Plan  Patient Instructions  Please continue Trelegy 1 inhalation once daily.  Rinse and gargle after you take this medication Keep Combivent available to use 2 puffs up to every 6 hours if needed for shortness of breath, chest tightness, wheezing. You can use your DuoNeb up to every 6 hours if needed for shortness of breath Add Flutter valve Twice daily   Guaifenesin 600 mg (generic Mucinex) once daily. Restart the azithromycin 250 mg once daily.  We will stay on this medication indefinitely Continue your Flonase nasal spray as you have been taking it Continue your Zyrtec and Singulair as you have been taking them  Get your lung cancer screening CT scan in September as planned Follow with Dr Delton Coombes in 3 months or sooner if you have any problems     Chronic respiratory failure with hypoxia (HCC) Walk test in office today without desaturations . May remain off O2 for now.  Goal is to keep O2 sats >88-90%.   Plan  Patient Instructions  Please continue Trelegy 1 inhalation once daily.  Rinse and gargle after you take this medication Keep Combivent available to use 2 puffs up to every 6 hours if needed for shortness of breath, chest tightness, wheezing. You can use your DuoNeb up to every 6 hours if needed for shortness of breath Add Flutter valve Twice daily   Guaifenesin  600 mg (generic Mucinex) once daily. Restart the azithromycin 250 mg once daily.  We  will stay on this medication indefinitely Continue your Flonase nasal spray as you have been taking it Continue your Zyrtec and Singulair as you have been taking them  Get your lung cancer screening CT scan in September as planned Follow with Dr Delton Coombes in 3 months or sooner if you have any problems     Rhinitis, allergic Continue on current regimen      Rubye Oaks, NP 05/10/2023

## 2023-05-10 NOTE — Assessment & Plan Note (Signed)
Continue on current regimen .   

## 2023-05-10 NOTE — Assessment & Plan Note (Signed)
Walk test in office today without desaturations . May remain off O2 for now.  Goal is to keep O2 sats >88-90%.   Plan  Patient Instructions  Please continue Trelegy 1 inhalation once daily.  Rinse and gargle after you take this medication Keep Combivent available to use 2 puffs up to every 6 hours if needed for shortness of breath, chest tightness, wheezing. You can use your DuoNeb up to every 6 hours if needed for shortness of breath Add Flutter valve Twice daily   Guaifenesin 600 mg (generic Mucinex) once daily. Restart the azithromycin 250 mg once daily.  We will stay on this medication indefinitely Continue your Flonase nasal spray as you have been taking it Continue your Zyrtec and Singulair as you have been taking them  Get your lung cancer screening CT scan in September as planned Follow with Dr Delton Coombes in 3 months or sooner if you have any problems

## 2023-05-10 NOTE — Telephone Encounter (Signed)
Per chart note from 05/10/2023  Tammy Parrett:  Restart the azithromycin 250 mg once daily.  We will stay on this medication indefinitely   Called pharmacy.  Spoke with Donnie Coffin, pharmacist.  Jovita Gamma information.  Nothing further needed.

## 2023-05-10 NOTE — Telephone Encounter (Signed)
Pharm calling stating dosage instructions not conventional for this type of medication. Seeks correction  Azithromycin Take one tablet by mouth one per day.  Please resent or call Pharm to advise.  Sending High Priority Due to the type of medication needing to be started ASAP preferably. TY.

## 2023-05-10 NOTE — Patient Instructions (Addendum)
Please continue Trelegy 1 inhalation once daily.  Rinse and gargle after you take this medication Keep Combivent available to use 2 puffs up to every 6 hours if needed for shortness of breath, chest tightness, wheezing. You can use your DuoNeb up to every 6 hours if needed for shortness of breath Add Flutter valve Twice daily   Guaifenesin 600 mg (generic Mucinex) once daily. Restart the azithromycin 250 mg once daily.  We will stay on this medication indefinitely Continue your Flonase nasal spray as you have been taking it Continue your Zyrtec and Singulair as you have been taking them  Get your lung cancer screening CT scan in September as planned Follow with Dr Delton Coombes in 3 months or sooner if you have any problems

## 2023-05-10 NOTE — Assessment & Plan Note (Signed)
COPD-prone to recurrent flare , add flutter valve , restart daily Azithromycin. Try to use Duoneb daily to see if this helps with with cough/thick mucus .   Plan  Patient Instructions  Please continue Trelegy 1 inhalation once daily.  Rinse and gargle after you take this medication Keep Combivent available to use 2 puffs up to every 6 hours if needed for shortness of breath, chest tightness, wheezing. You can use your DuoNeb up to every 6 hours if needed for shortness of breath Add Flutter valve Twice daily   Guaifenesin 600 mg (generic Mucinex) once daily. Restart the azithromycin 250 mg once daily.  We will stay on this medication indefinitely Continue your Flonase nasal spray as you have been taking it Continue your Zyrtec and Singulair as you have been taking them  Get your lung cancer screening CT scan in September as planned Follow with Dr Delton Coombes in 3 months or sooner if you have any problems

## 2023-05-10 NOTE — Progress Notes (Signed)
Patient seen in the office today and instructed on use of flutter valve.  Patient expressed understanding and demonstrated technique.  

## 2023-05-30 ENCOUNTER — Telehealth (HOSPITAL_COMMUNITY): Payer: Self-pay | Admitting: Medical

## 2023-05-30 ENCOUNTER — Emergency Department (HOSPITAL_COMMUNITY): Payer: No Typology Code available for payment source

## 2023-05-30 ENCOUNTER — Encounter (HOSPITAL_COMMUNITY): Payer: Self-pay

## 2023-05-30 ENCOUNTER — Emergency Department (HOSPITAL_COMMUNITY)
Admission: EM | Admit: 2023-05-30 | Discharge: 2023-05-30 | Disposition: A | Discharge status: Home / Self Care | Payer: No Typology Code available for payment source | Attending: Emergency Medicine | Admitting: Emergency Medicine

## 2023-05-30 ENCOUNTER — Other Ambulatory Visit: Payer: Self-pay

## 2023-05-30 DIAGNOSIS — M25561 Pain in right knee: Secondary | ICD-10-CM | POA: Insufficient documentation

## 2023-05-30 DIAGNOSIS — Z23 Encounter for immunization: Secondary | ICD-10-CM | POA: Insufficient documentation

## 2023-05-30 DIAGNOSIS — L03115 Cellulitis of right lower limb: Secondary | ICD-10-CM | POA: Diagnosis not present

## 2023-05-30 DIAGNOSIS — M25571 Pain in right ankle and joints of right foot: Secondary | ICD-10-CM | POA: Diagnosis not present

## 2023-05-30 DIAGNOSIS — W19XXXA Unspecified fall, initial encounter: Secondary | ICD-10-CM | POA: Diagnosis not present

## 2023-05-30 DIAGNOSIS — S62114A Nondisplaced fracture of triquetrum [cuneiform] bone, right wrist, initial encounter for closed fracture: Secondary | ICD-10-CM | POA: Insufficient documentation

## 2023-05-30 DIAGNOSIS — M79671 Pain in right foot: Secondary | ICD-10-CM | POA: Insufficient documentation

## 2023-05-30 DIAGNOSIS — M7989 Other specified soft tissue disorders: Secondary | ICD-10-CM | POA: Diagnosis not present

## 2023-05-30 DIAGNOSIS — S6991XA Unspecified injury of right wrist, hand and finger(s), initial encounter: Secondary | ICD-10-CM | POA: Diagnosis present

## 2023-05-30 DIAGNOSIS — S62124A Nondisplaced fracture of lunate [semilunar], right wrist, initial encounter for closed fracture: Secondary | ICD-10-CM | POA: Diagnosis not present

## 2023-05-30 DIAGNOSIS — Z79899 Other long term (current) drug therapy: Secondary | ICD-10-CM | POA: Diagnosis not present

## 2023-05-30 MED ORDER — OXYCODONE-ACETAMINOPHEN 5-325 MG PO TABS
1.0000 | ORAL_TABLET | Freq: Once | ORAL | Status: AC
  Administered 2023-05-30: 1 via ORAL
  Filled 2023-05-30: qty 1

## 2023-05-30 MED ORDER — OXYCODONE-ACETAMINOPHEN 5-325 MG PO TABS: 1.0000 | ORAL_TABLET | Freq: Four times a day (QID) | ORAL | 0 refills | Status: DC | PRN

## 2023-05-30 MED ORDER — TETANUS-DIPHTH-ACELL PERTUSSIS 5-2.5-18.5 LF-MCG/0.5 IM SUSY
0.5000 mL | PREFILLED_SYRINGE | Freq: Once | INTRAMUSCULAR | Status: AC
  Administered 2023-05-30: 0.5 mL via INTRAMUSCULAR
  Filled 2023-05-30: qty 0.5

## 2023-05-30 MED ORDER — LIDOCAINE 5 % EX PTCH
1.0000 | MEDICATED_PATCH | CUTANEOUS | Status: DC
  Administered 2023-05-30: 1 via TRANSDERMAL
  Filled 2023-05-30: qty 1

## 2023-05-30 MED ORDER — CEPHALEXIN 500 MG PO CAPS
500.0000 mg | ORAL_CAPSULE | Freq: Once | ORAL | Status: AC
  Administered 2023-05-30: 500 mg via ORAL
  Filled 2023-05-30: qty 1

## 2023-05-30 MED ORDER — CEPHALEXIN 500 MG PO CAPS: 500.0000 mg | ORAL_CAPSULE | Freq: Two times a day (BID) | ORAL | 0 refills | Status: DC

## 2023-05-30 NOTE — Progress Notes (Signed)
Orthopedic Tech Progress Note Patient Details:  Sean Saunders 07-12-1946 [id number]  Ortho Devices Type of Ortho Device: CAM walker Ortho Device/Splint Location: right Ortho Device/Splint Interventions: Ordered, Application, Adjustment   Post Interventions Patient Tolerated: Well Instructions Provided: Adjustment of device, Care of device  Kizzie Fantasia 05/30/2023, 7:44 PM

## 2023-05-30 NOTE — Progress Notes (Signed)
Orthopedic Tech Progress Note Patient Details:  Sean Saunders 1946-08-27 [id number]  Ortho Devices Type of Ortho Device: Volar splint, Finger splint Ortho Device/Splint Location: right volar applied. right 2nd finger splinted Ortho Device/Splint Interventions: Ordered, Application, Adjustment   Post Interventions Patient Tolerated: Well Instructions Provided: Adjustment of device, Care of device  Kizzie Fantasia 05/30/2023, 6:38 PM

## 2023-05-30 NOTE — Telephone Encounter (Signed)
Received call, meds sent to wrong pharmacy, sent meds to correct pharmacy on [facility] on [address]

## 2023-05-30 NOTE — Progress Notes (Signed)
Right lower extremity venous study completed.   Preliminary results relayed to PA in ER.  Please see CV Procedures for preliminary results.  Lancer Thurner, RVT  7:11 PM 05/30/23

## 2023-05-30 NOTE — ED Notes (Signed)
Pt provided discharge instructions and prescription information. Pt was given the opportunity to ask questions and questions were answered.   

## 2023-05-30 NOTE — Discharge Instructions (Addendum)
Please make sure you elevate your leg, to help with the swelling, as well as take the Percocet for the pain.  There shows no evidence of any kind of fractures on your exam today your blood clot, however you should follow-up with orthopedics.  Additionally you have evidence of a hand fracture, you will need to follow-up with hand surgery, I provided you with information for this, please call and make an appointment.  Do not get your splint wet, make sure it is dry at all times, and use a trash bag to cover it in the shower.  Make sure that you are taking the Keflex as prescribed, return if you feel like the redness, swelling is getting worse.

## 2023-05-30 NOTE — Telephone Encounter (Signed)
Sent meds to new correct pharmacy.
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
## 2023-05-31 ENCOUNTER — Other Ambulatory Visit: Payer: Self-pay

## 2023-05-31 ENCOUNTER — Encounter (HOSPITAL_COMMUNITY): Payer: Self-pay

## 2023-05-31 ENCOUNTER — Inpatient Hospital Stay (HOSPITAL_COMMUNITY)
Admission: EM | Admit: 2023-05-31 | Discharge: 2023-06-05 | DRG: 603 | Disposition: A | Discharge status: Home / Self Care | Payer: No Typology Code available for payment source | Attending: Internal Medicine | Admitting: Internal Medicine

## 2023-05-31 DIAGNOSIS — Z79890 Hormone replacement therapy (postmenopausal): Secondary | ICD-10-CM

## 2023-05-31 DIAGNOSIS — L089 Local infection of the skin and subcutaneous tissue, unspecified: Principal | ICD-10-CM

## 2023-05-31 DIAGNOSIS — Z820 Family history of epilepsy and other diseases of the nervous system: Secondary | ICD-10-CM

## 2023-05-31 DIAGNOSIS — E785 Hyperlipidemia, unspecified: Secondary | ICD-10-CM | POA: Diagnosis present

## 2023-05-31 DIAGNOSIS — E1122 Type 2 diabetes mellitus with diabetic chronic kidney disease: Secondary | ICD-10-CM | POA: Diagnosis present

## 2023-05-31 DIAGNOSIS — Z87891 Personal history of nicotine dependence: Secondary | ICD-10-CM

## 2023-05-31 DIAGNOSIS — Y92838 Other recreation area as the place of occurrence of the external cause: Secondary | ICD-10-CM

## 2023-05-31 DIAGNOSIS — L03115 Cellulitis of right lower limb: Secondary | ICD-10-CM | POA: Diagnosis not present

## 2023-05-31 DIAGNOSIS — Z7984 Long term (current) use of oral hypoglycemic drugs: Secondary | ICD-10-CM

## 2023-05-31 DIAGNOSIS — E039 Hypothyroidism, unspecified: Secondary | ICD-10-CM | POA: Diagnosis present

## 2023-05-31 DIAGNOSIS — I119 Hypertensive heart disease without heart failure: Secondary | ICD-10-CM | POA: Diagnosis present

## 2023-05-31 DIAGNOSIS — Z96649 Presence of unspecified artificial hip joint: Secondary | ICD-10-CM | POA: Diagnosis present

## 2023-05-31 DIAGNOSIS — Z888 Allergy status to other drugs, medicaments and biological substances status: Secondary | ICD-10-CM

## 2023-05-31 DIAGNOSIS — N183 Chronic kidney disease, stage 3 unspecified: Secondary | ICD-10-CM | POA: Diagnosis present

## 2023-05-31 DIAGNOSIS — I69354 Hemiplegia and hemiparesis following cerebral infarction affecting left non-dominant side: Secondary | ICD-10-CM

## 2023-05-31 DIAGNOSIS — Z79899 Other long term (current) drug therapy: Secondary | ICD-10-CM

## 2023-05-31 DIAGNOSIS — G2581 Restless legs syndrome: Secondary | ICD-10-CM | POA: Diagnosis present

## 2023-05-31 DIAGNOSIS — N1831 Chronic kidney disease, stage 3a: Secondary | ICD-10-CM | POA: Diagnosis present

## 2023-05-31 DIAGNOSIS — Z9981 Dependence on supplemental oxygen: Secondary | ICD-10-CM

## 2023-05-31 DIAGNOSIS — W174XXA Fall from dock, initial encounter: Secondary | ICD-10-CM

## 2023-05-31 DIAGNOSIS — N400 Enlarged prostate without lower urinary tract symptoms: Secondary | ICD-10-CM | POA: Diagnosis present

## 2023-05-31 DIAGNOSIS — J449 Chronic obstructive pulmonary disease, unspecified: Secondary | ICD-10-CM | POA: Diagnosis present

## 2023-05-31 DIAGNOSIS — Z823 Family history of stroke: Secondary | ICD-10-CM

## 2023-05-31 DIAGNOSIS — J9611 Chronic respiratory failure with hypoxia: Secondary | ICD-10-CM | POA: Diagnosis present

## 2023-05-31 DIAGNOSIS — Z7902 Long term (current) use of antithrombotics/antiplatelets: Secondary | ICD-10-CM

## 2023-05-31 DIAGNOSIS — E1140 Type 2 diabetes mellitus with diabetic neuropathy, unspecified: Secondary | ICD-10-CM | POA: Diagnosis present

## 2023-05-31 DIAGNOSIS — Z794 Long term (current) use of insulin: Secondary | ICD-10-CM

## 2023-05-31 DIAGNOSIS — E1151 Type 2 diabetes mellitus with diabetic peripheral angiopathy without gangrene: Secondary | ICD-10-CM | POA: Diagnosis present

## 2023-05-31 DIAGNOSIS — S62111A Displaced fracture of triquetrum [cuneiform] bone, right wrist, initial encounter for closed fracture: Secondary | ICD-10-CM | POA: Diagnosis present

## 2023-05-31 DIAGNOSIS — I129 Hypertensive chronic kidney disease with stage 1 through stage 4 chronic kidney disease, or unspecified chronic kidney disease: Secondary | ICD-10-CM | POA: Diagnosis present

## 2023-05-31 DIAGNOSIS — Z66 Do not resuscitate: Secondary | ICD-10-CM | POA: Diagnosis present

## 2023-05-31 DIAGNOSIS — Z833 Family history of diabetes mellitus: Secondary | ICD-10-CM

## 2023-05-31 NOTE — ED Triage Notes (Signed)
Right lower leg injury and was seen yesterday.   Says he had many XR's done and had a splint placed on right arm.   Several scabs on lower legs. After CAM boot placed on right leg and he took it off at home it took all the scab off and has had redness growing up around the leg.   Family has been marking redness changes and time stamping images.   Uncontrolled T2DM.

## 2023-06-01 ENCOUNTER — Observation Stay (HOSPITAL_COMMUNITY): Payer: No Typology Code available for payment source

## 2023-06-01 DIAGNOSIS — I119 Hypertensive heart disease without heart failure: Secondary | ICD-10-CM

## 2023-06-01 DIAGNOSIS — L03115 Cellulitis of right lower limb: Secondary | ICD-10-CM | POA: Diagnosis present

## 2023-06-01 DIAGNOSIS — N1831 Chronic kidney disease, stage 3a: Secondary | ICD-10-CM

## 2023-06-01 DIAGNOSIS — E1140 Type 2 diabetes mellitus with diabetic neuropathy, unspecified: Secondary | ICD-10-CM | POA: Diagnosis not present

## 2023-06-01 DIAGNOSIS — Z794 Long term (current) use of insulin: Secondary | ICD-10-CM

## 2023-06-01 LAB — COMPREHENSIVE METABOLIC PANEL
ALT: 15 U/L (ref 0–44)
AST: 19 U/L (ref 15–41)
Albumin: 3.7 g/dL (ref 3.5–5.0)
Alkaline Phosphatase: 81 U/L (ref 38–126)
Anion gap: 7 (ref 5–15)
BUN: 27 mg/dL — ABNORMAL HIGH (ref 8–23)
CO2: 23 mmol/L (ref 22–32)
Calcium: 8.7 mg/dL — ABNORMAL LOW (ref 8.9–10.3)
Chloride: 108 mmol/L (ref 98–111)
Creatinine, Ser: 1.34 mg/dL — ABNORMAL HIGH (ref 0.61–1.24)
GFR, Estimated: 55 mL/min — ABNORMAL LOW (ref >60)
Glucose, Bld: 159 mg/dL — ABNORMAL HIGH (ref 70–99)
Potassium: 4.4 mmol/L (ref 3.5–5.1)
Sodium: 138 mmol/L (ref 135–145)
Total Bilirubin: 0.6 mg/dL (ref 0.3–1.2)
Total Protein: 6.4 g/dL — ABNORMAL LOW (ref 6.5–8.1)

## 2023-06-01 LAB — BASIC METABOLIC PANEL
Anion gap: 6 (ref 5–15)
BUN: 24 mg/dL — ABNORMAL HIGH (ref 8–23)
CO2: 20 mmol/L — ABNORMAL LOW (ref 22–32)
Calcium: 7.3 mg/dL — ABNORMAL LOW (ref 8.9–10.3)
Chloride: 112 mmol/L — ABNORMAL HIGH (ref 98–111)
Creatinine, Ser: 1.13 mg/dL (ref 0.61–1.24)
GFR, Estimated: >60 mL/min (ref >60)
Glucose, Bld: 132 mg/dL — ABNORMAL HIGH (ref 70–99)
Potassium: 3.9 mmol/L (ref 3.5–5.1)
Sodium: 138 mmol/L (ref 135–145)

## 2023-06-01 LAB — CBC
HCT: 40.8 % (ref 39.0–52.0)
Hemoglobin: 13 g/dL (ref 13.0–17.0)
MCH: 28.7 pg (ref 26.0–34.0)
MCHC: 31.9 g/dL (ref 30.0–36.0)
MCV: 90.1 fL (ref 80.0–100.0)
Platelets: 144 10*3/uL — ABNORMAL LOW (ref 150–400)
RBC: 4.53 MIL/uL (ref 4.22–5.81)
RDW: 15.3 % (ref 11.5–15.5)
WBC: 6.6 10*3/uL (ref 4.0–10.5)
nRBC: 0 % (ref 0.0–0.2)

## 2023-06-01 LAB — CBC WITH DIFFERENTIAL/PLATELET
Abs Immature Granulocytes: 0.09 10*3/uL — ABNORMAL HIGH (ref 0.00–0.07)
Basophils Absolute: 0.1 10*3/uL (ref 0.0–0.1)
Basophils Relative: 1 %
Eosinophils Absolute: 0.3 10*3/uL (ref 0.0–0.5)
Eosinophils Relative: 4 %
HCT: 41.5 % (ref 39.0–52.0)
Hemoglobin: 13.3 g/dL (ref 13.0–17.0)
Immature Granulocytes: 1 %
Lymphocytes Relative: 30 %
Lymphs Abs: 2.5 10*3/uL (ref 0.7–4.0)
MCH: 28.4 pg (ref 26.0–34.0)
MCHC: 32 g/dL (ref 30.0–36.0)
MCV: 88.7 fL (ref 80.0–100.0)
Monocytes Absolute: 0.8 10*3/uL (ref 0.1–1.0)
Monocytes Relative: 10 %
Neutro Abs: 4.6 10*3/uL (ref 1.7–7.7)
Neutrophils Relative %: 54 %
Platelets: 174 10*3/uL (ref 150–400)
RBC: 4.68 MIL/uL (ref 4.22–5.81)
RDW: 15.4 % (ref 11.5–15.5)
WBC: 8.4 10*3/uL (ref 4.0–10.5)
nRBC: 0 % (ref 0.0–0.2)

## 2023-06-01 LAB — GLUCOSE, CAPILLARY
Glucose-Capillary: 183 mg/dL — ABNORMAL HIGH (ref 70–99)
Glucose-Capillary: 163 mg/dL — ABNORMAL HIGH (ref 70–99)
Glucose-Capillary: 194 mg/dL — ABNORMAL HIGH (ref 70–99)

## 2023-06-01 LAB — C-REACTIVE PROTEIN: CRP: 3 mg/dL — ABNORMAL HIGH (ref <1.0)

## 2023-06-01 LAB — SEDIMENTATION RATE: Sed Rate: 8 mm/hr (ref 0–16)

## 2023-06-01 LAB — MRSA NEXT GEN BY PCR, NASAL: MRSA by PCR Next Gen: NOT DETECTED

## 2023-06-01 LAB — CBG MONITORING, ED: Glucose-Capillary: 138 mg/dL — ABNORMAL HIGH (ref 70–99)

## 2023-06-01 LAB — HEMOGLOBIN A1C
Hgb A1c MFr Bld: 7 % — ABNORMAL HIGH (ref 4.8–5.6)
Mean Plasma Glucose: 154.2 mg/dL

## 2023-06-01 MED ORDER — ONDANSETRON HCL 4 MG PO TABS: 4.0000 mg | ORAL_TABLET | Freq: Four times a day (QID) | ORAL | Status: DC | PRN

## 2023-06-01 MED ORDER — ENOXAPARIN SODIUM 40 MG/0.4ML IJ SOSY
40.0000 mg | PREFILLED_SYRINGE | INTRAMUSCULAR | Status: DC
  Administered 2023-06-01 – 2023-06-04 (×4): 40 mg via SUBCUTANEOUS
  Filled 2023-06-01 (×5): qty 0.4

## 2023-06-01 MED ORDER — IPRATROPIUM-ALBUTEROL 0.5-2.5 (3) MG/3ML IN SOLN: 3.0000 mL | Freq: Four times a day (QID) | RESPIRATORY_TRACT | Status: DC | PRN

## 2023-06-01 MED ORDER — OXYCODONE-ACETAMINOPHEN 5-325 MG PO TABS
1.0000 | ORAL_TABLET | Freq: Four times a day (QID) | ORAL | Status: DC | PRN
  Administered 2023-06-01 – 2023-06-05 (×10): 1 via ORAL
  Filled 2023-06-01 (×10): qty 1

## 2023-06-01 MED ORDER — LEVOTHYROXINE SODIUM 125 MCG PO TABS
125.0000 ug | ORAL_TABLET | Freq: Every day | ORAL | Status: DC
  Administered 2023-06-01 – 2023-06-05 (×5): 125 ug via ORAL
  Filled 2023-06-01 (×5): qty 1

## 2023-06-01 MED ORDER — UMECLIDINIUM BROMIDE 62.5 MCG/ACT IN AEPB
1.0000 | INHALATION_SPRAY | Freq: Every day | RESPIRATORY_TRACT | Status: DC
  Administered 2023-06-02 – 2023-06-05 (×4): 1 via RESPIRATORY_TRACT
  Filled 2023-06-01: qty 7

## 2023-06-01 MED ORDER — TAMSULOSIN HCL 0.4 MG PO CAPS
0.4000 mg | ORAL_CAPSULE | Freq: Two times a day (BID) | ORAL | Status: DC
  Administered 2023-06-01 – 2023-06-05 (×9): 0.4 mg via ORAL
  Filled 2023-06-01 (×9): qty 1

## 2023-06-01 MED ORDER — ROSUVASTATIN CALCIUM 20 MG PO TABS
20.0000 mg | ORAL_TABLET | Freq: Every day | ORAL | Status: DC
  Administered 2023-06-01 – 2023-06-05 (×5): 20 mg via ORAL
  Filled 2023-06-01 (×5): qty 1

## 2023-06-01 MED ORDER — SODIUM CHLORIDE 0.9 % IV SOLN
2.0000 g | Freq: Two times a day (BID) | INTRAVENOUS | Status: DC
  Administered 2023-06-01 – 2023-06-05 (×8): 2 g via INTRAVENOUS
  Filled 2023-06-01 (×9): qty 12.5

## 2023-06-01 MED ORDER — AMLODIPINE BESYLATE 10 MG PO TABS
10.0000 mg | ORAL_TABLET | Freq: Every day | ORAL | Status: DC
  Administered 2023-06-01 – 2023-06-05 (×5): 10 mg via ORAL
  Filled 2023-06-01 (×5): qty 1

## 2023-06-01 MED ORDER — ROPINIROLE HCL 0.5 MG PO TABS
0.2500 mg | ORAL_TABLET | Freq: Every day | ORAL | Status: DC
  Administered 2023-06-01 – 2023-06-04 (×4): 0.25 mg via ORAL
  Filled 2023-06-01 (×4): qty 1

## 2023-06-01 MED ORDER — ACETAMINOPHEN 650 MG RE SUPP: 650.0000 mg | Freq: Four times a day (QID) | RECTAL | Status: DC | PRN

## 2023-06-01 MED ORDER — VANCOMYCIN HCL 2000 MG/400ML IV SOLN
2000.0000 mg | Freq: Once | INTRAVENOUS | Status: AC
  Administered 2023-06-01: 2000 mg via INTRAVENOUS
  Filled 2023-06-01: qty 400

## 2023-06-01 MED ORDER — CLOPIDOGREL BISULFATE 75 MG PO TABS
75.0000 mg | ORAL_TABLET | Freq: Every day | ORAL | Status: DC
  Administered 2023-06-01 – 2023-06-05 (×5): 75 mg via ORAL
  Filled 2023-06-01 (×6): qty 1

## 2023-06-01 MED ORDER — METFORMIN HCL 500 MG PO TABS
500.0000 mg | ORAL_TABLET | Freq: Two times a day (BID) | ORAL | Status: DC
  Administered 2023-06-01 – 2023-06-05 (×9): 500 mg via ORAL
  Filled 2023-06-01 (×9): qty 1

## 2023-06-01 MED ORDER — SODIUM CHLORIDE 0.9 % IV SOLN
2.0000 g | Freq: Once | INTRAVENOUS | Status: AC
  Administered 2023-06-01: 2 g via INTRAVENOUS
  Filled 2023-06-01: qty 12.5

## 2023-06-01 MED ORDER — FLUTICASONE PROPIONATE 50 MCG/ACT NA SUSP
2.0000 | Freq: Every day | NASAL | Status: DC
  Administered 2023-06-01 – 2023-06-05 (×5): 2 via NASAL
  Filled 2023-06-01 (×2): qty 16

## 2023-06-01 MED ORDER — INSULIN ASPART 100 UNIT/ML IJ SOLN
6.0000 [IU] | Freq: Three times a day (TID) | INTRAMUSCULAR | Status: DC
  Administered 2023-06-01 – 2023-06-03 (×8): 6 [IU] via SUBCUTANEOUS
  Filled 2023-06-01: qty 0.06

## 2023-06-01 MED ORDER — FLUTICASONE FUROATE-VILANTEROL 100-25 MCG/ACT IN AEPB
1.0000 | INHALATION_SPRAY | Freq: Every day | RESPIRATORY_TRACT | Status: DC
  Administered 2023-06-02 – 2023-06-05 (×4): 1 via RESPIRATORY_TRACT
  Filled 2023-06-01: qty 28

## 2023-06-01 MED ORDER — CARVEDILOL 12.5 MG PO TABS
12.5000 mg | ORAL_TABLET | Freq: Two times a day (BID) | ORAL | Status: DC
  Administered 2023-06-01 – 2023-06-05 (×9): 12.5 mg via ORAL
  Filled 2023-06-01 (×9): qty 1

## 2023-06-01 MED ORDER — ALBUTEROL SULFATE HFA 108 (90 BASE) MCG/ACT IN AERS: 2.0000 | INHALATION_SPRAY | Freq: Four times a day (QID) | RESPIRATORY_TRACT | Status: DC | PRN

## 2023-06-01 MED ORDER — IPRATROPIUM-ALBUTEROL 20-100 MCG/ACT IN AERS: 1.0000 | INHALATION_SPRAY | Freq: Two times a day (BID) | RESPIRATORY_TRACT | Status: DC | PRN

## 2023-06-01 MED ORDER — ONDANSETRON HCL 4 MG/2ML IJ SOLN
4.0000 mg | Freq: Four times a day (QID) | INTRAMUSCULAR | Status: DC | PRN
  Administered 2023-06-04: 4 mg via INTRAVENOUS
  Filled 2023-06-01: qty 2

## 2023-06-01 MED ORDER — KETOROLAC TROMETHAMINE 15 MG/ML IJ SOLN
15.0000 mg | Freq: Once | INTRAMUSCULAR | Status: AC
  Administered 2023-06-01: 15 mg via INTRAVENOUS
  Filled 2023-06-01: qty 1

## 2023-06-01 MED ORDER — MEDIHONEY WOUND/BURN DRESSING EX PSTE
1.0000 | PASTE | Freq: Every day | CUTANEOUS | Status: DC
  Administered 2023-06-01 – 2023-06-04 (×4): 1 via TOPICAL
  Filled 2023-06-01: qty 44

## 2023-06-01 MED ORDER — ACETAMINOPHEN 325 MG PO TABS
650.0000 mg | ORAL_TABLET | Freq: Four times a day (QID) | ORAL | Status: DC | PRN
  Administered 2023-06-01 – 2023-06-05 (×7): 650 mg via ORAL
  Filled 2023-06-01 (×7): qty 2

## 2023-06-01 MED ORDER — INSULIN ASPART 100 UNIT/ML IJ SOLN
0.0000 [IU] | Freq: Three times a day (TID) | INTRAMUSCULAR | Status: DC
  Administered 2023-06-01: 4 [IU] via SUBCUTANEOUS
  Administered 2023-06-01: 3 [IU] via SUBCUTANEOUS
  Administered 2023-06-01: 4 [IU] via SUBCUTANEOUS
  Administered 2023-06-02 (×3): 3 [IU] via SUBCUTANEOUS
  Administered 2023-06-03 (×2): 4 [IU] via SUBCUTANEOUS
  Administered 2023-06-04 – 2023-06-05 (×3): 3 [IU] via SUBCUTANEOUS
  Filled 2023-06-01: qty 0.2

## 2023-06-01 MED ORDER — ALLOPURINOL 100 MG PO TABS
100.0000 mg | ORAL_TABLET | Freq: Two times a day (BID) | ORAL | Status: DC
  Administered 2023-06-01 – 2023-06-05 (×9): 100 mg via ORAL
  Filled 2023-06-01 (×10): qty 1

## 2023-06-01 MED ORDER — FUROSEMIDE 20 MG PO TABS
20.0000 mg | ORAL_TABLET | Freq: Every day | ORAL | Status: DC
  Administered 2023-06-01 – 2023-06-05 (×5): 20 mg via ORAL
  Filled 2023-06-01 (×5): qty 1

## 2023-06-01 MED ORDER — AZITHROMYCIN 250 MG PO TABS
250.0000 mg | ORAL_TABLET | Freq: Every day | ORAL | Status: DC
  Administered 2023-06-01 – 2023-06-05 (×5): 250 mg via ORAL
  Filled 2023-06-01 (×5): qty 1

## 2023-06-01 MED ORDER — INSULIN GLARGINE-YFGN 100 UNIT/ML ~~LOC~~ SOLN
65.0000 [IU] | Freq: Every day | SUBCUTANEOUS | Status: DC
  Administered 2023-06-01 – 2023-06-05 (×5): 65 [IU] via SUBCUTANEOUS
  Filled 2023-06-01 (×6): qty 0.65

## 2023-06-01 MED ORDER — HYDROCODONE-ACETAMINOPHEN 5-325 MG PO TABS
1.0000 | ORAL_TABLET | Freq: Once | ORAL | Status: AC
  Administered 2023-06-01: 1 via ORAL
  Filled 2023-06-01: qty 1

## 2023-06-01 MED ORDER — MONTELUKAST SODIUM 10 MG PO TABS
10.0000 mg | ORAL_TABLET | Freq: Every day | ORAL | Status: DC
  Administered 2023-06-01 – 2023-06-04 (×4): 10 mg via ORAL
  Filled 2023-06-01 (×4): qty 1

## 2023-06-01 MED ORDER — EMPAGLIFLOZIN 25 MG PO TABS
25.0000 mg | ORAL_TABLET | Freq: Every day | ORAL | Status: DC
  Administered 2023-06-01 – 2023-06-05 (×5): 25 mg via ORAL
  Filled 2023-06-01 (×6): qty 1

## 2023-06-01 MED ORDER — BENAZEPRIL HCL 20 MG PO TABS
40.0000 mg | ORAL_TABLET | Freq: Every day | ORAL | Status: DC
  Administered 2023-06-01 – 2023-06-05 (×5): 40 mg via ORAL
  Filled 2023-06-01 (×6): qty 2

## 2023-06-01 MED ORDER — LATANOPROST 0.005 % OP SOLN
1.0000 [drp] | Freq: Every day | OPHTHALMIC | Status: DC
  Administered 2023-06-02 – 2023-06-04 (×3): 1 [drp] via OPHTHALMIC
  Filled 2023-06-01 (×2): qty 2.5

## 2023-06-01 NOTE — Progress Notes (Addendum)
PROGRESS NOTE    MANVILLE WOLAVER  ZOX:[id number] DOB: 1946/08/08 DOA: 05/31/2023 PCP: [facility]    Brief Narrative:   SEVERUS AGUIAR is a 77 y.o. male with past medical history significant for type 2 diabetes mellitus, essential hypertension, PAD, hypothyroidism, RLS, history of TIA who presented to [facility] ED on 6/19 due to progressive redness, pain to his right lower extremity.  Patient reports he fell off a dock at [facility] on 616 sustaining injury to his right shin, right knee and left ankle.  Patient started developing erythema and swelling and was seen in the ED on 6/18 and diagnosed with a right wrist fracture and started on Keflex for cellulitis.  He received a tetanus shot in the ED during that visit.  Despite treatment with Keflex, cellulitis has rapidly progressed in which he sought further care once again in the ED.  In the ED, temperature 98.3 F, HR 80, RR 17, BP 156/74, SpO2 95% on room air.  WBC 8.4, hemoglobin 13.3, platelets 174.  Sodium 138, potassium 4.4, chloride 108, CO2 23, glucose 159, BUN 27, creatinine 1.34.  AST 19, ALT 15, total bilirubin 0.6.  CRP 3.0.  Right tibia/fibula x-ray with no acute osseous abnormality, soft tissues are unremarkable.  Patient was started on vancomycin and cefepime.  TRH consulted for admission for further evaluation management of progressive cellulitis poorly responsive to outpatient antibiotics.  Assessment & Plan:   Right lower extremity cellulitis Patient presenting with progressive erythema, tenderness to right lower extremity despite outpatient antibiotic use with Keflex.  Although Keflex dose was likely not sufficient.  [place]  Received tetanus shot on 6/18.  Patient is afebrile without leukocytosis.  X-ray right tibia/fibula with no acute findings. -- Wound care consult -- Continue cefepime (consider transition for Levaquin once improved to  complete 10-day course)  Right triquetral fracture Right hand x-ray from 6/18 notable for small osseous fleck along the dorsum of the wrist consistent with a triquetral fracture. -- Continue splint -- Outpatient follow-up with PCP/orthopedics  Type 2 diabetes mellitus Hemoglobin A1c 7.0, well-controlled.  Home regimen includes Jardiance, Lantus 65 units subcutaneously daily, NovoLog 40 units 3 times daily AC, metformin 5 mg p.o. twice daily. -- Semglee 65u [place] daily -- NovoLog 6 u TIDAC -- SSI for coverage -- CBGs qAC/HS  Essential hypertension -- Amlodipine 10 mg p.o. daily -- Carvedilol 12.5 mg p.o. twice daily -- Benazepril 40 mg p.o. daily -- Furosemide 20 mg p.o. daily -- Continue statin and Plavix  Hyperlipidemia: -- Crestor 20 mg p.o. daily  COPD Follows with [facility]ology outpatient; Dr. Delton Coombes.  Previously oxygen dependent which now has been weaned off.  Most recent visit 04/2023, apparently on Trelegy inhaler and longtime azithromycin use. -- Singulair 10 mg p.o. nightly -- DuoNebs every 6 hours as needed wheezing/shortness of breath  PAD -- Continue Plavix 75 mg p.o. daily  Restless leg syndrome -- Requip 0.25 mg p.o. nightly  BPH -- Tamsulosin 0.4 mg p.o. twice daily    DVT prophylaxis: enoxaparin (LOVENOX) injection 40 mg Start: 06/01/23 1000    Code Status: DNR Family Communication: Updated daughter present at bedside this morning  Disposition Plan:  Level of care: Med-Surg Status is: Observation The patient remains OBS appropriate and will d/c before 2 midnights.    Consultants:  None  Procedures:  None  Antimicrobials:  Vancomycin 6/19 - 6/19 Cefepime 6/19>>   Subjective: Patient seen  examined bedside, resting comfortably.  Remains in ED holding area.  Complains of pain and continue erythema to right lower extremity.  Remains on IV cefepime.  Discussed with pharmacist, ID pharmacist and infectious disease this morning, recommend  continue cefepime and once improved can consider transition to Levaquin to complete 10-day course.  Patient with no other questions, concerns or complaints at this time; other than it is his birthday tomorrow and he is going to be missing out on some fun activities.  Patient denies headache, no dizziness, no chest pain, no palpitations, no shortness of breath, no abdominal pain, no fever/chills/night sweats, no nausea/vomiting/diarrhea, no focal weakness, no fatigue, no paresthesias.  No acute events overnight per nursing staff.  Objective: Vitals:   06/01/23 0400 06/01/23 0600 06/01/23 0730 06/01/23 0846  BP: (!) 148/67 (!) 154/65  (!) 171/65  Pulse: 67 66  63  Resp:  18  16  Temp:   97.8 F (36.6 C) (!) 97.5 F (36.4 C)  TempSrc:   Oral Oral  SpO2: 96% 95%  100%  Weight:      Height:       No intake or output data in the 24 hours ending 06/01/23 1140 Filed Weights   05/31/23 2327  Weight: 97.5 kg    Examination:  Physical Exam: GEN: NAD, alert and oriented x 3, wd/wn HEENT: NCAT, PERRL, EOMI, sclera clear, MMM PULM: CTAB w/o wheezes/crackles, normal respiratory effort, on room air CV: RRR w/o M/G/R GI: abd soft, NTND, NABS, no R/G/M MSK: no peripheral edema, muscle strength globally intact 5/5 bilateral upper/lower extremities NEURO: CN II-XII intact, no focal deficits, sensation to light touch intact PSYCH: normal mood/affect Integumentary: Right lower extremity anterior shin with erythema, TTP without fluctuance/purulent drainage, noted right knee abrasion, left medial ankle lesion, otherwise no other concerning rashes/lesions/wounds noted on exposed skin surfaces.        Data Reviewed: I have personally reviewed following labs and imaging studies  CBC: Recent Labs  Lab 06/01/23 0019 06/01/23 0558  WBC 8.4 6.6  NEUTROABS 4.6  --   HGB 13.3 13.0  HCT 41.5 40.8  MCV 88.7 90.1  PLT 174 144*   Basic Metabolic Panel: Recent Labs  Lab 06/01/23 0019  06/01/23 0558  NA 138 138  K 4.4 3.9  CL 108 112*  CO2 23 20*  GLUCOSE 159* 132*  BUN 27* 24*  CREATININE 1.34* 1.13  CALCIUM 8.7* 7.3*   GFR: Estimated Creatinine Clearance: 66.3 mL/min (by C-G formula based on SCr of 1.13 mg/dL). Liver Function Tests: Recent Labs  Lab 06/01/23 0019  AST 19  ALT 15  ALKPHOS 81  BILITOT 0.6  PROT 6.4*  ALBUMIN 3.7   No results for input(s): "LIPASE", "AMYLASE" in the last 168 hours. No results for input(s): "AMMONIA" in the last 168 hours. Coagulation Profile: No results for input(s): "INR", "PROTIME" in the last 168 hours. Cardiac Enzymes: No results for input(s): "CKTOTAL", "CKMB", "CKMBINDEX", "TROPONINI" in the last 168 hours. BNP (last 3 results) No results for input(s): "PROBNP" in the last 8760 hours. HbA1C: Recent Labs    06/01/23 0559  HGBA1C 7.0*   CBG: Recent Labs  Lab 06/01/23 0758  GLUCAP 138*   Lipid Profile: No results for input(s): "CHOL", "HDL", "LDLCALC", "TRIG", "CHOLHDL", "LDLDIRECT" in the last 72 hours. Thyroid Function Tests: No results for input(s): "TSH", "T4TOTAL", "FREET4", "T3FREE", "THYROIDAB" in the last 72 hours. Anemia Panel: No results for input(s): "VITAMINB12", "FOLATE", "FERRITIN", "TIBC", "IRON", "RETICCTPCT" in the last  72 hours. Sepsis Labs: No results for input(s): "PROCALCITON", "LATICACIDVEN" in the last 168 hours.  Recent Results (from the past 240 hour(s))  MRSA Next Gen by PCR, Nasal     Status: None   Collection Time: 06/01/23  2:07 AM   Specimen: Nasal Mucosa; Nasal Swab  Result Value Ref Range Status   MRSA by PCR Next Gen NOT DETECTED NOT DETECTED Final    Comment: (NOTE) The GeneXpert MRSA Assay (FDA approved for NASAL specimens only), is one component of a comprehensive MRSA colonization surveillance program. It is not intended to diagnose MRSA infection nor to guide or monitor treatment for MRSA infections. Test performance is not FDA approved in patients less than 15  years old. Performed at [facility], [address] [place], [place] [address]          Radiology Studies: DG Tibia/Fibula Right  Result Date: 06/01/2023 CLINICAL DATA:  Cellulitis of right leg. EXAM: RIGHT TIBIA AND FIBULA - 2 VIEW COMPARISON:  05/30/2023. FINDINGS: There is no evidence of fracture or other focal bone lesions. No bony erosion or periosteal elevation. Mild calcaneal spurring is noted. Soft tissues are unremarkable. IMPRESSION: No acute osseous abnormality. Electronically Signed   By: Thornell Sartorius M.D.   On: 06/01/2023 04:06   VAS [place] LOWER EXTREMITY VENOUS (DVT)  Result Date: 05/31/2023  Lower Venous DVT Study Patient Name:  DEMEKO TRABUE  Date of Exam:   05/30/2023 Medical Rec #: [id number]        Accession #:    [id number] Date of Birth: 04/26/1946        Patient Gender: M Patient Age:   89 years Exam Location:  [facility] Procedure:      VAS [place] LOWER EXTREMITY VENOUS (DVT) Referring Phys: Nehemiah Settle SMALL --------------------------------------------------------------------------------  Indications: Swelling.  Limitations: Body habitus. Comparison Study: Previous 05/01/21 negative Performing Technologist: McKayla Maag RVT, [place]  Examination Guidelines: A complete evaluation includes B-mode imaging, spectral Doppler, color Doppler, and power Doppler as needed of all accessible portions of each vessel. Bilateral testing is considered an integral part of a complete examination. Limited examinations for reoccurring indications may be performed as noted. The reflux portion of the exam is performed with the patient in reverse Trendelenburg.  +---------+---------------+---------+-----------+----------+--------------+ RIGHT    CompressibilityPhasicitySpontaneityPropertiesThrombus Aging +---------+---------------+---------+-----------+----------+--------------+ CFV      Full           Yes      Yes                                  +---------+---------------+---------+-----------+----------+--------------+ SFJ      Full                                                        +---------+---------------+---------+-----------+----------+--------------+ FV Prox  Full                                                        +---------+---------------+---------+-----------+----------+--------------+ FV Mid   Full                                                        +---------+---------------+---------+-----------+----------+--------------+  FV DistalFull                                                        +---------+---------------+---------+-----------+----------+--------------+ PFV      Full                                                        +---------+---------------+---------+-----------+----------+--------------+ POP      Full           Yes      Yes                                 +---------+---------------+---------+-----------+----------+--------------+ PTV      Full                                                        +---------+---------------+---------+-----------+----------+--------------+ PERO     Full                                         limited        +---------+---------------+---------+-----------+----------+--------------+   +----+---------------+---------+-----------+----------+--------------+ LEFTCompressibilityPhasicitySpontaneityPropertiesThrombus Aging +----+---------------+---------+-----------+----------+--------------+ CFV Full           Yes      Yes                                 +----+---------------+---------+-----------+----------+--------------+ SFJ Full                                                        +----+---------------+---------+-----------+----------+--------------+     Summary: RIGHT: - There is no evidence of deep vein thrombosis in the lower extremity. However, portions of this examination were limited- see  technologist comments above.  - No cystic structure found in the popliteal fossa.  LEFT: - No evidence of common femoral vein obstruction.  *See table(s) above for measurements and observations. Electronically signed by Waverly Ferrari MD on 05/31/2023 at 7:18:26 AM.    Final    DG Shoulder Right  Result Date: 05/30/2023 CLINICAL DATA:  Pain EXAM: RIGHT SHOULDER - 3 VIEW COMPARISON:  None Available. FINDINGS: There is no evidence of fracture or dislocation. There is no evidence of arthropathy or other focal bone abnormality. Soft tissues are unremarkable. IMPRESSION: Negative. Electronically Signed   By: Allegra Lai M.D.   On: 05/30/2023 18:25   DG Knee Complete 4 Views Right  Result Date: 05/30/2023 CLINICAL DATA:  fall, pain EXAM: RIGHT KNEE - COMPLETE 4+ VIEW; RIGHT TIBIA AND FIBULA - 2 VIEW; RIGHT ANKLE - COMPLETE 3+ VIEW; RIGHT FOOT COMPLETE - 3+ VIEW COMPARISON:  None Available. FINDINGS: No acute  fracture or dislocation. Joint spaces and alignment are maintained. No area of erosion or osseous destruction. No unexpected radiopaque foreign body. Scattered soft tissue calcifications. Soft tissue edema. IMPRESSION: 1. No acute fracture or dislocation. 2. Soft tissue edema. Electronically Signed   By: Meda Klinefelter M.D.   On: 05/30/2023 17:03   DG Tibia/Fibula Right  Result Date: 05/30/2023 CLINICAL DATA:  fall, pain EXAM: RIGHT KNEE - COMPLETE 4+ VIEW; RIGHT TIBIA AND FIBULA - 2 VIEW; RIGHT ANKLE - COMPLETE 3+ VIEW; RIGHT FOOT COMPLETE - 3+ VIEW COMPARISON:  None Available. FINDINGS: No acute fracture or dislocation. Joint spaces and alignment are maintained. No area of erosion or osseous destruction. No unexpected radiopaque foreign body. Scattered soft tissue calcifications. Soft tissue edema. IMPRESSION: 1. No acute fracture or dislocation. 2. Soft tissue edema. Electronically Signed   By: Meda Klinefelter M.D.   On: 05/30/2023 17:03   DG Ankle Complete Right  Result Date:  05/30/2023 CLINICAL DATA:  fall, pain EXAM: RIGHT KNEE - COMPLETE 4+ VIEW; RIGHT TIBIA AND FIBULA - 2 VIEW; RIGHT ANKLE - COMPLETE 3+ VIEW; RIGHT FOOT COMPLETE - 3+ VIEW COMPARISON:  None Available. FINDINGS: No acute fracture or dislocation. Joint spaces and alignment are maintained. No area of erosion or osseous destruction. No unexpected radiopaque foreign body. Scattered soft tissue calcifications. Soft tissue edema. IMPRESSION: 1. No acute fracture or dislocation. 2. Soft tissue edema. Electronically Signed   By: Meda Klinefelter M.D.   On: 05/30/2023 17:03   DG Foot Complete Right  Result Date: 05/30/2023 CLINICAL DATA:  fall, pain EXAM: RIGHT KNEE - COMPLETE 4+ VIEW; RIGHT TIBIA AND FIBULA - 2 VIEW; RIGHT ANKLE - COMPLETE 3+ VIEW; RIGHT FOOT COMPLETE - 3+ VIEW COMPARISON:  None Available. FINDINGS: No acute fracture or dislocation. Joint spaces and alignment are maintained. No area of erosion or osseous destruction. No unexpected radiopaque foreign body. Scattered soft tissue calcifications. Soft tissue edema. IMPRESSION: 1. No acute fracture or dislocation. 2. Soft tissue edema. Electronically Signed   By: Meda Klinefelter M.D.   On: 05/30/2023 17:03   DG Forearm Right  Result Date: 05/30/2023 CLINICAL DATA:  fall, distal radius pain, 2nd metacarpal pain EXAM: RIGHT FOREARM - 2 VIEW; RIGHT HAND - COMPLETE 3+ VIEW COMPARISON:  February 19, 2005 FINDINGS: Status post amputation of the distal second phalanx. There is a small osseous fleck along the dorsum of the wrist consistent with a triquetral fracture. Soft tissue edema. No unexpected radiopaque foreign body. IMPRESSION: There is a small osseous fleck along the dorsum of the wrist consistent with a triquetral fracture. Electronically Signed   By: Meda Klinefelter M.D.   On: 05/30/2023 16:59   DG Hand Complete Right  Result Date: 05/30/2023 CLINICAL DATA:  fall, distal radius pain, 2nd metacarpal pain EXAM: RIGHT FOREARM - 2 VIEW; RIGHT HAND -  COMPLETE 3+ VIEW COMPARISON:  February 19, 2005 FINDINGS: Status post amputation of the distal second phalanx. There is a small osseous fleck along the dorsum of the wrist consistent with a triquetral fracture. Soft tissue edema. No unexpected radiopaque foreign body. IMPRESSION: There is a small osseous fleck along the dorsum of the wrist consistent with a triquetral fracture. Electronically Signed   By: Meda Klinefelter M.D.   On: 05/30/2023 16:59        Scheduled Meds:  allopurinol  100 mg Oral BID   amLODipine  10 mg Oral Daily   benazepril  40 mg Oral Daily   carvedilol  12.5 mg  Oral BID WC   clopidogrel  75 mg Oral Daily   empagliflozin  25 mg Oral Daily   enoxaparin (LOVENOX) injection  40 mg Subcutaneous Q24H   fluticasone  2 spray Each Nare Daily   furosemide  20 mg Oral Daily   insulin aspart  0-20 Units Subcutaneous TID WC   insulin aspart  6 Units Subcutaneous TID WC   insulin glargine-yfgn  65 Units Subcutaneous Daily   latanoprost  1 drop Both Eyes QHS   levothyroxine  125 mcg Oral Q0600   metFORMIN  500 mg Oral BID WC   montelukast  10 mg Oral QHS   rOPINIRole  0.25 mg Oral QHS   rosuvastatin  20 mg Oral Daily   tamsulosin  0.4 mg Oral BID   Continuous Infusions:  ceFEPime (MAXIPIME) IV       LOS: 0 days    Time spent: 52 minutes spent on chart review, discussion with nursing staff, consultants, updating family and interview/physical exam; more than 50% of that time was spent in counseling and/or coordination of care.    Alvira Philips [place], DO [facility] Available via Epic secure chat 7am-7pm After these hours, please refer to coverage provider listed on [email or web address] 06/01/2023, 11:40 AM

## 2023-06-01 NOTE — ED Provider Notes (Signed)
[place] EMERGENCY DEPARTMENT AT [facility] Provider Note  CSN: [id number] Arrival date & time: 05/31/23 2259  Chief Complaint(s) Wound Check  HPI Sean Saunders is a 77 y.o. male with PMH COPD, DVT, HTN, HLD, hypothyroidism, previous TIAs who presents emergency room for evaluation of wound check.  Patient was seen in the emergency room and yesterday after [place] suffering a right distal radius fracture and wounds to his right lower extremity.  He was discharged on Keflex but his erythema around the wound over his right shin has progressively worsened and is very tender to palpation.  Denies associated fever, chest pain, shortness of breath, headache, nausea, vomiting or other systemic symptoms.   Past Medical History Past Medical History:  Diagnosis Date   Arthritis    COPD (chronic obstructive pulmonary disease) (HCC)    Diabetes mellitus    DVT (deep venous thrombosis) (HCC)    GERD (gastroesophageal reflux disease)    Hyperlipidemia    Hypertension    Hypothyroidism    Onychomycosis 06/07/2013   Stroke (HCC)    TIA's 01/05/11-01/08/11-01/26/11   TIA (transient ischemic attack) 08/03/2011   Left sided weakness. 3 episodes Feb-March   Patient Active Problem List   Diagnosis Date Noted   Cellulitis of right leg 06/01/2023   COPD (chronic obstructive pulmonary disease) (HCC) 05/10/2023   Acute sinusitis 07/21/2022   Cerumen impaction 07/21/2022   Back pain 03/28/2022   Stridor 08/31/2020   Chronic respiratory failure with hypoxia (HCC) 08/31/2020   Depression 07/16/2020   Acute respiratory failure with hypoxia (HCC) 06/27/2020   Fever    Embolism and thrombosis of arteries of lower extremity (HCC) 01/10/2020   Cholelithiasis 12/31/2019   Acute pancreatitis 12/31/2019   Chronic sinusitis 09/03/2019   CKD (chronic kidney disease), stage III (HCC) 09/17/2018   Meningioma, cerebral (HCC) 09/17/2018   Vestibular schwannoma (HCC)  09/17/2018   Rhinitis, allergic 09/25/2017   Tobacco use disorder 09/11/2017   COPD with acute lower respiratory infection (HCC) 09/10/2017   Hypertension with heart disease 09/04/2017   Pain in lower limb 03/21/2014   Type 2 diabetes mellitus with diabetic neuropathy, unspecified (HCC) 03/21/2014   PVD (peripheral vascular disease) (HCC) 12/11/2013   Occlusion and stenosis of carotid artery without mention of cerebral infarction 12/11/2013   Onychomycosis 06/07/2013   Pain in joint, ankle and foot 06/07/2013   Callus of foot 06/07/2013   Peripheral vascular disease, unspecified (HCC) 05/29/2013   Aftercare following surgery of the circulatory system, NEC 05/29/2013   Other postoperative infection 11/13/2012   Neuropathic pain of lower extremity 11/23/2011   Atherosclerosis of native arteries of the extremities with intermittent claudication 10/12/2011   Home Medication(s) Prior to Admission medications   Medication Sig Start Date End Date Taking? Authorizing Provider  allopurinol (ZYLOPRIM) 100 MG tablet Take 100 mg by mouth 2 (two) times daily.   Yes [provider]  amLODipine (NORVASC) 10 MG tablet Take 10 mg by mouth daily.   Yes [provider]  benazepril (LOTENSIN) 40 MG tablet Take 40 mg by mouth daily. 03/27/23  Yes [provider]  carvedilol (COREG) 12.5 MG tablet Take 12.5 mg by mouth 2 (two) times daily with a meal.   Yes [provider]  cephALEXin (KEFLEX) 500 MG capsule Take 1 capsule (500 mg total) by mouth 2 (two) times daily. 05/30/23  Yes Small, Brooke L, PA  cetirizine (ZYRTEC) 10 MG tablet TAKE 1 TABLET(10 MG)  BY MOUTH DAILY 07/11/21  Yes Leslye Peer, MD  clopidogrel (PLAVIX) 75 MG tablet Take 75 mg by mouth daily.   Yes [provider]  empagliflozin (JARDIANCE) 25 MG TABS tablet Take 25 mg by mouth daily.   Yes [provider]  fluticasone (FLONASE) 50 MCG/ACT nasal spray Place 2 sprays into both nostrils  daily. 08/07/19  Yes [place], Betty G, MD  furosemide (LASIX) 20 MG tablet Take 20 mg by mouth daily.   Yes [provider]  insulin aspart (NOVOLOG FLEXPEN) 100 UNIT/ML FlexPen Inject 40 Units into the skin 3 (three) times daily with meals.   Yes [provider]  insulin glargine (LANTUS) 100 UNIT/ML injection Inject 65 Units into the skin in the morning.   Yes [provider]  Ipratropium-Albuterol (COMBIVENT RESPIMAT) 20-100 MCG/ACT AERS respimat Inhale 1 puff into the lungs 2 (two) times daily as needed for wheezing or shortness of breath.   Yes [provider]  ipratropium-albuterol (DUONEB) 0.5-2.5 (3) MG/3ML SOLN Take 3 mLs by nebulization every 6 (six) hours as needed (sob & wheezing).   Yes [provider]  latanoprost (XALATAN) 0.005 % ophthalmic solution Place 1 drop into both eyes at bedtime.   Yes [provider]  levothyroxine (SYNTHROID) 125 MCG tablet Take 125 mcg by mouth daily before breakfast.   Yes [provider]  metFORMIN (GLUCOPHAGE) 500 MG tablet Take 500 mg by mouth 2 (two) times daily with a meal.   Yes [provider]  montelukast (SINGULAIR) 10 MG tablet TAKE 1 TABLET(10 MG) BY MOUTH AT BEDTIME 03/22/23  Yes Byrum, Les Pou, MD  Multiple Vitamin (MULTIVITAMIN WITH MINERALS) TABS tablet Take 1 tablet by mouth daily.   Yes [provider]  oxyCODONE-acetaminophen (PERCOCET/ROXICET) 5-325 MG tablet Take 1 tablet by mouth every 6 (six) hours as needed for severe pain. 05/30/23  Yes Small, Brooke L, PA  rOPINIRole (REQUIP) 0.25 MG tablet Take 0.25 mg by mouth at bedtime.   Yes [provider]  rosuvastatin (CRESTOR) 40 MG tablet Take 20 mg by mouth in the morning and at bedtime. 04/06/20  Yes [provider]  tamsulosin (FLOMAX) 0.4 MG CAPS capsule Take 0.4 mg by mouth in the morning and at bedtime.   Yes [provider]  albuterol (VENTOLIN HFA) 108 (90 Base) MCG/ACT inhaler  Inhale 2 puffs into the lungs every 6 (six) hours as needed for wheezing or shortness of breath.    [provider]  amLODipine (NORVASC) 10 MG tablet Take 1 tablet by mouth daily. 08/06/21 08/07/22  [provider]  carvedilol (COREG) 25 MG tablet Take 1 tablet (25 mg total) by mouth 2 (two) times daily with a meal. 05/02/21 03/28/22  Arrien, York Ram, MD  Past Surgical History Past Surgical History:  Procedure Laterality Date   ABDOMINAL AORTAGRAM N/A 06/04/2012   Procedure: ABDOMINAL Ronny Flurry;  Surgeon: Chuck Hint, MD;  Location: [facility] CATH LAB;  Service: Cardiovascular;  Laterality: N/A;   APPENDECTOMY     COLONOSCOPY     FEMORAL-POPLITEAL BYPASS GRAFT  07/05/2012   Procedure: BYPASS GRAFT FEMORAL-POPLITEAL ARTERY;  Surgeon: Chuck Hint, MD;  Location: [facility] OR;  Service: Vascular;  Laterality: Left;  Revision Left fem/pop w/ artery patch angioplasty utilizing left GSV graft and extenion to tib/per trunk w/right GSV and interop arteriogram   JOINT REPLACEMENT  2011   -Hip   NASAL SINUS SURGERY     PR VEIN BYPASS GRAFT,AORTO-FEM-POP     PR VEIN BYPASS GRAFT,AORTO-FEM-POP  08-01-11   (L) Fem-pop BP   toe nail     TONSILLECTOMY     Family History Family History  Problem Relation Age of Onset   Stroke Mother    Diabetes Mother    Other Father        alzheimers   Diabetes Father    Stroke Father    Diabetes Sister     Social History Social History   Tobacco Use   Smoking status: Former    Packs/day: 1.00    Years: 25.00    Additional pack years: 0.00    Total pack years: 25.00    Types: Cigarettes    Quit date: 06/05/2018    Years since quitting: 4.9   Smokeless tobacco: Never  Vaping Use   Vaping Use: Never used  Substance Use Topics   Alcohol use: Not Currently    Alcohol/week: 0.0 standard drinks of  alcohol    Comment: rare   Drug use: No   Allergies Eszopiclone, Niaspan [niacin er], Finasteride, Niacin, and Other  Review of Systems Review of Systems  Skin:  Positive for rash and wound.    Physical Exam Vital Signs  I have reviewed the triage vital signs BP (!) 156/74 (BP Location: Left Arm)   Pulse 80   Temp 98 F (36.7 C) (Oral)   Resp 17   Ht 6' (1.829 m)   Wt 97.5 kg   SpO2 95%   BMI 29.16 kg/m   Physical Exam Constitutional:      General: He is not in acute distress.    Appearance: Normal appearance.  HENT:     Head: Normocephalic and atraumatic.     Nose: No congestion or rhinorrhea.  Eyes:     General:        Right eye: No discharge.        Left eye: No discharge.     Extraocular Movements: Extraocular movements intact.     Pupils: Pupils are equal, round, and reactive to light.  Cardiovascular:     Rate and Rhythm: Normal rate and regular rhythm.     Heart sounds: No murmur heard. Pulmonary:     Effort: No respiratory distress.     Breath sounds: No wheezing or rales.  Abdominal:     General: There is no distension.     Tenderness: There is no abdominal tenderness.  Musculoskeletal:        General: Normal range of motion.     Cervical back: Normal range of motion.  Skin:    General: Skin is warm and dry.     Findings: Erythema and rash present.  Neurological:     General: No focal deficit present.     Mental Status:  He is alert.     ED Results and Treatments Labs (all labs ordered are listed, but only abnormal results are displayed) Labs Reviewed  COMPREHENSIVE METABOLIC PANEL - Abnormal; Notable for the following components:      Result Value   Glucose, Bld 159 (*)    BUN 27 (*)    Creatinine, Ser 1.34 (*)    Calcium 8.7 (*)    Total Protein 6.4 (*)    GFR, Estimated 55 (*)    All other components within normal limits  CBC WITH DIFFERENTIAL/PLATELET - Abnormal; Notable for the following components:   Abs Immature Granulocytes  0.09 (*)    All other components within normal limits  MRSA NEXT GEN BY PCR, NASAL  SEDIMENTATION RATE  C-REACTIVE PROTEIN  HEMOGLOBIN A1C  CBC  BASIC METABOLIC PANEL                                                                                                                          Radiology No results found.  Pertinent labs & imaging results that were available during my care of the patient were reviewed by me and considered in my medical decision making (see MDM for details).  Medications Ordered in ED Medications  vancomycin (VANCOREADY) IVPB 2000 mg/400 mL (2,000 mg Intravenous New Bag/Given 06/01/23 0250)  ceFEPIme (MAXIPIME) 2 g in sodium chloride 0.9 % 100 mL IVPB (has no administration in time range)  amLODipine (NORVASC) tablet 10 mg (has no administration in time range)  benazepril (LOTENSIN) tablet 40 mg (has no administration in time range)  carvedilol (COREG) tablet 12.5 mg (has no administration in time range)  allopurinol (ZYLOPRIM) tablet 100 mg (has no administration in time range)  clopidogrel (PLAVIX) tablet 75 mg (has no administration in time range)  empagliflozin (JARDIANCE) tablet 25 mg (has no administration in time range)  furosemide (LASIX) tablet 20 mg (has no administration in time range)  insulin glargine-yfgn (SEMGLEE) injection 65 Units (has no administration in time range)  oxyCODONE-acetaminophen (PERCOCET/ROXICET) 5-325 MG per tablet 1 tablet (has no administration in time range)  rosuvastatin (CRESTOR) tablet 20 mg (has no administration in time range)  rOPINIRole (REQUIP) tablet 0.25 mg (has no administration in time range)  tamsulosin (FLOMAX) capsule 0.4 mg (has no administration in time range)  levothyroxine (SYNTHROID) tablet 125 mcg (has no administration in time range)  metFORMIN (GLUCOPHAGE) tablet 500 mg (has no administration in time range)  montelukast (SINGULAIR) tablet 10 mg (has no administration in time range)  latanoprost  (XALATAN) 0.005 % ophthalmic solution 1 drop (has no administration in time range)  ipratropium-albuterol (DUONEB) 0.5-2.5 (3) MG/3ML nebulizer solution 3 mL (has no administration in time range)  fluticasone (FLONASE) 50 MCG/ACT nasal spray 2 spray (has no administration in time range)  insulin aspart (novoLOG) injection 6 Units (has no administration in time range)  insulin aspart (novoLOG) injection 0-20 Units (has no administration in time range)  enoxaparin (LOVENOX) injection 40 mg (has no  administration in time range)  acetaminophen (TYLENOL) tablet 650 mg (has no administration in time range)    Or  acetaminophen (TYLENOL) suppository 650 mg (has no administration in time range)  ondansetron (ZOFRAN) tablet 4 mg (has no administration in time range)    Or  ondansetron (ZOFRAN) injection 4 mg (has no administration in time range)  ketorolac (TORADOL) 15 MG/ML injection 15 mg (15 mg Intravenous Given 06/01/23 0116)  HYDROcodone-acetaminophen (NORCO/VICODIN) 5-325 MG per tablet 1 tablet (1 tablet Oral Given 06/01/23 0116)  ceFEPIme (MAXIPIME) 2 g in sodium chloride 0.9 % 100 mL IVPB (2 g Intravenous New Bag/Given 06/01/23 0155)                                                                                                                                     Procedures Procedures  (including critical care time)  Medical Decision Making / ED Course   This patient presents to the ED for concern of wound check, this involves an extensive number of treatment options, and is a complaint that carries with it a high risk of complications and morbidity.  The differential diagnosis includes cellulitis, necrotizing fasciitis, fracture, DVT, stasis dermatitis, wound infection  MDM: Patient seen emergency room for evaluation of right lower extremity wound check.  Physical exam with progressive erythema around the site of a granulating open wound over the right shin.  Tender to palpation.  Laboratory  evaluation is overall reassuring outside of a BUN of 27, creatinine 1.34.  Sed rate is normal.  I did offer dalbavancin but patient would like to be admitted for broad-spectrum antibiotics given his history of diabetes.  Patient started on Vanco cefepime and admitted for failure of outpatient antibiotics and right lower extremity cellulitis.   Additional history obtained: -Additional history obtained from daughter -External records from outside source obtained and reviewed including: Chart review including previous notes, labs, imaging, consultation notes   Lab Tests: -I ordered, reviewed, and interpreted labs.   The pertinent results include:   Labs Reviewed  COMPREHENSIVE METABOLIC PANEL - Abnormal; Notable for the following components:      Result Value   Glucose, Bld 159 (*)    BUN 27 (*)    Creatinine, Ser 1.34 (*)    Calcium 8.7 (*)    Total Protein 6.4 (*)    GFR, Estimated 55 (*)    All other components within normal limits  CBC WITH DIFFERENTIAL/PLATELET - Abnormal; Notable for the following components:   Abs Immature Granulocytes 0.09 (*)    All other components within normal limits  MRSA NEXT GEN BY PCR, NASAL  SEDIMENTATION RATE  C-REACTIVE PROTEIN  HEMOGLOBIN A1C  CBC  BASIC METABOLIC PANEL      Medicines ordered and prescription drug management: Meds ordered this encounter  Medications   ketorolac (TORADOL) 15 MG/ML injection 15 mg   HYDROcodone-acetaminophen (NORCO/VICODIN) 5-325 MG per tablet 1 tablet  vancomycin (VANCOREADY) IVPB 2000 mg/400 mL    Order Specific Question:   Indication:    Answer:   Wound Infection   ceFEPIme (MAXIPIME) 2 g in sodium chloride 0.9 % 100 mL IVPB    Order Specific Question:   Antibiotic Indication:    Answer:   Wound Infection   ceFEPIme (MAXIPIME) 2 g in sodium chloride 0.9 % 100 mL IVPB    Order Specific Question:   Antibiotic Indication:    Answer:   Wound Infection   amLODipine (NORVASC) tablet 10 mg   benazepril  (LOTENSIN) tablet 40 mg   carvedilol (COREG) tablet 12.5 mg   allopurinol (ZYLOPRIM) tablet 100 mg   DISCONTD: albuterol (VENTOLIN HFA) 108 (90 Base) MCG/ACT inhaler 2 puff   clopidogrel (PLAVIX) tablet 75 mg   empagliflozin (JARDIANCE) tablet 25 mg   furosemide (LASIX) tablet 20 mg   insulin glargine-yfgn (SEMGLEE) injection 65 Units   oxyCODONE-acetaminophen (PERCOCET/ROXICET) 5-325 MG per tablet 1 tablet   rosuvastatin (CRESTOR) tablet 20 mg   rOPINIRole (REQUIP) tablet 0.25 mg   tamsulosin (FLOMAX) capsule 0.4 mg   levothyroxine (SYNTHROID) tablet 125 mcg   metFORMIN (GLUCOPHAGE) tablet 500 mg   montelukast (SINGULAIR) tablet 10 mg   latanoprost (XALATAN) 0.005 % ophthalmic solution 1 drop   ipratropium-albuterol (DUONEB) 0.5-2.5 (3) MG/3ML nebulizer solution 3 mL   DISCONTD: Ipratropium-Albuterol (COMBIVENT) respimat 1 puff   fluticasone (FLONASE) 50 MCG/ACT nasal spray 2 spray   insulin aspart (novoLOG) injection 6 Units   insulin aspart (novoLOG) injection 0-20 Units    Order Specific Question:   Correction coverage:    Answer:   Resistant (obese, steroids)    Order Specific Question:   CBG < 70:    Answer:   Implement Hypoglycemia Standing Orders and refer to Hypoglycemia Standing Orders sidebar report    Order Specific Question:   CBG 70 - 120:    Answer:   0 units    Order Specific Question:   CBG 121 - 150:    Answer:   3 units    Order Specific Question:   CBG 151 - 200:    Answer:   4 units    Order Specific Question:   CBG 201 - 250:    Answer:   7 units    Order Specific Question:   CBG 251 - 300:    Answer:   11 units    Order Specific Question:   CBG 301 - 350:    Answer:   15 units    Order Specific Question:   CBG 351 - 400:    Answer:   20 units    Order Specific Question:   CBG > 400    Answer:   call MD and obtain STAT lab verification   enoxaparin (LOVENOX) injection 40 mg   OR Linked Order Group    acetaminophen (TYLENOL) tablet 650 mg     acetaminophen (TYLENOL) suppository 650 mg   OR Linked Order Group    ondansetron (ZOFRAN) tablet 4 mg    ondansetron (ZOFRAN) injection 4 mg    -I have reviewed the patients home medicines and have made adjustments as needed  Critical interventions none    Cardiac Monitoring: The patient was maintained on a cardiac monitor.  I personally viewed and interpreted the cardiac monitored which showed an underlying rhythm of: NSR  Social Determinants of Health:  Factors impacting patients care include: none   Reevaluation: After the interventions noted above, I reevaluated  the patient and found that they have :improved  Co morbidities that complicate the patient evaluation  Past Medical History:  Diagnosis Date   Arthritis    COPD (chronic obstructive pulmonary disease) (HCC)    Diabetes mellitus    DVT (deep venous thrombosis) (HCC)    GERD (gastroesophageal reflux disease)    Hyperlipidemia    Hypertension    Hypothyroidism    Onychomycosis 06/07/2013   Stroke (HCC)    TIA's 01/05/11-01/08/11-01/26/11   TIA (transient ischemic attack) 08/03/2011   Left sided weakness. 3 episodes Feb-March      Dispostion: I considered admission for this patient, and due to cellulitis with failure of outpatient antibiotics patient require hospital admission     Final Clinical Impression(s) / ED Diagnoses Final diagnoses:  Wound infection     @PCDICTATION @    Glendora Score, MD 06/01/23 [phone]

## 2023-06-01 NOTE — Progress Notes (Signed)
Pharmacy Antibiotic Note  Sean Saunders is a 77 y.o. male admitted on 05/31/2023 with wound infection. Pharmacy has been consulted for Cefepime dosing.  Plan: Cefepime 2gm IV q12h Monitor renal function and cx data   Height: 6' (182.9 cm) Weight: 97.5 kg (215 lb) IBW/kg (Calculated) : 77.6  Temp (24hrs), Avg:98.3 F (36.8 C), Min:98.3 F (36.8 C), Max:98.3 F (36.8 C)  Recent Labs  Lab 06/01/23 0019  WBC 8.4  CREATININE 1.34*    Estimated Creatinine Clearance: 55.9 mL/min (A) (by C-G formula based on SCr of 1.34 mg/dL (H)).    Allergies  Allergen Reactions   Eszopiclone Itching   Niaspan [Niacin Er] Nausea And Vomiting and Rash   Finasteride    Niacin Other (See Comments)   Other Hives and Nausea And Vomiting    Ozempic and     Thank you for allowing pharmacy to be a part of this patient's care.  Junita Push PharmD 06/01/2023 2:04 AM

## 2023-06-01 NOTE — Assessment & Plan Note (Signed)
Creat 1.3 today, presumably at or near baseline given h/o CKD 3 in chart (any better at baseline and he wouldn't have CKD 3).

## 2023-06-01 NOTE — Progress Notes (Signed)
PHARMACY NOTE -  Cefepime  Pharmacy has been assisting with dosing of cefepime for cellulitis associated with freshwater injury. Dosage remains stable at 2g IV q8 hr and further renal adjustments per institutional Pharmacy antibiotic protocol ID Pharmacist recommends levofloxacin to cover freshwater flora Seems improved on Cefepime alone per MD; if remains stable on cefepime, consider transitioning to levofloxacin once ready for PO to complete 10d abx  Pharmacy will sign off, following peripherally for culture results, dose adjustments, and length of therapy. Please reconsult if a change in clinical status warrants re-evaluation of dosage.  Bernadene Person, PharmD, BCPS [phone] 06/01/2023, 9:42 AM

## 2023-06-01 NOTE — Progress Notes (Signed)
A consult was received from an ED physician for Vancomycin + Cefepime per pharmacy dosing.  The patient's profile has been reviewed for ht/wt/allergies/indication/available labs.   A one time order has been placed for Vancomycin 2gm + Cefepime 2gm IV.  Further antibiotics/pharmacy consults should be ordered by admitting physician if indicated.                       Thank you, Junita Push PharmD 06/01/2023  1:22 AM

## 2023-06-01 NOTE — Assessment & Plan Note (Addendum)
Cont home BP meds. Cont lasix, jardiance

## 2023-06-01 NOTE — TOC CM/SW Note (Signed)
Transition of Care [facility]) - Inpatient Brief Assessment  Patient Details  Name: Sean Saunders MRN: [id number] Date of Birth: 01/11/1946  [facility]) CM/SW Contact:    Ewing Schlein, LCSW Phone Number: 06/01/2023, 11:02 AM  Clinical Narrative: Screening completed. No TOC needs identified at this time.  Transition of Care Asessment: Insurance and Status: Insurance coverage has been reviewed Patient has primary care physician: Yes Home environment has been reviewed: Resides in an apartment Prior level of function:: Independent Prior/Current Home Services: No current home services Social Determinants of Health Reivew: SDOH reviewed no interventions necessary Readmission risk has been reviewed: Yes Transition of care needs: no transition of care needs at this time

## 2023-06-01 NOTE — Assessment & Plan Note (Signed)
Cont Lantus 65u Daily Hold his 40u TID AC Use 6u TID AC + resistant scale SSI (may need to increase AC dosage).

## 2023-06-01 NOTE — Consult Note (Signed)
WOC Nurse Consult Note: Reason for Consult:Fall 05/28/23 with injury to right knee, right anterior lower leg and left medial malleolus wounds. Also had wrist fracture.  Was started on Keflex, presents today with cellulitis to right anterior lower leg and nonhealing trauma wounds.  Wound type:trauma Pressure Injury POA: NA Measurement: photo in chart, bedside RN to measure on admission skin assessment.  Wound bed: 100% scabbed and dry Drainage (amount, consistency, odor) scant serosanguinous  no odor Periwound:  Deep erythema to right anterior lower leg around abrasion.  Dressing procedure/placement/frequency: Cleanse wounds from fall (right knee, right lower leg and left ankle) with NS and pat dry. Apply medihoney to wound bed.  Cover with foam dressing.  Peel back and replace medihoney daily  Replace foam every three days.  Will not follow at this time.  Please re-consult if needed.  Mike Gip MSN, RN, FNP-BC CWON Wound, Ostomy, Continence Nurse Outpatient [facility] [phone] Pager [phone]

## 2023-06-01 NOTE — H&P (Addendum)
History and Physical    Patient: Sean Saunders GNF:[id number] DOB: 05-02-1946 DOA: 05/31/2023 DOS: the patient was seen and examined on 06/01/2023 PCP: [facility]  Patient coming from: Home  Chief Complaint:  Chief Complaint  Patient presents with   Wound Check   HPI: Sean Saunders is a 77 y.o. male with medical history significant of PAD, DM2, HTN, TIAs.  Pt fell off of a dock on 6/16.  Seen in ED 6/18 after daughter found out and brought him in to ED.  Diagnosed with R wrist fx and started on keflex for cellulitis.  Got tetanus shot in ED during that visit.  Since that time cellulitis has rapidly worsened despite Keflex.  Pt back in to ED earlier this evening.   Review of Systems: As mentioned in the history of present illness. All other systems reviewed and are negative. Past Medical History:  Diagnosis Date   Arthritis    COPD (chronic obstructive pulmonary disease) (HCC)    Diabetes mellitus    DVT (deep venous thrombosis) (HCC)    GERD (gastroesophageal reflux disease)    Hyperlipidemia    Hypertension    Hypothyroidism    Onychomycosis 06/07/2013   Stroke (HCC)    TIA's 01/05/11-01/08/11-01/26/11   TIA (transient ischemic attack) 08/03/2011   Left sided weakness. 3 episodes Feb-March   Past Surgical History:  Procedure Laterality Date   ABDOMINAL AORTAGRAM N/A 06/04/2012   Procedure: ABDOMINAL Ronny Flurry;  Surgeon: Chuck Hint, MD;  Location: [facility] CATH LAB;  Service: Cardiovascular;  Laterality: N/A;   APPENDECTOMY     COLONOSCOPY     FEMORAL-POPLITEAL BYPASS GRAFT  07/05/2012   Procedure: BYPASS GRAFT FEMORAL-POPLITEAL ARTERY;  Surgeon: Chuck Hint, MD;  Location: [facility] OR;  Service: Vascular;  Laterality: Left;  Revision Left fem/pop w/ artery patch angioplasty utilizing left GSV graft and extenion to tib/per trunk w/right GSV and interop arteriogram   JOINT REPLACEMENT  2011   -Hip   NASAL SINUS SURGERY     PR VEIN BYPASS  GRAFT,AORTO-FEM-POP     PR VEIN BYPASS GRAFT,AORTO-FEM-POP  08-01-11   (L) Fem-pop BP   toe nail     TONSILLECTOMY     Social History:  reports that he quit smoking about 4 years ago. His smoking use included cigarettes. He has a 25.00 pack-year smoking history. He has never used smokeless tobacco. He reports that he does not currently use alcohol. He reports that he does not use drugs.  Allergies  Allergen Reactions   Eszopiclone Itching   Niaspan [Niacin Er] Nausea And Vomiting and Rash   Finasteride    Niacin Other (See Comments)   Other Hives and Nausea And Vomiting    Ozempic and     Family History  Problem Relation Age of Onset   Stroke Mother    Diabetes Mother    Other Father        alzheimers   Diabetes Father    Stroke Father    Diabetes Sister     Prior to Admission medications   Medication Sig Start Date End Date Taking? Authorizing Provider  allopurinol (ZYLOPRIM) 100 MG tablet Take 100 mg by mouth 2 (two) times daily.   Yes [provider]  amLODipine (NORVASC) 10 MG tablet Take 10 mg by mouth daily.   Yes [provider]  benazepril (LOTENSIN) 40 MG tablet Take 40 mg by mouth daily. 03/27/23  Yes [provider]  carvedilol (COREG) 12.5 MG tablet Take  12.5 mg by mouth 2 (two) times daily with a meal.   Yes [provider]  cephALEXin (KEFLEX) 500 MG capsule Take 1 capsule (500 mg total) by mouth 2 (two) times daily. 05/30/23  Yes Small, Brooke L, PA  cetirizine (ZYRTEC) 10 MG tablet TAKE 1 TABLET(10 MG) BY MOUTH DAILY 07/11/21  Yes Byrum, Les Pou, MD  clopidogrel (PLAVIX) 75 MG tablet Take 75 mg by mouth daily.   Yes [provider]  empagliflozin (JARDIANCE) 25 MG TABS tablet Take 25 mg by mouth daily.   Yes [provider]  fluticasone (FLONASE) 50 MCG/ACT nasal spray Place 2 sprays into both nostrils daily. 08/07/19  Yes [place], Betty G, MD  furosemide (LASIX) 20 MG tablet Take 20 mg by mouth daily.   Yes  [provider]  insulin aspart (NOVOLOG FLEXPEN) 100 UNIT/ML FlexPen Inject 40 Units into the skin 3 (three) times daily with meals.   Yes [provider]  insulin glargine (LANTUS) 100 UNIT/ML injection Inject 65 Units into the skin in the morning.   Yes [provider]  Ipratropium-Albuterol (COMBIVENT RESPIMAT) 20-100 MCG/ACT AERS respimat Inhale 1 puff into the lungs 2 (two) times daily as needed for wheezing or shortness of breath.   Yes [provider]  ipratropium-albuterol (DUONEB) 0.5-2.5 (3) MG/3ML SOLN Take 3 mLs by nebulization every 6 (six) hours as needed (sob & wheezing).   Yes [provider]  latanoprost (XALATAN) 0.005 % ophthalmic solution Place 1 drop into both eyes at bedtime.   Yes [provider]  levothyroxine (SYNTHROID) 125 MCG tablet Take 125 mcg by mouth daily before breakfast.   Yes [provider]  metFORMIN (GLUCOPHAGE) 500 MG tablet Take 500 mg by mouth 2 (two) times daily with a meal.   Yes [provider]  montelukast (SINGULAIR) 10 MG tablet TAKE 1 TABLET(10 MG) BY MOUTH AT BEDTIME 03/22/23  Yes Byrum, Les Pou, MD  Multiple Vitamin (MULTIVITAMIN WITH MINERALS) TABS tablet Take 1 tablet by mouth daily.   Yes [provider]  oxyCODONE-acetaminophen (PERCOCET/ROXICET) 5-325 MG tablet Take 1 tablet by mouth every 6 (six) hours as needed for severe pain. 05/30/23  Yes Small, Brooke L, PA  rOPINIRole (REQUIP) 0.25 MG tablet Take 0.25 mg by mouth at bedtime.   Yes [provider]  rosuvastatin (CRESTOR) 40 MG tablet Take 20 mg by mouth in the morning and at bedtime. 04/06/20  Yes [provider]  tamsulosin (FLOMAX) 0.4 MG CAPS capsule Take 0.4 mg by mouth in the morning and at bedtime.   Yes [provider]  albuterol (VENTOLIN HFA) 108 (90 Base) MCG/ACT inhaler Inhale 2 puffs into the lungs every 6 (six) hours as needed for wheezing or shortness of breath.     [provider]  amLODipine (NORVASC) 10 MG tablet Take 1 tablet by mouth daily. 08/06/21 08/07/22  [provider]  carvedilol (COREG) 25 MG tablet Take 1 tablet (25 mg total) by mouth 2 (two) times daily with a meal. 05/02/21 03/28/22  Arrien, York Ram, MD    Physical Exam: Vitals:   05/31/23 2314 05/31/23 2327 06/01/23 0332  BP: (!) 156/74    Pulse: 80    Resp: 17    Temp: 98.3 F (36.8 C)  98 F (36.7 C)  TempSrc: Oral  Oral  SpO2: 95%    Weight:  97.5 kg   Height:  6' (1.829 m)    Constitutional: NAD, calm, comfortable Respiratory: clear to auscultation bilaterally,  no wheezing, no crackles. Normal respiratory effort. No accessory muscle use.  Cardiovascular: Regular rate and rhythm, no murmurs / rubs / gallops. No extremity edema. 2+ pedal pulses. No carotid bruits.  Abdomen: no tenderness, no masses palpated. No hepatosplenomegaly. Bowel sounds positive.  Musculoskeletal: R wrist in splint Skin: Anterior shin wound with erythema, no crepitus, does have TTP but no pain out of proportion to injury, no pain beyond site of erythema.  See photo below:   Second wound to anterior R knee, with some drainage, not really any surrounding erythema though, again no crepitus, no pain out of proportion etc:  Neurologic: CN 2-12 grossly intact. Sensation intact, DTR normal. Strength 5/5 in all 4.  Psychiatric: Normal judgment and insight. Alert and oriented x 3. Normal mood.   Data Reviewed:    Labs on Admission: I have personally reviewed following labs and imaging studies  CBC: Recent Labs  Lab 06/01/23 0019  WBC 8.4  NEUTROABS 4.6  HGB 13.3  HCT 41.5  MCV 88.7  PLT 174   Basic Metabolic Panel: Recent Labs  Lab 06/01/23 0019  NA 138  K 4.4  CL 108  CO2 23  GLUCOSE 159*  BUN 27*  CREATININE 1.34*  CALCIUM 8.7*   GFR: Estimated Creatinine Clearance: 55.9 mL/min (A) (by C-G formula based on SCr of 1.34 mg/dL (H)). Liver Function Tests: Recent  Labs  Lab 06/01/23 0019  AST 19  ALT 15  ALKPHOS 81  BILITOT 0.6  PROT 6.4*  ALBUMIN 3.7   No results for input(s): "LIPASE", "AMYLASE" in the last 168 hours. No results for input(s): "AMMONIA" in the last 168 hours. Coagulation Profile: No results for input(s): "INR", "PROTIME" in the last 168 hours. Cardiac Enzymes: No results for input(s): "CKTOTAL", "CKMB", "CKMBINDEX", "TROPONINI" in the last 168 hours. BNP (last 3 results) No results for input(s): "PROBNP" in the last 8760 hours. HbA1C: No results for input(s): "HGBA1C" in the last 72 hours. CBG: No results for input(s): "GLUCAP" in the last 168 hours. Lipid Profile: No results for input(s): "CHOL", "HDL", "LDLCALC", "TRIG", "CHOLHDL", "LDLDIRECT" in the last 72 hours. Thyroid Function Tests: No results for input(s): "TSH", "T4TOTAL", "FREET4", "T3FREE", "THYROIDAB" in the last 72 hours. Anemia Panel: No results for input(s): "VITAMINB12", "FOLATE", "FERRITIN", "TIBC", "IRON", "RETICCTPCT" in the last 72 hours. Urine analysis:    Component Value Date/Time   COLORURINE YELLOW 06/27/2020 0547   APPEARANCEUR CLEAR 06/27/2020 0547   LABSPEC 1.013 06/27/2020 0547   PHURINE 5.0 06/27/2020 0547   GLUCOSEU >=500 (A) 06/27/2020 0547   HGBUR NEGATIVE 06/27/2020 0547   BILIRUBINUR NEGATIVE 06/27/2020 0547   KETONESUR 5 (A) 06/27/2020 0547   PROTEINUR 100 (A) 06/27/2020 0547   UROBILINOGEN 0.2 07/03/2012 0937   NITRITE NEGATIVE 06/27/2020 0547   LEUKOCYTESUR NEGATIVE 06/27/2020 0547    Radiological Exams on Admission: VAS [place] LOWER EXTREMITY VENOUS (DVT)  Result Date: 05/31/2023  Lower Venous DVT Study Patient Name:  JAMARIE BIAN  Date of Exam:   05/30/2023 Medical Rec #: [id number]        Accession #:    [id number] Date of Birth: 03-18-1946        Patient Gender: M Patient Age:   32 years Exam Location:  [facility] Procedure:      VAS [place] LOWER EXTREMITY VENOUS (DVT) Referring Phys: Nehemiah Settle SMALL  --------------------------------------------------------------------------------  Indications: Swelling.  Limitations: Body habitus. Comparison Study: Previous 05/01/21 negative Performing Technologist: McKayla Maag RVT, [place]  Examination Guidelines: A complete  evaluation includes B-mode imaging, spectral Doppler, color Doppler, and power Doppler as needed of all accessible portions of each vessel. Bilateral testing is considered an integral part of a complete examination. Limited examinations for reoccurring indications may be performed as noted. The reflux portion of the exam is performed with the patient in reverse Trendelenburg.  +---------+---------------+---------+-----------+----------+--------------+ RIGHT    CompressibilityPhasicitySpontaneityPropertiesThrombus Aging +---------+---------------+---------+-----------+----------+--------------+ CFV      Full           Yes      Yes                                 +---------+---------------+---------+-----------+----------+--------------+ SFJ      Full                                                        +---------+---------------+---------+-----------+----------+--------------+ FV Prox  Full                                                        +---------+---------------+---------+-----------+----------+--------------+ FV Mid   Full                                                        +---------+---------------+---------+-----------+----------+--------------+ FV DistalFull                                                        +---------+---------------+---------+-----------+----------+--------------+ PFV      Full                                                        +---------+---------------+---------+-----------+----------+--------------+ POP      Full           Yes      Yes                                 +---------+---------------+---------+-----------+----------+--------------+ PTV      Full                                                         +---------+---------------+---------+-----------+----------+--------------+ PERO     Full                                         limited        +---------+---------------+---------+-----------+----------+--------------+   +----+---------------+---------+-----------+----------+--------------+  LEFTCompressibilityPhasicitySpontaneityPropertiesThrombus Aging +----+---------------+---------+-----------+----------+--------------+ CFV Full           Yes      Yes                                 +----+---------------+---------+-----------+----------+--------------+ SFJ Full                                                        +----+---------------+---------+-----------+----------+--------------+     Summary: RIGHT: - There is no evidence of deep vein thrombosis in the lower extremity. However, portions of this examination were limited- see technologist comments above.  - No cystic structure found in the popliteal fossa.  LEFT: - No evidence of common femoral vein obstruction.  *See table(s) above for measurements and observations. Electronically signed by Waverly Ferrari MD on 05/31/2023 at 7:18:26 AM.    Final    DG Shoulder Right  Result Date: 05/30/2023 CLINICAL DATA:  Pain EXAM: RIGHT SHOULDER - 3 VIEW COMPARISON:  None Available. FINDINGS: There is no evidence of fracture or dislocation. There is no evidence of arthropathy or other focal bone abnormality. Soft tissues are unremarkable. IMPRESSION: Negative. Electronically Signed   By: Allegra Lai M.D.   On: 05/30/2023 18:25   DG Knee Complete 4 Views Right  Result Date: 05/30/2023 CLINICAL DATA:  fall, pain EXAM: RIGHT KNEE - COMPLETE 4+ VIEW; RIGHT TIBIA AND FIBULA - 2 VIEW; RIGHT ANKLE - COMPLETE 3+ VIEW; RIGHT FOOT COMPLETE - 3+ VIEW COMPARISON:  None Available. FINDINGS: No acute fracture or dislocation. Joint spaces and alignment are maintained. No area of  erosion or osseous destruction. No unexpected radiopaque foreign body. Scattered soft tissue calcifications. Soft tissue edema. IMPRESSION: 1. No acute fracture or dislocation. 2. Soft tissue edema. Electronically Signed   By: Meda Klinefelter M.D.   On: 05/30/2023 17:03   DG Tibia/Fibula Right  Result Date: 05/30/2023 CLINICAL DATA:  fall, pain EXAM: RIGHT KNEE - COMPLETE 4+ VIEW; RIGHT TIBIA AND FIBULA - 2 VIEW; RIGHT ANKLE - COMPLETE 3+ VIEW; RIGHT FOOT COMPLETE - 3+ VIEW COMPARISON:  None Available. FINDINGS: No acute fracture or dislocation. Joint spaces and alignment are maintained. No area of erosion or osseous destruction. No unexpected radiopaque foreign body. Scattered soft tissue calcifications. Soft tissue edema. IMPRESSION: 1. No acute fracture or dislocation. 2. Soft tissue edema. Electronically Signed   By: Meda Klinefelter M.D.   On: 05/30/2023 17:03   DG Ankle Complete Right  Result Date: 05/30/2023 CLINICAL DATA:  fall, pain EXAM: RIGHT KNEE - COMPLETE 4+ VIEW; RIGHT TIBIA AND FIBULA - 2 VIEW; RIGHT ANKLE - COMPLETE 3+ VIEW; RIGHT FOOT COMPLETE - 3+ VIEW COMPARISON:  None Available. FINDINGS: No acute fracture or dislocation. Joint spaces and alignment are maintained. No area of erosion or osseous destruction. No unexpected radiopaque foreign body. Scattered soft tissue calcifications. Soft tissue edema. IMPRESSION: 1. No acute fracture or dislocation. 2. Soft tissue edema. Electronically Signed   By: Meda Klinefelter M.D.   On: 05/30/2023 17:03   DG Foot Complete Right  Result Date: 05/30/2023 CLINICAL DATA:  fall, pain EXAM: RIGHT KNEE - COMPLETE 4+ VIEW; RIGHT TIBIA AND FIBULA - 2 VIEW; RIGHT ANKLE - COMPLETE 3+ VIEW; RIGHT FOOT COMPLETE - 3+ VIEW COMPARISON:  None Available.  FINDINGS: No acute fracture or dislocation. Joint spaces and alignment are maintained. No area of erosion or osseous destruction. No unexpected radiopaque foreign body. Scattered soft tissue  calcifications. Soft tissue edema. IMPRESSION: 1. No acute fracture or dislocation. 2. Soft tissue edema. Electronically Signed   By: Meda Klinefelter M.D.   On: 05/30/2023 17:03   DG Forearm Right  Result Date: 05/30/2023 CLINICAL DATA:  fall, distal radius pain, 2nd metacarpal pain EXAM: RIGHT FOREARM - 2 VIEW; RIGHT HAND - COMPLETE 3+ VIEW COMPARISON:  February 19, 2005 FINDINGS: Status post amputation of the distal second phalanx. There is a small osseous fleck along the dorsum of the wrist consistent with a triquetral fracture. Soft tissue edema. No unexpected radiopaque foreign body. IMPRESSION: There is a small osseous fleck along the dorsum of the wrist consistent with a triquetral fracture. Electronically Signed   By: Meda Klinefelter M.D.   On: 05/30/2023 16:59   DG Hand Complete Right  Result Date: 05/30/2023 CLINICAL DATA:  fall, distal radius pain, 2nd metacarpal pain EXAM: RIGHT FOREARM - 2 VIEW; RIGHT HAND - COMPLETE 3+ VIEW COMPARISON:  February 19, 2005 FINDINGS: Status post amputation of the distal second phalanx. There is a small osseous fleck along the dorsum of the wrist consistent with a triquetral fracture. Soft tissue edema. No unexpected radiopaque foreign body. IMPRESSION: There is a small osseous fleck along the dorsum of the wrist consistent with a triquetral fracture. Electronically Signed   By: Meda Klinefelter M.D.   On: 05/30/2023 16:59    EKG: Independently reviewed.   Assessment and Plan: * Cellulitis of right leg Failed outpt Keflex. Aquatic injury (GN risk) freshwater contamination of wound.  Adding coverage for pseudomonas and aeromonas. Got cefepime / vanc in ED Cont Cefepime for the moment for GN water bourne organisms coverage (pseudomonas, aeromonas) Getting plain film X ray of R tib-fib to make sure no sub Q air, but pt has NO SIRS at this point, no pain beyond site of erythema, no crepitus, or other red flag findings on exam at the moment.  CKD (chronic  kidney disease), stage III (HCC) Creat 1.3 today, presumably at or near baseline given h/o CKD 3 in chart (any better at baseline and he wouldn't have CKD 3).  Hypertension with heart disease Cont home BP meds. Cont lasix, jardiance  Type 2 diabetes mellitus with diabetic neuropathy, unspecified (HCC) Cont Lantus 65u Daily Hold his 40u TID AC Use 6u TID AC + resistant scale SSI (may need to increase AC dosage).      Advance Care Planning:   Code Status: DNR as per DNR form in chart  Consults: None  Family Communication: No family in room  Severity of Illness: The appropriate patient status for this patient is OBSERVATION. Observation status is judged to be reasonable and necessary in order to provide the required intensity of service to ensure the patient's safety. The patient's presenting symptoms, physical exam findings, and initial radiographic and laboratory data in the context of their medical condition is felt to place them at decreased risk for further clinical deterioration. Furthermore, it is anticipated that the patient will be medically stable for discharge from the hospital within 2 midnights of admission.   Author: Hillary Bow., DO 06/01/2023 3:33 AM  For on call review [email or web address].

## 2023-06-01 NOTE — Assessment & Plan Note (Signed)
Failed outpt Keflex. Aquatic injury (GN risk) freshwater contamination of wound.  Adding coverage for pseudomonas and aeromonas. Got cefepime / vanc in ED Cont Cefepime for the moment for GN water bourne organisms coverage (pseudomonas, aeromonas) Getting plain film X ray of R tib-fib to make sure no sub Q air, but pt has NO SIRS at this point, no pain beyond site of erythema, no crepitus, or other red flag findings on exam at the moment.

## 2023-06-01 NOTE — ED Notes (Signed)
ED TO INPATIENT HANDOFF REPORT  ED Nurse Name and Phone #:  Mellody Dance  -  [phone]  S Name/Age/Gender Sean Saunders 77 y.o. male Room/Bed: WA10/WA10  Code Status   Code Status: DNR  Home/SNF/Other Home Patient oriented to: self, place, time, and situation Is this baseline? Yes   Triage Complete: Triage complete  Chief Complaint Cellulitis of right leg [L03.115]  Triage Note Right lower leg injury and was seen yesterday.   Says he had many XR's done and had a splint placed on right arm.   Several scabs on lower legs. After CAM boot placed on right leg and he took it off at home it took all the scab off and has had redness growing up around the leg.   Family has been marking redness changes and time stamping images.   Uncontrolled T2DM.    Allergies Allergies  Allergen Reactions   Eszopiclone Itching   Niaspan [Niacin Er] Nausea And Vomiting and Rash   Finasteride    Niacin Other (See Comments)   Other Hives and Nausea And Vomiting    Ozempic and     Level of Care/Admitting Diagnosis ED Disposition     ED Disposition  Admit   Condition  --   Comment  Hospital Area: [facility] [[id number]]  Level of Care: Med-Surg [16]  May place patient in observation at [facility] or [facility] if equivalent level of care is available:: No  Covid Evaluation: Asymptomatic - no recent exposure (last 10 days) testing not required  Diagnosis: Cellulitis of right leg [147829]  Admitting Physician: Hillary Bow [[id number]]  Attending Physician: Hillary Bow [[id number]]          B Medical/Surgery History Past Medical History:  Diagnosis Date   Arthritis    COPD (chronic obstructive pulmonary disease) (HCC)    Diabetes mellitus    DVT (deep venous thrombosis) (HCC)    GERD (gastroesophageal reflux disease)    Hyperlipidemia    Hypertension    Hypothyroidism    Onychomycosis 06/07/2013   Stroke (HCC)    TIA's 01/05/11-01/08/11-01/26/11   TIA (transient  ischemic attack) 08/03/2011   Left sided weakness. 3 episodes Feb-March   Past Surgical History:  Procedure Laterality Date   ABDOMINAL AORTAGRAM N/A 06/04/2012   Procedure: ABDOMINAL Ronny Flurry;  Surgeon: Chuck Hint, MD;  Location: [facility] CATH LAB;  Service: Cardiovascular;  Laterality: N/A;   APPENDECTOMY     COLONOSCOPY     FEMORAL-POPLITEAL BYPASS GRAFT  07/05/2012   Procedure: BYPASS GRAFT FEMORAL-POPLITEAL ARTERY;  Surgeon: Chuck Hint, MD;  Location: [facility] OR;  Service: Vascular;  Laterality: Left;  Revision Left fem/pop w/ artery patch angioplasty utilizing left GSV graft and extenion to tib/per trunk w/right GSV and interop arteriogram   JOINT REPLACEMENT  2011   -Hip   NASAL SINUS SURGERY     PR VEIN BYPASS GRAFT,AORTO-FEM-POP     PR VEIN BYPASS GRAFT,AORTO-FEM-POP  08-01-11   (L) Fem-pop BP   toe nail     TONSILLECTOMY       A IV Location/Drains/Wounds Patient Lines/Drains/Airways Status     Active Line/Drains/Airways     Name Placement date Placement time Site Days   Peripheral IV 06/01/23 20 G 1" Anterior;Distal;Left Forearm 06/01/23  0031  Forearm  less than 1            Intake/Output Last 24 hours No intake or output data in the 24 hours ending 06/01/23 3086  Labs/Imaging Results  for orders placed or performed during the hospital encounter of 05/31/23 (from the past 48 hour(s))  Comprehensive metabolic panel     Status: Abnormal   Collection Time: 06/01/23 12:19 AM  Result Value Ref Range   Sodium 138 135 - 145 mmol/L   Potassium 4.4 3.5 - 5.1 mmol/L   Chloride 108 98 - 111 mmol/L   CO2 23 22 - 32 mmol/L   Glucose, Bld 159 (H) 70 - 99 mg/dL    Comment: Glucose reference range applies only to samples taken after fasting for at least 8 hours.   BUN 27 (H) 8 - 23 mg/dL   Creatinine, Ser 1.61 (H) 0.61 - 1.24 mg/dL   Calcium 8.7 (L) 8.9 - 10.3 mg/dL   Total Protein 6.4 (L) 6.5 - 8.1 g/dL   Albumin 3.7 3.5 - 5.0 g/dL   AST 19 15 - 41 U/L   ALT  15 0 - 44 U/L   Alkaline Phosphatase 81 38 - 126 U/L   Total Bilirubin 0.6 0.3 - 1.2 mg/dL   GFR, Estimated 55 (L) >60 mL/min    Comment: (NOTE) Calculated using the CKD-EPI Creatinine Equation (2021)    Anion gap 7 5 - 15    Comment: Performed at [facility], [address] [place], [place] [address]  CBC with Differential     Status: Abnormal   Collection Time: 06/01/23 12:19 AM  Result Value Ref Range   WBC 8.4 4.0 - 10.5 K/uL   RBC 4.68 4.22 - 5.81 MIL/uL   Hemoglobin 13.3 13.0 - 17.0 g/dL   HCT 54.0 98.1 - 19.1 %   MCV 88.7 80.0 - 100.0 fL   MCH 28.4 26.0 - 34.0 pg   MCHC 32.0 30.0 - 36.0 g/dL   RDW 47.8 29.5 - 62.1 %   Platelets 174 150 - 400 K/uL   nRBC 0.0 0.0 - 0.2 %   Neutrophils Relative % 54 %   Neutro Abs 4.6 1.7 - 7.7 K/uL   Lymphocytes Relative 30 %   Lymphs Abs 2.5 0.7 - 4.0 K/uL   Monocytes Relative 10 %   Monocytes Absolute 0.8 0.1 - 1.0 K/uL   Eosinophils Relative 4 %   Eosinophils Absolute 0.3 0.0 - 0.5 K/uL   Basophils Relative 1 %   Basophils Absolute 0.1 0.0 - 0.1 K/uL   Immature Granulocytes 1 %   Abs Immature Granulocytes 0.09 (H) 0.00 - 0.07 K/uL    Comment: Performed at [facility], [address], [place], [place] [address]  Sedimentation rate     Status: None   Collection Time: 06/01/23 12:19 AM  Result Value Ref Range   Sed Rate 8 0 - 16 mm/hr    Comment: Performed at [facility], [address], [place], [place] [address]  C-reactive protein     Status: Abnormal   Collection Time: 06/01/23 12:19 AM  Result Value Ref Range   CRP 3.0 (H) <1.0 mg/dL    Comment: Performed at [facility], [address], [place], [place] [address]  MRSA Next Gen by PCR, Nasal     Status: None   Collection Time: 06/01/23  2:07 AM   Specimen: Nasal Mucosa; Nasal Swab  Result Value Ref Range   MRSA by PCR Next Gen NOT DETECTED NOT DETECTED    Comment: (NOTE) The GeneXpert MRSA Assay (FDA  approved for NASAL specimens only), is one component of a comprehensive MRSA colonization surveillance program.  It is not intended to diagnose MRSA infection nor to guide or monitor treatment for MRSA infections. Test performance is not FDA approved in patients less than 73 years old. Performed at [facility], [address], [place], [place] [address]   CBC     Status: Abnormal   Collection Time: 06/01/23  5:58 AM  Result Value Ref Range   WBC 6.6 4.0 - 10.5 K/uL   RBC 4.53 4.22 - 5.81 MIL/uL   Hemoglobin 13.0 13.0 - 17.0 g/dL   HCT 60.4 54.0 - 98.1 %   MCV 90.1 80.0 - 100.0 fL   MCH 28.7 26.0 - 34.0 pg   MCHC 31.9 30.0 - 36.0 g/dL   RDW 19.1 47.8 - 29.5 %   Platelets 144 (L) 150 - 400 K/uL   nRBC 0.0 0.0 - 0.2 %    Comment: Performed at [facility], [address], [place], [place] [address]  Basic metabolic panel     Status: Abnormal   Collection Time: 06/01/23  5:58 AM  Result Value Ref Range   Sodium 138 135 - 145 mmol/L   Potassium 3.9 3.5 - 5.1 mmol/L   Chloride 112 (H) 98 - 111 mmol/L   CO2 20 (L) 22 - 32 mmol/L   Glucose, Bld 132 (H) 70 - 99 mg/dL    Comment: Glucose reference range applies only to samples taken after fasting for at least 8 hours.   BUN 24 (H) 8 - 23 mg/dL   Creatinine, Ser 8.65 0.61 - 1.24 mg/dL   Calcium 7.3 (L) 8.9 - 10.3 mg/dL   GFR, Estimated >78 >46 mL/min    Comment: (NOTE) Calculated using the CKD-EPI Creatinine Equation (2021)    Anion gap 6 5 - 15    Comment: Performed at [facility], [address] [place], [place] [address]  CBG monitoring, ED     Status: Abnormal   Collection Time: 06/01/23  7:58 AM  Result Value Ref Range   Glucose-Capillary 138 (H) 70 - 99 mg/dL    Comment: Glucose reference range applies only to samples taken after fasting for at least 8 hours.   DG Tibia/Fibula Right  Result Date: 06/01/2023 CLINICAL DATA:  Cellulitis of right leg. EXAM: RIGHT TIBIA  AND FIBULA - 2 VIEW COMPARISON:  05/30/2023. FINDINGS: There is no evidence of fracture or other focal bone lesions. No bony erosion or periosteal elevation. Mild calcaneal spurring is noted. Soft tissues are unremarkable. IMPRESSION: No acute osseous abnormality. Electronically Signed   By: Thornell Sartorius M.D.   On: 06/01/2023 04:06   VAS [place] LOWER EXTREMITY VENOUS (DVT)  Result Date: 05/31/2023  Lower Venous DVT Study Patient Name:  Sean Saunders  Date of Exam:   05/30/2023 Medical Rec #: [id number]        Accession #:    [id number] Date of Birth: 10-10-1946        Patient Gender: M Patient Age:   38 years Exam Location:  [facility] Procedure:      VAS [place] LOWER EXTREMITY VENOUS (DVT) Referring Phys: Nehemiah Settle SMALL --------------------------------------------------------------------------------  Indications: Swelling.  Limitations: Body habitus. Comparison Study: Previous 05/01/21 negative Performing Technologist: McKayla Maag RVT, [place]  Examination Guidelines: A complete evaluation includes B-mode imaging, spectral Doppler, color Doppler, and power Doppler as needed of all accessible portions of each vessel. Bilateral testing is considered an integral part of a complete examination. Limited examinations for reoccurring indications may be performed as noted.  The reflux portion of the exam is performed with the patient in reverse Trendelenburg.  +---------+---------------+---------+-----------+----------+--------------+ RIGHT    CompressibilityPhasicitySpontaneityPropertiesThrombus Aging +---------+---------------+---------+-----------+----------+--------------+ CFV      Full           Yes      Yes                                 +---------+---------------+---------+-----------+----------+--------------+ SFJ      Full                                                        +---------+---------------+---------+-----------+----------+--------------+ FV Prox  Full                                                         +---------+---------------+---------+-----------+----------+--------------+ FV Mid   Full                                                        +---------+---------------+---------+-----------+----------+--------------+ FV DistalFull                                                        +---------+---------------+---------+-----------+----------+--------------+ PFV      Full                                                        +---------+---------------+---------+-----------+----------+--------------+ POP      Full           Yes      Yes                                 +---------+---------------+---------+-----------+----------+--------------+ PTV      Full                                                        +---------+---------------+---------+-----------+----------+--------------+ PERO     Full                                         limited        +---------+---------------+---------+-----------+----------+--------------+   +----+---------------+---------+-----------+----------+--------------+ LEFTCompressibilityPhasicitySpontaneityPropertiesThrombus Aging +----+---------------+---------+-----------+----------+--------------+ CFV Full           Yes      Yes                                 +----+---------------+---------+-----------+----------+--------------+  SFJ Full                                                        +----+---------------+---------+-----------+----------+--------------+     Summary: RIGHT: - There is no evidence of deep vein thrombosis in the lower extremity. However, portions of this examination were limited- see technologist comments above.  - No cystic structure found in the popliteal fossa.  LEFT: - No evidence of common femoral vein obstruction.  *See table(s) above for measurements and observations. Electronically signed by Waverly Ferrari MD on 05/31/2023 at 7:18:26 AM.    Final     DG Shoulder Right  Result Date: 05/30/2023 CLINICAL DATA:  Pain EXAM: RIGHT SHOULDER - 3 VIEW COMPARISON:  None Available. FINDINGS: There is no evidence of fracture or dislocation. There is no evidence of arthropathy or other focal bone abnormality. Soft tissues are unremarkable. IMPRESSION: Negative. Electronically Signed   By: Allegra Lai M.D.   On: 05/30/2023 18:25   DG Knee Complete 4 Views Right  Result Date: 05/30/2023 CLINICAL DATA:  fall, pain EXAM: RIGHT KNEE - COMPLETE 4+ VIEW; RIGHT TIBIA AND FIBULA - 2 VIEW; RIGHT ANKLE - COMPLETE 3+ VIEW; RIGHT FOOT COMPLETE - 3+ VIEW COMPARISON:  None Available. FINDINGS: No acute fracture or dislocation. Joint spaces and alignment are maintained. No area of erosion or osseous destruction. No unexpected radiopaque foreign body. Scattered soft tissue calcifications. Soft tissue edema. IMPRESSION: 1. No acute fracture or dislocation. 2. Soft tissue edema. Electronically Signed   By: Meda Klinefelter M.D.   On: 05/30/2023 17:03   DG Tibia/Fibula Right  Result Date: 05/30/2023 CLINICAL DATA:  fall, pain EXAM: RIGHT KNEE - COMPLETE 4+ VIEW; RIGHT TIBIA AND FIBULA - 2 VIEW; RIGHT ANKLE - COMPLETE 3+ VIEW; RIGHT FOOT COMPLETE - 3+ VIEW COMPARISON:  None Available. FINDINGS: No acute fracture or dislocation. Joint spaces and alignment are maintained. No area of erosion or osseous destruction. No unexpected radiopaque foreign body. Scattered soft tissue calcifications. Soft tissue edema. IMPRESSION: 1. No acute fracture or dislocation. 2. Soft tissue edema. Electronically Signed   By: Meda Klinefelter M.D.   On: 05/30/2023 17:03   DG Ankle Complete Right  Result Date: 05/30/2023 CLINICAL DATA:  fall, pain EXAM: RIGHT KNEE - COMPLETE 4+ VIEW; RIGHT TIBIA AND FIBULA - 2 VIEW; RIGHT ANKLE - COMPLETE 3+ VIEW; RIGHT FOOT COMPLETE - 3+ VIEW COMPARISON:  None Available. FINDINGS: No acute fracture or dislocation. Joint spaces and alignment are maintained.  No area of erosion or osseous destruction. No unexpected radiopaque foreign body. Scattered soft tissue calcifications. Soft tissue edema. IMPRESSION: 1. No acute fracture or dislocation. 2. Soft tissue edema. Electronically Signed   By: Meda Klinefelter M.D.   On: 05/30/2023 17:03   DG Foot Complete Right  Result Date: 05/30/2023 CLINICAL DATA:  fall, pain EXAM: RIGHT KNEE - COMPLETE 4+ VIEW; RIGHT TIBIA AND FIBULA - 2 VIEW; RIGHT ANKLE - COMPLETE 3+ VIEW; RIGHT FOOT COMPLETE - 3+ VIEW COMPARISON:  None Available. FINDINGS: No acute fracture or dislocation. Joint spaces and alignment are maintained. No area of erosion or osseous destruction. No unexpected radiopaque foreign body. Scattered soft tissue calcifications. Soft tissue edema. IMPRESSION: 1. No acute fracture or dislocation. 2. Soft tissue edema. Electronically Signed   By: Meda Klinefelter M.D.   On:  05/30/2023 17:03   DG Forearm Right  Result Date: 05/30/2023 CLINICAL DATA:  fall, distal radius pain, 2nd metacarpal pain EXAM: RIGHT FOREARM - 2 VIEW; RIGHT HAND - COMPLETE 3+ VIEW COMPARISON:  February 19, 2005 FINDINGS: Status post amputation of the distal second phalanx. There is a small osseous fleck along the dorsum of the wrist consistent with a triquetral fracture. Soft tissue edema. No unexpected radiopaque foreign body. IMPRESSION: There is a small osseous fleck along the dorsum of the wrist consistent with a triquetral fracture. Electronically Signed   By: Meda Klinefelter M.D.   On: 05/30/2023 16:59   DG Hand Complete Right  Result Date: 05/30/2023 CLINICAL DATA:  fall, distal radius pain, 2nd metacarpal pain EXAM: RIGHT FOREARM - 2 VIEW; RIGHT HAND - COMPLETE 3+ VIEW COMPARISON:  February 19, 2005 FINDINGS: Status post amputation of the distal second phalanx. There is a small osseous fleck along the dorsum of the wrist consistent with a triquetral fracture. Soft tissue edema. No unexpected radiopaque foreign body. IMPRESSION: There  is a small osseous fleck along the dorsum of the wrist consistent with a triquetral fracture. Electronically Signed   By: Meda Klinefelter M.D.   On: 05/30/2023 16:59    Pending Labs Unresulted Labs (From admission, onward)     Start     Ordered   06/01/23 0321  Hemoglobin A1c  Once,   URGENT       Comments: To assess prior glycemic control    06/01/23 0320            Vitals/Pain Today's Vitals   06/01/23 0332 06/01/23 0400 06/01/23 0600 06/01/23 0730  BP:  (!) 148/67 (!) 154/65   Pulse:  67 66   Resp:   18   Temp: 98 F (36.7 C)   97.8 F (36.6 C)  TempSrc: Oral   Oral  SpO2:  96% 95%   Weight:      Height:      PainSc:        Isolation Precautions No active isolations  Medications Medications  ceFEPIme (MAXIPIME) 2 g in sodium chloride 0.9 % 100 mL IVPB (has no administration in time range)  amLODipine (NORVASC) tablet 10 mg (has no administration in time range)  benazepril (LOTENSIN) tablet 40 mg (has no administration in time range)  carvedilol (COREG) tablet 12.5 mg (has no administration in time range)  allopurinol (ZYLOPRIM) tablet 100 mg (has no administration in time range)  clopidogrel (PLAVIX) tablet 75 mg (has no administration in time range)  empagliflozin (JARDIANCE) tablet 25 mg (has no administration in time range)  furosemide (LASIX) tablet 20 mg (has no administration in time range)  insulin glargine-yfgn (SEMGLEE) injection 65 Units (has no administration in time range)  oxyCODONE-acetaminophen (PERCOCET/ROXICET) 5-325 MG per tablet 1 tablet (has no administration in time range)  rosuvastatin (CRESTOR) tablet 20 mg (has no administration in time range)  rOPINIRole (REQUIP) tablet 0.25 mg (has no administration in time range)  tamsulosin (FLOMAX) capsule 0.4 mg (has no administration in time range)  levothyroxine (SYNTHROID) tablet 125 mcg (125 mcg Oral Given 06/01/23 0526)  metFORMIN (GLUCOPHAGE) tablet 500 mg (has no administration in time  range)  montelukast (SINGULAIR) tablet 10 mg (has no administration in time range)  latanoprost (XALATAN) 0.005 % ophthalmic solution 1 drop (has no administration in time range)  ipratropium-albuterol (DUONEB) 0.5-2.5 (3) MG/3ML nebulizer solution 3 mL (has no administration in time range)  fluticasone (FLONASE) 50 MCG/ACT nasal spray 2 spray (has no  administration in time range)  insulin aspart (novoLOG) injection 6 Units (has no administration in time range)  insulin aspart (novoLOG) injection 0-20 Units (has no administration in time range)  enoxaparin (LOVENOX) injection 40 mg (has no administration in time range)  acetaminophen (TYLENOL) tablet 650 mg (has no administration in time range)    Or  acetaminophen (TYLENOL) suppository 650 mg (has no administration in time range)  ondansetron (ZOFRAN) tablet 4 mg (has no administration in time range)    Or  ondansetron (ZOFRAN) injection 4 mg (has no administration in time range)  ketorolac (TORADOL) 15 MG/ML injection 15 mg (15 mg Intravenous Given 06/01/23 0116)  HYDROcodone-acetaminophen (NORCO/VICODIN) 5-325 MG per tablet 1 tablet (1 tablet Oral Given 06/01/23 0116)  vancomycin (VANCOREADY) IVPB 2000 mg/400 mL (0 mg Intravenous Stopped 06/01/23 0450)  ceFEPIme (MAXIPIME) 2 g in sodium chloride 0.9 % 100 mL IVPB (0 g Intravenous Stopped 06/01/23 0225)    Mobility walks with device     Focused Assessments    R Recommendations: See Admitting Provider Note  Report given to:   Additional Notes:

## 2023-06-02 DIAGNOSIS — J9611 Chronic respiratory failure with hypoxia: Secondary | ICD-10-CM | POA: Diagnosis present

## 2023-06-02 DIAGNOSIS — Z79890 Hormone replacement therapy (postmenopausal): Secondary | ICD-10-CM | POA: Diagnosis not present

## 2023-06-02 DIAGNOSIS — N1831 Chronic kidney disease, stage 3a: Secondary | ICD-10-CM | POA: Diagnosis present

## 2023-06-02 DIAGNOSIS — E1151 Type 2 diabetes mellitus with diabetic peripheral angiopathy without gangrene: Secondary | ICD-10-CM | POA: Diagnosis present

## 2023-06-02 DIAGNOSIS — W174XXA Fall from dock, initial encounter: Secondary | ICD-10-CM | POA: Diagnosis not present

## 2023-06-02 DIAGNOSIS — Z79899 Other long term (current) drug therapy: Secondary | ICD-10-CM | POA: Diagnosis not present

## 2023-06-02 DIAGNOSIS — Z9981 Dependence on supplemental oxygen: Secondary | ICD-10-CM | POA: Diagnosis not present

## 2023-06-02 DIAGNOSIS — N400 Enlarged prostate without lower urinary tract symptoms: Secondary | ICD-10-CM | POA: Diagnosis present

## 2023-06-02 DIAGNOSIS — Z823 Family history of stroke: Secondary | ICD-10-CM | POA: Diagnosis not present

## 2023-06-02 DIAGNOSIS — S62111A Displaced fracture of triquetrum [cuneiform] bone, right wrist, initial encounter for closed fracture: Secondary | ICD-10-CM | POA: Diagnosis present

## 2023-06-02 DIAGNOSIS — I69354 Hemiplegia and hemiparesis following cerebral infarction affecting left non-dominant side: Secondary | ICD-10-CM | POA: Diagnosis not present

## 2023-06-02 DIAGNOSIS — Z7984 Long term (current) use of oral hypoglycemic drugs: Secondary | ICD-10-CM | POA: Diagnosis not present

## 2023-06-02 DIAGNOSIS — G2581 Restless legs syndrome: Secondary | ICD-10-CM | POA: Diagnosis present

## 2023-06-02 DIAGNOSIS — E039 Hypothyroidism, unspecified: Secondary | ICD-10-CM | POA: Diagnosis present

## 2023-06-02 DIAGNOSIS — E1140 Type 2 diabetes mellitus with diabetic neuropathy, unspecified: Secondary | ICD-10-CM | POA: Diagnosis present

## 2023-06-02 DIAGNOSIS — E785 Hyperlipidemia, unspecified: Secondary | ICD-10-CM | POA: Diagnosis present

## 2023-06-02 DIAGNOSIS — Z794 Long term (current) use of insulin: Secondary | ICD-10-CM | POA: Diagnosis not present

## 2023-06-02 DIAGNOSIS — Z833 Family history of diabetes mellitus: Secondary | ICD-10-CM | POA: Diagnosis not present

## 2023-06-02 DIAGNOSIS — Y92838 Other recreation area as the place of occurrence of the external cause: Secondary | ICD-10-CM | POA: Diagnosis not present

## 2023-06-02 DIAGNOSIS — E1122 Type 2 diabetes mellitus with diabetic chronic kidney disease: Secondary | ICD-10-CM | POA: Diagnosis present

## 2023-06-02 DIAGNOSIS — Z87891 Personal history of nicotine dependence: Secondary | ICD-10-CM | POA: Diagnosis not present

## 2023-06-02 DIAGNOSIS — Z7902 Long term (current) use of antithrombotics/antiplatelets: Secondary | ICD-10-CM | POA: Diagnosis not present

## 2023-06-02 DIAGNOSIS — L03115 Cellulitis of right lower limb: Secondary | ICD-10-CM | POA: Diagnosis present

## 2023-06-02 DIAGNOSIS — J449 Chronic obstructive pulmonary disease, unspecified: Secondary | ICD-10-CM | POA: Diagnosis present

## 2023-06-02 DIAGNOSIS — I129 Hypertensive chronic kidney disease with stage 1 through stage 4 chronic kidney disease, or unspecified chronic kidney disease: Secondary | ICD-10-CM | POA: Diagnosis present

## 2023-06-02 DIAGNOSIS — Z66 Do not resuscitate: Secondary | ICD-10-CM | POA: Diagnosis present

## 2023-06-02 LAB — BASIC METABOLIC PANEL
Anion gap: 6 (ref 5–15)
BUN: 30 mg/dL — ABNORMAL HIGH (ref 8–23)
CO2: 23 mmol/L (ref 22–32)
Calcium: 8.4 mg/dL — ABNORMAL LOW (ref 8.9–10.3)
Chloride: 110 mmol/L (ref 98–111)
Creatinine, Ser: 1.29 mg/dL — ABNORMAL HIGH (ref 0.61–1.24)
GFR, Estimated: 57 mL/min — ABNORMAL LOW (ref >60)
Glucose, Bld: 126 mg/dL — ABNORMAL HIGH (ref 70–99)
Potassium: 4.4 mmol/L (ref 3.5–5.1)
Sodium: 139 mmol/L (ref 135–145)

## 2023-06-02 LAB — CBC
HCT: 39.4 % (ref 39.0–52.0)
Hemoglobin: 12.8 g/dL — ABNORMAL LOW (ref 13.0–17.0)
MCH: 28.7 pg (ref 26.0–34.0)
MCHC: 32.5 g/dL (ref 30.0–36.0)
MCV: 88.3 fL (ref 80.0–100.0)
Platelets: 157 10*3/uL (ref 150–400)
RBC: 4.46 MIL/uL (ref 4.22–5.81)
RDW: 15.2 % (ref 11.5–15.5)
WBC: 7.1 10*3/uL (ref 4.0–10.5)
nRBC: 0 % (ref 0.0–0.2)

## 2023-06-02 LAB — GLUCOSE, CAPILLARY
Glucose-Capillary: 127 mg/dL — ABNORMAL HIGH (ref 70–99)
Glucose-Capillary: 192 mg/dL — ABNORMAL HIGH (ref 70–99)
Glucose-Capillary: 134 mg/dL — ABNORMAL HIGH (ref 70–99)
Glucose-Capillary: 186 mg/dL — ABNORMAL HIGH (ref 70–99)

## 2023-06-02 NOTE — Progress Notes (Signed)
Mobility Specialist - Progress Note   06/02/23 1431  Mobility  Activity Ambulated with assistance in hallway  Level of Assistance Standby assist, set-up cues, supervision of patient - no hands on  Assistive Device Front wheel walker  Distance Ambulated (ft) 250 ft  Activity Response Tolerated well  Mobility Referral Yes  $Mobility charge 1 Mobility  Mobility Specialist Start Time (ACUTE ONLY) 0213  Mobility Specialist Stop Time (ACUTE ONLY) 0228  Mobility Specialist Time Calculation (min) (ACUTE ONLY) 15 min   Pt received in bed and agreeable to mobility. C/o burning in leg. Nurse notified. No other complaints during session. Pt to recliner after session with all needs met.    [facility]

## 2023-06-02 NOTE — Progress Notes (Signed)
PROGRESS NOTE    Sean Saunders  EXB:[id number] DOB: 1946-12-06 DOA: 05/31/2023 PCP: [facility]    Brief Narrative:   Sean Saunders is a 77 y.o. male with past medical history significant for type 2 diabetes mellitus, essential hypertension, PAD, hypothyroidism, RLS, history of TIA who presented to [facility] ED on 6/19 due to progressive redness, pain to his right lower extremity.  Patient reports he fell off a dock at [facility] on 616 sustaining injury to his right shin, right knee and left ankle.  Patient started developing erythema and swelling and was seen in the ED on 6/18 and diagnosed with a right wrist fracture and started on Keflex for cellulitis.  He received a tetanus shot in the ED during that visit.  Despite treatment with Keflex, cellulitis has rapidly progressed in which he sought further care once again in the ED.  In the ED, temperature 98.3 F, HR 80, RR 17, BP 156/74, SpO2 95% on room air.  WBC 8.4, hemoglobin 13.3, platelets 174.  Sodium 138, potassium 4.4, chloride 108, CO2 23, glucose 159, BUN 27, creatinine 1.34.  AST 19, ALT 15, total bilirubin 0.6.  CRP 3.0.  Right tibia/fibula x-ray with no acute osseous abnormality, soft tissues are unremarkable.  Patient was started on vancomycin and cefepime.  TRH consulted for admission for further evaluation management of progressive cellulitis poorly responsive to outpatient antibiotics.  Assessment & Plan:   Right lower extremity cellulitis Patient presenting with progressive erythema, tenderness to right lower extremity despite outpatient antibiotic use with Keflex.  Although Keflex dose was likely not sufficient.  [place]  Received tetanus shot on 6/18.  Patient is afebrile without leukocytosis.  X-ray right tibia/fibula with no acute findings. -- Continue cefepime (consider transition to Levaquin once improved to complete 10-day course) --  Wound Care: Cleanse wounds (right knee, right lower leg and left ankle) with NS and pat dry. Apply medihoney to wound bed. Cover with foam dressing. Peel back and replace medihoney daily Replace foam every three days.   Right triquetral fracture Right hand x-ray from 6/18 notable for small osseous fleck along the dorsum of the wrist consistent with a triquetral fracture. -- Continue splint -- Outpatient follow-up with PCP/orthopedics  Type 2 diabetes mellitus Hemoglobin A1c 7.0, well-controlled.  Home regimen includes Jardiance, Lantus 65 units subcutaneously daily, NovoLog 40 units 3 times daily AC, metformin 5 mg p.o. twice daily. -- Semglee 65u [place] daily -- NovoLog 6 u TIDAC -- SSI for coverage -- CBGs qAC/HS  Essential hypertension -- Amlodipine 10 mg p.o. daily -- Carvedilol 12.5 mg p.o. twice daily -- Benazepril 40 mg p.o. daily -- Furosemide 20 mg p.o. daily -- Continue statin and Plavix  Hyperlipidemia: -- Crestor 20 mg p.o. daily  COPD Follows with  pulmonology outpatient; Dr. Delton Coombes.  Previously oxygen dependent which now has been weaned off.  Most recent visit 04/2023, apparently on Trelegy inhaler and longtime azithromycin use. -- Singulair 10 mg p.o. nightly -- DuoNebs every 6 hours as needed wheezing/shortness of breath  PAD -- Continue Plavix 75 mg p.o. daily  Restless leg syndrome -- Requip 0.25 mg p.o. nightly  BPH -- Tamsulosin 0.4 mg p.o. twice daily    DVT prophylaxis: enoxaparin (LOVENOX) injection 40 mg Start: 06/01/23 1000    Code Status: DNR Family Communication: No family present at bedside this morning  Disposition Plan:  Level of care: Med-Surg Status is: Inpatient  Remains inpatient appropriate because: IV antibiotics    Consultants:  None  Procedures:  None  Antimicrobials:  Vancomycin 6/19 - 6/19 Cefepime 6/19>>   Subjective: Patient seen examined bedside, resting comfortably.  Lying in bed.  No family present.  Remains  on IV antibiotics.  Complaining of pain to right leg especially with when applies pressure.  Patient with no other questions, concerns or complaints at this time; other than it is his birthday tomorrow and he is going to be missing out on some fun activities.  Patient denies headache, no dizziness, no chest pain, no palpitations, no shortness of breath, no abdominal pain, no fever/chills/night sweats, no nausea/vomiting/diarrhea, no focal weakness, no fatigue, no paresthesias.  No acute events overnight per nursing staff.  Objective: Vitals:   06/01/23 2147 06/02/23 0451 06/02/23 0807 06/02/23 0810  BP: (!) 137/93 (!) 143/68    Pulse: 77 80    Resp: 14 18    Temp: 98.7 F (37.1 C) 97.7 F (36.5 C)    TempSrc: Oral     SpO2: 99% 98% 98% 98%  Weight:      Height:        Intake/Output Summary (Last 24 hours) at 06/02/2023 1117 Last data filed at 06/02/2023 1000 Gross per 24 hour  Intake 1400.48 ml  Output 2350 ml  Net -949.52 ml   Filed Weights   05/31/23 2327  Weight: 97.5 kg    Examination:  Physical Exam: GEN: NAD, alert and oriented x 3, wd/wn HEENT: NCAT, PERRL, EOMI, sclera clear, MMM PULM: CTAB w/o wheezes/crackles, normal respiratory effort, on room air CV: RRR w/o M/G/R GI: abd soft, NTND, NABS, no R/G/M MSK: no peripheral edema, muscle strength globally intact 5/5 bilateral upper/lower extremities NEURO: CN II-XII intact, no focal deficits, sensation to light touch intact PSYCH: normal mood/affect Integumentary: Right lower extremity anterior shin with erythema, TTP without fluctuance/purulent drainage, noted right knee abrasion, left medial ankle lesion, otherwise no other concerning rashes/lesions/wounds noted on exposed skin surfaces.   6/20:   6/20:   6/21:   Data Reviewed: I have personally reviewed following labs and imaging studies  CBC: Recent Labs  Lab 06/01/23 0019 06/01/23 0558 06/02/23 0503  WBC 8.4 6.6 7.1  NEUTROABS 4.6  --   --   HGB  13.3 13.0 12.8*  HCT 41.5 40.8 39.4  MCV 88.7 90.1 88.3  PLT 174 144* 157   Basic Metabolic Panel: Recent Labs  Lab 06/01/23 0019 06/01/23 0558 06/02/23 0503  NA 138 138 139  K 4.4 3.9 4.4  CL 108 112* 110  CO2 23 20* 23  GLUCOSE 159* 132* 126*  BUN 27* 24* 30*  CREATININE 1.34* 1.13 1.29*  CALCIUM 8.7* 7.3* 8.4*   GFR: Estimated Creatinine Clearance: 57.1 mL/min (A) (by C-G formula based on SCr of 1.29 mg/dL (H)). Liver Function Tests: Recent Labs  Lab 06/01/23 0019  AST 19  ALT 15  ALKPHOS 81  BILITOT 0.6  PROT 6.4*  ALBUMIN 3.7   No results for input(s): "LIPASE", "AMYLASE" in the last 168 hours. No results for input(s): "AMMONIA" in the last 168 hours. Coagulation Profile: No results for input(s): "INR", "PROTIME" in the last 168 hours. Cardiac Enzymes: No results for input(s): "CKTOTAL", "CKMB", "CKMBINDEX", "TROPONINI" in the last 168 hours. BNP (last 3 results) No results for input(s): "PROBNP" in the last 8760 hours. HbA1C: Recent Labs    06/01/23 0559  HGBA1C 7.0*   CBG: Recent Labs  Lab 06/01/23 0758 06/01/23 1145  06/01/23 1634 06/01/23 2211 06/02/23 0753  GLUCAP 138* 183* 163* 194* 127*   Lipid Profile: No results for input(s): "CHOL", "HDL", "LDLCALC", "TRIG", "CHOLHDL", "LDLDIRECT" in the last 72 hours. Thyroid Function Tests: No results for input(s): "TSH", "T4TOTAL", "FREET4", "T3FREE", "THYROIDAB" in the last 72 hours. Anemia Panel: No results for input(s): "VITAMINB12", "FOLATE", "FERRITIN", "TIBC", "IRON", "RETICCTPCT" in the last 72 hours. Sepsis Labs: No results for input(s): "PROCALCITON", "LATICACIDVEN" in the last 168 hours.  Recent Results (from the past 240 hour(s))  MRSA Next Gen by PCR, Nasal     Status: None   Collection Time: 06/01/23  2:07 AM   Specimen: Nasal Mucosa; Nasal Swab  Result Value Ref Range Status   MRSA by PCR Next Gen NOT DETECTED NOT DETECTED Final    Comment: (NOTE) The GeneXpert MRSA Assay (FDA  approved for NASAL specimens only), is one component of a comprehensive MRSA colonization surveillance program. It is not intended to diagnose MRSA infection nor to guide or monitor treatment for MRSA infections. Test performance is not FDA approved in patients less than 60 years old. Performed at [facility], [address] [place], [place] [address]          Radiology Studies: DG Tibia/Fibula Right  Result Date: 06/01/2023 CLINICAL DATA:  Cellulitis of right leg. EXAM: RIGHT TIBIA AND FIBULA - 2 VIEW COMPARISON:  05/30/2023. FINDINGS: There is no evidence of fracture or other focal bone lesions. No bony erosion or periosteal elevation. Mild calcaneal spurring is noted. Soft tissues are unremarkable. IMPRESSION: No acute osseous abnormality. Electronically Signed   By: Thornell Sartorius M.D.   On: 06/01/2023 04:06        Scheduled Meds:  allopurinol  100 mg Oral BID   amLODipine  10 mg Oral Daily   azithromycin  250 mg Oral Daily   benazepril  40 mg Oral Daily   carvedilol  12.5 mg Oral BID WC   clopidogrel  75 mg Oral Daily   empagliflozin  25 mg Oral Daily   enoxaparin (LOVENOX) injection  40 mg Subcutaneous Q24H   fluticasone  2 spray Each Nare Daily   fluticasone furoate-vilanterol  1 puff Inhalation Daily   And   umeclidinium bromide  1 puff Inhalation Daily   furosemide  20 mg Oral Daily   insulin aspart  0-20 Units Subcutaneous TID WC   insulin aspart  6 Units Subcutaneous TID WC   insulin glargine-yfgn  65 Units Subcutaneous Daily   latanoprost  1 drop Both Eyes QHS   leptospermum manuka honey  1 Application Topical Daily   levothyroxine  125 mcg Oral Q0600   metFORMIN  500 mg Oral BID WC   montelukast  10 mg Oral QHS   rOPINIRole  0.25 mg Oral QHS   rosuvastatin  20 mg Oral Daily   tamsulosin  0.4 mg Oral BID   Continuous Infusions:  ceFEPime (MAXIPIME) IV 2 g (06/02/23 0139)     LOS: 0 days    Time spent: 52 minutes spent on chart  review, discussion with nursing staff, consultants, updating family and interview/physical exam; more than 50% of that time was spent in counseling and/or coordination of care.    Alvira Philips [place], DO [facility] Available via Epic secure chat 7am-7pm After these hours, please refer to coverage provider listed on [email or web address] 06/02/2023, 11:17 AM

## 2023-06-03 DIAGNOSIS — L03115 Cellulitis of right lower limb: Secondary | ICD-10-CM | POA: Diagnosis not present

## 2023-06-03 LAB — GLUCOSE, CAPILLARY
Glucose-Capillary: 99 mg/dL (ref 70–99)
Glucose-Capillary: 167 mg/dL — ABNORMAL HIGH (ref 70–99)
Glucose-Capillary: 178 mg/dL — ABNORMAL HIGH (ref 70–99)
Glucose-Capillary: 259 mg/dL — ABNORMAL HIGH (ref 70–99)

## 2023-06-03 NOTE — Progress Notes (Signed)
PROGRESS NOTE    Sean Saunders  ZOX:[id number] DOB: 05/18/1946 DOA: 05/31/2023 PCP: [facility]    Brief Narrative:   Sean Saunders is a 77 y.o. male with past medical history significant for type 2 diabetes mellitus, essential hypertension, PAD, hypothyroidism, RLS, history of TIA who presented to [facility] ED on 6/19 due to progressive redness, pain to his right lower extremity.  Patient reports he fell off a dock at [facility] on 616 sustaining injury to his right shin, right knee and left ankle.  Patient started developing erythema and swelling and was seen in the ED on 6/18 and diagnosed with a right wrist fracture and started on Keflex for cellulitis.  He received a tetanus shot in the ED during that visit.  Despite treatment with Keflex, cellulitis has rapidly progressed in which he sought further care once again in the ED.  In the ED, temperature 98.3 F, HR 80, RR 17, BP 156/74, SpO2 95% on room air.  WBC 8.4, hemoglobin 13.3, platelets 174.  Sodium 138, potassium 4.4, chloride 108, CO2 23, glucose 159, BUN 27, creatinine 1.34.  AST 19, ALT 15, total bilirubin 0.6.  CRP 3.0.  Right tibia/fibula x-ray with no acute osseous abnormality, soft tissues are unremarkable.  Patient was started on vancomycin and cefepime.  TRH consulted for admission for further evaluation management of progressive cellulitis poorly responsive to outpatient antibiotics.  Assessment & Plan:   Right lower extremity cellulitis Patient presenting with progressive erythema, tenderness to right lower extremity despite outpatient antibiotic use with Keflex.  Although Keflex dose was likely not sufficient.  [place]  Received tetanus shot on 6/18.  Patient is afebrile without leukocytosis.  X-ray right tibia/fibula with no acute findings. -- Continue cefepime (consider transition to Levaquin once improved to complete 10-day course) --  Wound Care: Cleanse wounds (right knee, right lower leg and left ankle) with NS and pat dry. Apply medihoney to wound bed. Cover with foam dressing. Peel back and replace medihoney daily Replace foam every three days.   Right triquetral fracture Right hand x-ray from 6/18 notable for small osseous fleck along the dorsum of the wrist consistent with a triquetral fracture. -- Continue splint -- Outpatient follow-up with PCP/orthopedics  Type 2 diabetes mellitus Hemoglobin A1c 7.0, well-controlled.  Home regimen includes Jardiance, Lantus 65 units subcutaneously daily, NovoLog 40 units 3 times daily AC, metformin 5 mg p.o. twice daily. -- Semglee 65u [place] daily -- NovoLog 6 u TIDAC -- SSI for coverage -- CBGs qAC/HS  Essential hypertension -- Amlodipine 10 mg p.o. daily -- Carvedilol 12.5 mg p.o. twice daily -- Benazepril 40 mg p.o. daily -- Furosemide 20 mg p.o. daily -- Continue statin and Plavix  Hyperlipidemia: -- Crestor 20 mg p.o. daily  COPD Follows with [facility] pulmonology outpatient; Dr. Delton Coombes.  Previously oxygen dependent which now has been weaned off.  Most recent visit 04/2023, apparently on Trelegy inhaler and longtime azithromycin use. -- Singulair 10 mg p.o. nightly -- DuoNebs every 6 hours as needed wheezing/shortness of breath  PAD -- Continue Plavix 75 mg p.o. daily  Restless leg syndrome -- Requip 0.25 mg p.o. nightly  BPH -- Tamsulosin 0.4 mg p.o. twice daily    DVT prophylaxis: enoxaparin (LOVENOX) injection 40 mg Start: 06/01/23 1000    Code Status: DNR Family Communication: No family present at bedside this morning  Disposition Plan:  Level of care: Med-Surg Status is: Inpatient  Remains inpatient appropriate because: IV antibiotics    Consultants:  None  Procedures:  None  Antimicrobials:  Vancomycin 6/19 - 6/19 Cefepime 6/19>>   Subjective: Patient seen examined bedside, resting comfortably.  Lying in bed.  No family present.  Remains  on IV antibiotics.  Complaining of pain to his right leg while ambulating.  Upset about lack of progress and that he is not able to participate in his birthday activities.   Patient denies headache, no dizziness, no chest pain, no palpitations, no shortness of breath, no abdominal pain, no fever/chills/night sweats, no nausea/vomiting/diarrhea, no focal weakness, no fatigue, no paresthesias.  No acute events overnight per nursing staff.  Objective: Vitals:   06/02/23 2214 06/03/23 0606 06/03/23 0744 06/03/23 0829  BP: (!) 153/63 (!) 143/62    Pulse: 72 (!) 58 64   Resp: 16 18    Temp: 98.6 F (37 C) 97.7 F (36.5 C)    TempSrc: Oral Oral    SpO2: 93% 96%  92%  Weight:      Height:        Intake/Output Summary (Last 24 hours) at 06/03/2023 1234 Last data filed at 06/03/2023 1000 Gross per 24 hour  Intake 1280 ml  Output 1850 ml  Net -570 ml   Filed Weights   05/31/23 2327  Weight: 97.5 kg    Examination:  Physical Exam: GEN: NAD, alert and oriented x 3, wd/wn HEENT: NCAT, PERRL, EOMI, sclera clear, MMM PULM: CTAB w/o wheezes/crackles, normal respiratory effort, on room air CV: RRR w/o M/G/R GI: abd soft, NTND, NABS, no R/G/M MSK: no peripheral edema, muscle strength globally intact 5/5 bilateral upper/lower extremities NEURO: CN II-XII intact, no focal deficits, sensation to light touch intact PSYCH: normal mood/affect Integumentary: Right lower extremity anterior shin with erythema, TTP without fluctuance/purulent drainage, noted right knee abrasion, left medial ankle lesion, otherwise no other concerning rashes/lesions/wounds noted on exposed skin surfaces.   6/20:   6/20:   6/21:   6/22:  Data Reviewed: I have personally reviewed following labs and imaging studies  CBC: Recent Labs  Lab 06/01/23 0019 06/01/23 0558 06/02/23 0503  WBC 8.4 6.6 7.1  NEUTROABS 4.6  --   --   HGB 13.3 13.0 12.8*  HCT 41.5 40.8 39.4  MCV 88.7 90.1 88.3  PLT 174 144* 157    Basic Metabolic Panel: Recent Labs  Lab 06/01/23 0019 06/01/23 0558 06/02/23 0503  NA 138 138 139  K 4.4 3.9 4.4  CL 108 112* 110  CO2 23 20* 23  GLUCOSE 159* 132* 126*  BUN 27* 24* 30*  CREATININE 1.34* 1.13 1.29*  CALCIUM 8.7* 7.3* 8.4*   GFR: Estimated Creatinine Clearance: 57.1 mL/min (A) (by C-G formula based on SCr of 1.29 mg/dL (H)). Liver Function Tests: Recent Labs  Lab 06/01/23 0019  AST 19  ALT 15  ALKPHOS 81  BILITOT 0.6  PROT 6.4*  ALBUMIN 3.7   No results for input(s): "LIPASE", "AMYLASE" in the last 168 hours. No results for input(s): "AMMONIA" in the last 168 hours. Coagulation Profile: No results for input(s): "INR", "PROTIME" in the last 168 hours. Cardiac Enzymes: No results for input(s): "CKTOTAL", "CKMB", "CKMBINDEX", "TROPONINI" in the last 168 hours. BNP (last 3 results) No results for input(s): "PROBNP" in the last 8760 hours. HbA1C: Recent Labs    06/01/23 0559  HGBA1C 7.0*   CBG: Recent Labs  Lab 06/02/23 1144 06/02/23 1621 06/02/23 2217 06/03/23 0718 06/03/23 1123  GLUCAP 192* 134* 186*  99 167*   Lipid Profile: No results for input(s): "CHOL", "HDL", "LDLCALC", "TRIG", "CHOLHDL", "LDLDIRECT" in the last 72 hours. Thyroid Function Tests: No results for input(s): "TSH", "T4TOTAL", "FREET4", "T3FREE", "THYROIDAB" in the last 72 hours. Anemia Panel: No results for input(s): "VITAMINB12", "FOLATE", "FERRITIN", "TIBC", "IRON", "RETICCTPCT" in the last 72 hours. Sepsis Labs: No results for input(s): "PROCALCITON", "LATICACIDVEN" in the last 168 hours.  Recent Results (from the past 240 hour(s))  MRSA Next Gen by PCR, Nasal     Status: None   Collection Time: 06/01/23  2:07 AM   Specimen: Nasal Mucosa; Nasal Swab  Result Value Ref Range Status   MRSA by PCR Next Gen NOT DETECTED NOT DETECTED Final    Comment: (NOTE) The GeneXpert MRSA Assay (FDA approved for NASAL specimens only), is one component of a comprehensive MRSA  colonization surveillance program. It is not intended to diagnose MRSA infection nor to guide or monitor treatment for MRSA infections. Test performance is not FDA approved in patients less than 77 years old. Performed at [facility], [address], [place], [place] [address]          Radiology Studies: No results found.      Scheduled Meds:  allopurinol  100 mg Oral BID   amLODipine  10 mg Oral Daily   azithromycin  250 mg Oral Daily   benazepril  40 mg Oral Daily   carvedilol  12.5 mg Oral BID WC   clopidogrel  75 mg Oral Daily   empagliflozin  25 mg Oral Daily   enoxaparin (LOVENOX) injection  40 mg Subcutaneous Q24H   fluticasone  2 spray Each Nare Daily   fluticasone furoate-vilanterol  1 puff Inhalation Daily   And   umeclidinium bromide  1 puff Inhalation Daily   furosemide  20 mg Oral Daily   insulin aspart  0-20 Units Subcutaneous TID WC   insulin aspart  6 Units Subcutaneous TID WC   insulin glargine-yfgn  65 Units Subcutaneous Daily   latanoprost  1 drop Both Eyes QHS   leptospermum manuka honey  1 Application Topical Daily   levothyroxine  125 mcg Oral Q0600   metFORMIN  500 mg Oral BID WC   montelukast  10 mg Oral QHS   rOPINIRole  0.25 mg Oral QHS   rosuvastatin  20 mg Oral Daily   tamsulosin  0.4 mg Oral BID   Continuous Infusions:  ceFEPime (MAXIPIME) IV 2 g (06/03/23 0029)     LOS: 1 day    Time spent: 52 minutes spent on chart review, discussion with nursing staff, consultants, updating family and interview/physical exam; more than 50% of that time was spent in counseling and/or coordination of care.    Alvira Philips [place], DO [facility] Available via Epic secure chat 7am-7pm After these hours, please refer to coverage provider listed on [email or web address] 06/03/2023, 12:34 PM

## 2023-06-03 NOTE — Progress Notes (Signed)
Mobility Specialist - Progress Note   06/03/23 0925  Mobility  Activity Ambulated with assistance in hallway  Level of Assistance Standby assist, set-up cues, supervision of patient - no hands on  Assistive Device Front wheel walker  Distance Ambulated (ft) 250 ft  Activity Response Tolerated well  Mobility Referral Yes  $Mobility charge 1 Mobility  Mobility Specialist Start Time (ACUTE ONLY) [id number]  Mobility Specialist Stop Time (ACUTE ONLY) [id number]  Mobility Specialist Time Calculation (min) (ACUTE ONLY) 19 min   Pt received in bed and agreeable to mobility. C/o pain near would rating it an 8/10. No other complaints during session. Pt to EOB  after session with all needs met.    [facility]

## 2023-06-04 DIAGNOSIS — L03115 Cellulitis of right lower limb: Secondary | ICD-10-CM | POA: Diagnosis not present

## 2023-06-04 LAB — GLUCOSE, CAPILLARY
Glucose-Capillary: 147 mg/dL — ABNORMAL HIGH (ref 70–99)
Glucose-Capillary: 114 mg/dL — ABNORMAL HIGH (ref 70–99)
Glucose-Capillary: 147 mg/dL — ABNORMAL HIGH (ref 70–99)

## 2023-06-04 MED ORDER — INSULIN ASPART 100 UNIT/ML IJ SOLN
10.0000 [IU] | Freq: Three times a day (TID) | INTRAMUSCULAR | Status: DC
  Administered 2023-06-04 – 2023-06-05 (×3): 10 [IU] via SUBCUTANEOUS

## 2023-06-04 NOTE — Progress Notes (Signed)
PROGRESS NOTE    Sean Saunders  WJX:[id number] DOB: 05/06/1946 DOA: 05/31/2023 PCP: [facility]    Brief Narrative:   Sean Saunders is a 77 y.o. male with past medical history significant for type 2 diabetes mellitus, essential hypertension, PAD, hypothyroidism, RLS, history of TIA who presented to [facility] ED on 6/19 due to progressive redness, pain to his right lower extremity.  Patient reports he fell off a dock at [address] on [address] sustaining injury to his right shin, right knee and left ankle.  Patient started developing erythema and swelling and was seen in the ED on 6/18 and diagnosed with a right wrist fracture and started on Keflex for cellulitis.  He received a tetanus shot in the ED during that visit.  Despite treatment with Keflex, cellulitis has rapidly progressed in which he sought further care once again in the ED.  In the ED, temperature 98.3 F, HR 80, RR 17, BP 156/74, SpO2 95% on room air.  WBC 8.4, hemoglobin 13.3, platelets 174.  Sodium 138, potassium 4.4, chloride 108, CO2 23, glucose 159, BUN 27, creatinine 1.34.  AST 19, ALT 15, total bilirubin 0.6.  CRP 3.0.  Right tibia/fibula x-ray with no acute osseous abnormality, soft tissues are unremarkable.  Patient was started on vancomycin and cefepime.  TRH consulted for admission for further evaluation management of progressive cellulitis poorly responsive to outpatient antibiotics.  Assessment & Plan:   Right lower extremity cellulitis Patient presenting with progressive erythema, tenderness to right lower extremity despite outpatient antibiotic use with Keflex.  Although Keflex dose was likely not sufficient.  [place]  Received tetanus shot on 6/18.  Patient is afebrile without leukocytosis.  X-ray right tibia/fibula with no acute findings. -- Continue cefepime (consider transition to Levaquin once improved to complete 10-day course) --  Wound Care: Cleanse wounds (right knee, right lower leg and left ankle) with NS and pat dry. Apply medihoney to wound bed. Cover with foam dressing. Peel back and replace medihoney daily Replace foam every three days.   Right triquetral fracture Right hand x-ray from 6/18 notable for small osseous fleck along the dorsum of the wrist consistent with a triquetral fracture. -- Continue splint -- Outpatient follow-up with PCP/orthopedics  Type 2 diabetes mellitus Hemoglobin A1c 7.0, well-controlled.  Home regimen includes Jardiance, Lantus 65 units subcutaneously daily, NovoLog 40 units 3 times daily AC, metformin 5 mg p.o. twice daily. -- Semglee 65u [place] daily -- NovoLog 6 u TIDAC -- SSI for coverage -- CBGs qAC/HS  Essential hypertension -- Amlodipine 10 mg p.o. daily -- Carvedilol 12.5 mg p.o. twice daily -- Benazepril 40 mg p.o. daily -- Furosemide 20 mg p.o. daily -- Continue statin and Plavix  Hyperlipidemia: -- Crestor 20 mg p.o. daily  COPD Follows with [place] pul[facility]ology outpatient; Dr. Delton Coombes.  Previously oxygen dependent which now has been weaned off.  Most recent visit 04/2023, apparently on Trelegy inhaler and longtime azithromycin use. -- Singulair 10 mg p.o. nightly -- DuoNebs every 6 hours as needed wheezing/shortness of breath  PAD -- Continue Plavix 75 mg p.o. daily  Restless leg syndrome -- Requip 0.25 mg p.o. nightly  BPH -- Tamsulosin 0.4 mg p.o. twice daily    DVT prophylaxis: enoxaparin (LOVENOX) injection 40 mg Start: 06/01/23 1000    Code Status: DNR Family Communication: No family present at bedside this morning  Disposition Plan:  Level of care: Med-Surg Status is: Inpatient  Remains inpatient appropriate because: IV antibiotics    Consultants:  None  Procedures:  None  Antimicrobials:  Vancomycin 6/19 - 6/19 Cefepime 6/19>>   Subjective: Patient seen examined bedside, resting comfortably.  Lying in bed.  No family present.  Remains  on IV antibiotics.  Complaining of pain to his right leg while ambulating.  Also complains of restless leg syndrome overnight.  Complaining that his intercom to the nurses station not working and waiting on pain medication.    Patient denies headache, no dizziness, no chest pain, no palpitations, no shortness of breath, no abdominal pain, no fever/chills/night sweats, no nausea/vomiting/diarrhea, no focal weakness, no fatigue, no paresthesias.  No acute events overnight per nursing staff.  Objective: Vitals:   06/03/23 1304 06/03/23 2132 06/04/23 0627 06/04/23 0824  BP: (!) 139/55 (!) 174/82 (!) 140/61   Pulse: 69 73 63   Resp: 16 18 18    Temp: (!) 97.5 F (36.4 C) 97.8 F (36.6 C) 97.6 F (36.4 C)   TempSrc: Oral Oral Oral   SpO2: 95% 94% 94% 92%  Weight:      Height:        Intake/Output Summary (Last 24 hours) at 06/04/2023 1158 Last data filed at 06/04/2023 1610 Gross per 24 hour  Intake 1400 ml  Output 3550 ml  Net -2150 ml   Filed Weights   05/31/23 2327  Weight: 97.5 kg    Examination:  Physical Exam: GEN: NAD, alert and oriented x 3, wd/wn HEENT: NCAT, PERRL, EOMI, sclera clear, MMM PULM: CTAB w/o wheezes/crackles, normal respiratory effort, on room air CV: RRR w/o M/G/R GI: abd soft, NTND, NABS, no R/G/M MSK: no peripheral edema, muscle strength globally intact 5/5 bilateral upper/lower extremities NEURO: CN II-XII intact, no focal deficits, sensation to light touch intact PSYCH: normal mood/affect Integumentary: Right lower extremity anterior shin with erythema, TTP without fluctuance/purulent drainage, noted right knee abrasion, left medial ankle lesion, otherwise no other concerning rashes/lesions/wounds noted on exposed skin surfaces.   6/20:   6/20:   6/21:   6/22:    6/23:   Data Reviewed: I have personally reviewed following labs and imaging studies  CBC: Recent Labs  Lab 06/01/23 0019 06/01/23 0558 06/02/23 0503  WBC 8.4 6.6 7.1   NEUTROABS 4.6  --   --   HGB 13.3 13.0 12.8*  HCT 41.5 40.8 39.4  MCV 88.7 90.1 88.3  PLT 174 144* 157   Basic Metabolic Panel: Recent Labs  Lab 06/01/23 0019 06/01/23 0558 06/02/23 0503  NA 138 138 139  K 4.4 3.9 4.4  CL 108 112* 110  CO2 23 20* 23  GLUCOSE 159* 132* 126*  BUN 27* 24* 30*  CREATININE 1.34* 1.13 1.29*  CALCIUM 8.7* 7.3* 8.4*   GFR: Estimated Creatinine Clearance: 57.1 mL/min (A) (by C-G formula based on SCr of 1.29 mg/dL (H)). Liver Function Tests: Recent Labs  Lab 06/01/23 0019  AST 19  ALT 15  ALKPHOS 81  BILITOT 0.6  PROT 6.4*  ALBUMIN 3.7   No results for input(s): "LIPASE", "AMYLASE" in the last 168 hours. No results for input(s): "AMMONIA" in the last 168 hours. Coagulation Profile: No results for input(s): "INR", "PROTIME" in the last 168 hours. Cardiac Enzymes: No results for input(s): "CKTOTAL", "CKMB", "CKMBINDEX", "TROPONINI" in the last 168 hours. BNP (last 3 results) No results for input(s): "PROBNP" in the last 8760 hours. HbA1C: No results for input(s): "HGBA1C" in the last 72 hours.  CBG: Recent Labs  Lab  06/03/23 0718 06/03/23 1123 06/03/23 1617 06/03/23 2129 06/04/23 0736  GLUCAP 99 167* 178* 259* 147*   Lipid Profile: No results for input(s): "CHOL", "HDL", "LDLCALC", "TRIG", "CHOLHDL", "LDLDIRECT" in the last 72 hours. Thyroid Function Tests: No results for input(s): "TSH", "T4TOTAL", "FREET4", "T3FREE", "THYROIDAB" in the last 72 hours. Anemia Panel: No results for input(s): "VITAMINB12", "FOLATE", "FERRITIN", "TIBC", "IRON", "RETICCTPCT" in the last 72 hours. Sepsis Labs: No results for input(s): "PROCALCITON", "LATICACIDVEN" in the last 168 hours.  Recent Results (from the past 240 hour(s))  MRSA Next Gen by PCR, Nasal     Status: None   Collection Time: 06/01/23  2:07 AM   Specimen: Nasal Mucosa; Nasal Swab  Result Value Ref Range Status   MRSA by PCR Next Gen NOT DETECTED NOT DETECTED Final    Comment:  (NOTE) The GeneXpert MRSA Assay (FDA approved for NASAL specimens only), is one component of a comprehensive MRSA colonization surveillance program. It is not intended to diagnose MRSA infection nor to guide or monitor treatment for MRSA infections. Test performance is not FDA approved in patients less than 35 years old. Performed at [facility], [address] [place], [place] [address]          Radiology Studies: No results found.      Scheduled Meds:  allopurinol  100 mg Oral BID   amLODipine  10 mg Oral Daily   azithromycin  250 mg Oral Daily   benazepril  40 mg Oral Daily   carvedilol  12.5 mg Oral BID WC   clopidogrel  75 mg Oral Daily   empagliflozin  25 mg Oral Daily   enoxaparin (LOVENOX) injection  40 mg Subcutaneous Q24H   fluticasone  2 spray Each Nare Daily   fluticasone furoate-vilanterol  1 puff Inhalation Daily   And   umeclidinium bromide  1 puff Inhalation Daily   furosemide  20 mg Oral Daily   insulin aspart  0-20 Units Subcutaneous TID WC   insulin aspart  10 Units Subcutaneous TID WC   insulin glargine-yfgn  65 Units Subcutaneous Daily   latanoprost  1 drop Both Eyes QHS   leptospermum manuka honey  1 Application Topical Daily   levothyroxine  125 mcg Oral Q0600   metFORMIN  500 mg Oral BID WC   montelukast  10 mg Oral QHS   rOPINIRole  0.25 mg Oral QHS   rosuvastatin  20 mg Oral Daily   tamsulosin  0.4 mg Oral BID   Continuous Infusions:  ceFEPime (MAXIPIME) IV 2 g (06/04/23 0115)     LOS: 2 days    Time spent: 52 minutes spent on chart review, discussion with nursing staff, consultants, updating family and interview/physical exam; more than 50% of that time was spent in counseling and/or coordination of care.    Alvira Philips [place], DO [facility] Available via Epic secure chat 7am-7pm After these hours, please refer to coverage provider listed on [email or web address] 06/04/2023, 11:58 AM

## 2023-06-05 DIAGNOSIS — L03115 Cellulitis of right lower limb: Secondary | ICD-10-CM | POA: Diagnosis not present

## 2023-06-05 LAB — GLUCOSE, CAPILLARY: Glucose-Capillary: 143 mg/dL — ABNORMAL HIGH (ref 70–99)

## 2023-06-05 MED ORDER — GABAPENTIN 100 MG PO CAPS
300.0000 mg | ORAL_CAPSULE | Freq: Three times a day (TID) | ORAL | Status: DC
  Administered 2023-06-05: 300 mg via ORAL
  Filled 2023-06-05: qty 3

## 2023-06-05 MED ORDER — GABAPENTIN 300 MG PO CAPS: 300.0000 mg | ORAL_CAPSULE | Freq: Three times a day (TID) | ORAL | 0 refills | Status: DC

## 2023-06-05 MED ORDER — AZITHROMYCIN 250 MG PO TABS: 250.0000 mg | ORAL_TABLET | Freq: Every day | ORAL | 0 refills | Status: DC

## 2023-06-05 MED ORDER — LEVOFLOXACIN 750 MG PO TABS: 750.0000 mg | ORAL_TABLET | Freq: Every day | ORAL | 0 refills | Status: AC

## 2023-06-05 MED ORDER — OXYCODONE HCL 5 MG PO TABS: 5.0000 mg | ORAL_TABLET | Freq: Three times a day (TID) | ORAL | 0 refills | Status: AC | PRN

## 2023-06-05 NOTE — Discharge Summary (Signed)
Physician Discharge Summary  Sean Saunders:[id number] DOB: 1946-02-09 DOA: 05/31/2023  PCP: Clinic, Sean Saunders  Admit date: 05/31/2023 Discharge date: 06/05/2023  Admitted From: Home Disposition: Home  Recommendations for Outpatient Follow-up:  Follow up with PCP in 1-2 weeks Follow-up with orthopedics for triquetral fracture Continue antibiotics with Levaquin to complete course for right lower extremity cellulitis  Home Health: No  Equipment/Devices: None  Discharge Condition: Stable CODE STATUS: DNR Diet recommendation: Heart healthy/consistent carb regular diet  History of present illness:  Sean Saunders is a 77 y.o. male with past medical history significant for type 2 diabetes mellitus, essential hypertension, PAD, hypothyroidism, RLS, history of TIA who presented to [facility] ED on 6/19 due to progressive redness, pain to his right lower extremity.  Patient reports he fell off a dock at [facility] on 616 sustaining injury to his right shin, right knee and left ankle.  Patient started developing erythema and swelling and was seen in the ED on 6/18 and diagnosed with a right wrist fracture and started on Keflex for cellulitis.  He received a tetanus shot in the ED during that visit.  Despite treatment with Keflex, cellulitis has rapidly progressed in which he sought further care once again in the ED.   In the ED, temperature 98.3 F, HR 80, RR 17, BP 156/74, SpO2 95% on room air.  WBC 8.4, hemoglobin 13.3, platelets 174.  Sodium 138, potassium 4.4, chloride 108, CO2 23, glucose 159, BUN 27, creatinine 1.34.  AST 19, ALT 15, total bilirubin 0.6.  CRP 3.0.  Right tibia/fibula x-ray with no acute osseous abnormality, soft tissues are unremarkable.  Patient was started on vancomycin and cefepime.  TRH consulted for admission for further evaluation management of progressive cellulitis poorly responsive to outpatient antibiotics.  Hospital course:  Right lower  extremity cellulitis Patient presenting with progressive erythema, tenderness to right lower extremity despite outpatient antibiotic use with Keflex.  Although Keflex dose was likely not sufficient.  [place]  Received tetanus shot on 6/18.  Patient is afebrile without leukocytosis.  X-ray right tibia/fibula with no acute findings.  Continued on cefepime during hospitalization, will transition to Levaquin to complete 10-day course on discharge after discussion with infectious disease.  Outpatient follow-up with PCP.   Right triquetral fracture Right hand x-ray from 6/18 notable for small osseous fleck along the dorsum of the wrist consistent with a triquetral fracture. Continue splint, outpatient follow-up with PCP/orthopedics   Type 2 diabetes mellitus Hemoglobin A1c 7.0, well-controlled.  Home regimen includes Jardiance, Lantus 65 units subcutaneously daily, NovoLog 40 units 3 times daily AC, metformin 5 mg p.o. twice daily.   Essential hypertension Continue amlodipine 10 mg p.o. daily, Carvedilol 12.5 mg p.o. twice daily, Benazepril 40 mg p.o. daily, Furosemide 20 mg p.o. daily. Continue statin and Plavix   Hyperlipidemia: Crestor 20 mg p.o. daily   COPD Follows with [facility] pulmonology outpatient; Dr. Delton Saunders.  Previously oxygen dependent which now has been weaned off.  Most recent visit 04/2023, apparently on Trelegy inhaler and longtime azithromycin use. Singulair 10 mg p.o. nightly   PAD Continue Plavix 75 mg p.o. daily   Restless leg syndrome Requip 0.25 mg p.o. nightly   BPH Tamsulosin 0.4 mg p.o. twice daily  Discharge Diagnoses:  Principal Problem:   Cellulitis of right leg Active Problems:   Type 2 diabetes mellitus with diabetic neuropathy, unspecified (HCC)   Hypertension with heart disease  CKD (chronic kidney disease), stage III (HCC)   Cellulitis of right lower extremity    Discharge  Instructions  Discharge Instructions     Diet - low sodium heart healthy   Complete by: As directed    Increase activity slowly   Complete by: As directed    No wound care   Complete by: As directed       Allergies as of 06/05/2023       Reactions   Eszopiclone Itching   Niaspan [niacin Er] Nausea And Vomiting, Rash   Finasteride    Niacin Other (See Comments)   Other Hives, Nausea And Vomiting   Ozempic and         Medication List     STOP taking these medications    cephALEXin 500 MG capsule Commonly known as: KEFLEX   oxyCODONE-acetaminophen 5-325 MG tablet Commonly known as: PERCOCET/ROXICET       TAKE these medications    albuterol 108 (90 Base) MCG/ACT inhaler Commonly known as: VENTOLIN HFA Inhale 2 puffs into the lungs every 6 (six) hours as needed for wheezing or shortness of breath.   allopurinol 100 MG tablet Commonly known as: ZYLOPRIM Take 100 mg by mouth 2 (two) times daily.   amLODipine 10 MG tablet Commonly known as: NORVASC Take 10 mg by mouth daily. What changed: Another medication with the same name was removed. Continue taking this medication, and follow the directions you see here.   benazepril 40 MG tablet Commonly known as: LOTENSIN Take 40 mg by mouth daily.   carvedilol 12.5 MG tablet Commonly known as: COREG Take 12.5 mg by mouth 2 (two) times daily with a meal. What changed: Another medication with the same name was removed. Continue taking this medication, and follow the directions you see here.   cetirizine 10 MG tablet Commonly known as: ZYRTEC TAKE 1 TABLET(10 MG) BY MOUTH DAILY   clopidogrel 75 MG tablet Commonly known as: PLAVIX Take 75 mg by mouth daily.   fluticasone 50 MCG/ACT nasal spray Commonly known as: FLONASE Place 2 sprays into both nostrils daily.   furosemide 20 MG tablet Commonly known as: LASIX Take 20 mg by mouth daily.   gabapentin 300 MG capsule Commonly known as: NEURONTIN Take 1  capsule (300 mg total) by mouth 3 (three) times daily.   insulin glargine 100 UNIT/ML injection Commonly known as: LANTUS Inject 65 Units into the skin in the morning.   ipratropium-albuterol 0.5-2.5 (3) MG/3ML Soln Commonly known as: DUONEB Take 3 mLs by nebulization every 6 (six) hours as needed (sob & wheezing).   Combivent Respimat 20-100 MCG/ACT Aers respimat Generic drug: Ipratropium-Albuterol Inhale 1 puff into the lungs 2 (two) times daily as needed for wheezing or shortness of breath.   Jardiance 25 MG Tabs tablet Generic drug: empagliflozin Take 25 mg by mouth daily.   latanoprost 0.005 % ophthalmic solution Commonly known as: XALATAN Place 1 drop into both eyes at bedtime.   levofloxacin 750 MG tablet Commonly known as: Levaquin Take 1 tablet (750 mg total) by mouth daily for 7 days.   levothyroxine 125 MCG tablet Commonly known as: SYNTHROID Take 125 mcg by mouth daily before breakfast.   metFORMIN 500 MG tablet Commonly known as: GLUCOPHAGE Take 500 mg by mouth 2 (two) times daily with a meal.   montelukast 10 MG tablet Commonly known as: SINGULAIR TAKE 1 TABLET(10 MG) BY MOUTH AT BEDTIME   multivitamin with minerals Tabs tablet Take 1 tablet by mouth  daily.   NovoLOG FlexPen 100 UNIT/ML FlexPen Generic drug: insulin aspart Inject 40 Units into the skin 3 (three) times daily with meals.   oxyCODONE 5 MG immediate release tablet Commonly known as: Roxicodone Take 1 tablet (5 mg total) by mouth every 8 (eight) hours as needed for moderate pain.   rOPINIRole 0.25 MG tablet Commonly known as: REQUIP Take 0.25 mg by mouth at bedtime.   rosuvastatin 40 MG tablet Commonly known as: CRESTOR Take 20 mg by mouth in the morning and at bedtime.   tamsulosin 0.4 MG Caps capsule Commonly known as: FLOMAX Take 0.4 mg by mouth in the morning and at bedtime.        Allergies  Allergen Reactions   Eszopiclone Itching   Niaspan [Niacin Er] Nausea And  Vomiting and Rash   Finasteride    Niacin Other (See Comments)   Other Hives and Nausea And Vomiting    Ozempic and     Consultations: None   Procedures/Studies: DG Tibia/Fibula Right  Result Date: 06/01/2023 CLINICAL DATA:  Cellulitis of right leg. EXAM: RIGHT TIBIA AND FIBULA - 2 VIEW COMPARISON:  05/30/2023. FINDINGS: There is no evidence of fracture or other focal bone lesions. No bony erosion or periosteal elevation. Mild calcaneal spurring is noted. Soft tissues are unremarkable. IMPRESSION: No acute osseous abnormality. Electronically Signed   By: Thornell Sartorius M.D.   On: 06/01/2023 04:06   VAS [place] LOWER EXTREMITY VENOUS (DVT)  Result Date: 05/31/2023  Lower Venous DVT Study Patient Name:  JAMMY PLOTKIN  Date of Exam:   05/30/2023 Medical Rec #: [id number]        Accession #:    [id number] Date of Birth: Oct 09, 1946        Patient Gender: M Patient Age:   77 years Exam Location:  [facility] Procedure:      VAS [place] LOWER EXTREMITY VENOUS (DVT) Referring Phys: Nehemiah Settle SMALL --------------------------------------------------------------------------------  Indications: Swelling.  Limitations: Body habitus. Comparison Study: Previous 05/01/21 negative Performing Technologist: McKayla Maag RVT, [place]  Examination Guidelines: A complete evaluation includes B-mode imaging, spectral Doppler, color Doppler, and power Doppler as needed of all accessible portions of each vessel. Bilateral testing is considered an integral part of a complete examination. Limited examinations for reoccurring indications may be performed as noted. The reflux portion of the exam is performed with the patient in reverse Trendelenburg.  +---------+---------------+---------+-----------+----------+--------------+ RIGHT    CompressibilityPhasicitySpontaneityPropertiesThrombus Aging +---------+---------------+---------+-----------+----------+--------------+ CFV      Full           Yes      Yes                                  +---------+---------------+---------+-----------+----------+--------------+ SFJ      Full                                                        +---------+---------------+---------+-----------+----------+--------------+ FV Prox  Full                                                        +---------+---------------+---------+-----------+----------+--------------+  FV Mid   Full                                                        +---------+---------------+---------+-----------+----------+--------------+ FV DistalFull                                                        +---------+---------------+---------+-----------+----------+--------------+ PFV      Full                                                        +---------+---------------+---------+-----------+----------+--------------+ POP      Full           Yes      Yes                                 +---------+---------------+---------+-----------+----------+--------------+ PTV      Full                                                        +---------+---------------+---------+-----------+----------+--------------+ PERO     Full                                         limited        +---------+---------------+---------+-----------+----------+--------------+   +----+---------------+---------+-----------+----------+--------------+ LEFTCompressibilityPhasicitySpontaneityPropertiesThrombus Aging +----+---------------+---------+-----------+----------+--------------+ CFV Full           Yes      Yes                                 +----+---------------+---------+-----------+----------+--------------+ SFJ Full                                                        +----+---------------+---------+-----------+----------+--------------+     Summary: RIGHT: - There is no evidence of deep vein thrombosis in the lower extremity. However, portions of this examination were limited- see  technologist comments above.  - No cystic structure found in the popliteal fossa.  LEFT: - No evidence of common femoral vein obstruction.  *See table(s) above for measurements and observations. Electronically signed by Waverly Ferrari MD on 05/31/2023 at 7:18:26 AM.    Final    DG Shoulder Right  Result Date: 05/30/2023 CLINICAL DATA:  Pain EXAM: RIGHT SHOULDER - 3 VIEW COMPARISON:  None Available. FINDINGS: There is no evidence of fracture or dislocation. There is no evidence of arthropathy or other focal bone abnormality. Soft tissues are unremarkable. IMPRESSION: Negative. Electronically Signed  By: Allegra Lai M.D.   On: 05/30/2023 18:25   DG Knee Complete 4 Views Right  Result Date: 05/30/2023 CLINICAL DATA:  fall, pain EXAM: RIGHT KNEE - COMPLETE 4+ VIEW; RIGHT TIBIA AND FIBULA - 2 VIEW; RIGHT ANKLE - COMPLETE 3+ VIEW; RIGHT FOOT COMPLETE - 3+ VIEW COMPARISON:  None Available. FINDINGS: No acute fracture or dislocation. Joint spaces and alignment are maintained. No area of erosion or osseous destruction. No unexpected radiopaque foreign body. Scattered soft tissue calcifications. Soft tissue edema. IMPRESSION: 1. No acute fracture or dislocation. 2. Soft tissue edema. Electronically Signed   By: Meda Klinefelter M.D.   On: 05/30/2023 17:03   DG Tibia/Fibula Right  Result Date: 05/30/2023 CLINICAL DATA:  fall, pain EXAM: RIGHT KNEE - COMPLETE 4+ VIEW; RIGHT TIBIA AND FIBULA - 2 VIEW; RIGHT ANKLE - COMPLETE 3+ VIEW; RIGHT FOOT COMPLETE - 3+ VIEW COMPARISON:  None Available. FINDINGS: No acute fracture or dislocation. Joint spaces and alignment are maintained. No area of erosion or osseous destruction. No unexpected radiopaque foreign body. Scattered soft tissue calcifications. Soft tissue edema. IMPRESSION: 1. No acute fracture or dislocation. 2. Soft tissue edema. Electronically Signed   By: Meda Klinefelter M.D.   On: 05/30/2023 17:03   DG Ankle Complete Right  Result Date:  05/30/2023 CLINICAL DATA:  fall, pain EXAM: RIGHT KNEE - COMPLETE 4+ VIEW; RIGHT TIBIA AND FIBULA - 2 VIEW; RIGHT ANKLE - COMPLETE 3+ VIEW; RIGHT FOOT COMPLETE - 3+ VIEW COMPARISON:  None Available. FINDINGS: No acute fracture or dislocation. Joint spaces and alignment are maintained. No area of erosion or osseous destruction. No unexpected radiopaque foreign body. Scattered soft tissue calcifications. Soft tissue edema. IMPRESSION: 1. No acute fracture or dislocation. 2. Soft tissue edema. Electronically Signed   By: Meda Klinefelter M.D.   On: 05/30/2023 17:03   DG Foot Complete Right  Result Date: 05/30/2023 CLINICAL DATA:  fall, pain EXAM: RIGHT KNEE - COMPLETE 4+ VIEW; RIGHT TIBIA AND FIBULA - 2 VIEW; RIGHT ANKLE - COMPLETE 3+ VIEW; RIGHT FOOT COMPLETE - 3+ VIEW COMPARISON:  None Available. FINDINGS: No acute fracture or dislocation. Joint spaces and alignment are maintained. No area of erosion or osseous destruction. No unexpected radiopaque foreign body. Scattered soft tissue calcifications. Soft tissue edema. IMPRESSION: 1. No acute fracture or dislocation. 2. Soft tissue edema. Electronically Signed   By: Meda Klinefelter M.D.   On: 05/30/2023 17:03   DG Forearm Right  Result Date: 05/30/2023 CLINICAL DATA:  fall, distal radius pain, 2nd metacarpal pain EXAM: RIGHT FOREARM - 2 VIEW; RIGHT HAND - COMPLETE 3+ VIEW COMPARISON:  February 19, 2005 FINDINGS: Status post amputation of the distal second phalanx. There is a small osseous fleck along the dorsum of the wrist consistent with a triquetral fracture. Soft tissue edema. No unexpected radiopaque foreign body. IMPRESSION: There is a small osseous fleck along the dorsum of the wrist consistent with a triquetral fracture. Electronically Signed   By: Meda Klinefelter M.D.   On: 05/30/2023 16:59   DG Hand Complete Right  Result Date: 05/30/2023 CLINICAL DATA:  fall, distal radius pain, 2nd metacarpal pain EXAM: RIGHT FOREARM - 2 VIEW; RIGHT HAND -  COMPLETE 3+ VIEW COMPARISON:  February 19, 2005 FINDINGS: Status post amputation of the distal second phalanx. There is a small osseous fleck along the dorsum of the wrist consistent with a triquetral fracture. Soft tissue edema. No unexpected radiopaque foreign body. IMPRESSION: There is a small osseous fleck along the dorsum  of the wrist consistent with a triquetral fracture. Electronically Signed   By: Meda Klinefelter M.D.   On: 05/30/2023 16:59     Subjective: Patient seen examined bedside, resting calmly.  Lying in bed.  Complaining about his wound being dressed as it is causing pain and discomfort each time it is removed.  Discussed with patient he can keep open to air after discharge.  No other specific complaints or concerns at this time.  Denies headache, no dizziness, no chest pain, no shortness of breath, no abdominal pain, no fever/chills/night sweats, no nausea/vomiting/diarrhea, no focal weakness, no fatigue, no paresthesias.  No acute events overnight per nursing staff.  Discharge Exam: Vitals:   06/05/23 0622 06/05/23 0813  BP: (!) 167/65   Pulse: 70   Resp: 17   Temp: 97.6 F (36.4 C)   SpO2: 94% 93%   Vitals:   06/04/23 2025 06/05/23 0245 06/05/23 0622 06/05/23 0813  BP: (!) 168/57 118/73 (!) 167/65   Pulse: 64 72 70   Resp: 17 18 17    Temp: 97.6 F (36.4 C) 98.5 F (36.9 C) 97.6 F (36.4 C)   TempSrc: Oral Oral Oral   SpO2: 95% 93% 94% 93%  Weight:      Height:        Physical Exam: GEN: NAD, alert and oriented x 3, wd/wn HEENT: NCAT, PERRL, EOMI, sclera clear, MMM PULM: CTAB w/o wheezes/crackles, normal respiratory effort, on room air CV: RRR w/o M/G/R GI: abd soft, NTND, NABS, no R/G/M MSK: no peripheral edema, muscle strength globally intact 5/5 bilateral upper/lower extremities NEURO: CN II-XII intact, no focal deficits, sensation to light touch intact PSYCH: normal mood/affect Integumentary: Right lower extremity anterior shin with erythema improving,  TTP without fluctuance/purulent drainage, noted right knee abrasion, left medial ankle lesion, otherwise no other concerning rashes/lesions/wounds noted on exposed skin surfaces.   6/20:   6/23:    The results of significant diagnostics from this hospitalization (including imaging, microbiology, ancillary and laboratory) are listed below for reference.     Microbiology: Recent Results (from the past 240 hour(s))  MRSA Next Gen by PCR, Nasal     Status: None   Collection Time: 06/01/23  2:07 AM   Specimen: Nasal Mucosa; Nasal Swab  Result Value Ref Range Status   MRSA by PCR Next Gen NOT DETECTED NOT DETECTED Final    Comment: (NOTE) The GeneXpert MRSA Assay (FDA approved for NASAL specimens only), is one component of a comprehensive MRSA colonization surveillance program. It is not intended to diagnose MRSA infection nor to guide or monitor treatment for MRSA infections. Test performance is not FDA approved in patients less than 83 years old. Performed at [facility], [address], [place], [place] [address]      Labs: BNP (last 3 results) No results for input(s): "BNP" in the last 8760 hours. Basic Metabolic Panel: Recent Labs  Lab 06/01/23 0019 06/01/23 0558 06/02/23 0503  NA 138 138 139  K 4.4 3.9 4.4  CL 108 112* 110  CO2 23 20* 23  GLUCOSE 159* 132* 126*  BUN 27* 24* 30*  CREATININE 1.34* 1.13 1.29*  CALCIUM 8.7* 7.3* 8.4*   Liver Function Tests: Recent Labs  Lab 06/01/23 0019  AST 19  ALT 15  ALKPHOS 81  BILITOT 0.6  PROT 6.4*  ALBUMIN 3.7   No results for input(s): "LIPASE", "AMYLASE" in the last 168 hours. No results for input(s): "AMMONIA" in the last 168 hours. CBC: Recent  Labs  Lab 06/01/23 0019 06/01/23 0558 06/02/23 0503  WBC 8.4 6.6 7.1  NEUTROABS 4.6  --   --   HGB 13.3 13.0 12.8*  HCT 41.5 40.8 39.4  MCV 88.7 90.1 88.3  PLT 174 144* 157   Cardiac Enzymes: No results for input(s): "CKTOTAL", "CKMB",  "CKMBINDEX", "TROPONINI" in the last 168 hours. BNP: Invalid input(s): "POCBNP" CBG: Recent Labs  Lab 06/03/23 2129 06/04/23 0736 06/04/23 1210 06/04/23 1627 06/05/23 0735  GLUCAP 259* 147* 114* 147* 143*   D-Dimer No results for input(s): "DDIMER" in the last 72 hours. Hgb A1c No results for input(s): "HGBA1C" in the last 72 hours. Lipid Profile No results for input(s): "CHOL", "HDL", "LDLCALC", "TRIG", "CHOLHDL", "LDLDIRECT" in the last 72 hours. Thyroid function studies No results for input(s): "TSH", "T4TOTAL", "T3FREE", "THYROIDAB" in the last 72 hours.  Invalid input(s): "FREET3" Anemia work up No results for input(s): "VITAMINB12", "FOLATE", "FERRITIN", "TIBC", "IRON", "RETICCTPCT" in the last 72 hours. Urinalysis    Component Value Date/Time   COLORURINE YELLOW 06/27/2020 0547   APPEARANCEUR CLEAR 06/27/2020 0547   LABSPEC 1.013 06/27/2020 0547   PHURINE 5.0 06/27/2020 0547   GLUCOSEU >=500 (A) 06/27/2020 0547   HGBUR NEGATIVE 06/27/2020 0547   BILIRUBINUR NEGATIVE 06/27/2020 0547   KETONESUR 5 (A) 06/27/2020 0547   PROTEINUR 100 (A) 06/27/2020 0547   UROBILINOGEN 0.2 07/03/2012 0937   NITRITE NEGATIVE 06/27/2020 0547   LEUKOCYTESUR NEGATIVE 06/27/2020 0547   Sepsis Labs Recent Labs  Lab 06/01/23 0019 06/01/23 0558 06/02/23 0503  WBC 8.4 6.6 7.1   Microbiology Recent Results (from the past 240 hour(s))  MRSA Next Gen by PCR, Nasal     Status: None   Collection Time: 06/01/23  2:07 AM   Specimen: Nasal Mucosa; Nasal Swab  Result Value Ref Range Status   MRSA by PCR Next Gen NOT DETECTED NOT DETECTED Final    Comment: (NOTE) The GeneXpert MRSA Assay (FDA approved for NASAL specimens only), is one component of a comprehensive MRSA colonization surveillance program. It is not intended to diagnose MRSA infection nor to guide or monitor treatment for MRSA infections. Test performance is not FDA approved in patients less than 52 years old. Performed at  [facility], [address], [place], [place] [address]      Time coordinating discharge: Over 30 minutes  SIGNED:   Alvira Philips [place], DO  [facility] 06/05/2023, 9:07 AM

## 2023-06-05 NOTE — Progress Notes (Signed)
Patient and patient's daughter was given discharge instructions, and all questions were answered. Patient was stable for discharge and was taken to the main exit by wheelchair.  

## 2023-06-14 ENCOUNTER — Other Ambulatory Visit: Payer: Self-pay

## 2023-06-14 ENCOUNTER — Emergency Department (HOSPITAL_COMMUNITY)
Admission: EM | Admit: 2023-06-14 | Discharge: 2023-06-14 | Discharge status: Against Medical Advice | Payer: No Typology Code available for payment source | Attending: Emergency Medicine | Admitting: Emergency Medicine

## 2023-06-14 ENCOUNTER — Encounter (HOSPITAL_COMMUNITY): Payer: Self-pay

## 2023-06-14 DIAGNOSIS — R2242 Localized swelling, mass and lump, left lower limb: Secondary | ICD-10-CM | POA: Insufficient documentation

## 2023-06-14 DIAGNOSIS — L84 Corns and callosities: Secondary | ICD-10-CM | POA: Diagnosis not present

## 2023-06-14 DIAGNOSIS — Z5321 Procedure and treatment not carried out due to patient leaving prior to being seen by health care provider: Secondary | ICD-10-CM | POA: Insufficient documentation

## 2023-06-14 DIAGNOSIS — I739 Peripheral vascular disease, unspecified: Secondary | ICD-10-CM | POA: Diagnosis not present

## 2023-06-14 DIAGNOSIS — E1151 Type 2 diabetes mellitus with diabetic peripheral angiopathy without gangrene: Secondary | ICD-10-CM | POA: Diagnosis not present

## 2023-06-14 NOTE — ED Triage Notes (Signed)
Pt arrived POV. Recent admission for cellulitis of left lower extremity. States today swollen and increase in pain. Denies fever, sob, cp or any other symptoms. Pt still on antibiotics, not getting better

## 2023-09-05 ENCOUNTER — Ambulatory Visit (INDEPENDENT_AMBULATORY_CARE_PROVIDER_SITE_OTHER): Payer: Medicare PPO | Admitting: Pulmonary Disease

## 2023-09-05 ENCOUNTER — Encounter: Payer: Self-pay | Admitting: Pulmonary Disease

## 2023-09-05 VITALS — BP 144/58 | HR 75 | Temp 97.3°F | Ht 72.0 in | Wt 214.2 lb

## 2023-09-05 DIAGNOSIS — J439 Emphysema, unspecified: Secondary | ICD-10-CM

## 2023-09-05 DIAGNOSIS — J440 Chronic obstructive pulmonary disease with acute lower respiratory infection: Secondary | ICD-10-CM | POA: Diagnosis not present

## 2023-09-05 DIAGNOSIS — J4489 Other specified chronic obstructive pulmonary disease: Secondary | ICD-10-CM

## 2023-09-05 MED ORDER — AZITHROMYCIN 250 MG PO TABS
ORAL_TABLET | ORAL | 0 refills | Status: DC
Start: 2023-09-05 — End: 2024-07-13

## 2023-09-05 MED ORDER — PREDNISONE 20 MG PO TABS
20.0000 mg | ORAL_TABLET | Freq: Every day | ORAL | 0 refills | Status: DC
Start: 2023-09-05 — End: 2024-07-13

## 2023-09-05 NOTE — Progress Notes (Signed)
Sean Saunders    [id number]    Jul 24, 1946  Primary Care Physician:[facility]  Referring Physician: [facility], [facility] [address] [facility] [place],  [place] [address]  Chief complaint:   Patient being seen for an acute visit  HPI:  History of COPD with flare For about 1 to 1-1/2 weeks, has had increased cough, sputum production Wheezing all the time Unable to complete activities secondary to worsening symptoms  Reformed smoker with severe COPD at baseline allergic rhinitis, chronic respiratory failure  History of diabetes, peripheral vascular disease  He has advanced centrilobular emphysema on last CT scan 08/26/2022-films were reviewed myself  Compliant with inhalers -Uses Trelegy  Does have allergies for which she is on Zyrtec, Singulair, Flonase  Outpatient Encounter Medications as of 09/05/2023  Medication Sig   albuterol (VENTOLIN HFA) 108 (90 Base) MCG/ACT inhaler Inhale 2 puffs into the lungs every 6 (six) hours as needed for wheezing or shortness of breath.   allopurinol (ZYLOPRIM) 100 MG tablet Take 100 mg by mouth 2 (two) times daily.   amLODipine (NORVASC) 10 MG tablet Take 10 mg by mouth daily.   azithromycin (ZITHROMAX) 250 MG tablet Take 1 tablet (250 mg total) by mouth daily.   benazepril (LOTENSIN) 40 MG tablet Take 40 mg by mouth daily.   carvedilol (COREG) 12.5 MG tablet Take 12.5 mg by mouth 2 (two) times daily with a meal.   cetirizine (ZYRTEC) 10 MG tablet TAKE 1 TABLET(10 MG) BY MOUTH DAILY   clopidogrel (PLAVIX) 75 MG tablet Take 75 mg by mouth daily.   empagliflozin (JARDIANCE) 25 MG TABS tablet Take 25 mg by mouth daily.   fluticasone (FLONASE) 50 MCG/ACT nasal spray Place 2 sprays into both nostrils daily.   furosemide (LASIX) 20 MG tablet Take 20 mg by mouth daily.   insulin aspart (NOVOLOG FLEXPEN) 100 UNIT/ML FlexPen Inject 40 Units into the skin 3 (three) times daily with meals.   insulin glargine  (LANTUS) 100 UNIT/ML injection Inject 65 Units into the skin in the morning.   Ipratropium-Albuterol (COMBIVENT RESPIMAT) 20-100 MCG/ACT AERS respimat Inhale 1 puff into the lungs 2 (two) times daily as needed for wheezing or shortness of breath.   ipratropium-albuterol (DUONEB) 0.5-2.5 (3) MG/3ML SOLN Take 3 mLs by nebulization every 6 (six) hours as needed (sob & wheezing).   latanoprost (XALATAN) 0.005 % ophthalmic solution Place 1 drop into both eyes at bedtime.   levothyroxine (SYNTHROID) 125 MCG tablet Take 125 mcg by mouth daily before breakfast.   metFORMIN (GLUCOPHAGE) 500 MG tablet Take 500 mg by mouth 2 (two) times daily with a meal.   montelukast (SINGULAIR) 10 MG tablet TAKE 1 TABLET(10 MG) BY MOUTH AT BEDTIME   Multiple Vitamin (MULTIVITAMIN WITH MINERALS) TABS tablet Take 1 tablet by mouth daily.   oxyCODONE (ROXICODONE) 5 MG immediate release tablet Take 1 tablet (5 mg total) by mouth every 8 (eight) hours as needed for moderate pain.   rOPINIRole (REQUIP) 0.25 MG tablet Take 0.25 mg by mouth at bedtime.   rosuvastatin (CRESTOR) 40 MG tablet Take 20 mg by mouth in the morning and at bedtime.   tamsulosin (FLOMAX) 0.4 MG CAPS capsule Take 0.4 mg by mouth in the morning and at bedtime.   gabapentin (NEURONTIN) 300 MG capsule Take 1 capsule (300 mg total) by mouth 3 (three) times daily.   No facility-administered encounter medications on file as of 09/05/2023.    Allergies as of 09/05/2023 -  Review Complete 09/05/2023  Allergen Reaction Noted   Eszopiclone Itching 08/02/2011   Niaspan [niacin er] Nausea And Vomiting and Rash 08/02/2011   Finasteride  08/09/2022   Niacin Other (See Comments) 05/31/2023   Other Hives and Nausea And Vomiting 10/11/2019    Past Medical History:  Diagnosis Date   Arthritis    COPD (chronic obstructive pulmonary disease) (HCC)    Diabetes mellitus    DVT (deep venous thrombosis) (HCC)    GERD (gastroesophageal reflux disease)    Hyperlipidemia     Hypertension    Hypothyroidism    Onychomycosis 06/07/2013   Stroke (HCC)    TIA's 01/05/11-01/08/11-01/26/11   TIA (transient ischemic attack) 08/03/2011   Left sided weakness. 3 episodes Feb-March    Past Surgical History:  Procedure Laterality Date   ABDOMINAL AORTAGRAM N/A 06/04/2012   Procedure: ABDOMINAL Ronny Flurry;  Surgeon: Chuck Hint, MD;  Location: [facility] CATH LAB;  Service: Cardiovascular;  Laterality: N/A;   APPENDECTOMY     COLONOSCOPY     FEMORAL-POPLITEAL BYPASS GRAFT  07/05/2012   Procedure: BYPASS GRAFT FEMORAL-POPLITEAL ARTERY;  Surgeon: Chuck Hint, MD;  Location: [facility] OR;  Service: Vascular;  Laterality: Left;  Revision Left fem/pop w/ artery patch angioplasty utilizing left GSV graft and extenion to tib/per trunk w/right GSV and interop arteriogram   JOINT REPLACEMENT  2011   -Hip   NASAL SINUS SURGERY     PR VEIN BYPASS GRAFT,AORTO-FEM-POP     PR VEIN BYPASS GRAFT,AORTO-FEM-POP  08-01-11   (L) Fem-pop BP   toe nail     TONSILLECTOMY      Family History  Problem Relation Age of Onset   Stroke Mother    Diabetes Mother    Other Father        alzheimers   Diabetes Father    Stroke Father    Diabetes Sister     Social History   Socioeconomic History   Marital status: Widowed    Spouse name: Not on file   Number of children: 1   Years of education: Not on file   Highest education level: Master's degree (e.g., MA, MS, MEng, MEd, MSW, MBA)  Occupational History   Occupation: retired  Tobacco Use   Smoking status: Former    Current packs/day: 0.00    Average packs/day: 1 pack/day for 25.0 years (25.0 ttl pk-yrs)    Types: Cigarettes    Start date: 06/05/1993    Quit date: 06/05/2018    Years since quitting: 5.2   Smokeless tobacco: Never  Vaping Use   Vaping status: Never Used  Substance and Sexual Activity   Alcohol use: Not Currently    Alcohol/week: 0.0 standard drinks of alcohol    Comment: rare   Drug use: No   Sexual activity:  Not on file  Other Topics Concern   Not on file  Social History Narrative   Not on file   Social Determinants of Health   Financial Resource Strain: Low Risk  (08/25/2020)   Overall Financial Resource Strain (CARDIA)    Difficulty of Paying Living Expenses: Not hard at all  Food Insecurity: No Food Insecurity (06/01/2023)   Hunger Vital Sign    Worried About Running Out of Food in the Last Year: Never true    Ran Out of Food in the Last Year: Never true  Transportation Needs: No Transportation Needs (06/01/2023)   PRAPARE - Transportation    Lack of Transportation (Medical): No    Lack of  Transportation (Non-Medical): No  Physical Activity: Inactive (08/25/2020)   Exercise Vital Sign    Days of Exercise per Week: 0 days    Minutes of Exercise per Session: 0 min  Stress: No Stress Concern Present (08/25/2020)   [employer] of Occupational Health - Occupational Stress Questionnaire    Feeling of Stress : Only a little  Social Connections: Moderately Isolated (08/25/2020)   Social Connection and Isolation Panel [NHANES]    Frequency of Communication with Friends and Family: More than three times a week    Frequency of Social Gatherings with Friends and Family: Twice a week    Attends Religious Services: More than 4 times per year    Active Member of [employer] or Organizations: No    Attends Banker Meetings: Never    Marital Status: Widowed  Intimate Partner Violence: Not At Risk (06/01/2023)   Humiliation, Afraid, Rape, and Kick questionnaire    Fear of Current or Ex-Partner: No    Emotionally Abused: No    Physically Abused: No    Sexually Abused: No    Review of Systems  Constitutional:  Positive for fatigue.  Respiratory:  Positive for cough, shortness of breath and wheezing.     Vitals:   09/05/23 1452  BP: (!) 144/58  Pulse: 75  Temp: (!) 97.3 F (36.3 C)  SpO2: 94%     Physical Exam Constitutional:      Appearance: Normal appearance.  HENT:      Mouth/Throat:     Mouth: Mucous membranes are moist.  Eyes:     General: No scleral icterus. Cardiovascular:     Rate and Rhythm: Regular rhythm.     Heart sounds: No murmur heard.    No friction rub.  Pulmonary:     Effort: No respiratory distress.     Breath sounds: No stridor. Rhonchi present. No wheezing.  Musculoskeletal:        General: No swelling.     Cervical back: No rigidity or tenderness.  Neurological:     Mental Status: He is alert.  Psychiatric:        Mood and Affect: Mood normal.    Data Reviewed: PFT from 05/22/2018 with severe obstructive disease-reviewed  CT scan 08/26/2022 with centrilobular emphysema-reviewed the films  Assessment:  Severe COPD with exacerbation -Significant limitation in activities in the last few days -Denies any fevers or chills but increased sputum  History of chronic respiratory failure  Type 2 diabetes  Peripheral arterial disease  Plan/Recommendations: Prescription for azithromycin  Prescription for prednisone  Continue bronchodilators  Tentative follow-up in 6 months  Encouraged to call if any significant concerns   Virl Diamond MD [facility] 09/05/2023, 3:06 PM  CC: [facility]

## 2023-09-05 NOTE — Patient Instructions (Signed)
Prescription for azithromycin and prednisone sent to pharmacy for you  Continue regular inhalers  Follow-up in about 6 months if you are doing fine  Call us with significant concerns

## 2023-09-13 DIAGNOSIS — E1151 Type 2 diabetes mellitus with diabetic peripheral angiopathy without gangrene: Secondary | ICD-10-CM | POA: Diagnosis not present

## 2023-09-13 DIAGNOSIS — L603 Nail dystrophy: Secondary | ICD-10-CM | POA: Diagnosis not present

## 2023-09-13 DIAGNOSIS — I739 Peripheral vascular disease, unspecified: Secondary | ICD-10-CM | POA: Diagnosis not present

## 2023-09-13 DIAGNOSIS — L84 Corns and callosities: Secondary | ICD-10-CM | POA: Diagnosis not present

## 2023-09-14 ENCOUNTER — Other Ambulatory Visit: Payer: Self-pay | Admitting: Emergency Medicine

## 2023-09-17 ENCOUNTER — Other Ambulatory Visit: Payer: Self-pay | Admitting: Emergency Medicine

## 2023-09-17 DIAGNOSIS — J309 Allergic rhinitis, unspecified: Secondary | ICD-10-CM

## 2023-10-18 DIAGNOSIS — I8311 Varicose veins of right lower extremity with inflammation: Secondary | ICD-10-CM | POA: Diagnosis not present

## 2023-10-18 DIAGNOSIS — L989 Disorder of the skin and subcutaneous tissue, unspecified: Secondary | ICD-10-CM | POA: Diagnosis not present

## 2023-10-18 DIAGNOSIS — I872 Venous insufficiency (chronic) (peripheral): Secondary | ICD-10-CM | POA: Diagnosis not present

## 2023-10-18 DIAGNOSIS — D225 Melanocytic nevi of trunk: Secondary | ICD-10-CM | POA: Diagnosis not present

## 2023-10-18 DIAGNOSIS — Z85828 Personal history of other malignant neoplasm of skin: Secondary | ICD-10-CM | POA: Diagnosis not present

## 2023-10-18 DIAGNOSIS — D2271 Melanocytic nevi of right lower limb, including hip: Secondary | ICD-10-CM | POA: Diagnosis not present

## 2023-10-18 DIAGNOSIS — I8312 Varicose veins of left lower extremity with inflammation: Secondary | ICD-10-CM | POA: Diagnosis not present

## 2023-10-18 DIAGNOSIS — D692 Other nonthrombocytopenic purpura: Secondary | ICD-10-CM | POA: Diagnosis not present

## 2023-10-18 DIAGNOSIS — L570 Actinic keratosis: Secondary | ICD-10-CM | POA: Diagnosis not present

## 2023-10-18 DIAGNOSIS — L821 Other seborrheic keratosis: Secondary | ICD-10-CM | POA: Diagnosis not present

## 2023-10-24 ENCOUNTER — Other Ambulatory Visit: Payer: Self-pay | Admitting: Emergency Medicine

## 2023-10-30 ENCOUNTER — Telehealth: Payer: Self-pay | Admitting: Emergency Medicine

## 2023-10-30 NOTE — Telephone Encounter (Signed)
PT calling for Antibx and Pred. Issues he has now  that comes on a few times a year. No immediate appts avail. His # is [phone]  Ilda Basset is [facility] on [address].

## 2023-10-30 NOTE — Telephone Encounter (Signed)
Please advise 

## 2023-10-30 NOTE — Telephone Encounter (Signed)
Please send:  Pred > Take 40mg  daily for 3 days, then 30mg  daily for 3 days, then 20mg  daily for 3 days, then 10mg  daily for 3 days, then stop Doxycycline 100mg  bid x 7 days

## 2023-10-31 MED ORDER — DOXYCYCLINE HYCLATE 100 MG PO TABS: 100.0000 mg | ORAL_TABLET | Freq: Two times a day (BID) | ORAL | 0 refills | Status: DC

## 2023-10-31 MED ORDER — PREDNISONE 10 MG PO TABS: ORAL_TABLET | ORAL | 0 refills | Status: DC

## 2023-10-31 NOTE — Telephone Encounter (Signed)
Called and spoke with pt about sending in prescription. Pt confirmed pharmacy, and verbalized understanding, nfn.

## 2023-11-08 DIAGNOSIS — D485 Neoplasm of uncertain behavior of skin: Secondary | ICD-10-CM | POA: Diagnosis not present

## 2023-11-08 DIAGNOSIS — Z85828 Personal history of other malignant neoplasm of skin: Secondary | ICD-10-CM | POA: Diagnosis not present

## 2023-11-08 DIAGNOSIS — L988 Other specified disorders of the skin and subcutaneous tissue: Secondary | ICD-10-CM | POA: Diagnosis not present

## 2023-12-20 DIAGNOSIS — L84 Corns and callosities: Secondary | ICD-10-CM | POA: Diagnosis not present

## 2023-12-20 DIAGNOSIS — E1151 Type 2 diabetes mellitus with diabetic peripheral angiopathy without gangrene: Secondary | ICD-10-CM | POA: Diagnosis not present

## 2023-12-20 DIAGNOSIS — L603 Nail dystrophy: Secondary | ICD-10-CM | POA: Diagnosis not present

## 2023-12-20 DIAGNOSIS — I739 Peripheral vascular disease, unspecified: Secondary | ICD-10-CM | POA: Diagnosis not present

## 2024-01-03 DIAGNOSIS — S0001XA Abrasion of scalp, initial encounter: Secondary | ICD-10-CM | POA: Diagnosis not present

## 2024-01-03 DIAGNOSIS — S0101XA Laceration without foreign body of scalp, initial encounter: Secondary | ICD-10-CM | POA: Diagnosis not present

## 2024-01-03 DIAGNOSIS — I10 Essential (primary) hypertension: Secondary | ICD-10-CM | POA: Diagnosis not present

## 2024-01-03 DIAGNOSIS — Z7902 Long term (current) use of antithrombotics/antiplatelets: Secondary | ICD-10-CM | POA: Diagnosis not present

## 2024-01-03 DIAGNOSIS — J449 Chronic obstructive pulmonary disease, unspecified: Secondary | ICD-10-CM | POA: Diagnosis not present

## 2024-01-03 DIAGNOSIS — E119 Type 2 diabetes mellitus without complications: Secondary | ICD-10-CM | POA: Diagnosis not present

## 2024-01-03 DIAGNOSIS — Z7984 Long term (current) use of oral hypoglycemic drugs: Secondary | ICD-10-CM | POA: Diagnosis not present

## 2024-01-03 DIAGNOSIS — F17210 Nicotine dependence, cigarettes, uncomplicated: Secondary | ICD-10-CM | POA: Diagnosis not present

## 2024-01-03 DIAGNOSIS — Z23 Encounter for immunization: Secondary | ICD-10-CM | POA: Diagnosis not present

## 2024-01-03 DIAGNOSIS — S0990XA Unspecified injury of head, initial encounter: Secondary | ICD-10-CM | POA: Diagnosis not present

## 2024-01-03 DIAGNOSIS — G44319 Acute post-traumatic headache, not intractable: Secondary | ICD-10-CM | POA: Diagnosis not present

## 2024-03-20 DIAGNOSIS — L603 Nail dystrophy: Secondary | ICD-10-CM | POA: Diagnosis not present

## 2024-03-20 DIAGNOSIS — I739 Peripheral vascular disease, unspecified: Secondary | ICD-10-CM | POA: Diagnosis not present

## 2024-03-20 DIAGNOSIS — E1151 Type 2 diabetes mellitus with diabetic peripheral angiopathy without gangrene: Secondary | ICD-10-CM | POA: Diagnosis not present

## 2024-03-20 DIAGNOSIS — L84 Corns and callosities: Secondary | ICD-10-CM | POA: Diagnosis not present

## 2024-03-22 ENCOUNTER — Other Ambulatory Visit: Payer: Self-pay | Admitting: Emergency Medicine

## 2024-03-22 DIAGNOSIS — J309 Allergic rhinitis, unspecified: Secondary | ICD-10-CM

## 2024-04-18 DIAGNOSIS — L570 Actinic keratosis: Secondary | ICD-10-CM | POA: Diagnosis not present

## 2024-04-18 DIAGNOSIS — L308 Other specified dermatitis: Secondary | ICD-10-CM | POA: Diagnosis not present

## 2024-04-18 DIAGNOSIS — L814 Other melanin hyperpigmentation: Secondary | ICD-10-CM | POA: Diagnosis not present

## 2024-04-18 DIAGNOSIS — L821 Other seborrheic keratosis: Secondary | ICD-10-CM | POA: Diagnosis not present

## 2024-04-18 DIAGNOSIS — D485 Neoplasm of uncertain behavior of skin: Secondary | ICD-10-CM | POA: Diagnosis not present

## 2024-04-18 DIAGNOSIS — C44622 Squamous cell carcinoma of skin of right upper limb, including shoulder: Secondary | ICD-10-CM | POA: Diagnosis not present

## 2024-04-18 DIAGNOSIS — I872 Venous insufficiency (chronic) (peripheral): Secondary | ICD-10-CM | POA: Diagnosis not present

## 2024-04-18 DIAGNOSIS — I8312 Varicose veins of left lower extremity with inflammation: Secondary | ICD-10-CM | POA: Diagnosis not present

## 2024-04-18 DIAGNOSIS — I8311 Varicose veins of right lower extremity with inflammation: Secondary | ICD-10-CM | POA: Diagnosis not present

## 2024-04-18 DIAGNOSIS — D225 Melanocytic nevi of trunk: Secondary | ICD-10-CM | POA: Diagnosis not present

## 2024-06-19 ENCOUNTER — Ambulatory Visit (INDEPENDENT_AMBULATORY_CARE_PROVIDER_SITE_OTHER): Admitting: Podiatry

## 2024-06-19 DIAGNOSIS — M79671 Pain in right foot: Secondary | ICD-10-CM

## 2024-06-19 DIAGNOSIS — M79672 Pain in left foot: Secondary | ICD-10-CM | POA: Diagnosis not present

## 2024-06-19 DIAGNOSIS — B351 Tinea unguium: Secondary | ICD-10-CM

## 2024-06-19 NOTE — Progress Notes (Signed)
 Patient presents for evaluation and treatment of tenderness and some redness around nails feet.  Tenderness around toes with walking and wearing shoes.  Physical exam:  General appearance: Alert, pleasant, and in no acute distress.  Vascular: Pedal pulses: DP 2/4 B/L, PT 0/4 B/L.  Moderate edema lower legs bilaterally  Neurological:    Dermatologic:  Nails thickened, disfigured, discolored 1-5 BL with subungual debris.  Redness and hypertrophic nail folds along nail folds bilaterally but no signs of drainage or infection.  Musculoskeletal:     Diagnosis: 1. Painful onychomycotic nails 1 through 5 bilaterally. 2. Pain toes 1 through 5 bilaterally.  Plan: Debrided onychomycotic nails 1 through 5 bilaterally.  Return 3 months [facility]

## 2024-06-27 ENCOUNTER — Ambulatory Visit (INDEPENDENT_AMBULATORY_CARE_PROVIDER_SITE_OTHER): Admitting: Pulmonary Disease

## 2024-06-27 VITALS — BP 136/60 | HR 65 | Wt 214.0 lb

## 2024-06-27 DIAGNOSIS — Z87891 Personal history of nicotine dependence: Secondary | ICD-10-CM | POA: Diagnosis not present

## 2024-06-27 DIAGNOSIS — J439 Emphysema, unspecified: Secondary | ICD-10-CM | POA: Diagnosis not present

## 2024-06-27 DIAGNOSIS — J4489 Other specified chronic obstructive pulmonary disease: Secondary | ICD-10-CM

## 2024-06-27 NOTE — Progress Notes (Signed)
 Sean Saunders    [id number]    11-20-1946  Primary Care Physician:[facility]  Referring Physician: [facility], [facility] [address] [facility] [place],  [place] [address]  Chief complaint:   Being seen for follow-up  HPI:  History of obstructive lung disease  Cough, congestion  Chronic mucus production, not feeling acutely ill Recently completed a course of antibiotics that was prescribed secondary to a cellulitis of his shin  He does get short of breath with most activities Persistent wheezing  Reformed smoker with severe COPD at baseline,, history of chronic respiratory failure  History of diabetes, peripheral vascular disease  He has advanced centrilobular emphysema on last CT scan 08/26/2022-films were reviewed myself  Compliant with inhalers -Uses Trelegy - Albuterol  as needed  Does have allergies for which she is on Zyrtec , Singulair , Flonase   Outpatient Encounter Medications as of 06/27/2024  Medication Sig   albuterol  (VENTOLIN  HFA) 108 (90 Base) MCG/ACT inhaler Inhale 2 puffs into the lungs every 6 (six) hours as needed for wheezing or shortness of breath.   allopurinol  (ZYLOPRIM ) 100 MG tablet Take 100 mg by mouth 2 (two) times daily.   amLODipine  (NORVASC ) 10 MG tablet Take 10 mg by mouth daily.   benazepril  (LOTENSIN ) 40 MG tablet Take 40 mg by mouth daily.   carvedilol  (COREG ) 12.5 MG tablet Take 12.5 mg by mouth 2 (two) times daily with a meal.   cetirizine  (ZYRTEC ) 10 MG tablet TAKE 1 TABLET(10 MG) BY MOUTH DAILY   clopidogrel  (PLAVIX ) 75 MG tablet Take 75 mg by mouth daily.   empagliflozin  (JARDIANCE ) 25 MG TABS tablet Take 25 mg by mouth daily.   fluticasone  (FLONASE ) 50 MCG/ACT nasal spray Place 2 sprays into both nostrils daily.   furosemide  (LASIX ) 20 MG tablet Take 20 mg by mouth daily.   gabapentin  (NEURONTIN ) 300 MG capsule Take 1 capsule (300 mg total) by mouth 3 (three) times daily.   insulin  aspart  (NOVOLOG  FLEXPEN) 100 UNIT/ML FlexPen Inject 40 Units into the skin 3 (three) times daily with meals.   insulin  glargine (LANTUS ) 100 UNIT/ML injection Inject 65 Units into the skin in the morning.   Ipratropium-Albuterol  (COMBIVENT RESPIMAT) 20-100 MCG/ACT AERS respimat Inhale 1 puff into the lungs 2 (two) times daily as needed for wheezing or shortness of breath.   ipratropium-albuterol  (DUONEB) 0.5-2.5 (3) MG/3ML SOLN Take 3 mLs by nebulization every 6 (six) hours as needed (sob & wheezing).   latanoprost  (XALATAN ) 0.005 % ophthalmic solution Place 1 drop into both eyes at bedtime.   levothyroxine  (SYNTHROID ) 125 MCG tablet Take 125 mcg by mouth daily before breakfast.   metFORMIN  (GLUCOPHAGE ) 500 MG tablet Take 500 mg by mouth 2 (two) times daily with a meal.   montelukast  (SINGULAIR ) 10 MG tablet TAKE 1 TABLET(10 MG) BY MOUTH AT BEDTIME   Multiple Vitamin (MULTIVITAMIN WITH MINERALS) TABS tablet Take 1 tablet by mouth daily.   rOPINIRole  (REQUIP ) 0.25 MG tablet Take 0.25 mg by mouth at bedtime.   rosuvastatin  (CRESTOR ) 40 MG tablet Take 20 mg by mouth in the morning and at bedtime.   tamsulosin  (FLOMAX ) 0.4 MG CAPS capsule Take 0.4 mg by mouth in the morning and at bedtime.   azithromycin  (ZITHROMAX  Z-PAK) 250 MG tablet Take 2 tablets day 1 and then 1 daily for 4 days (Patient not taking: Reported on 06/27/2024)   azithromycin  (ZITHROMAX ) 250 MG tablet Take 1 tablet (250 mg total) by mouth daily. (Patient not  taking: Reported on 06/27/2024)   azithromycin  (ZITHROMAX ) 250 MG tablet TAKE 1 TABLET BY MOUTH EVERY DAY (Patient not taking: Reported on 06/27/2024)   azithromycin  (ZITHROMAX ) 250 MG tablet TAKE 1 TABLET BY MOUTH EVERY DAY (Patient not taking: Reported on 06/27/2024)   doxycycline  (VIBRA -TABS) 100 MG tablet Take 1 tablet (100 mg total) by mouth 2 (two) times daily. (Patient not taking: Reported on 06/27/2024)   predniSONE  (DELTASONE ) 10 MG tablet Take 4 tabs by mouth for 3 days, then 3 for 3  days, 2 for 3 days, 1 for 3 days and stop (Patient not taking: Reported on 06/27/2024)   predniSONE  (DELTASONE ) 20 MG tablet Take 1 tablet (20 mg total) by mouth daily with breakfast. (Patient not taking: Reported on 06/27/2024)   No facility-administered encounter medications on file as of 06/27/2024.    Allergies as of 06/27/2024 - Review Complete 06/27/2024  Allergen Reaction Noted   Eszopiclone Itching 08/02/2011   Niaspan [niacin er (antihyperlipidemic)] Nausea And Vomiting and Rash 08/02/2011   Finasteride  08/09/2022   Niacin Other (See Comments) 05/31/2023   Other Hives and Nausea And Vomiting 10/11/2019    Past Medical History:  Diagnosis Date   Arthritis    COPD (chronic obstructive pulmonary disease) (HCC)    Diabetes mellitus    DVT (deep venous thrombosis) (HCC)    GERD (gastroesophageal reflux disease)    Hyperlipidemia    Hypertension    Hypothyroidism    Onychomycosis 06/07/2013   Stroke (HCC)    TIA's 01/05/11-01/08/11-01/26/11   TIA (transient ischemic attack) 08/03/2011   Left sided weakness. 3 episodes Feb-March    Past Surgical History:  Procedure Laterality Date   ABDOMINAL AORTAGRAM N/A 06/04/2012   Procedure: ABDOMINAL EZELLA;  Surgeon: Lonni GORMAN Blade, MD;  Location: [facility] CATH LAB;  Service: Cardiovascular;  Laterality: N/A;   APPENDECTOMY     COLONOSCOPY     FEMORAL-POPLITEAL BYPASS GRAFT  07/05/2012   Procedure: BYPASS GRAFT FEMORAL-POPLITEAL ARTERY;  Surgeon: Lonni GORMAN Blade, MD;  Location: [facility] OR;  Service: Vascular;  Laterality: Left;  Revision Left fem/pop w/ artery patch angioplasty utilizing left GSV graft and extenion to tib/per trunk w/right GSV and interop arteriogram   JOINT REPLACEMENT  2011   -Hip   NASAL SINUS SURGERY     PR VEIN BYPASS GRAFT,AORTO-FEM-POP     PR VEIN BYPASS GRAFT,AORTO-FEM-POP  08-01-11   (L) Fem-pop BP   toe nail     TONSILLECTOMY      Family History  Problem Relation Age of Onset   Stroke Mother     Diabetes Mother    Other Father        alzheimers   Diabetes Father    Stroke Father    Diabetes Sister     Social History   Socioeconomic History   Marital status: Widowed    Spouse name: Not on file   Number of children: 1   Years of education: Not on file   Highest education level: Master's degree (e.g., MA, MS, MEng, MEd, MSW, MBA)  Occupational History   Occupation: retired  Tobacco Use   Smoking status: Former    Current packs/day: 0.00    Average packs/day: 1 pack/day for 25.0 years (25.0 ttl pk-yrs)    Types: Cigarettes    Start date: 06/05/1993    Quit date: 06/05/2018    Years since quitting: 6.0   Smokeless tobacco: Never  Vaping Use   Vaping status: Never Used  Substance and Sexual Activity  Alcohol use: Not Currently    Alcohol/week: 0.0 standard drinks of alcohol    Comment: rare   Drug use: No   Sexual activity: Not on file  Other Topics Concern   Not on file  Social History Narrative   Not on file   Social Drivers of Health   Financial Resource Strain: Low Risk  (08/25/2020)   Overall Financial Resource Strain (CARDIA)    Difficulty of Paying Living Expenses: Not hard at all  Food Insecurity: No Food Insecurity (06/01/2023)   Hunger Vital Sign    Worried About Running Out of Food in the Last Year: Never true    Ran Out of Food in the Last Year: Never true  Transportation Needs: No Transportation Needs (06/01/2023)   PRAPARE - Administrator, Civil Service (Medical): No    Lack of Transportation (Non-Medical): No  Physical Activity: Inactive (08/25/2020)   Exercise Vital Sign    Days of Exercise per Week: 0 days    Minutes of Exercise per Session: 0 min  Stress: No Stress Concern Present (08/25/2020)   [employer] of Occupational Health - Occupational Stress Questionnaire    Feeling of Stress : Only a little  Social Connections: Moderately Isolated (08/25/2020)   Social Connection and Isolation Panel    Frequency of Communication  with Friends and Family: More than three times a week    Frequency of Social Gatherings with Friends and Family: Twice a week    Attends Religious Services: More than 4 times per year    Active Member of [employer] or Organizations: No    Attends Banker Meetings: Never    Marital Status: Widowed  Intimate Partner Violence: Not At Risk (01/03/2024)   Received from [facility]   HITS    Over the last 12 months how often did your partner physically hurt you?: Never    Over the last 12 months how often did your partner insult you or talk down to you?: Never    Over the last 12 months how often did your partner threaten you with physical harm?: Never    Over the last 12 months how often did your partner scream or curse at you?: Never    Review of Systems  Constitutional:  Positive for fatigue.  Respiratory:  Positive for cough, shortness of breath and wheezing.     Vitals:   06/27/24 1409 06/27/24 1413  BP: (!) 145/64 136/60  Pulse:    SpO2:       Physical Exam Constitutional:      Appearance: Normal appearance.  HENT:     Mouth/Throat:     Mouth: Mucous membranes are moist.  Eyes:     General: No scleral icterus. Cardiovascular:     Rate and Rhythm: Regular rhythm.     Heart sounds: No murmur heard.    No friction rub.  Pulmonary:     Effort: No respiratory distress.     Breath sounds: No stridor. Rhonchi present. No wheezing.  Musculoskeletal:        General: No swelling.     Cervical back: No rigidity or tenderness.  Neurological:     Mental Status: He is alert.  Psychiatric:        Mood and Affect: Mood normal.    Data Reviewed: PFT from 05/22/2018 with severe obstructive disease-reviewed  CT scan 08/26/2022 with centrilobular emphysema-reviewed the films  Assessment:  Severe COPD Does not appear acutely exacerbated at present but does have  significant background symptoms of cough and congestion with mucus production  Almost always  wheezes  History of chronic respiratory failure  Type 2 diabetes  Peripheral arterial disease    Plan/Recommendations: Continue Trelegy  Continue nebulizer use  Encouraged to use his flutter device on a regular basis to help mucus clearance  I did discuss the addition of Ensifentrine  to his medications, he does not want to consider this at present  Follow-up in 6 months  Encouraged to call if any significant concerns   Jennet Epley MD [facility] 06/27/2024, 2:23 PM  CC: [facility], [facility]

## 2024-06-27 NOTE — Patient Instructions (Signed)
 Continue using your nebulizer on a regular basis  Continue bronchodilators  The medication I mentioned to you is called Ensifentrine -this is a nebulized medication that can be added to your current medication to help with your COPD symptoms  Call us  with significant concerns  Follow-up in 6 months

## 2024-07-11 ENCOUNTER — Other Ambulatory Visit: Payer: Self-pay

## 2024-07-11 ENCOUNTER — Emergency Department (HOSPITAL_COMMUNITY)

## 2024-07-11 ENCOUNTER — Encounter (HOSPITAL_COMMUNITY): Payer: Self-pay | Admitting: Emergency Medicine

## 2024-07-11 ENCOUNTER — Inpatient Hospital Stay (HOSPITAL_COMMUNITY)
Admission: EM | Admit: 2024-07-11 | Discharge: 2024-07-13 | DRG: 638 | Disposition: A | Discharge status: Home Health | Attending: Family Medicine | Admitting: Family Medicine

## 2024-07-11 DIAGNOSIS — I70221 Atherosclerosis of native arteries of extremities with rest pain, right leg: Secondary | ICD-10-CM | POA: Diagnosis present

## 2024-07-11 DIAGNOSIS — Z888 Allergy status to other drugs, medicaments and biological substances status: Secondary | ICD-10-CM

## 2024-07-11 DIAGNOSIS — N183 Chronic kidney disease, stage 3 unspecified: Secondary | ICD-10-CM | POA: Diagnosis present

## 2024-07-11 DIAGNOSIS — I129 Hypertensive chronic kidney disease with stage 1 through stage 4 chronic kidney disease, or unspecified chronic kidney disease: Secondary | ICD-10-CM | POA: Diagnosis present

## 2024-07-11 DIAGNOSIS — E039 Hypothyroidism, unspecified: Secondary | ICD-10-CM | POA: Diagnosis present

## 2024-07-11 DIAGNOSIS — L0390 Cellulitis, unspecified: Secondary | ICD-10-CM | POA: Diagnosis present

## 2024-07-11 DIAGNOSIS — F17200 Nicotine dependence, unspecified, uncomplicated: Secondary | ICD-10-CM | POA: Diagnosis present

## 2024-07-11 DIAGNOSIS — I69354 Hemiplegia and hemiparesis following cerebral infarction affecting left non-dominant side: Secondary | ICD-10-CM

## 2024-07-11 DIAGNOSIS — Z833 Family history of diabetes mellitus: Secondary | ICD-10-CM

## 2024-07-11 DIAGNOSIS — N1831 Chronic kidney disease, stage 3a: Secondary | ICD-10-CM | POA: Diagnosis not present

## 2024-07-11 DIAGNOSIS — I70211 Atherosclerosis of native arteries of extremities with intermittent claudication, right leg: Secondary | ICD-10-CM

## 2024-07-11 DIAGNOSIS — L03115 Cellulitis of right lower limb: Principal | ICD-10-CM | POA: Diagnosis present

## 2024-07-11 DIAGNOSIS — Z79899 Other long term (current) drug therapy: Secondary | ICD-10-CM

## 2024-07-11 DIAGNOSIS — E11628 Type 2 diabetes mellitus with other skin complications: Principal | ICD-10-CM | POA: Diagnosis present

## 2024-07-11 DIAGNOSIS — J449 Chronic obstructive pulmonary disease, unspecified: Secondary | ICD-10-CM | POA: Diagnosis present

## 2024-07-11 DIAGNOSIS — Z79890 Hormone replacement therapy (postmenopausal): Secondary | ICD-10-CM

## 2024-07-11 DIAGNOSIS — L089 Local infection of the skin and subcutaneous tissue, unspecified: Secondary | ICD-10-CM

## 2024-07-11 DIAGNOSIS — Z86718 Personal history of other venous thrombosis and embolism: Secondary | ICD-10-CM

## 2024-07-11 DIAGNOSIS — R112 Nausea with vomiting, unspecified: Secondary | ICD-10-CM | POA: Diagnosis not present

## 2024-07-11 DIAGNOSIS — Z794 Long term (current) use of insulin: Secondary | ICD-10-CM

## 2024-07-11 DIAGNOSIS — I119 Hypertensive heart disease without heart failure: Secondary | ICD-10-CM | POA: Diagnosis not present

## 2024-07-11 DIAGNOSIS — Z8673 Personal history of transient ischemic attack (TIA), and cerebral infarction without residual deficits: Secondary | ICD-10-CM

## 2024-07-11 DIAGNOSIS — E1122 Type 2 diabetes mellitus with diabetic chronic kidney disease: Secondary | ICD-10-CM | POA: Diagnosis present

## 2024-07-11 DIAGNOSIS — Z7952 Long term (current) use of systemic steroids: Secondary | ICD-10-CM

## 2024-07-11 DIAGNOSIS — E1140 Type 2 diabetes mellitus with diabetic neuropathy, unspecified: Secondary | ICD-10-CM | POA: Diagnosis present

## 2024-07-11 DIAGNOSIS — Z66 Do not resuscitate: Secondary | ICD-10-CM | POA: Diagnosis present

## 2024-07-11 DIAGNOSIS — E785 Hyperlipidemia, unspecified: Secondary | ICD-10-CM | POA: Diagnosis present

## 2024-07-11 DIAGNOSIS — K219 Gastro-esophageal reflux disease without esophagitis: Secondary | ICD-10-CM | POA: Diagnosis present

## 2024-07-11 DIAGNOSIS — T148XXA Other injury of unspecified body region, initial encounter: Secondary | ICD-10-CM | POA: Diagnosis not present

## 2024-07-11 DIAGNOSIS — Z823 Family history of stroke: Secondary | ICD-10-CM

## 2024-07-11 DIAGNOSIS — E1151 Type 2 diabetes mellitus with diabetic peripheral angiopathy without gangrene: Secondary | ICD-10-CM | POA: Diagnosis present

## 2024-07-11 DIAGNOSIS — Z7902 Long term (current) use of antithrombotics/antiplatelets: Secondary | ICD-10-CM

## 2024-07-11 DIAGNOSIS — I739 Peripheral vascular disease, unspecified: Secondary | ICD-10-CM | POA: Diagnosis present

## 2024-07-11 DIAGNOSIS — Z7984 Long term (current) use of oral hypoglycemic drugs: Secondary | ICD-10-CM

## 2024-07-11 DIAGNOSIS — Z820 Family history of epilepsy and other diseases of the nervous system: Secondary | ICD-10-CM

## 2024-07-11 DIAGNOSIS — I70219 Atherosclerosis of native arteries of extremities with intermittent claudication, unspecified extremity: Secondary | ICD-10-CM | POA: Diagnosis present

## 2024-07-11 LAB — CBC WITH DIFFERENTIAL/PLATELET
Abs Immature Granulocytes: 0.01 K/uL (ref 0.00–0.07)
Basophils Absolute: 0.1 K/uL (ref 0.0–0.1)
Basophils Relative: 1 %
Eosinophils Absolute: 0.4 K/uL (ref 0.0–0.5)
Eosinophils Relative: 5 %
HCT: 43.3 % (ref 39.0–52.0)
Hemoglobin: 13.5 g/dL (ref 13.0–17.0)
Immature Granulocytes: 0 %
Lymphocytes Relative: 34 %
Lymphs Abs: 2.7 K/uL (ref 0.7–4.0)
MCH: 27.7 pg (ref 26.0–34.0)
MCHC: 31.2 g/dL (ref 30.0–36.0)
MCV: 88.7 fL (ref 80.0–100.0)
Monocytes Absolute: 0.6 K/uL (ref 0.1–1.0)
Monocytes Relative: 8 %
Neutro Abs: 4.1 K/uL (ref 1.7–7.7)
Neutrophils Relative %: 52 %
Platelets: 187 K/uL (ref 150–400)
RBC: 4.88 MIL/uL (ref 4.22–5.81)
RDW: 15.1 % (ref 11.5–15.5)
WBC: 8 K/uL (ref 4.0–10.5)
nRBC: 0 % (ref 0.0–0.2)

## 2024-07-11 LAB — COMPREHENSIVE METABOLIC PANEL WITH GFR
ALT: 14 U/L (ref 0–44)
AST: 14 U/L — ABNORMAL LOW (ref 15–41)
Albumin: 3.2 g/dL — ABNORMAL LOW (ref 3.5–5.0)
Alkaline Phosphatase: 98 U/L (ref 38–126)
Anion gap: 9 (ref 5–15)
BUN: 24 mg/dL — ABNORMAL HIGH (ref 8–23)
CO2: 22 mmol/L (ref 22–32)
Calcium: 8.8 mg/dL — ABNORMAL LOW (ref 8.9–10.3)
Chloride: 106 mmol/L (ref 98–111)
Creatinine, Ser: 1.26 mg/dL — ABNORMAL HIGH (ref 0.61–1.24)
GFR, Estimated: 58 mL/min — ABNORMAL LOW (ref >60)
Glucose, Bld: 244 mg/dL — ABNORMAL HIGH (ref 70–99)
Potassium: 4.1 mmol/L (ref 3.5–5.1)
Sodium: 137 mmol/L (ref 135–145)
Total Bilirubin: 0.4 mg/dL (ref 0.0–1.2)
Total Protein: 6.4 g/dL — ABNORMAL LOW (ref 6.5–8.1)

## 2024-07-11 LAB — GLUCOSE, CAPILLARY: Glucose-Capillary: 352 mg/dL — ABNORMAL HIGH (ref 70–99)

## 2024-07-11 LAB — I-STAT CG4 LACTIC ACID, ED: Lactic Acid, Venous: 1.2 mmol/L (ref 0.5–1.9)

## 2024-07-11 LAB — C-REACTIVE PROTEIN: CRP: 1.1 mg/dL — ABNORMAL HIGH (ref <1.0)

## 2024-07-11 LAB — SEDIMENTATION RATE: Sed Rate: 15 mm/h (ref 0–16)

## 2024-07-11 MED ORDER — CARVEDILOL 12.5 MG PO TABS
12.5000 mg | ORAL_TABLET | Freq: Two times a day (BID) | ORAL | Status: DC
Start: 2024-07-12 — End: 2024-07-13
  Administered 2024-07-12 – 2024-07-13 (×3): 12.5 mg via ORAL
  Filled 2024-07-11 (×3): qty 1

## 2024-07-11 MED ORDER — SODIUM CHLORIDE 0.9 % IV SOLN
3.0000 g | Freq: Four times a day (QID) | INTRAVENOUS | Status: DC
  Administered 2024-07-11 – 2024-07-13 (×7): 3 g via INTRAVENOUS
  Filled 2024-07-11 (×8): qty 8

## 2024-07-11 MED ORDER — ADULT MULTIVITAMIN W/MINERALS CH
1.0000 | ORAL_TABLET | Freq: Every day | ORAL | Status: DC
  Administered 2024-07-12 – 2024-07-13 (×2): 1 via ORAL
  Filled 2024-07-11 (×2): qty 1

## 2024-07-11 MED ORDER — MONTELUKAST SODIUM 10 MG PO TABS
10.0000 mg | ORAL_TABLET | Freq: Every day | ORAL | Status: DC
Start: 2024-07-11 — End: 2024-07-13
  Administered 2024-07-11 – 2024-07-12 (×2): 10 mg via ORAL
  Filled 2024-07-11 (×2): qty 1

## 2024-07-11 MED ORDER — ALBUTEROL SULFATE (2.5 MG/3ML) 0.083% IN NEBU: 2.5000 mg | INHALATION_SOLUTION | Freq: Four times a day (QID) | RESPIRATORY_TRACT | Status: DC | PRN

## 2024-07-11 MED ORDER — AMLODIPINE BESYLATE 10 MG PO TABS
10.0000 mg | ORAL_TABLET | Freq: Every day | ORAL | Status: DC
  Administered 2024-07-12 – 2024-07-13 (×2): 10 mg via ORAL
  Filled 2024-07-11 (×2): qty 1

## 2024-07-11 MED ORDER — TAMSULOSIN HCL 0.4 MG PO CAPS
0.4000 mg | ORAL_CAPSULE | Freq: Every day | ORAL | Status: DC
  Administered 2024-07-12: 0.4 mg via ORAL
  Filled 2024-07-11: qty 1

## 2024-07-11 MED ORDER — ACETAMINOPHEN 325 MG PO TABS: 650.0000 mg | ORAL_TABLET | Freq: Four times a day (QID) | ORAL | Status: DC | PRN

## 2024-07-11 MED ORDER — SODIUM CHLORIDE 0.9 % IV SOLN
2.0000 g | Freq: Once | INTRAVENOUS | Status: AC
  Administered 2024-07-11: 2 g via INTRAVENOUS
  Filled 2024-07-11: qty 20

## 2024-07-11 MED ORDER — VANCOMYCIN HCL 1500 MG/300ML IV SOLN
1500.0000 mg | Freq: Once | INTRAVENOUS | Status: AC
  Administered 2024-07-11: 1500 mg via INTRAVENOUS
  Filled 2024-07-11: qty 300

## 2024-07-11 MED ORDER — LEVOTHYROXINE SODIUM 125 MCG PO TABS
125.0000 ug | ORAL_TABLET | Freq: Every day | ORAL | Status: DC
  Administered 2024-07-12 – 2024-07-13 (×2): 125 ug via ORAL
  Filled 2024-07-11 (×2): qty 1

## 2024-07-11 MED ORDER — LATANOPROST 0.005 % OP SOLN
1.0000 [drp] | Freq: Every day | OPHTHALMIC | Status: DC
  Administered 2024-07-12: 1 [drp] via OPHTHALMIC
  Filled 2024-07-11: qty 2.5

## 2024-07-11 MED ORDER — INSULIN ASPART 100 UNIT/ML IJ SOLN
0.0000 [IU] | Freq: Every day | INTRAMUSCULAR | Status: DC
  Administered 2024-07-11: 5 [IU] via SUBCUTANEOUS
  Administered 2024-07-12: 2 [IU] via SUBCUTANEOUS
  Filled 2024-07-11: qty 0.05

## 2024-07-11 MED ORDER — HYDROCODONE-ACETAMINOPHEN 5-325 MG PO TABS
1.0000 | ORAL_TABLET | ORAL | Status: DC | PRN
  Administered 2024-07-11 – 2024-07-12 (×2): 2 via ORAL
  Administered 2024-07-12 (×2): 1 via ORAL
  Filled 2024-07-11: qty 1
  Filled 2024-07-11: qty 2
  Filled 2024-07-11: qty 1
  Filled 2024-07-11 (×2): qty 2

## 2024-07-11 MED ORDER — FENTANYL CITRATE PF 50 MCG/ML IJ SOSY
12.5000 ug | PREFILLED_SYRINGE | INTRAMUSCULAR | Status: DC | PRN
  Administered 2024-07-11: 50 ug via INTRAVENOUS
  Filled 2024-07-11: qty 1

## 2024-07-11 MED ORDER — VANCOMYCIN HCL 1500 MG/300ML IV SOLN
1500.0000 mg | Freq: Every day | INTRAVENOUS | Status: DC
  Administered 2024-07-12: 1500 mg via INTRAVENOUS
  Filled 2024-07-11: qty 300

## 2024-07-11 MED ORDER — GABAPENTIN 300 MG PO CAPS: 300.0000 mg | ORAL_CAPSULE | Freq: Three times a day (TID) | ORAL | Status: DC

## 2024-07-11 MED ORDER — ROSUVASTATIN CALCIUM 20 MG PO TABS
20.0000 mg | ORAL_TABLET | Freq: Every day | ORAL | Status: DC
  Administered 2024-07-12 – 2024-07-13 (×2): 20 mg via ORAL
  Filled 2024-07-11 (×2): qty 1

## 2024-07-11 MED ORDER — IPRATROPIUM-ALBUTEROL 0.5-2.5 (3) MG/3ML IN SOLN: 3.0000 mL | Freq: Four times a day (QID) | RESPIRATORY_TRACT | Status: DC | PRN

## 2024-07-11 MED ORDER — ONDANSETRON HCL 4 MG PO TABS: 4.0000 mg | ORAL_TABLET | Freq: Four times a day (QID) | ORAL | Status: DC | PRN

## 2024-07-11 MED ORDER — CLOPIDOGREL BISULFATE 75 MG PO TABS
75.0000 mg | ORAL_TABLET | Freq: Every day | ORAL | Status: DC
  Administered 2024-07-12 – 2024-07-13 (×2): 75 mg via ORAL
  Filled 2024-07-11 (×2): qty 1

## 2024-07-11 MED ORDER — INSULIN ASPART 100 UNIT/ML IJ SOLN
0.0000 [IU] | Freq: Three times a day (TID) | INTRAMUSCULAR | Status: DC
  Administered 2024-07-12: 5 [IU] via SUBCUTANEOUS
  Administered 2024-07-12: 3 [IU] via SUBCUTANEOUS
  Administered 2024-07-13: 2 [IU] via SUBCUTANEOUS
  Filled 2024-07-11: qty 0.15

## 2024-07-11 MED ORDER — ACETAMINOPHEN 650 MG RE SUPP: 650.0000 mg | Freq: Four times a day (QID) | RECTAL | Status: DC | PRN

## 2024-07-11 MED ORDER — ONDANSETRON HCL 4 MG/2ML IJ SOLN
4.0000 mg | Freq: Four times a day (QID) | INTRAMUSCULAR | Status: DC | PRN
  Administered 2024-07-12: 4 mg via INTRAVENOUS
  Filled 2024-07-11: qty 2

## 2024-07-11 MED ORDER — INSULIN GLARGINE-YFGN 100 UNIT/ML ~~LOC~~ SOLN
30.0000 [IU] | Freq: Every day | SUBCUTANEOUS | Status: DC
  Administered 2024-07-11 – 2024-07-12 (×2): 30 [IU] via SUBCUTANEOUS
  Filled 2024-07-11 (×3): qty 0.3

## 2024-07-11 NOTE — Assessment & Plan Note (Signed)
 On Plavix  and Crestor  20 mg

## 2024-07-11 NOTE — Plan of Care (Signed)
  Problem: Education: Goal: Ability to describe self-care measures that may prevent or decrease complications (Diabetes Survival Skills Education) will improve Outcome: Progressing Goal: Individualized Educational Video(s) Outcome: Progressing   Problem: Coping: Goal: Ability to adjust to condition or change in health will improve Outcome: Progressing   Problem: Fluid Volume: Goal: Ability to maintain a balanced intake and output will improve Outcome: Progressing   Problem: Health Behavior/Discharge Planning: Goal: Ability to identify and utilize available resources and services will improve Outcome: Progressing Goal: Ability to manage health-related needs will improve Outcome: Progressing   Problem: Metabolic: Goal: Ability to maintain appropriate glucose levels will improve Outcome: Progressing   Problem: Nutritional: Goal: Maintenance of adequate nutrition will improve Outcome: Progressing Goal: Progress toward achieving an optimal weight will improve Outcome: Progressing   Problem: Skin Integrity: Goal: Risk for impaired skin integrity will decrease Outcome: Progressing   Problem: Tissue Perfusion: Goal: Adequacy of tissue perfusion will improve Outcome: Progressing   Problem: Education: Goal: Knowledge of General Education information will improve Description: Including pain rating scale, medication(s)/side effects and non-pharmacologic comfort measures Outcome: Progressing   Problem: Clinical Measurements: Goal: Ability to maintain clinical measurements within normal limits will improve Outcome: Progressing Goal: Will remain free from infection Outcome: Progressing Goal: Diagnostic test results will improve Outcome: Progressing Goal: Respiratory complications will improve Outcome: Progressing Goal: Cardiovascular complication will be avoided Outcome: Progressing   Problem: Activity: Goal: Risk for activity intolerance will decrease Outcome: Progressing    Problem: Nutrition: Goal: Adequate nutrition will be maintained Outcome: Progressing

## 2024-07-11 NOTE — ED Triage Notes (Signed)
 Patient c/o wound infection on right leg. Patient report worsening redness around the affected area. Patient report she was seen at the [facility] and started antibiotic 4 days ago.  Patient denies N/V. Patient denies fever.

## 2024-07-11 NOTE — Assessment & Plan Note (Signed)
 On Plavix

## 2024-07-11 NOTE — Assessment & Plan Note (Signed)
 Order sliding scale Continue Lantus  decreased down to 30 units nightly

## 2024-07-11 NOTE — ED Provider Notes (Signed)
 [place] [facility] AT [facility] Provider Note   CSN: [id number] Arrival date & time: 07/11/24  1520     Patient presents with: Wound Infection   Sean Saunders is a 78 y.o. male who presents to the emergency department with a chief complaint of wound infection to his right leg.  Patient states that redness around the wound continues to worsen despite him being on antibiotics now for 3 days prescribed by the [facility].  Patient states that he has been compliant with his doxycycline  medication.  Denies nausea or vomiting, denies fever or chills.  Patient has past medical history significant for peripheral vascular disease, type 2 diabetes, hypertension, CKD stage III, stenosis of carotid artery, COPD, tobacco use disorder, cerebral meningioma, vestibular schwannoma, acute pancreatitis, embolism and thrombosis of arteries of lower extremity, acute respiratory failure with hypoxia, depression, cellulitis of right lower extremity, etc.   HPI     Prior to Admission medications   Medication Sig Start Date End Date Taking? Authorizing Provider  albuterol  (VENTOLIN  HFA) 108 (90 Base) MCG/ACT inhaler Inhale 2 puffs into the lungs every 6 (six) hours as needed for wheezing or shortness of breath.   Yes [provider]  allopurinol  (ZYLOPRIM ) 100 MG tablet Take 200 mg by mouth daily.   Yes [provider]  amLODipine  (NORVASC ) 10 MG tablet Take 10 mg by mouth daily.   Yes [provider]  benazepril  (LOTENSIN ) 40 MG tablet Take 40 mg by mouth daily. 03/27/23  Yes [provider]  Carboxymethylcellulose Sod PF 0.5 % SOLN Place 1 drop into both eyes 4 (four) times daily as needed (for dryness).   Yes [provider]  carvedilol  (COREG ) 12.5 MG tablet Take 12.5 mg by mouth 2 (two) times daily with a meal.   Yes [provider]  cetirizine  (ZYRTEC ) 10 MG tablet TAKE 1 TABLET(10 MG) BY MOUTH DAILY 07/11/21  Yes Byrum, Lamar RAMAN, MD   clopidogrel  (PLAVIX ) 75 MG tablet Take 75 mg by mouth daily.   Yes [provider]  doxycycline  (VIBRA -TABS) 100 MG tablet Take 100 mg by mouth 2 (two) times daily. 07/09/24 07/16/24 Yes [provider]  DULoxetine  (CYMBALTA ) 30 MG capsule Take 30 mg by mouth in the morning.   Yes [provider]  empagliflozin  (JARDIANCE ) 25 MG TABS tablet Take 25 mg by mouth daily.   Yes [provider]  fluticasone  (FLONASE ) 50 MCG/ACT nasal spray Place 2 sprays into both nostrils daily. Patient taking differently: Place 2 sprays into both nostrils 2 (two) times daily. 08/07/19  Yes [place], Betty G, MD  furosemide  (LASIX ) 20 MG tablet Take 20 mg by mouth daily.   Yes [provider]  insulin  aspart (NOVOLOG  FLEXPEN) 100 UNIT/ML FlexPen Inject 30 Units into the skin 3 (three) times daily before meals.   Yes [provider]  insulin  glargine (LANTUS ) 100 UNIT/ML injection Inject 30 Units into the skin in the morning.   Yes [provider]  Ipratropium-Albuterol  (COMBIVENT RESPIMAT) 20-100 MCG/ACT AERS respimat Inhale 1 puff into the lungs every 6 (six) hours as needed for wheezing or shortness of breath.   Yes [provider]  ipratropium-albuterol  (DUONEB) 0.5-2.5 (3) MG/3ML SOLN Take 3 mLs by nebulization every 6 (six) hours as needed (for shortness of breath or wheezing).   Yes [provider]  latanoprost  (XALATAN ) 0.005 % ophthalmic solution Place 1 drop into both eyes at bedtime.   Yes [provider]  levothyroxine  (SYNTHROID ) 125  MCG tablet Take 125 mcg by mouth daily before breakfast.   Yes [provider]  metFORMIN  (GLUCOPHAGE ) 500 MG tablet Take 500 mg by mouth 2 (two) times daily with a meal.   Yes [provider]  montelukast  (SINGULAIR ) 10 MG tablet TAKE 1 TABLET(10 MG) BY MOUTH AT BEDTIME 03/22/24  Yes Byrum, Robert S, MD  Multiple Vitamin (MULTIVITAMIN WITH MINERALS) TABS tablet Take 1 tablet by  mouth daily with breakfast.   Yes [provider]  mupirocin ointment (BACTROBAN) 2 % Apply 1 Application topically See admin instructions. Apply to the affected area daily as directed   Yes [provider]  pregabalin (LYRICA) 100 MG capsule Take 100 mg by mouth 2 (two) times daily.   Yes [provider]  rOPINIRole  (REQUIP ) 0.25 MG tablet Take 0.25 mg by mouth at bedtime.   Yes [provider]  rosuvastatin  (CRESTOR ) 40 MG tablet Take 20 mg by mouth in the morning. 04/06/20  Yes [provider]  tamsulosin  (FLOMAX ) 0.4 MG CAPS capsule Take 0.4 mg by mouth in the morning and at bedtime.   Yes [provider]  azithromycin  (ZITHROMAX  Z-PAK) 250 MG tablet Take 2 tablets day 1 and then 1 daily for 4 days Patient not taking: Reported on 07/11/2024 09/05/23   Neda Jennet LABOR, MD  azithromycin  (ZITHROMAX ) 250 MG tablet Take 1 tablet (250 mg total) by mouth daily. Patient not taking: Reported on 07/11/2024 06/05/23   [place], Camellia PARAS, DO  azithromycin  (ZITHROMAX ) 250 MG tablet TAKE 1 TABLET BY MOUTH EVERY DAY Patient not taking: Reported on 07/11/2024 09/15/23   Byrum, Robert S, MD  azithromycin  (ZITHROMAX ) 250 MG tablet TAKE 1 TABLET BY MOUTH EVERY DAY Patient not taking: Reported on 07/11/2024 10/24/23   Neda Jennet LABOR, MD  doxycycline  (VIBRA -TABS) 100 MG tablet Take 1 tablet (100 mg total) by mouth 2 (two) times daily. Patient not taking: No sig reported 10/31/23   Shelah Lamar RAMAN, MD  gabapentin  (NEURONTIN ) 300 MG capsule Take 1 capsule (300 mg total) by mouth 3 (three) times daily. Patient not taking: Reported on 07/11/2024 06/05/23 07/11/24  [place], Eric J, DO  predniSONE  (DELTASONE ) 10 MG tablet Take 4 tabs by mouth for 3 days, then 3 for 3 days, 2 for 3 days, 1 for 3 days and stop Patient not taking: No sig reported 10/31/23   Shelah Lamar RAMAN, MD  predniSONE  (DELTASONE ) 20 MG tablet Take 1 tablet (20 mg total) by mouth daily with  breakfast. Patient not taking: No sig reported 09/05/23   Neda Jennet LABOR, MD    Allergies: Eszopiclone, Niacin, Niaspan [niacin er (antihyperlipidemic)], and Ozempic (0.25 or 0.5 mg-dose) [semaglutide(0.25 or 0.5mg -dos)]    Review of Systems  Skin:  Positive for wound.    Updated Vital Signs BP (!) 161/64 (BP Location: Right Arm)   Pulse 60   Temp 98.4 F (36.9 C)   Resp 16   Ht 6' (1.829 m)   Wt 96.6 kg   SpO2 95%   BMI 28.89 kg/m   Physical Exam Vitals and nursing note reviewed.  Constitutional:      General: He is awake. He is not in acute distress.    Appearance: Normal appearance. He is not ill-appearing, toxic-appearing or diaphoretic.  Pulmonary:     Effort: Pulmonary effort is normal. No respiratory distress.  Musculoskeletal:        General: Normal range of motion.     Right lower leg: No edema.  Left lower leg: No edema.  Skin:    General: Skin is warm.     Capillary Refill: Capillary refill takes less than 2 seconds.     Findings: Lesion (Diabetic infected wound of right anterior shin, open, surrounding cellulitis, warm to touch) present.  Neurological:     General: No focal deficit present.     Mental Status: He is alert and oriented to person, place, and time.  Psychiatric:        Mood and Affect: Mood normal.        Behavior: Behavior normal. Behavior is cooperative.      (all labs ordered are listed, but only abnormal results are displayed) Labs Reviewed  COMPREHENSIVE METABOLIC PANEL WITH GFR - Abnormal; Notable for the following components:      Result Value   Glucose, Bld 244 (*)    BUN 24 (*)    Creatinine, Ser 1.26 (*)    Calcium  8.8 (*)    Total Protein 6.4 (*)    Albumin  3.2 (*)    AST 14 (*)    GFR, Estimated 58 (*)    All other components within normal limits  C-REACTIVE PROTEIN - Abnormal; Notable for the following components:   CRP 1.1 (*)    All other components within normal limits  GLUCOSE, CAPILLARY - Abnormal; Notable  for the following components:   Glucose-Capillary 352 (*)    All other components within normal limits  CULTURE, BLOOD (ROUTINE X 2)  CULTURE, BLOOD (ROUTINE X 2)  MRSA NEXT GEN BY PCR, NASAL  CBC WITH DIFFERENTIAL/PLATELET  SEDIMENTATION RATE  HEMOGLOBIN A1C  PREALBUMIN  MAGNESIUM   PHOSPHORUS  COMPREHENSIVE METABOLIC PANEL WITH GFR  CBC  I-STAT CG4 LACTIC ACID, ED  I-STAT CG4 LACTIC ACID, ED    EKG: None  Radiology: DG Tibia/Fibula Right Result Date: 07/11/2024 CLINICAL DATA:  Patient banged his shin, osteomyelitis rule out EXAM: RIGHT TIBIA AND FIBULA - 2 VIEW COMPARISON:  June 01, 2023 FINDINGS: No acute fracture or dislocation. There is no evidence of arthropathy or other focal bone abnormality. Soft tissues are unremarkable. IMPRESSION: No acute fracture or dislocation. No radiographic findings of osteomyelitis, at this time. Electronically Signed   By: Rogelia Myers M.D.   On: 07/11/2024 16:31     Procedures   Medications Ordered in the ED  insulin  aspart (novoLOG ) injection 0-5 Units (5 Units Subcutaneous Given 07/11/24 2216)  insulin  aspart (novoLOG ) injection 0-15 Units (has no administration in time range)  Ampicillin -Sulbactam (UNASYN ) 3 g in sodium chloride  0.9 % 100 mL IVPB (3 g Intravenous New Bag/Given 07/11/24 2111)  amLODipine  (NORVASC ) tablet 10 mg (has no administration in time range)  carvedilol  (COREG ) tablet 12.5 mg (has no administration in time range)  rosuvastatin  (CRESTOR ) tablet 20 mg (has no administration in time range)  insulin  glargine-yfgn (SEMGLEE ) injection 30 Units (30 Units Subcutaneous Given 07/11/24 2217)  levothyroxine  (SYNTHROID ) tablet 125 mcg (has no administration in time range)  tamsulosin  (FLOMAX ) capsule 0.4 mg (has no administration in time range)  clopidogrel  (PLAVIX ) tablet 75 mg (has no administration in time range)  multivitamin with minerals tablet 1 tablet (has no administration in time range)  albuterol  (PROVENTIL ) (2.5  MG/3ML) 0.083% nebulizer solution 2.5 mg (has no administration in time range)  ipratropium-albuterol  (DUONEB) 0.5-2.5 (3) MG/3ML nebulizer solution 3 mL (has no administration in time range)  montelukast  (SINGULAIR ) tablet 10 mg (10 mg Oral Given 07/11/24 2217)  latanoprost  (XALATAN ) 0.005 % ophthalmic solution 1 drop (has no  administration in time range)  acetaminophen  (TYLENOL ) tablet 650 mg (has no administration in time range)    Or  acetaminophen  (TYLENOL ) suppository 650 mg (has no administration in time range)  HYDROcodone -acetaminophen  (NORCO/VICODIN) 5-325 MG per tablet 1-2 tablet (2 tablets Oral Given 07/11/24 2011)  ondansetron  (ZOFRAN ) tablet 4 mg (has no administration in time range)    Or  ondansetron  (ZOFRAN ) injection 4 mg (has no administration in time range)  fentaNYL  (SUBLIMAZE ) injection 12.5-50 mcg (50 mcg Intravenous Given 07/11/24 2217)  vancomycin  (VANCOREADY) IVPB 1500 mg/300 mL (has no administration in time range)  cefTRIAXone  (ROCEPHIN ) 2 g in sodium chloride  0.9 % 100 mL IVPB (0 g Intravenous Stopped 07/11/24 1759)  vancomycin  (VANCOREADY) IVPB 1500 mg/300 mL (1,500 mg Intravenous New Bag/Given 07/11/24 1903)    Clinical Course as of 07/11/24 2328  Thu Jul 11, 2024  1801 Labs and vitals reassuring however due to failed outpatient abx therapy will give IV dose here and attempt to admit to prevent deterioration [CH]    Clinical Course User Index [CH] Tamey Wanek, [place] FALCON, PA-C                                 Medical Decision Making Amount and/or Complexity of Data Reviewed Labs: ordered. Radiology: ordered.  Risk Decision regarding hospitalization.   Patient presents to the ED for concern of diabetic right lower extremity infection, this involves an extensive number of treatment options, and is a complaint that carries with it a high risk of complications and morbidity.  The differential diagnosis includes diabetic foot infection, MRSA infection, trauma/injury,  sepsis, necrotizing fasciitis, etc.   Co morbidities that complicate the patient evaluation  peripheral vascular disease, type 2 diabetes, hypertension, CKD stage III, stenosis of carotid artery, COPD, tobacco use disorder, cerebral meningioma, vestibular schwannoma, acute pancreatitis, embolism and thrombosis of arteries of lower extremity, acute respiratory failure with hypoxia, depression, cellulitis of right lower extremity   Additional history obtained:  I was unable to see notes from the [facility] where patient was prescribed doxycycline  antibiotic for right diabetic leg infection/cellulitis  Lab Tests:  I Ordered, and personally interpreted labs.  The pertinent results include: CBC unremarkable, CMP consistent with history of chronic kidney disease, sed rate not elevated, lactic acid 1.2   Imaging Studies ordered:  I ordered imaging studies including x-ray of right tibia/fibula I independently visualized and interpreted imaging which showed no osteomyelitis I agree with the radiologist interpretation   Medicines ordered and prescription drug management:  I ordered medication including ceftriaxone  for cellulitis, diabetic leg infection Reevaluation of the patient after these medicines showed that the patient stayed the same I have reviewed the patients home medicines and have made adjustments as needed   Test Considered:  None   Critical Interventions:  None   Consultations Obtained:  I requested consultation with the hospitalist,  and discussed lab and imaging findings as well as pertinent plan - they recommend: Admission for IV antibiotics and ongoing diagnosis and treatment   Problem List / ED Course:  79 year old male, diabetic right foot infection, on 3 to 4 days of doxycycline  outpatient by [facility] however not improving, has appointment with infectious disease coming up but not for approximately 2 weeks Physical exam well-appearing, vital signs stable, not in acute  distress, nondraining wound present to right anterior shin, with surrounding cellulitis going up the leg, range of motion of right leg normal, patient ambulatory Lab work  overall reassuring, CBC unremarkable, CMP shows evidence of known chronic kidney disease, lactic acid unremarkable, blood cultures drawn, x-ray of right tib-fib shows no osteomyelitis IV ceftriaxone  ordered  Consulted hospitalist (Dr. Silvester) who is okay with admission for further IV antibiotics and ongoing diagnosis and treatment Most likely diagnosis at time of admission is ongoing sequela of diabetic wound on RLE. Infection does not seem systemic at this point, low clinical suspicion for sepsis, vital signs stable.  I do feel that inpatient admission is necessary at this time due to patient risk factors as well as failed outpatient therapy.   Reevaluation:  After the interventions noted above, I reevaluated the patient and found that they have :improved   Social Determinants of Health:  none   Dispostion:  After consideration of the diagnostic results and the patients response to treatment, I feel that the patent would benefit from admission to the hospital for further IV antibiotics and ongoing diagnosis and treatment..       Final diagnoses:  Cellulitis of right lower extremity  Wound infection    ED Discharge Orders     None          [name], PA-C 07/11/24 2328    [name], MD 07/12/24 [phone]

## 2024-07-11 NOTE — Assessment & Plan Note (Signed)
-  chronic avoid nephrotoxic medications such as NSAIDs, Vanco Zosyn combo,  avoid hypotension, continue to follow renal function

## 2024-07-11 NOTE — Assessment & Plan Note (Signed)
-  admit per  cellulitis protocol will       continue current antibiotic choice      plain films showed:   no evidence of air  no evidence of osteomyelitis   no     foreign   objects             Will obtain MRSA screening,          further antibiotic adjustment pending above results  

## 2024-07-11 NOTE — H&P (Signed)
 Sean Saunders FMW:[id number] DOB: October 03, 1946 DOA: 07/11/2024     PCP: [facility]      Patient arrived to ER on 07/11/24 at 1520 Referred by Attending [name], MD   Patient coming from:    home    Chief Complaint:   Chief Complaint  Patient presents with   Wound Infection    HPI: [name] is a 78 y.o. male with medical history significant of COPD, DM2, PVD, Meningioma, schwannoma, gout, HTN HLD tobacco abuse history of stroke history of DVT  Presented with lower leg wound Pt presents with worsening right shin wound. Has been dealing with  a wound for month, Followed by ID in [place]  No wound clinic  Has been on Doxycycline  for the past 3 days no fevers no chills but now has been having worsening redness and swelling around the wound Daughter asked him to go to the emergency department patient is diabetic and has peripheral vascular disease     Denies significant ETOH intake   Does smoke not interested in quitting    Regarding pertinent Chronic problems:     Hyperlipidemia -  on statins Crestor  Lipid Panel     Component Value Date/Time   CHOL  02/09/2011 0500    111        ATP III CLASSIFICATION:  <200     mg/dL   Desirable  799-760  mg/dL   Borderline High  >=759    mg/dL   High          TRIG 805 (H) 01/01/2020 0424   HDL 34 (L) 02/09/2011 0500   CHOLHDL 3.3 02/09/2011 0500   VLDL 27 02/09/2011 0500   LDLCALC  02/09/2011 0500    50        Total Cholesterol/HDL:CHD Risk Coronary Heart Disease Risk Table                     Men   Women  1/2 Average Risk   3.4   3.3  Average Risk       5.0   4.4  2 X Average Risk   9.6   7.1  3 X Average Risk  23.4   11.0        Use the calculated Patient Ratio above and the CHD Risk Table to determine the patient's CHD Risk.        ATP III CLASSIFICATION (LDL):  <100     mg/dL   Optimal  899-870  mg/dL   Near or Above                    Optimal  130-159  mg/dL   Borderline  839-810  mg/dL    High  >809     mg/dL   Very High     HTN on Norvasc  Coreg  benazepril  Peripheral vascular disease on Plavix        DM 2 -  Lab Results  Component Value Date   HGBA1C 7.0 (H) 06/01/2023   on insulin ,    Hypothyroidism:   Lab Results  Component Value Date   TSH 1.069   01/25/2008   on synthroid       COPD -  followed by pulmonology       CKD stage IIIa  baseline Cr 1.1 CrCl cannot be calculated (Unknown ideal weight.).  Lab Results  Component Value Date   CREATININE 1.26 (H) 07/11/2024   CREATININE 1.29 (H) 06/02/2023  CREATININE 1.13 06/01/2023   Lab Results  Component Value Date   NA 137 07/11/2024   CL 106 07/11/2024   K 4.1 07/11/2024   CO2 22 07/11/2024   BUN 24 (H) 07/11/2024   CREATININE 1.26 (H) 07/11/2024   GFRNONAA 58 (L) 07/11/2024   CALCIUM  8.8 (L) 07/11/2024   PHOS 3.7 07/01/2020   ALBUMIN  3.2 (L) 07/11/2024   GLUCOSE 244 (H) 07/11/2024     While in ER: Clinical Course as of 07/11/24 1839  Thu Jul 11, 2024  1801 Labs and vitals reassuring however due to failed outpatient abx therapy will give IV dose here and attempt to admit to prevent deterioration [CH]    Clinical Course User Index [CH] Hinnant, Collin F, PA-C     Tibia No acute fracture or dislocation. No radiographic findings of osteomyelitis, at this time.    Lab Orders         Blood culture (routine x 2)         CBC with Differential         Comprehensive metabolic panel         Sedimentation rate         C-reactive protein         I-Stat CG4 Lactic Acid        Following Medications were ordered in ER: Medications  cefTRIAXone  (ROCEPHIN ) 2 g in sodium chloride  0.9 % 100 mL IVPB (2 g Intravenous New Bag/Given 07/11/24 1729)        ED Triage Vitals  Encounter Vitals Group     BP 07/11/24 1524 (!) 146/88     Girls Systolic BP Percentile --      Girls Diastolic BP Percentile --      Boys Systolic BP Percentile --      Boys Diastolic BP Percentile --      Pulse Rate  07/11/24 1524 76     Resp 07/11/24 1524 15     Temp 07/11/24 1524 98.4 F (36.9 C)     Temp Source 07/11/24 1825 Oral     SpO2 07/11/24 1524 97 %     Weight --      Height --      Head Circumference --      Peak Flow --      Pain Score 07/11/24 1529 4     Pain Loc --      Pain Education --      Exclude from Growth Chart --   UFJK(75)@     _________________________________________ Significant initial  Findings: Abnormal Labs Reviewed  COMPREHENSIVE METABOLIC PANEL WITH GFR - Abnormal; Notable for the following components:      Result Value   Glucose, Bld 244 (*)    BUN 24 (*)    Creatinine, Ser 1.26 (*)    Calcium  8.8 (*)    Total Protein 6.4 (*)    Albumin  3.2 (*)    AST 14 (*)    GFR, Estimated 58 (*)    All other components within normal limits        ECG: Ordered   The recent clinical data is shown below. Vitals:   07/11/24 1524 07/11/24 1825  BP: (!) 146/88 (!) 146/88  Pulse: 76 76  Resp: 15 15  Temp: 98.4 F (36.9 C) 98.4 F (36.9 C)  TempSrc:  Oral  SpO2: 97% 97%      WBC     Component Value Date/Time   WBC 8.0 07/11/2024 1544   LYMPHSABS  2.7 07/11/2024 1544   MONOABS 0.6 07/11/2024 1544   EOSABS 0.4 07/11/2024 1544   BASOSABS 0.1 07/11/2024 1544     Lactic Acid, Venous    Component Value Date/Time   LATICACIDVEN 1.2 07/11/2024 1557        ABX started Antibiotics Given (last 72 hours)     Date/Time Action Medication Dose Rate   07/11/24 1729 New Bag/Given   cefTRIAXone  (ROCEPHIN ) 2 g in sodium chloride  0.9 % 100 mL IVPB 2 g 200 mL/hr   07/11/24 1903 New Bag/Given   vancomycin  (VANCOREADY) IVPB 1500 mg/300 mL 1,500 mg 150 mL/hr   07/11/24 2111 New Bag/Given   Ampicillin -Sulbactam (UNASYN ) 3 g in sodium chloride  0.9 % 100 mL IVPB 3 g 200 mL/hr       __________________________________________________________ Recent Labs  Lab 07/11/24 1544  NA 137  K 4.1  CO2 22  GLUCOSE 244*  BUN 24*  CREATININE 1.26*  CALCIUM  8.8*    Cr   stable,    Lab Results  Component Value Date   CREATININE 1.26 (H) 07/11/2024   CREATININE 1.29 (H) 06/02/2023   CREATININE 1.13 06/01/2023    Recent Labs  Lab 07/11/24 1544  AST 14*  ALT 14  ALKPHOS 98  BILITOT 0.4  PROT 6.4*  ALBUMIN  3.2*   Lab Results  Component Value Date   CALCIUM  8.8 (L) 07/11/2024   PHOS 3.7 07/01/2020    Plt: Lab Results  Component Value Date   PLT 187 07/11/2024    Recent Labs  Lab 07/11/24 1544  WBC 8.0  NEUTROABS 4.1  HGB 13.5  HCT 43.3  MCV 88.7  PLT 187    HG/HCT  stable,       Component Value Date/Time   HGB 13.5 07/11/2024 1544   HCT 43.3 07/11/2024 1544   MCV 88.7 07/11/2024 1544    _______________________________________________ Hospitalist was called for admission for   Cellulitis  Wound infection    The following Work up has been ordered so far:  Orders Placed This Encounter  Procedures   Blood culture (routine x 2)   DG Tibia/Fibula Right   CBC with Differential   Comprehensive metabolic panel   Sedimentation rate   C-reactive protein   Consult to hospitalist   I-Stat CG4 Lactic Acid     OTHER Significant initial  Findings:  labs showing:     DM  labs:  HbA1C: No results for input(s): HGBA1C in the last 8760 hours.     CBG (last 3)  No results for input(s): GLUCAP in the last 72 hours.        Cultures:    Component Value Date/Time   SDES  06/27/2020 0800    BLOOD RIGHT ANTECUBITAL Performed at [facility], [address], [place], [place] [address]    SPECREQUEST  06/27/2020 0800    BOTTLES DRAWN AEROBIC AND ANAEROBIC Blood Culture results may not be optimal due to an inadequate volume of blood received in culture bottles Performed at [facility], [address], [place], [place] [address]    CULT  06/27/2020 0800    NO GROWTH 5 DAYS Performed at [facility], [address] [place], [place] [address]    REPTSTATUS 07/02/2020 FINAL  06/27/2020 0800     Radiological Exams on Admission: DG Tibia/Fibula Right Result Date: 07/11/2024 CLINICAL DATA:  Patient banged his shin, osteomyelitis rule out EXAM: RIGHT TIBIA AND FIBULA - 2 VIEW COMPARISON:  June 01, 2023  FINDINGS: No acute fracture or dislocation. There is no evidence of arthropathy or other focal bone abnormality. Soft tissues are unremarkable. IMPRESSION: No acute fracture or dislocation. No radiographic findings of osteomyelitis, at this time. Electronically Signed   By: Rogelia Myers M.D.   On: 07/11/2024 16:31   _______________________________________________________________________________________________________ Latest  Blood pressure (!) 146/88, pulse 76, temperature 98.4 F (36.9 C), temperature source Oral, resp. rate 15, SpO2 97%.   Vitals  labs and radiology finding personally reviewed  Review of Systems:    Pertinent positives include: right leg ulcer  Constitutional:  No weight loss, night sweats, Fevers, chills, fatigue, weight loss  HEENT:  No headaches, Difficulty swallowing,Tooth/dental problems,Sore throat,  No sneezing, itching, ear ache, nasal congestion, post nasal drip,  Cardio-vascular:  No chest pain, Orthopnea, PND, anasarca, dizziness, palpitations.no Bilateral lower extremity swelling  GI:  No heartburn, indigestion, abdominal pain, nausea, vomiting, diarrhea, change in bowel habits, loss of appetite, melena, blood in stool, hematemesis Resp:  no shortness of breath at rest. No dyspnea on exertion, No excess mucus, no productive cough, No non-productive cough, No coughing up of blood.No change in color of mucus.No wheezing. Skin:  no rash or lesions. No jaundice GU:  no dysuria, change in color of urine, no urgency or frequency. No straining to urinate.  No flank pain.  Musculoskeletal:  No joint pain or no joint swelling. No decreased range of motion. No back pain.  Psych:  No change in mood or affect. No depression or anxiety.  No memory loss.  Neuro: no localizing neurological complaints, no tingling, no weakness, no double vision, no gait abnormality, no slurred speech, no confusion  All systems reviewed and apart from HOPI all are negative _______________________________________________________________________________________________ Past Medical History:   Past Medical History:  Diagnosis Date   Arthritis    COPD (chronic obstructive pulmonary disease) (HCC)    Diabetes mellitus    DVT (deep venous thrombosis) (HCC)    GERD (gastroesophageal reflux disease)    Hyperlipidemia    Hypertension    Hypothyroidism    Onychomycosis 06/07/2013   Stroke (HCC)    TIA's 01/05/11-01/08/11-01/26/11   TIA (transient ischemic attack) 08/03/2011   Left sided weakness. 3 episodes Feb-March     Past Surgical History:  Procedure Laterality Date   ABDOMINAL AORTAGRAM N/A 06/04/2012   Procedure: ABDOMINAL EZELLA;  Surgeon: Lonni GORMAN Blade, MD;  Location: [facility] CATH LAB;  Service: Cardiovascular;  Laterality: N/A;   APPENDECTOMY     COLONOSCOPY     FEMORAL-POPLITEAL BYPASS GRAFT  07/05/2012   Procedure: BYPASS GRAFT FEMORAL-POPLITEAL ARTERY;  Surgeon: Lonni GORMAN Blade, MD;  Location: [facility] OR;  Service: Vascular;  Laterality: Left;  Revision Left fem/pop w/ artery patch angioplasty utilizing left GSV graft and extenion to tib/per trunk w/right GSV and interop arteriogram   JOINT REPLACEMENT  2011   -Hip   NASAL SINUS SURGERY     PR VEIN BYPASS GRAFT,AORTO-FEM-POP     PR VEIN BYPASS GRAFT,AORTO-FEM-POP  08-01-11   (L) Fem-pop BP   toe nail     TONSILLECTOMY      Social History:  Ambulatory   independently      reports that he quit smoking about 6 years ago. His smoking use included cigarettes. He started smoking about 31 years ago. He has a 25 pack-year smoking history. He has never used smokeless tobacco. He reports that he does not currently use alcohol. He reports that he does not use drugs.     Family  History:   Family History  Problem Relation Age of Onset   Stroke Mother    Diabetes Mother    Other Father        alzheimers   Diabetes Father    Stroke Father    Diabetes Sister    ______________________________________________________________________________________________ Allergies: Allergies  Allergen Reactions   Eszopiclone Itching   Niaspan [Niacin Er (Antihyperlipidemic)] Nausea And Vomiting and Rash   Finasteride    Niacin Other (See Comments)   Other Hives and Nausea And Vomiting    Ozempic and      Prior to Admission medications   Medication Sig Start Date End Date Taking? Authorizing Provider  albuterol  (VENTOLIN  HFA) 108 (90 Base) MCG/ACT inhaler Inhale 2 puffs into the lungs every 6 (six) hours as needed for wheezing or shortness of breath.    [provider]  allopurinol  (ZYLOPRIM ) 100 MG tablet Take 100 mg by mouth 2 (two) times daily.    [provider]  amLODipine  (NORVASC ) 10 MG tablet Take 10 mg by mouth daily.    [provider]  azithromycin  (ZITHROMAX  Z-PAK) 250 MG tablet Take 2 tablets day 1 and then 1 daily for 4 days Patient not taking: Reported on 06/27/2024 09/05/23   Neda Jennet LABOR, MD  azithromycin  (ZITHROMAX ) 250 MG tablet Take 1 tablet (250 mg total) by mouth daily. Patient not taking: Reported on 06/27/2024 06/05/23   [place], Camellia PARAS, DO  azithromycin  (ZITHROMAX ) 250 MG tablet TAKE 1 TABLET BY MOUTH EVERY DAY Patient not taking: Reported on 06/27/2024 09/15/23   Byrum, Robert S, MD  azithromycin  (ZITHROMAX ) 250 MG tablet TAKE 1 TABLET BY MOUTH EVERY DAY Patient not taking: Reported on 06/27/2024 10/24/23   Neda Jennet LABOR, MD  benazepril  (LOTENSIN ) 40 MG tablet Take 40 mg by mouth daily. 03/27/23   [provider]  carvedilol  (COREG ) 12.5 MG tablet Take 12.5 mg by mouth 2 (two) times daily with a meal.    [provider]  cetirizine  (ZYRTEC ) 10 MG tablet TAKE 1 TABLET(10 MG) BY MOUTH DAILY 07/11/21    Shelah Lamar RAMAN, MD  clopidogrel  (PLAVIX ) 75 MG tablet Take 75 mg by mouth daily.    [provider]  doxycycline  (VIBRA -TABS) 100 MG tablet Take 1 tablet (100 mg total) by mouth 2 (two) times daily. Patient not taking: Reported on 06/27/2024 10/31/23   Shelah Lamar RAMAN, MD  empagliflozin  (JARDIANCE ) 25 MG TABS tablet Take 25 mg by mouth daily.    [provider]  fluticasone  (FLONASE ) 50 MCG/ACT nasal spray Place 2 sprays into both nostrils daily. 08/07/19   [place], Betty G, MD  furosemide  (LASIX ) 20 MG tablet Take 20 mg by mouth daily.    [provider]  gabapentin  (NEURONTIN ) 300 MG capsule Take 1 capsule (300 mg total) by mouth 3 (three) times daily. 06/05/23 06/27/24  [place], Camellia PARAS, DO  insulin  aspart (NOVOLOG  FLEXPEN) 100 UNIT/ML FlexPen Inject 40 Units into the skin 3 (three) times daily with meals.    [provider]  insulin  glargine (LANTUS ) 100 UNIT/ML injection Inject 65 Units into the skin in the morning.    [provider]  Ipratropium-Albuterol  (COMBIVENT RESPIMAT) 20-100 MCG/ACT AERS respimat Inhale 1 puff into the lungs 2 (two) times daily as needed for wheezing or shortness of breath.    [provider]  ipratropium-albuterol  (DUONEB) 0.5-2.5 (3) MG/3ML SOLN Take 3 mLs by nebulization every 6 (six) hours as needed (sob & wheezing).    [provider]  latanoprost  (XALATAN ) 0.005 % ophthalmic solution Place 1 drop into both eyes at bedtime.    [provider]  levothyroxine  (SYNTHROID ) 125 MCG tablet Take 125 mcg by mouth daily before breakfast.    [provider]  metFORMIN  (GLUCOPHAGE ) 500 MG tablet Take 500 mg by mouth 2 (two) times daily with a meal.    [provider]  montelukast  (SINGULAIR ) 10 MG tablet TAKE 1 TABLET(10 MG) BY MOUTH AT BEDTIME 03/22/24   Byrum, Robert S, MD  Multiple Vitamin (MULTIVITAMIN WITH MINERALS) TABS tablet Take 1 tablet by mouth daily.    [provider]   predniSONE  (DELTASONE ) 10 MG tablet Take 4 tabs by mouth for 3 days, then 3 for 3 days, 2 for 3 days, 1 for 3 days and stop Patient not taking: Reported on 06/27/2024 10/31/23   Shelah Lamar RAMAN, MD  predniSONE  (DELTASONE ) 20 MG tablet Take 1 tablet (20 mg total) by mouth daily with breakfast. Patient not taking: Reported on 06/27/2024 09/05/23   Neda Jennet LABOR, MD  rOPINIRole  (REQUIP ) 0.25 MG tablet Take 0.25 mg by mouth at bedtime.    [provider]  rosuvastatin  (CRESTOR ) 40 MG tablet Take 20 mg by mouth in the morning and at bedtime. 04/06/20   [provider]  tamsulosin  (FLOMAX ) 0.4 MG CAPS capsule Take 0.4 mg by mouth in the morning and at bedtime.    [provider]    ___________________________________________________________________________________________________ Physical Exam:    07/11/2024    6:25 PM 07/11/2024    3:24 PM 06/27/2024    2:13 PM  Vitals with BMI  Weight   214 lbs  Systolic 146 146 863  Diastolic 88 88 60  Pulse 76 76      1. General:  in No  Acute distress   Chronically ill   -appearing 2. Psychological: Alert and   Oriented 3. Head/ENT:    Dry Mucous Membranes                          Head Non traumatic, neck supple                           Poor Dentition 4. SKIN: decreased Skin turgor,  Skin clean Dry           5. Heart: Regular rate and rhythm no  Murmur, no Rub or gallop 6. Lungs:   no wheezes some crackles   7. Abdomen: Soft,  non-tender, Non distended  bowel sounds present 8. Lower extremities: no clubbing, cyanosis, no  edema 9. Neurologically Grossly intact, moving all 4 extremities equally   10. MSK: Normal range of motion    Chart has been reviewed  ______________________________________________________________________________________________  Assessment/Plan 78 y.o. male with medical history significant of COPD, DM2, PVD, Meningioma, schwannoma, gout, HTN HLD tobacco abuse history of stroke history  of DVT Admitted for   Cellulitis of right lower extremity, Wound infection    Present on Admission:  Cellulitis  Cellulitis of right leg  CKD (chronic kidney disease), stage III (HCC)  Hypertension with heart disease  Type 2 diabetes mellitus with diabetic neuropathy, unspecified (HCC)  Atherosclerosis of native artery of extremity with intermittent claudication (HCC)  COPD (chronic obstructive pulmonary disease) (HCC)  Tobacco use disorder  Peripheral vascular disease, unspecified (HCC)    Cellulitis of right leg -admit per  cellulitis protocol will     continue current antibiotic choice  plain films showed:   no evidence of air  no evidence of osteomyelitis   no               foreign   objects           Will obtain MRSA screening,         further antibiotic adjustment pending above results   CKD (chronic kidney disease), stage III (HCC)  -chronic avoid nephrotoxic medications such as NSAIDs, Vanco Zosyn combo,  avoid hypotension, continue to follow renal function   Hypertension with heart disease Continue Norvasc  10 mg a day and Coreg  12.5 mg p.o. twice daily  Type 2 diabetes mellitus with diabetic neuropathy, unspecified (HCC) Order sliding scale Continue Lantus  decreased down to 30 units nightly  Atherosclerosis of native artery of extremity with intermittent claudication (HCC) Continue Plavix   COPD (chronic obstructive pulmonary disease) (HCC) Chronic stable continue home medications  Tobacco use disorder  - Spoke about importance of quitting spent 5 minutes discussing options for treatment, prior attempts at quitting, and dangers of smoking  -At this point patient is   NOT  interested in quitting  - declined nicotine patch   - nursing tobacco cessation protocol   Peripheral vascular disease, unspecified (HCC) On Plavix  and Crestor  20 mg  History of DVT (deep vein thrombosis) Currently not on anticoagulation  History of stroke On Plavix    Other plan  as per orders.  DVT prophylaxis:  SCD   Code Status:  DNR/DNI  as per patient   I had personally discussed CODE STATUS with patient and family  ACP   none   Family Communication:   Family   at  Bedside  plan of care was discussed  with   Son,   Diet  diabetic diet   Disposition Plan:      Back to current facility when stable                            To home once workup is complete and patient is stable   Following barriers for discharge:                                                          Pain controlled with PO medications                              white count improving able to transition to PO antibiotics                                    Consult Orders  (From admission, onward)           Start     Ordered   07/12/24 1906  Nutritional services consult  Once       Provider:  (Not yet assigned)  Question:  Reason for Consult?  Answer:  assess nutrtional status   07/11/24 1907   07/11/24 1842  Consult to Registered Dietitian  Once       Provider:  (Not yet assigned)  Question:  Reason for consult?  Answer:  Wound healing   07/11/24 1842   07/11/24 1842  Consult to wound, ostomy, continence  Once       Provider:  (Not yet assigned)  Question:  Reason for Consult?  Answer:  Lower extremity wound   07/11/24 1842   07/11/24 1724  Consult to hospitalist  Once       Provider:  (Not yet assigned)  Question Answer Comment  Place call to: [facility]   Reason for Consult Admit      07/11/24 1742               Consults called:    NONE   Admission status:  ED Disposition     ED Disposition  Admit   Condition  --   Comment  Hospital Area: [facility] [[id number]]  Level of Care: Med-Surg [16]  May place patient in observation at [facility] or [facility] if equivalent level of care is available:: No  Covid Evaluation: Asymptomatic - no recent exposure (last 10 days) testing not required  Diagnosis: Cellulitis [807680]   Admitting Physician: Lashawna Poche [[id number]]  Attending Physician: Nayef College [[id number]]  For patients discharging to extended facilities (i.e. SNF, AL, group homes or LTAC) initiate:: Discharge to SNF/Facility Placement COVID-19 Lab Testing Protocol          Obs       Level of care      medical floor         Blease Quiver 07/11/2024, 9:47 PM    [facility]     after 2 AM please page floor coverage   If 7AM-7PM, please contact the day team taking care of the patient using [email or web address]

## 2024-07-11 NOTE — ED Provider Triage Note (Signed)
 Emergency Medicine Provider Triage Evaluation Note  Sean Saunders , a 78 y.o. male  was evaluated in triage.  Pt complains of right leg pain. Went to [place] 5-6 days ago and was referred to ID but no appointment until 13th of August. Given Doxycycline , patient currently on day 3 of therapy. Denies fever or chills. Appreciates R leg pain, diabetic with neuropathy as well.   Review of Systems  Positive: R leg wound infected, R leg pain Negative: Fever, chills, chest pain, shortness of breath  Physical Exam  BP (!) 146/88   Pulse 76   Temp 98.4 F (36.9 C)   Resp 15   SpO2 97%  Gen:   Awake, no distress   Resp:  Normal effort  MSK:   Moves extremities without difficulty  Other:  Infected area present to anterior R shin, open, no obvious drainage, erythematous and warm   Medical Decision Making  Medically screening exam initiated at 3:39 PM.  Appropriate orders placed.  Sean Saunders was informed that the remainder of the evaluation will be completed by another provider, this initial triage assessment does not replace that evaluation, and the importance of remaining in the ED until their evaluation is complete.  Orders: CBC, CMP, lactic, blood cultures, xray of R tib/fib, esr, crp   Sean Saunders, [place] 07/11/24 1545

## 2024-07-11 NOTE — Progress Notes (Signed)
 Pharmacy Antibiotic Note  Sean Saunders is a 78 y.o. male admitted on 07/11/2024 with wound infection on R leg after failure to improve on PO abx started 4 days ago.  Pharmacy has been consulted for vancomycin  and Unasyn  dosing.  Plan: Vancomycin  1500 mg IV q24 hr (est AUC 450 based on SCr 1.26 [baseline ~1.15]; Vd 0.72) Measure vancomycin  AUC at steady state as indicated SCr q48 while on vanc MRSA PCR ordered; f/u and narrow vanc as appropriate Unasyn  3g IV q6 hr  Height: 6' (182.9 cm) Weight: 96.6 kg (213 lb) IBW/kg (Calculated) : 77.6  Temp (24hrs), Avg:98.3 F (36.8 C), Min:98 F (36.7 C), Max:98.4 F (36.9 C)  Recent Labs  Lab 07/11/24 1544 07/11/24 1557  WBC 8.0  --   CREATININE 1.26*  --   LATICACIDVEN  --  1.2    Estimated Creatinine Clearance: 57.3 mL/min (A) (by C-G formula based on SCr of 1.26 mg/dL (H)).    Allergies  Allergen Reactions   Eszopiclone Itching   Niaspan [Niacin Er (Antihyperlipidemic)] Nausea And Vomiting and Rash   Finasteride    Niacin Other (See Comments)    Other Reaction(s): Not available, Unknown   Other Hives and Nausea And Vomiting    Ozempic and       Thank you for allowing pharmacy to be a part of this patient's care.  Karder Goodin A 07/11/2024 8:12 PM

## 2024-07-11 NOTE — Assessment & Plan Note (Signed)
 Continue Plavix.  ?

## 2024-07-11 NOTE — Assessment & Plan Note (Signed)
 -  Spoke about importance of quitting spent 5 minutes discussing options for treatment, prior attempts at quitting, and dangers of smoking  -At this point patient is   NOT  interested in quitting  - declined nicotine patch   - nursing tobacco cessation protocol

## 2024-07-11 NOTE — Subjective & Objective (Signed)
 Pt presents with worsening right shin wound. Has been dealing with  a wound for month, Followed by ID in [place]  No wound clinic  Has been on Doxycycline  for the past 3 days no fevers no chills but now has been having worsening redness and swelling around the wound Daughter asked him to go to the emergency department patient is diabetic and has peripheral vascular disease

## 2024-07-11 NOTE — Assessment & Plan Note (Signed)
Currently patient not on anticoagulation.  

## 2024-07-11 NOTE — Assessment & Plan Note (Signed)
 Continue Norvasc  10 mg a day and Coreg  12.5 mg p.o. twice daily

## 2024-07-11 NOTE — Assessment & Plan Note (Signed)
 Chronic stable continue home medications ?

## 2024-07-12 ENCOUNTER — Observation Stay (HOSPITAL_COMMUNITY)

## 2024-07-12 DIAGNOSIS — J449 Chronic obstructive pulmonary disease, unspecified: Secondary | ICD-10-CM | POA: Diagnosis present

## 2024-07-12 DIAGNOSIS — I70221 Atherosclerosis of native arteries of extremities with rest pain, right leg: Secondary | ICD-10-CM | POA: Diagnosis present

## 2024-07-12 DIAGNOSIS — E785 Hyperlipidemia, unspecified: Secondary | ICD-10-CM | POA: Diagnosis present

## 2024-07-12 DIAGNOSIS — L97909 Non-pressure chronic ulcer of unspecified part of unspecified lower leg with unspecified severity: Secondary | ICD-10-CM | POA: Diagnosis not present

## 2024-07-12 DIAGNOSIS — Z79890 Hormone replacement therapy (postmenopausal): Secondary | ICD-10-CM | POA: Diagnosis not present

## 2024-07-12 DIAGNOSIS — I69354 Hemiplegia and hemiparesis following cerebral infarction affecting left non-dominant side: Secondary | ICD-10-CM | POA: Diagnosis not present

## 2024-07-12 DIAGNOSIS — E1140 Type 2 diabetes mellitus with diabetic neuropathy, unspecified: Secondary | ICD-10-CM | POA: Diagnosis present

## 2024-07-12 DIAGNOSIS — E1151 Type 2 diabetes mellitus with diabetic peripheral angiopathy without gangrene: Secondary | ICD-10-CM | POA: Diagnosis present

## 2024-07-12 DIAGNOSIS — Z794 Long term (current) use of insulin: Secondary | ICD-10-CM | POA: Diagnosis not present

## 2024-07-12 DIAGNOSIS — Z7902 Long term (current) use of antithrombotics/antiplatelets: Secondary | ICD-10-CM | POA: Diagnosis not present

## 2024-07-12 DIAGNOSIS — F17200 Nicotine dependence, unspecified, uncomplicated: Secondary | ICD-10-CM | POA: Diagnosis present

## 2024-07-12 DIAGNOSIS — Z823 Family history of stroke: Secondary | ICD-10-CM | POA: Diagnosis not present

## 2024-07-12 DIAGNOSIS — Z79899 Other long term (current) drug therapy: Secondary | ICD-10-CM | POA: Diagnosis not present

## 2024-07-12 DIAGNOSIS — Z86718 Personal history of other venous thrombosis and embolism: Secondary | ICD-10-CM | POA: Diagnosis not present

## 2024-07-12 DIAGNOSIS — E11628 Type 2 diabetes mellitus with other skin complications: Secondary | ICD-10-CM | POA: Diagnosis present

## 2024-07-12 DIAGNOSIS — Z66 Do not resuscitate: Secondary | ICD-10-CM | POA: Diagnosis present

## 2024-07-12 DIAGNOSIS — I129 Hypertensive chronic kidney disease with stage 1 through stage 4 chronic kidney disease, or unspecified chronic kidney disease: Secondary | ICD-10-CM | POA: Diagnosis present

## 2024-07-12 DIAGNOSIS — Z820 Family history of epilepsy and other diseases of the nervous system: Secondary | ICD-10-CM | POA: Diagnosis not present

## 2024-07-12 DIAGNOSIS — E1122 Type 2 diabetes mellitus with diabetic chronic kidney disease: Secondary | ICD-10-CM | POA: Diagnosis present

## 2024-07-12 DIAGNOSIS — Z833 Family history of diabetes mellitus: Secondary | ICD-10-CM | POA: Diagnosis not present

## 2024-07-12 DIAGNOSIS — L03115 Cellulitis of right lower limb: Secondary | ICD-10-CM | POA: Diagnosis present

## 2024-07-12 DIAGNOSIS — Z7984 Long term (current) use of oral hypoglycemic drugs: Secondary | ICD-10-CM | POA: Diagnosis not present

## 2024-07-12 DIAGNOSIS — K219 Gastro-esophageal reflux disease without esophagitis: Secondary | ICD-10-CM | POA: Diagnosis present

## 2024-07-12 DIAGNOSIS — E039 Hypothyroidism, unspecified: Secondary | ICD-10-CM | POA: Diagnosis present

## 2024-07-12 DIAGNOSIS — N1831 Chronic kidney disease, stage 3a: Secondary | ICD-10-CM | POA: Diagnosis present

## 2024-07-12 LAB — MRSA NEXT GEN BY PCR, NASAL: MRSA by PCR Next Gen: NOT DETECTED

## 2024-07-12 LAB — COMPREHENSIVE METABOLIC PANEL WITH GFR
ALT: 13 U/L (ref 0–44)
AST: 13 U/L — ABNORMAL LOW (ref 15–41)
Albumin: 2.9 g/dL — ABNORMAL LOW (ref 3.5–5.0)
Alkaline Phosphatase: 93 U/L (ref 38–126)
Anion gap: 8 (ref 5–15)
BUN: 23 mg/dL (ref 8–23)
CO2: 26 mmol/L (ref 22–32)
Calcium: 9 mg/dL (ref 8.9–10.3)
Chloride: 110 mmol/L (ref 98–111)
Creatinine, Ser: 1.27 mg/dL — ABNORMAL HIGH (ref 0.61–1.24)
GFR, Estimated: 57 mL/min — ABNORMAL LOW (ref >60)
Glucose, Bld: 167 mg/dL — ABNORMAL HIGH (ref 70–99)
Potassium: 4.1 mmol/L (ref 3.5–5.1)
Sodium: 144 mmol/L (ref 135–145)
Total Bilirubin: 0.6 mg/dL (ref 0.0–1.2)
Total Protein: 6.1 g/dL — ABNORMAL LOW (ref 6.5–8.1)

## 2024-07-12 LAB — GLUCOSE, CAPILLARY
Glucose-Capillary: 119 mg/dL — ABNORMAL HIGH (ref 70–99)
Glucose-Capillary: 178 mg/dL — ABNORMAL HIGH (ref 70–99)
Glucose-Capillary: 203 mg/dL — ABNORMAL HIGH (ref 70–99)
Glucose-Capillary: 215 mg/dL — ABNORMAL HIGH (ref 70–99)

## 2024-07-12 LAB — VAS US ABI WITH/WO TBI
Left ABI: 0.99
Right ABI: 1

## 2024-07-12 LAB — CBC
HCT: 41.6 % (ref 39.0–52.0)
Hemoglobin: 13 g/dL (ref 13.0–17.0)
MCH: 28.1 pg (ref 26.0–34.0)
MCHC: 31.3 g/dL (ref 30.0–36.0)
MCV: 89.8 fL (ref 80.0–100.0)
Platelets: 165 K/uL (ref 150–400)
RBC: 4.63 MIL/uL (ref 4.22–5.81)
RDW: 15 % (ref 11.5–15.5)
WBC: 6.9 K/uL (ref 4.0–10.5)
nRBC: 0 % (ref 0.0–0.2)

## 2024-07-12 LAB — MAGNESIUM: Magnesium: 2.2 mg/dL (ref 1.7–2.4)

## 2024-07-12 LAB — TROPONIN I (HIGH SENSITIVITY)
Troponin I (High Sensitivity): 9 ng/L (ref <18)
Troponin I (High Sensitivity): 9 ng/L (ref <18)

## 2024-07-12 LAB — PREALBUMIN: Prealbumin: 17 mg/dL — ABNORMAL LOW (ref 18–38)

## 2024-07-12 LAB — PHOSPHORUS: Phosphorus: 4.8 mg/dL — ABNORMAL HIGH (ref 2.5–4.6)

## 2024-07-12 MED ORDER — COLLAGENASE 250 UNIT/GM EX OINT
TOPICAL_OINTMENT | Freq: Every day | CUTANEOUS | Status: DC
  Administered 2024-07-12: 1 via TOPICAL
  Filled 2024-07-12: qty 30

## 2024-07-12 MED ORDER — FAMOTIDINE IN NACL 20-0.9 MG/50ML-% IV SOLN
20.0000 mg | Freq: Once | INTRAVENOUS | Status: AC
  Administered 2024-07-12: 20 mg via INTRAVENOUS
  Filled 2024-07-12: qty 50

## 2024-07-12 MED ORDER — ENSURE PLUS HIGH PROTEIN PO LIQD: 237.0000 mL | Freq: Two times a day (BID) | ORAL | Status: DC

## 2024-07-12 MED ORDER — SIMETHICONE 80 MG PO CHEW
160.0000 mg | CHEWABLE_TABLET | Freq: Once | ORAL | Status: AC
  Administered 2024-07-12: 160 mg via ORAL
  Filled 2024-07-12: qty 2

## 2024-07-12 NOTE — TOC Initial Note (Signed)
 Transition of Care Sean Saunders) - Initial/Assessment Note    Patient Details  Name: Sean Saunders MRN: [id number] Date of Birth: 11-04-1946  Transition of Care Sean Saunders) CM/SW Contact:    Sean JONELLE Rex, RN Phone Number: 07/12/2024, 9:31 AM  Clinical Narrative:   Met with patient at bedside to introduce role of TOC/Ncm and review for dc planning, pt confirmed he has an established PCP, no current home care services, states he has lots of DME, RW, currently not using, pt reports he lives alone, his children do not live local, has support from family and neighbors. TOC consult for HH/DME needs.  PT eval, await recommendation. TOC will continue to follow.                 Expected Discharge Plan: Home w Home Health Services Barriers to Discharge: Continued Medical Work up   Patient Goals and CMS Choice Patient states their goals for this hospitalization and ongoing recovery are:: return home          Expected Discharge Plan and Services       Living arrangements for the past 2 months: Single Family Home                                      Prior Living Arrangements/Services Living arrangements for the past 2 months: Single Family Home Lives with:: Self Patient language and need for interpreter reviewed:: Yes Do you feel safe going back to the place where you live?: Yes      Need for Family Participation in Patient Care: Yes (Comment) Care giver support system in place?: Yes (comment) Current home services: DME (RW, other DME per patient, not currently using) Criminal Activity/Legal Involvement Pertinent to Current Situation/Hospitalization: No - Comment as needed  Activities of Daily Living   ADL Screening (condition at time of admission) Independently performs ADLs?: Yes (appropriate for developmental age) Is the patient deaf or have difficulty hearing?: No Does the patient have difficulty seeing, even when wearing glasses/contacts?: No Does the patient have difficulty  concentrating, remembering, or making decisions?: No  Permission Sought/Granted                  Emotional Assessment Appearance:: Appears stated age Attitude/Demeanor/Rapport: Engaged Affect (typically observed): Accepting Orientation: : Oriented to Self, Oriented to Place, Oriented to  Time, Oriented to Situation Alcohol / Substance Use: Not Applicable Psych Involvement: No (comment)  Admission diagnosis:  Cellulitis [L03.90] Wound infection [T14.8XXA, L08.9] Cellulitis of right lower extremity [L03.115] Patient Active Problem List   Diagnosis Date Noted   Cellulitis 07/11/2024   History of DVT (deep vein thrombosis) 07/11/2024   History of stroke 07/11/2024   Cellulitis of right lower extremity 06/02/2023   Cellulitis of right leg 06/01/2023   COPD (chronic obstructive pulmonary disease) (HCC) 05/10/2023   Acute sinusitis 07/21/2022   Cerumen impaction 07/21/2022   Back pain 03/28/2022   Stridor 08/31/2020   Chronic respiratory failure with hypoxia (HCC) 08/31/2020   Depression 07/16/2020   Acute respiratory failure with hypoxia (HCC) 06/27/2020   Fever    Embolism and thrombosis of arteries of lower extremity (HCC) 01/10/2020   Cholelithiasis 12/31/2019   Acute pancreatitis 12/31/2019   Chronic sinusitis 09/03/2019   CKD (chronic kidney disease), stage III (HCC) 09/17/2018   Meningioma, cerebral (HCC) 09/17/2018   Vestibular schwannoma (HCC) 09/17/2018   Rhinitis, allergic 09/25/2017   Tobacco use disorder 09/11/2017  COPD with acute lower respiratory infection (HCC) 09/10/2017   Hypertension with heart disease 09/04/2017   Pain in lower limb 03/21/2014   Type 2 diabetes mellitus with diabetic neuropathy, unspecified (HCC) 03/21/2014   PVD (peripheral vascular disease) (HCC) 12/11/2013   Occlusion and stenosis of carotid artery without mention of cerebral infarction 12/11/2013   Onychomycosis 06/07/2013   Pain in joint, ankle and foot 06/07/2013   Callus of  foot 06/07/2013   Peripheral vascular disease, unspecified (HCC) 05/29/2013   Aftercare following surgery of the circulatory system, NEC 05/29/2013   Other postoperative infection 11/13/2012   Neuropathic pain of lower extremity 11/23/2011   Atherosclerosis of native artery of extremity with intermittent claudication (HCC) 10/12/2011   PCP:  [facility]:   [id number] [place], [place] - [address] AT [facility] [address] [place] [address] Phone: [phone] Fax: [phone]     Social Drivers of Health (SDOH) Social History: SDOH Screenings   Food Insecurity: No Food Insecurity (07/11/2024)  Housing: Low Risk  (07/11/2024)  Transportation Needs: No Transportation Needs (07/11/2024)  Utilities: Not At Risk (07/11/2024)  Depression (PHQ2-9): High Risk (04/18/2022)  Financial Resource Strain: Low Risk  (08/25/2020)  Physical Activity: Inactive (08/25/2020)  Social Connections: Moderately Isolated (07/11/2024)  Stress: No Stress Concern Present (08/25/2020)  Tobacco Use: Medium Risk (07/11/2024)   SDOH Interventions:     Readmission Risk Interventions     No data to display

## 2024-07-12 NOTE — Progress Notes (Addendum)
 Called to pt's room for pain meds, pt c/o's of sudden chest pain, states is a pressure type pain in the center of chest, no radiation. VS taken, O2 applied at 2 L, pt warm/dry, previously vomited approx 300 cc's bile material. Noted flushed appearance but this was noted from am assessment as well. MD notified, STAT EKG done, awaiting stat labs.

## 2024-07-12 NOTE — Progress Notes (Signed)
 Initial Nutrition Assessment  DOCUMENTATION CODES:   Not applicable  INTERVENTION:  -Continue heart healthy/carb modified menu, regular texture, thin liquids diet -Add Ensure Plus High Protein BID to promote adequate kcal/pro  intake with increased needs -Add MVI -Education given, would benefit from ongoing education regarding diabetes   NUTRITION DIAGNOSIS:   Food and nutrition related knowledge deficit related to limited prior education as evidenced by per patient/family report.   GOAL:   Patient will meet greater than or equal to 90% of their needs   MONITOR:   PO intake, Weight trends, Supplement acceptance, Skin, Labs  REASON FOR ASSESSMENT:   Consult Assessment of nutrition requirement/status, Wound healing  ASSESSMENT:   Hx COPD, DM2, PVD,  Meningioma, schwannoma, gout, HTN, HLD, tobacco abuse, stroke, DVT. Presented with worsening lower leg wound  Spoke to pt at bedside. Pt denies n/v/c/d or chewing/swallowing difficulties. Last BM 7/31. Pt denies poor appetite here or at home. Documented PO intake 100%. Add EPHP BID r/t increased needs. Pt denies recent weight changes. NFPE completed (see below). Long discussion regarding diabetes MNT. Pt would benefit from ongoing education while admitted as well as outpatient. Pt admitted r/t lower leg wound, being charted as an abrasion, wound care signed off. Pt denies additional questions/concerns at this time, will continue to monitor, RDN available prn.   Labs BG 119-352 Creat 1.27 Phos 4.8 Albumin  2.9 AST 13 Prealbumin 17 GFR 57 CRP 1.1  Medication  amLODipine   10 mg Oral Daily   carvedilol   12.5 mg Oral BID WC   clopidogrel   75 mg Oral Daily   collagenase   Topical Daily   insulin  aspart  0-15 Units Subcutaneous TID WC   insulin  aspart  0-5 Units Subcutaneous QHS   insulin  glargine-yfgn  30 Units Subcutaneous QHS   latanoprost   1 drop Both Eyes QHS   levothyroxine   125 mcg Oral QAC breakfast   montelukast    10 mg Oral QHS   multivitamin with minerals  1 tablet Oral Daily   rosuvastatin   20 mg Oral Daily   tamsulosin   0.4 mg Oral QPC supper     NUTRITION - FOCUSED PHYSICAL EXAM:  Flowsheet Row Most Recent Value  Orbital Region No depletion  Upper Arm Region No depletion  Thoracic and Lumbar Region No depletion  Buccal Region No depletion  Temple Region Mild depletion  Clavicle Bone Region Mild depletion  Clavicle and Acromion Bone Region Mild depletion  Scapular Bone Region No depletion  Dorsal Hand No depletion  Patellar Region Mild depletion  Anterior Thigh Region Mild depletion  Posterior Calf Region Mild depletion  Edema (RD Assessment) None  Hair Reviewed  Eyes Reviewed  Mouth Reviewed  Skin Reviewed  Nails Reviewed    Diet Order:   Diet Order             Diet heart healthy/carb modified Room service appropriate? Yes; Fluid consistency: Thin  Diet effective now                   EDUCATION NEEDS:   Education needs have been addressed  Skin:  Skin Assessment: Skin Integrity Issues: Skin Integrity Issues:: Other (Comment) Other: Lower leg wound. Being charted as abrasion  Last BM:  7/31  Height:   Ht Readings from Last 1 Encounters:  07/11/24 6' (1.829 m)    Weight:   Wt Readings from Last 1 Encounters:  07/11/24 96.6 kg    BMI:  Body mass index is 28.89 kg/m.  Estimated Nutritional  Needs:   Kcal:  1950-2400 kcal  Protein:  100-120 g  Fluid:  >/=2L   Sean Saunders, RDN, LDN Registered Dietitian Nutritionist RD Inpatient Contact Info in Register

## 2024-07-12 NOTE — Progress Notes (Signed)
 PROGRESS NOTE    Sean Saunders  FMW:[id number] DOB: 1946-04-14 DOA: 07/11/2024 PCP: [facility]   Brief Narrative:  Sean Saunders is a 78 y.o. male with medical history significant of COPD, DM2, PVD, Meningioma, schwannoma, gout, HTN HLD tobacco abuse history of stroke history of DVT Presented with worsening lower leg wound, which he has been dealing with for months and has been seeing ID at [facility], has never seen wound care.  Patient has been on doxycycline  for past 3 days.  No fevers or chills.  Assessment & Plan:   Principal Problem:   Cellulitis Active Problems:   Cellulitis of right leg   Type 2 diabetes mellitus with diabetic neuropathy, unspecified (HCC)   Hypertension with heart disease   CKD (chronic kidney disease), stage III (HCC)   Atherosclerosis of native artery of extremity with intermittent claudication (HCC)   Peripheral vascular disease, unspecified (HCC)   Tobacco use disorder   COPD (chronic obstructive pulmonary disease) (HCC)   History of DVT (deep vein thrombosis)   History of stroke  Diabetic right lower extremity infected wound/cellulitis, POA: Appears to be very mild cellulitis.  No purulence.  Doubt MRSA.  Discontinue vancomycin .  Continue Rocephin .  Consult wound care.  Essential hypertension: Slightly elevated.  Continue home medications which is Coreg  and amlodipine .  Type 2 diabetes mellitus with diabetic neuropathy: Continue Lantus  30 units and SSI.  Blood sugar labile.  CKD stage IIIa: At baseline.  Atherosclerosis of native artery of extremity with intermittent claudication/PAD: Continue Plavix  and Crestor .  History of stroke: Continue Plavix .  COPD: Stable.  Continue home medications.  Tobacco use disorder: Nicotine counseling.  DVT prophylaxis: SCDs Start: 07/11/24 1951   Code Status: Limited: Do not attempt resuscitation (DNR) -DNR-LIMITED -Do Not Intubate/DNI   Family Communication:  None present at bedside.  Plan of care  discussed with patient in length and he/she verbalized understanding and agreed with it.  Status is: Observation The patient will require care spanning > 2 midnights and should be moved to inpatient because: Patient will require another night with IV antibiotics.   Estimated body mass index is 28.89 kg/m as calculated from the following:   Height as of this encounter: 6' (1.829 m).   Weight as of this encounter: 96.6 kg.    Nutritional Assessment: Body mass index is 28.89 kg/m.SABRA Seen by dietician.  I agree with the assessment and plan as outlined below: Nutrition Status:        . Skin Assessment: I have examined the patient's skin and I agree with the wound assessment as performed by the wound care RN as outlined below:    Consultants:  None  Procedures:  None  Antimicrobials:  Anti-infectives (From admission, onward)    Start     Dose/Rate Route Frequency Ordered Stop   07/12/24 0600  vancomycin  (VANCOREADY) IVPB 1500 mg/300 mL        1,500 mg 150 mL/hr over 120 Minutes Intravenous Daily 07/11/24 2010     07/11/24 2000  Ampicillin -Sulbactam (UNASYN ) 3 g in sodium chloride  0.9 % 100 mL IVPB        3 g 200 mL/hr over 30 Minutes Intravenous Every 6 hours 07/11/24 1852     07/11/24 1900  vancomycin  (VANCOREADY) IVPB 1500 mg/300 mL        1,500 mg 150 mL/hr over 120 Minutes Intravenous  Once 07/11/24 1852 07/11/24 2103   07/11/24 1730  cefTRIAXone  (ROCEPHIN ) 2 g in sodium chloride  0.9 % 100  mL IVPB        2 g 200 mL/hr over 30 Minutes Intravenous  Once 07/11/24 1721 07/11/24 1759         Subjective: Patient seen and examined, he says that he is feeling a lot better.  He has no complaint at all.  Very minimal pain at the right lower extremity.  Objective: Vitals:   07/11/24 1956 07/11/24 2216 07/12/24 0229 07/12/24 0558  BP: (!) 184/66 (!) 161/64 (!) 147/91 (!) 182/89  Pulse: 78 60 65 65  Resp: 16 16 18 16   Temp: 98 F (36.7 C) 98.4 F (36.9 C) 97.9 F (36.6  C) (!) 97.4 F (36.3 C)  TempSrc:      SpO2: 97% 95% 94% 95%  Weight:      Height:        Intake/Output Summary (Last 24 hours) at 07/12/2024 0810 Last data filed at 07/12/2024 0600 Gross per 24 hour  Intake 1025.08 ml  Output --  Net 1025.08 ml   Filed Weights   07/11/24 1939  Weight: 96.6 kg    Examination:  General exam: Appears calm and comfortable  Respiratory system: Clear to auscultation. Respiratory effort normal. Cardiovascular system: S1 & S2 heard, RRR. No JVD, murmurs, rubs, gallops or clicks. No pedal edema. Gastrointestinal system: Abdomen is nondistended, soft and nontender. No organomegaly or masses felt. Normal bowel sounds heard. Central nervous system: Alert and oriented. No focal neurological deficits. Extremities: Right lower extremity open but healing wound with some erythema around it, slightly warm and tender to touch.  Overall improved compared to yesterday. Psychiatry: Judgement and insight appear normal. Mood & affect appropriate.    Data Reviewed: I have personally reviewed following labs and imaging studies  CBC: Recent Labs  Lab 07/11/24 1544 07/12/24 0339  WBC 8.0 6.9  NEUTROABS 4.1  --   HGB 13.5 13.0  HCT 43.3 41.6  MCV 88.7 89.8  PLT 187 165   Basic Metabolic Panel: Recent Labs  Lab 07/11/24 1544 07/12/24 0339  NA 137 144  K 4.1 4.1  CL 106 110  CO2 22 26  GLUCOSE 244* 167*  BUN 24* 23  CREATININE 1.26* 1.27*  CALCIUM  8.8* 9.0  MG  --  2.2  PHOS  --  4.8*   GFR: Estimated Creatinine Clearance: 56.8 mL/min (A) (by C-G formula based on SCr of 1.27 mg/dL (H)). Liver Function Tests: Recent Labs  Lab 07/11/24 1544 07/12/24 0339  AST 14* 13*  ALT 14 13  ALKPHOS 98 93  BILITOT 0.4 0.6  PROT 6.4* 6.1*  ALBUMIN  3.2* 2.9*   No results for input(s): LIPASE, AMYLASE in the last 168 hours. No results for input(s): AMMONIA in the last 168 hours. Coagulation Profile: No results for input(s): INR, PROTIME in the  last 168 hours. Cardiac Enzymes: No results for input(s): CKTOTAL, CKMB, CKMBINDEX, TROPONINI in the last 168 hours. BNP (last 3 results) No results for input(s): PROBNP in the last 8760 hours. HbA1C: No results for input(s): HGBA1C in the last 72 hours. CBG: Recent Labs  Lab 07/11/24 2215 07/12/24 0737  GLUCAP 352* 119*   Lipid Profile: No results for input(s): CHOL, HDL, LDLCALC, TRIG, CHOLHDL, LDLDIRECT in the last 72 hours. Thyroid  Function Tests: No results for input(s): TSH, T4TOTAL, FREET4, T3FREE, THYROIDAB in the last 72 hours. Anemia Panel: No results for input(s): VITAMINB12, FOLATE, FERRITIN, TIBC, IRON, RETICCTPCT in the last 72 hours. Sepsis Labs: Recent Labs  Lab 07/11/24 1557  LATICACIDVEN 1.2  Recent Results (from the past 240 hours)  Blood culture (routine x 2)     Status: None (Preliminary result)   Collection Time: 07/11/24  9:50 PM   Specimen: BLOOD RIGHT ARM  Result Value Ref Range Status   Specimen Description   Final    BLOOD RIGHT ARM Performed at [facility], [address], [place], [place] [address]    Special Requests   Final    BOTTLES DRAWN AEROBIC AND ANAEROBIC Blood Culture results may not be optimal due to an inadequate volume of blood received in culture bottles Performed at [facility], [address] [place], [place] [address]    Culture PENDING  Incomplete   Report Status PENDING  Incomplete  Blood culture (routine x 2)     Status: None (Preliminary result)   Collection Time: 07/11/24  9:50 PM   Specimen: BLOOD RIGHT HAND  Result Value Ref Range Status   Specimen Description   Final    BLOOD RIGHT HAND Performed at [facility], [address] [place], [place] [address]    Special Requests   Final    BOTTLES DRAWN AEROBIC AND ANAEROBIC Blood Culture results may not be optimal due to an inadequate volume of blood received in culture  bottles Performed at [facility], [address], [place], [place] [address]    Culture PENDING  Incomplete   Report Status PENDING  Incomplete  MRSA Next Gen by PCR, Nasal     Status: None   Collection Time: 07/11/24 10:09 PM   Specimen: Nasal Mucosa; Nasal Swab  Result Value Ref Range Status   MRSA by PCR Next Gen NOT DETECTED NOT DETECTED Final    Comment: (NOTE) The GeneXpert MRSA Assay (FDA approved for NASAL specimens only), is one component of a comprehensive MRSA colonization surveillance program. It is not intended to diagnose MRSA infection nor to guide or monitor treatment for MRSA infections. Test performance is not FDA approved in patients less than 42 years old. Performed at [facility], [address] [place], [place] [address]      Radiology Studies: DG Tibia/Fibula Right Result Date: 07/11/2024 CLINICAL DATA:  Patient banged his shin, osteomyelitis rule out EXAM: RIGHT TIBIA AND FIBULA - 2 VIEW COMPARISON:  June 01, 2023 FINDINGS: No acute fracture or dislocation. There is no evidence of arthropathy or other focal bone abnormality. Soft tissues are unremarkable. IMPRESSION: No acute fracture or dislocation. No radiographic findings of osteomyelitis, at this time. Electronically Signed   By: Rogelia Myers M.D.   On: 07/11/2024 16:31    Scheduled Meds:  amLODipine   10 mg Oral Daily   carvedilol   12.5 mg Oral BID WC   clopidogrel   75 mg Oral Daily   insulin  aspart  0-15 Units Subcutaneous TID WC   insulin  aspart  0-5 Units Subcutaneous QHS   insulin  glargine-yfgn  30 Units Subcutaneous QHS   latanoprost   1 drop Both Eyes QHS   levothyroxine   125 mcg Oral QAC breakfast   montelukast   10 mg Oral QHS   multivitamin with minerals  1 tablet Oral Daily   rosuvastatin   20 mg Oral Daily   tamsulosin   0.4 mg Oral QPC supper   Continuous Infusions:  ampicillin -sulbactam (UNASYN ) IV 3 g (07/12/24 0256)   vancomycin  1,500 mg  (07/12/24 0557)     LOS: 0 days   Fredia Skeeter, MD [facility]  07/12/2024, 8:10 AM   *Please note  that this is a verbal dictation therefore any spelling or grammatical errors are due to the Dragon Medical One system interpretation.  Please page via Amion and do not message via secure chat for urgent patient care matters. Secure chat can be used for non urgent patient care matters.  How to contact the [facility] Attending or Consulting provider 7A - 7P or covering provider during after hours 7P -7A, for this patient?  Check the care team in [facility] and look for a) attending/consulting [facility] provider listed and b) the [facility] team listed. Page or secure chat 7A-7P. Log into [email or web address] and use [place]'s universal password to access. If you do not have the password, please contact the hospital operator. Locate the [facility] provider you are looking for under [facility] and page to a number that you can be directly reached. If you still have difficulty reaching the provider, please page the [facility] (Director on Call) for the Hospitalists listed on amion for assistance.

## 2024-07-12 NOTE — Progress Notes (Signed)
 VASCULAR LAB    ABI has been performed.  See CV proc for preliminary results.   Roque Schill, RVT 07/12/2024, 11:04 AM

## 2024-07-12 NOTE — Consult Note (Addendum)
 WOC Nurse Consult Note: Reason for Consult: requested to assess wound on R leg. Wound type: Full thickness, anterior region of leg, tibial preeminence. Pressure Injury POA: NA Measurement: aprox. 3 cm x 2 cm, irregular. Wound bed: 90% dry scab, 10% red. Drainage (amount, consistency, odor) Scant. Periwound: intact, redness with aprox. 10 cm at 6 o'clock position. Note: No edema present. Regarding his tobacco history, investigate a possible arterial insufficiency.  Dressing procedure/placement/frequency: Cleanse with Vashe [id number], pat dry the periwound skin, not rinse. Apply santyl on the wound bed daily, cover with foam dressing, change every 3 days or PRN. Mark the reddened area with permanent marker to follow a possible spread infection.  WOC team will not plan to follow further. Please reconsult if further assistance is needed. Thank-you,  Lela Holm BSN, CNS, RN, [employer], WOCN  (Phone [phone])

## 2024-07-12 NOTE — Progress Notes (Addendum)
     Patient Name: Sean Saunders           DOB: 10-27-1946  MRN: [id number]      Admission Date: 07/11/2024  Attending Provider: Vernon Ranks, MD  Primary Diagnosis: Cellulitis   Level of care: Med-Surg   OVERNIGHT PROGRESS REPORT  Patient c/o 3/10 epigastric pressure-like discomfort, which she attributes to trapped gas he has been unable to expel.   Overall he appears comfortable in bed, with no visible signs of distress. Pain is reproducible upon palpation of the epigastric region.  No chest wall tenderness. Denies lightheadedness, SOB, palpitations, or other radiating pains. Earlier today, he experienced nausea and vomiting with intermittent heartburn.   Plan: Discomfort likely related to gas entrapment and mild reflux-trial simethicone , Pepcid. EKG - NSR without ischemic changes Troponin pending   Addendum-  2115- pt reports relief after interventions.  Troponin negative  Lavanda Horns, DNP, ACNPC- AG [facility] [place]

## 2024-07-12 NOTE — Plan of Care (Signed)
   Problem: Fluid Volume: Goal: Ability to maintain a balanced intake and output will improve Outcome: Progressing

## 2024-07-13 ENCOUNTER — Encounter (HOSPITAL_COMMUNITY): Payer: Self-pay | Admitting: Family Medicine

## 2024-07-13 DIAGNOSIS — L03115 Cellulitis of right lower limb: Secondary | ICD-10-CM | POA: Diagnosis not present

## 2024-07-13 LAB — BASIC METABOLIC PANEL WITH GFR
Anion gap: 7 (ref 5–15)
BUN: 20 mg/dL (ref 8–23)
CO2: 27 mmol/L (ref 22–32)
Calcium: 9.1 mg/dL (ref 8.9–10.3)
Chloride: 109 mmol/L (ref 98–111)
Creatinine, Ser: 1.15 mg/dL (ref 0.61–1.24)
GFR, Estimated: >60 mL/min (ref >60)
Glucose, Bld: 139 mg/dL — ABNORMAL HIGH (ref 70–99)
Potassium: 4.8 mmol/L (ref 3.5–5.1)
Sodium: 143 mmol/L (ref 135–145)

## 2024-07-13 LAB — HEMOGLOBIN A1C
Hgb A1c MFr Bld: 7.8 % — ABNORMAL HIGH (ref 4.8–5.6)
Mean Plasma Glucose: 177 mg/dL

## 2024-07-13 LAB — GLUCOSE, CAPILLARY: Glucose-Capillary: 140 mg/dL — ABNORMAL HIGH (ref 70–99)

## 2024-07-13 MED ORDER — SULFAMETHOXAZOLE-TRIMETHOPRIM 800-160 MG PO TABS: 1.0000 | ORAL_TABLET | Freq: Two times a day (BID) | ORAL | 0 refills | Status: AC

## 2024-07-13 NOTE — Plan of Care (Signed)
   Problem: Education: Goal: Ability to describe self-care measures that may prevent or decrease complications (Diabetes Survival Skills Education) will improve Outcome: Progressing Goal: Individualized Educational Video(s) Outcome: Progressing   Problem: Coping: Goal: Ability to adjust to condition or change in health will improve Outcome: Progressing

## 2024-07-13 NOTE — Discharge Summary (Signed)
 Physician Discharge Summary  Sean Saunders FMW:[id number] DOB: [birth year] DOA: 07/11/2024  PCP: [facility]  Admit date: 07/11/2024 Discharge date: 07/13/2024 30 Day Unplanned Readmission Risk Score    Flowsheet Row ED to Hosp-Admission (Current) from 07/11/2024 in [facility]  30 Day Unplanned Readmission Risk Score (%) 13.59 Filed at 07/13/2024 0800    This score is the patient's risk of an unplanned readmission within 30 days of being discharged (0 -100%). The score is based on dignosis, age, lab data, medications, orders, and past utilization.   Low:  0-14.9   Medium: 15-21.9   High: 22-29.9   Extreme: 30 and above          Admitted From: Home Disposition: Home  Recommendations for Outpatient Follow-up:  Follow up with PCP in 1-2 weeks Please obtain BMP/CBC in one week Please follow up with your PCP on the following pending results: Unresulted Labs (From admission, onward)    None         Home Health: None Equipment/Devices: None  Discharge Condition: Stable CODE STATUS: DNR Diet recommendation: Diabetic  Subjective: Seen and examined.  He has no complaints.  He is in agreement with going home.  Brief/Interim Summary: Sean Saunders is a 78 y.o. male with medical history significant of COPD, DM2, PVD,Meningioma, schwannoma, gout, HTN HLD tobacco abuse history of stroke history of DVT Presented with worsening lower leg wound, which he has been dealing with for months and has been seeing ID at [facility], has never seen wound care.  Patient has been on doxycycline  for past 3 days.  No fevers or chills.  Admitted to hospital service.  Details below.   Diabetic right lower extremity infected wound/cellulitis, POA: Appears to be very mild cellulitis.  No purulence.  Doubt MRSA.  Discontinued vancomycin .  Continued Rocephin .  Seen by wound care.  Does not appear to have any open wound to require much of her wound care.  Patient's cellulitis has improved  significantly.  Upon talking to patient, he says that he took doxycycline  for 3 days and he believes that he was improving but his daughter has different opinion and thought that he was not getting better so she sent him to the ED.  Regardless, cellulitis has improved significantly.  Since it is not clear whether doxycycline  was working for him or not, I am discharging this patient on Bactrim  DS for 7 days.  His renal function is normal.   Essential hypertension: Continue home medications which is Coreg  and amlodipine .   Type 2 diabetes mellitus with diabetic neuropathy: Continue Lantus  PTA dose.    CKD stage IIIa: Better than baseline   Atherosclerosis of native artery of extremity with intermittent claudication/PAD: Continue Plavix  and Crestor .   History of stroke: Continue Plavix .   COPD: Stable.  Continue home medications.   Tobacco use disorder: I have discussed tobacco cessation with the patient.  I have counseled the patient regarding the negative impacts of continued tobacco use including but not limited to lung cancer, COPD, and cardiovascular disease.  I have discussed alternatives to tobacco and modalities that may help facilitate tobacco cessation including but not limited to biofeedback, hypnosis, and medications.  Total time spent with tobacco counseling was 5 minutes.  Discharge Diagnoses:  Principal Problem:   Cellulitis Active Problems:   Cellulitis of right leg   Type 2 diabetes mellitus with diabetic neuropathy, unspecified (HCC)   Hypertension with heart disease   CKD (chronic kidney disease), stage III (HCC)  Atherosclerosis of native artery of extremity with intermittent claudication (HCC)   Peripheral vascular disease, unspecified (HCC)   Tobacco use disorder   COPD (chronic obstructive pulmonary disease) (HCC)   Cellulitis of right lower extremity   History of DVT (deep vein thrombosis)   History of stroke    Discharge Instructions   Allergies as of  07/13/2024       Reactions   Eszopiclone Hives, Itching   Niacin Hives   Niaspan [niacin Er (antihyperlipidemic)] Nausea And Vomiting, Rash   Ozempic (0.25 Or 0.5 Mg-dose) [semaglutide(0.25 Or 0.5mg -dos)] Hives, Nausea And Vomiting        Medication List     STOP taking these medications    azithromycin  250 MG tablet Commonly known as: ZITHROMAX    doxycycline  100 MG tablet Commonly known as: VIBRA -TABS   gabapentin  300 MG capsule Commonly known as: NEURONTIN    predniSONE  10 MG tablet Commonly known as: DELTASONE    predniSONE  20 MG tablet Commonly known as: DELTASONE        TAKE these medications    albuterol  108 (90 Base) MCG/ACT inhaler Commonly known as: VENTOLIN  HFA Inhale 2 puffs into the lungs every 6 (six) hours as needed for wheezing or shortness of breath.   allopurinol  100 MG tablet Commonly known as: ZYLOPRIM  Take 200 mg by mouth daily.   amLODipine  10 MG tablet Commonly known as: NORVASC  Take 10 mg by mouth daily.   benazepril  40 MG tablet Commonly known as: LOTENSIN  Take 40 mg by mouth daily.   Carboxymethylcellulose Sod PF 0.5 % Soln Place 1 drop into both eyes 4 (four) times daily as needed (for dryness).   carvedilol  12.5 MG tablet Commonly known as: COREG  Take 12.5 mg by mouth 2 (two) times daily with a meal.   cetirizine  10 MG tablet Commonly known as: ZYRTEC  TAKE 1 TABLET(10 MG) BY MOUTH DAILY   clopidogrel  75 MG tablet Commonly known as: PLAVIX  Take 75 mg by mouth daily.   DULoxetine  30 MG capsule Commonly known as: CYMBALTA  Take 30 mg by mouth in the morning.   fluticasone  50 MCG/ACT nasal spray Commonly known as: FLONASE  Place 2 sprays into both nostrils daily. What changed: when to take this   furosemide  20 MG tablet Commonly known as: LASIX  Take 20 mg by mouth daily.   insulin  glargine 100 UNIT/ML injection Commonly known as: LANTUS  Inject 30 Units into the skin in the morning.   ipratropium-albuterol  0.5-2.5 (3)  MG/3ML Soln Commonly known as: DUONEB Take 3 mLs by nebulization every 6 (six) hours as needed (for shortness of breath or wheezing).   Combivent Respimat 20-100 MCG/ACT Aers respimat Generic drug: Ipratropium-Albuterol  Inhale 1 puff into the lungs every 6 (six) hours as needed for wheezing or shortness of breath.   Jardiance  25 MG Tabs tablet Generic drug: empagliflozin  Take 25 mg by mouth daily.   latanoprost  0.005 % ophthalmic solution Commonly known as: XALATAN  Place 1 drop into both eyes at bedtime.   levothyroxine  125 MCG tablet Commonly known as: SYNTHROID  Take 125 mcg by mouth daily before breakfast.   metFORMIN  500 MG tablet Commonly known as: GLUCOPHAGE  Take 500 mg by mouth 2 (two) times daily with a meal.   montelukast  10 MG tablet Commonly known as: SINGULAIR  TAKE 1 TABLET(10 MG) BY MOUTH AT BEDTIME   multivitamin with minerals Tabs tablet Take 1 tablet by mouth daily with breakfast.   mupirocin ointment 2 % Commonly known as: BACTROBAN Apply 1 Application topically See admin instructions. Apply to the  affected area daily as directed   NovoLOG  FlexPen 100 UNIT/ML FlexPen Generic drug: insulin  aspart Inject 30 Units into the skin 3 (three) times daily before meals.   pregabalin 100 MG capsule Commonly known as: LYRICA Take 100 mg by mouth 2 (two) times daily.   rOPINIRole  0.25 MG tablet Commonly known as: REQUIP  Take 0.25 mg by mouth at bedtime.   rosuvastatin  40 MG tablet Commonly known as: CRESTOR  Take 20 mg by mouth in the morning.   sulfamethoxazole -trimethoprim  800-160 MG tablet Commonly known as: Bactrim  DS Take 1 tablet by mouth 2 (two) times daily for 7 days.   tamsulosin  0.4 MG Caps capsule Commonly known as: FLOMAX  Take 0.4 mg by mouth in the morning and at bedtime.        Follow-up Information     [facility] Follow up in 1 week(s).   Contact information: [address] [facility] [place]  [address] [phone]                Allergies  Allergen Reactions   Eszopiclone Hives and Itching   Niacin Hives   Niaspan [Niacin Er (Antihyperlipidemic)] Nausea And Vomiting and Rash   Ozempic (0.25 Or 0.5 Mg-Dose) [Semaglutide(0.25 Or 0.5mg -Dos)] Hives and Nausea And Vomiting    Consultations: None   Procedures/Studies: VAS US  ABI WITH/WO TBI Result Date: 07/12/2024  LOWER EXTREMITY DOPPLER STUDY Patient Name:  Sean Saunders  Date of Exam:   07/12/2024 Medical Rec #: [id number]        Accession #:    [id number] Date of Birth: [birth year]        Patient Gender: M Patient Age:   80 years Exam Location:  [facility] Procedure:      VAS US  ABI WITH/WO TBI Referring Phys: --------------------------------------------------------------------------------  Indications: Peripheral artery disease. Cellulitis and right lower extremity              wound infection X 1 month High Risk Factors: Hypertension, hyperlipidemia, Diabetes, current smoker, prior                    CVA. Other Factors: CKD III.  Vascular Interventions: Left fem-pop bypass 07/03/2018. Comparison Study: Prior ABI done 04/18/2023 Performing Technologist: Rachel Pellet RVS  Examination Guidelines: A complete evaluation includes at minimum, Doppler waveform signals and systolic blood pressure reading at the level of bilateral brachial, anterior tibial, and posterior tibial arteries, when vessel segments are accessible. Bilateral testing is considered an integral part of a complete examination. Photoelectric Plethysmograph (PPG) waveforms and toe systolic pressure readings are included as required and additional duplex testing as needed. Limited examinations for reoccurring indications may be performed as noted.  ABI Findings: +---------+------------------+-----+-----------+--------+ Right    Rt Pressure (mmHg)IndexWaveform   Comment  +---------+------------------+-----+-----------+--------+ Brachial 179                     triphasic           +---------+------------------+-----+-----------+--------+ PTA      179               1.00 multiphasic         +---------+------------------+-----+-----------+--------+ DP       175               0.98 multiphasic         +---------+------------------+-----+-----------+--------+ Great Toe141               0.79 Normal              +---------+------------------+-----+-----------+--------+ +---------+------------------+-----+-----------+-------+  Left     Lt Pressure (mmHg)IndexWaveform   Comment +---------+------------------+-----+-----------+-------+ Brachial 179                    triphasic          +---------+------------------+-----+-----------+-------+ PTA      174               0.97 multiphasic        +---------+------------------+-----+-----------+-------+ DP       177               0.99 multiphasic        +---------+------------------+-----+-----------+-------+ Great Toe139               0.78 Normal             +---------+------------------+-----+-----------+-------+ +-------+-----------+-----------+------------+------------+ ABI/TBIToday's ABIToday's TBIPrevious ABIPrevious TBI +-------+-----------+-----------+------------+------------+ Right  1.0        0.79       1.07        0.71         +-------+-----------+-----------+------------+------------+ Left   0.99       0.78       0.96        0.69         +-------+-----------+-----------+------------+------------+ Bilateral ABIs appear essentially unchanged compared to prior study on 04/18/2023. Bilateral TBIs appear increased compared to prior study on 04/18/2023.  Summary: Right: Resting right ankle-brachial index is within normal range. The right toe-brachial index is normal. Left: Resting left ankle-brachial index is within normal range. The left toe-brachial index is normal. *See table(s) above for measurements and observations.  Electronically signed by Debby Robertson on  07/12/2024 at 4:50:08 PM.    Final    DG Tibia/Fibula Right Result Date: 07/11/2024 CLINICAL DATA:  Patient banged his shin, osteomyelitis rule out EXAM: RIGHT TIBIA AND FIBULA - 2 VIEW COMPARISON:  June 01, 2023 FINDINGS: No acute fracture or dislocation. There is no evidence of arthropathy or other focal bone abnormality. Soft tissues are unremarkable. IMPRESSION: No acute fracture or dislocation. No radiographic findings of osteomyelitis, at this time. Electronically Signed   By: Rogelia Myers M.D.   On: 07/11/2024 16:31     Discharge Exam: Vitals:   07/12/24 2054 07/13/24 0645  BP: (!) 180/71 (!) 163/82  Pulse: 73 68  Resp: 16 14  Temp: 98.3 F (36.8 C) 97.7 F (36.5 C)  SpO2: 95% 96%   Vitals:   07/12/24 1803 07/12/24 1815 07/12/24 2054 07/13/24 0645  BP: (!) 189/63 (!) 177/77 (!) 180/71 (!) 163/82  Pulse: 76 74 73 68  Resp: 18 18 16 14   Temp:   98.3 F (36.8 C) 97.7 F (36.5 C)  TempSrc:   Oral Oral  SpO2: 91% 95% 95% 96%  Weight:      Height:        General: Pt is alert, awake, not in acute distress Cardiovascular: RRR, S1/S2 +, no rubs, no gallops Respiratory: CTA bilaterally, no wheezing, no rhonchi Abdominal: Soft, NT, ND, bowel sounds + Extremities: no edema, no cyanosis, very mild erythema at the right shin    The results of significant diagnostics from this hospitalization (including imaging, microbiology, ancillary and laboratory) are listed below for reference.     Microbiology: Recent Results (from the past 240 hours)  Blood culture (routine x 2)     Status: None (Preliminary result)   Collection Time: 07/11/24  9:50 PM   Specimen: BLOOD RIGHT ARM  Result Value Ref Range Status  Specimen Description   Final    BLOOD RIGHT ARM Performed at [facility], [address] [place], [place] [address]    Special Requests   Final    BOTTLES DRAWN AEROBIC AND ANAEROBIC Blood Culture results may not be optimal due to an inadequate volume of blood  received in culture bottles Performed at [facility], [address], [place], [place] [address]    Culture   Final    NO GROWTH 1 DAY Performed at [facility], [address] [place], [place] [address]    Report Status PENDING  Incomplete  Blood culture (routine x 2)     Status: None (Preliminary result)   Collection Time: 07/11/24  9:50 PM   Specimen: BLOOD RIGHT HAND  Result Value Ref Range Status   Specimen Description   Final    BLOOD RIGHT HAND Performed at [facility], [address], [place], [place] [address]    Special Requests   Final    BOTTLES DRAWN AEROBIC AND ANAEROBIC Blood Culture results may not be optimal due to an inadequate volume of blood received in culture bottles Performed at [facility], [address], [place], [place] [address]    Culture   Final    NO GROWTH 1 DAY Performed at [facility], [address] [place], [place] [address]    Report Status PENDING  Incomplete  MRSA Next Gen by PCR, Nasal     Status: None   Collection Time: 07/11/24 10:09 PM   Specimen: Nasal Mucosa; Nasal Swab  Result Value Ref Range Status   MRSA by PCR Next Gen NOT DETECTED NOT DETECTED Final    Comment: (NOTE) The GeneXpert MRSA Assay (FDA approved for NASAL specimens only), is one component of a comprehensive MRSA colonization surveillance program. It is not intended to diagnose MRSA infection nor to guide or monitor treatment for MRSA infections. Test performance is not FDA approved in patients less than 49 years old. Performed at [facility], [address] [place], [place] [address]      Labs: BNP (last 3 results) No results for input(s): BNP in the last 8760 hours. Basic Metabolic Panel: Recent Labs  Lab 07/11/24 1544 07/12/24 0339 07/13/24 0413  NA 137 144 143  K 4.1 4.1 4.8  CL 106 110 109  CO2 22 26 27   GLUCOSE 244* 167* 139*  BUN 24* 23 20  CREATININE 1.26*  1.27* 1.15  CALCIUM  8.8* 9.0 9.1  MG  --  2.2  --   PHOS  --  4.8*  --    Liver Function Tests: Recent Labs  Lab 07/11/24 1544 07/12/24 0339  AST 14* 13*  ALT 14 13  ALKPHOS 98 93  BILITOT 0.4 0.6  PROT 6.4* 6.1*  ALBUMIN  3.2* 2.9*   No results for input(s): LIPASE, AMYLASE in the last 168 hours. No results for input(s): AMMONIA in the last 168 hours. CBC: Recent Labs  Lab 07/11/24 1544 07/12/24 0339  WBC 8.0 6.9  NEUTROABS 4.1  --   HGB 13.5 13.0  HCT 43.3 41.6  MCV 88.7 89.8  PLT 187 165   Cardiac Enzymes: No results for input(s): CKTOTAL, CKMB, CKMBINDEX, TROPONINI in the last 168 hours. BNP: Invalid input(s): POCBNP CBG: Recent Labs  Lab 07/12/24 0737 07/12/24 1106 07/12/24 1644 07/12/24 2052 07/13/24 0716  GLUCAP 119* 178* 203* 215* 140*   D-Dimer No results for input(s): DDIMER  in the last 72 hours. Hgb A1c Recent Labs    07/11/24 2150  HGBA1C 7.8*   Lipid Profile No results for input(s): CHOL, HDL, LDLCALC, TRIG, CHOLHDL, LDLDIRECT in the last 72 hours. Thyroid  function studies No results for input(s): TSH, T4TOTAL, T3FREE, THYROIDAB in the last 72 hours.  Invalid input(s): FREET3 Anemia work up No results for input(s): VITAMINB12, FOLATE, FERRITIN, TIBC, IRON, RETICCTPCT in the last 72 hours. Urinalysis    Component Value Date/Time   COLORURINE YELLOW 06/27/2020 0547   APPEARANCEUR CLEAR 06/27/2020 0547   LABSPEC 1.013 06/27/2020 0547   PHURINE 5.0 06/27/2020 0547   GLUCOSEU >=500 (A) 06/27/2020 0547   HGBUR NEGATIVE 06/27/2020 0547   BILIRUBINUR NEGATIVE 06/27/2020 0547   KETONESUR 5 (A) 06/27/2020 0547   PROTEINUR 100 (A) 06/27/2020 0547   UROBILINOGEN 0.2 07/03/2012 0937   NITRITE NEGATIVE 06/27/2020 0547   LEUKOCYTESUR NEGATIVE 06/27/2020 0547   Sepsis Labs Recent Labs  Lab 07/11/24 1544 07/12/24 0339  WBC 8.0 6.9   Microbiology Recent Results (from the past 240 hours)   Blood culture (routine x 2)     Status: None (Preliminary result)   Collection Time: 07/11/24  9:50 PM   Specimen: BLOOD RIGHT ARM  Result Value Ref Range Status   Specimen Description   Final    BLOOD RIGHT ARM Performed at [facility], [address] [place], [place] [address]    Special Requests   Final    BOTTLES DRAWN AEROBIC AND ANAEROBIC Blood Culture results may not be optimal due to an inadequate volume of blood received in culture bottles Performed at [facility], [address], [place], [place] [address]    Culture   Final    NO GROWTH 1 DAY Performed at [facility], [address], [place], [place] [address]    Report Status PENDING  Incomplete  Blood culture (routine x 2)     Status: None (Preliminary result)   Collection Time: 07/11/24  9:50 PM   Specimen: BLOOD RIGHT HAND  Result Value Ref Range Status   Specimen Description   Final    BLOOD RIGHT HAND Performed at [facility], [address] [place], [place] [address]    Special Requests   Final    BOTTLES DRAWN AEROBIC AND ANAEROBIC Blood Culture results may not be optimal due to an inadequate volume of blood received in culture bottles Performed at [facility], [address] [place], [place] [address]    Culture   Final    NO GROWTH 1 DAY Performed at [facility], [address] [place], [place] [address]    Report Status PENDING  Incomplete  MRSA Next Gen by PCR, Nasal     Status: None   Collection Time: 07/11/24 10:09 PM   Specimen: Nasal Mucosa; Nasal Swab  Result Value Ref Range Status   MRSA by PCR Next Gen NOT DETECTED NOT DETECTED Final    Comment: (NOTE) The GeneXpert MRSA Assay (FDA approved for NASAL specimens only), is one component of a comprehensive MRSA colonization surveillance program. It is not intended to diagnose MRSA infection nor to guide or monitor treatment for MRSA infections. Test performance is not FDA  approved in patients less than 55 years old. Performed at [facility], [address] [place], [place] [address]     FURTHER DISCHARGE INSTRUCTIONS:   Get Medicines reviewed and adjusted: Please take all your medications  with you for your next visit with your Primary MD   Laboratory/radiological data: Please request your Primary MD to go over all hospital tests and procedure/radiological results at the follow up, please ask your Primary MD to get all Hospital records sent to his/her office.   In some cases, they will be blood work, cultures and biopsy results pending at the time of your discharge. Please request that your primary care M.D. goes through all the records of your hospital data and follows up on these results.   Also Note the following: If you experience worsening of your admission symptoms, develop shortness of breath, life threatening emergency, suicidal or homicidal thoughts you must seek medical attention immediately by calling 911 or calling your MD immediately  if symptoms less severe.   You must read complete instructions/literature along with all the possible adverse reactions/side effects for all the Medicines you take and that have been prescribed to you. Take any new Medicines after you have completely understood and accpet all the possible adverse reactions/side effects.    patient was instructed, not to drive, operate heavy machinery, perform activities at heights, swimming or participation in water activities or provide baby-sitting services while on Pain, Sleep and Anxiety Medications; until their outpatient Physician has advised to do so again. Also recommended to not to take more than prescribed Pain, Sleep and Anxiety Medications.  It is not advisable to combine anxiety, sleep and pain medications without talking with your primary care provider.     Wear Seat belts while driving.   Please note: You were cared for by a hospitalist during  your hospital stay. Once you are discharged, your primary care physician will handle any further medical issues. Please note that NO REFILLS for any discharge medications will be authorized once you are discharged, as it is imperative that you return to your primary care physician (or establish a relationship with a primary care physician if you do not have one) for your post hospital discharge needs so that they can reassess your need for medications and monitor your lab values  Time coordinating discharge: Over 30 minutes  SIGNED:   Fredia Skeeter, MD  [facility] 07/13/2024, 10:18 AM *Please note that this is a verbal dictation therefore any spelling or grammatical errors are due to the Dragon Medical One system interpretation. If 7PM-7AM, please contact night-coverage [email or web address]

## 2024-07-13 NOTE — Progress Notes (Signed)
 OT Cancellation Note  Patient Details Name: Sean Saunders MRN: [id number] DOB: 17-Oct-1946   Cancelled Treatment:    Reason Eval/Treat Not Completed: OT screened, no needs identified, will sign off  Warrick POUR OTR/L  Acute Rehab Services  [phone] office number   Warrick Berber 07/13/2024, 10:46 AM

## 2024-07-13 NOTE — Evaluation (Signed)
 Physical Therapy Evaluation Patient Details Name: ABDOUL Saunders MRN: [id number] DOB: 02/01/1946 Today's Date: 07/13/2024  History of Present Illness  Pt admitted from home 2* R LE wound infection and with hx of COPD, DM, TIA, THR, CKD, PVD and Fem pop bypass graft  Clinical Impression  Pt admitted as above and currently demonstrating IND/Mod Ind in all basic mobility tasks including ambulating in halls - pt with widened BOS and mild instability but no LOB.  Pt states he feels he is at his baseline and comfortable dc to home with limited assist.  Pt eager for dc home this date.        If plan is discharge home, recommend the following:     Can travel by private vehicle        Equipment Recommendations None recommended by PT  Recommendations for Other Services       Functional Status Assessment Patient has not had a recent decline in their functional status     Precautions / Restrictions Precautions Precautions: Fall Recall of Precautions/Restrictions: Intact      Mobility  Bed Mobility Overal bed mobility: Modified Independent                  Transfers Overall transfer level: Modified independent Equipment used: None               General transfer comment: Wide BOS but no physical assist    Ambulation/Gait Ambulation/Gait assistance: Supervision, Independent Gait Distance (Feet): 350 Feet Assistive device: None Gait Pattern/deviations: Wide base of support, WFL(Within Functional Limits), Shuffle Gait velocity: mod pace     General Gait Details: increased BOS, noted instability with occasional reach for railing in hallway; No LOB  Stairs            Wheelchair Mobility     Tilt Bed    Modified Rankin (Stroke Patients Only)       Balance Overall balance assessment: Mild deficits observed, not formally tested                                           Pertinent Vitals/Pain Pain Assessment Pain Assessment:  No/denies pain    Home Living Family/patient expects to be discharged to:: Private residence Living Arrangements: Alone   Type of Home: House Home Access: Level entry       Home Layout: One level Home Equipment: Agricultural consultant (2 wheels);Cane - single point Additional Comments: neighbor checks on    Prior Function Prior Level of Function : Independent/Modified Independent                     Extremity/Trunk Assessment   Upper Extremity Assessment Upper Extremity Assessment: Overall WFL for tasks assessed    Lower Extremity Assessment Lower Extremity Assessment: Overall WFL for tasks assessed    Cervical / Trunk Assessment Cervical / Trunk Assessment: Normal  Communication   Communication Communication: No apparent difficulties    Cognition Arousal: Alert Behavior During Therapy: WFL for tasks assessed/performed                             Following commands: Intact       Cueing Cueing Techniques: Verbal cues     General Comments      Exercises     Assessment/Plan    PT Assessment Patient  needs continued PT services  PT Problem List Decreased balance;Decreased activity tolerance       PT Treatment Interventions Gait training;Functional mobility training;Balance training    PT Goals (Current goals can be found in the Care Plan section)  Acute Rehab PT Goals Patient Stated Goal: HOME`    Frequency Min 1X/week     Co-evaluation               AM-PAC PT 6 Clicks Mobility  Outcome Measure Help needed turning from your back to your side while in a flat bed without using bedrails?: None Help needed moving from lying on your back to sitting on the side of a flat bed without using bedrails?: None Help needed moving to and from a bed to a chair (including a wheelchair)?: None Help needed standing up from a chair using your arms (e.g., wheelchair or bedside chair)?: None Help needed to walk in hospital room?: None Help needed  climbing 3-5 steps with a railing? : A Little 6 Click Score: 23    End of Session Equipment Utilized During Treatment: Gait belt Activity Tolerance: Patient tolerated treatment well Patient left: in chair;with call bell/phone within reach Nurse Communication: Mobility status PT Visit Diagnosis: Difficulty in walking, not elsewhere classified (R26.2)    Time: 8994-8974 PT Time Calculation (min) (ACUTE ONLY): 20 min   Charges:   PT Evaluation $PT Eval Low Complexity: 1 Low   PT General Charges $$ ACUTE PT VISIT: 1 Visit         [facility] PT Acute Rehabilitation Services Office [phone]   Quavon Keisling 07/13/2024, 12:13 PM

## 2024-07-17 LAB — CULTURE, BLOOD (ROUTINE X 2)
Culture: NO GROWTH
Culture: NO GROWTH

## 2024-09-20 ENCOUNTER — Ambulatory Visit: Admitting: Podiatry

## 2024-09-30 ENCOUNTER — Ambulatory Visit (INDEPENDENT_AMBULATORY_CARE_PROVIDER_SITE_OTHER): Admitting: Podiatry

## 2024-09-30 DIAGNOSIS — M79672 Pain in left foot: Secondary | ICD-10-CM | POA: Diagnosis not present

## 2024-09-30 DIAGNOSIS — B351 Tinea unguium: Secondary | ICD-10-CM | POA: Diagnosis not present

## 2024-09-30 DIAGNOSIS — M79671 Pain in right foot: Secondary | ICD-10-CM | POA: Diagnosis not present

## 2024-09-30 NOTE — Progress Notes (Signed)
 Patient presents for evaluation and treatment of tenderness and some redness around nails feet.  Tenderness around toes with walking and wearing shoes.  Physical exam:  General appearance: Alert, pleasant, and in no acute distress.  Vascular: Pedal pulses: DP 2/4 B/L, PT 0/4 B/L. Moderate edema lower legs bilaterally  Neu  Dermatologic:  Nails thickened, disfigured, discolored 1-5 BL with subungual debris.  Redness and hypertrophic nail folds along nail folds bilaterally but no signs of drainage or infection.  Musculoskeletal:     Diagnosis: 1. Painful onychomycotic nails 1 through 5 bilaterally. 2. Pain toes 1 through 5 bilaterally.  Plan: -Debrided onychomycotic nails 1 through 5 bilaterally.  Sharply debrided nails with nail clipper and reduced with a power bur.  Return 3 months [facility]

## 2024-10-28 DIAGNOSIS — R21 Rash and other nonspecific skin eruption: Secondary | ICD-10-CM | POA: Diagnosis not present

## 2024-10-28 DIAGNOSIS — N481 Balanitis: Secondary | ICD-10-CM | POA: Diagnosis not present

## 2024-10-28 DIAGNOSIS — E1165 Type 2 diabetes mellitus with hyperglycemia: Secondary | ICD-10-CM | POA: Diagnosis not present

## 2024-10-29 DIAGNOSIS — L570 Actinic keratosis: Secondary | ICD-10-CM | POA: Diagnosis not present

## 2024-10-29 DIAGNOSIS — Z85828 Personal history of other malignant neoplasm of skin: Secondary | ICD-10-CM | POA: Diagnosis not present

## 2024-10-29 DIAGNOSIS — D225 Melanocytic nevi of trunk: Secondary | ICD-10-CM | POA: Diagnosis not present

## 2024-10-29 DIAGNOSIS — L821 Other seborrheic keratosis: Secondary | ICD-10-CM | POA: Diagnosis not present

## 2024-10-29 DIAGNOSIS — L814 Other melanin hyperpigmentation: Secondary | ICD-10-CM | POA: Diagnosis not present

## 2024-11-19 ENCOUNTER — Encounter: Payer: Self-pay | Admitting: Gastroenterology

## 2024-12-31 ENCOUNTER — Ambulatory Visit: Admitting: Podiatry

## 2024-12-31 DIAGNOSIS — M79671 Pain in right foot: Secondary | ICD-10-CM

## 2024-12-31 DIAGNOSIS — B351 Tinea unguium: Secondary | ICD-10-CM | POA: Diagnosis not present

## 2024-12-31 DIAGNOSIS — M79672 Pain in left foot: Secondary | ICD-10-CM

## 2024-12-31 NOTE — Progress Notes (Signed)
 Patient presents for evaluation and treatment of tenderness and some redness around nails feet.  Tenderness around toes with walking and wearing shoes.  Physical exam:  General appearance: Alert, pleasant, and in no acute distress.  Vascular: Pedal pulses: DP 0/4 B/L, PT 0/4 B/L. Moderate edema lower legs bilaterally.  Capillary refill time immediate bilaterally  Neurologic:  Dermatologic:  Nails thickened, disfigured, discolored 1-5 BL with subungual debris.  Redness and hypertrophic nail folds along nail folds bilaterally but no signs of drainage or infection.  Musculoskeletal:     Diagnosis: 1. Painful onychomycotic nails 1 through 5 bilaterally. 2. Pain toes 1 through 5 bilaterally.  Plan: -Debrided onychomycotic nails 1 through 5 bilaterally.  Sharply debrided nails with nail clipper and reduced with a power bur.  Return 3 months [facility]

## 2025-03-31 ENCOUNTER — Ambulatory Visit: Admitting: Podiatry
